# Patient Record
Sex: Female | Born: 1960 | Race: White | Hispanic: No | Smoking: Never smoker
Health system: Southern US, Community
[De-identification: ages and names within clinical notes are randomized; demographics above are authoritative.]

## PROBLEM LIST (undated history)

## (undated) DIAGNOSIS — R112 Nausea with vomiting, unspecified: Secondary | ICD-10-CM

## (undated) DIAGNOSIS — E559 Vitamin D deficiency, unspecified: Secondary | ICD-10-CM

## (undated) DIAGNOSIS — L8990 Pressure ulcer of unspecified site, unspecified stage: Secondary | ICD-10-CM

## (undated) DIAGNOSIS — J42 Unspecified chronic bronchitis: Secondary | ICD-10-CM

## (undated) DIAGNOSIS — I10 Essential (primary) hypertension: Secondary | ICD-10-CM

## (undated) DIAGNOSIS — J690 Pneumonitis due to inhalation of food and vomit: Secondary | ICD-10-CM

## (undated) DIAGNOSIS — F329 Major depressive disorder, single episode, unspecified: Secondary | ICD-10-CM

## (undated) DIAGNOSIS — F4310 Post-traumatic stress disorder, unspecified: Secondary | ICD-10-CM

## (undated) DIAGNOSIS — E7800 Pure hypercholesterolemia, unspecified: Secondary | ICD-10-CM

## (undated) DIAGNOSIS — Z8719 Personal history of other diseases of the digestive system: Secondary | ICD-10-CM

## (undated) DIAGNOSIS — K760 Fatty (change of) liver, not elsewhere classified: Secondary | ICD-10-CM

## (undated) DIAGNOSIS — D649 Anemia, unspecified: Secondary | ICD-10-CM

## (undated) DIAGNOSIS — M545 Low back pain, unspecified: Secondary | ICD-10-CM

## (undated) DIAGNOSIS — Z87442 Personal history of urinary calculi: Secondary | ICD-10-CM

## (undated) DIAGNOSIS — E872 Acidosis: Secondary | ICD-10-CM

## (undated) DIAGNOSIS — G8929 Other chronic pain: Secondary | ICD-10-CM

## (undated) DIAGNOSIS — M1990 Unspecified osteoarthritis, unspecified site: Secondary | ICD-10-CM

## (undated) DIAGNOSIS — K5790 Diverticulosis of intestine, part unspecified, without perforation or abscess without bleeding: Secondary | ICD-10-CM

## (undated) DIAGNOSIS — M810 Age-related osteoporosis without current pathological fracture: Secondary | ICD-10-CM

## (undated) DIAGNOSIS — K219 Gastro-esophageal reflux disease without esophagitis: Secondary | ICD-10-CM

## (undated) DIAGNOSIS — Q211 Atrial septal defect, unspecified: Secondary | ICD-10-CM

## (undated) DIAGNOSIS — T7840XA Allergy, unspecified, initial encounter: Secondary | ICD-10-CM

## (undated) DIAGNOSIS — F32A Depression, unspecified: Secondary | ICD-10-CM

## (undated) DIAGNOSIS — I2699 Other pulmonary embolism without acute cor pulmonale: Secondary | ICD-10-CM

## (undated) DIAGNOSIS — H9190 Unspecified hearing loss, unspecified ear: Secondary | ICD-10-CM

## (undated) DIAGNOSIS — J40 Bronchitis, not specified as acute or chronic: Secondary | ICD-10-CM

## (undated) DIAGNOSIS — T50901A Poisoning by unspecified drugs, medicaments and biological substances, accidental (unintentional), initial encounter: Secondary | ICD-10-CM

## (undated) DIAGNOSIS — N950 Postmenopausal bleeding: Secondary | ICD-10-CM

## (undated) DIAGNOSIS — M797 Fibromyalgia: Secondary | ICD-10-CM

## (undated) DIAGNOSIS — D61818 Other pancytopenia: Secondary | ICD-10-CM

## (undated) DIAGNOSIS — I700 Atherosclerosis of aorta: Secondary | ICD-10-CM

## (undated) DIAGNOSIS — I469 Cardiac arrest, cause unspecified: Secondary | ICD-10-CM

## (undated) DIAGNOSIS — J189 Pneumonia, unspecified organism: Secondary | ICD-10-CM

## (undated) DIAGNOSIS — F331 Major depressive disorder, recurrent, moderate: Secondary | ICD-10-CM

## (undated) DIAGNOSIS — K2970 Gastritis, unspecified, without bleeding: Secondary | ICD-10-CM

## (undated) DIAGNOSIS — D509 Iron deficiency anemia, unspecified: Secondary | ICD-10-CM

## (undated) DIAGNOSIS — G4730 Sleep apnea, unspecified: Secondary | ICD-10-CM

## (undated) DIAGNOSIS — G039 Meningitis, unspecified: Secondary | ICD-10-CM

## (undated) DIAGNOSIS — R579 Shock, unspecified: Secondary | ICD-10-CM

## (undated) DIAGNOSIS — R55 Syncope and collapse: Secondary | ICD-10-CM

## (undated) DIAGNOSIS — I2510 Atherosclerotic heart disease of native coronary artery without angina pectoris: Secondary | ICD-10-CM

## (undated) DIAGNOSIS — R1310 Dysphagia, unspecified: Secondary | ICD-10-CM

## (undated) DIAGNOSIS — J9601 Acute respiratory failure with hypoxia: Secondary | ICD-10-CM

## (undated) DIAGNOSIS — IMO0001 Reserved for inherently not codable concepts without codable children: Secondary | ICD-10-CM

## (undated) HISTORY — DX: Dysphagia, unspecified: R13.10

## (undated) HISTORY — DX: Allergy, unspecified, initial encounter: T78.40XA

## (undated) HISTORY — DX: Age-related osteoporosis without current pathological fracture: M81.0

## (undated) HISTORY — DX: Iron deficiency anemia, unspecified: D50.9

## (undated) HISTORY — DX: Nausea with vomiting, unspecified: R11.2

## (undated) HISTORY — DX: Major depressive disorder, single episode, unspecified: F32.9

## (undated) HISTORY — DX: Unspecified hearing loss, unspecified ear: H91.90

## (undated) HISTORY — DX: Atherosclerosis of aorta: I70.0

## (undated) HISTORY — PX: TYMPANOSTOMY TUBE PLACEMENT: SHX32

## (undated) HISTORY — DX: Reserved for inherently not codable concepts without codable children: IMO0001

## (undated) HISTORY — DX: Shock, unspecified: R57.9

## (undated) HISTORY — PX: APPENDECTOMY: SHX54

## (undated) HISTORY — DX: Pressure ulcer of unspecified site, unspecified stage: L89.90

## (undated) HISTORY — DX: Other pulmonary embolism without acute cor pulmonale: I26.99

## (undated) HISTORY — DX: Depression, unspecified: F32.A

## (undated) HISTORY — DX: Vitamin D deficiency, unspecified: E55.9

## (undated) HISTORY — DX: Other pancytopenia: D61.818

## (undated) HISTORY — DX: Postmenopausal bleeding: N95.0

## (undated) HISTORY — DX: Unspecified osteoarthritis, unspecified site: M19.90

## (undated) HISTORY — DX: Fibromyalgia: M79.7

## (undated) HISTORY — PX: TONSILLECTOMY AND ADENOIDECTOMY: SUR1326

## (undated) HISTORY — PX: EYE MUSCLE SURGERY: SHX370

## (undated) HISTORY — DX: Bronchitis, not specified as acute or chronic: J40

## (undated) HISTORY — DX: Acidosis: E87.2

## (undated) HISTORY — DX: Fatty (change of) liver, not elsewhere classified: K76.0

## (undated) HISTORY — DX: Diverticulosis of intestine, part unspecified, without perforation or abscess without bleeding: K57.90

## (undated) HISTORY — DX: Poisoning by unspecified drugs, medicaments and biological substances, accidental (unintentional), initial encounter: T50.901A

## (undated) HISTORY — DX: Syncope and collapse: R55

## (undated) HISTORY — DX: Major depressive disorder, recurrent, moderate: F33.1

## (undated) HISTORY — DX: Pneumonitis due to inhalation of food and vomit: J69.0

## (undated) HISTORY — DX: Sleep apnea, unspecified: G47.30

## (undated) HISTORY — PX: COCHLEAR IMPLANT: SUR684

## (undated) HISTORY — DX: Cardiac arrest, cause unspecified: I46.9

## (undated) HISTORY — DX: Acute respiratory failure with hypoxia: J96.01

## (undated) HISTORY — DX: Gastro-esophageal reflux disease without esophagitis: K21.9

## (undated) HISTORY — DX: Anemia, unspecified: D64.9

---
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
## 1991-05-11 HISTORY — PX: TUBAL LIGATION: SHX77

## 1991-05-11 HISTORY — PX: INCISION AND DRAINAGE OF WOUND: SHX1803

## 1998-10-26 ENCOUNTER — Emergency Department (HOSPITAL_COMMUNITY): Admission: EM | Admit: 1998-10-26 | Discharge: 1998-10-26 | Payer: Self-pay | Admitting: *Deleted

## 1999-02-28 ENCOUNTER — Inpatient Hospital Stay (HOSPITAL_COMMUNITY): Admission: AD | Admit: 1999-02-28 | Discharge: 1999-02-28 | Payer: Self-pay | Admitting: *Deleted

## 1999-03-08 ENCOUNTER — Inpatient Hospital Stay (HOSPITAL_COMMUNITY): Admission: EM | Admit: 1999-03-08 | Discharge: 1999-03-11 | Payer: Self-pay | Admitting: Emergency Medicine

## 1999-03-26 ENCOUNTER — Encounter: Admission: RE | Admit: 1999-03-26 | Discharge: 1999-03-26 | Payer: Self-pay | Admitting: Family Medicine

## 1999-04-13 ENCOUNTER — Ambulatory Visit (HOSPITAL_COMMUNITY): Admission: RE | Admit: 1999-04-13 | Discharge: 1999-04-13 | Payer: Self-pay | Admitting: Cardiology

## 2001-03-08 ENCOUNTER — Other Ambulatory Visit: Admission: RE | Admit: 2001-03-08 | Discharge: 2001-03-08 | Payer: Self-pay | Admitting: Gynecology

## 2001-05-10 DIAGNOSIS — I2699 Other pulmonary embolism without acute cor pulmonale: Secondary | ICD-10-CM

## 2001-05-10 HISTORY — DX: Other pulmonary embolism without acute cor pulmonale: I26.99

## 2001-05-30 ENCOUNTER — Ambulatory Visit (HOSPITAL_COMMUNITY): Admission: RE | Admit: 2001-05-30 | Discharge: 2001-05-30 | Payer: Self-pay | Admitting: Gynecology

## 2001-05-30 ENCOUNTER — Encounter: Payer: Self-pay | Admitting: Gynecology

## 2001-06-21 ENCOUNTER — Encounter: Admission: RE | Admit: 2001-06-21 | Discharge: 2001-09-19 | Payer: Self-pay | Admitting: Gynecology

## 2001-09-29 ENCOUNTER — Ambulatory Visit (HOSPITAL_COMMUNITY): Admission: EM | Admit: 2001-09-29 | Discharge: 2001-09-29 | Payer: Self-pay | Admitting: *Deleted

## 2002-06-08 ENCOUNTER — Other Ambulatory Visit: Admission: RE | Admit: 2002-06-08 | Discharge: 2002-06-08 | Payer: Self-pay | Admitting: Gynecology

## 2005-05-10 HISTORY — PX: MITRAL VALVE REPAIR: SHX2039

## 2006-09-16 ENCOUNTER — Emergency Department (HOSPITAL_COMMUNITY): Admission: EM | Admit: 2006-09-16 | Discharge: 2006-09-16 | Payer: Self-pay | Admitting: Emergency Medicine

## 2007-02-13 ENCOUNTER — Other Ambulatory Visit: Payer: Self-pay

## 2007-02-13 ENCOUNTER — Other Ambulatory Visit: Payer: Self-pay | Admitting: Emergency Medicine

## 2007-02-13 ENCOUNTER — Ambulatory Visit: Payer: Self-pay | Admitting: *Deleted

## 2007-02-13 ENCOUNTER — Inpatient Hospital Stay (HOSPITAL_COMMUNITY): Admission: RE | Admit: 2007-02-13 | Discharge: 2007-02-17 | Payer: Self-pay | Admitting: *Deleted

## 2007-08-24 ENCOUNTER — Emergency Department (HOSPITAL_COMMUNITY): Admission: EM | Admit: 2007-08-24 | Discharge: 2007-08-25 | Payer: Self-pay | Admitting: Emergency Medicine

## 2007-10-10 ENCOUNTER — Encounter: Payer: Self-pay | Admitting: Gastroenterology

## 2007-10-10 ENCOUNTER — Other Ambulatory Visit: Admission: RE | Admit: 2007-10-10 | Discharge: 2007-10-10 | Payer: Self-pay | Admitting: Obstetrics and Gynecology

## 2007-10-24 ENCOUNTER — Ambulatory Visit: Payer: Self-pay | Admitting: Gastroenterology

## 2007-11-01 ENCOUNTER — Telehealth (INDEPENDENT_AMBULATORY_CARE_PROVIDER_SITE_OTHER): Payer: Self-pay | Admitting: *Deleted

## 2007-11-01 ENCOUNTER — Telehealth: Payer: Self-pay | Admitting: Gastroenterology

## 2007-11-03 ENCOUNTER — Ambulatory Visit: Payer: Self-pay | Admitting: Gastroenterology

## 2007-12-24 ENCOUNTER — Emergency Department (HOSPITAL_COMMUNITY): Admission: EM | Admit: 2007-12-24 | Discharge: 2007-12-24 | Payer: Self-pay | Admitting: Emergency Medicine

## 2008-01-15 ENCOUNTER — Emergency Department (HOSPITAL_COMMUNITY): Admission: EM | Admit: 2008-01-15 | Discharge: 2008-01-15 | Payer: Self-pay | Admitting: Emergency Medicine

## 2008-01-24 ENCOUNTER — Emergency Department (HOSPITAL_COMMUNITY): Admission: EM | Admit: 2008-01-24 | Discharge: 2008-01-24 | Payer: Self-pay | Admitting: Emergency Medicine

## 2008-01-25 ENCOUNTER — Ambulatory Visit: Payer: Self-pay | Admitting: Psychiatry

## 2008-01-25 ENCOUNTER — Other Ambulatory Visit (HOSPITAL_COMMUNITY): Admission: RE | Admit: 2008-01-25 | Discharge: 2008-04-24 | Payer: Self-pay | Admitting: Psychiatry

## 2008-02-20 ENCOUNTER — Ambulatory Visit: Payer: Self-pay | Admitting: Obstetrics and Gynecology

## 2008-02-20 ENCOUNTER — Encounter: Payer: Self-pay | Admitting: Obstetrics and Gynecology

## 2008-04-23 ENCOUNTER — Emergency Department (HOSPITAL_COMMUNITY): Admission: EM | Admit: 2008-04-23 | Discharge: 2008-04-23 | Payer: Self-pay | Admitting: Emergency Medicine

## 2008-05-29 ENCOUNTER — Ambulatory Visit (HOSPITAL_COMMUNITY): Payer: Self-pay | Admitting: Psychiatry

## 2008-06-26 ENCOUNTER — Ambulatory Visit (HOSPITAL_COMMUNITY): Payer: Self-pay | Admitting: Psychiatry

## 2008-07-24 ENCOUNTER — Emergency Department (HOSPITAL_COMMUNITY): Admission: EM | Admit: 2008-07-24 | Discharge: 2008-07-24 | Payer: Self-pay | Admitting: Emergency Medicine

## 2008-08-21 ENCOUNTER — Ambulatory Visit (HOSPITAL_COMMUNITY): Payer: Self-pay | Admitting: Psychiatry

## 2008-09-18 ENCOUNTER — Ambulatory Visit (HOSPITAL_COMMUNITY): Payer: Self-pay | Admitting: Psychiatry

## 2008-10-15 ENCOUNTER — Ambulatory Visit (HOSPITAL_COMMUNITY): Payer: Self-pay | Admitting: Psychiatry

## 2008-11-15 ENCOUNTER — Ambulatory Visit: Payer: Self-pay | Admitting: Obstetrics and Gynecology

## 2008-11-15 ENCOUNTER — Other Ambulatory Visit: Admission: RE | Admit: 2008-11-15 | Discharge: 2008-11-15 | Payer: Self-pay | Admitting: Obstetrics and Gynecology

## 2008-12-04 ENCOUNTER — Ambulatory Visit (HOSPITAL_COMMUNITY): Payer: Self-pay | Admitting: Psychiatry

## 2009-02-28 ENCOUNTER — Ambulatory Visit (HOSPITAL_COMMUNITY): Payer: Self-pay | Admitting: Psychiatry

## 2009-03-10 ENCOUNTER — Encounter: Admission: RE | Admit: 2009-03-10 | Discharge: 2009-03-26 | Payer: Self-pay | Admitting: Family Medicine

## 2009-04-02 ENCOUNTER — Ambulatory Visit (HOSPITAL_COMMUNITY): Payer: Self-pay | Admitting: Psychiatry

## 2009-05-28 ENCOUNTER — Ambulatory Visit (HOSPITAL_COMMUNITY): Payer: Self-pay | Admitting: Psychiatry

## 2009-07-22 ENCOUNTER — Ambulatory Visit (HOSPITAL_COMMUNITY): Payer: Self-pay | Admitting: Psychiatry

## 2009-09-16 ENCOUNTER — Ambulatory Visit (HOSPITAL_COMMUNITY): Payer: Self-pay | Admitting: Psychiatry

## 2009-12-02 ENCOUNTER — Ambulatory Visit: Payer: Self-pay | Admitting: Obstetrics and Gynecology

## 2009-12-31 ENCOUNTER — Ambulatory Visit (HOSPITAL_COMMUNITY): Payer: Self-pay | Admitting: Psychiatry

## 2010-01-23 ENCOUNTER — Ambulatory Visit (HOSPITAL_COMMUNITY): Payer: Self-pay | Admitting: Psychiatry

## 2010-05-10 HISTORY — PX: LAPAROSCOPIC CHOLECYSTECTOMY: SUR755

## 2010-05-20 ENCOUNTER — Ambulatory Visit (HOSPITAL_COMMUNITY)
Admission: RE | Admit: 2010-05-20 | Discharge: 2010-05-20 | Payer: Self-pay | Source: Home / Self Care | Attending: Psychiatry | Admitting: Psychiatry

## 2010-07-26 ENCOUNTER — Emergency Department (HOSPITAL_COMMUNITY): Payer: Medicare Other

## 2010-07-26 ENCOUNTER — Inpatient Hospital Stay (HOSPITAL_COMMUNITY)
Admission: EM | Admit: 2010-07-26 | Discharge: 2010-07-29 | DRG: 419 | Disposition: A | Discharge status: Home / Self Care | Payer: Medicare Other | Attending: Internal Medicine | Admitting: Internal Medicine

## 2010-07-26 DIAGNOSIS — H9190 Unspecified hearing loss, unspecified ear: Secondary | ICD-10-CM | POA: Diagnosis present

## 2010-07-26 DIAGNOSIS — F411 Generalized anxiety disorder: Secondary | ICD-10-CM | POA: Diagnosis present

## 2010-07-26 DIAGNOSIS — F3289 Other specified depressive episodes: Secondary | ICD-10-CM | POA: Diagnosis present

## 2010-07-26 DIAGNOSIS — IMO0001 Reserved for inherently not codable concepts without codable children: Secondary | ICD-10-CM | POA: Diagnosis present

## 2010-07-26 DIAGNOSIS — Z7982 Long term (current) use of aspirin: Secondary | ICD-10-CM

## 2010-07-26 DIAGNOSIS — Z86711 Personal history of pulmonary embolism: Secondary | ICD-10-CM

## 2010-07-26 DIAGNOSIS — E860 Dehydration: Secondary | ICD-10-CM | POA: Diagnosis present

## 2010-07-26 DIAGNOSIS — F329 Major depressive disorder, single episode, unspecified: Secondary | ICD-10-CM | POA: Diagnosis present

## 2010-07-26 DIAGNOSIS — K8010 Calculus of gallbladder with chronic cholecystitis without obstruction: Principal | ICD-10-CM | POA: Diagnosis present

## 2010-07-26 DIAGNOSIS — Z79899 Other long term (current) drug therapy: Secondary | ICD-10-CM

## 2010-07-26 LAB — COMPREHENSIVE METABOLIC PANEL
ALT: 18 U/L (ref 0–35)
AST: 38 U/L — ABNORMAL HIGH (ref 0–37)
Albumin: 4 g/dL (ref 3.5–5.2)
Alkaline Phosphatase: 134 U/L — ABNORMAL HIGH (ref 39–117)
BUN: 16 mg/dL (ref 6–23)
CO2: 25 mEq/L (ref 19–32)
Calcium: 9 mg/dL (ref 8.4–10.5)
Chloride: 103 mEq/L (ref 96–112)
Creatinine, Ser: 0.93 mg/dL (ref 0.4–1.2)
GFR calc Af Amer: >60 mL/min (ref >60)
GFR calc non Af Amer: >60 mL/min (ref >60)
Glucose, Bld: 100 mg/dL — ABNORMAL HIGH (ref 70–99)
Potassium: 3.8 mEq/L (ref 3.5–5.1)
Sodium: 140 mEq/L (ref 135–145)
Total Bilirubin: 1.4 mg/dL — ABNORMAL HIGH (ref 0.3–1.2)
Total Protein: 7.1 g/dL (ref 6.0–8.3)

## 2010-07-26 LAB — URINE MICROSCOPIC-ADD ON

## 2010-07-26 LAB — LIPASE, BLOOD: Lipase: 43 U/L (ref 11–59)

## 2010-07-26 LAB — URINALYSIS, ROUTINE W REFLEX MICROSCOPIC
Bilirubin Urine: NEGATIVE
Ketones, ur: NEGATIVE mg/dL
Nitrite: NEGATIVE
pH: 6.5 (ref 5.0–8.0)

## 2010-07-27 LAB — DIFFERENTIAL
Basophils Absolute: 0 10*3/uL (ref 0.0–0.1)
Basophils Relative: 1 % (ref 0–1)
Neutro Abs: 1.7 10*3/uL (ref 1.7–7.7)
Neutrophils Relative %: 61 % (ref 43–77)

## 2010-07-27 LAB — COMPREHENSIVE METABOLIC PANEL
ALT: 16 U/L (ref 0–35)
AST: 30 U/L (ref 0–37)
CO2: 23 mEq/L (ref 19–32)
Chloride: 108 mEq/L (ref 96–112)
GFR calc Af Amer: >60 mL/min (ref >60)
GFR calc non Af Amer: >60 mL/min (ref >60)
Sodium: 137 mEq/L (ref 135–145)
Total Bilirubin: 0.6 mg/dL (ref 0.3–1.2)

## 2010-07-27 LAB — CBC
Hemoglobin: 11.9 g/dL — ABNORMAL LOW (ref 12.0–15.0)
RBC: 3.7 MIL/uL — ABNORMAL LOW (ref 3.87–5.11)
WBC: 2.8 10*3/uL — ABNORMAL LOW (ref 4.0–10.5)

## 2010-07-28 ENCOUNTER — Other Ambulatory Visit: Payer: Self-pay | Admitting: General Surgery

## 2010-07-28 ENCOUNTER — Inpatient Hospital Stay (HOSPITAL_COMMUNITY): Payer: Medicare Other

## 2010-07-29 NOTE — Op Note (Signed)
NAMECHONG, JANUARY                 ACCOUNT NO.:  [id number]  MEDICAL RECORD NO.:  [id number]           PATIENT TYPE:  I  LOCATION:  5120                         FACILITY:  [facility]  PHYSICIAN:  Gabrielle Dare. Janee Morn, M.D.DATE OF BIRTH:  Mar 17, 1961  DATE OF PROCEDURE:  07/28/2010 DATE OF DISCHARGE:                              OPERATIVE REPORT   PREOPERATIVE DIAGNOSIS:  Cholelithiasis and likely passed common bile duct stone.  POSTOPERATIVE DIAGNOSIS:  Cholelithiasis and likely passed common bile duct stone.  PROCEDURE:  Laparoscopic cholecystectomy with intraoperative cholangiogram.  SURGEON:  Gabrielle Dare. Janee Morn, MD  ASSISTANT:  Festus Barren, PA-C  ANESTHESIA:  General endotracheal.  HISTORY OF PRESENT ILLNESS:  Ms. Krahl is a 50 year old female who was admitted with gallbladder sludge and elevated liver function tests. Liver function tests normalized and she is brought to the operating room for cholecystectomy.  PROCEDURE IN DETAIL:  Informed consent was obtained from the patient. She was identified in the preop holding area.  She is on antibiotic regimen IV.  She was brought to the operating room.  General endotracheal anesthesia was administered by the anesthesia staff.  Her abdomen was prepped and draped in sterile fashion.  Time-out procedure was done.  Infraumbilical region was infiltrated with 0.25% Marcaine with epinephrine.  Infraumbilical incision was made.  Subcutaneous tissues were dissected down revealing the anterior fascia.  This was divided sharply along the midline and peritoneal cavity was entered under direct vision without difficulty.  A 3-0 Vicryl purse-string suture was placed on the fascial opening.  The Hasson trocar was inserted into the abdomen.  The abdomen was insufflated with carbon dioxide in a standard fashion.  Under direct vision, a 5-mm epigastric and two 5-mm lateral ports were placed.  A 0.25% Marcaine with epinephrine was used at all port  sites.  The abdomen was explored. There were some adhesions in the right midabdomen we avoided with port insertion.  The gallbladder was acutely inflamed with signs of chronic inflammation and omentum stuck on the gallbladder.  The dome of gallbladder was retracted superomedially, some filmy and more dense omental adhesions were gradually taken down revealing the body and gradually the infundibulum.  The infundibulum was retracted inferolaterally.  Some further omental adhesions were swept down and the dissection began laterally, progressed medially revealing a cystic duct and a cystic artery.  The dissection continued until critical view was obtained with good visualization between the cystic duct infundibulum of the gallbladder and liver.  Next, a clip was placed on the infundibulum cystic duct junction, small nick was in the cystic duct and cholangiogram catheter was inserted.  Intraoperative cholangiogram was obtained demonstrating no common bile duct filling defects and good flow of contrast into the duodenum.  Cholangiogram catheter was removed and three clips were placed proximally on the cystic duct, and it was divided.  Further dissection revealed the cystic artery was clipped twice proximally, once distally, and divided. The gallbladder was taken off the liver bed with Bovie cautery.  We did encounter posterior branch vessel.  This was placed proximally and divided distally with cautery and gallbladder was taken the  rest of the way from liver bed with Bovie cautery achieving excellent hemostasis.  The gallbladder was placed in EndoCatch bag and removed from the abdomen via the infraumbilical port site.  The abdomen was copiously irrigated with saline.  Cautery was used to get hemostasis on the liver bed.  The liver bed was then completely dry.  Remainder of the irrigation fluid was evacuated and it was clear.  After doing a four quadrant inspection and no other abnormalities is  being noted.  Ports were removed under direct vision. The pneumoperitoneum was released.  The infraumbilical fascia was closed by tying with 0 Vicryl purse-string suture with care not to trap any intra-abdominal contents.  All four wounds were copiously irrigated, and skin of each was closed with running 4-0 subcuticular Vicryl suture followed by Dermabond.  Sponge, needle, and instrument counts were correct.  There were no apparent complications.  The patient tolerated procedure well without apparent complication and was taken to recovery room in stable condition.     Gabrielle Dare Janee Morn, M.D.     BET/MEDQ  D:  07/28/2010  T:  07/29/2010  Job:  [id number]  cc:   Jake Bathe, MD  Electronically Signed by Violeta Gelinas M.D. on 07/29/2010 04:48:11 PM

## 2010-08-06 NOTE — H&P (Signed)
[name], [name]                 ACCOUNT NO.:  [id number]  MEDICAL RECORD NO.:  [id number]           PATIENT TYPE:  I  LOCATION:  5120                         FACILITY:  [facility]  PHYSICIAN:  Abigail Miyamoto, M.D. DATE OF BIRTH:  14-Feb-1961  DATE OF ADMISSION:  07/26/2010 DATE OF DISCHARGE:                             HISTORY & PHYSICAL   CHIEF COMPLAINT:  Epigastric abdominal pain, nausea, and vomiting.  HISTORY:  This is a 50 year old female who presents with a 3-week history of epigastric abdominal pain, nausea, and vomiting.  She reports this has been intermittent.  She has difficulty related to fatty foods. She reports she has been dizzy secondary to this.  She fell secondary to this couple of weeks ago and reports she broke several ribs on her left side.  She reports she has now had minimal discomfort from the ribs, again most of her pain is epigastrium into the right upper quadrant. She has been having diarrhea with this.  There has been no hematemesis. The pain is moderate in intensity.  She is more bothered by the nausea and vomiting.  PAST MEDICAL HISTORY:  She has extensive past medical history.  She has had open heart surgery as a child and as an adult.  For child, it was atrial septal defect and as an adult, apparently it was about repair, this was done in [place].  Her history also includes anxiety, fibromyalgia, depression, hearing loss, for which she has bilateral hearing aids.  She had a pulmonary embolism during childbirth.  She has a history of opiate abuse and alcohol abuse in the past.  She has had C- section and an appendectomy.  FAMILY HISTORY:  Significant for hypertension, diabetes.  SOCIAL HISTORY:  Socially, she does denies smoking or current alcohol use.  DRUG ALLERGIES:  VALIUM, which cause an anaphylactic reaction.  MEDICATIONS:  Abilify, Coreg, hydrochlorothiazide, Pristiq, and aspirin.  REVIEW OF SYSTEMS:  GENERAL:  Negative for fever or  chills.  PULMONARY: Negative for cough, shortness of breath or difficulty breathing. CARDIAC:  Negative for current chest pain or irregular heartbeat. ABDOMEN:  Listed as above.  Again, there was no hematemesis and there was no blood in her stool.  URINARY:  Negative for dysuria or hematuria. PSYCHIATRIC:  Positive for anxiety and depression and history of substance abuse in the past.  This history of substance abuse was obtained from the chart, not from the patient.  PHYSICAL EXAMINATION:  GENERAL:  This is a well-developed and well- nourished female who appears fairly comfortable. VITAL SIGNS:  Temperature 97.2, pulse 84, temperature 16, blood pressure is 130/85. EYES:  Anicteric.  Pupils are reactive bilaterally. ENT:  External ears and nose are normal.  Hearing, she has bilateral hearing aids and has diminished hearing.  Mucus on the lips.  Oropharynx is clear. NECK:  Supple.  Trachea is midline.  There is no thyromegaly. LUNGS:  Clear to auscultation bilaterally with normal respiratory effort.  She has a well-healed midline sternal incision. CARDIOVASCULAR:  Regular rate and rhythm.  There are no murmurs.  There is no peripheral edema. ABDOMEN:  Soft.  There  is very mild tenderness with guarding in the epigastrium and right upper quadrant.  There are no hernias.  There is no organomegaly.  There are no masses. SKIN:  Shows no rashes and no jaundice. EXTREMITIES:  Warm and well perfused.  No edema, clubbing, or cyanosis. MUSCULOSKELETAL:  Grossly intact to all four extremities. PSYCHIATRY:  From a psychiatric standpoint, she is awake, alert and oriented.  Judgment and affect currently appeared normal.  DATA REVIEWED:  The patient has a laboratory data showing to have BUN and creatinine of 16 and 0.93.  Liver function tests show an elevated bilirubin at 1.4, alkaline phosphatase is 134, AST and ALT are 38 and 18, lipase is 43.  There was on a CBC ordered.  The patient has  an ultrasound of the abdomen which shows minimal sludge in the gallbladder. There was no gallbladder wall thickening, pericholecystic fluid or sonographic Murphy sign.  Her bile duct is 4 mm.  IMPRESSION:  This is a patient with apparent symptomatic gallbladder sludge.  There is currently no evidence of cholecystitis based on the ultrasound.  Her liver function tests are, however, mildly elevated. This may be secondary to dehydration.  Again, it is difficult to say, being common; however, I do suspect this may be symptomatic gallbladder sludge.  At this point, she will be admitted to the hospital and undergo IV rehydration.  I will give her a dose of Invanz antibiotic wise and she may need a laparoscopic cholecystectomy this admission.  I will check a chest x-ray and an EKG and we may need to consult her cardiologist from [facility] preoperatively.     Abigail Miyamoto, M.D.     DB/MEDQ  D:  07/26/2010  T:  07/27/2010  Job:  [id number]  Electronically Signed by Abigail Miyamoto M.D. on 08/06/2010 09:34:14 AM

## 2010-08-06 NOTE — Discharge Summary (Signed)
  NAMEADEL, [name]                 ACCOUNT NO.:  [id number]  MEDICAL RECORD NO.:  [id number]           PATIENT TYPE:  LOCATION:                                 FACILITY:  PHYSICIAN:  Gabrielle Dare. Janee Morn, M.D.DATE OF BIRTH:  Feb 21, 1961  DATE OF ADMISSION:  07/26/2010 DATE OF DISCHARGE:                              DISCHARGE SUMMARY   DATE OF ADMISSION:  July 26, 2010.  DATE OF DISCHARGE:  July 29, 2010.  HISTORY OF PRESENT ILLNESS:  [name] is a pleasant 50 year old female who presented with complaint of abdominal pain.  She was worked up and found to have evidence of gallbladder sludge with associated symptoms. Decision was made that the patient would need inpatient management and operative management.  However, the patient did have a history of mitral valve disease with a mitral valve repair at [facility] in [place], approximately 5 or 6 years ago.  She previously had a negative stress test in 2010; however, we felt that Cardiology would be asked to evaluate the patient prior to surgical intervention.  SUMMARY OF HOSPITAL COURSE:  The patient was admitted on July 26, 2010, by Dr. Carman Ching for symptomatic cholelithiasis and probable chronic cholecystitis.  She was evaluated on March 19 by cardiology team for cardiac clearance and found to have no significant recent cardiac angina or signs of congestive heart failure.  It was felt that the patient would need no further workup prior to surgery.  Therefore, patient was taken to the operating room on March 20 and underwent laparoscopic cholecystectomy with a negative cholangiogram.  Postoperatively, she tolerated the procedure well without any significant pain, no nausea or vomiting.  She began tolerating a regular diet, and she is going to be stable for discharge home as of today, March 21.  DISCHARGE DIAGNOSES: 1. Symptomatic cholelithiasis and cholecystitis status post     laparoscopic cholecystectomy. 2. History of  mitral valve repair. 3. History of an atrial septal defect as a child.  DISCHARGE MEDICATIONS:  The patient will resume home medications including 1. Abilify 10 mg daily. 2. Aspirin 81 mg daily. 3. Coreg 25 mg twice daily. 4. Hydrochlorothiazide 12.5 mg daily. 5. Pristiq XR 100 mg daily. 6. Vitamin D 1 capsule daily. 7. She is given a prescription for Vicodin 1-2 tablets q.4-6 h. p.r.n.     pain.  She is given an appointment card to come back on April 4 at 2:45 p.m. She can certainly contact our office with questions or concerns prior to that visit.     Brayton El, PA-C   ______________________________ Gabrielle Dare. Janee Morn, M.D.    KB/MEDQ  D:  07/29/2010  T:  07/30/2010  Job:  [id number]  Electronically Signed by Brayton El  on 08/05/2010 01:51:57 PM Electronically Signed by Violeta Gelinas M.D. on 08/06/2010 01:41:13 PM

## 2010-08-18 ENCOUNTER — Encounter (HOSPITAL_COMMUNITY): Payer: Self-pay | Admitting: Physician Assistant

## 2010-08-20 ENCOUNTER — Emergency Department (HOSPITAL_COMMUNITY): Payer: Medicare Other

## 2010-08-20 ENCOUNTER — Emergency Department (HOSPITAL_COMMUNITY)
Admission: EM | Admit: 2010-08-20 | Discharge: 2010-08-21 | Disposition: A | Discharge status: Home / Self Care | Payer: Medicare Other | Attending: Emergency Medicine | Admitting: Emergency Medicine

## 2010-08-20 DIAGNOSIS — J189 Pneumonia, unspecified organism: Secondary | ICD-10-CM | POA: Insufficient documentation

## 2010-08-20 DIAGNOSIS — R112 Nausea with vomiting, unspecified: Secondary | ICD-10-CM | POA: Insufficient documentation

## 2010-08-20 DIAGNOSIS — R197 Diarrhea, unspecified: Secondary | ICD-10-CM | POA: Insufficient documentation

## 2010-08-20 DIAGNOSIS — IMO0001 Reserved for inherently not codable concepts without codable children: Secondary | ICD-10-CM | POA: Insufficient documentation

## 2010-08-20 DIAGNOSIS — K5289 Other specified noninfective gastroenteritis and colitis: Secondary | ICD-10-CM | POA: Insufficient documentation

## 2010-08-20 DIAGNOSIS — I517 Cardiomegaly: Secondary | ICD-10-CM | POA: Insufficient documentation

## 2010-08-20 DIAGNOSIS — E876 Hypokalemia: Secondary | ICD-10-CM | POA: Insufficient documentation

## 2010-08-20 DIAGNOSIS — R109 Unspecified abdominal pain: Secondary | ICD-10-CM | POA: Insufficient documentation

## 2010-08-21 LAB — DIFFERENTIAL
Basophils Absolute: 0 10*3/uL (ref 0.0–0.1)
Eosinophils Absolute: 0 10*3/uL (ref 0.0–0.7)
Eosinophils Relative: 0 % (ref 0–5)
Lymphs Abs: 0.5 10*3/uL — ABNORMAL LOW (ref 0.7–4.0)
Monocytes Absolute: 0.4 10*3/uL (ref 0.1–1.0)

## 2010-08-21 LAB — URINE MICROSCOPIC-ADD ON

## 2010-08-21 LAB — CBC
MCHC: 33.9 g/dL (ref 30.0–36.0)
MCV: 93.6 fL (ref 78.0–100.0)
Platelets: 184 10*3/uL (ref 150–400)
RDW: 15.5 % (ref 11.5–15.5)
WBC: 5.2 10*3/uL (ref 4.0–10.5)

## 2010-08-21 LAB — URINALYSIS, ROUTINE W REFLEX MICROSCOPIC
Nitrite: NEGATIVE
Protein, ur: 100 mg/dL — AB
Specific Gravity, Urine: 1.029 (ref 1.005–1.030)
Urobilinogen, UA: 1 mg/dL (ref 0.0–1.0)

## 2010-08-21 LAB — COMPREHENSIVE METABOLIC PANEL
ALT: 19 U/L (ref 0–35)
AST: 39 U/L — ABNORMAL HIGH (ref 0–37)
Albumin: 4 g/dL (ref 3.5–5.2)
Alkaline Phosphatase: 87 U/L (ref 39–117)
CO2: 21 mEq/L (ref 19–32)
Chloride: 99 mEq/L (ref 96–112)
Creatinine, Ser: 1.03 mg/dL (ref 0.4–1.2)
GFR calc Af Amer: >60 mL/min (ref >60)
GFR calc non Af Amer: 57 mL/min — ABNORMAL LOW (ref >60)
Potassium: 2.5 mEq/L — CL (ref 3.5–5.1)
Sodium: 133 mEq/L — ABNORMAL LOW (ref 135–145)
Total Bilirubin: 0.7 mg/dL (ref 0.3–1.2)

## 2010-08-23 LAB — URINE CULTURE

## 2010-08-25 ENCOUNTER — Encounter (HOSPITAL_COMMUNITY): Payer: Medicare Other | Admitting: Physician Assistant

## 2010-08-25 DIAGNOSIS — F329 Major depressive disorder, single episode, unspecified: Secondary | ICD-10-CM

## 2010-08-25 DIAGNOSIS — F411 Generalized anxiety disorder: Secondary | ICD-10-CM

## 2010-08-25 DIAGNOSIS — F3289 Other specified depressive episodes: Secondary | ICD-10-CM

## 2010-08-26 NOTE — Consult Note (Signed)
[name], [name]                 ACCOUNT NO.:  [id number]  MEDICAL RECORD NO.:  [id number]           PATIENT TYPE:  I  LOCATION:  5120                         FACILITY:  [facility]  PHYSICIAN:  Corky Crafts, MDDATE OF BIRTH:  08-Aug-1960  DATE OF CONSULTATION:  07/27/2010 DATE OF DISCHARGE:                                CONSULTATION   REFERRING PHYSICIAN:  Gabrielle Dare. Janee Morn, MD  PRIMARY CARDIOLOGIST:  Veverly Fells. Anne Fu, MD  PRIMARY CARE PHYSICIAN:  Sigmund Hazel, MD  REASON FOR CONSULTATION:  Preoperative evaluation before cholecystectomy.  HISTORY OF PRESENT ILLNESS:  The patient is a 50 year old woman who had a mitral valve repair [facility] about 5 or 6 years ago.  She has done well since that time.  In 2010, she had a negative stress test.  She does not do a lot of strenuous exercise.  She does have to walk up several flights of stairs.  She says after 1 flight of stairs she is not short of breath.  She does not describe any chest pain.  She also walks her dog and has no anginal symptoms.  Currently, she denies any chest pain or shortness of breath.  Her only complaint is that her arm is burning and she has had abdominal pain.  PAST MEDICAL HISTORY: 1. Mitral valve disease. 2. Atrial septal defect, repaired as a child. 3. Fibromyalgia. 4. Depression. 5. Pulmonary embolism during childhood. 6. Prior alcohol abuse.  PAST SURGICAL HISTORY:  Cardiac surgery as noted above with a mitral valve repair and atrial septal defect repair.  She has also had ear surgery.  FAMILY HISTORY:  Significant for hypertension and diabetes.  SOCIAL HISTORY:  She does not smoke.  She currently does not use any alcohol.  MEDICATIONS AT HOME:  Aspirin, Pristiq, hydrochlorothiazide, Abilify, Coreg.  ALLERGIES:  VALIUM, she states that she had some type of seizure and nearly died during prior surgery after receiving Valium.  REVIEW OF SYSTEMS:  Significant for the arm burning and abdominal  pain. No significant bleeding problems.  No focal weakness or rash.  All other systems negative.  PHYSICAL EXAMINATION:  VITAL SIGNS:  Blood pressure 148/89 and heart rate is 80. GENERAL:  She is awake, alert, and in no apparent distress. HEAD:  Normocephalic and atraumatic. EYES:  Extraocular movements are intact. NECK:  No JVD. CARDIOVASCULAR:  Regular rate and rhythm.  S1-S2.  No significant murmur. LUNGS:  Clear to auscultation bilaterally. ABDOMEN:  Nondistended. EXTREMITIES:  No edema. NEURO:  No focal motor or sensory deficits. SKIN:  No rash. BACK:  No kyphosis. PSYCH:  Normal mood and affect.  LABORATORY DATA:  Creatinine 0.74 and potassium 3.2.  White blood cell count 2.8 and hemoglobin 11.9.  Cardiolite stress test done in June 2010 at her cardiologist office showed no significant ischemia, breast attenuation noted, and LVEF of 72%.  Echocardiogram done in April 2011 showed normal ventricular function and only trace mitral regurgitation.  ASSESSMENT AND PLAN: 1. Preoperative evaluation.  No further cardiac evaluation is needed     before surgery.  She is not having any symptoms of angina or  congestive heart failure.  EKG today reveals normal sinus rhythm.     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  We will try to avoid     medications that might worsen the prolonged QT. 2. Mitral valve disease.  No signs of congestive heart failure.  Echo     in 2011 with normal ventricular function. 3. We will follow with you postop.     Corky Crafts, MD     JSV/MEDQ  D:  07/27/2010  T:  07/28/2010  Job:  [id number]  Electronically Signed by Lance Muss MD on 08/26/2010 02:26:29 PM

## 2010-09-22 NOTE — Discharge Summary (Signed)
[name], [name]                 ACCOUNT NO.:  [id number]   MEDICAL RECORD NO.:  [id number]          PATIENT TYPE:  IPS   LOCATION:  0300                          FACILITY:  [facility]   PHYSICIAN:  Jasmine Pang, M.D. DATE OF BIRTH:  January 24, 1961   DATE OF ADMISSION:  02/13/2007  DATE OF DISCHARGE:  02/17/2007                               DISCHARGE SUMMARY   IDENTIFICATION:  A 50 year old married white female who was admitted on  a voluntary basis on February 13, 2007.   HISTORY OF PRESENT ILLNESS:  The patient relapsed 1 week ago on alcohol  and was drinking a pint of vodka daily.  She has been missing some doses  of Effexor.  She has had dizziness with increase of Effexor dose from  225 to 300 mg.  Three weeks ago, she was overwhelmed with anxiety.  She  cannot care for herself and her 50-year-old.  Husband was arrested this  week and 16-year-old return to the patient's care.  She cannot cope, she  states.  She wants sobriety again.  She needs to stay abstinent.  She  denies rituals.  She has positive passive suicidal ideation.  She  currently sees Tami Ribas, RN, and Dr. Jules Schick at [facility].  She was in [facility] in approximately 2000.  This is the  first [facility] admission for her.  She has been diagnosed with depression  since the age of 13 with obsessive thinking.  She has a history of rehab  at [facility]  She has been married for 16 years.  One year ago,  her husband moved to [place].  She has a 48 year old son who lives  with her husband and 80-year-old daughter who had been living with her  until recently moving in with the husband.  She states her mother is  supportive.  She has a history of opiate abuse.  She has been using for  the past 30 days.  She has fibromyalgia, migraines, recent lice  infection, and she has been on Viratrol IM in the past, but she states  her insurance will not pay for it anymore.  She is on Effexor 300 mg  daily and  Excedrin for headache.  She is allergic to VALIUM (she stopped  breathing) and has some akathisia on RISPERDAL.   PHYSICAL EXAMINATION:  Physical exam was done in the ED prior to  admission.  There were no acute physical problems noted.   ADMISSION LABORATORIES:  Admission laboratories were done in the ED  prior to admission.  There were no laboratory abnormalities reported.   HOSPITAL COURSE:  Upon admission the patient was continued on aspirin 81  mg p.o. daily, Coreg 12.5 mg p.o. b.i.d., Effexor XR 150 mg p.o. daily,  Risperdal 0.5 mg p.o. t.i.d., trazodone 50 mg p.o. q.h.s., and she was  started on a Librium detox protocol.  Her Risperdal was discontinued.  Trazodone was increased to 50 mg 1-2 tablets q.h.s. p.r.n. insomnia.  She was also started on Vistaril 25 mg p.o. q. 6h. p.r.n. akathisia or  EPS.  On  February 15, 2007, Effexor XR was decreased to 75 mg p.o. daily  x2 days with the intent to decrease Effexor XR to 37.5 mg p.o. daily x2  days and then discontinue.  Celexa was started at 20 mg daily.  She was  started on Seroquel 25 mg p.o. q. 4h. p.r.n. anxiety.  On February 16, 2007, she was started on Claritin 10 mg q.a.m.  She was also ordered  some Imitrex for migraine headache.  She tolerated her medications well  with no significant side effects.  The patient was friendly and  cooperative upon admission.  She was able to talk with me on an  individual basis.  She had hearing impairment and had difficulty  understanding what was going on in groups.  She discussed her depression  and anxiety.  She states she began to drink, and her daughter had to go  live with her husband.  She states she has been depressed since the age  of 50 years old.  She has had obsessive thoughts.  No compulsive  behavior.  She had been in the rehab in the past for 28 days.  She had  been on numerous antidepressants.  As hospitalization progressed, mental  status improved.  She was able to participate  appropriately in unit  therapeutic groups and activities.  There was no suicidal ideation.  She  began to feel a little more hopeful.  She went to NA meeting and  stated she was able to participate and share in spite of her hearing  disability.  On February 17, 2007, mental status had improved markedly  from admission status.  The patient was friendly and cooperative with  good eye contact.  Speech was normal rate and flow.  Psychomotor  activity was within normal limits.  Mood euthymic.  Affect wide range.  There was no suicidal or homicidal ideation.  No thoughts of self-  injurious behavior.  No auditory or visual hallucinations.  No paranoia  or delusions.  Thoughts were logical and goal-directed.  Thought content  no predominant theme.  Cognitive was grossly back to baseline, and the  patient was felt safe to be discharged home today.   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  Also, alcohol dependence, opiate abuse, and features  of obsessive-compulsive disorder.  AXIS II:  None.  AXIS III:  Hearing loss, fibromyalgia, migraine headaches, and recent  lice infection.  AXIS IV:  Severe (issues with child care and marital support, burden of  psychiatric illness, and burden of medical problems).  AXIS V:  Global assessment of functioning upon discharge was 50.  Global  assessment of functioning upon admission was 30.  Global assessment of  functioning highest past year was 65.   DISCHARGE/PLAN:  There were no specific activity level or dietary  restrictions.   POST-HOSPITAL CARE PLANS:  The patient will see Dr. Jules Schick at [facility] on February 21, 2007, at 9:30 a.m.  She will see  Tami Ribas, her therapist, at [facility] on February 20, 2007, at 1:00 p.m.   DISCHARGE MEDICATIONS:  Coreg 12.5 mg p.o. b.i.d., aspirin 81 mg daily,  multivitamins daily as directed, thiamine 100 mg daily, Claritin 10  mg  daily, Seroquel 25 mg every 4 hours as needed for anxiety, trazodone 50  mg at bedtime may repeat dose, Effexor XR 37.5 mg x2 days then  discontinue, and Celexa 20 mg daily.  Jasmine Pang, M.D.  Electronically Signed     BHS/MEDQ  D:  02/17/2007  T:  02/18/2007  Job:  [id number]

## 2010-09-22 NOTE — H&P (Signed)
[name], [name]                 ACCOUNT NO.:  [id number]   MEDICAL RECORD NO.:  [id number]          PATIENT TYPE:  IPS   LOCATION:  0300                          FACILITY:  [facility]   PHYSICIAN:  [name], M.D. DATE OF BIRTH:  07/02/1960   DATE OF ADMISSION:  02/13/2007  DATE OF DISCHARGE:                       PSYCHIATRIC ADMISSION ASSESSMENT   IDENTIFICATION:  A 50 year old married and separated white female,  voluntary admission.   HISTORY OF PRESENT ILLNESS:  This patient presented in the emergency  room with suicidal thoughts, passive, feeling overwhelmed after her 30-  year-old daughter, [name],  was returned to her care this week.  The  patient reports that she feels overwhelmed wish all my problems would  just go away.  she reports she is not sure how to cope with all of her  anxiety.  Her husband was falsely arrested this past week, precipitating  the return of the 18-year-old daughter from the husband's custody to the  patient's custody for care.  The patient overwhelmed with the  responsibility had resumed drinking about 1 week ago and has been  drinking one pint of vodka daily after 30 days of sobriety.  She also  reports she missed several doses of her Effexor  225 mg daily and had  been unable to tolerate an increased dose to 300 mg which was increased  3 weeks ago.  She had had symptoms of dizzy spells attempting to  increase the medication.  Also had missed some doses of Risperdal which  was giving her restless leg symptoms.  She denies any active suicide  ideas and has no history of prior suicide attempts.  Says she is having  a lot of obsessive thinking, poor sleep obsessive worrying about her  responsibilities, denies any compulsive rituals, denies any  hallucinations.   PAST PSYCHIATRIC HISTORY:  The patient is currently followed by Grace Resides, RN her psychotherapist and Grace Schick, MD her psychiatrist as  an outpatient.  This is her [facility] admission to [facility].   She has a history  of one prior admission approximately 2000 to [facility] also for  detox from opiates.  She has a history  of opiate abuse and dependence  after becoming involved with pain medication for migraine headaches and  fibromyalgia several years ago and attended rehab at [facility]  and has been abstinent since that time.  She has used alcohol since age  18 with fairly steady use on and off for the past 4 years with brief  periods of abstinence including recently a 30-day period.  She had  previously received Vivitrol IM, one injection,  which she says helped  but had not been able to obtain a second dose.  She reports initial  onset of depression at age 51 when she developed obsessive thinking  about a boyfriend.  She has taken the following medications in the past:  Zoloft results unclear, Prozac good response, Effexor has caused dizzy  spells.  She has taken Lexapro and Paxil in the past and does not  remember results because she had comorbid substance abuse at  the time,  has never taken Luvox, has taken Seroquel with good results, Risperdal  with restless leg symptoms.   SOCIAL HISTORY:  The patient has been married 16 years, moved to  [place] to be near her mother and to get assistance with child care  about 1 year ago.  Most recently 25 year old son and 102-year-old daughter  have been with the husband who has been having job problems and was  arrested this past week.  Now 3-year-old daughter [name] is in the care of  the patient.  The patient's mother is supportive and is currently caring  for [name] while the patient is hospitalized.  No current legal problems.  She is unemployed, on disability for hearing loss.   FAMILY HISTORY:  Noncontributory.   ALCOHOL AND DRUG HISTORY:  History of opiate abuse and alcohol abuse as  noted above.   MEDICAL HISTORY:  The patient is followed by Grace Duffel, MD, [facility] in  [facility]. Medical problems are  fibromyalgia, migraine headache,  recent lice infestation in her scalp.   PAST MEDICAL HISTORY:  The patient is deaf and wears bilateral hearing  aids, reads lips well.  Past medical history significant for migraine  headaches, cardiomyopathy, congenital heart disease with surgical repair  and pulmonary embolism following the birth of her 42-year-old daughter.   CURRENT MEDICATIONS:  Vivitrol IM injection once monthly, last given  approximately 6 weeks ago, Effexor XR had been increased to 300 mg  daily.  The patient is actually currently taking 225 mg XR daily,  Excedrin headache for migraines, an additional migraine medication  unclear, Pepcid 20 mg daily, Risperdal 0.5 mg up to t.i.d. p.r.n. for  agitation, aspirin 81 mg daily and Coreg 12.5 mg twice a day, trazodone  50-100 mg h.s. p.r.n.   DRUG ALLERGIES:  VALIUM which has caused an anaphylactic reaction in the  past.   CURRENT PHYSICAL FINDINGS:  Well-nourished, well-developed female with  an anxious affect, is somewhat disheveled, having some nausea and loose  stools today.  No complaints of myalgias, fever or chills.  Full  physical exam done in the emergency room and is noted in the record.  On  presentation her vital signs:  Afebrile, temperature 97.1, pulse 91,  respirations 16 and blood pressure 108/84.  Mildly overweight in no  acute distress.   DIAGNOSTIC STUDIES:  Alcohol level was 133, her urine drug screen was  negative for all substances.  Chemistry sodium 143, potassium 3.9,  chloride 104, carbon dioxide 27, BUN 12, creatinine 0.93 and random  glucose 102.  Current TSH 2.569.  Her liver enzymes are within normal  limits.  SGOT 22, SGPT 19, alkaline phosphatase 70 and total bilirubin  0.7.  CBC is also unremarkable, hemoglobin 13.3, hematocrit 37.7,  platelets 214,000 and MCV is 95.5.   MENTAL STATUS EXAM:  Fully alert female, anxious affect.  Rather  disheveled, a little bit tearful, complaining of the anxiety  and now  having a little bit of epigastric pressure, nausea, some loose stools.  Has had about three loose stools this morning and denying any abdominal  pain, no shortness of breath.  Is primarily concerned about her anxiety,  feeling overwhelmed, unable to cope with the care of her daughter.  Her  mother is supportive.  She feels that the Effexor is not doing its job  and has caused her possibly to have dizzy spells and she was unable to  increase the dose to 300 mg as directed by her  psychiatrist.  She and  her husband are up permanently separated.  Affect is constricted and  tearful.  Speech normal in pace, tone and production, relevant and  articulate.  She does read lips well and wears her hearing aids  bilaterally.  Mood is anxious.  Thought process logical, coherent.  No  evidence of psychosis.  No flight of ideas, guarding or delusional  construct, positive passive suicidal ideation, wishing she could just go  to sleep or die and have everything go away, that she would not have to  deal with her issues and her family problems.  No homicidal thought.  Cognition is well-preserved.  Insight is adequate.  No acute agitation.  Remote, recent and distant memory are intact, impulse control and  judgment are a little bit guarded.   AXIS I:  Major depression recurrent, severe, EtOH abuse rule out  dependence, history of opiate abuse, currently in remission.  Rule out  OCD.  AXIS II:  Deferred  AXIS III:  Hearing loss, fibromyalgia, rule out nausea and vomiting  secondary to alcohol withdrawal, history of pulmonary embolism and  migraine headaches.  AXIS IV:  Severe issues with parenting and child care and marital  separation.  AXIS V:  Current 30 past year 76.   PLAN:  Is to voluntarily admit the patient. We started her on a Librium  protocol for the alcohol withdrawal and will continue her Effexor at 150  mg XR daily at this point.  She will receive thiamine 100 mg daily and a   CIWA score q.6 h. We are going to discontinue her Risperdal at this  point and will consider Seroquel 25 mg q.6 h p.r.n. for agitation,  trazodone 50-100 mg at bedtime and we will contact her primary care  physician for information about her  headache medication although she has had no complaints of migraine yet.  She is contracting for safety on the unit and has been interacting  appropriately with peers and staff.   Estimated length of stay is 5-7 days      Margaret A. Lorin Picket, N.P.      [name], M.D.  Electronically Signed    MAS/MEDQ  D:  02/14/2007  T:  02/14/2007  Job:  [id number]

## 2010-09-23 ENCOUNTER — Encounter (INDEPENDENT_AMBULATORY_CARE_PROVIDER_SITE_OTHER): Payer: Self-pay | Admitting: Surgery

## 2010-09-23 ENCOUNTER — Encounter (HOSPITAL_COMMUNITY): Payer: Medicare Other | Admitting: Physician Assistant

## 2010-09-23 DIAGNOSIS — F329 Major depressive disorder, single episode, unspecified: Secondary | ICD-10-CM

## 2010-09-23 DIAGNOSIS — F411 Generalized anxiety disorder: Secondary | ICD-10-CM

## 2010-09-23 DIAGNOSIS — F4320 Adjustment disorder, unspecified: Secondary | ICD-10-CM

## 2010-09-25 NOTE — Procedures (Signed)
[facility]  Patient:    [name], [name] Visit Number: [id number] MRN: [id number]          Service Type: EMS Location: ED Attending Physician:  Corlis Leak. Dictated by:   Llana Aliment. Randa Evens, M.D. Admit Date:  09/29/2001 Discharge Date: 09/29/2001                             Procedure Report  DATE OF BIRTH:  December 10, 1960.  PROCEDURE:  Esophagogastroduodenoscopy with removal of food impaction.  MEDICATIONS:  Cetacaine spray, fentanyl 12.5 mcg, Versed was not used due to reaction in pregnancy, Phenergan 12.5 mg IV.  INDICATION:  A young woman who is 50 years old and [redacted] weeks pregnant, in high risk clinic at [facility] due to cardiomyopathy, who presented with a food impaction.  She apparently had some thrush in her mouth and was given some oral tablets, which she has not as yet filled.  She was eating a [place] sandwich of beef baklava for lunch and has been able to swallow since.  The Glucagon did not help.  She spit up food, liquids, water, saliva.  In view of this, endoscopy and removal of the impaction was felt to be appropriate.  We did discuss with pharmacy, and fentanyl and Phenergan were okay to take during pregnancy.  DESCRIPTION OF PROCEDURE:  The procedure, including the potential risks and benefits and alternatives, were explained to the patient.  The patient was in the left lateral decubitus position.  We really did sedate her throat quite well.  We passed the scope, and in her esophagus was a large amount of liquid saliva.  This was suctioned out.  There was some food impaction.  It appeared to be partially in the middle of the esophagus where there might have been some signs of a yeast infection, which was what I thought it was initially; however, this washed off.  We withdrew several pieces of this and actually pulled the scope out and reinserted it a total of three times, pulling pieces of this out.  Other pieces were pulled off and  broken up and then with gentle pressure, it popped on down in the distal esophagus but appeared to hang up again briefly and then easily with the tripod retriever was pushed into the stomach.  There was a stricture at the GE junction, and we were able to enter the stomach easily.  The gastric outlet was identified and passed.  There were no ulcerations or any other abnormalities in the duodenal bulb and pyloric channel.  The antrum and body were seen well and were normal.  The fundus and cardia were seen well on retroflex view and were normal.  There was a hiatal hernia.  The diaphragm was at 40 cm, and the Z-line was seen at approximately 34 cm; thus, a 6 mm hiatal hernia with a strictured area there had the appearance of a benign stricture, with another more proximal stricture above that with the inflammation of what apparently was felt to be initially a yeast infection washed off and irrigated, and I think this was probably the Cetacaine that had stopped here because this did wash on through and it was only isolated to this area.  This appeared to be more of an extrinsic compression, may have been spasm here, and was not particularly ulcerated. The scope was withdrawn.  The patient tolerated the procedure well, was awake and alert at the  termination of the procedure.  There were no immediate complications.  ASSESSMENT: 1. Food impaction of the esophagus, removed. 2. 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  PLAN:  Will keep her on clear liquids tonight, soft foods tomorrow.  Will follow up with her OB doctor over at [place].  I will have her see me after delivery.  She will take antacids p.r.n.  Call for problems. Dictated by:   Llana Aliment. Randa Evens, M.D. Attending Physician:  Corlis Leak DD:  09/30/01 TD:  10/03/01 Job: [id number]

## 2010-10-30 ENCOUNTER — Emergency Department (HOSPITAL_COMMUNITY)
Admission: EM | Admit: 2010-10-30 | Discharge: 2010-10-30 | Disposition: A | Discharge status: Home / Self Care | Payer: Medicare Other | Attending: Emergency Medicine | Admitting: Emergency Medicine

## 2010-10-30 ENCOUNTER — Emergency Department (HOSPITAL_COMMUNITY): Payer: Medicare Other

## 2010-10-30 DIAGNOSIS — Z86711 Personal history of pulmonary embolism: Secondary | ICD-10-CM | POA: Insufficient documentation

## 2010-10-30 DIAGNOSIS — R42 Dizziness and giddiness: Secondary | ICD-10-CM | POA: Insufficient documentation

## 2010-10-30 DIAGNOSIS — Z7982 Long term (current) use of aspirin: Secondary | ICD-10-CM | POA: Insufficient documentation

## 2010-10-30 DIAGNOSIS — R110 Nausea: Secondary | ICD-10-CM | POA: Insufficient documentation

## 2010-10-30 DIAGNOSIS — Z79899 Other long term (current) drug therapy: Secondary | ICD-10-CM | POA: Insufficient documentation

## 2010-10-30 DIAGNOSIS — R5381 Other malaise: Secondary | ICD-10-CM | POA: Insufficient documentation

## 2010-10-30 DIAGNOSIS — R55 Syncope and collapse: Secondary | ICD-10-CM | POA: Insufficient documentation

## 2010-10-30 DIAGNOSIS — F341 Dysthymic disorder: Secondary | ICD-10-CM | POA: Insufficient documentation

## 2010-10-30 DIAGNOSIS — R5383 Other fatigue: Secondary | ICD-10-CM | POA: Insufficient documentation

## 2010-10-30 DIAGNOSIS — IMO0001 Reserved for inherently not codable concepts without codable children: Secondary | ICD-10-CM | POA: Insufficient documentation

## 2010-10-30 LAB — CBC
HCT: 38.7 % (ref 36.0–46.0)
MCV: 93.9 fL (ref 78.0–100.0)
Platelets: 204 10*3/uL (ref 150–400)
RBC: 4.12 MIL/uL (ref 3.87–5.11)
RDW: 13.7 % (ref 11.5–15.5)
WBC: 4.1 10*3/uL (ref 4.0–10.5)

## 2010-10-30 LAB — POCT I-STAT, CHEM 8
Chloride: 101 mEq/L (ref 96–112)
Glucose, Bld: 115 mg/dL — ABNORMAL HIGH (ref 70–99)
HCT: 41 % (ref 36.0–46.0)
Hemoglobin: 13.9 g/dL (ref 12.0–15.0)
Potassium: 4.1 mEq/L (ref 3.5–5.1)

## 2010-10-30 LAB — URINALYSIS, ROUTINE W REFLEX MICROSCOPIC
Glucose, UA: NEGATIVE mg/dL
Hgb urine dipstick: NEGATIVE
Ketones, ur: NEGATIVE mg/dL
Leukocytes, UA: NEGATIVE
pH: 6.5 (ref 5.0–8.0)

## 2010-10-30 LAB — DIFFERENTIAL
Basophils Absolute: 0.1 10*3/uL (ref 0.0–0.1)
Eosinophils Absolute: 0 10*3/uL (ref 0.0–0.7)
Eosinophils Relative: 1 % (ref 0–5)
Lymphocytes Relative: 21 % (ref 12–46)
Lymphs Abs: 0.9 10*3/uL (ref 0.7–4.0)
Neutrophils Relative %: 69 % (ref 43–77)

## 2010-10-30 LAB — CK TOTAL AND CKMB (NOT AT ARMC): Total CK: 43 U/L (ref 7–177)

## 2010-11-11 ENCOUNTER — Emergency Department (HOSPITAL_COMMUNITY)
Admission: EM | Admit: 2010-11-11 | Discharge: 2010-11-11 | Disposition: A | Discharge status: Nursing Home (Includes ICF) | Payer: Medicare Other | Attending: Emergency Medicine | Admitting: Emergency Medicine

## 2010-11-11 ENCOUNTER — Emergency Department (HOSPITAL_COMMUNITY): Payer: Medicare Other

## 2010-11-11 DIAGNOSIS — Z86718 Personal history of other venous thrombosis and embolism: Secondary | ICD-10-CM | POA: Insufficient documentation

## 2010-11-11 DIAGNOSIS — R059 Cough, unspecified: Secondary | ICD-10-CM | POA: Insufficient documentation

## 2010-11-11 DIAGNOSIS — J069 Acute upper respiratory infection, unspecified: Secondary | ICD-10-CM | POA: Insufficient documentation

## 2010-11-11 DIAGNOSIS — R509 Fever, unspecified: Secondary | ICD-10-CM | POA: Insufficient documentation

## 2010-11-11 DIAGNOSIS — R109 Unspecified abdominal pain: Secondary | ICD-10-CM | POA: Insufficient documentation

## 2010-11-11 DIAGNOSIS — R1110 Vomiting, unspecified: Secondary | ICD-10-CM | POA: Insufficient documentation

## 2010-11-11 DIAGNOSIS — J3489 Other specified disorders of nose and nasal sinuses: Secondary | ICD-10-CM | POA: Insufficient documentation

## 2010-11-11 DIAGNOSIS — IMO0001 Reserved for inherently not codable concepts without codable children: Secondary | ICD-10-CM | POA: Insufficient documentation

## 2010-11-11 DIAGNOSIS — B9789 Other viral agents as the cause of diseases classified elsewhere: Secondary | ICD-10-CM | POA: Insufficient documentation

## 2010-11-11 DIAGNOSIS — F341 Dysthymic disorder: Secondary | ICD-10-CM | POA: Insufficient documentation

## 2010-11-11 DIAGNOSIS — R070 Pain in throat: Secondary | ICD-10-CM | POA: Insufficient documentation

## 2010-11-11 DIAGNOSIS — R05 Cough: Secondary | ICD-10-CM | POA: Insufficient documentation

## 2010-11-11 DIAGNOSIS — H9209 Otalgia, unspecified ear: Secondary | ICD-10-CM | POA: Insufficient documentation

## 2010-11-11 LAB — BASIC METABOLIC PANEL
BUN: 20 mg/dL (ref 6–23)
CO2: 28 mEq/L (ref 19–32)
Chloride: 98 mEq/L (ref 96–112)
Creatinine, Ser: 0.81 mg/dL (ref 0.50–1.10)
Glucose, Bld: 104 mg/dL — ABNORMAL HIGH (ref 70–99)

## 2010-11-11 LAB — CBC
HCT: 37.4 % (ref 36.0–46.0)
MCV: 95.2 fL (ref 78.0–100.0)
RBC: 3.93 MIL/uL (ref 3.87–5.11)
WBC: 9.5 10*3/uL (ref 4.0–10.5)

## 2010-11-11 LAB — DIFFERENTIAL
Eosinophils Relative: 1 % (ref 0–5)
Lymphocytes Relative: 9 % — ABNORMAL LOW (ref 12–46)
Lymphs Abs: 0.9 10*3/uL (ref 0.7–4.0)
Neutrophils Relative %: 82 % — ABNORMAL HIGH (ref 43–77)

## 2010-11-24 ENCOUNTER — Encounter (HOSPITAL_COMMUNITY): Payer: Medicare Other | Admitting: Physician Assistant

## 2010-11-24 DIAGNOSIS — F329 Major depressive disorder, single episode, unspecified: Secondary | ICD-10-CM

## 2010-11-24 DIAGNOSIS — F3289 Other specified depressive episodes: Secondary | ICD-10-CM

## 2010-11-24 DIAGNOSIS — F411 Generalized anxiety disorder: Secondary | ICD-10-CM

## 2010-11-29 ENCOUNTER — Emergency Department (HOSPITAL_COMMUNITY): Payer: Medicare Other

## 2010-11-29 ENCOUNTER — Emergency Department (HOSPITAL_COMMUNITY)
Admission: EM | Admit: 2010-11-29 | Discharge: 2010-11-29 | Disposition: A | Discharge status: Home / Self Care | Payer: Medicare Other | Attending: Emergency Medicine | Admitting: Emergency Medicine

## 2010-11-29 DIAGNOSIS — Z79899 Other long term (current) drug therapy: Secondary | ICD-10-CM | POA: Insufficient documentation

## 2010-11-29 DIAGNOSIS — R112 Nausea with vomiting, unspecified: Secondary | ICD-10-CM | POA: Insufficient documentation

## 2010-11-29 DIAGNOSIS — Z86718 Personal history of other venous thrombosis and embolism: Secondary | ICD-10-CM | POA: Insufficient documentation

## 2010-11-29 DIAGNOSIS — G43909 Migraine, unspecified, not intractable, without status migrainosus: Secondary | ICD-10-CM | POA: Insufficient documentation

## 2010-11-29 DIAGNOSIS — F341 Dysthymic disorder: Secondary | ICD-10-CM | POA: Insufficient documentation

## 2010-11-29 DIAGNOSIS — R109 Unspecified abdominal pain: Secondary | ICD-10-CM | POA: Insufficient documentation

## 2010-11-29 DIAGNOSIS — Z7982 Long term (current) use of aspirin: Secondary | ICD-10-CM | POA: Insufficient documentation

## 2010-11-29 LAB — DIFFERENTIAL
Eosinophils Absolute: 0 10*3/uL (ref 0.0–0.7)
Eosinophils Relative: 1 % (ref 0–5)
Lymphs Abs: 1.1 10*3/uL (ref 0.7–4.0)
Monocytes Relative: 8 % (ref 3–12)

## 2010-11-29 LAB — COMPREHENSIVE METABOLIC PANEL
Albumin: 3.9 g/dL (ref 3.5–5.2)
BUN: 22 mg/dL (ref 6–23)
Chloride: 100 mEq/L (ref 96–112)
Creatinine, Ser: 0.79 mg/dL (ref 0.50–1.10)
GFR calc Af Amer: >60 mL/min (ref >60)
Total Bilirubin: 0.5 mg/dL (ref 0.3–1.2)

## 2010-11-29 LAB — URINALYSIS, ROUTINE W REFLEX MICROSCOPIC
Ketones, ur: NEGATIVE mg/dL
Leukocytes, UA: NEGATIVE
Nitrite: NEGATIVE
Protein, ur: NEGATIVE mg/dL
Urobilinogen, UA: 0.2 mg/dL (ref 0.0–1.0)
pH: 5.5 (ref 5.0–8.0)

## 2010-11-29 LAB — LIPASE, BLOOD: Lipase: 40 U/L (ref 11–59)

## 2010-11-29 LAB — CBC
MCH: 32.3 pg (ref 26.0–34.0)
MCV: 93.5 fL (ref 78.0–100.0)
Platelets: 224 10*3/uL (ref 150–400)
RBC: 4.61 MIL/uL (ref 3.87–5.11)
RDW: 14 % (ref 11.5–15.5)

## 2010-11-29 LAB — URINE MICROSCOPIC-ADD ON

## 2010-12-22 ENCOUNTER — Encounter (HOSPITAL_COMMUNITY): Payer: Medicare Other | Admitting: Physician Assistant

## 2010-12-22 DIAGNOSIS — F411 Generalized anxiety disorder: Secondary | ICD-10-CM

## 2010-12-22 DIAGNOSIS — F4320 Adjustment disorder, unspecified: Secondary | ICD-10-CM

## 2010-12-22 DIAGNOSIS — F329 Major depressive disorder, single episode, unspecified: Secondary | ICD-10-CM

## 2011-02-02 LAB — BASIC METABOLIC PANEL
BUN: 13
CO2: 27
Calcium: 9
GFR calc non Af Amer: >60
Glucose, Bld: 94

## 2011-02-02 LAB — RAPID URINE DRUG SCREEN, HOSP PERFORMED
Amphetamines: NOT DETECTED
Benzodiazepines: NOT DETECTED
Cocaine: NOT DETECTED
Opiates: NOT DETECTED
Tetrahydrocannabinol: NOT DETECTED

## 2011-02-02 LAB — DIFFERENTIAL
Basophils Absolute: 0
Basophils Relative: 1
Eosinophils Relative: 3
Lymphocytes Relative: 32
Neutro Abs: 3

## 2011-02-02 LAB — CBC
MCHC: 34.9
Platelets: 230
RDW: 13.3

## 2011-02-08 LAB — DIFFERENTIAL
Basophils Relative: 0
Eosinophils Absolute: 0.3
Monocytes Absolute: 0.5
Monocytes Relative: 8
Neutro Abs: 4.8

## 2011-02-08 LAB — URINE DRUGS OF ABUSE SCREEN W ALC, ROUTINE (REF LAB)
Barbiturate Quant, Ur: NEGATIVE
Benzodiazepines.: NEGATIVE
Creatinine,U: 150.6 mg/dL
Ethyl Alcohol: <10 mg/dL (ref <10)
Marijuana Metabolite: NEGATIVE
Opiate Screen, Urine: NEGATIVE
Phencyclidine (PCP): NEGATIVE

## 2011-02-08 LAB — URINALYSIS, ROUTINE W REFLEX MICROSCOPIC
Bilirubin Urine: NEGATIVE
Glucose, UA: NEGATIVE
Ketones, ur: NEGATIVE
pH: 8

## 2011-02-08 LAB — URINE MICROSCOPIC-ADD ON

## 2011-02-08 LAB — CBC
Hemoglobin: 13.9
MCHC: 34.5
MCV: 95
RBC: 4.24

## 2011-02-08 LAB — RAPID URINE DRUG SCREEN, HOSP PERFORMED
Barbiturates: NOT DETECTED
Opiates: NOT DETECTED

## 2011-02-08 LAB — POCT I-STAT, CHEM 8
Calcium, Ion: 1.14
Chloride: 108
Creatinine, Ser: 1.2
Glucose, Bld: 123 — ABNORMAL HIGH
HCT: 41

## 2011-02-09 LAB — URINE DRUGS OF ABUSE SCREEN W ALC, ROUTINE (REF LAB)
Barbiturate Quant, Ur: NEGATIVE
Barbiturate Quant, Ur: NEGATIVE
Barbiturate Quant, Ur: NEGATIVE
Barbiturate Quant, Ur: NEGATIVE
Barbiturate Quant, Ur: NEGATIVE
Barbiturate Quant, Ur: NEGATIVE
Benzodiazepines.: NEGATIVE
Benzodiazepines.: NEGATIVE
Benzodiazepines.: NEGATIVE
Benzodiazepines.: NEGATIVE
Benzodiazepines.: NEGATIVE
Benzodiazepines.: NEGATIVE
Ethyl Alcohol: <10 mg/dL (ref <10)
Ethyl Alcohol: <10 mg/dL (ref <10)
Ethyl Alcohol: <10 mg/dL (ref <10)
Marijuana Metabolite: NEGATIVE
Marijuana Metabolite: NEGATIVE
Marijuana Metabolite: NEGATIVE
Methadone: NEGATIVE
Methadone: NEGATIVE
Methadone: NEGATIVE
Methadone: NEGATIVE
Methadone: NEGATIVE
Phencyclidine (PCP): NEGATIVE
Phencyclidine (PCP): NEGATIVE
Phencyclidine (PCP): NEGATIVE
Phencyclidine (PCP): NEGATIVE
Phencyclidine (PCP): NEGATIVE
Phencyclidine (PCP): NEGATIVE
Propoxyphene: NEGATIVE
Propoxyphene: NEGATIVE
Propoxyphene: NEGATIVE
Propoxyphene: NEGATIVE

## 2011-02-10 LAB — URINALYSIS, ROUTINE W REFLEX MICROSCOPIC
Glucose, UA: NEGATIVE
Ketones, ur: 40 — AB
Nitrite: NEGATIVE
Specific Gravity, Urine: 1.018
pH: 7.5

## 2011-02-10 LAB — URINE MICROSCOPIC-ADD ON

## 2011-02-12 LAB — URINALYSIS, ROUTINE W REFLEX MICROSCOPIC
Bilirubin Urine: NEGATIVE
Nitrite: NEGATIVE
Specific Gravity, Urine: 1.034 — ABNORMAL HIGH (ref 1.005–1.030)
Urobilinogen, UA: 0.2 mg/dL (ref 0.0–1.0)
pH: 5.5 (ref 5.0–8.0)

## 2011-02-12 LAB — URINE DRUGS OF ABUSE SCREEN W ALC, ROUTINE (REF LAB)
Amphetamine Screen, Ur: NEGATIVE
Amphetamine Screen, Ur: NEGATIVE
Barbiturate Quant, Ur: NEGATIVE
Barbiturate Quant, Ur: NEGATIVE
Barbiturate Quant, Ur: NEGATIVE
Creatinine,U: 137.9 mg/dL
Creatinine,U: 155.5 mg/dL
Creatinine,U: 53.7 mg/dL
Methadone: NEGATIVE
Methadone: NEGATIVE
Methadone: NEGATIVE
Phencyclidine (PCP): NEGATIVE
Phencyclidine (PCP): NEGATIVE
Propoxyphene: NEGATIVE
Propoxyphene: NEGATIVE

## 2011-02-12 LAB — CBC
MCHC: 35 g/dL (ref 30.0–36.0)
MCV: 95.5 fL (ref 78.0–100.0)
Platelets: 278 10*3/uL (ref 150–400)
RBC: 4.44 MIL/uL (ref 3.87–5.11)
RDW: 15.1 % (ref 11.5–15.5)

## 2011-02-12 LAB — DIFFERENTIAL
Basophils Absolute: 0 10*3/uL (ref 0.0–0.1)
Basophils Relative: 0 % (ref 0–1)
Eosinophils Absolute: 0.1 10*3/uL (ref 0.0–0.7)
Neutro Abs: 6.1 10*3/uL (ref 1.7–7.7)
Neutrophils Relative %: 82 % — ABNORMAL HIGH (ref 43–77)

## 2011-02-12 LAB — POCT I-STAT, CHEM 8
Chloride: 107 mEq/L (ref 96–112)
Glucose, Bld: 98 mg/dL (ref 70–99)
HCT: 45 % (ref 36.0–46.0)
Hemoglobin: 15.3 g/dL — ABNORMAL HIGH (ref 12.0–15.0)
Potassium: 4.8 mEq/L (ref 3.5–5.1)
Sodium: 137 mEq/L (ref 135–145)

## 2011-02-12 LAB — URINE MICROSCOPIC-ADD ON

## 2011-02-18 LAB — DIFFERENTIAL
Basophils Absolute: 0
Lymphocytes Relative: 21
Neutro Abs: 3.4
Neutrophils Relative %: 72

## 2011-02-18 LAB — RAPID URINE DRUG SCREEN, HOSP PERFORMED
Amphetamines: NOT DETECTED
Barbiturates: NOT DETECTED
Benzodiazepines: NOT DETECTED
Cocaine: NOT DETECTED
Opiates: NOT DETECTED

## 2011-02-18 LAB — HEPATIC FUNCTION PANEL
ALT: 27
AST: 28
Albumin: 4.1
Total Protein: 6.8

## 2011-02-18 LAB — BASIC METABOLIC PANEL
BUN: 12
CO2: 27
Chloride: 104
Creatinine, Ser: 0.93
Glucose, Bld: 102 — ABNORMAL HIGH

## 2011-02-18 LAB — COMPREHENSIVE METABOLIC PANEL
Albumin: 3.5
Alkaline Phosphatase: 70
BUN: 7
Chloride: 101
Glucose, Bld: 107 — ABNORMAL HIGH
Potassium: 3.7
Total Bilirubin: 0.7

## 2011-02-18 LAB — CBC
Platelets: 214
RDW: 14.3 — ABNORMAL HIGH

## 2011-02-23 ENCOUNTER — Encounter (INDEPENDENT_AMBULATORY_CARE_PROVIDER_SITE_OTHER): Payer: Medicare Other | Admitting: Physician Assistant

## 2011-02-23 DIAGNOSIS — F411 Generalized anxiety disorder: Secondary | ICD-10-CM

## 2011-02-23 DIAGNOSIS — F329 Major depressive disorder, single episode, unspecified: Secondary | ICD-10-CM

## 2011-03-08 ENCOUNTER — Encounter: Payer: Self-pay | Admitting: Gynecology

## 2011-03-08 DIAGNOSIS — G43909 Migraine, unspecified, not intractable, without status migrainosus: Secondary | ICD-10-CM | POA: Insufficient documentation

## 2011-03-08 DIAGNOSIS — H9190 Unspecified hearing loss, unspecified ear: Secondary | ICD-10-CM | POA: Insufficient documentation

## 2011-03-08 DIAGNOSIS — M1990 Unspecified osteoarthritis, unspecified site: Secondary | ICD-10-CM | POA: Insufficient documentation

## 2011-03-08 DIAGNOSIS — IMO0001 Reserved for inherently not codable concepts without codable children: Secondary | ICD-10-CM | POA: Insufficient documentation

## 2011-03-16 ENCOUNTER — Ambulatory Visit (INDEPENDENT_AMBULATORY_CARE_PROVIDER_SITE_OTHER): Payer: Medicare Other | Admitting: Obstetrics and Gynecology

## 2011-03-16 ENCOUNTER — Other Ambulatory Visit (HOSPITAL_COMMUNITY)
Admission: RE | Admit: 2011-03-16 | Discharge: 2011-03-16 | Disposition: A | Discharge status: Home / Self Care | Payer: Medicare Other | Source: Ambulatory Visit | Attending: Obstetrics and Gynecology | Admitting: Obstetrics and Gynecology

## 2011-03-16 ENCOUNTER — Encounter: Payer: Self-pay | Admitting: Obstetrics and Gynecology

## 2011-03-16 ENCOUNTER — Encounter: Payer: Medicare Other | Admitting: Obstetrics and Gynecology

## 2011-03-16 VITALS — BP 120/76 | Ht 64.0 in | Wt 155.0 lb

## 2011-03-16 DIAGNOSIS — R3915 Urgency of urination: Secondary | ICD-10-CM

## 2011-03-16 DIAGNOSIS — Z124 Encounter for screening for malignant neoplasm of cervix: Secondary | ICD-10-CM | POA: Insufficient documentation

## 2011-03-16 DIAGNOSIS — R350 Frequency of micturition: Secondary | ICD-10-CM

## 2011-03-16 DIAGNOSIS — I428 Other cardiomyopathies: Secondary | ICD-10-CM

## 2011-03-16 DIAGNOSIS — N951 Menopausal and female climacteric states: Secondary | ICD-10-CM

## 2011-03-16 DIAGNOSIS — Z780 Asymptomatic menopausal state: Secondary | ICD-10-CM

## 2011-03-16 DIAGNOSIS — I429 Cardiomyopathy, unspecified: Secondary | ICD-10-CM | POA: Insufficient documentation

## 2011-03-16 DIAGNOSIS — N952 Postmenopausal atrophic vaginitis: Secondary | ICD-10-CM

## 2011-03-16 NOTE — Progress Notes (Signed)
Subjective:     Patient ID: [name], female   DOB: [birth year], 50 y.o.   MRN: [id number]  HPIthe patient came back to see me today for further followup. She still having some hot flashes. She thinks she'll be okay without medication. She also has some vaginal dryness as well. Once again she feels she can get by without medication. She is due for her mammogram this month. Her previous postmenopausal bleeding with endometrial polyp has resolved since she had a polyp removed. She's having no pelvic pain. We treated her last year for urinary urgency and frequency. She is no longer on her medication and feels she is a well without it. She is due for a bone density having never had one.   Review of Systems  Constitutional: Negative.   Respiratory: Positive for shortness of breath.   Gastrointestinal:       Difficulty swallowing, heartburn  Musculoskeletal: Positive for myalgias, joint swelling and arthralgias.  Skin: Negative.   Neurological: Positive for headaches.  Psychiatric/Behavioral:       Depression under treatment       Objective:   Physical ExamPhysical examination: Kennon Portela present HEENT within normal limits. Neck: Thyroid not large. No masses. Supraclavicular nodes: not enlarged. Breasts: Examined in both sitting midline position. No skin changes and no masses. Abdomen: Soft no guarding rebound or masses or hernia. Pelvic: External: Within normal limits. BUS: Within normal limits. Vaginal:within normal limits. poor estrogen effect. No evidence of cystocele rectocele or enterocele. Cervix: clean. Uterus: Normal size and shape. Adnexa: No masses. Rectovaginal exam: Confirmatory and negative. Extremities: Within normal limits.     Assessment:     #1. Menopausal symptoms #2. Atrophic vaginitis #3. Postmenopausal bleeding now resolved #4. 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    Plan:     Continued observation of the above problems. Mammogram. Bone density.

## 2011-03-29 ENCOUNTER — Other Ambulatory Visit: Payer: Self-pay | Admitting: Obstetrics and Gynecology

## 2011-03-29 DIAGNOSIS — Z13820 Encounter for screening for osteoporosis: Secondary | ICD-10-CM

## 2011-03-31 ENCOUNTER — Encounter: Payer: Self-pay | Admitting: Obstetrics and Gynecology

## 2011-05-18 ENCOUNTER — Telehealth: Payer: Self-pay | Admitting: *Deleted

## 2011-05-18 MED ORDER — FLUCONAZOLE 150 MG PO TABS: 150.0000 mg | ORAL_TABLET | Freq: Every day | ORAL | Status: AC

## 2011-05-18 NOTE — Telephone Encounter (Signed)
difluocan 150 mg  Daily for 3 days.

## 2011-05-18 NOTE — Telephone Encounter (Signed)
Pt said that she would like diflucan pill, vaginal cream doesn't help her okay to give diflucan?

## 2011-05-18 NOTE — Telephone Encounter (Signed)
Addended by: Aura Camps on: 05/18/2011 11:19 AM   Modules accepted: Orders

## 2011-05-18 NOTE — Telephone Encounter (Signed)
Pt called c/o yeast infection x couple of days now, she has lots of itching, very thick white discharge. Pt has tried OTC medication and show no relief. Pt would like rx. Last office visit 03/16/11 Please advise

## 2011-05-18 NOTE — Telephone Encounter (Signed)
Pt informed with the below note. rx sent to pharmacy 

## 2011-05-18 NOTE — Telephone Encounter (Signed)
Terconazole 3 cream. 

## 2011-05-23 DIAGNOSIS — A088 Other specified intestinal infections: Secondary | ICD-10-CM | POA: Diagnosis not present

## 2011-05-26 ENCOUNTER — Ambulatory Visit (INDEPENDENT_AMBULATORY_CARE_PROVIDER_SITE_OTHER): Payer: Medicare Other | Admitting: Physician Assistant

## 2011-05-26 DIAGNOSIS — F411 Generalized anxiety disorder: Secondary | ICD-10-CM | POA: Diagnosis not present

## 2011-05-26 DIAGNOSIS — F3289 Other specified depressive episodes: Secondary | ICD-10-CM | POA: Diagnosis not present

## 2011-05-26 DIAGNOSIS — F329 Major depressive disorder, single episode, unspecified: Secondary | ICD-10-CM | POA: Diagnosis not present

## 2011-05-26 MED ORDER — DESVENLAFAXINE SUCCINATE ER 100 MG PO TB24: 100.0000 mg | ORAL_TABLET | Freq: Every day | ORAL | Status: DC

## 2011-05-26 MED ORDER — ARIPIPRAZOLE 10 MG PO TABS: 10.0000 mg | ORAL_TABLET | Freq: Every day | ORAL | Status: DC

## 2011-05-26 NOTE — Progress Notes (Signed)
   [facility] Behavioral Health Follow-up Outpatient Visit  BEATA BEASON 22-Feb-1961  Date: 05/26/11   Subjective: [name] presents today to followup on her medication for depression and anxiety. She reports that she has been sick with bronchitis and a stomach flu recently. She reports that she has been well emotionally until about one week ago, when she stopped hearing from a man she was in an online relationship with. She met this man on face book and has been communicating by fax and telephone for a couple of months now. She reports that she has not heard from him, nor been able to reach him for over one week now. This has her very anxious and she fears the worst. She denies any suicidal or homicidal ideation. She denies any auditory or visual hallucinations. She reports that her relationship with her children and their father is good. She continues to remain abstinent from alcohol, and is working with her sponsor.  There were no vitals filed for this visit.  Mental Status Examination  Appearance: Well groomed and dressed Alert: Yes Attention: good  Cooperative: Yes Eye Contact: Good Speech: Clear and even Psychomotor Activity: Normal Memory/Concentration: Intact Oriented: person, place, time/date and situation Mood: Anxious and Depressed Affect: Constricted Thought Processes and Associations: Linear Fund of Knowledge: Fair Thought Content:  Insight: Fair Judgement: Fair  Diagnosis: Depressive disorder, NOS, and generalized anxiety disorder  Treatment Plan: We will continue her medications as prescribed. She will continue to meet with her therapist, Evelena Peat, on a regular basis, and she is encouraged to continue to work with her sponsor. I will have her return for followup in one month.  Devante Capano, PA

## 2011-06-01 DIAGNOSIS — F319 Bipolar disorder, unspecified: Secondary | ICD-10-CM | POA: Diagnosis not present

## 2011-06-24 ENCOUNTER — Ambulatory Visit (HOSPITAL_COMMUNITY): Payer: Medicare Other | Admitting: Physician Assistant

## 2011-07-05 ENCOUNTER — Ambulatory Visit (INDEPENDENT_AMBULATORY_CARE_PROVIDER_SITE_OTHER): Payer: Medicare Other | Admitting: Physician Assistant

## 2011-07-05 DIAGNOSIS — F3289 Other specified depressive episodes: Secondary | ICD-10-CM | POA: Diagnosis not present

## 2011-07-05 DIAGNOSIS — F411 Generalized anxiety disorder: Secondary | ICD-10-CM | POA: Diagnosis not present

## 2011-07-05 DIAGNOSIS — F329 Major depressive disorder, single episode, unspecified: Secondary | ICD-10-CM | POA: Diagnosis not present

## 2011-07-05 MED ORDER — DESVENLAFAXINE SUCCINATE ER 100 MG PO TB24: 100.0000 mg | ORAL_TABLET | Freq: Every day | ORAL | Status: DC

## 2011-07-05 NOTE — Progress Notes (Signed)
   [facility] Behavioral Health Follow-up Outpatient Visit  TASHINA CREDIT 04/22/1961  Date: 07/05/11   Subjective: Avila reports that she is doing "all right." She continues to struggle with the loss of the relationship with the man in [place] on [employer]. She was excited to report she picked up her 1 year sobriety chip this past weekend. She has also gotten a new kitten for her 51-year-old daughter. She reports that she has been sleeping well except this past weekend when she tried doing some research online about her lost relationship. She plans to begin a new diet that promises she will lose 2 dress sizes and 6 weeks. She denies any suicidal or homicidal ideation she denies any auditory or visual hallucinations.  There were no vitals filed for this visit. 158 pounds  Mental Status Examination  Appearance: Well groomed and casually dressed Alert: Yes Attention: good  Cooperative: Yes Eye Contact: Good Speech: Clear and even Psychomotor Activity: Normal Memory/Concentration: Intact Oriented: person, place, time/date and situation Mood: Depressed Affect: Constricted Thought Processes and Associations: Circumstantial and Logical Fund of Knowledge: Good Thought Content:  Insight: Fair Judgement: Good  Diagnosis: Depressive disorder recurrent moderate, generalized anxiety disorder.  Treatment Plan: We will continue her medications as prescribed and have her followup in 2 months.  Joanathan Affeldt, PA

## 2011-07-13 DIAGNOSIS — F319 Bipolar disorder, unspecified: Secondary | ICD-10-CM | POA: Diagnosis not present

## 2011-07-20 DIAGNOSIS — M545 Low back pain, unspecified: Secondary | ICD-10-CM | POA: Diagnosis not present

## 2011-07-20 DIAGNOSIS — M79609 Pain in unspecified limb: Secondary | ICD-10-CM | POA: Diagnosis not present

## 2011-07-22 DIAGNOSIS — F319 Bipolar disorder, unspecified: Secondary | ICD-10-CM | POA: Diagnosis not present

## 2011-07-31 ENCOUNTER — Other Ambulatory Visit (HOSPITAL_COMMUNITY): Payer: Self-pay | Admitting: Physician Assistant

## 2011-08-10 DIAGNOSIS — F319 Bipolar disorder, unspecified: Secondary | ICD-10-CM | POA: Diagnosis not present

## 2011-08-11 ENCOUNTER — Ambulatory Visit: Payer: Medicare Other | Attending: Family Medicine | Admitting: Physical Therapy

## 2011-08-11 DIAGNOSIS — M2569 Stiffness of other specified joint, not elsewhere classified: Secondary | ICD-10-CM | POA: Diagnosis not present

## 2011-08-11 DIAGNOSIS — IMO0001 Reserved for inherently not codable concepts without codable children: Secondary | ICD-10-CM | POA: Insufficient documentation

## 2011-08-11 DIAGNOSIS — M545 Low back pain, unspecified: Secondary | ICD-10-CM | POA: Diagnosis not present

## 2011-08-18 ENCOUNTER — Ambulatory Visit: Payer: Medicare Other | Admitting: Physical Therapy

## 2011-08-18 DIAGNOSIS — M545 Low back pain, unspecified: Secondary | ICD-10-CM | POA: Diagnosis not present

## 2011-08-18 DIAGNOSIS — M25579 Pain in unspecified ankle and joints of unspecified foot: Secondary | ICD-10-CM | POA: Diagnosis not present

## 2011-08-18 DIAGNOSIS — IMO0001 Reserved for inherently not codable concepts without codable children: Secondary | ICD-10-CM | POA: Diagnosis not present

## 2011-08-18 DIAGNOSIS — M2010 Hallux valgus (acquired), unspecified foot: Secondary | ICD-10-CM | POA: Diagnosis not present

## 2011-08-18 DIAGNOSIS — M2569 Stiffness of other specified joint, not elsewhere classified: Secondary | ICD-10-CM | POA: Diagnosis not present

## 2011-08-18 DIAGNOSIS — L280 Lichen simplex chronicus: Secondary | ICD-10-CM | POA: Diagnosis not present

## 2011-08-22 DIAGNOSIS — G43909 Migraine, unspecified, not intractable, without status migrainosus: Secondary | ICD-10-CM | POA: Diagnosis not present

## 2011-08-22 DIAGNOSIS — J020 Streptococcal pharyngitis: Secondary | ICD-10-CM | POA: Diagnosis not present

## 2011-08-22 DIAGNOSIS — R030 Elevated blood-pressure reading, without diagnosis of hypertension: Secondary | ICD-10-CM | POA: Diagnosis not present

## 2011-08-23 ENCOUNTER — Ambulatory Visit (INDEPENDENT_AMBULATORY_CARE_PROVIDER_SITE_OTHER): Payer: Medicare Other | Admitting: Physician Assistant

## 2011-08-23 VITALS — Wt 154.0 lb

## 2011-08-23 DIAGNOSIS — F332 Major depressive disorder, recurrent severe without psychotic features: Secondary | ICD-10-CM | POA: Diagnosis not present

## 2011-08-23 DIAGNOSIS — F411 Generalized anxiety disorder: Secondary | ICD-10-CM

## 2011-08-23 NOTE — Progress Notes (Signed)
   [facility] Behavioral Health Follow-up Outpatient Visit  [name] 1960/08/30  Date: 08/23/2011   Subjective: [name] presents today to followup on her medications prescribed for her depression and anxiety. She complains that her life has been rather hectic recently. She explains that she currently has strep throat, her daughter has been ill recently, she is worried about financial problems, she injured her back and is having to have physical therapy, she has been diagnosed with a bunion on her foot and she is unable to walk. She also is worried about her son who is $8000 short in being able to pay for his education at [employer], and her ex-husband is in a manic phase. She started her diet and initially lost 10 pounds, but then when she hurt herself and was unable to exercise she gained 5 pounds back. She reports that she has been depressed, she is sleeping more than usual, she is not caring for her home as she would like to, she is eating more and eating unhealthy meals. She discovered that the boyfriend that she had met on line turned out to be a fraud, and she feels she is very close to losing some money to him. She continues to see her therapist every 2 weeks. She denies any suicidal or homicidal ideation. She denies any auditory or visual hallucinations.  There were no vitals filed for this visit.  Mental Status Examination  Appearance: Well groomed and dressed Alert: Yes Attention: good  Cooperative: Yes Eye Contact: Good Speech: Clear and even Psychomotor Activity: Normal Memory/Concentration: Intact Oriented: person, place, time/date and situation Mood: Anxious and Depressed Affect: Congruent Thought Processes and Associations: Circumstantial, Coherent and Logical Fund of Knowledge: Good Thought Content: Normal Insight: Good Judgement: Good  Diagnosis: Maj. depressive disorder, recurrent, moderate; generalized anxiety disorder  Treatment Plan: We will continue her  Abilify 10 mg daily, and Pristiq 100 mg daily.  Christyna Letendre, PA-C

## 2011-08-25 ENCOUNTER — Ambulatory Visit: Payer: Medicare Other | Admitting: Physical Therapy

## 2011-08-25 DIAGNOSIS — M2569 Stiffness of other specified joint, not elsewhere classified: Secondary | ICD-10-CM | POA: Diagnosis not present

## 2011-08-25 DIAGNOSIS — IMO0001 Reserved for inherently not codable concepts without codable children: Secondary | ICD-10-CM | POA: Diagnosis not present

## 2011-08-25 DIAGNOSIS — Q211 Atrial septal defect: Secondary | ICD-10-CM | POA: Diagnosis not present

## 2011-08-25 DIAGNOSIS — I059 Rheumatic mitral valve disease, unspecified: Secondary | ICD-10-CM | POA: Diagnosis not present

## 2011-08-25 DIAGNOSIS — M25539 Pain in unspecified wrist: Secondary | ICD-10-CM | POA: Diagnosis not present

## 2011-08-25 DIAGNOSIS — Q2111 Secundum atrial septal defect: Secondary | ICD-10-CM | POA: Diagnosis not present

## 2011-08-25 DIAGNOSIS — M545 Low back pain, unspecified: Secondary | ICD-10-CM | POA: Diagnosis not present

## 2011-08-25 DIAGNOSIS — Z954 Presence of other heart-valve replacement: Secondary | ICD-10-CM | POA: Diagnosis not present

## 2011-09-01 ENCOUNTER — Encounter: Payer: Medicare Other | Admitting: Physical Therapy

## 2011-09-01 DIAGNOSIS — Q2111 Secundum atrial septal defect: Secondary | ICD-10-CM | POA: Diagnosis not present

## 2011-09-01 DIAGNOSIS — I059 Rheumatic mitral valve disease, unspecified: Secondary | ICD-10-CM | POA: Diagnosis not present

## 2011-09-01 DIAGNOSIS — Q211 Atrial septal defect: Secondary | ICD-10-CM | POA: Diagnosis not present

## 2011-09-01 DIAGNOSIS — Z954 Presence of other heart-valve replacement: Secondary | ICD-10-CM | POA: Diagnosis not present

## 2011-09-01 DIAGNOSIS — E7800 Pure hypercholesterolemia, unspecified: Secondary | ICD-10-CM | POA: Diagnosis not present

## 2011-09-02 DIAGNOSIS — I059 Rheumatic mitral valve disease, unspecified: Secondary | ICD-10-CM | POA: Diagnosis not present

## 2011-09-02 DIAGNOSIS — Q211 Atrial septal defect: Secondary | ICD-10-CM | POA: Diagnosis not present

## 2011-09-02 DIAGNOSIS — Q2111 Secundum atrial septal defect: Secondary | ICD-10-CM | POA: Diagnosis not present

## 2011-09-02 DIAGNOSIS — E7800 Pure hypercholesterolemia, unspecified: Secondary | ICD-10-CM | POA: Diagnosis not present

## 2011-09-02 DIAGNOSIS — Z954 Presence of other heart-valve replacement: Secondary | ICD-10-CM | POA: Diagnosis not present

## 2011-09-03 ENCOUNTER — Ambulatory Visit: Payer: Medicare Other | Admitting: Physical Therapy

## 2011-09-03 DIAGNOSIS — M545 Low back pain, unspecified: Secondary | ICD-10-CM | POA: Diagnosis not present

## 2011-09-03 DIAGNOSIS — IMO0001 Reserved for inherently not codable concepts without codable children: Secondary | ICD-10-CM | POA: Diagnosis not present

## 2011-09-03 DIAGNOSIS — M2569 Stiffness of other specified joint, not elsewhere classified: Secondary | ICD-10-CM | POA: Diagnosis not present

## 2011-09-14 DIAGNOSIS — F319 Bipolar disorder, unspecified: Secondary | ICD-10-CM | POA: Diagnosis not present

## 2011-09-14 DIAGNOSIS — F339 Major depressive disorder, recurrent, unspecified: Secondary | ICD-10-CM | POA: Diagnosis not present

## 2011-09-15 DIAGNOSIS — J029 Acute pharyngitis, unspecified: Secondary | ICD-10-CM | POA: Diagnosis not present

## 2011-09-15 DIAGNOSIS — J329 Chronic sinusitis, unspecified: Secondary | ICD-10-CM | POA: Diagnosis not present

## 2011-09-20 DIAGNOSIS — J209 Acute bronchitis, unspecified: Secondary | ICD-10-CM | POA: Diagnosis not present

## 2011-09-21 DIAGNOSIS — F319 Bipolar disorder, unspecified: Secondary | ICD-10-CM | POA: Diagnosis not present

## 2011-09-21 DIAGNOSIS — F339 Major depressive disorder, recurrent, unspecified: Secondary | ICD-10-CM | POA: Diagnosis not present

## 2011-10-13 ENCOUNTER — Ambulatory Visit (INDEPENDENT_AMBULATORY_CARE_PROVIDER_SITE_OTHER): Payer: Medicare Other | Admitting: Obstetrics and Gynecology

## 2011-10-13 ENCOUNTER — Other Ambulatory Visit: Payer: Self-pay | Admitting: *Deleted

## 2011-10-13 ENCOUNTER — Telehealth: Payer: Self-pay | Admitting: *Deleted

## 2011-10-13 DIAGNOSIS — N63 Unspecified lump in unspecified breast: Secondary | ICD-10-CM

## 2011-10-13 NOTE — Telephone Encounter (Signed)
Message copied by Libby Maw on Wed Oct 13, 2011  3:55 PM ------      Message from: Trellis Paganini      Created: Wed Oct 13, 2011 11:24 AM       Patient has 3 very small firm areas in her left breast at 12:00. Please schedule diagnostic mammogram and ultrasound of left breast at [facility]. Patient's last mammogram there was November, 2012.

## 2011-10-13 NOTE — Progress Notes (Signed)
The patient came in today because she found 3 small firm masses in her left breast. She had a normal mammogram in November. Her mother had breast cancer on 3 occasions.  Exam: Kennon Portela present. Both breasts were carefully examined. No masses were felt in the right breast. In her left breast above the nipple approximately 2 cm from the areola there are 3 distinct nodules each of which is 1-2 mm.  Assessment: New breast masses probable fibroadenoma or fibrocystic disease  Plan: Diagnostic mammogram and ultrasound of left breast

## 2011-10-13 NOTE — Telephone Encounter (Signed)
Patient informed appt set up for 10/18/11 @ 3pm at [facility].

## 2011-10-18 DIAGNOSIS — N63 Unspecified lump in unspecified breast: Secondary | ICD-10-CM | POA: Diagnosis not present

## 2011-10-18 DIAGNOSIS — N6459 Other signs and symptoms in breast: Secondary | ICD-10-CM | POA: Diagnosis not present

## 2011-10-19 ENCOUNTER — Other Ambulatory Visit: Payer: Self-pay | Admitting: Obstetrics and Gynecology

## 2011-10-19 DIAGNOSIS — N63 Unspecified lump in unspecified breast: Secondary | ICD-10-CM

## 2011-10-25 ENCOUNTER — Ambulatory Visit (INDEPENDENT_AMBULATORY_CARE_PROVIDER_SITE_OTHER): Payer: Medicare Other | Admitting: Physician Assistant

## 2011-10-25 DIAGNOSIS — F411 Generalized anxiety disorder: Secondary | ICD-10-CM

## 2011-10-25 DIAGNOSIS — F331 Major depressive disorder, recurrent, moderate: Secondary | ICD-10-CM | POA: Diagnosis not present

## 2011-10-25 MED ORDER — BUPROPION HCL ER (SR) 150 MG PO TB12: 150.0000 mg | ORAL_TABLET | Freq: Every day | ORAL | Status: DC

## 2011-10-25 NOTE — Progress Notes (Signed)
   [facility] Behavioral Health Follow-up Outpatient Visit  DEMIRA GWYNNE Mar 09, 1961  Date: 10/25/2011   Subjective: Morgann presents today to followup on her medications prescribed for anxiety and depression. When asked how she was doing she stated "I'm trying to survive." She reports that she is "in our right." She states that she does not want to clean her home, or exercise, and that she is not eating properly. She states that she wants to watch TV all the time, and that her sleeping has increased and that she is napping during the day. She endorses feelings of sadness. She has not seen her counselor in 3 weeks, and is only attending 1 AA meeting per week. She reports that her energy is low and she has decreased interest.  This Clinical research associate screen her for ADHD and she reports that she is intact forgetful, misplaces items frequently, is extremely disorganized, has poor focus and concentration, and is easily distracted.  [name] denies any suicidal or homicidal ideation. She denies any auditory or visual hallucinations.  There were no vitals filed for this visit.  Mental Status Examination  Appearance: Well groomed and casually dressed Alert: Yes Attention: good  Cooperative: Yes Eye Contact: Good Speech: Clear and coherent Psychomotor Activity: Normal Memory/Concentration: Intact Oriented: person, place, time/date and situation Mood: Dysphoric Affect: Congruent Thought Processes and Associations: Logical Fund of Knowledge: Good Thought Content: Normal Insight: Good Judgement: Good  Diagnosis: Generalized anxiety disorder; major depressive disorder, recurrent, moderate; rule out ADHD inattentive type.  Treatment Plan: We will add Wellbutrin SR 150 mg to be taken in the morning to target depression and inattentiveness. We will continue her Abilify 10 mg daily, and Pristiq 100 mg daily. She will return for followup in 6 weeks.  Jami Bogdanski, PA-C

## 2011-10-27 DIAGNOSIS — F3131 Bipolar disorder, current episode depressed, mild: Secondary | ICD-10-CM | POA: Diagnosis not present

## 2011-11-03 ENCOUNTER — Encounter (HOSPITAL_COMMUNITY): Payer: Self-pay | Admitting: *Deleted

## 2011-11-03 ENCOUNTER — Emergency Department (HOSPITAL_COMMUNITY)
Admission: EM | Admit: 2011-11-03 | Discharge: 2011-11-03 | Disposition: A | Discharge status: Home / Self Care | Payer: Medicare Other | Attending: Emergency Medicine | Admitting: Emergency Medicine

## 2011-11-03 DIAGNOSIS — M129 Arthropathy, unspecified: Secondary | ICD-10-CM | POA: Diagnosis not present

## 2011-11-03 DIAGNOSIS — K2900 Acute gastritis without bleeding: Secondary | ICD-10-CM | POA: Diagnosis not present

## 2011-11-03 DIAGNOSIS — IMO0001 Reserved for inherently not codable concepts without codable children: Secondary | ICD-10-CM | POA: Diagnosis not present

## 2011-11-03 DIAGNOSIS — K2970 Gastritis, unspecified, without bleeding: Secondary | ICD-10-CM

## 2011-11-03 DIAGNOSIS — R1013 Epigastric pain: Secondary | ICD-10-CM | POA: Diagnosis not present

## 2011-11-03 DIAGNOSIS — Z86711 Personal history of pulmonary embolism: Secondary | ICD-10-CM | POA: Insufficient documentation

## 2011-11-03 LAB — COMPREHENSIVE METABOLIC PANEL
ALT: 27 U/L (ref 0–35)
AST: 33 U/L (ref 0–37)
Albumin: 4.6 g/dL (ref 3.5–5.2)
Alkaline Phosphatase: 105 U/L (ref 39–117)
Chloride: 97 mEq/L (ref 96–112)
Potassium: 4.5 mEq/L (ref 3.5–5.1)
Sodium: 139 mEq/L (ref 135–145)
Total Bilirubin: 0.4 mg/dL (ref 0.3–1.2)

## 2011-11-03 LAB — URINE MICROSCOPIC-ADD ON

## 2011-11-03 LAB — URINALYSIS, ROUTINE W REFLEX MICROSCOPIC
Glucose, UA: NEGATIVE mg/dL
Leukocytes, UA: NEGATIVE
Nitrite: NEGATIVE
Protein, ur: 30 mg/dL — AB

## 2011-11-03 LAB — CBC
HCT: 45.6 % (ref 36.0–46.0)
Platelets: 280 10*3/uL (ref 150–400)
RDW: 13.5 % (ref 11.5–15.5)
WBC: 3.7 10*3/uL — ABNORMAL LOW (ref 4.0–10.5)

## 2011-11-03 MED ORDER — HYDROCODONE-ACETAMINOPHEN 5-325 MG PO TABS: 1.0000 | ORAL_TABLET | ORAL | Status: AC | PRN

## 2011-11-03 MED ORDER — PANTOPRAZOLE SODIUM 20 MG PO TBEC: 20.0000 mg | DELAYED_RELEASE_TABLET | Freq: Every day | ORAL | Status: DC

## 2011-11-03 MED ORDER — SODIUM CHLORIDE 0.9 % IV BOLUS (SEPSIS)
1000.0000 mL | Freq: Once | INTRAVENOUS | Status: AC
  Administered 2011-11-03: 1000 mL via INTRAVENOUS

## 2011-11-03 MED ORDER — ONDANSETRON 4 MG PO TBDP: 4.0000 mg | ORAL_TABLET | Freq: Three times a day (TID) | ORAL | Status: AC | PRN

## 2011-11-03 MED ORDER — MORPHINE SULFATE 4 MG/ML IJ SOLN
4.0000 mg | Freq: Once | INTRAMUSCULAR | Status: AC
  Administered 2011-11-03: 4 mg via INTRAVENOUS
  Filled 2011-11-03: qty 1

## 2011-11-03 MED ORDER — ONDANSETRON HCL 4 MG/2ML IJ SOLN
4.0000 mg | Freq: Once | INTRAMUSCULAR | Status: AC
  Administered 2011-11-03: 4 mg via INTRAVENOUS
  Filled 2011-11-03 (×2): qty 2

## 2011-11-03 MED ORDER — PANTOPRAZOLE SODIUM 40 MG IV SOLR
40.0000 mg | Freq: Once | INTRAVENOUS | Status: AC
  Administered 2011-11-03: 40 mg via INTRAVENOUS
  Filled 2011-11-03: qty 40

## 2011-11-03 NOTE — ED Provider Notes (Signed)
Medical screening examination/treatment/procedure(s) were performed by non-physician practitioner and as supervising physician I was immediately available for consultation/collaboration.   Rakan Soffer, MD 11/03/11 2312 

## 2011-11-03 NOTE — Discharge Instructions (Signed)
Your labs are normal today, which is reassuring. Please take the Protonix daily as prescribed for at least the next week to 10 days. Use the pain and nausea medications as needed. If her pain worsens or changes locations in your abdomen, you run a high fever not controlled by medication, or if you have any other worrisome symptoms, please return to the emergency department immediately.  Gastritis Gastritis is an inflammation (the body's way of reacting to injury and/or infection) of the stomach. It is often caused by viral or bacterial (germ) infections. It can also be caused by chemicals (including alcohol) and medications. This illness may be associated with generalized malaise (feeling tired, not well), cramps, and fever. The illness may last 2 to 7 days. If symptoms of gastritis continue, gastroscopy (looking into the stomach with a telescope-like instrument), biopsy (taking tissue samples), and/or blood tests may be necessary to determine the cause. Antibiotics will not affect the illness unless there is a bacterial infection present. One common bacterial cause of gastritis is an organism known as H. Pylori. This can be treated with antibiotics. Other forms of gastritis are caused by too much acid in the stomach. They can be treated with medications such as H2 blockers and antacids. Home treatment is usually all that is needed. Young children will quickly become dehydrated (loss of body fluids) if vomiting and diarrhea are both present. Medications may be given to control nausea. Medications are usually not given for diarrhea unless especially bothersome. Some medications slow the removal of the virus from the gastrointestinal tract. This slows down the healing process. HOME CARE INSTRUCTIONS Home care instructions for nausea and vomiting:  For adults: drink small amounts of fluids often. Drink at least 2 quarts a day. Take sips frequently. Do not drink large amounts of fluid at one time. This may worsen  the nausea.   Only take over-the-counter or prescription medicines for pain, discomfort, or fever as directed by your caregiver.   Drink clear liquids only. Those are anything you can see through such as water, broth, or soft drinks.   Once you are keeping clear liquids down, you may start full liquids, soups, juices, and ice cream or sherbet. Slowly add bland (plain, not spicy) foods to your diet.  Home care instructions for diarrhea:  Diarrhea can be caused by bacterial infections or a virus. Your condition should improve with time, rest, fluids, and/or anti-diarrheal medication.   Until your diarrhea is under control, you should drink clear liquids often in small amounts. Clear liquids include: water, broth, jell-o water and weak tea.  Avoid:  Milk.   Fruits.   Tobacco.   Alcohol.   Extremely hot or cold fluids.   Too much intake of anything at one time.  When your diarrhea stops you may add the following foods, which help the stool to become more formed:  Rice.   Bananas.   Apples without skin.   Dry toast.  Once these foods are tolerated you may add low-fat yogurt and low-fat cottage cheese. They will help to restore the normal bacterial balance in your bowel. Wash your hands well to avoid spreading bacteria (germ) or virus. SEEK IMMEDIATE MEDICAL CARE IF:   You are unable to keep fluids down.   Vomiting or diarrhea become persistent (constant).   Abdominal pain develops, increases, or localizes. (Right sided pain can be appendicitis. Left sided pain in adults can be diverticulitis.)   You develop a fever (an oral temperature above 102 F (38.9  C)).   Diarrhea becomes excessive or contains blood or mucus.   You have excessive weakness, dizziness, fainting or extreme thirst.   You are not improving or you are getting worse.   You have any other questions or concerns.  Document Released: 04/20/2001 Document Revised: 04/15/2011 Document Reviewed:  04/26/2005 [facility] Patient Information 2012 [place], [place].

## 2011-11-03 NOTE — ED Provider Notes (Signed)
History     CSN: [id number]  Arrival date & time 11/03/11  1208   First MD Initiated Contact with Patient 11/03/11 1554      Chief Complaint  Patient presents with  . Abdominal Pain    (Consider location/radiation/quality/duration/timing/severity/associated sxs/prior treatment) HPI History from patient. 51 year old female who presents with abdominal pain. She states this started yesterday afternoon while she was eating some pizza. She was unable to finish her meal. Pain is described as sharp and stabbing and located to the epigastrium. It does not radiate. It has not changed in location since onset. It waxes and wanes in intensity. The symptoms are described as moderate. She has had associated vomiting this morning with nausea. She had one loose bowel movement today. She denies urinary symptoms, vaginal bleeding or discharge. She has not had fever or chills. She took Phenergan at home with mild relief of her symptoms. Surgical history is pertinent for appendectomy, cholecystectomy, tubal ligation, and cesarean section.  Past Medical History  Diagnosis Date  . Depression   . Migraine   . Fibromyalgia   . Hearing impairment   . Arthritis   . Cardiomyopathy   . Pulmonary embolism     Past Surgical History  Procedure Date  . Appendectomy   . Inner ear surgery     TUBES  . Open heart surgery     2  . Tonsillectomy and adenoidectomy   . Tubal ligation     Family History  Problem Relation Age of Onset  . Cancer Mother   . Breast cancer Mother   . Depression Sister   . Heart disease Father   . Hypertension Father     History  Substance Use Topics  . Smoking status: Never Smoker   . Smokeless tobacco: Not on file  . Alcohol Use: No    OB History    Grav Para Term Preterm Abortions TAB SAB Ect Mult Living   2 2 2       2       Review of Systems  Constitutional: Negative for fever and chills.  Gastrointestinal: Positive for nausea, vomiting and abdominal pain.  Negative for diarrhea, constipation, blood in stool, abdominal distention, anal bleeding and rectal pain.  Genitourinary: Negative for dysuria, flank pain, vaginal bleeding and vaginal discharge.  All other systems reviewed and are negative.    Allergies  Valium and Diazepam  Home Medications   Current Outpatient Rx  Name Route Sig Dispense Refill  . ARIPIPRAZOLE 10 MG PO TABS Oral Take 10 mg by mouth daily.    . ASPIRIN 81 MG PO TABS Oral Take 81 mg by mouth daily.      [name] CALCIUM CARBONATE-VITAMIN D 500-200 MG-UNIT PO TABS Oral Take 1 tablet by mouth 2 (two) times daily.    [name] CARVEDILOL 25 MG PO TABS Oral Take 25 mg by mouth 2 (two) times daily with a meal.    . DESVENLAFAXINE SUCCINATE ER 100 MG PO TB24 Oral Take 1 tablet (100 mg total) by mouth daily. 30 tablet 1  . OMEGA-3 FATTY ACIDS 1000 MG PO CAPS Oral Take 1 g by mouth daily.      [name] HYDROCHLOROTHIAZIDE 12.5 MG PO CAPS Oral Take 12.5 mg by mouth daily.    [name] ONE-DAILY MULTI VITAMINS PO TABS Oral Take 1 tablet by mouth daily.      [name] PROMETHAZINE HCL 25 MG PO TABS Oral Take 25 mg by mouth every 8 (eight) hours as needed. For nausea  BP 125/66  Pulse 88  Temp 98.7 F (37.1 C) (Oral)  Resp 18  SpO2 98%  Physical Exam  Nursing note and vitals reviewed. Constitutional: She appears well-developed and well-nourished. No distress.  HENT:  Head: Normocephalic and atraumatic.  Mouth/Throat: Oropharynx is clear and moist. No oropharyngeal exudate.  Eyes:       Normal appearance  Neck: Normal range of motion.  Cardiovascular: Normal rate, regular rhythm and normal heart sounds.   Pulmonary/Chest: Effort normal and breath sounds normal. She exhibits no tenderness.  Abdominal: Soft. Bowel sounds are normal.       Tender to palpation to the epigastrium without rebound or guarding. Normal bowel sounds.  Musculoskeletal: Normal range of motion.  Neurological: She is alert.  Skin: Skin is warm and dry. She is not diaphoretic.    Psychiatric: She has a normal mood and affect.    ED Course  Procedures (including critical care time)  Labs Reviewed  URINALYSIS, ROUTINE W REFLEX MICROSCOPIC - Abnormal; Notable for the following:    Hgb urine dipstick MODERATE (*)     Protein, ur 30 (*)     All other components within normal limits  CBC - Abnormal; Notable for the following:    WBC 3.7 (*)     Hemoglobin 15.8 (*)     All other components within normal limits  COMPREHENSIVE METABOLIC PANEL - Abnormal; Notable for the following:    Total Protein 8.4 (*)     GFR calc non Af Amer 80 (*)     All other components within normal limits  LIPASE, BLOOD  URINE MICROSCOPIC-ADD ON   No results found.   1. Gastritis       MDM  Patient presents with pain to the epigastrium accompanied by nausea and vomiting since yesterday afternoon. Pain has not changed location since onset. On exam, she is tender to the epigastrium. She was treated with morphine, Zofran, and proton ex and had relief of her pain with this. On repeat exam, her tenderness is significantly reduced. Patient's labs are generally unremarkable which is reassuring. She will be discharged home with symptomatic treatment. She was instructed to return immediately should her pain worsen, change location, or should she have new symptoms. She verbalized understanding and agreed to this plan.        Grant Fontana, PA-C 11/03/11 1941

## 2011-11-03 NOTE — ED Notes (Signed)
Pt reports upper gi abd pain that started yesterday, having vomiting this am and still nauseated, denies diarrhea.

## 2011-11-04 DIAGNOSIS — F319 Bipolar disorder, unspecified: Secondary | ICD-10-CM | POA: Diagnosis not present

## 2011-11-14 DIAGNOSIS — R112 Nausea with vomiting, unspecified: Secondary | ICD-10-CM | POA: Diagnosis not present

## 2011-11-14 DIAGNOSIS — R1013 Epigastric pain: Secondary | ICD-10-CM | POA: Diagnosis not present

## 2011-11-17 DIAGNOSIS — F410 Panic disorder [episodic paroxysmal anxiety] without agoraphobia: Secondary | ICD-10-CM | POA: Diagnosis not present

## 2011-11-17 DIAGNOSIS — F319 Bipolar disorder, unspecified: Secondary | ICD-10-CM | POA: Diagnosis not present

## 2011-11-21 ENCOUNTER — Emergency Department (HOSPITAL_COMMUNITY)
Admission: EM | Admit: 2011-11-21 | Discharge: 2011-11-21 | Disposition: A | Discharge status: Home / Self Care | Payer: Medicare Other | Attending: Emergency Medicine | Admitting: Emergency Medicine

## 2011-11-21 ENCOUNTER — Encounter (HOSPITAL_COMMUNITY): Payer: Self-pay | Admitting: *Deleted

## 2011-11-21 DIAGNOSIS — R002 Palpitations: Secondary | ICD-10-CM | POA: Insufficient documentation

## 2011-11-21 DIAGNOSIS — Z86718 Personal history of other venous thrombosis and embolism: Secondary | ICD-10-CM | POA: Diagnosis not present

## 2011-11-21 DIAGNOSIS — Z79899 Other long term (current) drug therapy: Secondary | ICD-10-CM | POA: Diagnosis not present

## 2011-11-21 DIAGNOSIS — F3289 Other specified depressive episodes: Secondary | ICD-10-CM | POA: Diagnosis not present

## 2011-11-21 DIAGNOSIS — R1013 Epigastric pain: Secondary | ICD-10-CM

## 2011-11-21 DIAGNOSIS — Z7982 Long term (current) use of aspirin: Secondary | ICD-10-CM | POA: Insufficient documentation

## 2011-11-21 DIAGNOSIS — R1110 Vomiting, unspecified: Secondary | ICD-10-CM

## 2011-11-21 DIAGNOSIS — R112 Nausea with vomiting, unspecified: Secondary | ICD-10-CM | POA: Diagnosis not present

## 2011-11-21 DIAGNOSIS — F329 Major depressive disorder, single episode, unspecified: Secondary | ICD-10-CM | POA: Diagnosis not present

## 2011-11-21 DIAGNOSIS — R197 Diarrhea, unspecified: Secondary | ICD-10-CM | POA: Diagnosis not present

## 2011-11-21 DIAGNOSIS — R110 Nausea: Secondary | ICD-10-CM | POA: Diagnosis not present

## 2011-11-21 DIAGNOSIS — M129 Arthropathy, unspecified: Secondary | ICD-10-CM | POA: Diagnosis not present

## 2011-11-21 HISTORY — DX: Gastritis, unspecified, without bleeding: K29.70

## 2011-11-21 LAB — CBC WITH DIFFERENTIAL/PLATELET
Basophils Absolute: 0.1 10*3/uL (ref 0.0–0.1)
HCT: 39.2 % (ref 36.0–46.0)
Hemoglobin: 13.7 g/dL (ref 12.0–15.0)
Lymphocytes Relative: 19 % (ref 12–46)
Monocytes Absolute: 0.4 10*3/uL (ref 0.1–1.0)
Monocytes Relative: 8 % (ref 3–12)
Neutro Abs: 3.8 10*3/uL (ref 1.7–7.7)
Neutrophils Relative %: 72 % (ref 43–77)
WBC: 5.4 10*3/uL (ref 4.0–10.5)

## 2011-11-21 LAB — COMPREHENSIVE METABOLIC PANEL
AST: 34 U/L (ref 0–37)
Albumin: 3.9 g/dL (ref 3.5–5.2)
Alkaline Phosphatase: 76 U/L (ref 39–117)
BUN: 17 mg/dL (ref 6–23)
CO2: 22 mEq/L (ref 19–32)
Chloride: 96 mEq/L (ref 96–112)
Creatinine, Ser: 0.84 mg/dL (ref 0.50–1.10)
GFR calc non Af Amer: 79 mL/min — ABNORMAL LOW (ref >90)
Potassium: 3.3 mEq/L — ABNORMAL LOW (ref 3.5–5.1)
Total Bilirubin: 0.7 mg/dL (ref 0.3–1.2)

## 2011-11-21 LAB — POCT I-STAT TROPONIN I: Troponin i, poc: 0 ng/mL (ref 0.00–0.08)

## 2011-11-21 MED ORDER — RANITIDINE HCL 150 MG PO TABS: 150.0000 mg | ORAL_TABLET | Freq: Two times a day (BID) | ORAL | Status: DC

## 2011-11-21 MED ORDER — PROMETHAZINE HCL 25 MG/ML IJ SOLN
25.0000 mg | Freq: Once | INTRAMUSCULAR | Status: AC
  Administered 2011-11-21: 25 mg via INTRAVENOUS
  Filled 2011-11-21: qty 1

## 2011-11-21 MED ORDER — ONDANSETRON HCL 4 MG/2ML IJ SOLN
4.0000 mg | Freq: Once | INTRAMUSCULAR | Status: AC
  Administered 2011-11-21: 4 mg via INTRAVENOUS

## 2011-11-21 MED ORDER — FENTANYL CITRATE 0.05 MG/ML IJ SOLN
50.0000 ug | Freq: Once | INTRAMUSCULAR | Status: AC
  Administered 2011-11-21: 50 ug via INTRAVENOUS
  Filled 2011-11-21: qty 2

## 2011-11-21 MED ORDER — LORAZEPAM 2 MG/ML IJ SOLN
INTRAMUSCULAR | Status: AC
  Filled 2011-11-21: qty 1

## 2011-11-21 MED ORDER — HYDROMORPHONE HCL PF 1 MG/ML IJ SOLN
0.5000 mg | Freq: Once | INTRAMUSCULAR | Status: AC
  Administered 2011-11-21: 0.5 mg via INTRAVENOUS
  Filled 2011-11-21: qty 1

## 2011-11-21 MED ORDER — ONDANSETRON HCL 4 MG/2ML IJ SOLN
4.0000 mg | Freq: Once | INTRAMUSCULAR | Status: AC
  Administered 2011-11-21: 4 mg via INTRAVENOUS
  Filled 2011-11-21: qty 2

## 2011-11-21 MED ORDER — FAMOTIDINE IN NACL 20-0.9 MG/50ML-% IV SOLN
20.0000 mg | Freq: Once | INTRAVENOUS | Status: AC
  Administered 2011-11-21: 20 mg via INTRAVENOUS
  Filled 2011-11-21: qty 50

## 2011-11-21 MED ORDER — LORAZEPAM 2 MG/ML IJ SOLN
1.0000 mg | Freq: Once | INTRAMUSCULAR | Status: AC
  Administered 2011-11-21: 1 mg via INTRAVENOUS

## 2011-11-21 MED ORDER — OMEPRAZOLE 20 MG PO CPDR: 20.0000 mg | DELAYED_RELEASE_CAPSULE | Freq: Two times a day (BID) | ORAL | Status: DC

## 2011-11-21 MED ORDER — ONDANSETRON HCL 4 MG/2ML IJ SOLN
INTRAMUSCULAR | Status: AC
  Filled 2011-11-21: qty 2

## 2011-11-21 MED ORDER — GI COCKTAIL ~~LOC~~
30.0000 mL | Freq: Once | ORAL | Status: AC
  Administered 2011-11-21: 30 mL via ORAL
  Filled 2011-11-21: qty 30

## 2011-11-21 MED ORDER — MORPHINE SULFATE 4 MG/ML IJ SOLN
4.0000 mg | Freq: Once | INTRAMUSCULAR | Status: AC
  Administered 2011-11-21: 4 mg via INTRAVENOUS
  Filled 2011-11-21: qty 1

## 2011-11-21 NOTE — ED Notes (Signed)
Patient is drowsy although she arouses easily. She is oriented to self and place only her mother is at the bedside. The mother states that patient continues to "talk out of her head." Patient is very calm at this time vital signs stable, NAD at present.

## 2011-11-21 NOTE — ED Notes (Signed)
Dr. Dierdre Highman MD at bedside.

## 2011-11-21 NOTE — ED Provider Notes (Signed)
12 noon patient alert Glasgow Coma Score 15 asymptomatic. Feels ready for discharge  Doug Sou, MD 11/21/11 1205

## 2011-11-21 NOTE — ED Notes (Signed)
Patient awake alert and oriented times 4 follows commands, she is feeling much better.

## 2011-11-21 NOTE — ED Notes (Signed)
Patient advises that she is starting to feel better.

## 2011-11-21 NOTE — ED Provider Notes (Signed)
History     CSN: [id number]  Arrival date & time 11/21/11  0208   First MD Initiated Contact with Patient 11/21/11 0224      Chief Complaint  Patient presents with  . Palpitations  . Emesis    (Consider location/radiation/quality/duration/timing/severity/associated sxs/prior treatment) HPI Comments: Patient presents with epigastric pain and nausea for approximately 12 hours. Patient started having several episodes of vomiting approximately 3 hours ago. Vomiting is nonbloody, nonbilious. Patient had similar symptoms to this and was seen in emergency department last month, improved with PPI and pain medicine. Patient saw her doctor which prescribed her medicine for nausea. Her symptoms spontaneously improved. She had no further evaluation. She returns with similar symptoms. In addition the patient states that her heart felt was racing. She took 2 Coreg for this because she hadn't taken her daily medications earlier in the day due to nausea. Currently her heart racing is resolved. Patient has a history of valve repair surgery as well as atrial septal defect repair. Nothing makes symptoms better or worse. Onset was acute. Course is constant.  Patient is a 51 y.o. female presenting with vomiting. The history is provided by the patient.  Emesis  This is a new problem. The current episode started 6 to 12 hours ago. The problem has not changed since onset.The emesis has an appearance of stomach contents. There has been no fever. Associated symptoms include abdominal pain and diarrhea. Pertinent negatives include no chills, no cough, no fever, no headaches, no myalgias and no URI.    Past Medical History  Diagnosis Date  . Depression   . Migraine   . Fibromyalgia   . Hearing impairment   . Arthritis   . Cardiomyopathy   . Pulmonary embolism   . Gastritis     Past Surgical History  Procedure Date  . Appendectomy   . Inner ear surgery     TUBES  . Open heart surgery     2  .  Tonsillectomy and adenoidectomy   . Tubal ligation     Family History  Problem Relation Age of Onset  . Cancer Mother   . Breast cancer Mother   . Depression Sister   . Heart disease Father   . Hypertension Father     History  Substance Use Topics  . Smoking status: Never Smoker   . Smokeless tobacco: Not on file  . Alcohol Use: No    OB History    Grav Para Term Preterm Abortions TAB SAB Ect Mult Living   2 2 2       2       Review of Systems  Constitutional: Negative for fever and chills.  HENT: Negative for sore throat and rhinorrhea.   Eyes: Negative for redness.  Respiratory: Negative for cough.   Cardiovascular: Positive for palpitations. Negative for chest pain.  Gastrointestinal: Positive for nausea, vomiting, abdominal pain and diarrhea. Negative for blood in stool.  Genitourinary: Negative for dysuria.  Musculoskeletal: Negative for myalgias.  Skin: Negative for rash.  Neurological: Negative for headaches.    Allergies  Valium and Diazepam  Home Medications   Current Outpatient Rx  Name Route Sig Dispense Refill  . ARIPIPRAZOLE 10 MG PO TABS Oral Take 10 mg by mouth daily.    . ASPIRIN 81 MG PO TABS Oral Take 81 mg by mouth daily.      [name] CALCIUM CARBONATE-VITAMIN D 500-200 MG-UNIT PO TABS Oral Take 1 tablet by mouth 2 (two) times daily.    [name]  CARVEDILOL 25 MG PO TABS Oral Take 25 mg by mouth 2 (two) times daily with a meal.    . DESVENLAFAXINE SUCCINATE ER 100 MG PO TB24 Oral Take 1 tablet (100 mg total) by mouth daily. 30 tablet 1  . OMEGA-3 FATTY ACIDS 1000 MG PO CAPS Oral Take 1 g by mouth daily.      [name] HYDROCHLOROTHIAZIDE 12.5 MG PO CAPS Oral Take 12.5 mg by mouth daily.    [name] ONE-DAILY MULTI VITAMINS PO TABS Oral Take 1 tablet by mouth daily.      [name] PANTOPRAZOLE SODIUM 20 MG PO TBEC Oral Take 1 tablet (20 mg total) by mouth daily. 30 tablet 0  . PROMETHAZINE HCL 25 MG PO TABS Oral Take 25 mg by mouth every 8 (eight) hours as needed. For nausea       BP 128/93  Temp 98.3 F (36.8 C) (Oral)  Resp 24  SpO2 97%  Physical Exam  Nursing note and vitals reviewed. Constitutional: She appears well-developed and well-nourished.  HENT:  Head: Normocephalic and atraumatic.  Eyes: Conjunctivae are normal. Right eye exhibits no discharge. Left eye exhibits no discharge.  Neck: Normal range of motion. Neck supple.  Cardiovascular: Normal rate, regular rhythm and normal heart sounds.   No murmur heard.      Normal rate and rhythm  Pulmonary/Chest: Effort normal and breath sounds normal.  Abdominal: Soft. Bowel sounds are normal. There is tenderness in the epigastric area. There is no rigidity, no rebound, no guarding and negative Murphy's sign.    Neurological: She is alert.  Skin: Skin is warm and dry.  Psychiatric: She has a normal mood and affect.    ED Course  Procedures (including critical care time)  Labs Reviewed  COMPREHENSIVE METABOLIC PANEL - Abnormal; Notable for the following:    Potassium 3.3 (*)     Glucose, Bld 103 (*)     GFR calc non Af Amer 79 (*)     All other components within normal limits  CBC WITH DIFFERENTIAL  LIPASE, BLOOD  POCT I-STAT TROPONIN I  LAB REPORT - SCANNED   No results found.   1. Epigastric pain   2. Vomiting     2:42 AM Patient seen and examined. Work-up initiated. Medications ordered.   Vital signs reviewed and are as follows: Filed Vitals:   11/21/11 0210  BP: 128/93  Temp: 98.3 F (36.8 C)  Resp: 24    Date: 11/21/2011  Rate: 89  Rhythm: normal sinus rhythm  QRS Axis: normal  Intervals: normal  ST/T Wave abnormalities: normal  Conduction Disutrbances:none  Narrative Interpretation: QTc 484  Old EKG Reviewed: unchanged from 10/30/10   Report from nurse that patient 'passed out' after drinking GI cocktail. Nurse was not present in room, however, nurse reported immediately and patient appeared normal with normal vitals, no tachy/brady, no diaphoresis or pallor, no  CP.     Patient improved in ED, nausea resolved, pain is minimal.   Patient discussed with and seen by Dr. Dierdre Highman.  Patient placed on H2 blocker and PPI. Urged PCP follow-up. GI follow-up referral given.   The patient was urged to return to the Emergency Department immediately with worsening of current symptoms, worsening abdominal pain, persistent vomiting, blood noted in stools, fever, or any other concerns. The patient verbalized understanding.     MDM  Epigastric pain: improved with treatment, labs reassuring. Suspicion of cholelithiasis low, PUD is greater. GI referral/follow-up indicated. Symptomatic management indicated for now return if  change or worsening.   Palpitations: EKG normal, no symptoms of syncope/near-syncope. No CP, SOB. Do not suspect that this represents any serious cardiac etiology. Trop neg.         Renne Crigler, [place] 11/22/11 1733

## 2011-11-21 NOTE — ED Notes (Signed)
Pt with vomiting all day, states it was yellow liquid.  Tonight, felt her "heart pounding and going fast" around 11 pm.  Took 2 coreg one hour ago.    C/o abdominal pain, n/v.

## 2011-11-21 NOTE — ED Notes (Signed)
Pt mother called out saying the patient "had stiffened up and her eyes rolled back in her head", pt reports she think she passed out. The pt is appropriate on assessment, vitals stable. PA made aware of the same

## 2011-11-21 NOTE — ED Notes (Signed)
Pt continues to be restless and having some confusion. Spoke with Dr. Dierdre Highman, okay to hold off on d/c at this time as we continue to reasss pt. Report given to oncoming nurse

## 2011-11-21 NOTE — ED Notes (Signed)
Patient discharged with instructions with her mother. Discussed instructions and prescriptions, and follow up.

## 2011-11-21 NOTE — ED Notes (Signed)
In room to reassess pt post medication administration, pt is confused, restless. Pt mother states that since med admin the patient has become increasingly confused. Patient able to state her name and place, unable to discuss circumstances or have appropriate conversation.  Spoke with Dr. Dierdre Highman and obtained order for Ativan. Pt remains monitored at this time

## 2011-11-21 NOTE — ED Notes (Signed)
PA at bedside.

## 2011-11-21 NOTE — ED Notes (Signed)
Patient now eating applesauce and crackers. Seems to be tolerating well.

## 2011-11-24 NOTE — ED Provider Notes (Signed)
Medical screening examination/treatment/procedure(s) were performed by non-physician practitioner and as supervising physician I was immediately available for consultation/collaboration.  Brailey Buescher, MD 11/24/11 2250 

## 2011-11-25 DIAGNOSIS — F331 Major depressive disorder, recurrent, moderate: Secondary | ICD-10-CM | POA: Diagnosis not present

## 2011-11-25 DIAGNOSIS — F319 Bipolar disorder, unspecified: Secondary | ICD-10-CM | POA: Diagnosis not present

## 2011-12-01 DIAGNOSIS — R112 Nausea with vomiting, unspecified: Secondary | ICD-10-CM | POA: Diagnosis not present

## 2011-12-01 DIAGNOSIS — R000 Tachycardia, unspecified: Secondary | ICD-10-CM | POA: Diagnosis not present

## 2011-12-01 DIAGNOSIS — R82998 Other abnormal findings in urine: Secondary | ICD-10-CM | POA: Diagnosis not present

## 2011-12-01 DIAGNOSIS — E860 Dehydration: Secondary | ICD-10-CM | POA: Diagnosis not present

## 2011-12-02 DIAGNOSIS — F3131 Bipolar disorder, current episode depressed, mild: Secondary | ICD-10-CM | POA: Diagnosis not present

## 2011-12-02 DIAGNOSIS — F411 Generalized anxiety disorder: Secondary | ICD-10-CM | POA: Diagnosis not present

## 2011-12-07 ENCOUNTER — Ambulatory Visit (INDEPENDENT_AMBULATORY_CARE_PROVIDER_SITE_OTHER): Payer: Medicare Other | Admitting: Physician Assistant

## 2011-12-07 DIAGNOSIS — F411 Generalized anxiety disorder: Secondary | ICD-10-CM | POA: Diagnosis not present

## 2011-12-07 DIAGNOSIS — F332 Major depressive disorder, recurrent severe without psychotic features: Secondary | ICD-10-CM

## 2011-12-07 MED ORDER — BUPROPION HCL ER (XL) 300 MG PO TB24: 300.0000 mg | ORAL_TABLET | Freq: Every day | ORAL | Status: DC

## 2011-12-07 NOTE — Progress Notes (Signed)
   [facility] Behavioral Health Follow-up Outpatient Visit  AME HEAGLE November 13, 1960  Date: 12/07/11   Subjective: [name] presents today to followup on her treatment for depression and anxiety. She reports that she is still very depressed, and has seen no change in her mood since starting the Wellbutrin. She endorses staying in bed and watching TV most of the day. She is not caring for herself properly, and she is neglecting her children somewhat. She is isolating, and is not eating well. She wants to sleep all the time. She feels overwhelmed with being a mother 24 hours a day while her daughter is out of school. She has not been attending regular AA meetings or meeting with her sponsor. When reviewing her medications, it was not clear whether she was being compliant with her Pristiq and or Abilify. he denies any suicidal or homicidal ideation she denies any auditory or visual hallucinations.  There were no vitals filed for this visit.  Mental Status Examination  Appearance: Casual Alert: Yes Attention: good  Cooperative: Yes Eye Contact: Good Speech: Clear and coherent Psychomotor Activity: Normal Memory/Concentration: Memory impaired/concentration intact Oriented: person, place, time/date and situation Mood: Depressed, Hopeless and Worthless Affect: Constricted Thought Processes and Associations: Linear and Logical Fund of Knowledge: Good Thought Content: Normal Insight: Fair Judgement: Fair  Diagnosis: Maj. depressive disorder, recurrent, severe; generalized anxiety disorder, rule out ADHD inattentive type  Treatment Plan: We will increase her Wellbutrin XL to 300 mg daily, and she will return in 6-8 weeks as my schedule will allow. We will continue her persist take at 100 mg daily, and Abilify 10 mg daily. She has been given some information on the wellness Academy as well as some support groups to consider attending when her daughter returns to school.  Grace Guthridge, PA-C

## 2011-12-09 DIAGNOSIS — F319 Bipolar disorder, unspecified: Secondary | ICD-10-CM | POA: Diagnosis not present

## 2011-12-09 DIAGNOSIS — F411 Generalized anxiety disorder: Secondary | ICD-10-CM | POA: Diagnosis not present

## 2011-12-24 DIAGNOSIS — M546 Pain in thoracic spine: Secondary | ICD-10-CM | POA: Diagnosis not present

## 2012-01-18 DIAGNOSIS — F319 Bipolar disorder, unspecified: Secondary | ICD-10-CM | POA: Diagnosis not present

## 2012-01-18 DIAGNOSIS — F411 Generalized anxiety disorder: Secondary | ICD-10-CM | POA: Diagnosis not present

## 2012-02-01 ENCOUNTER — Ambulatory Visit (HOSPITAL_COMMUNITY): Payer: Self-pay | Admitting: Physician Assistant

## 2012-02-03 ENCOUNTER — Ambulatory Visit (INDEPENDENT_AMBULATORY_CARE_PROVIDER_SITE_OTHER): Payer: Medicare Other | Admitting: Physician Assistant

## 2012-02-03 DIAGNOSIS — F411 Generalized anxiety disorder: Secondary | ICD-10-CM | POA: Diagnosis not present

## 2012-02-03 DIAGNOSIS — F3289 Other specified depressive episodes: Secondary | ICD-10-CM

## 2012-02-03 DIAGNOSIS — F332 Major depressive disorder, recurrent severe without psychotic features: Secondary | ICD-10-CM

## 2012-02-03 DIAGNOSIS — F331 Major depressive disorder, recurrent, moderate: Secondary | ICD-10-CM | POA: Diagnosis not present

## 2012-02-03 DIAGNOSIS — F329 Major depressive disorder, single episode, unspecified: Secondary | ICD-10-CM

## 2012-02-03 MED ORDER — ARIPIPRAZOLE 10 MG PO TABS: 10.0000 mg | ORAL_TABLET | Freq: Every day | ORAL | Status: DC

## 2012-02-03 MED ORDER — BUPROPION HCL ER (XL) 450 MG PO TB24: 450.0000 mg | ORAL_TABLET | Freq: Every day | ORAL | Status: DC

## 2012-02-03 MED ORDER — DESVENLAFAXINE SUCCINATE ER 100 MG PO TB24: 100.0000 mg | ORAL_TABLET | Freq: Every day | ORAL | Status: DC

## 2012-02-03 NOTE — Progress Notes (Signed)
   [facility] Behavioral Health Follow-up Outpatient Visit  CHALSEY LEETH 04-07-1961  Date: 02/03/2012   Subjective: Myriam Jacobson presents today to followup on her treatment for her depression. She feels like she is better stabilized. She has been experiencing multiple physical illnesses recently. 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 Her son is going to [employer] and staying on campus, and her daughter is back in school. She feels very unmotivated, and endorses isolation. She reports that she has not been attending AA meetings do to her illnesses. She also reports that her sleep pattern is off, and she finds herself going back to bed after getting her daughter off to school in the morning. She denies any suicidal or homicidal ideation. She denies any auditory or visual hallucinations.  There were no vitals filed for this visit.  Mental Status Examination  Appearance: Well groomed and casually dressed Alert: Yes Attention: good  Cooperative: Yes Eye Contact: Good Speech: Clear and coherent Psychomotor Activity: Normal Memory/Concentration: Intact Oriented: person, place, time/date and situation Mood: Dysphoric Affect: Appropriate Thought Processes and Associations: Linear Fund of Knowledge: Good Thought Content: Normal Insight: Fair Judgement: Good  Diagnosis: Maj. depressive disorder, recurrent, moderate; generalized anxiety disorder.  Treatment Plan: We will increase her Wellbutrin XL to 450 mg, and continue her Abilify and prostatic. She will return for followup in 2 months.  Genesys Coggeshall, PA-C

## 2012-02-10 DIAGNOSIS — F319 Bipolar disorder, unspecified: Secondary | ICD-10-CM | POA: Diagnosis not present

## 2012-02-10 DIAGNOSIS — F810 Specific reading disorder: Secondary | ICD-10-CM | POA: Diagnosis not present

## 2012-02-20 DIAGNOSIS — M545 Low back pain, unspecified: Secondary | ICD-10-CM | POA: Diagnosis not present

## 2012-02-20 DIAGNOSIS — M797 Fibromyalgia: Secondary | ICD-10-CM | POA: Diagnosis not present

## 2012-02-20 DIAGNOSIS — M790 Rheumatism, unspecified: Secondary | ICD-10-CM | POA: Diagnosis not present

## 2012-03-03 DIAGNOSIS — Z23 Encounter for immunization: Secondary | ICD-10-CM | POA: Diagnosis not present

## 2012-03-03 DIAGNOSIS — M549 Dorsalgia, unspecified: Secondary | ICD-10-CM | POA: Diagnosis not present

## 2012-03-20 ENCOUNTER — Ambulatory Visit (INDEPENDENT_AMBULATORY_CARE_PROVIDER_SITE_OTHER): Payer: Medicare Other | Admitting: Physician Assistant

## 2012-03-20 DIAGNOSIS — F411 Generalized anxiety disorder: Secondary | ICD-10-CM

## 2012-03-20 DIAGNOSIS — F331 Major depressive disorder, recurrent, moderate: Secondary | ICD-10-CM

## 2012-03-20 DIAGNOSIS — F332 Major depressive disorder, recurrent severe without psychotic features: Secondary | ICD-10-CM

## 2012-03-20 NOTE — Progress Notes (Signed)
   [facility] Behavioral Health Follow-up Outpatient Visit  AMMA CREAR 1960/09/05  Date: 03/20/2012   Subjective: [name] presents today to followup on her treatment for depression and anxiety. She feels that her depression is much better, and that her mood is stable. She endorses an improvement in her motivation, but she continues to isolate. She's not going back to bed every day after her getting her daughter off to school. She stays up some days, and Sunday she goes back to bed. She has been having to deal with her daughter getting strep throat, then she got sick. Now she has been bugs in her apartment. She will begin attending the [facility] at [facility] tomorrow. She denies any suicidal or homicidal ideation. She denies any auditory or visual hallucinations.  There were no vitals filed for this visit.  Mental Status Examination  Appearance: Casual Alert: Yes Attention: good  Cooperative: Yes Eye Contact: Good Speech: Clear and coherent Psychomotor Activity: Normal Memory/Concentration: Intact Oriented: person, place, time/date and situation Mood: Dysphoric Affect: Congruent Thought Processes and Associations: Linear Fund of Knowledge: Good Thought Content: Normal Insight: Good Judgement: Good  Diagnosis: Generalized anxiety disorder;  major depressive disorder, recurrent, moderate  Treatment Plan: We will continue her Wellbutrin XL 450 mg daily, Abilify 10 mg daily, and prostatic 100 mg daily. She will return for followup in 2 months.  Tywon Niday, PA-C

## 2012-03-28 DIAGNOSIS — F810 Specific reading disorder: Secondary | ICD-10-CM | POA: Diagnosis not present

## 2012-03-28 DIAGNOSIS — F319 Bipolar disorder, unspecified: Secondary | ICD-10-CM | POA: Diagnosis not present

## 2012-04-13 DIAGNOSIS — F810 Specific reading disorder: Secondary | ICD-10-CM | POA: Diagnosis not present

## 2012-04-13 DIAGNOSIS — F319 Bipolar disorder, unspecified: Secondary | ICD-10-CM | POA: Diagnosis not present

## 2012-04-13 DIAGNOSIS — F411 Generalized anxiety disorder: Secondary | ICD-10-CM | POA: Diagnosis not present

## 2012-05-15 ENCOUNTER — Emergency Department (HOSPITAL_BASED_OUTPATIENT_CLINIC_OR_DEPARTMENT_OTHER)
Admission: EM | Admit: 2012-05-15 | Discharge: 2012-05-15 | Disposition: A | Discharge status: Home / Self Care | Payer: Medicare Other | Attending: Emergency Medicine | Admitting: Emergency Medicine

## 2012-05-15 ENCOUNTER — Encounter (HOSPITAL_BASED_OUTPATIENT_CLINIC_OR_DEPARTMENT_OTHER): Payer: Self-pay | Admitting: *Deleted

## 2012-05-15 DIAGNOSIS — F329 Major depressive disorder, single episode, unspecified: Secondary | ICD-10-CM | POA: Diagnosis not present

## 2012-05-15 DIAGNOSIS — F3289 Other specified depressive episodes: Secondary | ICD-10-CM | POA: Insufficient documentation

## 2012-05-15 DIAGNOSIS — R1013 Epigastric pain: Secondary | ICD-10-CM | POA: Diagnosis not present

## 2012-05-15 DIAGNOSIS — Z86711 Personal history of pulmonary embolism: Secondary | ICD-10-CM | POA: Diagnosis not present

## 2012-05-15 DIAGNOSIS — R197 Diarrhea, unspecified: Secondary | ICD-10-CM | POA: Diagnosis not present

## 2012-05-15 DIAGNOSIS — R6883 Chills (without fever): Secondary | ICD-10-CM | POA: Insufficient documentation

## 2012-05-15 DIAGNOSIS — R1110 Vomiting, unspecified: Secondary | ICD-10-CM | POA: Insufficient documentation

## 2012-05-15 DIAGNOSIS — Z7982 Long term (current) use of aspirin: Secondary | ICD-10-CM | POA: Insufficient documentation

## 2012-05-15 DIAGNOSIS — Z9851 Tubal ligation status: Secondary | ICD-10-CM | POA: Insufficient documentation

## 2012-05-15 DIAGNOSIS — Z8719 Personal history of other diseases of the digestive system: Secondary | ICD-10-CM | POA: Insufficient documentation

## 2012-05-15 DIAGNOSIS — IMO0001 Reserved for inherently not codable concepts without codable children: Secondary | ICD-10-CM | POA: Diagnosis not present

## 2012-05-15 DIAGNOSIS — Z8739 Personal history of other diseases of the musculoskeletal system and connective tissue: Secondary | ICD-10-CM | POA: Diagnosis not present

## 2012-05-15 DIAGNOSIS — H9190 Unspecified hearing loss, unspecified ear: Secondary | ICD-10-CM | POA: Insufficient documentation

## 2012-05-15 DIAGNOSIS — Z8679 Personal history of other diseases of the circulatory system: Secondary | ICD-10-CM | POA: Diagnosis not present

## 2012-05-15 LAB — CBC WITH DIFFERENTIAL/PLATELET
Basophils Absolute: 0 10*3/uL (ref 0.0–0.1)
Eosinophils Absolute: 0 10*3/uL (ref 0.0–0.7)
Eosinophils Relative: 0 % (ref 0–5)
Lymphocytes Relative: 17 % (ref 12–46)
MCV: 94.7 fL (ref 78.0–100.0)
Platelets: 286 10*3/uL (ref 150–400)
RDW: 13.9 % (ref 11.5–15.5)
WBC: 5.1 10*3/uL (ref 4.0–10.5)

## 2012-05-15 LAB — COMPREHENSIVE METABOLIC PANEL
ALT: 27 U/L (ref 0–35)
AST: 30 U/L (ref 0–37)
Albumin: 4.2 g/dL (ref 3.5–5.2)
CO2: 25 mEq/L (ref 19–32)
Calcium: 9.6 mg/dL (ref 8.4–10.5)
Sodium: 144 mEq/L (ref 135–145)
Total Protein: 8 g/dL (ref 6.0–8.3)

## 2012-05-15 MED ORDER — PROMETHAZINE HCL 25 MG/ML IJ SOLN
12.5000 mg | Freq: Once | INTRAMUSCULAR | Status: AC
  Administered 2012-05-15: 12.5 mg via INTRAVENOUS
  Filled 2012-05-15: qty 1

## 2012-05-15 MED ORDER — PROMETHAZINE HCL 25 MG PO TABS: 25.0000 mg | ORAL_TABLET | Freq: Four times a day (QID) | ORAL | Status: DC | PRN

## 2012-05-15 MED ORDER — ONDANSETRON HCL 4 MG/2ML IJ SOLN
4.0000 mg | Freq: Once | INTRAMUSCULAR | Status: AC
Start: 2012-05-15 — End: 2012-05-15
  Administered 2012-05-15: 4 mg via INTRAVENOUS
  Filled 2012-05-15: qty 2

## 2012-05-15 MED ORDER — SODIUM CHLORIDE 0.9 % IV BOLUS (SEPSIS)
1000.0000 mL | Freq: Once | INTRAVENOUS | Status: AC
  Administered 2012-05-15: 1000 mL via INTRAVENOUS

## 2012-05-15 MED ORDER — MORPHINE SULFATE 4 MG/ML IJ SOLN
4.0000 mg | Freq: Once | INTRAMUSCULAR | Status: AC
  Administered 2012-05-15: 4 mg via INTRAVENOUS
  Filled 2012-05-15: qty 1

## 2012-05-15 MED ORDER — FAMOTIDINE IN NACL 20-0.9 MG/50ML-% IV SOLN
20.0000 mg | Freq: Once | INTRAVENOUS | Status: AC
  Administered 2012-05-15: 20 mg via INTRAVENOUS
  Filled 2012-05-15: qty 50

## 2012-05-15 NOTE — ED Provider Notes (Addendum)
History     CSN: [id number]  Arrival date & time 05/15/12  1045   First MD Initiated Contact with Patient 05/15/12 1104      Chief Complaint  Patient presents with  . Emesis    (Consider location/radiation/quality/duration/timing/severity/associated sxs/prior treatment) Patient is a 52 y.o. female presenting with vomiting. The history is provided by the patient.  Emesis  This is a new problem. The current episode started yesterday. The problem occurs more than 10 times per day. The problem has not changed since onset.The emesis has an appearance of stomach contents. There has been no fever. Associated symptoms include abdominal pain, chills, diarrhea and myalgias. Pertinent negatives include no cough and no URI. Risk factors: none.    Past Medical History  Diagnosis Date  . Depression   . Migraine   . Fibromyalgia   . Hearing impairment   . Arthritis   . Cardiomyopathy   . Pulmonary embolism   . Gastritis     Past Surgical History  Procedure Date  . Appendectomy   . Inner ear surgery     TUBES  . Open heart surgery     2  . Tonsillectomy and adenoidectomy   . Tubal ligation     Family History  Problem Relation Age of Onset  . Cancer Mother   . Breast cancer Mother   . Depression Sister   . Heart disease Father   . Hypertension Father     History  Substance Use Topics  . Smoking status: Never Smoker   . Smokeless tobacco: Not on file  . Alcohol Use: No    OB History    Grav Para Term Preterm Abortions TAB SAB Ect Mult Living   2 2 2       2       Review of Systems  Constitutional: Positive for chills.  Respiratory: Negative for cough and shortness of breath.   Cardiovascular: Negative for chest pain.  Gastrointestinal: Positive for vomiting, abdominal pain and diarrhea.  Musculoskeletal: Positive for myalgias.  All other systems reviewed and are negative.    Allergies  Valium and Diazepam  Home Medications   Current Outpatient Rx  Name   Route  Sig  Dispense  Refill  . ARIPIPRAZOLE 10 MG PO TABS   Oral   Take 1 tablet (10 mg total) by mouth daily.   30 tablet   1   . ASPIRIN 81 MG PO TABS   Oral   Take 81 mg by mouth daily.           . BUPROPION HCL ER (XL) 450 MG PO TB24   Oral   Take 450 mg by mouth daily.   30 tablet   1   . CALCIUM CARBONATE-VITAMIN D 500-200 MG-UNIT PO TABS   Oral   Take 1 tablet by mouth 2 (two) times daily.         [name] CARVEDILOL 25 MG PO TABS   Oral   Take 25 mg by mouth 2 (two) times daily with a meal.         . DESVENLAFAXINE SUCCINATE ER 100 MG PO TB24   Oral   Take 1 tablet (100 mg total) by mouth daily.   30 tablet   1   . OMEGA-3 FATTY ACIDS 1000 MG PO CAPS   Oral   Take 1 g by mouth daily.           [name] HYDROCHLOROTHIAZIDE 12.5 MG PO CAPS   Oral  Take 12.5 mg by mouth daily.         [name] ONE-DAILY MULTI VITAMINS PO TABS   Oral   Take 1 tablet by mouth daily.           [name] OMEPRAZOLE 20 MG PO CPDR   Oral   Take 1 capsule (20 mg total) by mouth 2 (two) times daily.   30 capsule   0   . PANTOPRAZOLE SODIUM 20 MG PO TBEC   Oral   Take 1 tablet (20 mg total) by mouth daily.   30 tablet   0   . PROMETHAZINE HCL 25 MG PO TABS   Oral   Take 25 mg by mouth every 8 (eight) hours as needed. For nausea         . RANITIDINE HCL 150 MG PO TABS   Oral   Take 1 tablet (150 mg total) by mouth 2 (two) times daily.   30 tablet   0     BP 129/88  Pulse 97  Temp 99.1 F (37.3 C) (Oral)  Resp 18  Ht 5' 5.5" (1.664 m)  Wt 154 lb (69.854 kg)  BMI 25.24 kg/m2  SpO2 98%  Physical Exam  Nursing note and vitals reviewed. Constitutional: She is oriented to person, place, and time. She appears well-developed and well-nourished. No distress.  HENT:  Head: Normocephalic and atraumatic.  Mouth/Throat: Oropharynx is clear and moist.  Eyes: Conjunctivae normal and EOM are normal. Pupils are equal, round, and reactive to light.  Neck: Normal range of motion. Neck  supple.  Cardiovascular: Normal rate, regular rhythm and intact distal pulses.   No murmur heard. Pulmonary/Chest: Effort normal and breath sounds normal. No respiratory distress. She has no wheezes. She has no rales.  Abdominal: Soft. Normal appearance. She exhibits no distension. There is tenderness in the epigastric area. There is guarding. There is no rebound.  Musculoskeletal: Normal range of motion. She exhibits no edema and no tenderness.  Neurological: She is alert and oriented to person, place, and time.  Skin: Skin is warm and dry. No rash noted. No erythema.  Psychiatric: She has a normal mood and affect. Her behavior is normal.    ED Course  Procedures (including critical care time)  Labs Reviewed  COMPREHENSIVE METABOLIC PANEL - Abnormal; Notable for the following:    Total Bilirubin 0.2 (*)     GFR calc non Af Amer 64 (*)     GFR calc Af Amer 74 (*)     All other components within normal limits  CBC WITH DIFFERENTIAL  LIPASE, BLOOD   No results found.   Date: 05/15/2012  Rate: 92  Rhythm: normal sinus rhythm  QRS Axis: normal  Intervals: Prolonged QT  ST/T Wave abnormalities: nonspecific ST changes  Conduction Disutrbances:none  Narrative Interpretation:   Old EKG Reviewed: unchanged    1. Vomiting and diarrhea   2. Epigastric pain       MDM   Pt with symptoms most consistent with a viral process with fever/vomitting/diarrhea.  Denies bad food exposure and recent travel out of the country.  No recent abx.  No hx concerning for GU pathology or kidney stones.  Pt is awake and alert on exam without peritoneal signs.  Labs all wnl.  12:07 PM On recheck patient still feeling nauseated and complaining of epigastric pain. She had a second episode of vomiting. She was given morphine and Phenergan and will recheck. No symptoms suggestive of appendicitis, diverticulitis or pancreatitis. Patient  is status post cholecystectomy.  1:54 PM Urine out tolerating  fluids. She complains of minimal abdominal pain at this time. We'll discharge home with anti-medics and return if symptoms worsen.      Gwyneth Sprout, MD 05/15/12 1354  Gwyneth Sprout, MD 05/15/12 1357

## 2012-05-15 NOTE — ED Notes (Signed)
Vomiting since 3am. Was feeling bad yesterday and did not take her Coreg. Missed the dose today as well. States her chest feels shaky.

## 2012-05-16 DIAGNOSIS — F411 Generalized anxiety disorder: Secondary | ICD-10-CM | POA: Diagnosis not present

## 2012-05-16 DIAGNOSIS — F339 Major depressive disorder, recurrent, unspecified: Secondary | ICD-10-CM | POA: Diagnosis not present

## 2012-05-19 ENCOUNTER — Encounter (HOSPITAL_BASED_OUTPATIENT_CLINIC_OR_DEPARTMENT_OTHER): Payer: Self-pay

## 2012-05-19 ENCOUNTER — Emergency Department (HOSPITAL_BASED_OUTPATIENT_CLINIC_OR_DEPARTMENT_OTHER): Payer: Medicare Other

## 2012-05-19 ENCOUNTER — Emergency Department (HOSPITAL_BASED_OUTPATIENT_CLINIC_OR_DEPARTMENT_OTHER)
Admission: EM | Admit: 2012-05-19 | Discharge: 2012-05-19 | Disposition: A | Discharge status: Home / Self Care | Payer: Medicare Other | Attending: Emergency Medicine | Admitting: Emergency Medicine

## 2012-05-19 DIAGNOSIS — Z86711 Personal history of pulmonary embolism: Secondary | ICD-10-CM | POA: Insufficient documentation

## 2012-05-19 DIAGNOSIS — R197 Diarrhea, unspecified: Secondary | ICD-10-CM | POA: Insufficient documentation

## 2012-05-19 DIAGNOSIS — F329 Major depressive disorder, single episode, unspecified: Secondary | ICD-10-CM | POA: Insufficient documentation

## 2012-05-19 DIAGNOSIS — Z8719 Personal history of other diseases of the digestive system: Secondary | ICD-10-CM | POA: Insufficient documentation

## 2012-05-19 DIAGNOSIS — R1110 Vomiting, unspecified: Secondary | ICD-10-CM | POA: Diagnosis not present

## 2012-05-19 DIAGNOSIS — F3289 Other specified depressive episodes: Secondary | ICD-10-CM | POA: Diagnosis not present

## 2012-05-19 DIAGNOSIS — Z8679 Personal history of other diseases of the circulatory system: Secondary | ICD-10-CM | POA: Diagnosis not present

## 2012-05-19 DIAGNOSIS — Z79899 Other long term (current) drug therapy: Secondary | ICD-10-CM | POA: Insufficient documentation

## 2012-05-19 DIAGNOSIS — K449 Diaphragmatic hernia without obstruction or gangrene: Secondary | ICD-10-CM | POA: Diagnosis not present

## 2012-05-19 DIAGNOSIS — R109 Unspecified abdominal pain: Secondary | ICD-10-CM | POA: Diagnosis not present

## 2012-05-19 DIAGNOSIS — R112 Nausea with vomiting, unspecified: Secondary | ICD-10-CM | POA: Diagnosis not present

## 2012-05-19 DIAGNOSIS — Z7982 Long term (current) use of aspirin: Secondary | ICD-10-CM | POA: Insufficient documentation

## 2012-05-19 DIAGNOSIS — Z8739 Personal history of other diseases of the musculoskeletal system and connective tissue: Secondary | ICD-10-CM | POA: Diagnosis not present

## 2012-05-19 LAB — URINALYSIS, ROUTINE W REFLEX MICROSCOPIC
Bilirubin Urine: NEGATIVE
Nitrite: NEGATIVE
Protein, ur: NEGATIVE mg/dL
Specific Gravity, Urine: 1.022 (ref 1.005–1.030)
Urobilinogen, UA: 0.2 mg/dL (ref 0.0–1.0)

## 2012-05-19 LAB — COMPREHENSIVE METABOLIC PANEL
Alkaline Phosphatase: 77 U/L (ref 39–117)
BUN: 22 mg/dL (ref 6–23)
CO2: 26 mEq/L (ref 19–32)
Calcium: 9.4 mg/dL (ref 8.4–10.5)
GFR calc Af Amer: 74 mL/min — ABNORMAL LOW (ref >90)
GFR calc non Af Amer: 64 mL/min — ABNORMAL LOW (ref >90)
Glucose, Bld: 106 mg/dL — ABNORMAL HIGH (ref 70–99)
Potassium: 4.3 mEq/L (ref 3.5–5.1)
Total Protein: 7.2 g/dL (ref 6.0–8.3)

## 2012-05-19 LAB — CBC WITH DIFFERENTIAL/PLATELET
Eosinophils Absolute: 0 10*3/uL (ref 0.0–0.7)
Eosinophils Relative: 1 % (ref 0–5)
Hemoglobin: 14.2 g/dL (ref 12.0–15.0)
Lymphs Abs: 0.8 10*3/uL (ref 0.7–4.0)
MCH: 32.1 pg (ref 26.0–34.0)
MCV: 93 fL (ref 78.0–100.0)
Monocytes Relative: 10 % (ref 3–12)
RBC: 4.42 MIL/uL (ref 3.87–5.11)

## 2012-05-19 LAB — LIPASE, BLOOD: Lipase: 39 U/L (ref 11–59)

## 2012-05-19 LAB — URINE MICROSCOPIC-ADD ON

## 2012-05-19 MED ORDER — ONDANSETRON HCL 4 MG/2ML IJ SOLN
4.0000 mg | Freq: Once | INTRAMUSCULAR | Status: AC
  Administered 2012-05-19: 4 mg via INTRAVENOUS
  Filled 2012-05-19: qty 2

## 2012-05-19 MED ORDER — HYDROMORPHONE HCL PF 1 MG/ML IJ SOLN
0.5000 mg | Freq: Once | INTRAMUSCULAR | Status: AC
  Administered 2012-05-19: 0.5 mg via INTRAVENOUS
  Filled 2012-05-19: qty 1

## 2012-05-19 MED ORDER — IOHEXOL 300 MG/ML  SOLN
100.0000 mL | Freq: Once | INTRAMUSCULAR | Status: AC | PRN
  Administered 2012-05-19: 100 mL via INTRAVENOUS

## 2012-05-19 MED ORDER — ONDANSETRON 4 MG PO TBDP: 4.0000 mg | ORAL_TABLET | Freq: Three times a day (TID) | ORAL | Status: DC | PRN

## 2012-05-19 MED ORDER — IOHEXOL 300 MG/ML  SOLN: 50.0000 mL | Freq: Once | INTRAMUSCULAR | Status: DC | PRN

## 2012-05-19 NOTE — ED Provider Notes (Signed)
History     CSN: [id number]  Arrival date & time 05/19/12  1157   First MD Initiated Contact with Patient 05/19/12 1224      Chief Complaint  Patient presents with  . Emesis  . Diarrhea    (Consider location/radiation/quality/duration/timing/severity/associated sxs/prior treatment) Patient is a 52 y.o. female presenting with abdominal pain. The history is provided by the patient. No language interpreter was used.  Abdominal Pain The primary symptoms of the illness include abdominal pain, nausea and vomiting. The current episode started 6 to 12 hours ago. The onset of the illness was gradual. The problem has been gradually worsening.  The patient states that she believes she is currently not pregnant. The patient has not had a change in bowel habit. Significant associated medical issues do not include PUD or inflammatory bowel disease.  Pt complains of upper abdominal pain/   Pt reports she had the same on Monday and was seen here.  Pt reports she was feeling better but symptoms returned today.  Past Medical History  Diagnosis Date  . Depression   . Migraine   . Fibromyalgia   . Hearing impairment   . Arthritis   . Cardiomyopathy   . Pulmonary embolism   . Gastritis     Past Surgical History  Procedure Date  . Appendectomy   . Inner ear surgery     TUBES  . Open heart surgery     2  . Tonsillectomy and adenoidectomy   . Tubal ligation     Family History  Problem Relation Age of Onset  . Cancer Mother   . Breast cancer Mother   . Depression Sister   . Heart disease Father   . Hypertension Father     History  Substance Use Topics  . Smoking status: Never Smoker   . Smokeless tobacco: Not on file  . Alcohol Use: No    OB History    Grav Para Term Preterm Abortions TAB SAB Ect Mult Living   2 2 2       2       Review of Systems  Gastrointestinal: Positive for nausea, vomiting and abdominal pain.  All other systems reviewed and are  negative.    Allergies  Valium and Diazepam  Home Medications   Current Outpatient Rx  Name  Route  Sig  Dispense  Refill  . ARIPIPRAZOLE 10 MG PO TABS   Oral   Take 1 tablet (10 mg total) by mouth daily.   30 tablet   1   . ASPIRIN 81 MG PO TABS   Oral   Take 81 mg by mouth daily.           . BUPROPION HCL ER (XL) 450 MG PO TB24   Oral   Take 450 mg by mouth daily.   30 tablet   1   . CALCIUM CARBONATE-VITAMIN D 500-200 MG-UNIT PO TABS   Oral   Take 1 tablet by mouth 2 (two) times daily.         [name] CARVEDILOL 25 MG PO TABS   Oral   Take 25 mg by mouth 2 (two) times daily with a meal.         . DESVENLAFAXINE SUCCINATE ER 100 MG PO TB24   Oral   Take 1 tablet (100 mg total) by mouth daily.   30 tablet   1   . OMEGA-3 FATTY ACIDS 1000 MG PO CAPS   Oral   Take 1 g  by mouth daily.           [name] HYDROCHLOROTHIAZIDE 12.5 MG PO CAPS   Oral   Take 12.5 mg by mouth daily.         [name] ONE-DAILY MULTI VITAMINS PO TABS   Oral   Take 1 tablet by mouth daily.           [name] PROMETHAZINE HCL 25 MG PO TABS   Oral   Take 1 tablet (25 mg total) by mouth every 6 (six) hours as needed for nausea.   30 tablet   0   . OMEPRAZOLE 20 MG PO CPDR   Oral   Take 1 capsule (20 mg total) by mouth 2 (two) times daily.   30 capsule   0   . PANTOPRAZOLE SODIUM 20 MG PO TBEC   Oral   Take 1 tablet (20 mg total) by mouth daily.   30 tablet   0   . PROMETHAZINE HCL 25 MG PO TABS   Oral   Take 25 mg by mouth every 8 (eight) hours as needed. For nausea         . RANITIDINE HCL 150 MG PO TABS   Oral   Take 1 tablet (150 mg total) by mouth 2 (two) times daily.   30 tablet   0     BP 147/100  Pulse 106  Temp 99.3 F (37.4 C) (Oral)  Resp 24  Ht 5\' 5"  (1.651 m)  Wt 155 lb (70.308 kg)  BMI 25.79 kg/m2  SpO2 99%  Physical Exam  Nursing note and vitals reviewed. Constitutional: She is oriented to person, place, and time. She appears well-developed and  well-nourished.  HENT:  Head: Normocephalic and atraumatic.  Right Ear: External ear normal.  Left Ear: External ear normal.  Nose: Nose normal.  Mouth/Throat: Oropharynx is clear and moist.  Eyes: Conjunctivae normal and EOM are normal. Pupils are equal, round, and reactive to light.  Neck: Normal range of motion.  Cardiovascular: Normal rate and normal heart sounds.   Pulmonary/Chest: Effort normal and breath sounds normal.  Abdominal: Soft. There is tenderness. There is guarding.  Musculoskeletal: Normal range of motion.  Neurological: She is alert and oriented to person, place, and time. She has normal reflexes.  Psychiatric: She has a normal mood and affect.    ED Course  Procedures (including critical care time)   Labs Reviewed  CBC WITH DIFFERENTIAL  COMPREHENSIVE METABOLIC PANEL  LIPASE, BLOOD  URINALYSIS, ROUTINE W REFLEX MICROSCOPIC   No results found.   No diagnosis found.    MDM   Results for orders placed during the hospital encounter of 05/19/12  CBC WITH DIFFERENTIAL      Component Value Range   WBC 4.6  4.0 - 10.5 K/uL   RBC 4.42  3.87 - 5.11 MIL/uL   Hemoglobin 14.2  12.0 - 15.0 g/dL   HCT 16.1  09.6 - 04.5 %   MCV 93.0  78.0 - 100.0 fL   MCH 32.1  26.0 - 34.0 pg   MCHC 34.5  30.0 - 36.0 g/dL   RDW 40.9  81.1 - 91.4 %   Platelets 208  150 - 400 K/uL   Neutrophils Relative 72  43 - 77 %   Neutro Abs 3.3  1.7 - 7.7 K/uL   Lymphocytes Relative 17  12 - 46 %   Lymphs Abs 0.8  0.7 - 4.0 K/uL   Monocytes Relative 10  3 - 12 %  Monocytes Absolute 0.5  0.1 - 1.0 K/uL   Eosinophils Relative 1  0 - 5 %   Eosinophils Absolute 0.0  0.0 - 0.7 K/uL   Basophils Relative 0  0 - 1 %   Basophils Absolute 0.0  0.0 - 0.1 K/uL  COMPREHENSIVE METABOLIC PANEL      Component Value Range   Sodium 144  135 - 145 mEq/L   Potassium 4.3  3.5 - 5.1 mEq/L   Chloride 105  96 - 112 mEq/L   CO2 26  19 - 32 mEq/L   Glucose, Bld 106 (*) 70 - 99 mg/dL   BUN 22  6 - 23  mg/dL   Creatinine, Ser 2.13  0.50 - 1.10 mg/dL   Calcium 9.4  8.4 - 08.6 mg/dL   Total Protein 7.2  6.0 - 8.3 g/dL   Albumin 4.0  3.5 - 5.2 g/dL   AST 20  0 - 37 U/L   ALT 19  0 - 35 U/L   Alkaline Phosphatase 77  39 - 117 U/L   Total Bilirubin 0.3  0.3 - 1.2 mg/dL   GFR calc non Af Amer 64 (*) >90 mL/min   GFR calc Af Amer 74 (*) >90 mL/min  LIPASE, BLOOD      Component Value Range   Lipase 39  11 - 59 U/L  URINALYSIS, ROUTINE W REFLEX MICROSCOPIC      Component Value Range   Color, Urine YELLOW  YELLOW   APPearance CLEAR  CLEAR   Specific Gravity, Urine 1.022  1.005 - 1.030   pH 5.5  5.0 - 8.0   Glucose, UA NEGATIVE  NEGATIVE mg/dL   Hgb urine dipstick SMALL (*) NEGATIVE   Bilirubin Urine NEGATIVE  NEGATIVE   Ketones, ur NEGATIVE  NEGATIVE mg/dL   Protein, ur NEGATIVE  NEGATIVE mg/dL   Urobilinogen, UA 0.2  0.0 - 1.0 mg/dL   Nitrite NEGATIVE  NEGATIVE   Leukocytes, UA NEGATIVE  NEGATIVE  URINE MICROSCOPIC-ADD ON      Component Value Range   Squamous Epithelial / LPF RARE  RARE   WBC, UA 0-2  <3 WBC/hpf   RBC / HPF 0-2  <3 RBC/hpf   Bacteria, UA RARE  RARE   Ct Abdomen Pelvis W Contrast  05/19/2012  *RADIOLOGY REPORT*  Clinical Data: Pain  CT ABDOMEN AND PELVIS WITH CONTRAST  Technique:  Multidetector CT imaging of the abdomen and pelvis was performed following the standard protocol during bolus administration of intravenous contrast.  Contrast:  100 ml Omnipaque-300  Comparison: None.  Findings: A small Bochdalek hernia containing adipose in the left posterior hemithorax.  Diffuse hepatic steatosis.  Post cholecystectomy.  Spleen, pancreas, adrenal glands are within normal limits. Scarring in the lower pole of the right kidney.  Moderate sized hiatal hernia is present.  Uterus, adnexa, and bladder are within normal limits.  No free fluid.  No abnormal adenopathy.  No focal bowel wall thickening to suggest an acute inflammatory process.  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  No obvious spinal stenosis.  IMPRESSION: Moderate hiatal hernia.  No acute intra-abdominal pathology.   Original Report Authenticated By: Jolaine Click, M.D.       Pt given pain medication and nausea medications,   Labs and ct normal except hiatal hernia.  Pt advised she needs to follow up with her MD.   Tylenol for pain.   Pt given rx for zofran  Lonia Skinner [place], [place] 05/19/12 1526

## 2012-05-19 NOTE — ED Notes (Signed)
Pt states that she had n/v/d and c/o palpitations, pt states that she had same s/sx on Monday that resolved but restarted at 0600 this morning.  Pt states that she is more severely nauseated today.

## 2012-05-19 NOTE — ED Provider Notes (Signed)
Medical screening examination/treatment/procedure(s) were performed by non-physician practitioner and as supervising physician I was immediately available for consultation/collaboration.   Cyntha Brickman, MD 05/19/12 1536 

## 2012-05-24 ENCOUNTER — Ambulatory Visit (INDEPENDENT_AMBULATORY_CARE_PROVIDER_SITE_OTHER): Payer: Medicare Other | Admitting: Physician Assistant

## 2012-05-24 DIAGNOSIS — F411 Generalized anxiety disorder: Secondary | ICD-10-CM | POA: Diagnosis not present

## 2012-05-24 DIAGNOSIS — F332 Major depressive disorder, recurrent severe without psychotic features: Secondary | ICD-10-CM

## 2012-05-24 NOTE — Progress Notes (Signed)
   [facility] Follow-up Outpatient Visit  [name] 11-10-1960  Date: 05/24/2012   Subjective: [name] presents today to followup on her treatment for depression and anxiety. She states that she had a right Christmas, as there was a lot of family drama. Her sister and niece came to visit, and her niece had recently been discharged from a [facility] can't. She also had to travel to [place], and back and forth from her mothers home. She reports that the stress caused increased anxiety, and she began to notice she was using a lot of negative self talk. She reports that this past weekend she turned a corner and is now improving. She [facility] the [facility] [employer] over [place], and looks forward to returning as she enjoyed the classes. She reports that she is sleeping well, but sometimes she wants to sleep too much do to boredom. She denies any suicidal or homicidal ideation. She denies any auditory or visual hallucinations.  There were no vitals filed for this visit.  Mental Status Examination  Appearance: Well groomed and casually dressed Alert: Yes Attention: good  Cooperative: Yes Eye Contact: Good Speech: Clear and coherent Psychomotor Activity: Normal Memory/Concentration: Normal Oriented: person, place, time/date and situation Mood: Dysphoric Affect: Congruent Thought Processes and Associations: Linear Fund of Knowledge: Good Thought Content: Normal Insight: Good Judgement: Good  Diagnosis: Generalized anxiety disorder; major depressive disorder, recurrent, moderate  Treatment Plan: We will continue her Abilify 10 mg daily, Wellbutrin XL 450 mg daily, Pristiq 100 mg daily. She will return for followup in 2 months.  Alaina Donati, PA-C

## 2012-06-16 DIAGNOSIS — L259 Unspecified contact dermatitis, unspecified cause: Secondary | ICD-10-CM | POA: Diagnosis not present

## 2012-06-16 DIAGNOSIS — G43909 Migraine, unspecified, not intractable, without status migrainosus: Secondary | ICD-10-CM | POA: Diagnosis not present

## 2012-06-16 DIAGNOSIS — S139XXA Sprain of joints and ligaments of unspecified parts of neck, initial encounter: Secondary | ICD-10-CM | POA: Diagnosis not present

## 2012-06-19 ENCOUNTER — Encounter: Payer: Self-pay | Admitting: Gynecology

## 2012-06-19 DIAGNOSIS — Z1231 Encounter for screening mammogram for malignant neoplasm of breast: Secondary | ICD-10-CM | POA: Diagnosis not present

## 2012-06-22 ENCOUNTER — Encounter: Payer: Self-pay | Admitting: Gynecology

## 2012-06-27 DIAGNOSIS — S335XXA Sprain of ligaments of lumbar spine, initial encounter: Secondary | ICD-10-CM | POA: Diagnosis not present

## 2012-06-28 DIAGNOSIS — F411 Generalized anxiety disorder: Secondary | ICD-10-CM | POA: Diagnosis not present

## 2012-06-28 DIAGNOSIS — F319 Bipolar disorder, unspecified: Secondary | ICD-10-CM | POA: Diagnosis not present

## 2012-06-29 ENCOUNTER — Emergency Department (HOSPITAL_BASED_OUTPATIENT_CLINIC_OR_DEPARTMENT_OTHER)
Admission: EM | Admit: 2012-06-29 | Discharge: 2012-06-29 | Disposition: A | Discharge status: Home / Self Care | Payer: Medicare Other | Attending: Emergency Medicine | Admitting: Emergency Medicine

## 2012-06-29 ENCOUNTER — Encounter (HOSPITAL_BASED_OUTPATIENT_CLINIC_OR_DEPARTMENT_OTHER): Payer: Self-pay

## 2012-06-29 ENCOUNTER — Emergency Department (HOSPITAL_BASED_OUTPATIENT_CLINIC_OR_DEPARTMENT_OTHER): Payer: Medicare Other

## 2012-06-29 DIAGNOSIS — M5137 Other intervertebral disc degeneration, lumbosacral region: Secondary | ICD-10-CM | POA: Diagnosis not present

## 2012-06-29 DIAGNOSIS — F329 Major depressive disorder, single episode, unspecified: Secondary | ICD-10-CM | POA: Diagnosis not present

## 2012-06-29 DIAGNOSIS — Z8719 Personal history of other diseases of the digestive system: Secondary | ICD-10-CM | POA: Insufficient documentation

## 2012-06-29 DIAGNOSIS — Z7982 Long term (current) use of aspirin: Secondary | ICD-10-CM | POA: Insufficient documentation

## 2012-06-29 DIAGNOSIS — R319 Hematuria, unspecified: Secondary | ICD-10-CM | POA: Diagnosis not present

## 2012-06-29 DIAGNOSIS — Z8669 Personal history of other diseases of the nervous system and sense organs: Secondary | ICD-10-CM | POA: Diagnosis not present

## 2012-06-29 DIAGNOSIS — Z79899 Other long term (current) drug therapy: Secondary | ICD-10-CM | POA: Insufficient documentation

## 2012-06-29 DIAGNOSIS — Z86711 Personal history of pulmonary embolism: Secondary | ICD-10-CM | POA: Diagnosis not present

## 2012-06-29 DIAGNOSIS — Z8679 Personal history of other diseases of the circulatory system: Secondary | ICD-10-CM | POA: Diagnosis not present

## 2012-06-29 DIAGNOSIS — M545 Low back pain, unspecified: Secondary | ICD-10-CM | POA: Insufficient documentation

## 2012-06-29 DIAGNOSIS — M47817 Spondylosis without myelopathy or radiculopathy, lumbosacral region: Secondary | ICD-10-CM | POA: Diagnosis not present

## 2012-06-29 DIAGNOSIS — M549 Dorsalgia, unspecified: Secondary | ICD-10-CM

## 2012-06-29 DIAGNOSIS — F3289 Other specified depressive episodes: Secondary | ICD-10-CM | POA: Insufficient documentation

## 2012-06-29 DIAGNOSIS — Z8739 Personal history of other diseases of the musculoskeletal system and connective tissue: Secondary | ICD-10-CM | POA: Insufficient documentation

## 2012-06-29 LAB — URINALYSIS, ROUTINE W REFLEX MICROSCOPIC
Bilirubin Urine: NEGATIVE
Glucose, UA: NEGATIVE mg/dL
Ketones, ur: NEGATIVE mg/dL
pH: 6 (ref 5.0–8.0)

## 2012-06-29 LAB — URINE MICROSCOPIC-ADD ON

## 2012-06-29 MED ORDER — KETOROLAC TROMETHAMINE 60 MG/2ML IM SOLN
60.0000 mg | Freq: Once | INTRAMUSCULAR | Status: DC
  Filled 2012-06-29: qty 2

## 2012-06-29 MED ORDER — METHOCARBAMOL 500 MG PO TABS
500.0000 mg | ORAL_TABLET | Freq: Once | ORAL | Status: AC
  Administered 2012-06-29: 500 mg via ORAL
  Filled 2012-06-29: qty 1

## 2012-06-29 MED ORDER — MAGNESIUM CITRATE PO SOLN: 296.0000 mL | Freq: Once | ORAL | Status: DC

## 2012-06-29 MED ORDER — OXYCODONE-ACETAMINOPHEN 5-325 MG PO TABS: 1.0000 | ORAL_TABLET | ORAL | Status: DC | PRN

## 2012-06-29 MED ORDER — KETOROLAC TROMETHAMINE 30 MG/ML IJ SOLN
60.0000 mg | Freq: Once | INTRAMUSCULAR | Status: AC
  Administered 2012-06-29: 60 mg via INTRAVENOUS

## 2012-06-29 MED ORDER — SODIUM CHLORIDE 0.9 % IV BOLUS (SEPSIS)
1000.0000 mL | Freq: Once | INTRAVENOUS | Status: AC
  Administered 2012-06-29: 1000 mL via INTRAVENOUS

## 2012-06-29 MED ORDER — MORPHINE SULFATE 4 MG/ML IJ SOLN
4.0000 mg | Freq: Once | INTRAMUSCULAR | Status: AC
  Administered 2012-06-29: 4 mg via INTRAVENOUS
  Filled 2012-06-29: qty 1

## 2012-06-29 MED ORDER — DOCUSATE SODIUM 100 MG PO CAPS: 100.0000 mg | ORAL_CAPSULE | Freq: Two times a day (BID) | ORAL | Status: DC

## 2012-06-29 MED ORDER — NAPROXEN 500 MG PO TABS: 500.0000 mg | ORAL_TABLET | Freq: Two times a day (BID) | ORAL | Status: DC

## 2012-06-29 MED ORDER — SENNA 8.6 MG PO TABS: 1.0000 | ORAL_TABLET | Freq: Every day | ORAL | Status: DC

## 2012-06-29 MED ORDER — HYDROMORPHONE HCL PF 2 MG/ML IJ SOLN: 2.0000 mg | Freq: Once | INTRAMUSCULAR | Status: DC

## 2012-06-29 MED ORDER — HYDROMORPHONE HCL PF 1 MG/ML IJ SOLN
1.0000 mg | Freq: Once | INTRAMUSCULAR | Status: AC
  Administered 2012-06-29: 1 mg via INTRAVENOUS
  Filled 2012-06-29: qty 1

## 2012-06-29 NOTE — ED Provider Notes (Signed)
History     CSN: [id number]  Arrival date & time 06/29/12  0740   First MD Initiated Contact with Patient 06/29/12 [phone]      Chief Complaint  Patient presents with  . Back Pain    (Consider location/radiation/quality/duration/timing/severity/associated sxs/prior treatment) HPI Comments: 52 yo female chief complaint back pain. Onset 2 days ago. Woke up with pain. No recent trauma, MVC, heavy lifting etc. Pain is located in right lateral lumbar region and does not radiate. Moderate pain worsened by bending and twisting of the lumbar spine. She denies any motor weakness or numbness in lower extremities. She has had no bladder or bowel incontinence. She has had no burning or itching with urination. She has a history of a bad back and intermittent back pain similar to her current episode but not quite this bad in past. She does not use injectable drugs, she has had no fever or chills. She has no personal history of cancer and she has not had recent weight loss etc. She was seen at night clinic and given injection, possibly Toradol, d/c with Fioricet and Flexeril which have not helped with her pain.  Patient is a 52 y.o. female presenting with back pain. The history is provided by the patient. No language interpreter was used.  Back Pain Location:  Lumbar spine Quality:  Stabbing Radiates to:  Does not radiate Timing:  Constant Context: not physical stress   Relieved by:  Nothing Worsened by:  Bending and deep breathing Ineffective treatments:  Muscle relaxants Associated symptoms: no abdominal pain, no bladder incontinence, no bowel incontinence, no chest pain, no dysuria, no fever, no headaches, no numbness and no weakness   Risk factors: no hx of cancer     Past Medical History  Diagnosis Date  . Depression   . Migraine   . Fibromyalgia   . Hearing impairment   . Arthritis   . Cardiomyopathy   . Pulmonary embolism   . Gastritis     Past Surgical History  Procedure Laterality  Date  . Appendectomy    . Inner ear surgery      TUBES  . Open heart surgery      2  . Tonsillectomy and adenoidectomy    . Tubal ligation      Family History  Problem Relation Age of Onset  . Cancer Mother   . Breast cancer Mother   . Depression Sister   . Heart disease Father   . Hypertension Father     History  Substance Use Topics  . Smoking status: Never Smoker   . Smokeless tobacco: Not on file  . Alcohol Use: No    OB History   Grav Para Term Preterm Abortions TAB SAB Ect Mult Living   2 2 2       2       Review of Systems  Constitutional: Negative for fever and chills.  HENT: Negative for sore throat and neck pain.   Eyes: Negative for visual disturbance.  Respiratory: Negative for cough and shortness of breath.   Cardiovascular: Negative for chest pain.  Gastrointestinal: Negative for nausea, vomiting, abdominal pain, diarrhea and bowel incontinence.  Genitourinary: Negative for bladder incontinence, dysuria and frequency.  Musculoskeletal: Positive for back pain.  Skin: Negative for rash.  Neurological: Negative for weakness, numbness and headaches.  Hematological: Negative for adenopathy.  Psychiatric/Behavioral: Negative for behavioral problems.    Allergies  Valium and Diazepam  Home Medications   Current Outpatient Rx  Name  Route  Sig  Dispense  Refill  . butalbital-acetaminophen-caffeine (FIORICET WITH CODEINE) 50-325-40-30 MG per capsule   Oral   Take 1 capsule by mouth every 4 (four) hours as needed for headache.         . cyclobenzaprine (FLEXERIL) 10 MG tablet   Oral   Take 10 mg by mouth 3 (three) times daily as needed for muscle spasms.         . ARIPiprazole (ABILIFY) 10 MG tablet   Oral   Take 1 tablet (10 mg total) by mouth daily.   30 tablet   1   . aspirin 81 MG tablet   Oral   Take 81 mg by mouth daily.           . calcium-vitamin D (OSCAL WITH D) 500-200 MG-UNIT per tablet   Oral   Take 1 tablet by mouth 2  (two) times daily.         . carvedilol (COREG) 25 MG tablet   Oral   Take 25 mg by mouth 2 (two) times daily with a meal.         . desvenlafaxine (PRISTIQ) 100 MG 24 hr tablet   Oral   Take 1 tablet (100 mg total) by mouth daily.   30 tablet   1   . fish oil-omega-3 fatty acids 1000 MG capsule   Oral   Take 1 g by mouth daily.           . hydrochlorothiazide (MICROZIDE) 12.5 MG capsule   Oral   Take 12.5 mg by mouth daily.         . Multiple Vitamin (MULTIVITAMIN) tablet   Oral   Take 1 tablet by mouth daily.           . naproxen (NAPROSYN) 500 MG tablet   Oral   Take 1 tablet (500 mg total) by mouth 2 (two) times daily with a meal.   30 tablet   0   . oxyCODONE-acetaminophen (PERCOCET) 5-325 MG per tablet   Oral   Take 1 tablet by mouth every 4 (four) hours as needed for pain.   20 tablet   0   . pantoprazole (PROTONIX) 20 MG tablet   Oral   Take 1 tablet (20 mg total) by mouth daily.   30 tablet   0   . promethazine (PHENERGAN) 25 MG tablet   Oral   Take 25 mg by mouth every 8 (eight) hours as needed. For nausea         . promethazine (PHENERGAN) 25 MG tablet   Oral   Take 1 tablet (25 mg total) by mouth every 6 (six) hours as needed for nausea.   30 tablet   0   . ranitidine (ZANTAC) 150 MG tablet   Oral   Take 1 tablet (150 mg total) by mouth 2 (two) times daily.   30 tablet   0     BP 128/96  Pulse 108  Temp(Src) 98.5 F (36.9 C) (Oral)  Resp 18  SpO2 100%  Physical Exam  Nursing note and vitals reviewed. Constitutional: She appears well-developed and well-nourished. No distress.  HENT:  Head: Normocephalic and atraumatic.  Mouth/Throat: Oropharynx is clear and moist. No oropharyngeal exudate.  Eyes: Conjunctivae and EOM are normal. Pupils are equal, round, and reactive to light. Right eye exhibits no discharge. Left eye exhibits no discharge. No scleral icterus.  Neck: Normal range of motion. Neck supple. No JVD present. No  thyromegaly  present.  Cardiovascular: Normal rate, regular rhythm, normal heart sounds and intact distal pulses.  Exam reveals no gallop and no friction rub.   No murmur heard. Pulmonary/Chest: Effort normal and breath sounds normal. No respiratory distress. She has no wheezes. She has no rales.  Abdominal: Soft. Bowel sounds are normal. She exhibits no distension and no mass. There is no tenderness.  Musculoskeletal: Normal range of motion. She exhibits no edema and no tenderness.       Cervical back: She exhibits no bony tenderness.       Thoracic back: She exhibits no bony tenderness.       Lumbar back: She exhibits no bony tenderness.       Back:  TTP here. There is no TTP along c, t, or l spine. ROM at l spine limited by pain.  Lymphadenopathy:    She has no cervical adenopathy.  Neurological: She is alert. Coordination normal.  Motor 5/5 in lower extremities. Sensation to light touch intact in lower extremities. DTR 2+ at patella and achilles bilaterally.  Skin: Skin is warm and dry. No rash noted. No erythema.  Psychiatric: She has a normal mood and affect. Her behavior is normal.    ED Course  Procedures (including critical care time)  Labs Reviewed  URINALYSIS, ROUTINE W REFLEX MICROSCOPIC - Abnormal; Notable for the following:    Hgb urine dipstick MODERATE (*)    All other components within normal limits  URINE MICROSCOPIC-ADD ON - Abnormal; Notable for the following:    Bacteria, UA FEW (*)    All other components within normal limits   Dg Lumbar Spine Complete  06/29/2012  *RADIOLOGY REPORT*  Clinical Data: Persistent back pain for several days.  LUMBAR SPINE - COMPLETE 4+ VIEW  Comparison: Abdominal pelvic CT 05/19/2012.  Acute abdominal series 11/29/2010.  Findings: There are five lumbar type vertebral bodies.  The alignment is stable with a convex left scoliosis.  There is a new superior endplate compression fracture at L1 with less than 20% loss of vertebral body  height.  No osseous retropulsion or widening of the interpedicular distance is identified.  No other acute osseous findings are identified.  The advanced degenerative disc disease and endplate sclerosis at L2-L3 are stable.  Lesser endplate degenerative changes at L4-L5 and fragmentation of the anterior-superior corner of the L4 vertebral body are unchanged.  IMPRESSION:  1.  Interval mild superior endplate compression deformity at L1. 2.  No other acute osseous findings.  Scoliosis and endplate degenerative changes are stable.   Original Report Authenticated By: Carey Bullocks, M.D.      1. Back pain   2. Hematuria       MDM  52 yo female with atraumatic lower back pain. No red flag signs or symptoms, no history of cancer, IV drug use, and a benign physical exam. DDx lumbar strain. Patient states she is unsure what will help with her pain. Will attempt IV analgesics and reassess for resolution of pain. Needs different outpatient regimen which includes NSAID and opioid. Needs 2-3 day follow up with primary care physician to resolution of her symptoms.  Patient has been reevaluated several times, she has had some improvement with IV opiate medications as well as intervenous Toradol. IV fluids have been given, urinalysis reviewed and shows [place] very small amount of hematuria. Without any urinary symptoms there is no indication to culture this or to do a CT scan to rule out a kidney stone. Again the patient's pain is  reproducible on exam and with range of motion and does not appear to be colicky pain.  I personally interpreted the x-rays done of the lumbar spine complete series showing that there is a superior and plate fracture of L1, no other acute findings, I discussed these findings with the patient as well, she was unaware that she had a rupture at this area however this does not correlate with the patient's location of her pain. Again the patient has no neurologic findings and appears stable for  discharge, she is expressed her understanding for indications for return.  Naprosyn, Percocet      Vida Roller, MD 06/29/12 1034

## 2012-06-29 NOTE — ED Notes (Signed)
Pt reports a 3 day history of back pain, seen by Urgent Care Tuesday and received pain medications but not effective.

## 2012-06-30 ENCOUNTER — Other Ambulatory Visit: Payer: Self-pay | Admitting: Family Medicine

## 2012-06-30 ENCOUNTER — Ambulatory Visit
Admission: RE | Admit: 2012-06-30 | Discharge: 2012-06-30 | Disposition: A | Discharge status: Home / Self Care | Payer: Medicare Other | Source: Ambulatory Visit | Attending: Family Medicine | Admitting: Family Medicine

## 2012-06-30 DIAGNOSIS — K5730 Diverticulosis of large intestine without perforation or abscess without bleeding: Secondary | ICD-10-CM | POA: Diagnosis not present

## 2012-06-30 DIAGNOSIS — M549 Dorsalgia, unspecified: Secondary | ICD-10-CM

## 2012-06-30 DIAGNOSIS — K449 Diaphragmatic hernia without obstruction or gangrene: Secondary | ICD-10-CM | POA: Diagnosis not present

## 2012-07-01 ENCOUNTER — Encounter (HOSPITAL_COMMUNITY): Payer: Self-pay | Admitting: Emergency Medicine

## 2012-07-01 ENCOUNTER — Emergency Department (HOSPITAL_COMMUNITY)
Admission: EM | Admit: 2012-07-01 | Discharge: 2012-07-01 | Disposition: A | Discharge status: Home / Self Care | Payer: Medicare Other | Attending: Emergency Medicine | Admitting: Emergency Medicine

## 2012-07-01 DIAGNOSIS — F329 Major depressive disorder, single episode, unspecified: Secondary | ICD-10-CM | POA: Diagnosis not present

## 2012-07-01 DIAGNOSIS — K2970 Gastritis, unspecified, without bleeding: Secondary | ICD-10-CM | POA: Diagnosis not present

## 2012-07-01 DIAGNOSIS — Z9089 Acquired absence of other organs: Secondary | ICD-10-CM | POA: Diagnosis not present

## 2012-07-01 DIAGNOSIS — IMO0002 Reserved for concepts with insufficient information to code with codable children: Secondary | ICD-10-CM | POA: Diagnosis not present

## 2012-07-01 DIAGNOSIS — F3289 Other specified depressive episodes: Secondary | ICD-10-CM | POA: Diagnosis not present

## 2012-07-01 DIAGNOSIS — Z79899 Other long term (current) drug therapy: Secondary | ICD-10-CM | POA: Diagnosis not present

## 2012-07-01 DIAGNOSIS — Z8679 Personal history of other diseases of the circulatory system: Secondary | ICD-10-CM | POA: Diagnosis not present

## 2012-07-01 DIAGNOSIS — G43909 Migraine, unspecified, not intractable, without status migrainosus: Secondary | ICD-10-CM | POA: Diagnosis not present

## 2012-07-01 DIAGNOSIS — Z9851 Tubal ligation status: Secondary | ICD-10-CM | POA: Diagnosis not present

## 2012-07-01 DIAGNOSIS — H9190 Unspecified hearing loss, unspecified ear: Secondary | ICD-10-CM | POA: Diagnosis not present

## 2012-07-01 DIAGNOSIS — Z7982 Long term (current) use of aspirin: Secondary | ICD-10-CM | POA: Insufficient documentation

## 2012-07-01 DIAGNOSIS — M129 Arthropathy, unspecified: Secondary | ICD-10-CM | POA: Diagnosis not present

## 2012-07-01 DIAGNOSIS — IMO0001 Reserved for inherently not codable concepts without codable children: Secondary | ICD-10-CM | POA: Diagnosis not present

## 2012-07-01 DIAGNOSIS — R109 Unspecified abdominal pain: Secondary | ICD-10-CM | POA: Insufficient documentation

## 2012-07-01 DIAGNOSIS — Z8719 Personal history of other diseases of the digestive system: Secondary | ICD-10-CM | POA: Diagnosis not present

## 2012-07-01 DIAGNOSIS — R10819 Abdominal tenderness, unspecified site: Secondary | ICD-10-CM | POA: Diagnosis not present

## 2012-07-01 DIAGNOSIS — Z86711 Personal history of pulmonary embolism: Secondary | ICD-10-CM | POA: Insufficient documentation

## 2012-07-01 DIAGNOSIS — K2990 Gastroduodenitis, unspecified, without bleeding: Secondary | ICD-10-CM | POA: Diagnosis not present

## 2012-07-01 DIAGNOSIS — R319 Hematuria, unspecified: Secondary | ICD-10-CM | POA: Diagnosis not present

## 2012-07-01 LAB — URINALYSIS, ROUTINE W REFLEX MICROSCOPIC
Bilirubin Urine: NEGATIVE
Glucose, UA: NEGATIVE mg/dL
Ketones, ur: NEGATIVE mg/dL
Nitrite: NEGATIVE
pH: 7.5 (ref 5.0–8.0)

## 2012-07-01 LAB — COMPREHENSIVE METABOLIC PANEL
AST: 22 U/L (ref 0–37)
Albumin: 3.4 g/dL — ABNORMAL LOW (ref 3.5–5.2)
Alkaline Phosphatase: 100 U/L (ref 39–117)
Chloride: 103 mEq/L (ref 96–112)
Creatinine, Ser: 0.7 mg/dL (ref 0.50–1.10)
Potassium: 4.3 mEq/L (ref 3.5–5.1)
Total Bilirubin: 0.5 mg/dL (ref 0.3–1.2)

## 2012-07-01 LAB — CBC WITH DIFFERENTIAL/PLATELET
Eosinophils Absolute: 0 10*3/uL (ref 0.0–0.7)
Eosinophils Relative: 0 % (ref 0–5)
Lymphs Abs: 0.4 10*3/uL — ABNORMAL LOW (ref 0.7–4.0)
MCH: 32.1 pg (ref 26.0–34.0)
MCV: 96.3 fL (ref 78.0–100.0)
Monocytes Absolute: 0.4 10*3/uL (ref 0.1–1.0)
Platelets: 181 10*3/uL (ref 150–400)
RBC: 3.77 MIL/uL — ABNORMAL LOW (ref 3.87–5.11)

## 2012-07-01 LAB — LIPASE, BLOOD: Lipase: 26 U/L (ref 11–59)

## 2012-07-01 MED ORDER — HYDROMORPHONE HCL PF 1 MG/ML IJ SOLN
1.0000 mg | Freq: Once | INTRAMUSCULAR | Status: AC
  Administered 2012-07-01: 1 mg via INTRAVENOUS
  Filled 2012-07-01: qty 1

## 2012-07-01 MED ORDER — OXYCODONE-ACETAMINOPHEN 5-325 MG PO TABS: 2.0000 | ORAL_TABLET | ORAL | Status: DC | PRN

## 2012-07-01 MED ORDER — PROMETHAZINE HCL 25 MG PO TABS: 25.0000 mg | ORAL_TABLET | Freq: Four times a day (QID) | ORAL | Status: DC | PRN

## 2012-07-01 MED ORDER — ONDANSETRON HCL 4 MG/2ML IJ SOLN
4.0000 mg | Freq: Once | INTRAMUSCULAR | Status: AC
  Administered 2012-07-01: 4 mg via INTRAVENOUS
  Filled 2012-07-01: qty 2

## 2012-07-01 MED ORDER — SODIUM CHLORIDE 0.9 % IV BOLUS (SEPSIS)
1000.0000 mL | Freq: Once | INTRAVENOUS | Status: AC
  Administered 2012-07-01: 1000 mL via INTRAVENOUS

## 2012-07-01 NOTE — ED Notes (Signed)
Per EMS - Lower back pain on Monday, saw PCP x 2 this wk, w/o resolution. Also T&R at [facility], sent home w/ pain meds. Pain continues. R/O'd kidney stones, no BM x 3 days. Pain right sided lower back pain w/o radiation, denies N/V/D, urine + for blood. Denies other Sx. New pain which started w/o an injury.

## 2012-07-01 NOTE — ED Notes (Signed)
Pt states she was seen at [facility] Wednesday, was xrayed and given laxatives to enhance BMs. Pt did have several bowel movements but not is having lower abdominal pain and bloating in her abdomen. Continues to take pain meds and has not had BM since Wednesday.

## 2012-07-01 NOTE — ED Notes (Signed)
RUE:[id number]<JW> Expected date:07/01/12<BR> Expected time: 8:45 AM<BR> Means of arrival:Ambulance<BR> Comments:<BR> abd pain

## 2012-07-01 NOTE — ED Provider Notes (Signed)
History     CSN: [id number]  Arrival date & time 07/01/12  0845   First MD Initiated Contact with Patient 07/01/12 [phone]      Chief Complaint  Patient presents with  . Back Pain    (Consider location/radiation/quality/duration/timing/severity/associated sxs/prior treatment) HPI.... right flank pain since Tuesday.  CT scan yesterday showed no acute abnormalities. Small amount of hemoglobin noted in urinalysis. No fever, chills, nausea, vomiting, diarrhea, weight loss, sweats.  Nothing makes symptoms better or worse. She is able to walk. Severity is mild to moderate. No radiation  Past Medical History  Diagnosis Date  . Depression   . Migraine   . Fibromyalgia   . Hearing impairment   . Arthritis   . Cardiomyopathy   . Pulmonary embolism   . Gastritis     Past Surgical History  Procedure Laterality Date  . Appendectomy    . Inner ear surgery      TUBES  . Tonsillectomy and adenoidectomy    . Tubal ligation    . Cardiac surgery  January 2007    mitral valve repair  . Cardiac surgery  1973    atrial septal defect    Family History  Problem Relation Age of Onset  . Cancer Mother   . Breast cancer Mother   . Depression Sister   . Heart disease Father   . Hypertension Father     History  Substance Use Topics  . Smoking status: Never Smoker   . Smokeless tobacco: Not on file  . Alcohol Use: No    OB History   Grav Para Term Preterm Abortions TAB SAB Ect Mult Living   2 2 2       2       Review of Systems  All other systems reviewed and are negative.    Allergies  Depakote and Valium  Home Medications   Current Outpatient Rx  Name  Route  Sig  Dispense  Refill  . ARIPiprazole (ABILIFY) 10 MG tablet   Oral   Take 10 mg by mouth every morning.         [name] aspirin EC 81 MG tablet   Oral   Take 81 mg by mouth every morning.         . BuPROPion HCl ER, XL, (FORFIVO XL) 450 MG TB24   Oral   Take 450 mg by mouth every morning.         .  calcium-vitamin D (OSCAL WITH D) 500-200 MG-UNIT per tablet   Oral   Take 1 tablet by mouth 2 (two) times daily.         . carvedilol (COREG) 25 MG tablet   Oral   Take 25 mg by mouth 2 (two) times daily with a meal.         . desvenlafaxine (PRISTIQ) 100 MG 24 hr tablet   Oral   Take 100 mg by mouth every morning.         . fish oil-omega-3 fatty acids 1000 MG capsule   Oral   Take 1 g by mouth 2 (two) times daily.          . hydrochlorothiazide (MICROZIDE) 12.5 MG capsule   Oral   Take 12.5 mg by mouth every morning.          . loratadine (CLARITIN) 10 MG tablet   Oral   Take 10 mg by mouth every morning.         . Multiple Vitamin (MULTIVITAMIN)  tablet   Oral   Take 1 tablet by mouth every morning.          [name] oxyCODONE-acetaminophen (PERCOCET) 5-325 MG per tablet   Oral   Take 1 tablet by mouth every 4 (four) hours as needed for pain.   20 tablet   0   . predniSONE (DELTASONE) 10 MG tablet   Oral   Take 10-60 mg by mouth daily. Taper down 6 tablets once daily then 5 ,4,3,2 ,1 tablets until finished         . promethazine (PHENERGAN) 25 MG tablet   Oral   Take 25 mg by mouth every 8 (eight) hours as needed. For nausea         . oxyCODONE-acetaminophen (PERCOCET) 5-325 MG per tablet   Oral   Take 2 tablets by mouth every 4 (four) hours as needed for pain.   20 tablet   0   . promethazine (PHENERGAN) 25 MG tablet   Oral   Take 1 tablet (25 mg total) by mouth every 6 (six) hours as needed for nausea.   15 tablet   0     BP 113/63  Pulse 89  Temp(Src) 98.5 F (36.9 C) (Oral)  Resp 20  SpO2 98%  Physical Exam  Nursing note and vitals reviewed. Constitutional: She is oriented to person, place, and time. She appears well-developed and well-nourished.  HENT:  Head: Normocephalic and atraumatic.  Eyes: Conjunctivae and EOM are normal. Pupils are equal, round, and reactive to light.  Neck: Normal range of motion. Neck supple.   Cardiovascular: Normal rate, regular rhythm and normal heart sounds.   Pulmonary/Chest: Effort normal and breath sounds normal.  Abdominal: Soft. Bowel sounds are normal.  Genitourinary:  Minimal right flank tenderness  Musculoskeletal: Normal range of motion.  Neurological: She is alert and oriented to person, place, and time.  Skin: Skin is warm and dry.  Psychiatric: She has a normal mood and affect.    ED Course  Procedures (including critical care time)  Labs Reviewed  COMPREHENSIVE METABOLIC PANEL - Abnormal; Notable for the following:    Glucose, Bld 117 (*)    Albumin 3.4 (*)    All other components within normal limits  CBC WITH DIFFERENTIAL - Abnormal; Notable for the following:    RBC 3.77 (*)    Neutrophils Relative 92 (*)    Neutro Abs 9.0 (*)    Lymphocytes Relative 4 (*)    Lymphs Abs 0.4 (*)    All other components within normal limits  URINALYSIS, ROUTINE W REFLEX MICROSCOPIC - Abnormal; Notable for the following:    Hgb urine dipstick TRACE (*)    All other components within normal limits  LIPASE, BLOOD  URINE MICROSCOPIC-ADD ON   Ct Abdomen Pelvis Wo Contrast  06/30/2012  *RADIOLOGY REPORT*  Clinical Data: Right low back pain for several days, hematuria  CT ABDOMEN AND PELVIS WITHOUT CONTRAST  Technique:  Multidetector CT imaging of the abdomen and pelvis was performed following the standard protocol without intravenous contrast.  Comparison: CT abdomen pelvis of 05/19/2012  Findings: The lung bases are clear.  Median sternotomy sutures are noted.  There is a small hiatal hernia present.  The liver is unremarkable in the unenhanced state.  Surgical clips are present from prior cholecystectomy.  The pancreas is normal in size and the pancreatic duct is not dilated.  The adrenal glands and spleen are unremarkable.  The stomach is not well distended.  No renal calculi  are seen and there is no evidence of hydronephrosis.  The abdominal aorta is normal in caliber.   The ureters are normal in caliber. No distal ureteral calculi are seen.   The urinary bladder is unremarkable although not optimally distended. There is a slight lumbar scoliosis convex to the left with degenerative disc disease at L2-3, L4-5, and L5 S1 levels. The uterus is normal in size.  No adnexal lesion is seen.  No fluid is noted within the pelvis.  There are a few scattered rectosigmoid colonic diverticula present.  The terminal ileum is unremarkable. The appendix has previously been resected.  No groin adenopathy is seen.  IMPRESSION:  1.  No explanation for the patient's right-sided pain or hematuria is seen.  No renal or ureteral calculi are noted. 2.  The urinary bladder is not optimally distended but no abnormality is evident. 3.  Small hiatal hernia. 4.  Scattered rectosigmoid colonic diverticula.   Original Report Authenticated By: Dwyane Dee, M.D.      1. Right flank pain       MDM  Patient is in minimal distress. No acute abdomen. Color is good. She normally is hypotensive.  Does not appear to be in extreme pain whatsoever.  Discharge meds include Percocet #20 and Phenergan 25 mg #15        Donnetta Hutching, MD 07/01/12 1237

## 2012-07-06 DIAGNOSIS — R3129 Other microscopic hematuria: Secondary | ICD-10-CM | POA: Diagnosis not present

## 2012-07-06 DIAGNOSIS — M545 Low back pain, unspecified: Secondary | ICD-10-CM | POA: Diagnosis not present

## 2012-07-06 DIAGNOSIS — M546 Pain in thoracic spine: Secondary | ICD-10-CM | POA: Diagnosis not present

## 2012-07-09 DIAGNOSIS — M545 Low back pain, unspecified: Secondary | ICD-10-CM | POA: Diagnosis not present

## 2012-07-10 DIAGNOSIS — M8448XA Pathological fracture, other site, initial encounter for fracture: Secondary | ICD-10-CM | POA: Diagnosis not present

## 2012-07-11 ENCOUNTER — Other Ambulatory Visit: Payer: Self-pay | Admitting: Neurosurgery

## 2012-07-11 DIAGNOSIS — M8448XA Pathological fracture, other site, initial encounter for fracture: Secondary | ICD-10-CM

## 2012-07-12 ENCOUNTER — Encounter: Payer: Self-pay | Admitting: Gynecology

## 2012-07-12 ENCOUNTER — Ambulatory Visit (INDEPENDENT_AMBULATORY_CARE_PROVIDER_SITE_OTHER): Payer: Medicare Other | Admitting: Gynecology

## 2012-07-12 VITALS — BP 128/84 | Ht 63.5 in | Wt 154.0 lb

## 2012-07-12 DIAGNOSIS — N951 Menopausal and female climacteric states: Secondary | ICD-10-CM | POA: Insufficient documentation

## 2012-07-12 DIAGNOSIS — Z01419 Encounter for gynecological examination (general) (routine) without abnormal findings: Secondary | ICD-10-CM

## 2012-07-12 DIAGNOSIS — IMO0001 Reserved for inherently not codable concepts without codable children: Secondary | ICD-10-CM

## 2012-07-12 DIAGNOSIS — M8448XD Pathological fracture, other site, subsequent encounter for fracture with routine healing: Secondary | ICD-10-CM

## 2012-07-12 DIAGNOSIS — L409 Psoriasis, unspecified: Secondary | ICD-10-CM | POA: Insufficient documentation

## 2012-07-12 DIAGNOSIS — L408 Other psoriasis: Secondary | ICD-10-CM

## 2012-07-12 DIAGNOSIS — M4850XA Collapsed vertebra, not elsewhere classified, site unspecified, initial encounter for fracture: Secondary | ICD-10-CM | POA: Insufficient documentation

## 2012-07-12 MED ORDER — BETAMETHASONE DIPROPIONATE 0.05 % EX CREA: TOPICAL_CREAM | Freq: Two times a day (BID) | CUTANEOUS | Status: DC

## 2012-07-12 NOTE — Patient Instructions (Addendum)
Osteoporosis Throughout your life, your body breaks down old bone and replaces it with new bone. As you get older, your body does not replace bone as quickly as it breaks it down. By the age of 30 years, most people begin to gradually lose bone because of the imbalance between bone loss and replacement. Some people lose more bone than others. Bone loss beyond a specified normal degree is considered osteoporosis.  Osteoporosis affects the strength and durability of your bones. The inside of the ends of your bones and your flat bones, like the bones of your pelvis, look like honeycomb, filled with tiny open spaces. As bone loss occurs, your bones become less dense. This means that the open spaces inside your bones become bigger and the walls between these spaces become thinner. This makes your bones weaker. Bones of a person with osteoporosis can become so weak that they can break (fracture) during minor accidents, such as a simple fall. CAUSES  The following factors have been associated with the development of osteoporosis:  Smoking.  Drinking more than 2 alcoholic drinks several days per week.  Long-term use of certain medicines:  Corticosteroids.  Chemotherapy medicines.  Thyroid medicines.  Antiepileptic medicines.  Gonadal hormone suppression medicine.  Immunosuppression medicine.  Being underweight.  Lack of physical activity.  Lack of exposure to the sun. This can lead to vitamin D deficiency.  Certain medical conditions:  Certain inflammatory bowel diseases, such as Crohn's disease and ulcerative colitis.  Diabetes.  Hyperthyroidism.  Hyperparathyroidism. RISK FACTORS Anyone can develop osteoporosis. However, the following factors can increase your risk of developing osteoporosis:  Gender Women are at higher risk than men.  Age Being older than 50 years increases your risk.  Ethnicity White and Asian people have an increased risk.  Weight Being extremely  underweight can increase your risk of osteoporosis.  Family history of osteoporosis Having a family member who has developed osteoporosis can increase your risk. SYMPTOMS  Usually, people with osteoporosis have no symptoms.  DIAGNOSIS  Signs during a physical exam that may prompt your caregiver to suspect osteoporosis include:  Decreased height. This is usually caused by the compression of the bones that form your spine (vertebrae) because they have weakened and become fractured.  A curving or rounding of the upper back (kyphosis). To confirm signs of osteoporosis, your caregiver may request a procedure that uses 2 low-dose X-ray beams with different levels of energy to measure your bone mineral density (dual-energy X-ray absorptiometry [DXA]). Also, your caregiver may check your level of vitamin D. TREATMENT  The goal of osteoporosis treatment is to strengthen bones in order to decrease the risk of bone fractures. There are different types of medicines available to help achieve this goal. Some of these medicines work by slowing the processes of bone loss. Some medicines work by increasing bone density. Treatment also involves making sure that your levels of calcium and vitamin D are adequate. PREVENTION  There are things you can do to help prevent osteoporosis. Adequate intake of calcium and vitamin D can help you achieve optimal bone mineral density. Regular exercise can also help, especially resistance and high-impact activities. If you smoke, quitting smoking is an important part of osteoporosis prevention. MAKE SURE YOU:  Understand these instructions.  Will watch your condition.  Will get help right away if you are not doing well or get worse. Document Released: 02/03/2005 Document Revised: 07/19/2011 Document Reviewed: 04/10/2011 [facility] Patient Information 2013 [place], [place].  Teriparatide injection What is this  medicine? TERIPARATIDE (terr ih PAR a tyd) increases bone mass and  strength. It helps make healthy bone and to slow bone loss. This medicine is used to prevent bone fractures. This medicine may be used for other purposes; ask your health care Chantella Creech or pharmacist if you have questions. What should I tell my health care Daveon Arpino before I take this medicine? They need to know if you have any of these conditions: -bone disease other than osteoporosis -heart, kidney or liver disease -history of cancer in the bone -kidney stone -Paget's disease -parathyroid disease -receiving radiation therapy -an unusual or allergic reaction to teriparatide, other medicines, foods, dyes, or preservatives -pregnant or trying to get pregnant -breast-feeding How should I use this medicine? This medicine comes in an injection pen device. This pen injects the medicine under your skin. Follow the directions on the prescription label. You will be taught how to use this medicine. Take your medicine at regular intervals. Do not take your medicine more often than directed. Do not use this medicine if it has solid particles in it, or if it is cloudy or colored. It should be clear and colorless. Be sure that you are using your pen device correctly.  A special MedGuide will be given to you by the pharmacist with each prescription and refill. Be sure to read this information carefully each time. Talk to your pediatrician regarding the use of this medicine in children. Special care may be needed. Overdosage: If you think you have taken too much of this medicine contact a poison control center or emergency room at once. NOTE: This medicine is only for you. Do not share this medicine with others. What if I miss a dose? If you miss a dose, take it as soon as you can. If it is almost time for your next dose, take only that dose. Do not take double or extra doses. What may interact with this medicine? -digoxin -other medicines to strengthen bone This list may not describe all possible  interactions. Give your health care Oddie Bottger a list of all the medicines, herbs, non-prescription drugs, or dietary supplements you use. Also tell them if you smoke, drink alcohol, or use illegal drugs. Some items may interact with your medicine. What should I watch for while using this medicine? Visit your doctor or health care professional for regular checks on your progress. Your doctor may order blood tests and other tests to see how you are doing. You should make sure you get enough calcium and vitamin D while you are taking this medicine, unless your doctor tells you not to. Discuss the foods you eat and the vitamins you take with your health care professional. Bonita Quin may get drowsy or dizzy. Do not drive, use machinery, or do anything that needs mental alertness until you know how this medicine affects you. Do not stand or sit up quickly, especially if you are an older patient. This reduces the risk of dizzy or fainting spells. What side effects may I notice from receiving this medicine? Side effects that you should report to your doctor or health care professional as soon as possible: -allergic reactions like skin rash, itching or hives, swelling of the face, lips, or tongue -chronic constipation -high blood pressure -irregular heartbeat, chest pain -nausea, vomiting -unusually weak or tired Side effects that usually do not require medical attention (report to your doctor or health care professional if they continue or are bothersome): -bone pain -cough, runny nose -headache -leg cramps -muscle spasms in the  back or legs -pain, redness, irritation or swelling at the injection site -stomach upset -trouble sleeping This list may not describe all possible side effects. Call your doctor for medical advice about side effects. You may report side effects to FDA at 1-800-FDA-1088. Where should I keep my medicine? Keep out of the reach of children. Store in a refrigerator between 2 and 8  degrees C (36 and 46 degrees F). Do not freeze. Recap the pen injector when not in use to protect it from light and damage. Use quickly after taking out of the refrigerator and return to refrigerator right after using. Throw away any unused medicine 28 days after the first injection from the device. Do not use after the expiration date printed on the pen and pen packaging. NOTE: This sheet is a summary. It may not cover all possible information. If you have questions about this medicine, talk to your doctor, pharmacist, or health care Veleda Mun.  2013, [employer]. (04/23/2008 5:45:37 PM)  Psoriasis Psoriasis is a common, long-lasting (chronic) inflammation of the skin. It affects both men and women equally, of all ages and all races. Psoriasis cannot be passed from person to person (not contagious). Psoriasis varies from mild to very severe. When severe, it can greatly affect your quality of life. Psoriasis is an inflammatory disorder affecting the skin as well as other organs including the joints (causing an arthritis). With psoriasis, the skin sheds its top layer of cells more rapidly than it does in someone without psoriasis. CAUSES  The cause of psoriasis is largely unknown. Genetics, your immune system, and the environment seem to play a role in causing psoriasis. Factors that can make psoriasis worse include:  Damage or trauma to the skin, such as cuts, scrapes, and sunburn. This damage often causes new areas of psoriasis (lesions).  Winter dryness and lack of sunlight.  Medicines such as lithium, beta-blockers, antimalarial drugs, ACE inhibitors, nonsteroidal anti-inflammatory drugs (ibuprofen, aspirin), and terbinafine. Let your caregiver know if you are taking any of these drugs.  Alcohol. Excessive alcohol use should be avoided if you have psoriasis. Drinking large amounts of alcohol can affect:  How well your psoriasis treatment works.  How safe your psoriasis treatment  is.  Smoking. If you smoke, ask your caregiver for help to quit.  Stress.  Bacterial or viral infections.  Arthritis. Arthritis associated with psoriasis (psoriatic arthritis) affects less than 10% of patients with psoriasis. The arthritic intensity does not always match the skin psoriasis intensity. It is important to let your caregiver know if your joints hurt or if they are stiff. SYMPTOMS  The most common form of psoriasis begins with little red bumps that gradually become larger. The bumps begin to form scales that flake off easily. The lower layers of scales stick together. When these scales are scratched or removed, the underlying skin is tender and bleeds easily. These areas then grow in size and may become large. Psoriasis often creates a rash that looks the same on both sides of the body (symmetrical). It often affects the elbows, knees, groin, genitals, arms, legs, scalp, and nails. Affected nails often have pitting, loosen, thicken, crumble, and are difficult to treat.  "Inverse psoriasis"occurs in the armpits, under breasts, in skin folds, and around the groin, buttocks, and genitals.  "Guttate psoriasis" generally occurs in children and young adults following a recent sore throat (strep throat). It begins with many small, red, scaly spots on the skin. It clears spontaneously in weeks or a few  months without treatment. DIAGNOSIS  Psoriasis is diagnosed by physical exam. A tissue sample (biopsy) may also be taken. TREATMENT The treatment of psoriasis depends on your age, health, and living conditions.  Steroid (cortisone) creams, lotions, and ointments may be used. These treatments are associated with thinning of the skin, blood vessels that get larger (dilated), loss of skin pigmentation, and easy bruising. It is important to use these steroids as directed by your caregiver. Only treat the affected areas and not the normal, unaffected skin. People on long-term steroid treatment  should wear a medical alert bracelet. Injections may be used in areas that are difficult to treat.  Scalp treatments are available as shampoos, solutions, sprays, foams, and oils. Avoid scratching the scalp and picking at the scales.  Anthralin medicine works well on areas that are difficult to treat. However, it stains clothes and skin and may cause temporary irritation.  Synthetic vitamin D (calcipotriene)can be used on small areas. It is available by prescription. The forms of synthetic vitamin D available in health food stores do not help with psoriasis.  Coal tarsare available in various strengths for psoriasis that is difficult to treat. They are one of the longest used treatments for difficult to treat psoriasis. However, they are messy to use.  Light therapy (UV therapy) can be carefully and professionally monitored in a dermatologist's office. Careful sunbathing is helpful for many people as directed by your caregiver. The exposure should be just long enough to cause a mild redness (erythema) of your skin. Avoid sunburn as this may make the condition worse. Sunscreen (SPF of 30 or higher) should be used to protect against sunburn. Cataracts, wrinkles, and skin aging are some of the harmful side effects of light therapy.  If creams (topical medicines) fail, there are several other options for systemic or oral medicines your caregiver can suggest. Psoriasis can sometimes be very difficult to treat. It can come and go. It is necessary to follow up with your caregiver regularly if your psoriasis is difficult to treat. Usually, with persistence you can get a good amount of relief. Maintaining consistent care is important. Do not change caregivers just because you do not see immediate results. It may take several trials to find the right combination of treatment for you. PREVENTING FLARE-UPS  Wear gloves while you wash dishes, while cleaning, and when you are outside in the cold.  If you have  radiators, place a bowl of water or damp towel on the radiator. This will help put water back in the air. You can also use a humidifier to keep the air moist. Try to keep the humidity at about 60% in your home.  Apply moisturizer while your skin is still damp from bathing or showering. This traps water in the skin.  Avoid long, hot baths or showers. Keep soap use to a minimum. Soaps dry out the skin and wash away the protective oils. Use a fragrance free, dye free soap.  Drink enough water and fluids to keep your urine clear or pale yellow. Not drinking enough water depletes your skin's water supply.  Turn off the heat at night and keep it low during the day. Cool air is less drying. SEEK MEDICAL CARE IF:  You have increasing pain in the affected areas.  You have uncontrolled bleeding in the affected areas.  You have increasing redness or warmth in the affected areas.  You start to have pain or stiffness in your joints.  You start feeling depressed about your  condition.  You have a fever. Document Released: 04/23/2000 Document Revised: 07/19/2011 Document Reviewed: 10/19/2010 [facility] Patient Information 2013 [place], [place].

## 2012-07-12 NOTE — Progress Notes (Addendum)
[name] 1961/04/03 [id number]   History:    52 y.o.  for annual gyn exam with a complaint of skin rash. She stated that she's waiting for an appointment to see her dermatologist. She also has informed a that's she has a compression fracture of L1 of less than 20% diagnosed recently. She's currently using a back brace and is scheduled for bone density study by the neurosurgeon who has been seeing her. Patient's mother had history of breast cancer in the past. Patient does her own monthly self breast examination and has had a previous tubal sterilization procedure. She had a normal colonoscopy in 2009. Patient with no prior history of abnormal Pap smears. Patient with history of cardiomyopathy (ASD repair as a child) is being followed by her cardiologist a regular basis. Patient is hearing impaired.  Past medical history,surgical history, family history and social history were all reviewed and documented in the EPIC chart.  Gynecologic History No LMP recorded. Patient is postmenopausal. Contraception: tubal ligation Last Pap: 2012. Results were: normal Last mammogram: 2014. Results were: normal  Obstetric History OB History   Grav Para Term Preterm Abortions TAB SAB Ect Mult Living   2 2 2       2      # Outc Date GA Lbr Len/2nd Wgt Sex Del Anes PTL Lv   1 TRM            2 TRM                ROS: A ROS was performed and pertinent positives and negatives are included in the history.  GENERAL: No fevers or chills. HEENT: No change in vision, no earache, sore throat or sinus congestion. NECK: No pain or stiffness. CARDIOVASCULAR: No chest pain or pressure. No palpitations. PULMONARY: No shortness of breath, cough or wheeze. GASTROINTESTINAL: No abdominal pain, nausea, vomiting or diarrhea, melena or bright red blood per rectum. GENITOURINARY: No urinary frequency, urgency, hesitancy or dysuria. MUSCULOSKELETAL: No joint or muscle pain, no back pain, no recent trauma. DERMATOLOGIC: rash upper  and lower extremities. ENDOCRINE: No polyuria, polydipsia, no heat or cold intolerance. No recent change in weight. HEMATOLOGICAL: No anemia or easy bruising or bleeding. NEUROLOGIC: No headache, seizures, numbness, tingling or weakness. PSYCHIATRIC: No depression, no loss of interest in normal activity or change in sleep pattern.     Exam: chaperone present  BP 128/84  Ht 5' 3.5" (1.613 m)  Wt 154 lb (69.854 kg)  BMI 26.85 kg/m2  Body mass index is 26.85 kg/(m^2).  General appearance : Well developed well nourished female. No acute distress HEENT: Neck supple, trachea midline, no carotid bruits, no thyroidmegaly Lungs: Clear to auscultation, no rhonchi or wheezes, or rib retractions  Heart: Regular rate and rhythm, no murmurs or gallops Breast:Examined in sitting and supine position were symmetrical in appearance, no palpable masses or tenderness,  no skin retraction, no nipple inversion, no nipple discharge, no skin discoloration, no axillary or supraclavicular lymphadenopathy Abdomen: no palpable masses or tenderness, no rebound or guarding Extremities:rash upper and lower extremities suspicious for psoriasis  Pelvic:  Bartholin, Urethra, Skene Glands: Within normal limits             Vagina: No gross lesions or discharge  Cervix: No gross lesions or discharge  Uterus  anteverted, normal size, shape and consistency, non-tender and mobile  Adnexa  Without masses or tenderness  Anus and perineum  normal   Rectovaginal  normal sphincter tone without palpated masses or  tenderness             Hemoccult cards provided     Assessment/Plan:  52 y.o. female for annual exam who appears to have psoriasis. She will be prescribed  Diprolene 0.05% to apply 2 times daily for the next 7-10 days. She was reminded to followup with her dermatologist.no Pap smear done today the new guidelines discussed. Her primary physician has been doing her lab work. I've asked her to obtain a copy of the report  from her bone density study. I've given her literature information on Forteo as well as of osteoporosis. By history alone and findings of compression vertebral fracture she is osteoporotic. We discussed importance of calcium and vitamin D for osteoporosis prevention. We'll wait to review the report of her bone density study after it is done since her neurosurgeon hasn't started her on treatment. She was reminded to do her monthly self breast examination. She was also reminded to submit to the office Hemoccult cards for testing.   Ok Edwards MD, 5:05 PM 07/12/2012

## 2012-07-13 DIAGNOSIS — F319 Bipolar disorder, unspecified: Secondary | ICD-10-CM | POA: Diagnosis not present

## 2012-07-13 DIAGNOSIS — F411 Generalized anxiety disorder: Secondary | ICD-10-CM | POA: Diagnosis not present

## 2012-07-17 DIAGNOSIS — F411 Generalized anxiety disorder: Secondary | ICD-10-CM | POA: Diagnosis not present

## 2012-07-17 DIAGNOSIS — F319 Bipolar disorder, unspecified: Secondary | ICD-10-CM | POA: Diagnosis not present

## 2012-07-20 DIAGNOSIS — L259 Unspecified contact dermatitis, unspecified cause: Secondary | ICD-10-CM | POA: Diagnosis not present

## 2012-07-21 ENCOUNTER — Encounter: Payer: Self-pay | Admitting: Obstetrics and Gynecology

## 2012-07-22 ENCOUNTER — Encounter (HOSPITAL_COMMUNITY): Payer: Self-pay

## 2012-07-22 ENCOUNTER — Emergency Department (HOSPITAL_COMMUNITY)
Admission: EM | Admit: 2012-07-22 | Discharge: 2012-07-22 | Disposition: A | Discharge status: Home / Self Care | Payer: Medicare Other | Attending: Emergency Medicine | Admitting: Emergency Medicine

## 2012-07-22 DIAGNOSIS — K449 Diaphragmatic hernia without obstruction or gangrene: Secondary | ICD-10-CM | POA: Insufficient documentation

## 2012-07-22 DIAGNOSIS — M129 Arthropathy, unspecified: Secondary | ICD-10-CM | POA: Diagnosis not present

## 2012-07-22 DIAGNOSIS — Z8719 Personal history of other diseases of the digestive system: Secondary | ICD-10-CM | POA: Insufficient documentation

## 2012-07-22 DIAGNOSIS — I10 Essential (primary) hypertension: Secondary | ICD-10-CM | POA: Diagnosis not present

## 2012-07-22 DIAGNOSIS — F329 Major depressive disorder, single episode, unspecified: Secondary | ICD-10-CM | POA: Insufficient documentation

## 2012-07-22 DIAGNOSIS — Z8739 Personal history of other diseases of the musculoskeletal system and connective tissue: Secondary | ICD-10-CM | POA: Insufficient documentation

## 2012-07-22 DIAGNOSIS — R110 Nausea: Secondary | ICD-10-CM | POA: Insufficient documentation

## 2012-07-22 DIAGNOSIS — F3289 Other specified depressive episodes: Secondary | ICD-10-CM | POA: Insufficient documentation

## 2012-07-22 DIAGNOSIS — Z79899 Other long term (current) drug therapy: Secondary | ICD-10-CM | POA: Insufficient documentation

## 2012-07-22 DIAGNOSIS — Z8679 Personal history of other diseases of the circulatory system: Secondary | ICD-10-CM | POA: Diagnosis not present

## 2012-07-22 DIAGNOSIS — Z7982 Long term (current) use of aspirin: Secondary | ICD-10-CM | POA: Insufficient documentation

## 2012-07-22 DIAGNOSIS — H9190 Unspecified hearing loss, unspecified ear: Secondary | ICD-10-CM | POA: Diagnosis not present

## 2012-07-22 DIAGNOSIS — E860 Dehydration: Secondary | ICD-10-CM

## 2012-07-22 DIAGNOSIS — R112 Nausea with vomiting, unspecified: Secondary | ICD-10-CM | POA: Diagnosis not present

## 2012-07-22 DIAGNOSIS — G43909 Migraine, unspecified, not intractable, without status migrainosus: Secondary | ICD-10-CM | POA: Insufficient documentation

## 2012-07-22 DIAGNOSIS — R079 Chest pain, unspecified: Secondary | ICD-10-CM | POA: Insufficient documentation

## 2012-07-22 HISTORY — DX: Essential (primary) hypertension: I10

## 2012-07-22 LAB — CBC WITH DIFFERENTIAL/PLATELET
Basophils Relative: 1 % (ref 0–1)
Eosinophils Absolute: 0.1 10*3/uL (ref 0.0–0.7)
Eosinophils Relative: 2 % (ref 0–5)
HCT: 33.7 % — ABNORMAL LOW (ref 36.0–46.0)
Hemoglobin: 12.2 g/dL (ref 12.0–15.0)
MCH: 32.9 pg (ref 26.0–34.0)
MCHC: 36.2 g/dL — ABNORMAL HIGH (ref 30.0–36.0)
MCV: 90.8 fL (ref 78.0–100.0)
Monocytes Absolute: 0.5 10*3/uL (ref 0.1–1.0)
Monocytes Relative: 10 % (ref 3–12)
RDW: 14.2 % (ref 11.5–15.5)

## 2012-07-22 LAB — TROPONIN I: Troponin I: <0.3 ng/mL (ref <0.30)

## 2012-07-22 LAB — COMPREHENSIVE METABOLIC PANEL
Albumin: 3.8 g/dL (ref 3.5–5.2)
BUN: 23 mg/dL (ref 6–23)
Calcium: 9.9 mg/dL (ref 8.4–10.5)
Chloride: 95 mEq/L — ABNORMAL LOW (ref 96–112)
Creatinine, Ser: 0.67 mg/dL (ref 0.50–1.10)
Total Bilirubin: 0.3 mg/dL (ref 0.3–1.2)
Total Protein: 7.5 g/dL (ref 6.0–8.3)

## 2012-07-22 LAB — LIPASE, BLOOD: Lipase: 32 U/L (ref 11–59)

## 2012-07-22 MED ORDER — OMEPRAZOLE 20 MG PO CPDR: DELAYED_RELEASE_CAPSULE | ORAL | Status: DC

## 2012-07-22 MED ORDER — DIPHENHYDRAMINE HCL 50 MG/ML IJ SOLN
25.0000 mg | Freq: Once | INTRAMUSCULAR | Status: AC
  Administered 2012-07-22: 25 mg via INTRAVENOUS
  Filled 2012-07-22: qty 1

## 2012-07-22 MED ORDER — ACETAMINOPHEN 325 MG PO TABS
650.0000 mg | ORAL_TABLET | Freq: Once | ORAL | Status: AC
  Administered 2012-07-22: 650 mg via ORAL
  Filled 2012-07-22: qty 2

## 2012-07-22 MED ORDER — SODIUM CHLORIDE 0.9 % IV SOLN
1000.0000 mL | INTRAVENOUS | Status: DC
Start: 2012-07-22 — End: 2012-07-22
  Administered 2012-07-22: 1000 mL via INTRAVENOUS

## 2012-07-22 MED ORDER — METOCLOPRAMIDE HCL 5 MG/ML IJ SOLN
10.0000 mg | Freq: Once | INTRAMUSCULAR | Status: AC
  Administered 2012-07-22: 10 mg via INTRAVENOUS
  Filled 2012-07-22: qty 2

## 2012-07-22 MED ORDER — ONDANSETRON 8 MG PO TBDP: 8.0000 mg | ORAL_TABLET | Freq: Three times a day (TID) | ORAL | Status: DC | PRN

## 2012-07-22 MED ORDER — SODIUM CHLORIDE 0.9 % IV SOLN
1000.0000 mL | Freq: Once | INTRAVENOUS | Status: AC
  Administered 2012-07-22: 1000 mL via INTRAVENOUS

## 2012-07-22 NOTE — ED Notes (Signed)
Ginger ale given

## 2012-07-22 NOTE — ED Notes (Signed)
Pt. Ate 100% of dinner. 

## 2012-07-22 NOTE — ED Provider Notes (Signed)
History     CSN: [id number]  Arrival date & time 07/22/12  1158   First MD Initiated Contact with Patient 07/22/12 1218      Chief Complaint  Patient presents with  . Chest Pain    (Consider location/radiation/quality/duration/timing/severity/associated sxs/prior treatment) HPI  Patient is hearing impaired. She reports yesterday evening she had nausea all night long. At 3 AM she had vomiting. She states she continued to have nausea this morning. She ate a hash brown from [employer] and drink some Gatorade and about 40 minutes later which was about 10:30 in the morning she started getting some central chest pain that she described as burning and like an elephant sitting on her chest that radiated into her back (different from her recent fracture in her back). She states it came and went 3 times and lasted about 10 minutes each time. She states she took sometimes. She reports she called EMS and her pain was improving when they got there. They gave her nitroglycerin which made her become hypotensive and gave her 324 mg of aspirin. She relates her pain is gone at this time. She denies any diarrhea, shortness of breath, numbness or tingling in her arms or legs. She states she did have some mild sweatiness with the pain.  Patient reports she has been having back pain for approximately a month and was recently diagnosed with a "compound fracture" in her spine. She denies any injury and states she was told she may have osteoporosis. She was seen by Dr. Phoebe Perch, neurosurgeon. She is scheduled to have a bone density test next week. She states at this point they are not discussing surgery. She states she did take hydrocodone last night which she's not sure if maybe that made her have the upset stomach.  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 Sigmund [facility]  Past Medical History   Diagnosis Date  . Depression   . Migraine   . Fibromyalgia   . Hearing impairment   . Arthritis   . Cardiomyopathy   . Pulmonary embolism   . Gastritis   . Hypertension     Past Surgical History  Procedure Laterality Date  . Appendectomy    . Inner ear surgery      TUBES  . Tonsillectomy and adenoidectomy    . Tubal ligation    . Cardiac surgery  January 2007    mitral valve repair  . Cardiac surgery  1973    atrial septal defect  . Laparoscopic cholecystectomy  2012    Family History  Problem Relation Age of Onset  . Cancer Mother   . Breast cancer Mother   . Depression Sister   . Heart disease Father   . Hypertension Father     History  Substance Use Topics  . Smoking status: Never Smoker   . Smokeless tobacco: Not on file  . Alcohol Use: No  Lives at home Lives with children age 42 and 91 On disability    OB History   Grav Para Term Preterm Abortions TAB SAB Ect Mult Living   2 2 2       2       Review of Systems  All other systems reviewed and are negative.    Allergies  Depakote and Valium  Home Medications   Current Outpatient Rx  Name  Route  Sig  Dispense  Refill  . ARIPiprazole (ABILIFY) 10 MG tablet   Oral   Take 10 mg by mouth every morning.         [name] aspirin EC 81 MG tablet   Oral   Take 81 mg by mouth every morning.         . betamethasone dipropionate (DIPROLENE) 0.05 % cream   Topical   Apply topically 2 (two) times daily.   30 g   2   . BuPROPion HCl ER, XL, (FORFIVO XL) 450 MG TB24   Oral   Take 450 mg by mouth every morning.         . Calcium Carbonate-Vitamin D (CALCIUM 600 + D PO)   Oral   Take 1 tablet by mouth 2 (two) times daily.         . carvedilol (COREG) 25 MG tablet   Oral   Take 25 mg by mouth 2 (two) times daily with a meal.         . desvenlafaxine (PRISTIQ) 100 MG 24 hr tablet   Oral   Take 100 mg by mouth every morning.         . fish oil-omega-3 fatty acids 1000 MG capsule    Oral   Take 1 g by mouth 2 (two) times daily.          . Flaxseed, Linseed, (FLAXSEED OIL) 1000 MG CAPS   Oral   Take 1,000 mg by mouth 2 (two) times daily.         . hydrochlorothiazide (MICROZIDE) 12.5 MG capsule   Oral   Take 12.5 mg by mouth every morning.          [name] HYDROcodone-acetaminophen (NORCO/VICODIN) 5-325 MG per tablet   Oral   Take 1 tablet by mouth every 6 (six) hours as needed for pain.         [name] loratadine (CLARITIN) 10 MG tablet   Oral   Take 10 mg by mouth daily as needed for allergies.          . Multiple Vitamin (MULTIVITAMIN) tablet   Oral   Take 1 tablet by mouth every morning.          . ranitidine (ZANTAC) 75 MG tablet   Oral   Take 75 mg by mouth daily as needed for heartburn.           BP 131/84  Pulse 109  Temp(Src) 98.6 F (37 C) (Oral)  Resp 22  SpO2 98%  Vital signs normal    Physical Exam  Nursing note and vitals reviewed. Constitutional: She is oriented to person, place, and time. She appears well-developed and well-nourished.  Non-toxic appearance. She does not appear ill. No distress.  HENT:  Head: Normocephalic and atraumatic.  Right Ear: External ear normal.  Left Ear: External ear normal.  Nose: Nose normal. No mucosal edema or rhinorrhea.  Mouth/Throat: Oropharynx is clear and moist and mucous membranes are normal. No dental abscesses or edematous.  Wearing hearing aides  Eyes: Conjunctivae and EOM are normal. Pupils are equal, round, and reactive to light.  Neck: Normal range of motion and full passive range of motion without pain. Neck supple.  Cardiovascular: Normal rate, regular rhythm and normal heart sounds.  Exam reveals no gallop and no friction rub.   No murmur heard. Pulmonary/Chest: Effort normal and breath sounds normal. No respiratory distress. She has no wheezes. She has no rhonchi. She has no rales. She exhibits no  tenderness and no crepitus.  Abdominal: Soft. Normal appearance and bowel sounds  are normal. She exhibits no distension. There is no tenderness. There is no rebound and no guarding.  Musculoskeletal: Normal range of motion. She exhibits no edema and no tenderness.  Moves all extremities well.   Neurological: She is alert and oriented to person, place, and time. She has normal strength. No cranial nerve deficit.  Skin: Skin is warm, dry and intact. No rash noted. No erythema. No pallor.  Psychiatric: She has a normal mood and affect. Her speech is normal and behavior is normal. Her mood appears not anxious.    ED Course  Procedures (including critical care time)  Medications  0.9 %  sodium chloride infusion (1,000 mLs Intravenous New Bag/Given 07/22/12 1328)    Followed by  0.9 %  sodium chloride infusion (0 mLs Intravenous Stopped 07/22/12 1500)  metoCLOPramide (REGLAN) injection 10 mg (10 mg Intravenous Given 07/22/12 1330)  diphenhydrAMINE (BENADRYL) injection 25 mg (25 mg Intravenous Given 07/22/12 1332)  acetaminophen (TYLENOL) tablet 650 mg (650 mg Oral Given 07/22/12 1458)   Patient complained of headache. She was given IV Reglan and Benadryl which should also treat with her nausea. Her headache improved but she still had some headache. She was given Tylenol in case this was a nitroglycerin headache.  Patient was able to drink fluids and at the end of her ED visit she was eating without nausea or vomiting.  Patient states she knows she has a hiatal hernia, I feel her pain today is most likely from her hiatal hernia. We discussed treatment of it.  Results for orders placed during the hospital encounter of 07/22/12  CBC WITH DIFFERENTIAL      Result Value Range   WBC 4.8  4.0 - 10.5 K/uL   RBC 3.71 (*) 3.87 - 5.11 MIL/uL   Hemoglobin 12.2  12.0 - 15.0 g/dL   HCT 16.1 (*) 09.6 - 04.5 %   MCV 90.8  78.0 - 100.0 fL   MCH 32.9  26.0 - 34.0 pg   MCHC 36.2 (*) 30.0 - 36.0 g/dL   RDW 40.9  81.1 - 91.4 %   Platelets 212  150 - 400 K/uL   Neutrophils Relative 74  43 -  77 %   Neutro Abs 3.5  1.7 - 7.7 K/uL   Lymphocytes Relative 14  12 - 46 %   Lymphs Abs 0.7  0.7 - 4.0 K/uL   Monocytes Relative 10  3 - 12 %   Monocytes Absolute 0.5  0.1 - 1.0 K/uL   Eosinophils Relative 2  0 - 5 %   Eosinophils Absolute 0.1  0.0 - 0.7 K/uL   Basophils Relative 1  0 - 1 %   Basophils Absolute 0.0  0.0 - 0.1 K/uL  COMPREHENSIVE METABOLIC PANEL      Result Value Range   Sodium 132 (*) 135 - 145 mEq/L   Potassium 4.6  3.5 - 5.1 mEq/L   Chloride 95 (*) 96 - 112 mEq/L   CO2 24  19 - 32 mEq/L   Glucose, Bld 108 (*) 70 - 99 mg/dL   BUN 23  6 - 23 mg/dL   Creatinine, Ser 7.82  0.50 - 1.10 mg/dL   Calcium 9.9  8.4 - 95.6 mg/dL   Total Protein 7.5  6.0 - 8.3 g/dL   Albumin 3.8  3.5 - 5.2 g/dL   AST 35  0 - 37 U/L   ALT 15  0 - 35 U/L   Alkaline Phosphatase 127 (*) 39 - 117 U/L   Total Bilirubin 0.3  0.3 - 1.2 mg/dL   GFR calc non Af Amer >90  >90 mL/min   GFR calc Af Amer >90  >90 mL/min  LIPASE, BLOOD      Result Value Range   Lipase 32  11 - 59 U/L  TROPONIN I      Result Value Range   Troponin I <0.30  <0.30 ng/mL   Laboratory interpretation all normal except mild hyponatremia      Ct Abdomen Pelvis Wo Contrast  06/30/2012  IMPRESSION:  1.  No explanation for the patient's right-sided pain or hematuria is seen.  No renal or ureteral calculi are noted. 2.  The urinary bladder is not optimally distended but no abnormality is evident. 3.  Small hiatal hernia. 4.  Scattered rectosigmoid colonic diverticula.   Original Report Authenticated By: Dwyane Dee, M.D.    Dg Lumbar Spine Complete  06/29/2012  .  IMPRESSION:  1.  Interval mild superior endplate compression deformity at L1. 2.  No other acute osseous findings.  Scoliosis and endplate degenerative changes are stable.   Original Report Authenticated By: Carey Bullocks, M.D.          Date: 07/22/2012  Rate: 107  Rhythm: sinus tachycardia  QRS Axis: normal  Intervals: QT prolonged  ST/T Wave  abnormalities: normal  Conduction Disutrbances:none  Narrative Interpretation:   Old EKG Reviewed: unchanged from 05/19/2012 HR was 98    1. Chest pain   2. Hiatal hernia   3. Nausea   4. Dehydration    New Prescriptions   OMEPRAZOLE (PRILOSEC) 20 MG CAPSULE    Take 1 po BID x 2 weeks then once a day   ONDANSETRON (ZOFRAN ODT) 8 MG DISINTEGRATING TABLET    Take 1 tablet (8 mg total) by mouth every 8 (eight) hours as needed for nausea.    Plan discharge  Devoria Albe, MD, FACEP    MDM          Ward Givens, MD 07/22/12 [phone]

## 2012-07-22 NOTE — ED Notes (Signed)
Pt.  Developed chest pain 1 hour ago.  Center of chest radiated into her back.  Described as elephant on her chest.    Chest pain is a 0 out of 10 present Nitro given en route.  Pt. Became hypotensive.  Fluids started  Received 324mg  of Aspirin. She is stable , pt. Has headache after receiving Nitro  ,  She is burping and having gas and heartburn,.

## 2012-07-22 NOTE — ED Notes (Signed)
Pt. oob to the bathroom gait steady denies any chest pain or sob.

## 2012-07-25 LAB — POCT I-STAT TROPONIN I: Troponin i, poc: 0.01 ng/mL (ref 0.00–0.08)

## 2012-07-26 ENCOUNTER — Ambulatory Visit
Admission: RE | Admit: 2012-07-26 | Discharge: 2012-07-26 | Disposition: A | Discharge status: Home / Self Care | Payer: Medicare Other | Source: Ambulatory Visit | Attending: Neurosurgery | Admitting: Neurosurgery

## 2012-07-26 DIAGNOSIS — M899 Disorder of bone, unspecified: Secondary | ICD-10-CM | POA: Diagnosis not present

## 2012-07-26 DIAGNOSIS — M8448XA Pathological fracture, other site, initial encounter for fracture: Secondary | ICD-10-CM

## 2012-07-26 DIAGNOSIS — M949 Disorder of cartilage, unspecified: Secondary | ICD-10-CM | POA: Diagnosis not present

## 2012-07-28 ENCOUNTER — Encounter: Payer: Self-pay | Admitting: Gynecology

## 2012-08-01 ENCOUNTER — Ambulatory Visit (INDEPENDENT_AMBULATORY_CARE_PROVIDER_SITE_OTHER): Payer: Medicare Other | Admitting: Gynecology

## 2012-08-01 ENCOUNTER — Encounter: Payer: Self-pay | Admitting: Gynecology

## 2012-08-01 ENCOUNTER — Other Ambulatory Visit: Payer: Self-pay | Admitting: Anesthesiology

## 2012-08-01 ENCOUNTER — Ambulatory Visit (INDEPENDENT_AMBULATORY_CARE_PROVIDER_SITE_OTHER): Payer: Medicare Other | Admitting: Physician Assistant

## 2012-08-01 VITALS — BP 120/78

## 2012-08-01 DIAGNOSIS — M949 Disorder of cartilage, unspecified: Secondary | ICD-10-CM

## 2012-08-01 DIAGNOSIS — F331 Major depressive disorder, recurrent, moderate: Secondary | ICD-10-CM

## 2012-08-01 DIAGNOSIS — M899 Disorder of bone, unspecified: Secondary | ICD-10-CM | POA: Diagnosis not present

## 2012-08-01 DIAGNOSIS — Z1211 Encounter for screening for malignant neoplasm of colon: Secondary | ICD-10-CM

## 2012-08-01 DIAGNOSIS — F411 Generalized anxiety disorder: Secondary | ICD-10-CM | POA: Diagnosis not present

## 2012-08-01 DIAGNOSIS — F332 Major depressive disorder, recurrent severe without psychotic features: Secondary | ICD-10-CM

## 2012-08-01 DIAGNOSIS — S32009A Unspecified fracture of unspecified lumbar vertebra, initial encounter for closed fracture: Secondary | ICD-10-CM | POA: Diagnosis not present

## 2012-08-01 DIAGNOSIS — S32019A Unspecified fracture of first lumbar vertebra, initial encounter for closed fracture: Secondary | ICD-10-CM | POA: Insufficient documentation

## 2012-08-01 DIAGNOSIS — E559 Vitamin D deficiency, unspecified: Secondary | ICD-10-CM | POA: Diagnosis not present

## 2012-08-01 MED ORDER — DESVENLAFAXINE SUCCINATE ER 100 MG PO TB24: 100.0000 mg | ORAL_TABLET | Freq: Every morning | ORAL | Status: DC

## 2012-08-01 MED ORDER — ARIPIPRAZOLE 10 MG PO TABS: 10.0000 mg | ORAL_TABLET | Freq: Every morning | ORAL | Status: DC

## 2012-08-01 MED ORDER — BUPROPION HCL ER (XL) 450 MG PO TB24: 450.0000 mg | ORAL_TABLET | Freq: Every morning | ORAL | Status: DC

## 2012-08-01 NOTE — Progress Notes (Addendum)
Patient is a 52 year old who was seen for her annual gynecological examination on 08/01/2012. She had brought to my attention that one day she woke up with severe low back pain without any recent injury or trauma. She had gone to an urgent care and eventually to a urologist because they found some blood in her urine and they did a CT scan which no abnormalities were noted with the exception of a suspected lumbar vertebral fracture. She returned to her primary physician who did an x-ray of her back and subsequently referred  her to the spine specialist Dr. Colon Branch who placed her on a back brace and gave her pain  Medication and ordered her bone density study along with a followup x-ray of her back in the next several weeks before he sees her again.  I reviewed the recent lumbar spine report and the following was documented by the radiologist:  There are five lumbar type vertebral bodies. The  alignment is stable with a convex left scoliosis. There is a new  superior endplate compression fracture at L1 with less than 20%  loss of vertebral body height. No osseous retropulsion or widening  of the interpedicular distance is identified. No other acute  osseous findings are identified. The advanced degenerative disc  disease and endplate sclerosis at L2-L3 are stable. Lesser  endplate degenerative changes at L4-L5 and fragmentation of the  anterior-superior corner of the L4 vertebral body are unchanged.  Patient subsequently had a bone density study on 07/26/2012 with the following result:  AP spine T score of -1.3 Right femoral neck T score of -2.1 A Frax analysis was not done because of her recently found vertebral fracture  Calcium level March 15 9.9 normal range  I discussed the above findings with [name] and I would recommend that we check her vitamin D level and PTH level today. I will send a copy of this note to Dr. Colon Branch. I had discussed with the patient and gave her literature  information on Forteo (Teripatatide) which would be a good bone building anabolic hormone that she could administer herself daily for 2 years because by definition she is osteoporotic (world health organization). The risks benefits and pros and cons of this medication were discussed. We'll wait to hear from her neurosurgeon as to his recommendation or the possibility of vertebroplasty.

## 2012-08-01 NOTE — Patient Instructions (Addendum)
Osteoporosis Throughout your life, your body breaks down old bone and replaces it with new bone. As you get older, your body does not replace bone as quickly as it breaks it down. By the age of 30 years, most people begin to gradually lose bone because of the imbalance between bone loss and replacement. Some people lose more bone than others. Bone loss beyond a specified normal degree is considered osteoporosis.  Osteoporosis affects the strength and durability of your bones. The inside of the ends of your bones and your flat bones, like the bones of your pelvis, look like honeycomb, filled with tiny open spaces. As bone loss occurs, your bones become less dense. This means that the open spaces inside your bones become bigger and the walls between these spaces become thinner. This makes your bones weaker. Bones of a person with osteoporosis can become so weak that they can break (fracture) during minor accidents, such as a simple fall. CAUSES  The following factors have been associated with the development of osteoporosis:  Smoking.  Drinking more than 2 alcoholic drinks several days per week.  Long-term use of certain medicines:  Corticosteroids.  Chemotherapy medicines.  Thyroid medicines.  Antiepileptic medicines.  Gonadal hormone suppression medicine.  Immunosuppression medicine.  Being underweight.  Lack of physical activity.  Lack of exposure to the sun. This can lead to vitamin D deficiency.  Certain medical conditions:  Certain inflammatory bowel diseases, such as Crohn's disease and ulcerative colitis.  Diabetes.  Hyperthyroidism.  Hyperparathyroidism. RISK FACTORS Anyone can develop osteoporosis. However, the following factors can increase your risk of developing osteoporosis:  Gender Women are at higher risk than men.  Age Being older than 50 years increases your risk.  Ethnicity White and Asian people have an increased risk.  Weight Being extremely  underweight can increase your risk of osteoporosis.  Family history of osteoporosis Having a family member who has developed osteoporosis can increase your risk. SYMPTOMS  Usually, people with osteoporosis have no symptoms.  DIAGNOSIS  Signs during a physical exam that may prompt your caregiver to suspect osteoporosis include:  Decreased height. This is usually caused by the compression of the bones that form your spine (vertebrae) because they have weakened and become fractured.  A curving or rounding of the upper back (kyphosis). To confirm signs of osteoporosis, your caregiver may request a procedure that uses 2 low-dose X-ray beams with different levels of energy to measure your bone mineral density (dual-energy X-ray absorptiometry [DXA]). Also, your caregiver may check your level of vitamin D. TREATMENT  The goal of osteoporosis treatment is to strengthen bones in order to decrease the risk of bone fractures. There are different types of medicines available to help achieve this goal. Some of these medicines work by slowing the processes of bone loss. Some medicines work by increasing bone density. Treatment also involves making sure that your levels of calcium and vitamin D are adequate. PREVENTION  There are things you can do to help prevent osteoporosis. Adequate intake of calcium and vitamin D can help you achieve optimal bone mineral density. Regular exercise can also help, especially resistance and high-impact activities. If you smoke, quitting smoking is an important part of osteoporosis prevention. MAKE SURE YOU:  Understand these instructions.  Will watch your condition.  Will get help right away if you are not doing well or get worse. Document Released: 02/03/2005 Document Revised: 07/19/2011 Document Reviewed: 04/10/2011 [facility] Patient Information 2013 [place], [place].  Teriparatide injection What is this  medicine? TERIPARATIDE (terr ih PAR a tyd) increases bone mass and  strength. It helps make healthy bone and to slow bone loss. This medicine is used to prevent bone fractures. This medicine may be used for other purposes; ask your health care provider or pharmacist if you have questions. What should I tell my health care provider before I take this medicine? They need to know if you have any of these conditions: -bone disease other than osteoporosis -heart, kidney or liver disease -history of cancer in the bone -kidney stone -Paget's disease -parathyroid disease -receiving radiation therapy -an unusual or allergic reaction to teriparatide, other medicines, foods, dyes, or preservatives -pregnant or trying to get pregnant -breast-feeding How should I use this medicine? This medicine comes in an injection pen device. This pen injects the medicine under your skin. Follow the directions on the prescription label. You will be taught how to use this medicine. Take your medicine at regular intervals. Do not take your medicine more often than directed. Do not use this medicine if it has solid particles in it, or if it is cloudy or colored. It should be clear and colorless. Be sure that you are using your pen device correctly.  A special MedGuide will be given to you by the pharmacist with each prescription and refill. Be sure to read this information carefully each time. Talk to your pediatrician regarding the use of this medicine in children. Special care may be needed. Overdosage: If you think you have taken too much of this medicine contact a poison control center or emergency room at once. NOTE: This medicine is only for you. Do not share this medicine with others. What if I miss a dose? If you miss a dose, take it as soon as you can. If it is almost time for your next dose, take only that dose. Do not take double or extra doses. What may interact with this medicine? -digoxin -other medicines to strengthen bone This list may not describe all possible  interactions. Give your health care provider a list of all the medicines, herbs, non-prescription drugs, or dietary supplements you use. Also tell them if you smoke, drink alcohol, or use illegal drugs. Some items may interact with your medicine. What should I watch for while using this medicine? Visit your doctor or health care professional for regular checks on your progress. Your doctor may order blood tests and other tests to see how you are doing. You should make sure you get enough calcium and vitamin D while you are taking this medicine, unless your doctor tells you not to. Discuss the foods you eat and the vitamins you take with your health care professional. Bonita Quin may get drowsy or dizzy. Do not drive, use machinery, or do anything that needs mental alertness until you know how this medicine affects you. Do not stand or sit up quickly, especially if you are an older patient. This reduces the risk of dizzy or fainting spells. What side effects may I notice from receiving this medicine? Side effects that you should report to your doctor or health care professional as soon as possible: -allergic reactions like skin rash, itching or hives, swelling of the face, lips, or tongue -chronic constipation -high blood pressure -irregular heartbeat, chest pain -nausea, vomiting -unusually weak or tired Side effects that usually do not require medical attention (report to your doctor or health care professional if they continue or are bothersome): -bone pain -cough, runny nose -headache -leg cramps -muscle spasms in the  back or legs -pain, redness, irritation or swelling at the injection site -stomach upset -trouble sleeping This list may not describe all possible side effects. Call your doctor for medical advice about side effects. You may report side effects to FDA at 1-800-FDA-1088. Where should I keep my medicine? Keep out of the reach of children. Store in a refrigerator between 2 and 8  degrees C (36 and 46 degrees F). Do not freeze. Recap the pen injector when not in use to protect it from light and damage. Use quickly after taking out of the refrigerator and return to refrigerator right after using. Throw away any unused medicine 28 days after the first injection from the device. Do not use after the expiration date printed on the pen and pen packaging. NOTE: This sheet is a summary. It may not cover all possible information. If you have questions about this medicine, talk to your doctor, pharmacist, or health care provider.  2013, [employer]vier/[employer]. (04/23/2008 5:45:37 PM)

## 2012-08-02 ENCOUNTER — Other Ambulatory Visit: Payer: Self-pay | Admitting: Gynecology

## 2012-08-02 DIAGNOSIS — F319 Bipolar disorder, unspecified: Secondary | ICD-10-CM | POA: Diagnosis not present

## 2012-08-02 DIAGNOSIS — F411 Generalized anxiety disorder: Secondary | ICD-10-CM | POA: Diagnosis not present

## 2012-08-02 DIAGNOSIS — E559 Vitamin D deficiency, unspecified: Secondary | ICD-10-CM

## 2012-08-02 LAB — VITAMIN D 25 HYDROXY (VIT D DEFICIENCY, FRACTURES): Vit D, 25-Hydroxy: 27 ng/mL — ABNORMAL LOW (ref 30–89)

## 2012-08-02 MED ORDER — ERGOCALCIFEROL 1.25 MG (50000 UT) PO CAPS: 50000.0000 [IU] | ORAL_CAPSULE | ORAL | Status: DC

## 2012-08-03 ENCOUNTER — Encounter (HOSPITAL_COMMUNITY): Payer: Self-pay | Admitting: *Deleted

## 2012-08-03 NOTE — Progress Notes (Signed)
   [facility] Follow-up Outpatient Visit  [name] [birth year]  Date: 08/01/2012   Subjective: [name] presents today to followup on her treatment for depression. She has been recently subjected to a significant amount of stress secondary to back pain. She has been diagnosed with a pathologic oral fracture of her first lumbar vertebrae. She is also been subsequently diagnosed with osteopenia. These diagnoses took several visits to the emergency department as well as other specialists. She was treated with opiate pain relievers for a while, but now is only taking naproxen. she is also been given a support brace to wear. Because of her pain she has not been able to return to the [facility]. She brought paperwork from her insurance provider which states they will not cover her Wellbutrin XL 450 no grams daily. She denies any suicidal or homicidal ideation. She denies any auditory or visual hallucinations.  There were no vitals filed for this visit.  Mental Status Examination  Appearance: casual Alert: Yes Attention: good  Cooperative: Yes Eye Contact: Good Speech: clear and coherent Psychomotor Activity: Normal Memory/Concentration: intact Oriented: person, place, time/date and situation Mood: Dysphoric Affect: Constricted Thought Processes and Associations: Linear Fund of Knowledge: Good Thought Content: normal Insight: Good Judgement: Good  Diagnosis: major depressive disorder, recurrent, moderate; generalized anxiety disorder  Treatment Plan: We will continue her Abilify 10 mg daily, Wellbutrin XL 450 mg daily, Pristiq 100 mg daily. She will return for followup in 2 months. If we are unable to get authorization to continue her Wellbutrin at 450 mg daily we may need to see how she does on 300 mg.   Cheyanna Strick, PA-C

## 2012-08-03 NOTE — Progress Notes (Signed)
Wellbutrin XL 450 mg (Forfivo XL 450 mg) authorized by Monia Pouch from 08/01/12 thru 05/09/13. Patient and pharmacy notified

## 2012-08-04 ENCOUNTER — Emergency Department (HOSPITAL_BASED_OUTPATIENT_CLINIC_OR_DEPARTMENT_OTHER)
Admission: EM | Admit: 2012-08-04 | Discharge: 2012-08-04 | Disposition: A | Discharge status: Home / Self Care | Payer: Medicare Other | Attending: Emergency Medicine | Admitting: Emergency Medicine

## 2012-08-04 ENCOUNTER — Emergency Department (HOSPITAL_BASED_OUTPATIENT_CLINIC_OR_DEPARTMENT_OTHER): Payer: Medicare Other

## 2012-08-04 ENCOUNTER — Encounter (HOSPITAL_BASED_OUTPATIENT_CLINIC_OR_DEPARTMENT_OTHER): Payer: Self-pay | Admitting: Emergency Medicine

## 2012-08-04 DIAGNOSIS — F3289 Other specified depressive episodes: Secondary | ICD-10-CM | POA: Insufficient documentation

## 2012-08-04 DIAGNOSIS — Z8669 Personal history of other diseases of the nervous system and sense organs: Secondary | ICD-10-CM | POA: Insufficient documentation

## 2012-08-04 DIAGNOSIS — Z9089 Acquired absence of other organs: Secondary | ICD-10-CM | POA: Insufficient documentation

## 2012-08-04 DIAGNOSIS — Z7982 Long term (current) use of aspirin: Secondary | ICD-10-CM | POA: Diagnosis not present

## 2012-08-04 DIAGNOSIS — R1013 Epigastric pain: Secondary | ICD-10-CM | POA: Diagnosis not present

## 2012-08-04 DIAGNOSIS — Z3202 Encounter for pregnancy test, result negative: Secondary | ICD-10-CM | POA: Insufficient documentation

## 2012-08-04 DIAGNOSIS — G43909 Migraine, unspecified, not intractable, without status migrainosus: Secondary | ICD-10-CM | POA: Insufficient documentation

## 2012-08-04 DIAGNOSIS — R1084 Generalized abdominal pain: Secondary | ICD-10-CM | POA: Insufficient documentation

## 2012-08-04 DIAGNOSIS — Z8739 Personal history of other diseases of the musculoskeletal system and connective tissue: Secondary | ICD-10-CM | POA: Diagnosis not present

## 2012-08-04 DIAGNOSIS — R109 Unspecified abdominal pain: Secondary | ICD-10-CM

## 2012-08-04 DIAGNOSIS — Z8679 Personal history of other diseases of the circulatory system: Secondary | ICD-10-CM | POA: Diagnosis not present

## 2012-08-04 DIAGNOSIS — Z9851 Tubal ligation status: Secondary | ICD-10-CM | POA: Insufficient documentation

## 2012-08-04 DIAGNOSIS — F329 Major depressive disorder, single episode, unspecified: Secondary | ICD-10-CM | POA: Diagnosis not present

## 2012-08-04 DIAGNOSIS — R197 Diarrhea, unspecified: Secondary | ICD-10-CM | POA: Diagnosis not present

## 2012-08-04 DIAGNOSIS — Z79899 Other long term (current) drug therapy: Secondary | ICD-10-CM | POA: Insufficient documentation

## 2012-08-04 DIAGNOSIS — I10 Essential (primary) hypertension: Secondary | ICD-10-CM | POA: Diagnosis not present

## 2012-08-04 DIAGNOSIS — R1110 Vomiting, unspecified: Secondary | ICD-10-CM | POA: Diagnosis not present

## 2012-08-04 DIAGNOSIS — Z86711 Personal history of pulmonary embolism: Secondary | ICD-10-CM | POA: Insufficient documentation

## 2012-08-04 DIAGNOSIS — Z8719 Personal history of other diseases of the digestive system: Secondary | ICD-10-CM | POA: Diagnosis not present

## 2012-08-04 LAB — URINE MICROSCOPIC-ADD ON

## 2012-08-04 LAB — URINALYSIS, ROUTINE W REFLEX MICROSCOPIC
Leukocytes, UA: NEGATIVE
Nitrite: NEGATIVE
Specific Gravity, Urine: 1.027 (ref 1.005–1.030)
pH: 6 (ref 5.0–8.0)

## 2012-08-04 LAB — COMPREHENSIVE METABOLIC PANEL
Albumin: 4.1 g/dL (ref 3.5–5.2)
BUN: 21 mg/dL (ref 6–23)
Creatinine, Ser: 0.9 mg/dL (ref 0.50–1.10)
Total Protein: 7.6 g/dL (ref 6.0–8.3)

## 2012-08-04 LAB — LIPASE, BLOOD: Lipase: 55 U/L (ref 11–59)

## 2012-08-04 LAB — CBC WITH DIFFERENTIAL/PLATELET
Basophils Relative: 1 % (ref 0–1)
Eosinophils Absolute: 0.1 10*3/uL (ref 0.0–0.7)
HCT: 39.6 % (ref 36.0–46.0)
Hemoglobin: 13.8 g/dL (ref 12.0–15.0)
MCH: 33.4 pg (ref 26.0–34.0)
MCHC: 34.8 g/dL (ref 30.0–36.0)
Monocytes Absolute: 0.5 10*3/uL (ref 0.1–1.0)
Monocytes Relative: 10 % (ref 3–12)
Neutrophils Relative %: 69 % (ref 43–77)
RDW: 15 % (ref 11.5–15.5)

## 2012-08-04 MED ORDER — IOHEXOL 300 MG/ML  SOLN
100.0000 mL | Freq: Once | INTRAMUSCULAR | Status: AC | PRN
  Administered 2012-08-04: 100 mL via INTRAVENOUS

## 2012-08-04 MED ORDER — OXYCODONE-ACETAMINOPHEN 5-325 MG PO TABS: 24.0000 | ORAL_TABLET | ORAL | Status: DC | PRN

## 2012-08-04 MED ORDER — ONDANSETRON 8 MG PO TBDP: 8.0000 mg | ORAL_TABLET | Freq: Three times a day (TID) | ORAL | Status: DC | PRN

## 2012-08-04 MED ORDER — ONDANSETRON HCL 4 MG/2ML IJ SOLN: 4.0000 mg | Freq: Once | INTRAMUSCULAR | Status: AC

## 2012-08-04 MED ORDER — HYDROMORPHONE HCL PF 1 MG/ML IJ SOLN
1.0000 mg | Freq: Once | INTRAMUSCULAR | Status: AC
  Administered 2012-08-04: 1 mg via INTRAVENOUS
  Filled 2012-08-04: qty 1

## 2012-08-04 MED ORDER — ONDANSETRON HCL 4 MG/2ML IJ SOLN
INTRAMUSCULAR | Status: AC
  Administered 2012-08-04: 4 mg via INTRAVENOUS
  Filled 2012-08-04: qty 2

## 2012-08-04 MED ORDER — SODIUM CHLORIDE 0.9 % IV BOLUS (SEPSIS)
1000.0000 mL | Freq: Once | INTRAVENOUS | Status: AC
  Administered 2012-08-04: 1000 mL via INTRAVENOUS

## 2012-08-04 MED ORDER — ONDANSETRON HCL 4 MG/2ML IJ SOLN
4.0000 mg | Freq: Once | INTRAMUSCULAR | Status: AC
  Administered 2012-08-04: 4 mg via INTRAVENOUS
  Filled 2012-08-04: qty 2

## 2012-08-04 MED ORDER — IOHEXOL 300 MG/ML  SOLN
50.0000 mL | Freq: Once | INTRAMUSCULAR | Status: AC | PRN
  Administered 2012-08-04: 50 mL via ORAL

## 2012-08-04 NOTE — ED Notes (Signed)
Patient transported to CT 

## 2012-08-04 NOTE — ED Notes (Signed)
Pt waiting until 1300 to be discharged due to meds given earlier.  Pt walking with steady gait at this time.  A&O x3.  No evidence of altered mentation.

## 2012-08-04 NOTE — ED Provider Notes (Addendum)
History     CSN: [id number]  Arrival date & time 08/04/12  0819   First MD Initiated Contact with Patient 08/04/12 [phone]    Patient is hearing impaired  Chief Complaint  Patient presents with  . Emesis  . Diarrhea  . Abdominal Pain    (Consider location/radiation/quality/duration/timing/severity/associated sxs/prior treatment) HPI This is a 52 year old female who comes in to complaining of some mild diffuse abdominal pain. She states she has been seen for this before. She has had multiple episodes of vomiting over the past 24 hours. She describes this as clear fluid and no blood. She had been taking by mouth but has been unable to take any today. She has had multiple loose bowel movements. She denies any fever or chills. Past Medical History  Diagnosis Date  . Depression   . Migraine   . Fibromyalgia   . Hearing impairment   . Arthritis   . Cardiomyopathy   . Pulmonary embolism   . Gastritis   . Hypertension     Past Surgical History  Procedure Laterality Date  . Appendectomy    . Inner ear surgery      TUBES  . Tonsillectomy and adenoidectomy    . Tubal ligation    . Cardiac surgery  January 2007    mitral valve repair  . Cardiac surgery  1973    atrial septal defect  . Laparoscopic cholecystectomy  2012    Family History  Problem Relation Age of Onset  . Cancer Mother   . Breast cancer Mother   . Depression Sister   . Heart disease Father   . Hypertension Father     History  Substance Use Topics  . Smoking status: Never Smoker   . Smokeless tobacco: Not on file  . Alcohol Use: No    OB History   Grav Para Term Preterm Abortions TAB SAB Ect Mult Living   2 2 2       2       Review of Systems  Allergies  Depakote and Valium  Home Medications   Current Outpatient Rx  Name  Route  Sig  Dispense  Refill  . ARIPiprazole (ABILIFY) 10 MG tablet   Oral   Take 1 tablet (10 mg total) by mouth every morning.   30 tablet   1   . aspirin EC 81 MG  tablet   Oral   Take 81 mg by mouth every morning.         . betamethasone dipropionate (DIPROLENE) 0.05 % cream   Topical   Apply topically 2 (two) times daily.   30 g   2   . BuPROPion HCl ER, XL, (FORFIVO XL) 450 MG TB24   Oral   Take 450 mg by mouth every morning.   30 tablet   1   . Calcium Carbonate-Vitamin D (CALCIUM 600 + D PO)   Oral   Take 1 tablet by mouth 2 (two) times daily.         . carvedilol (COREG) 25 MG tablet   Oral   Take 25 mg by mouth 2 (two) times daily with a meal.         . desvenlafaxine (PRISTIQ) 100 MG 24 hr tablet   Oral   Take 1 tablet (100 mg total) by mouth every morning.   30 tablet   1   . ergocalciferol (VITAMIN D2) 50000 UNITS capsule   Oral   Take 1 capsule (50,000 Units total) by mouth  once a week.   4 capsule   2     Take one weekly. Patient will need to re-check TSH ...   . fish oil-omega-3 fatty acids 1000 MG capsule   Oral   Take 1 g by mouth 2 (two) times daily.          . Flaxseed, Linseed, (FLAXSEED OIL) 1000 MG CAPS   Oral   Take 1,000 mg by mouth 2 (two) times daily.         . hydrochlorothiazide (MICROZIDE) 12.5 MG capsule   Oral   Take 12.5 mg by mouth every morning.          [name] HYDROcodone-acetaminophen (NORCO/VICODIN) 5-325 MG per tablet   Oral   Take 1 tablet by mouth every 6 (six) hours as needed for pain.         [name] loratadine (CLARITIN) 10 MG tablet   Oral   Take 10 mg by mouth daily as needed for allergies.          . Multiple Vitamin (MULTIVITAMIN) tablet   Oral   Take 1 tablet by mouth every morning.          [name] omeprazole (PRILOSEC) 20 MG capsule      Take 1 po BID x 2 weeks then once a day   60 capsule   0   . ondansetron (ZOFRAN ODT) 8 MG disintegrating tablet   Oral   Take 1 tablet (8 mg total) by mouth every 8 (eight) hours as needed for nausea.   20 tablet   0   . ranitidine (ZANTAC) 75 MG tablet   Oral   Take 75 mg by mouth daily as needed for heartburn.            BP 144/96  Pulse 113  Temp(Src) 98.8 F (37.1 C) (Oral)  Resp 20  Ht 5' 5.5" (1.664 m)  Wt 155 lb (70.308 kg)  BMI 25.39 kg/m2  SpO2 100%  Physical Exam  Nursing note and vitals reviewed. Constitutional: She is oriented to person, place, and time. She appears well-developed and well-nourished.  HENT:  Head: Normocephalic and atraumatic.  Right Ear: External ear normal.  Left Ear: External ear normal.  Mouth/Throat: Oropharynx is clear and moist.  Eyes: Conjunctivae are normal. Pupils are equal, round, and reactive to light.  Neck: Normal range of motion.  Cardiovascular: Normal rate, regular rhythm and normal heart sounds.   Pulmonary/Chest: Effort normal and breath sounds normal.  Abdominal: Soft. Bowel sounds are normal. She exhibits no distension.  Mild diffuse ttp  Musculoskeletal: Normal range of motion.  Neurological: She is alert and oriented to person, place, and time.  Skin: Skin is warm and dry.  Psychiatric: She has a normal mood and affect. Her behavior is normal. Judgment and thought content normal.    ED Course  Procedures (including critical care time)  Labs Reviewed  COMPREHENSIVE METABOLIC PANEL - Abnormal; Notable for the following:    Glucose, Bld 112 (*)    Alkaline Phosphatase 144 (*)    GFR calc non Af Amer 73 (*)    GFR calc Af Amer 84 (*)    All other components within normal limits  URINALYSIS, ROUTINE W REFLEX MICROSCOPIC - Abnormal; Notable for the following:    Color, Urine AMBER (*)    Hgb urine dipstick MODERATE (*)    All other components within normal limits  URINE MICROSCOPIC-ADD ON - Abnormal; Notable for the following:    Bacteria, UA  FEW (*)    All other components within normal limits  CBC WITH DIFFERENTIAL  LIPASE, BLOOD  PREGNANCY, URINE   Ct Abdomen Pelvis W Contrast  08/04/2012  *RADIOLOGY REPORT*  Clinical Data: Vomiting.  Epigastric pain.  CT ABDOMEN AND PELVIS WITH CONTRAST  Technique:  Multidetector CT imaging  of the abdomen and pelvis was performed following the standard protocol during bolus administration of intravenous contrast.  Contrast: 50mL OMNIPAQUE IOHEXOL 300 MG/ML  SOLN, OMNIPAQUE IOHEXOL 300 MG/ML  SOLN  Comparison: 06/30/2012  Findings: Lung bases:  The lung bases are clear.  Small hiatal hernia is identified.  No pleural or pericardial effusion noted.  Abdomen/pelvis:  There is no suspicious liver abnormality. Previous cholecystectomy.  No biliary dilatation.  The pancreas is unremarkable.  Normal appearance of the spleen.  Both of the adrenal glands are both normal.  The right kidney is normal.  The left kidney is also normal.  Urinary bladder is within normal limits.  The uterus and the adnexal structures appear normal for patient's age.  No free fluid or fluid collections within the upper abdomen or pelvis.  The small bowel loops have a normal course and caliber. The appendix is within normal limits.  Normal appearance of the colon.  Bones/Musculoskeletal:  Review of the visualized bony structures is significant for degenerative disc disease within the lumbar spine. Deformity involving the superior endplate of the L1 vertebra is slightly progressive from 06/30/2012 and t is new from 05/19/2012. There is loss of approximately 15-20% of the vertebral body height. Mild retropulsion of the bony fragment is noted posteriorly.  IMPRESSION:  1.  Compression deformity involves the L1 vertebra is new from 05/19/2012 and is slightly progressive from CT dated 06/30/2012. There is mild retropulsion of fracture fragments at this level. Loss of approximately 15% of these superior endplate noted.  2.  No acute findings within the abdomen or pelvis.  These results will be called to the ordering clinician or representative by the Radiologist Assistant, and communication documented in the PACS Dashboard.   Original Report Authenticated By: Signa Kell, M.D.      No diagnosis found.   Results for orders  placed during the hospital encounter of 08/04/12  CBC WITH DIFFERENTIAL      Result Value Range   WBC 4.7  4.0 - 10.5 K/uL   RBC 4.13  3.87 - 5.11 MIL/uL   Hemoglobin 13.8  12.0 - 15.0 g/dL   HCT 40.9  81.1 - 91.4 %   MCV 95.9  78.0 - 100.0 fL   MCH 33.4  26.0 - 34.0 pg   MCHC 34.8  30.0 - 36.0 g/dL   RDW 78.2  95.6 - 21.3 %   Platelets 314  150 - 400 K/uL   Neutrophils Relative 69  43 - 77 %   Neutro Abs 3.2  1.7 - 7.7 K/uL   Lymphocytes Relative 19  12 - 46 %   Lymphs Abs 0.9  0.7 - 4.0 K/uL   Monocytes Relative 10  3 - 12 %   Monocytes Absolute 0.5  0.1 - 1.0 K/uL   Eosinophils Relative 1  0 - 5 %   Eosinophils Absolute 0.1  0.0 - 0.7 K/uL   Basophils Relative 1  0 - 1 %   Basophils Absolute 0.1  0.0 - 0.1 K/uL  COMPREHENSIVE METABOLIC PANEL      Result Value Range   Sodium 139  135 - 145 mEq/L   Potassium 3.9  3.5 - 5.1 mEq/L   Chloride 99  96 - 112 mEq/L   CO2 23  19 - 32 mEq/L   Glucose, Bld 112 (*) 70 - 99 mg/dL   BUN 21  6 - 23 mg/dL   Creatinine, Ser 1.61  0.50 - 1.10 mg/dL   Calcium 9.7  8.4 - 09.6 mg/dL   Total Protein 7.6  6.0 - 8.3 g/dL   Albumin 4.1  3.5 - 5.2 g/dL   AST 23  0 - 37 U/L   ALT 18  0 - 35 U/L   Alkaline Phosphatase 144 (*) 39 - 117 U/L   Total Bilirubin 0.4  0.3 - 1.2 mg/dL   GFR calc non Af Amer 73 (*) >90 mL/min   GFR calc Af Amer 84 (*) >90 mL/min  LIPASE, BLOOD      Result Value Range   Lipase 55  11 - 59 U/L  URINALYSIS, ROUTINE W REFLEX MICROSCOPIC      Result Value Range   Color, Urine AMBER (*) YELLOW   APPearance CLEAR  CLEAR   Specific Gravity, Urine 1.027  1.005 - 1.030   pH 6.0  5.0 - 8.0   Glucose, UA NEGATIVE  NEGATIVE mg/dL   Hgb urine dipstick MODERATE (*) NEGATIVE   Bilirubin Urine NEGATIVE  NEGATIVE   Ketones, ur NEGATIVE  NEGATIVE mg/dL   Protein, ur NEGATIVE  NEGATIVE mg/dL   Urobilinogen, UA 0.2  0.0 - 1.0 mg/dL   Nitrite NEGATIVE  NEGATIVE   Leukocytes, UA NEGATIVE  NEGATIVE  PREGNANCY, URINE      Result Value  Range   Preg Test, Ur NEGATIVE  NEGATIVE  URINE MICROSCOPIC-ADD ON      Result Value Range   Squamous Epithelial / LPF RARE  RARE   WBC, UA 0-2  <3 WBC/hpf   RBC / HPF 7-10  <3 RBC/hpf   Bacteria, UA FEW (*) RARE   Urine-Other MUCOUS PRESENT     Ct Abdomen Pelvis W Contrast  08/04/2012  *RADIOLOGY REPORT*  Clinical Data: Vomiting.  Epigastric pain.  CT ABDOMEN AND PELVIS WITH CONTRAST  Technique:  Multidetector CT imaging of the abdomen and pelvis was performed following the standard protocol during bolus administration of intravenous contrast.  Contrast: 50mL OMNIPAQUE IOHEXOL 300 MG/ML  SOLN, OMNIPAQUE IOHEXOL 300 MG/ML  SOLN  Comparison: 06/30/2012  Findings: Lung bases:  The lung bases are clear.  Small hiatal hernia is identified.  No pleural or pericardial effusion noted.  Abdomen/pelvis:  There is no suspicious liver abnormality. Previous cholecystectomy.  No biliary dilatation.  The pancreas is unremarkable.  Normal appearance of the spleen.  Both of the adrenal glands are both normal.  The right kidney is normal.  The left kidney is also normal.  Urinary bladder is within normal limits.  The uterus and the adnexal structures appear normal for patient's age.  No free fluid or fluid collections within the upper abdomen or pelvis.  The small bowel loops have a normal course and caliber. The appendix is within normal limits.  Normal appearance of the colon.  Bones/Musculoskeletal:  Review of the visualized bony structures is significant for degenerative disc disease within the lumbar spine. Deformity involving the superior endplate of the L1 vertebra is slightly progressive from 06/30/2012 and t is new from 05/19/2012. There is loss of approximately 15-20% of the vertebral body height. Mild retropulsion of the bony fragment is noted posteriorly.  IMPRESSION:  1.  Compression deformity involves the L1  vertebra is new from 05/19/2012 and is slightly progressive from CT dated 06/30/2012. There is  mild retropulsion of fracture fragments at this level. Loss of approximately 15% of these superior endplate noted.  2.  No acute findings within the abdomen or pelvis.  These results will be called to the ordering clinician or representative by the Radiologist Assistant, and communication documented in the PACS Dashboard.   Original Report Authenticated By: Signa Kell, M.D.    Dg Bone Density I discussed ct findings with radiologist and with patient.  She has nto had any neurologic symtpoms-denies loss of bowel or bladder control, numbness,t ingling or weakness.  She has seen Dr. Phoebe Perch and is scheduled to see him next Friday.  I suspect that the abdominal pain secondary to the worsening l1 fx.  She has not been wearing her brace.  She is advised to wear it at all times.  She does not appear to have any intrabdominal abnormalities.  She is to receive hydrocodone to take four times daily.    Hilario Quarry, MD 08/04/12 1216  Hilario Quarry, MD 08/31/12 [phone]

## 2012-08-04 NOTE — ED Notes (Signed)
Vomiting, Severe epigastric pain and diarrhea since 0800 yesterday morning.  Has not been able to keep anything down at all.

## 2012-08-07 DIAGNOSIS — M8448XA Pathological fracture, other site, initial encounter for fracture: Secondary | ICD-10-CM | POA: Diagnosis not present

## 2012-08-10 DIAGNOSIS — L259 Unspecified contact dermatitis, unspecified cause: Secondary | ICD-10-CM | POA: Diagnosis not present

## 2012-08-15 DIAGNOSIS — K219 Gastro-esophageal reflux disease without esophagitis: Secondary | ICD-10-CM | POA: Diagnosis not present

## 2012-08-15 DIAGNOSIS — K449 Diaphragmatic hernia without obstruction or gangrene: Secondary | ICD-10-CM | POA: Diagnosis not present

## 2012-08-16 ENCOUNTER — Telehealth: Payer: Self-pay | Admitting: *Deleted

## 2012-08-16 NOTE — Telephone Encounter (Signed)
I spoke with Patient and her mom about her insurance not wanting to cover Forteo bc hasnt tried another medication first. Therefore Dr Glenetta Hew advised Reclast. I went over the Reclast and side effects and pt requested a consult to go over it with Dr Glenetta Hew. Apt made. KW

## 2012-08-17 ENCOUNTER — Telehealth: Payer: Self-pay | Admitting: *Deleted

## 2012-08-17 ENCOUNTER — Ambulatory Visit (INDEPENDENT_AMBULATORY_CARE_PROVIDER_SITE_OTHER): Payer: Medicare Other | Admitting: Gynecology

## 2012-08-17 ENCOUNTER — Encounter: Payer: Self-pay | Admitting: Gynecology

## 2012-08-17 DIAGNOSIS — M810 Age-related osteoporosis without current pathological fracture: Secondary | ICD-10-CM | POA: Diagnosis not present

## 2012-08-17 DIAGNOSIS — E559 Vitamin D deficiency, unspecified: Secondary | ICD-10-CM | POA: Diagnosis not present

## 2012-08-17 DIAGNOSIS — S32019D Unspecified fracture of first lumbar vertebra, subsequent encounter for fracture with routine healing: Secondary | ICD-10-CM

## 2012-08-17 NOTE — Progress Notes (Signed)
Patient is a 52 year old who was recently diagnosed with a spontaneous L1 vertebral fracture and has been followed by the spine specialist Dr. Colon Branch who placed her on a back brace and gave her pain. See previous note for details from office visit 08/17/2012.  Patient's recent lumbar spine report by the radiologist was follows:  There are five lumbar type vertebral bodies. The  alignment is stable with a convex left scoliosis. There is a new  superior endplate compression fracture at L1 with less than 20%  loss of vertebral body height. No osseous retropulsion or widening  of the interpedicular distance is identified. No other acute  osseous findings are identified. The advanced degenerative disc  disease and endplate sclerosis at L2-L3 are stable. Lesser  endplate degenerative changes at L4-L5 and fragmentation of the  anterior-superior corner of the L4 vertebral body are unchanged.  Patient subsequently had a bone density study on 07/26/2012 with the following result:  AP spine T score of -1.3  Right femoral neck T score of -2.1  A Frax analysis was not done because of her recently found vertebral fracture   Calcium level March 15 9.9 normal range   Patient's recent PTH level was normal but her vitamin D level was low at 27. She was prescribe vitamin D 50,000 units q. Weekly for 12 weeks. She is to return to the office after the 12 weeks her vitamin D level. After the 12 weeks she will go on vitamin D 3 2000 units daily. Patient's recent BUN and creatinine were normal. Her insurance company would not cover for her to have the Forteo which was my initial recommendation to build up her bones. She will be placed on Reclast IV Q. Yearly. We discussed the long-term risk of antiresorptive agents to include osteonecrosis of the jaw and spontaneous fracture of the head. We will do a bone density study along with a vertebral fracture assessment one year after initiation of Reclast. Patient by  definition from the [employer] is osteoporotic based on the findings of a fractured L1 vertebra. Literature formation was provided on medication. We'll send a copy of this office note to Dr. Colon Branch who is also entertained the idea of vertebroplasty and she has followup with him.

## 2012-08-17 NOTE — Telephone Encounter (Signed)
Reclast apt 4/22 at 10am. Pt informed and instruction sheet mailed to pt. Order faxed [name]

## 2012-08-17 NOTE — Patient Instructions (Signed)
Zoledronic acid: Patient drug information   Access [employer] Online here.  Copyright [phone] [employer] All rights reserved.  (For additional information see "Zoledronic acid: Drug information") Brand Names: [place]  Reclast;  Zometa Brand Names: [place]  Aclasta;  Zometa What is this drug used for?  . ZHY:QMVH:Q469:G2952841 It is used to treat high calcium levels.  . LKG:MWNU:U725:D6644034 It is used when treating some cancers.  . VQQ:VZDG:L875:I4332951 It is used to treat Paget's disease.  . OAC:ZYSA:Y301:S0109323 It is used to put off or treat soft, brittle bones (osteoporosis).  . FTD:DUKG:U542:H0623762 It may be given to you for other reasons. Talk with the doctor. What do I need to tell my doctor BEFORE I take this drug?  GBT:DVVO:H607:P7106269 All products:  . SWN:IOEV:O350:K9381829 If you have an allergy to zoledronic acid or any other part of this drug.  . HBZ:JIRC:V893:Y1017510 If you are allergic to any drugs like this one, any other drugs, foods, or other substances. Tell your doctor about the allergy and what signs you had, like rash; hives; itching; shortness of breath; wheezing; cough; swelling of face, lips, tongue, or throat; or any other signs.  CHE:NIDP:O242:P5361443 Reclast:  . XVQ:MGQQ:P619:J0932671 If you have low calcium levels.  . IWP:YKDX:I338:S5053976 If you have very bad kidney disease.  BHA:LPFX:T024:O9735329 This is not a list of all drugs or health problems that interact with this drug.  JME:QAST:M196:Q2297989 Tell your doctor and pharmacist about all of your drugs (prescription or OTC, natural products, vitamins) and health problems. You must check to make sure that it is safe for you to take this drug with all of your drugs and health problems. Do not start, stop, or change the dose of any drug without checking with your doctor. What are some things I need to know or do while I take this drug?  QJJ:HERD:E081:K4818563 All products:  .  JSH:FWYO:V785:Y8502774 Tell dentists, surgeons, and other doctors that you use this drug.  . JOI:NOMV:E720:N4709628 Worsening of asthma has happened in people taking drugs like this one. Talk with your doctor.  . ZMO:QHUT:M546:T0354656 This drug may raise the chance of a broken leg. Talk with your doctor.  . CLE:XNTZ:G017:C9449675 Have your blood work checked often. Talk with your doctor.  . FFM:BWGY:K599:J5701779 Have a bone density test. Talk with your doctor.  . TJQ:ZESP:Q330:Q7622633 Have a dental exam before starting this drug.  . HLK:TGYB:W389:H7342876 Take good care of your teeth. See a dentist often.  . OTL:XBWI:O035:D9741638 Do not give to a child. Talk with your doctor.  . GTX:MIWO:E321:Y2482500 If you are 67 or older, use this drug with care. You could have more side effects.  . BBC:WUGQ:B169:I5038882 This drug may cause harm to the unborn baby if you take it while you are pregnant.  . CMK:LKJZ:P915:A5697948 Tell your doctor if you are pregnant or plan on getting pregnant. You will need to talk about the benefits and risks of using this drug while you are pregnant.  . AXK:PVVZ:S827:M7867544 Tell your doctor if you are breast-feeding. You will need to talk about any risks to your baby.  . BEE:FEOF:H219:X5883254 If you think there has been an overdose, call your poison control center or get medical care right away. Be ready to tell or show what was taken, how much, and when it happened.  DIY:MEBR:A309:M0768088 Zometa:  . PJS:RPRX:Y585:F2924462 Take calcium and vitamin D as you were told by your doctor.  MMN:OTRR:N165:B9038333 Reclast:  . OVA:NVBT:Y606:Y0459977 This drug works best when used with calcium/vitamin D and weight-bearing workouts like walking or PT (  physical therapy).  . JXB:JYNW:G956:O1308657 Follow the diet and workout plan that your doctor told you about.  . QIO:NGEX:B284:X3244010 Use birth control that you can trust to prevent pregnancy while taking this drug. What are  some side effects that I need to call my doctor about right away?  UVO:ZDGU:Y403:K7425956 WARNING/CAUTION: Even though it may be rare, some people may have very bad and sometimes deadly side effects when taking a drug. Tell your doctor or get medical help right away if you have any of the following signs or symptoms that may be related to a very bad side effect:  . LOV:FIEP:P295:J8841660 Signs of an allergic reaction, like rash; hives; itching; red, swollen, blistered, or peeling skin with or without fever; wheezing; tightness in the chest or throat; trouble breathing or talking; unusual hoarseness; or swelling of the mouth, face, lips, tongue, or throat.  . YTK:ZSWF:U932:T5573220 Signs of low calcium levels like muscle cramps or spasms, numbness and tingling, or seizures.  . URK:YHCW:C376:E8315176 Signs of kidney problems like unable to pass urine, change in the amount of urine passed, blood in the urine, or a big weight gain.  . HYW:VPXT:G626:R4854627 Very bad bone, joint, or muscle pain.  . OJJ:KKXF:G182:X9371696 Any new or strange groin, hip, or thigh pain.  . VEL:FYBO:F751:W2585277 Chest pain.  . OEU:MPNT:I144:R1540086 A heartbeat that does not feel normal.  . PYP:PJKD:T267:T2458099 Slow heartbeat.  . IPJ:ASNK:N397:Q7341937 Change in eyesight.  . TKW:IOXB:D532:D9242683 Eye pain.  . MHD:QQIW:L798:X2119417 Mouth sores.  . EYC:XKGY:J856:D1497026 Trouble swallowing.  . VZC:HYIF:O277:A1287867 Very bad pain when swallowing.  . EHM:CNOB:S962:E3662947 Any bruising or bleeding.  . MLY:YTKP:T465:K8127517 Pain where the shot was given.  . GYF:VCBS:W967:R9163846 Redness or swelling where the shot is given.  . KZL:DJTT:S177:L3903009 This drug may cause jawbone problems. The chance may be higher the longer you take this drug. The chance may be higher if you have cancer, dental problems, dentures that do not fit well, anemia, blood clotting problems, or an infection. The chance may also be higher if you are  having dental work or if you are getting chemo, some steroid drugs, or radiation. Call your doctor right away if you have jaw swelling or pain. What are some other side effects of this drug?  QZR:AQTM:A263:F3545625 All drugs may cause side effects. However, many people have no side effects or only have minor side effects. Call your doctor or get medical help if any of these side effects or any other side effects bother you or do not go away:  WLS:LHTD:S287:G8115726 All products:  . OMB:TDHR:C163:A4536468 Dizziness.  . EHO:ZYYQ:M250:I3704888 Upset stomach or throwing up.  . BVQ:XIHW:T888:K8003491 Irritation where the shot is given.  . PHX:TAVW:P794:I0165537 Feeling tired or weak.  . SMO:LMBE:M754:G9201007 Belly pain.  . HQR:FXJO:I325:Q9826415 Headache.  . AXE:NMMH:W808:U1103159 Flu-like signs.  . YVO:PFYT:W446:K8638177 Loose stools (diarrhea).  . NHA:FBXU:X833:X8329191 Muscle or joint pain.  . YOM:AYOK:H997:F4142395 Back pain.  VUY:EBXI:D568:S1683729 Zometa:  . MSX:JDBZ:M080:E2336122 Not able to sleep.  . ESL:PNPY:Y511:M2111735 Not hungry.  . APO:LIDC:V013:H4388875 Hard stools (constipation).  . ZVJ:KQAS:U015:I1537943 Weight loss.  . EXM:DYJW:L295:F4734037 Cough.  QDU:KRCV:K184:C3754360 These are not all of the side effects that may occur. If you have questions about side effects, call your doctor. Call your doctor for medical advice about side effects.  OVP:CHEK:B524:E1859093 You may report side effects to your national health agency. How is this drug best taken?  JPE:TKKO:E695:Q7225750 Use this drug as ordered by your doctor. Read and follow the dosing on the label closely.  NXG:ZFPO:I518:F8421031 All products:  . YOF:VWAQ:L737:V6681594 It is given  as a shot into a vein over a period of time.  . ZOX:WRUE:A540:J8119147 Drink lots of noncaffeine liquids unless told to drink less liquid by your doctor.  WGN:FAOZ:H086:V7846962 Reclast:  . XBM:WUXL:K440:N0272536 Acetaminophen may be given  to lower fever and chills.  . UYQ:IHKV:Q259:D6387564 Drink at least 2 glasses of liquids a few hours before you get this drug. What do I do if I miss a dose?  . PPI:RJJO:A416:S0630160 Call your doctor to find out what to do. How do I store and/or throw out this drug?  . FUX:NATF:T732:K0254270 This drug will be given to you in a hospital or doctor's office. You will not store it at home.  . WCB:JSEG:B151:V6160737 Keep all drugs out of the reach of children and pets.  . TGG:YIRS:W546:E7035009 Check with your pharmacist about how to throw out unused drugs.  General drug facts  . FGH:WEXH:B716:R6789381 If your symptoms or health problems do not get better or if they become worse, call your doctor.  . OFB:PZWC:H852:D7824235 Do not share your drugs with others and do not take anyone else's drugs.  . TIR:WERX:V400:Q6761950 Keep a list of all your drugs (prescription, natural products, vitamins, OTC) with you. Give this list to your doctor.  . DTO:IZTI:W580:D9833825 Talk with the doctor before starting any new drug, including prescription or OTC, natural products, or vitamins.  . KNL:ZJQB:H419:F7902409 Some drugs may have another patient information leaflet. If you have any questions about this drug, please talk with your doctor, pharmacist, or other health care provider.

## 2012-08-28 ENCOUNTER — Other Ambulatory Visit (HOSPITAL_COMMUNITY): Payer: Self-pay | Admitting: *Deleted

## 2012-08-29 ENCOUNTER — Ambulatory Visit (HOSPITAL_COMMUNITY)
Admission: RE | Admit: 2012-08-29 | Discharge: 2012-08-29 | Disposition: A | Discharge status: Home / Self Care | Payer: Medicare Other | Source: Ambulatory Visit | Attending: Gynecology | Admitting: Gynecology

## 2012-08-29 DIAGNOSIS — M810 Age-related osteoporosis without current pathological fracture: Secondary | ICD-10-CM | POA: Diagnosis not present

## 2012-08-29 MED ORDER — ZOLEDRONIC ACID 5 MG/100ML IV SOLN
INTRAVENOUS | Status: AC
  Administered 2012-08-29: 5 mg via INTRAVENOUS
  Filled 2012-08-29: qty 100

## 2012-08-29 MED ORDER — ZOLEDRONIC ACID 5 MG/100ML IV SOLN: 5.0000 mg | Freq: Once | INTRAVENOUS | Status: AC

## 2012-08-31 ENCOUNTER — Encounter (HOSPITAL_COMMUNITY): Payer: Self-pay | Admitting: Emergency Medicine

## 2012-08-31 ENCOUNTER — Emergency Department (HOSPITAL_COMMUNITY)
Admission: EM | Admit: 2012-08-31 | Discharge: 2012-08-31 | Disposition: A | Discharge status: Home Health | Payer: Medicare Other | Attending: Emergency Medicine | Admitting: Emergency Medicine

## 2012-08-31 DIAGNOSIS — R300 Dysuria: Secondary | ICD-10-CM | POA: Insufficient documentation

## 2012-08-31 DIAGNOSIS — Z8739 Personal history of other diseases of the musculoskeletal system and connective tissue: Secondary | ICD-10-CM | POA: Insufficient documentation

## 2012-08-31 DIAGNOSIS — Z8719 Personal history of other diseases of the digestive system: Secondary | ICD-10-CM | POA: Insufficient documentation

## 2012-08-31 DIAGNOSIS — F1010 Alcohol abuse, uncomplicated: Secondary | ICD-10-CM

## 2012-08-31 DIAGNOSIS — Z79899 Other long term (current) drug therapy: Secondary | ICD-10-CM | POA: Insufficient documentation

## 2012-08-31 DIAGNOSIS — F329 Major depressive disorder, single episode, unspecified: Secondary | ICD-10-CM | POA: Insufficient documentation

## 2012-08-31 DIAGNOSIS — I10 Essential (primary) hypertension: Secondary | ICD-10-CM | POA: Insufficient documentation

## 2012-08-31 DIAGNOSIS — F10229 Alcohol dependence with intoxication, unspecified: Secondary | ICD-10-CM | POA: Insufficient documentation

## 2012-08-31 DIAGNOSIS — K859 Acute pancreatitis without necrosis or infection, unspecified: Secondary | ICD-10-CM

## 2012-08-31 DIAGNOSIS — Z8679 Personal history of other diseases of the circulatory system: Secondary | ICD-10-CM | POA: Insufficient documentation

## 2012-08-31 DIAGNOSIS — Z86711 Personal history of pulmonary embolism: Secondary | ICD-10-CM | POA: Insufficient documentation

## 2012-08-31 DIAGNOSIS — F3289 Other specified depressive episodes: Secondary | ICD-10-CM | POA: Insufficient documentation

## 2012-08-31 DIAGNOSIS — Z7982 Long term (current) use of aspirin: Secondary | ICD-10-CM | POA: Insufficient documentation

## 2012-08-31 LAB — CBC WITH DIFFERENTIAL/PLATELET
Basophils Absolute: 0 10*3/uL (ref 0.0–0.1)
Basophils Relative: 0 % (ref 0–1)
Eosinophils Absolute: 0 10*3/uL (ref 0.0–0.7)
Hemoglobin: 14.9 g/dL (ref 12.0–15.0)
MCH: 32.9 pg (ref 26.0–34.0)
MCHC: 35.7 g/dL (ref 30.0–36.0)
Neutro Abs: 4.6 10*3/uL (ref 1.7–7.7)
Neutrophils Relative %: 79 % — ABNORMAL HIGH (ref 43–77)
Platelets: 168 10*3/uL (ref 150–400)

## 2012-08-31 LAB — URINALYSIS, ROUTINE W REFLEX MICROSCOPIC
Glucose, UA: NEGATIVE mg/dL
Ketones, ur: 15 mg/dL — AB
Nitrite: NEGATIVE
Specific Gravity, Urine: 1.017 (ref 1.005–1.030)
pH: 6 (ref 5.0–8.0)

## 2012-08-31 LAB — URINE MICROSCOPIC-ADD ON

## 2012-08-31 LAB — COMPREHENSIVE METABOLIC PANEL
ALT: 14 U/L (ref 0–35)
AST: 26 U/L (ref 0–37)
Albumin: 4.3 g/dL (ref 3.5–5.2)
Alkaline Phosphatase: 109 U/L (ref 39–117)
Calcium: 9.7 mg/dL (ref 8.4–10.5)
GFR calc Af Amer: >90 mL/min (ref >90)
Potassium: 3.4 mEq/L — ABNORMAL LOW (ref 3.5–5.1)
Sodium: 140 mEq/L (ref 135–145)
Total Protein: 7.8 g/dL (ref 6.0–8.3)

## 2012-08-31 LAB — LIPASE, BLOOD: Lipase: 89 U/L — ABNORMAL HIGH (ref 11–59)

## 2012-08-31 MED ORDER — MORPHINE SULFATE 4 MG/ML IJ SOLN
4.0000 mg | Freq: Once | INTRAMUSCULAR | Status: AC
  Administered 2012-08-31: 4 mg via INTRAVENOUS
  Filled 2012-08-31: qty 1

## 2012-08-31 MED ORDER — ONDANSETRON HCL 4 MG/2ML IJ SOLN
4.0000 mg | Freq: Once | INTRAMUSCULAR | Status: AC
  Administered 2012-08-31: 4 mg via INTRAVENOUS
  Filled 2012-08-31: qty 2

## 2012-08-31 MED ORDER — ONDANSETRON HCL 4 MG PO TABS: 4.0000 mg | ORAL_TABLET | Freq: Three times a day (TID) | ORAL | Status: DC | PRN

## 2012-08-31 MED ORDER — SODIUM CHLORIDE 0.9 % IV BOLUS (SEPSIS)
1000.0000 mL | Freq: Once | INTRAVENOUS | Status: AC
  Administered 2012-08-31: 1000 mL via INTRAVENOUS

## 2012-08-31 MED ORDER — HYDROCODONE-ACETAMINOPHEN 5-325 MG PO TABS: 1.0000 | ORAL_TABLET | ORAL | Status: DC | PRN

## 2012-08-31 NOTE — ED Provider Notes (Signed)
History     CSN: [id number]  Arrival date & time 08/31/12  1033   First MD Initiated Contact with Patient 08/31/12 1108      Chief Complaint  Patient presents with  . Abdominal Pain  . Dysuria    (Consider location/radiation/quality/duration/timing/severity/associated sxs/prior treatment) HPI Comments: 67 y F with PMH of depression, migraine, FM, CM, prior alcoholism and HTN here after referral from PCP for elevated lipase found on laboratory testing for epigastric abdominal pain.  Pain has been present for several days and is worsening.  She has a hx of alcoholism and recently relapsed, drinking a bottle of Vodka a few days ago.  +loose stools, nausea and mild dysuria on ROS.  No vomiting. She also received a Reclast infusion recently, as well.  Patient is a 52 y.o. female presenting with abdominal pain. The history is provided by the patient.  Abdominal Pain Pain location:  Epigastric Pain quality: aching   Pain radiates to:  Does not radiate Pain severity:  Moderate Onset quality:  Gradual Duration:  3 days Timing:  Constant Progression:  Worsening Chronicity:  New Context: alcohol use (hx of alcoholism, relapsed several days ago with Vodka)   Ineffective treatments:  None tried Associated symptoms: no chest pain, no chills, no fever, no shortness of breath, no vaginal bleeding, no vaginal discharge and no vomiting     Past Medical History  Diagnosis Date  . Depression   . Migraine   . Fibromyalgia   . Hearing impairment   . Arthritis   . Cardiomyopathy   . Pulmonary embolism   . Gastritis   . Hypertension     Past Surgical History  Procedure Laterality Date  . Appendectomy    . Inner ear surgery      TUBES  . Tonsillectomy and adenoidectomy    . Tubal ligation    . Cardiac surgery  January 2007    mitral valve repair  . Cardiac surgery  1973    atrial septal defect  . Laparoscopic cholecystectomy  2012    Family History  Problem Relation Age of Onset   . Cancer Mother   . Breast cancer Mother   . Depression Sister   . Heart disease Father   . Hypertension Father     History  Substance Use Topics  . Smoking status: Never Smoker   . Smokeless tobacco: Not on file  . Alcohol Use: No    OB History   Grav Para Term Preterm Abortions TAB SAB Ect Mult Living   2 2 2       2       Review of Systems  Constitutional: Negative for fever and chills.  Respiratory: Negative for chest tightness and shortness of breath.   Cardiovascular: Negative for chest pain.  Gastrointestinal: Positive for abdominal pain. Negative for vomiting.  Genitourinary: Negative for vaginal bleeding, vaginal discharge, difficulty urinating and vaginal pain.  All other systems reviewed and are negative.    Allergies  Depakote and Valium  Home Medications   Current Outpatient Rx  Name  Route  Sig  Dispense  Refill  . ARIPiprazole (ABILIFY) 10 MG tablet   Oral   Take 1 tablet (10 mg total) by mouth every morning.   30 tablet   1   . aspirin EC 81 MG tablet   Oral   Take 81 mg by mouth every morning.         . BuPROPion HCl ER, XL, (FORFIVO XL) 450 MG TB24  Oral   Take 450 mg by mouth every morning.   30 tablet   1   . Calcium Carbonate-Vitamin D (CALCIUM 600 + D PO)   Oral   Take 1 tablet by mouth 2 (two) times daily.         . carvedilol (COREG) 25 MG tablet   Oral   Take 25 mg by mouth 2 (two) times daily with a meal.         . desvenlafaxine (PRISTIQ) 100 MG 24 hr tablet   Oral   Take 1 tablet (100 mg total) by mouth every morning.   30 tablet   1   . fish oil-omega-3 fatty acids 1000 MG capsule   Oral   Take 1 g by mouth 2 (two) times daily.          . Flaxseed, Linseed, (FLAXSEED OIL) 1000 MG CAPS   Oral   Take 1,000 mg by mouth 2 (two) times daily.         . hydrochlorothiazide (MICROZIDE) 12.5 MG capsule   Oral   Take 12.5 mg by mouth every morning.          . Multiple Vitamin (MULTIVITAMIN) tablet    Oral   Take 1 tablet by mouth every morning.          [name] omeprazole (PRILOSEC) 20 MG capsule   Oral   Take 20 mg by mouth daily.         . zoledronic acid (RECLAST) 5 MG/100ML SOLN   Intravenous   Inject 5 mg into the vein once.         [name] HYDROcodone-acetaminophen (NORCO/VICODIN) 5-325 MG per tablet   Oral   Take 1 tablet by mouth every 4 (four) hours as needed for pain.   20 tablet   0   . ondansetron (ZOFRAN) 4 MG tablet   Oral   Take 1 tablet (4 mg total) by mouth every 8 (eight) hours as needed for nausea.   15 tablet   0     BP 156/105  Pulse 120  Temp(Src) 98.3 F (36.8 C) (Oral)  Resp 20  Wt 148 lb (67.132 kg)  BMI 24.25 kg/m2  SpO2 97%  Physical Exam  Vitals reviewed. Constitutional: She is oriented to person, place, and time. She appears well-developed and well-nourished.  HENT:  Right Ear: External ear normal.  Left Ear: External ear normal.  Mouth/Throat: No oropharyngeal exudate.  Eyes: Conjunctivae and EOM are normal. Pupils are equal, round, and reactive to light.  Neck: Normal range of motion. Neck supple.  Cardiovascular: Normal rate, regular rhythm, normal heart sounds and intact distal pulses.  Exam reveals no gallop and no friction rub.   No murmur heard. Pulmonary/Chest: Effort normal and breath sounds normal.  Abdominal: Soft. Normal appearance and bowel sounds are normal. She exhibits no distension. There is no hepatosplenomegaly. There is tenderness in the epigastric area. There is no rigidity, no rebound, no guarding and no CVA tenderness.  Musculoskeletal: Normal range of motion. She exhibits no edema.  Neurological: She is alert and oriented to person, place, and time. No cranial nerve deficit.  Skin: Skin is warm and dry. No rash noted.  Psychiatric: She has a normal mood and affect.    ED Course  Procedures (including critical care time)  Labs Reviewed  CBC WITH DIFFERENTIAL - Abnormal; Notable for the following:     Neutrophils Relative 79 (*)    All other components within normal limits  LIPASE, BLOOD - Abnormal; Notable for the following:    Lipase 89 (*)    All other components within normal limits  COMPREHENSIVE METABOLIC PANEL - Abnormal; Notable for the following:    Potassium 3.4 (*)    Glucose, Bld 109 (*)    GFR calc non Af Amer 79 (*)    All other components within normal limits  URINALYSIS, ROUTINE W REFLEX MICROSCOPIC - Abnormal; Notable for the following:    Color, Urine AMBER (*)    Hgb urine dipstick MODERATE (*)    Bilirubin Urine SMALL (*)    Ketones, ur 15 (*)    Leukocytes, UA TRACE (*)    All other components within normal limits  URINE MICROSCOPIC-ADD ON - Abnormal; Notable for the following:    Casts HYALINE CASTS (*)    All other components within normal limits   No results found.  Angiocath insertion Performed by: Oleh Genin Consent: Verbal consent obtained. Risks and benefits: risks, benefits and alternatives were discussed Time out: Immediately prior to procedure a "time out" was called to verify the correct patient, procedure, equipment, support staff and site/side marked as required. Preparation: Patient was prepped and draped in the usual sterile fashion. Vein Location: left forearm Ultrasound Guided Gauge: 18 Normal blood return and flush without difficulty Patient tolerance: Patient tolerated the procedure well with no immediate complications.    1. Pancreatitis, acute   2. Alcohol abuse       MDM   77 y F with PMH of depression, migraine, FM, CM, prior alcoholism and HTN here after referral from PCP for elevated lipase found on laboratory testing for epigastric abdominal pain.  Pain has been present for several days and is worsening.  She has a hx of alcoholism and recently relapsed, drinking a bottle of Vodka a few days ago.  +loose stools, nausea and mild dysuria on ROS.  No vomiting. She also received a Reclast infusion recently, as well.  AFVSS.   Mild epigastric discomfort with palpation.  No peritoneal signs.  No CVAT.  CBC, CMP, lipase, UA, morphine, zofran, NS bolus.  U/S IV placed by myself due to inability of nursing to obtain IV access.  1:07 PM Lipase only minimally elevated.  UA not c/w UTI - discomfort likely due to urine concentration.  Remainder of labs WNLs.  Abd re-examined and with no peritoneal signs, only minimal TTP in epigastric area.  Pt feels better.  Will give additional 4mg  Morphine prior to discharge (pt not driving). EtOH cessation counseling given.   Return precautions reviewed.  It is felt the pt is stable for d/c with close PCP f/u.  All questions answered and patient expressed understanding.  Disposition: Discharge  Condition: Good  Follow-up Information   Schedule an appointment as soon as possible for a visit with Neldon Labella, MD.   Contact information:   [address] [place] [address] [phone]         Medication List    TAKE these medications       HYDROcodone-acetaminophen 5-325 MG per tablet  Commonly known as:  NORCO/VICODIN  Take 1 tablet by mouth every 4 (four) hours as needed for pain.     ondansetron 4 MG tablet  Commonly known as:  ZOFRAN  Take 1 tablet (4 mg total) by mouth every 8 (eight) hours as needed for nausea.        Pt seen in conjunction with my attending, Dr. Silverio Lay.   Oleh Genin, MD PGY-II  [facility] Resident      Oleh Genin, MD 08/31/12 1311

## 2012-08-31 NOTE — ED Notes (Signed)
Pt reports sudden onset of epigastric pain beginning last night. Seen by PMD this AM and sent over due to elevated pancreatic enzymes. Denies nausea, vomiting, fever. C/o bodyaches beginning this AM. Pt reports having gotten a reclast shot two days ago as well. Pt HOH. Will page interpreter.

## 2012-08-31 NOTE — ED Notes (Signed)
Pt d/c'd from IV, continuous pulse oximetry and blood pressure cuff; pt getting dressed to be discharged home; family at bedside 

## 2012-08-31 NOTE — ED Provider Notes (Addendum)
I have supervised the resident on the management of this patient and agree with the note above. I personally interviewed and examined the patient and my addendum is below.   [name] is a 52 y.o. female hx of HTN, occasional alcohol use here with epigastric pain. Pain since yesterday, sent in by PMD because of slightly elevated lipase. Tolerating PO at home and recently had a binge of alcohol several days ago. Lipase mildly elevated, less than double of the upper limit of normal. Patient felt better after fluids and zofran and morphine. Tolerated PO. I think she may have elevated lipase from alcohol use. I recommend advance diet as tolerated at home. She should not drink alcohol. Return precautions given.    Angiocath placed on by resident.   Richardean Canal, MD 08/31/12 1324  Richardean Canal, MD 09/19/12 1055  Richardean Canal, MD 09/19/12 1056

## 2012-09-04 DIAGNOSIS — E785 Hyperlipidemia, unspecified: Secondary | ICD-10-CM | POA: Diagnosis not present

## 2012-09-04 DIAGNOSIS — Z79899 Other long term (current) drug therapy: Secondary | ICD-10-CM | POA: Diagnosis not present

## 2012-09-05 ENCOUNTER — Encounter: Payer: Self-pay | Admitting: *Deleted

## 2012-09-05 NOTE — Progress Notes (Signed)
Patient ID: [name], female   DOB: [birth year], 52 y.o.   MRN: [id number] There must have been a lag in communication from the third party company that takes care of Forteo. We had orginally received a denial on Forteo. I sent it back due to GERD and unable to take Biphophonates. I received a second denial thinking it was from appeal. I set pt up for Reclast per further instructions from Dr Lily Peer and she received it on 08/29/12. I receieved a fax from [employer] yesterday stating they were still needing contact with the patient and that her copay would only be $3.60 a month for Forteo. I called [employer] today and discontinued this since the patient received Reclast. KW

## 2012-09-06 DIAGNOSIS — Z954 Presence of other heart-valve replacement: Secondary | ICD-10-CM | POA: Diagnosis not present

## 2012-09-06 DIAGNOSIS — Q2111 Secundum atrial septal defect: Secondary | ICD-10-CM | POA: Diagnosis not present

## 2012-09-06 DIAGNOSIS — Q211 Atrial septal defect: Secondary | ICD-10-CM | POA: Diagnosis not present

## 2012-09-06 DIAGNOSIS — E7800 Pure hypercholesterolemia, unspecified: Secondary | ICD-10-CM | POA: Diagnosis not present

## 2012-09-13 DIAGNOSIS — M545 Low back pain, unspecified: Secondary | ICD-10-CM | POA: Diagnosis not present

## 2012-09-13 DIAGNOSIS — IMO0002 Reserved for concepts with insufficient information to code with codable children: Secondary | ICD-10-CM | POA: Diagnosis not present

## 2012-10-04 ENCOUNTER — Ambulatory Visit (HOSPITAL_COMMUNITY): Payer: Medicare Other | Admitting: Physician Assistant

## 2012-10-04 DIAGNOSIS — M545 Low back pain, unspecified: Secondary | ICD-10-CM | POA: Diagnosis not present

## 2012-10-04 DIAGNOSIS — D179 Benign lipomatous neoplasm, unspecified: Secondary | ICD-10-CM | POA: Diagnosis not present

## 2012-10-04 DIAGNOSIS — M542 Cervicalgia: Secondary | ICD-10-CM | POA: Diagnosis not present

## 2012-10-11 DIAGNOSIS — M545 Low back pain, unspecified: Secondary | ICD-10-CM | POA: Diagnosis not present

## 2012-10-11 DIAGNOSIS — IMO0002 Reserved for concepts with insufficient information to code with codable children: Secondary | ICD-10-CM | POA: Diagnosis not present

## 2012-10-22 DIAGNOSIS — F319 Bipolar disorder, unspecified: Secondary | ICD-10-CM | POA: Diagnosis not present

## 2012-10-22 DIAGNOSIS — F411 Generalized anxiety disorder: Secondary | ICD-10-CM | POA: Diagnosis not present

## 2012-10-26 DIAGNOSIS — H53039 Strabismic amblyopia, unspecified eye: Secondary | ICD-10-CM | POA: Diagnosis not present

## 2012-10-31 DIAGNOSIS — F319 Bipolar disorder, unspecified: Secondary | ICD-10-CM | POA: Diagnosis not present

## 2012-10-31 DIAGNOSIS — F411 Generalized anxiety disorder: Secondary | ICD-10-CM | POA: Diagnosis not present

## 2012-11-06 ENCOUNTER — Ambulatory Visit (INDEPENDENT_AMBULATORY_CARE_PROVIDER_SITE_OTHER): Payer: Medicare Other | Admitting: Physician Assistant

## 2012-11-06 VITALS — BP 116/86 | HR 90 | Ht 65.5 in | Wt 143.0 lb

## 2012-11-06 DIAGNOSIS — F1020 Alcohol dependence, uncomplicated: Secondary | ICD-10-CM

## 2012-11-06 DIAGNOSIS — F1021 Alcohol dependence, in remission: Secondary | ICD-10-CM

## 2012-11-06 DIAGNOSIS — F331 Major depressive disorder, recurrent, moderate: Secondary | ICD-10-CM

## 2012-11-06 DIAGNOSIS — F332 Major depressive disorder, recurrent severe without psychotic features: Secondary | ICD-10-CM

## 2012-11-06 DIAGNOSIS — F411 Generalized anxiety disorder: Secondary | ICD-10-CM

## 2012-11-06 MED ORDER — BUPROPION HCL ER (XL) 450 MG PO TB24: 450.0000 mg | ORAL_TABLET | Freq: Every morning | ORAL | Status: DC

## 2012-11-06 MED ORDER — DESVENLAFAXINE SUCCINATE ER 100 MG PO TB24: 100.0000 mg | ORAL_TABLET | Freq: Every morning | ORAL | Status: DC

## 2012-11-06 MED ORDER — ARIPIPRAZOLE 10 MG PO TABS: 10.0000 mg | ORAL_TABLET | Freq: Every morning | ORAL | Status: DC

## 2012-11-07 ENCOUNTER — Encounter (HOSPITAL_COMMUNITY): Payer: Self-pay | Admitting: Physician Assistant

## 2012-11-07 DIAGNOSIS — F10230 Alcohol dependence with withdrawal, uncomplicated: Secondary | ICD-10-CM | POA: Insufficient documentation

## 2012-11-07 NOTE — Progress Notes (Signed)
[facility] [address] Progress Note  [name] [id number] 52 y.o.  6/302014 4:27 PM  Chief Complaint: Alcohol dependence, anxiety, depression  History of Present Illness: [name] presents today to followup on her treatment for anxiety and depression. When asked how she's doing, she states "I'm doing well now." She admits that over the past 4 years she has never had any period of sobriety, and that she had been drinking alcohol regularly and lying about her behavior to everyone. She reports that she was confronted by her ex-husband who saw her purchasing alcohol, and to told her daughter that she was drinking. This was upsetting enough to [name] that she has recommitted herself to recovery. She reports that she has been sober for 2 months now. She has gotten a new sponsor, and she is attending AA meetings daily. She also reports that she is now taking her medications as prescribed, and admits that she had not been taking them regularly in the past. She feels that now she is taking her medications appropriately, she feels much better emotionally.  Suicidal Ideation: No Plan Formed: No Patient has means to carry out plan: No  Homicidal Ideation: No Plan Formed: No Patient has means to carry out plan: No  Review of Systems: Psychiatric: Agitation: No Hallucination: No Depressed Mood: Yes Insomnia: No Hypersomnia: No Altered Concentration: No Feels Worthless: Yes Grandiose Ideas: No Belief In Special Powers: No New/Increased Substance Abuse: Yes Compulsions: No  Neurologic: Headache: No Seizure: No Paresthesias: No  Past Medical History: Hearing impairment, migraine headache, arthritis, cardiomyopathy, history of spinal compression fracture, menopause, psoriasis, vitamin D deficiency  Outpatient Encounter Prescriptions as of 11/06/2012  Medication Sig Dispense Refill  . ARIPiprazole (ABILIFY) 10 MG tablet Take 1 tablet (10 mg total) by mouth every morning.  30 tablet  2   . aspirin EC 81 MG tablet Take 81 mg by mouth every morning.      . BuPROPion HCl ER, XL, (FORFIVO XL) 450 MG TB24 Take 450 mg by mouth every morning.  30 tablet  2  . Calcium Carbonate-Vitamin D (CALCIUM 600 + D PO) Take 1 tablet by mouth 2 (two) times daily.      . carvedilol (COREG) 25 MG tablet Take 25 mg by mouth 2 (two) times daily with a meal.      . desvenlafaxine (PRISTIQ) 100 MG 24 hr tablet Take 1 tablet (100 mg total) by mouth every morning.  30 tablet  2  . fish oil-omega-3 fatty acids 1000 MG capsule Take 1 g by mouth 2 (two) times daily.       . Flaxseed, Linseed, (FLAXSEED OIL) 1000 MG CAPS Take 1,000 mg by mouth 2 (two) times daily.      . hydrochlorothiazide (MICROZIDE) 12.5 MG capsule Take 12.5 mg by mouth every morning.       [name] HYDROcodone-acetaminophen (NORCO/VICODIN) 5-325 MG per tablet Take 1 tablet by mouth every 4 (four) hours as needed for pain.  20 tablet  0  . Multiple Vitamin (MULTIVITAMIN) tablet Take 1 tablet by mouth every morning.       [name] omeprazole (PRILOSEC) 20 MG capsule Take 20 mg by mouth daily.      . ondansetron (ZOFRAN) 4 MG tablet Take 1 tablet (4 mg total) by mouth every 8 (eight) hours as needed for nausea.  15 tablet  0  . zoledronic acid (RECLAST) 5 MG/100ML SOLN Inject 5 mg into the vein once.      . [DISCONTINUED] ARIPiprazole (ABILIFY)  10 MG tablet Take 1 tablet (10 mg total) by mouth every morning.  30 tablet  1  . [DISCONTINUED] BuPROPion HCl ER, XL, (FORFIVO XL) 450 MG TB24 Take 450 mg by mouth every morning.  30 tablet  1  . [DISCONTINUED] desvenlafaxine (PRISTIQ) 100 MG 24 hr tablet Take 1 tablet (100 mg total) by mouth every morning.  30 tablet  1   No facility-administered encounter medications on file as of 11/06/2012.    Past Psychiatric History/Hospitalization(s): Anxiety: Yes Bipolar Disorder: No Depression: Yes Mania: No Psychosis: No Schizophrenia: No Personality Disorder: No Hospitalization for psychiatric illness:  Yes History of Electroconvulsive Shock Therapy: No Prior Suicide Attempts: No  Physical Exam: Constitutional:  BP 116/86  Pulse 90  Ht 5' 5.5" (1.664 m)  Wt 143 lb (64.864 kg)  BMI 23.43 kg/m2  General Appearance: alert, oriented, no acute distress, well nourished and casual  Musculoskeletal: Strength & Muscle Tone: within normal limits Gait & Station: normal Patient leans: N/A  Psychiatric: Speech (describe rate, volume, coherence, spontaneity, and abnormalities if any): Clear and coherent with a mild speech impediment at a regular rate and rhythm and normal volume  Thought Process (describe rate, content, abstract reasoning, and computation): Within normal limits  Associations: Intact  Thoughts: normal  Mental Status: Orientation: oriented to person, place, time/date and situation Mood & Affect: Anxious and dysphoric with a constricted affect Attention Span & Concentration: Intact  Medical Decision Making (Choose Three): Review of Psycho-Social Stressors (1), Established Problem, Worsening (2) and Review of Medication Regimen & Side Effects (2)  Assessment: Axis I: Alcohol dependence in recent remission; major depressive disorder, recurrent, moderate; generalized anxiety disorder  Axis II: Deferred  Axis III: Hearing impairment, migraine headache, arthritis, cardiomyopathy, history of spinal compression fracture, menopause, psoriasis, vitamin D deficiency  Axis IV: Moderate  Axis V: 60   Plan: We will continue her Abilify 10 mg daily, Wellbutrin XL 450 mg daily, and pristine 100 mg daily. She is expected to attend daily AA meetings , and work with her sponsor. She also will continue seeing her outpatient therapist, [name]. She will return for followup at my next available appointment in approximately 2 months. She is encouraged to call between appointments if necessary  Grace Landstrom, PA-C 11/07/2012

## 2012-11-08 DIAGNOSIS — IMO0002 Reserved for concepts with insufficient information to code with codable children: Secondary | ICD-10-CM | POA: Diagnosis not present

## 2012-12-04 DIAGNOSIS — F411 Generalized anxiety disorder: Secondary | ICD-10-CM | POA: Diagnosis not present

## 2012-12-04 DIAGNOSIS — F319 Bipolar disorder, unspecified: Secondary | ICD-10-CM | POA: Diagnosis not present

## 2012-12-18 DIAGNOSIS — F411 Generalized anxiety disorder: Secondary | ICD-10-CM | POA: Diagnosis not present

## 2012-12-18 DIAGNOSIS — F319 Bipolar disorder, unspecified: Secondary | ICD-10-CM | POA: Diagnosis not present

## 2013-01-01 DIAGNOSIS — F411 Generalized anxiety disorder: Secondary | ICD-10-CM | POA: Diagnosis not present

## 2013-01-01 DIAGNOSIS — F319 Bipolar disorder, unspecified: Secondary | ICD-10-CM | POA: Diagnosis not present

## 2013-01-02 ENCOUNTER — Encounter (HOSPITAL_COMMUNITY): Payer: Self-pay | Admitting: *Deleted

## 2013-01-02 ENCOUNTER — Emergency Department (HOSPITAL_COMMUNITY)
Admission: EM | Admit: 2013-01-02 | Discharge: 2013-01-02 | Disposition: A | Discharge status: Home / Self Care | Payer: Medicare Other | Attending: Emergency Medicine | Admitting: Emergency Medicine

## 2013-01-02 DIAGNOSIS — G43909 Migraine, unspecified, not intractable, without status migrainosus: Secondary | ICD-10-CM | POA: Diagnosis not present

## 2013-01-02 DIAGNOSIS — IMO0001 Reserved for inherently not codable concepts without codable children: Secondary | ICD-10-CM | POA: Diagnosis not present

## 2013-01-02 DIAGNOSIS — Z86711 Personal history of pulmonary embolism: Secondary | ICD-10-CM | POA: Diagnosis not present

## 2013-01-02 DIAGNOSIS — I10 Essential (primary) hypertension: Secondary | ICD-10-CM | POA: Diagnosis not present

## 2013-01-02 DIAGNOSIS — Z8719 Personal history of other diseases of the digestive system: Secondary | ICD-10-CM | POA: Insufficient documentation

## 2013-01-02 DIAGNOSIS — Z8669 Personal history of other diseases of the nervous system and sense organs: Secondary | ICD-10-CM | POA: Insufficient documentation

## 2013-01-02 DIAGNOSIS — Z79899 Other long term (current) drug therapy: Secondary | ICD-10-CM | POA: Diagnosis not present

## 2013-01-02 DIAGNOSIS — R112 Nausea with vomiting, unspecified: Secondary | ICD-10-CM | POA: Insufficient documentation

## 2013-01-02 DIAGNOSIS — H53149 Visual discomfort, unspecified: Secondary | ICD-10-CM | POA: Insufficient documentation

## 2013-01-02 DIAGNOSIS — F3289 Other specified depressive episodes: Secondary | ICD-10-CM | POA: Insufficient documentation

## 2013-01-02 DIAGNOSIS — F329 Major depressive disorder, single episode, unspecified: Secondary | ICD-10-CM | POA: Insufficient documentation

## 2013-01-02 DIAGNOSIS — Z8679 Personal history of other diseases of the circulatory system: Secondary | ICD-10-CM | POA: Diagnosis not present

## 2013-01-02 DIAGNOSIS — M129 Arthropathy, unspecified: Secondary | ICD-10-CM | POA: Insufficient documentation

## 2013-01-02 MED ORDER — SODIUM CHLORIDE 0.9 % IV BOLUS (SEPSIS): 500.0000 mL | Freq: Once | INTRAVENOUS | Status: DC

## 2013-01-02 MED ORDER — SODIUM CHLORIDE 0.9 % IV BOLUS (SEPSIS)
500.0000 mL | Freq: Once | INTRAVENOUS | Status: AC
  Administered 2013-01-02: 500 mL via INTRAVENOUS

## 2013-01-02 MED ORDER — METOCLOPRAMIDE HCL 5 MG/ML IJ SOLN
10.0000 mg | Freq: Once | INTRAMUSCULAR | Status: AC
  Administered 2013-01-02: 10 mg via INTRAVENOUS
  Filled 2013-01-02: qty 2

## 2013-01-02 MED ORDER — KETOROLAC TROMETHAMINE 30 MG/ML IJ SOLN
30.0000 mg | Freq: Once | INTRAMUSCULAR | Status: AC
  Administered 2013-01-02: 30 mg via INTRAVENOUS
  Filled 2013-01-02: qty 1

## 2013-01-02 MED ORDER — DIPHENHYDRAMINE HCL 50 MG/ML IJ SOLN
25.0000 mg | Freq: Once | INTRAMUSCULAR | Status: AC
  Administered 2013-01-02: 25 mg via INTRAVENOUS
  Filled 2013-01-02: qty 1

## 2013-01-02 MED ORDER — BUTALBITAL-APAP-CAFFEINE 50-325-40 MG PO TABS
1.0000 | ORAL_TABLET | Freq: Once | ORAL | Status: AC
  Administered 2013-01-02: 1 via ORAL
  Filled 2013-01-02: qty 1

## 2013-01-02 MED ORDER — DEXAMETHASONE SODIUM PHOSPHATE 10 MG/ML IJ SOLN
10.0000 mg | Freq: Once | INTRAMUSCULAR | Status: AC
  Administered 2013-01-02: 10 mg via INTRAVENOUS
  Filled 2013-01-02: qty 1

## 2013-01-02 NOTE — ED Provider Notes (Signed)
CSN: [id number]     Arrival date & time 01/02/13  1103 History   First MD Initiated Contact with Patient 01/02/13 1202     Chief Complaint  Patient presents with  . Migraine   (Consider location/radiation/quality/duration/timing/severity/associated sxs/prior Treatment) HPI Comments: 52 year old female with a past medical history of migraines, fibromyalgia, depression, cardiomyopathy, hearing impairment and hypertension presents to the emergency department complaining of a headache since she woke up at 6:30 this morning. States the headache is the same as her "typical migraines". Tried taking Fioricet without relief. She was previously on prophylactic medication, however has not been on this for a little while as she did not like the way it made her feel. Admits to associated nausea with one episode of vomiting along with photophobia which are "normal" for her migraines. Denies visual disturbance. Denies fever or chills. No neck pain.  Patient is a 52 y.o. female presenting with migraines. The history is provided by the patient.  Migraine Associated symptoms include headaches, nausea and vomiting. Pertinent negatives include no chills, fever or neck pain.    Past Medical History  Diagnosis Date  . Depression   . Migraine   . Fibromyalgia   . Hearing impairment   . Arthritis   . Cardiomyopathy   . Pulmonary embolism   . Gastritis   . Hypertension    Past Surgical History  Procedure Laterality Date  . Appendectomy    . Inner ear surgery      TUBES  . Tonsillectomy and adenoidectomy    . Tubal ligation    . Laparoscopic cholecystectomy  2012  . Cardiac surgery  January 2007    mitral valve repair  . Cardiac surgery  1973    atrial septal defect   Family History  Problem Relation Age of Onset  . Cancer Mother   . Breast cancer Mother   . Depression Sister   . Heart disease Father   . Hypertension Father    History  Substance Use Topics  . Smoking status: Never Smoker   .  Smokeless tobacco: Not on file  . Alcohol Use: No   OB History   Grav Para Term Preterm Abortions TAB SAB Ect Mult Living   2 2 2       2      Review of Systems  Constitutional: Negative for fever and chills.  HENT: Negative for neck pain.   Eyes: Positive for photophobia. Negative for visual disturbance.  Gastrointestinal: Positive for nausea and vomiting.  Neurological: Positive for headaches. Negative for dizziness, syncope and light-headedness.  Psychiatric/Behavioral: Negative for confusion.  All other systems reviewed and are negative.    Allergies  Depakote and Valium  Home Medications   Current Outpatient Rx  Name  Route  Sig  Dispense  Refill  . ARIPiprazole (ABILIFY) 10 MG tablet   Oral   Take 1 tablet (10 mg total) by mouth every morning.   30 tablet   2   . aspirin EC 81 MG tablet   Oral   Take 81 mg by mouth every morning.         . BuPROPion HCl ER, XL, (FORFIVO XL) 450 MG TB24   Oral   Take 450 mg by mouth every morning.   30 tablet   2   . Calcium Carbonate-Vitamin D (CALCIUM 600 + D PO)   Oral   Take 1 tablet by mouth 2 (two) times daily.         . carvedilol (COREG)  25 MG tablet   Oral   Take 25 mg by mouth 2 (two) times daily with a meal.         . desvenlafaxine (PRISTIQ) 100 MG 24 hr tablet   Oral   Take 1 tablet (100 mg total) by mouth every morning.   30 tablet   2   . fish oil-omega-3 fatty acids 1000 MG capsule   Oral   Take 1 g by mouth 2 (two) times daily.          . Flaxseed, Linseed, (FLAXSEED OIL) 1000 MG CAPS   Oral   Take 1,000 mg by mouth 2 (two) times daily.         . hydrochlorothiazide (MICROZIDE) 12.5 MG capsule   Oral   Take 12.5 mg by mouth every morning.          [name] HYDROcodone-acetaminophen (NORCO/VICODIN) 5-325 MG per tablet   Oral   Take 1 tablet by mouth every 4 (four) hours as needed for pain.   20 tablet   0   . Multiple Vitamin (MULTIVITAMIN) tablet   Oral   Take 1 tablet by mouth  every morning.          . ondansetron (ZOFRAN) 4 MG tablet   Oral   Take 1 tablet (4 mg total) by mouth every 8 (eight) hours as needed for nausea.   15 tablet   0   . zoledronic acid (RECLAST) 5 MG/100ML SOLN   Intravenous   Inject 5 mg into the vein once.          BP 131/87  Pulse 91  Temp(Src) 98.1 F (36.7 C) (Oral)  Resp 18  SpO2 99% Physical Exam  Nursing note and vitals reviewed. Constitutional: She is oriented to person, place, and time. She appears well-developed and well-nourished. No distress.  HENT:  Head: Normocephalic and atraumatic.  Mouth/Throat: Oropharynx is clear and moist.  Eyes: Conjunctivae are normal.  Neck: Normal range of motion. Neck supple.  Cardiovascular: Normal rate, regular rhythm and normal heart sounds.   Pulmonary/Chest: Effort normal and breath sounds normal.  Abdominal: Soft. Bowel sounds are normal. There is no tenderness.  Musculoskeletal: Normal range of motion. She exhibits no edema.  Neurological: She is alert and oriented to person, place, and time. She has normal strength. No cranial nerve deficit or sensory deficit. She displays a negative Romberg sign. Coordination normal.  Skin: Skin is warm and dry. She is not diaphoretic.  Psychiatric: She has a normal mood and affect. Her behavior is normal.    ED Course  Procedures (including critical care time) Labs Review Labs Reviewed - No data to display Imaging Review No results found.  MDM   1. Migraine headache     Patient with migraine headache, "typical" migraine for her unrelieved by Fioricet at home. No red flags concerning patient's headache. Afebrile, NAD, normal vial signs, unremarkable neurologic exam. Will give fluids, toradol, reglan, benadryl, decadron and re-assess. 1:05 PM Headache 8/10 from 10/10. Will continue fluids, give fioricet and re-assess. 1:53 PM Headache 5/10 after fioricet. She is resting comfortably on exam bed. Stable for discharge home. She  will f/u with PCP. Return precautions discussed. Patient states understanding of treatment care plan and is agreeable.   Trevor Mace, PA-C 01/02/13 1353

## 2013-01-02 NOTE — ED Notes (Signed)
Pt is here with migraine that she woke up with at 0630.  Pt is treated for migraines but the medication is not working..  Nauseated, photosensitive.

## 2013-01-04 NOTE — ED Provider Notes (Signed)
Medical screening examination/treatment/procedure(s) were performed by non-physician practitioner and as supervising physician I was immediately available for consultation/collaboration.  Lugene Hitt, MD 01/04/13 0848 

## 2013-01-13 DIAGNOSIS — G43909 Migraine, unspecified, not intractable, without status migrainosus: Secondary | ICD-10-CM | POA: Diagnosis not present

## 2013-01-13 DIAGNOSIS — J209 Acute bronchitis, unspecified: Secondary | ICD-10-CM | POA: Diagnosis not present

## 2013-02-06 ENCOUNTER — Encounter (HOSPITAL_COMMUNITY): Payer: Self-pay | Admitting: Physician Assistant

## 2013-02-06 ENCOUNTER — Ambulatory Visit (INDEPENDENT_AMBULATORY_CARE_PROVIDER_SITE_OTHER): Payer: Medicare Other | Admitting: Physician Assistant

## 2013-02-06 VITALS — BP 108/78 | HR 90 | Ht 65.5 in | Wt 143.2 lb

## 2013-02-06 DIAGNOSIS — F1021 Alcohol dependence, in remission: Secondary | ICD-10-CM

## 2013-02-06 DIAGNOSIS — F411 Generalized anxiety disorder: Secondary | ICD-10-CM

## 2013-02-06 DIAGNOSIS — F332 Major depressive disorder, recurrent severe without psychotic features: Secondary | ICD-10-CM

## 2013-02-06 DIAGNOSIS — F331 Major depressive disorder, recurrent, moderate: Secondary | ICD-10-CM

## 2013-02-06 DIAGNOSIS — F1020 Alcohol dependence, uncomplicated: Secondary | ICD-10-CM

## 2013-02-06 NOTE — Progress Notes (Signed)
[facility] [address] Progress Note  [name] [id number] 51 y.o.  02/06/2013 1:48 PM  Chief Complaint: Alcohol and depression  History of Present Illness: [name] presents today to followup on her treatment for depression, anxiety, and alcohol dependence. She admits that she relapsed once again one month ago. She plans to pick up a 30 day chip in an AA meeting tomorrow. She described a pattern of being able to pass her urine drug screens while in IOP a few years ago. She reports that her son, who is away at college, admitted to her that he had been self-mutilation by burning, and blames her. He threatened to leave college and [employer], home and take care of his younger sister if [name] was unable to stay sober. She does endorse that she is having some depression recently, but that seems to be related to relationship problems. She had gotten into her relationship on the Internet with a man who was married, and his wife is discovering the relationship, and he has not been able to get in touch with her. Her girlfriend from high school set her up with a guy and they went on a double date. She has entered into a sexual, but nonemotional relationship with that gentleman. She continues to see her therapist Evelena Peat every 2 weeks.  Suicidal Ideation: No Plan Formed: No Patient has means to carry out plan: No  Homicidal Ideation: No Plan Formed: No Patient has means to carry out plan: No  Review of Systems: Psychiatric: Agitation: No Hallucination: No Depressed Mood: Yes Insomnia: No Hypersomnia: No Altered Concentration: No Feels Worthless: Yes Grandiose Ideas: No Belief In Special Powers: No New/Increased Substance Abuse: No Compulsions: No  Neurologic: Headache: No Seizure: No Paresthesias: No  Past Medical History: Hearing impairment, migraine headache, arthritis, cardiomyopathy, history of spinal compression fracture, menopause, psoriasis, vitamin D  deficiency   Outpatient Encounter Prescriptions as of 02/06/2013  Medication Sig Dispense Refill  . ARIPiprazole (ABILIFY) 10 MG tablet Take 1 tablet (10 mg total) by mouth every morning.  30 tablet  2  . aspirin EC 81 MG tablet Take 81 mg by mouth every morning.      . BuPROPion HCl ER, XL, (FORFIVO XL) 450 MG TB24 Take 450 mg by mouth every morning.  30 tablet  2  . Butalbital-APAP-Caffeine (FIORICET PO) Take 1 tablet by mouth daily as needed (MIGRAINE).      . Calcium Carbonate-Vitamin D (CALCIUM 600 + D PO) Take 1 tablet by mouth 2 (two) times daily.      . carvedilol (COREG) 25 MG tablet Take 25 mg by mouth 2 (two) times daily with a meal.      . desvenlafaxine (PRISTIQ) 100 MG 24 hr tablet Take 1 tablet (100 mg total) by mouth every morning.  30 tablet  2  . fish oil-omega-3 fatty acids 1000 MG capsule Take 1 g by mouth 2 (two) times daily.       . Flaxseed, Linseed, (FLAXSEED OIL) 1000 MG CAPS Take 1,000 mg by mouth 2 (two) times daily.      . hydrochlorothiazide (MICROZIDE) 12.5 MG capsule Take 12.5 mg by mouth every morning.       [name] HYDROcodone-acetaminophen (NORCO/VICODIN) 5-325 MG per tablet Take 1 tablet by mouth every 4 (four) hours as needed for pain.  20 tablet  0  . Multiple Vitamin (MULTIVITAMIN) tablet Take 1 tablet by mouth every morning.        No facility-administered encounter medications on file  as of 02/06/2013.    Past Psychiatric History/Hospitalization(s): Anxiety: Yes Bipolar Disorder: Yes Depression: Yes Mania: Yes Psychosis: No Schizophrenia: No Personality Disorder: No Hospitalization for psychiatric illness: Yes History of Electroconvulsive Shock Therapy: No Prior Suicide Attempts: No  Physical Exam: Constitutional:  BP 108/78  Pulse 90  Ht 5' 5.5" (1.664 m)  Wt 143 lb 3.2 oz (64.955 kg)  BMI 23.46 kg/m2  General Appearance: alert, oriented, no acute distress, well nourished and casually dressed  Musculoskeletal: Strength & Muscle Tone: within  normal limits Gait & Station: normal Patient leans: N/A  Psychiatric: Speech (describe rate, volume, coherence, spontaneity, and abnormalities if any): Clear and coherent at a regular rate and rhythm and normal volume  Thought Process (describe rate, content, abstract reasoning, and computation): Within normal limits  Associations: Intact  Thoughts: normal  Mental Status: Orientation: oriented to person, place, time/date and situation Mood & Affect: anxiety Attention Span & Concentration: Intact  Medical Decision Making (Choose Three): Established Problem, Stable/Improving (1), Review of Psycho-Social Stressors (1) and Review of Medication Regimen & Side Effects (2)  Assessment:  Axis I: Alcohol dependence in recent remission; major depressive disorder, recurrent, moderate; generalized anxiety disorder  Axis II: Deferred  Axis III: Hearing impairment, migraine headache, arthritis, cardiomyopathy, history of spinal compression fracture, menopause, psoriasis, vitamin D deficiency  Axis IV: Moderate  Axis V: 60   Plan: We will continue her Abilify 10 mg daily, Wellbutrin XL 450 mg daily, and pristine 100 mg daily. She will continue seeing her outpatient therapist, Evelena Peat. She will return for followup at my next available appointment in approximately 2 months. She is encouraged to call between appointments if necessary   Ami Thornsberry, PA-C 02/06/2013

## 2013-02-26 ENCOUNTER — Other Ambulatory Visit (HOSPITAL_COMMUNITY)
Admission: RE | Admit: 2013-02-26 | Discharge: 2013-02-26 | Disposition: A | Discharge status: Home / Self Care | Payer: Medicare Other | Source: Ambulatory Visit | Attending: Gynecology | Admitting: Gynecology

## 2013-02-26 ENCOUNTER — Encounter: Payer: Self-pay | Admitting: Gynecology

## 2013-02-26 ENCOUNTER — Ambulatory Visit (INDEPENDENT_AMBULATORY_CARE_PROVIDER_SITE_OTHER): Payer: Medicare Other | Admitting: Gynecology

## 2013-02-26 VITALS — BP 132/88

## 2013-02-26 DIAGNOSIS — Z01419 Encounter for gynecological examination (general) (routine) without abnormal findings: Secondary | ICD-10-CM | POA: Insufficient documentation

## 2013-02-26 DIAGNOSIS — Z23 Encounter for immunization: Secondary | ICD-10-CM

## 2013-02-26 DIAGNOSIS — N950 Postmenopausal bleeding: Secondary | ICD-10-CM

## 2013-02-26 DIAGNOSIS — Z1151 Encounter for screening for human papillomavirus (HPV): Secondary | ICD-10-CM | POA: Insufficient documentation

## 2013-02-26 DIAGNOSIS — Z124 Encounter for screening for malignant neoplasm of cervix: Secondary | ICD-10-CM | POA: Diagnosis not present

## 2013-02-26 DIAGNOSIS — N926 Irregular menstruation, unspecified: Secondary | ICD-10-CM | POA: Diagnosis not present

## 2013-02-26 DIAGNOSIS — Z86718 Personal history of other venous thrombosis and embolism: Secondary | ICD-10-CM | POA: Insufficient documentation

## 2013-02-26 HISTORY — DX: Postmenopausal bleeding: N95.0

## 2013-02-26 LAB — CBC WITH DIFFERENTIAL/PLATELET
Basophils Relative: 1 % (ref 0–1)
Eosinophils Absolute: 0.1 10*3/uL (ref 0.0–0.7)
Lymphs Abs: 0.7 10*3/uL (ref 0.7–4.0)
MCH: 32.1 pg (ref 26.0–34.0)
Neutro Abs: 2.8 10*3/uL (ref 1.7–7.7)
Neutrophils Relative %: 69 % (ref 43–77)
Platelets: 267 10*3/uL (ref 150–400)
RBC: 4.36 MIL/uL (ref 3.87–5.11)

## 2013-02-26 MED ORDER — MEGESTROL ACETATE 40 MG PO TABS: 40.0000 mg | ORAL_TABLET | Freq: Two times a day (BID) | ORAL | Status: DC

## 2013-02-26 NOTE — Progress Notes (Signed)
52 year old patient who presented to the office stating that she has not had a menstrual cycle in 5 years when she entered menopause. She is on no hormone replacement therapy. She stated that this past Friday when she wiped she noted blood on tissue paper. She had had intercourse the week prior. She stated that on Saturday she bled and did have intercourse and then Sunday bled in today with much less. Patient's last Pap smear was in 2012. Patient did have history of DVT at time of her pregnancy in 2003 and was anticoagulated for several months thereafter.  Exam: Bartholin urethra Skene was within normal limits Vagina: Some blood was noted in the vaginal vault Cervix: No lesions or discharge Bimanual exam uterus anteverted normal size shape and consistency adnexa with no masses or tenderness Rectal exam deferred  Pap smear was done before the bimanual exam. Patient was counseled for endometrial biopsy. The cervix was cleansed with Betadine solution. A single-tooth tenaculum was placed on the anterior cervical lip. Due to stenotic cervical os cervical dilatation with wire dilators were required in an effort to introduce the Pipelle into the intrauterine cavity. Tissue was submitted for histological evaluation.  Assessment/plan: Postmenopausal bleeding on no hormone replacement therapy. We'll check patient's CBC today. Endometrial biopsy results pending at time of this dictation. Patient returned to the office in 7-10 days for a sonohysterogram to complete the evaluation to rule out any intracavitary polyp or submucous myoma contributing to irregular bleeding. Pap smear result pending at time of this dictation. Patient will be placed on Megace 40 mg twice a day for 10 days. The risks benefits and pros and cons of this medication were discussed with the patient. All questions were answered we'll follow accordingly.

## 2013-02-26 NOTE — Patient Instructions (Signed)
Transvaginal Ultrasound Transvaginal ultrasound is a pelvic ultrasound, using a metal probe that is placed in the vagina, to look at a women's female organs. Transvaginal ultrasound is a method of seeing inside the pelvis of a woman. The ultrasound machine sends out sound waves from the transducer (probe). These sound waves bounce off body structures (like an echo) to create a picture. The picture shows up on a monitor. It is called transvaginal because the probe is inserted into the vagina. There should be very little discomfort from the vaginal probe. This test can also be used during pregnancy. Endovaginal ultrasound is another name for a transvaginal ultrasound. In a transabdominal ultrasound, the probe is placed on the outside of the belly. This method gives pictures that are lower quality than pictures from the transvaginal technique. Transvaginal ultrasound is used to look for problems of the female genital tract. Some such problems include:  Infertility problems.  Congenital (birth defect) malformations of the uterus and ovaries.  Tumors in the uterus.  Abnormal bleeding.  Ovarian tumors and cysts.  Abscess (inflamed tissue around pus) in the pelvis.  Unexplained abdominal or pelvic pain.  Pelvic infection. DURING PREGNANCY, TRANSVAGINAL ULTRASOUND MAY BE USED TO LOOK AT:  Normal pregnancy.  Ectopic pregnancy (pregnancy outside the uterus).  Fetal heartbeat.  Abnormalities in the pelvis, that are not seen well with transabdominal ultrasound.  Suspected twins or multiples.  Impending miscarriage.  Problems with the cervix (incompetent cervix, not able to stay closed and hold the baby).  When doing an amniocentesis (removing fluid from the pregnancy sac, for testing).  Looking for abnormalities of the baby.  Checking the growth, development, and age of the fetus.  Measuring the amount of fluid in the amniotic sac.  When doing an external version of the baby (moving  baby into correct position).  Evaluating the baby for problems in high risk pregnancies (biophysical profile).  Suspected fetal demise (death). Sometimes a special ultrasound method called Saline Infusion Sonography (SIS) is used for a more accurate look at the uterus. Sterile saline (salt water) is injected into the uterus of non-pregnant patients to see the inside of the uterus better. SIS is not used on pregnant women. The vaginal probe can also assist in obtaining biopsies of abnormal areas, in draining fluid from cysts on the ovary, and in finding IUDs (intrauterine device, birth control) that cannot be located. PREPARATION FOR TEST A transvaginal ultrasound is done with the bladder empty. The transabdominal ultrasound is done with your bladder full. You may be asked to drink several glasses of water before that exam. Sometimes, a transabdominal ultrasound is done just after a transvaginal ultrasound, to look at organs in your abdomen. PROCEDURE  You will lie down on a table, with your knees bent and your feet in foot holders. The probe is covered with a condom. A sterile lubricant is put into the vagina and on the probe. The lubricant helps transmit the sound waves and avoid irritating the vagina. Your caregiver will move the probe inside the vaginal cavity to scan the pelvic structures. A normal test will show a normal pelvis and normal contents. An abnormal test will show abnormalities of the pelvis, placenta, or baby. ABNORMAL RESULTS MAY BE DUE TO:  Growths or tumors in the:  Uterus.  Ovaries.  Vagina.  Other pelvic structures.  Non-cancerous growths of the uterus and ovaries.  Twisting of the ovary, cutting off blood supply to the ovary (ovarian torsion).  Areas of infection, including:  Pelvic  inflammatory disease.  Abscess in the pelvis.  Locating an IUD. PROBLEMS FOUND IN PREGNANT WOMEN MAY INCLUDE:  Ectopic pregnancy (pregnancy outside the uterus).  Multiple  pregnancies.  Early dilation (opening) of the cervix. This may indicate an incompetent cervix and early delivery.  Impending miscarriage.  Fetal death.  Problems with the placenta, including:  Placenta has grown over the opening of the womb (placenta previa).  Placenta has separated early in the womb (placental abruption).  Placenta grows into the muscle of the uterus (placenta accreta).  Tumors of pregnancy, including gestational trophoblastic disease. This is an abnormal pregnancy, with no fetus. The uterus is filled with many grape-like cysts that could sometimes be cancerous.  Incorrect position of the fetus (breech, vertex).  Intrauterine fetal growth retardation (IUGR) (poor growth in the womb).  Fetal abnormalities or infection. RISKS AND COMPLICATIONS There are no known risks to the ultrasound procedure. There is no X-ray used when doing an ultrasound. Document Released: 04/07/2004 Document Revised: 07/19/2011 Document Reviewed: 03/26/2009 [facility] Patient Information 2014 [place], [place]. Influenza Vaccine (Flu Vaccine, Inactivated) 2013 2014 What You Need to Know WHY GET VACCINATED?  Influenza ("flu") is a contagious disease that spreads around the [place] every winter, usually between October and May.  Flu is caused by the influenza virus, and can be spread by coughing, sneezing, and close contact.  Anyone can get flu, but the risk of getting flu is highest among children. Symptoms come on suddenly and may last several days. They can include:  Fever or chills.  Sore throat.  Muscle aches.  Fatigue.  Cough.  Headache.  Runny or stuffy nose. Flu can make some people much sicker than others. These people include young children, people 19 and older, pregnant women, and people with certain health conditions such as heart, lung or kidney disease, or a weakened immune system. Flu vaccine is especially important for these people, and anyone in close contact  with them. Flu can also lead to pneumonia, and make existing medical conditions worse. It can cause diarrhea and seizures in children. Each year thousands of people in the [place] die from flu, and many more are hospitalized. Flu vaccine is the best protection we have from flu and its complications. Flu vaccine also helps prevent spreading flu from person to person. INACTIVATED FLU VACCINE There are 2 types of influenza vaccine:  You are getting an inactivated flu vaccine, which does not contain any live influenza virus. It is given by injection with a needle, and often called the "flu shot."  A different live, attenuated (weakened) influenza vaccine is sprayed into the nostrils. This vaccine is described in a separate Vaccine Information Statement. Flu vaccine is recommended every year. Children 6 months through 57 years of age should get 2 doses the first year they get vaccinated. Flu viruses are always changing. Each year's flu vaccine is made to protect from viruses that are most likely to cause disease that year. While flu vaccine cannot prevent all cases of flu, it is our best defense against the disease. Inactivated flu vaccine protects against 3 or 4 different influenza viruses. It takes about 2 weeks for protection to develop after the vaccination, and protection lasts several months to a year. Some illnesses that are not caused by influenza virus are often mistaken for flu. Flu vaccine will not prevent these illnesses. It can only prevent influenza. A "high-dose" flu vaccine is available for people 8 years of age and older. The person giving you the vaccine  can tell you more about it. Some inactivated flu vaccine contains a very small amount of a mercury-based preservative called thimerosal. Studies have shown that thimerosal in vaccines is not harmful, but flu vaccines that do not contain a preservative are available. SOME PEOPLE SHOULD NOT GET THIS VACCINE Tell the person who gives  you the vaccine:  If you have any severe (life-threatening) allergies. If you ever had a life-threatening allergic reaction after a dose of flu vaccine, or have a severe allergy to any part of this vaccine, you may be advised not to get a dose. Most, but not all, types of flu vaccine contain a small amount of egg.  If you ever had Guillain Barr Syndrome (a severe paralyzing illness, also called GBS). Some people with a history of GBS should not get this vaccine. This should be discussed with your doctor.  If you are not feeling well. They might suggest waiting until you feel better. But you should come back. RISKS OF A VACCINE REACTION With a vaccine, like any medicine, there is a chance of side effects. These are usually mild and go away on their own. Serious side effects are also possible, but are very rare. Inactivated flu vaccine does not contain live flu virus, sogetting flu from this vaccine is not possible. Brief fainting spells and related symptoms (such as jerking movements) can happen after any medical procedure, including vaccination. Sitting or lying down for about 15 minutes after a vaccination can help prevent fainting and injuries caused by falls. Tell your doctor if you feel dizzy or lightheaded, or have vision changes or ringing in the ears. Mild problems following inactivated flu vaccine:  Soreness, redness, or swelling where the shot was given.  Hoarseness; sore, red or itchy eyes; or cough.  Fever.  Aches.  Headache.  Itching.  Fatigue. If these problems occur, they usually begin soon after the shot and last 1 or 2 days. Moderate problems following inactivated flu vaccine:  Young children who get inactivated flu vaccine and pneumococcal vaccine (PCV13) at the same time may be at increased risk for seizures caused by fever. Ask your doctor for more information. Tell your doctor if a child who is getting flu vaccine has ever had a seizure. Severe problems following  inactivated flu vaccine:  A severe allergic reaction could occur after any vaccine (estimated less than 1 in a million doses).  There is a small possibility that inactivated flu vaccine could be associated with Guillan Barr Syndrome (GBS), no more than 1 or 2 cases per million people vaccinated. This is much lower than the risk of severe complications from flu, which can be prevented by flu vaccine. The safety of vaccines is always being monitored. For more information, visit: http://floyd.org/ WHAT IF THERE IS A SERIOUS REACTION? What should I look for?  Look for anything that concerns you, such as signs of a severe allergic reaction, very high fever, or behavior changes. Signs of a severe allergic reaction can include hives, swelling of the face and throat, difficulty breathing, a fast heartbeat, dizziness, and weakness. These would start a few minutes to a few hours after the vaccination. What should I do?  If you think it is a severe allergic reaction or other emergency that cannot wait, call 9 1 1  or get the person to the nearest hospital. Otherwise, call your doctor.  Afterward, the reaction should be reported to the Vaccine Adverse Event Reporting System (VAERS). Your doctor might file this report, or you can  do it yourself through the VAERS website at www.vaers.LAgents.no, or by calling 1-7181530206. VAERS is only for reporting reactions. They do not give medical advice. THE NATIONAL VACCINE INJURY COMPENSATION PROGRAM The National Vaccine Injury Compensation Program (VICP) is a federal program that was created to compensate people who may have been injured by certain vaccines. Persons who believe they may have been injured by a vaccine can learn about the program and about filing a claim by calling 1-707-035-7708 or visiting the VICP website at SpiritualWord.at HOW CAN I LEARN MORE?  Ask your doctor.  Call your local or state health department.  Contact the  Centers for Disease Control and Prevention (CDC):  Call (563) 039-4665 (1-800-CDC-INFO) or  Visit CDC's website at BiotechRoom.com.cy CDC Inactivated Influenza Vaccine Interim VIS (12/03/11) Document Released: 02/18/2006 Document Revised: 01/19/2012 Document Reviewed: 12/03/2011 [facility] Patient Information 2014 [place], [place].

## 2013-03-07 ENCOUNTER — Other Ambulatory Visit: Payer: Self-pay | Admitting: Gynecology

## 2013-03-07 DIAGNOSIS — N950 Postmenopausal bleeding: Secondary | ICD-10-CM

## 2013-03-07 DIAGNOSIS — N83339 Acquired atrophy of ovary and fallopian tube, unspecified side: Secondary | ICD-10-CM

## 2013-03-08 ENCOUNTER — Telehealth (HOSPITAL_COMMUNITY): Payer: Self-pay

## 2013-03-15 ENCOUNTER — Other Ambulatory Visit: Payer: Self-pay | Admitting: Gynecology

## 2013-03-15 ENCOUNTER — Ambulatory Visit (INDEPENDENT_AMBULATORY_CARE_PROVIDER_SITE_OTHER): Payer: Medicare Other | Admitting: Gynecology

## 2013-03-15 ENCOUNTER — Ambulatory Visit (INDEPENDENT_AMBULATORY_CARE_PROVIDER_SITE_OTHER): Payer: Medicare Other

## 2013-03-15 DIAGNOSIS — N950 Postmenopausal bleeding: Secondary | ICD-10-CM

## 2013-03-15 DIAGNOSIS — R9389 Abnormal findings on diagnostic imaging of other specified body structures: Secondary | ICD-10-CM

## 2013-03-15 DIAGNOSIS — N838 Other noninflammatory disorders of ovary, fallopian tube and broad ligament: Secondary | ICD-10-CM

## 2013-03-15 DIAGNOSIS — N898 Other specified noninflammatory disorders of vagina: Secondary | ICD-10-CM | POA: Insufficient documentation

## 2013-03-15 DIAGNOSIS — N83339 Acquired atrophy of ovary and fallopian tube, unspecified side: Secondary | ICD-10-CM

## 2013-03-15 DIAGNOSIS — N839 Noninflammatory disorder of ovary, fallopian tube and broad ligament, unspecified: Secondary | ICD-10-CM

## 2013-03-15 DIAGNOSIS — N83202 Unspecified ovarian cyst, left side: Secondary | ICD-10-CM | POA: Insufficient documentation

## 2013-03-15 DIAGNOSIS — N760 Acute vaginitis: Secondary | ICD-10-CM | POA: Diagnosis not present

## 2013-03-15 DIAGNOSIS — R1909 Other intra-abdominal and pelvic swelling, mass and lump: Secondary | ICD-10-CM | POA: Diagnosis not present

## 2013-03-15 MED ORDER — CLINDAMYCIN PHOSPHATE 2 % VA CREA: 1.0000 | TOPICAL_CREAM | Freq: Every day | VAGINAL | Status: DC

## 2013-03-15 NOTE — Progress Notes (Signed)
Patient presented to the office today for followup sonohysterogram as a result of her postmenopausal bleeding that she described what she was in the office on October 20. Patient had not had a menstrual cycle in over 5 years. Patient has not been on any hormonal replacement therapy. Patient's recent Pap smear and endometrial biopsy were benign. Patient did have a history of DVT at time of pregnancy in 2003 and was anticoagulated for several months thereafter.  At time of placement of the speculum to proceed with a sonohysterogram she was noted to have a fleshy-looking lesion at the right vaginal fornix at the 10:00 position. The cervix was cleansed with Betadine solution and a small intrauterine catheter was introduced into the uterus and normal saline was instilled. There was no intracavitary defects noted. Her endometrial stripe was 4.6 mm and her uterus measures 6.7 x 4.3 x 3.4 cm. Incidental finding of a left ovarian solid focal mass with tiny cystic areas avascular measuring 14 x 10 x 15 mm. Right ovary was otherwise normal and atrophic.  This exophytic area on the right vaginal fornix was biopsied and submitted for histological evaluation. Patient will return back to the office in 2 weeks and depending on the results of the biopsy we will possibly treat with electrocautery and the office or cryo-. She was given prescription of Cleocin vaginal cream to apply each bedtime for one week. A CA 125 was ordered today its limitations were discussed with the patient. Will then repeat the ultrasound in 3 months to see if the cyst has resolved.

## 2013-03-15 NOTE — Addendum Note (Signed)
Addended by: Bertram Savin A on: 03/15/2013 04:07 PM   Modules accepted: Orders

## 2013-03-16 LAB — CA 125: CA 125: 12.7 U/mL (ref 0.0–30.2)

## 2013-03-19 ENCOUNTER — Telehealth: Payer: Self-pay | Admitting: *Deleted

## 2013-03-19 NOTE — Telephone Encounter (Signed)
Pt mother ann called stating pt is having some bleeding from [facility] on 03/15/13. Not heavy bleeding wearing tampon, not pain, I explained to mother that bleeding after [facility] is normal. Pt mother will call back if bleeding should continue or become heavier.

## 2013-03-21 ENCOUNTER — Telehealth: Payer: Self-pay | Admitting: *Deleted

## 2013-03-21 NOTE — Telephone Encounter (Signed)
Have her come to the office tomorrow for followup we'll take a look at it

## 2013-03-21 NOTE — Telephone Encounter (Signed)
Pt has BX done on 03/15/13 had some spotting after but on saturday a full blown cycle has started. Not bleeding heavy wearing tampon now was wearing liner at first with spotting. Please advise

## 2013-03-21 NOTE — Telephone Encounter (Signed)
Left the below on pt voicemail. 

## 2013-03-22 ENCOUNTER — Encounter: Payer: Self-pay | Admitting: Gynecology

## 2013-03-22 ENCOUNTER — Ambulatory Visit (INDEPENDENT_AMBULATORY_CARE_PROVIDER_SITE_OTHER): Payer: Medicare Other | Admitting: Gynecology

## 2013-03-22 VITALS — BP 122/78

## 2013-03-22 DIAGNOSIS — N950 Postmenopausal bleeding: Secondary | ICD-10-CM

## 2013-03-22 DIAGNOSIS — N83209 Unspecified ovarian cyst, unspecified side: Secondary | ICD-10-CM

## 2013-03-22 DIAGNOSIS — N83202 Unspecified ovarian cyst, left side: Secondary | ICD-10-CM

## 2013-03-22 MED ORDER — MEGESTROL ACETATE 40 MG PO TABS: ORAL_TABLET | ORAL | Status: DC

## 2013-03-22 NOTE — Progress Notes (Signed)
The patient presented to the office today as a result of her postmenopausal bleeding.patient was seen in the office on November 6 of this year.Patient has not been on any hormonal replacement therapy. Patient's recent Pap smear and endometrial biopsy were benign. Patient did have a history of DVT at time of pregnancy in 2003 and was anticoagulated for several months thereafter.  At time of placement of the speculum to proceed with a sonohysterogram she was noted to have a fleshy-looking lesion at the right vaginal fornix at the 10:00 position. This area was biopsied and pathology report was as follows:  Vagina, biopsy, right fornix, 10 o'clock - INFLAMED AND FOCALLY ULCERATED SQUAMOUS LINED MUCOSA. - THERE IS NO EVIDENCE OF MALIGNANCY. - SEE COMMENT. Microscopic Comment The histologic findings are non specific, but suggest an acute/chronic irritative process  On that day she also underwent a sonohysterogram and endometrial biopsy.There was no intracavitary defects noted. Her endometrial stripe was 4.6 mm and her uterus measures 6.7 x 4.3 x 3.4 cm. Incidental finding of a left ovarian solid focal mass with tiny cystic areas avascular measuring 14 x 10 x 15 mm. Right ovary was otherwise normal and atrophic.  Endometrial biopsy report:Diagnosis Endometrium, biopsy - DEGENERATING SECRETORY-TYPE ENDOMETRIUM WITH STROMAL AND GLANDULAR BREAKDOWN, SEE COMMENT - NO EVIDENCE OF ATYPIA, HYPERPLASIA OR MALIGNANCY. Microscopic Comment This pattern can be associated with menses, irregular shedding or breakthrough bleeding associated with hormonal therapy   She had a normal CA 125.  She returned today because she was bleeding after her biopsy.  Exam: Bartholin urethra Skene was within normal limits Vagina: Some blood was noted in the vaginal vault. Right vaginal fornix site of previous biopsy slightly oozing was contained with Monsel solution. Although there appeared to be some blood coming through the  cervical os.   Assessment/plan: Patient with postmenopausal bleeding recent normal endometrial biopsy And sonohysterogram. Patient will be placed on Megace 40 mg twice a day for 10 days of the month for the next 3 months. She will then return to the office in February for an ultrasound to followup with a small left ovarian cyst and check on her endometrial stripe. She will refrain from intercourse for the next 2 weeks. She will return back in January so that we can apply cryo-treatment to the right vaginal fornix irritated area. She did use Cleocin vaginal cream recently as well.

## 2013-03-29 ENCOUNTER — Ambulatory Visit: Payer: Medicare Other | Admitting: Gynecology

## 2013-03-31 DIAGNOSIS — J069 Acute upper respiratory infection, unspecified: Secondary | ICD-10-CM | POA: Diagnosis not present

## 2013-04-02 ENCOUNTER — Ambulatory Visit (HOSPITAL_COMMUNITY): Payer: Self-pay | Admitting: Physician Assistant

## 2013-04-20 ENCOUNTER — Ambulatory Visit (INDEPENDENT_AMBULATORY_CARE_PROVIDER_SITE_OTHER): Payer: Medicare Other | Admitting: Psychiatry

## 2013-04-20 DIAGNOSIS — F321 Major depressive disorder, single episode, moderate: Secondary | ICD-10-CM

## 2013-04-20 DIAGNOSIS — F332 Major depressive disorder, recurrent severe without psychotic features: Secondary | ICD-10-CM

## 2013-04-20 MED ORDER — BUPROPION HCL ER (XL) 450 MG PO TB24: 450.0000 mg | ORAL_TABLET | Freq: Every morning | ORAL | Status: DC

## 2013-04-20 MED ORDER — ARIPIPRAZOLE 10 MG PO TABS: 10.0000 mg | ORAL_TABLET | Freq: Every morning | ORAL | Status: DC

## 2013-04-20 MED ORDER — DESVENLAFAXINE SUCCINATE ER 100 MG PO TB24: 100.0000 mg | ORAL_TABLET | Freq: Every morning | ORAL | Status: DC

## 2013-04-20 NOTE — Progress Notes (Signed)
[facility] MD Progress Note  04/20/2013 11:41 AM [name]  MRN:  [id number] Subjective: Feels great This patient is a middle-aged white female who is actually doing very well. She relapsed with her alcohol 3 months ago but has been clean since. She goes to a regular rate. 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 She also has significant depression and is successfully treated with Abilify 10 mg Wellbutrin and Prestiqe. The patient is particularly happy this time as she has a boyfriend. She feels good and up. She sleeps and eats well. Her energy level is good. She denies worthlessness. The patient denies anhedonia. The patient denies being suicidal. At this time she is alcohol free and does not use drugs. She denies ever experiencing psychosis. Presently the patient is actively involved in one-to-one talking therapy with Evelena Peat. The patient feels positive about life and looks forward to Christmas. The patient is stable on these medications. At Diagnosis:   DSM5: Schizophrenia Disorders:   Obsessive-Compulsive Disorders:   Trauma-Stressor Disorders:   Substance/Addictive Disorders:   Depressive Disorders:  Major Depressive Disorder - Moderate (296.22)  Axis I: Major Depression, Recurrent severe  ADL's:  Intact  Sleep: Good  Appetite:  Good  Suicidal Ideation:  no Homicidal Ideation:  no AEB (as evidenced by):  Psychiatric Specialty Exam: ROS  There were no vitals taken for this visit.There is no weight on file to calculate BMI.  General Appearance: Casual  Eye Contact::  Good  Speech:  Clear and Coherent  Volume:  Normal  Mood:  Euthymic  Affect:  Appropriate  Thought Process:  Coherent  Orientation:  Full (Time, Place, and Person)  Thought Content:  WDL  Suicidal Thoughts:  No  Homicidal Thoughts:  No  Memory:  nl  Judgement:  Good  Insight:  Good  Psychomotor Activity:  Normal  Concentration:  Good  Recall:  Good  Akathisia:  No  Handed:  Right  AIMS (if indicated):      Assets:  Communication Skills  Sleep:      Current Medications: Current Outpatient Prescriptions  Medication Sig Dispense Refill  . ARIPiprazole (ABILIFY) 10 MG tablet Take 1 tablet (10 mg total) by mouth every morning.  30 tablet  6  . aspirin EC 81 MG tablet Take 81 mg by mouth every morning.      . BuPROPion HCl ER, XL, (FORFIVO XL) 450 MG TB24 Take 450 mg by mouth every morning.  30 tablet  6  . Butalbital-APAP-Caffeine (FIORICET PO) Take 1 tablet by mouth daily as needed (MIGRAINE).      . Calcium Carbonate-Vitamin D (CALCIUM 600 + D PO) Take 1 tablet by mouth 2 (two) times daily.      . carvedilol (COREG) 25 MG tablet Take 25 mg by mouth 2 (two) times daily with a meal.      . clindamycin (CLEOCIN) 2 % vaginal cream Place 1 Applicatorful vaginally at bedtime.  40 g  0  . desvenlafaxine (PRISTIQ) 100 MG 24 hr tablet Take 1 tablet (100 mg total) by mouth every morning.  30 tablet  6  . fish oil-omega-3 fatty acids 1000 MG capsule Take 1 g by mouth 2 (two) times daily.       . Flaxseed, Linseed, (FLAXSEED OIL) 1000 MG CAPS Take 1,000 mg by mouth 2 (two) times daily.      . hydrochlorothiazide (MICROZIDE) 12.5 MG capsule Take 12.5 mg by mouth every morning.       [name] HYDROcodone-acetaminophen (NORCO/VICODIN)  5-325 MG per tablet Take 1 tablet by mouth every 4 (four) hours as needed for pain.  20 tablet  0  . megestrol (MEGACE) 40 MG tablet Take 1 tablet by mouth twice a day for 10 days a month for the month of November, December and January  20 tablet  2  . Multiple Vitamin (MULTIVITAMIN) tablet Take 1 tablet by mouth every morning.        No current facility-administered medications for this visit.    Lab Results: No results found for this or any previous visit (from the past 48 hour(s)).  Physical Findings: AIMS:  , ,  ,  ,    CIWA:    COWS:     Treatment Plan Summary: At this time the patient is doing very well. She shall continue taking Abilify 10 mg, Wellbutrin 450 and Prestique  100 mg. The patient continue in one-to-one talking treatment with Evelena Peat. The patient to return to see me in 3 months where we will reevaluate her state. The patient will continue going to AA regularly we'll do everything she can to abstain from drinking any alcohol.  Plan:  Medical Decision Making Problem Points:  Established problem, stable/improving (1) Data Points:  Review of medication regiment & side effects (2)  I certify that inpatient services furnished can reasonably be expected to improve the patient's condition.   [name] 04/20/2013, 11:41 AM

## 2013-04-23 ENCOUNTER — Other Ambulatory Visit: Payer: Self-pay | Admitting: Physician Assistant

## 2013-04-23 ENCOUNTER — Ambulatory Visit
Admission: RE | Admit: 2013-04-23 | Discharge: 2013-04-23 | Disposition: A | Discharge status: Home / Self Care | Payer: Medicare Other | Source: Ambulatory Visit | Attending: Physician Assistant | Admitting: Physician Assistant

## 2013-04-23 DIAGNOSIS — R0781 Pleurodynia: Secondary | ICD-10-CM

## 2013-04-23 DIAGNOSIS — S2341XA Sprain of ribs, initial encounter: Secondary | ICD-10-CM | POA: Diagnosis not present

## 2013-04-23 DIAGNOSIS — R079 Chest pain, unspecified: Secondary | ICD-10-CM | POA: Diagnosis not present

## 2013-05-16 ENCOUNTER — Ambulatory Visit (INDEPENDENT_AMBULATORY_CARE_PROVIDER_SITE_OTHER): Payer: Medicare Other | Admitting: Gynecology

## 2013-05-16 ENCOUNTER — Encounter: Payer: Self-pay | Admitting: Gynecology

## 2013-05-16 VITALS — BP 130/86

## 2013-05-16 DIAGNOSIS — N898 Other specified noninflammatory disorders of vagina: Secondary | ICD-10-CM | POA: Diagnosis not present

## 2013-05-16 DIAGNOSIS — R351 Nocturia: Secondary | ICD-10-CM

## 2013-05-16 NOTE — Progress Notes (Signed)
   Patient presented to the office today for followup of previously seen right vaginal fornix irritation. Patient had been evaluated for postmenopausal bleeding. She was last seen in the office 03/22/2013 with the following summary:  At time of placement of the speculum to proceed with a sonohysterogram she was noted to have a fleshy-looking lesion at the right vaginal fornix at the 10:00 position. This area was biopsied and pathology report was as follows:  Vagina, biopsy, right fornix, 10 o'clock  - INFLAMED AND FOCALLY ULCERATED SQUAMOUS LINED MUCOSA.  - THERE IS NO EVIDENCE OF MALIGNANCY.  - SEE COMMENT.  Microscopic Comment  The histologic findings are non specific, but suggest an acute/chronic irritative process  On that day she also underwent a sonohysterogram and endometrial biopsy.There was no intracavitary defects noted. Her endometrial stripe was 4.6 mm and her uterus measures 6.7 x 4.3 x 3.4 cm. Incidental finding of a left ovarian solid focal mass with tiny cystic areas avascular measuring 14 x 10 x 15 mm. Right ovary was otherwise normal and atrophic.  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  Microscopic Comment  This pattern can be associated with menses, irregular shedding or breakthrough bleeding associated with  hormonal therapy   She had a normal CA 125.   The patient had been started on Megace 40 mg twice a day for 10 days for 3 months. She has had no further vaginal bleeding. Has had intercourse with no problems. With patient's past history of DVT during her pregnancy and she is not a candidate for HRT although her vasomotor symptoms or report to be minimal.  She did apply the Cleocin vaginal cream. She has had normal Pap smear.  Exam: Bartholin urethra Skene was within normal limits Vagina: No lesions or  discharge Vaginal fornix no irritation or ulcerated areas noted today. Bimanual exam uterus anteverted normal size shape and consistency adnexa no palpable mass or tenderness rectal exam: Not done  Assessment/plan: Patient doing well will return back to the office next month for followup ultrasound on the left ovarian cyst. Patient has informed me that she gets up to urinate 3 or 4 times at night and is not sure she has urgency during the day. She denies any urinary incontinence. She does recall that she drinks tea at night before going to bed. I have asked her to maintain a urine log for 2 weeks before she comes in for her ultrasound for me to review and meanwhile for her to discontinue any caffeine-containing products before going to bed. If she continues to have nocturia we discussed about possibly starting her on anticholinergic agent.

## 2013-06-02 ENCOUNTER — Emergency Department (HOSPITAL_COMMUNITY)
Admission: EM | Admit: 2013-06-02 | Discharge: 2013-06-02 | Disposition: A | Discharge status: Home / Self Care | Payer: Medicare Other | Attending: Emergency Medicine | Admitting: Emergency Medicine

## 2013-06-02 ENCOUNTER — Encounter (HOSPITAL_COMMUNITY): Payer: Self-pay | Admitting: Emergency Medicine

## 2013-06-02 DIAGNOSIS — I10 Essential (primary) hypertension: Secondary | ICD-10-CM | POA: Diagnosis not present

## 2013-06-02 DIAGNOSIS — F3289 Other specified depressive episodes: Secondary | ICD-10-CM | POA: Diagnosis not present

## 2013-06-02 DIAGNOSIS — Z79899 Other long term (current) drug therapy: Secondary | ICD-10-CM | POA: Insufficient documentation

## 2013-06-02 DIAGNOSIS — Z86711 Personal history of pulmonary embolism: Secondary | ICD-10-CM | POA: Insufficient documentation

## 2013-06-02 DIAGNOSIS — F329 Major depressive disorder, single episode, unspecified: Secondary | ICD-10-CM | POA: Diagnosis not present

## 2013-06-02 DIAGNOSIS — Z7982 Long term (current) use of aspirin: Secondary | ICD-10-CM | POA: Insufficient documentation

## 2013-06-02 DIAGNOSIS — R51 Headache: Secondary | ICD-10-CM | POA: Diagnosis not present

## 2013-06-02 DIAGNOSIS — Z9089 Acquired absence of other organs: Secondary | ICD-10-CM | POA: Insufficient documentation

## 2013-06-02 DIAGNOSIS — M129 Arthropathy, unspecified: Secondary | ICD-10-CM | POA: Diagnosis not present

## 2013-06-02 DIAGNOSIS — Z8719 Personal history of other diseases of the digestive system: Secondary | ICD-10-CM | POA: Diagnosis not present

## 2013-06-02 DIAGNOSIS — G43909 Migraine, unspecified, not intractable, without status migrainosus: Secondary | ICD-10-CM | POA: Insufficient documentation

## 2013-06-02 DIAGNOSIS — R519 Headache, unspecified: Secondary | ICD-10-CM

## 2013-06-02 MED ORDER — HALOPERIDOL LACTATE 5 MG/ML IJ SOLN
2.5000 mg | Freq: Once | INTRAMUSCULAR | Status: AC
  Administered 2013-06-02: 2.5 mg via INTRAVENOUS
  Filled 2013-06-02: qty 1

## 2013-06-02 MED ORDER — BUTALBITAL-APAP-CAFFEINE 50-325-40 MG PO TABS: 1.0000 | ORAL_TABLET | Freq: Four times a day (QID) | ORAL | Status: DC | PRN

## 2013-06-02 MED ORDER — DEXAMETHASONE SODIUM PHOSPHATE 10 MG/ML IJ SOLN
10.0000 mg | Freq: Once | INTRAMUSCULAR | Status: AC
  Administered 2013-06-02: 10 mg via INTRAVENOUS
  Filled 2013-06-02: qty 1

## 2013-06-02 MED ORDER — METOCLOPRAMIDE HCL 5 MG/ML IJ SOLN
10.0000 mg | Freq: Once | INTRAMUSCULAR | Status: AC
  Administered 2013-06-02: 10 mg via INTRAVENOUS
  Filled 2013-06-02: qty 2

## 2013-06-02 MED ORDER — DIPHENHYDRAMINE HCL 50 MG/ML IJ SOLN
12.5000 mg | Freq: Once | INTRAMUSCULAR | Status: AC
  Administered 2013-06-02: 12.5 mg via INTRAVENOUS
  Filled 2013-06-02: qty 1

## 2013-06-02 MED ORDER — SODIUM CHLORIDE 0.9 % IV BOLUS (SEPSIS)
1000.0000 mL | Freq: Once | INTRAVENOUS | Status: AC
  Administered 2013-06-02: 1000 mL via INTRAVENOUS

## 2013-06-02 MED ORDER — KETOROLAC TROMETHAMINE 30 MG/ML IJ SOLN
30.0000 mg | Freq: Once | INTRAMUSCULAR | Status: AC
  Administered 2013-06-02: 30 mg via INTRAVENOUS
  Filled 2013-06-02: qty 1

## 2013-06-02 NOTE — ED Notes (Signed)
Presents with migraine headache, typical migraine for her, pain in back of neck and behinde eyes assocaited with nausea and vomiting. Alert, oreinted, MAEx4, no droop or drift. Headache began this morning. Pt is hearing impaired.  Light makes pain worse.

## 2013-06-02 NOTE — Discharge Instructions (Signed)
Follow up with your doctor on Monday. Take medications for migraine as directed. If Headache should return or worsen or new symptoms occur please return to ED.

## 2013-06-02 NOTE — ED Provider Notes (Signed)
CSN: [id number]     Arrival date & time 06/02/13  1814 History   First MD Initiated Contact with Patient 06/02/13 1822     Chief Complaint  Patient presents with  . Migraine   (Consider location/radiation/quality/duration/timing/severity/associated sxs/prior Treatment) Patient is a 53 y.o. female presenting with migraines.  Migraine   53 yo female with hx of mirgraines presents with HA that started this morning at 10 am and gradually worsened throughout the day. HA is localized to occipital region and described as throbbing in nature with associated N/V x 4 episodes, photosensitivity. Patient states these symptoms are typical of her hx of migraines except that she states the pain is usually constant instead of throbbing. Pain currently rated 10/10. Patient states she usually takes fioricet for migraines which usually help but she has ran out so she took some excedrin today without relief. Patient Denies any fever/chills, dizziness, or visual changes. Patient does admits to some neck stiffness. Denies hx of trauma. PMH significant for Hearing impairment, Firbomyalgia, PE, HTN, and Cardiomyopathy.  Past Medical History  Diagnosis Date  . Depression   . Migraine   . Fibromyalgia   . Hearing impairment   . Arthritis   . Cardiomyopathy   . Pulmonary embolism   . Gastritis   . Hypertension    Past Surgical History  Procedure Laterality Date  . Appendectomy    . Inner ear surgery      TUBES  . Tonsillectomy and adenoidectomy    . Tubal ligation    . Laparoscopic cholecystectomy  2012  . Cardiac surgery  January 2007    mitral valve repair  . Cardiac surgery  1973    atrial septal defect   Family History  Problem Relation Age of Onset  . Cancer Mother   . Breast cancer Mother   . Depression Sister   . Heart disease Father   . Hypertension Father    History  Substance Use Topics  . Smoking status: Never Smoker   . Smokeless tobacco: Not on file  . Alcohol Use: No   OB  History   Grav Para Term Preterm Abortions TAB SAB Ect Mult Living   2 2 2       2      Review of Systems  All other systems reviewed and are negative.    Allergies  Depakote and Valium  Home Medications   Current Outpatient Rx  Name  Route  Sig  Dispense  Refill  . ARIPiprazole (ABILIFY) 10 MG tablet   Oral   Take 10 mg by mouth daily.         [name] aspirin EC 81 MG tablet   Oral   Take 81 mg by mouth every morning.         [name] aspirin-acetaminophen-caffeine (EXCEDRIN MIGRAINE) 250-250-65 MG per tablet   Oral   Take 2 tablets by mouth daily as needed for headache.         . BuPROPion HCl ER, XL, (FORFIVO XL) 450 MG TB24   Oral   Take 450 mg by mouth every morning.         . butalbital-acetaminophen-caffeine (FIORICET, ESGIC) 50-325-40 MG per tablet   Oral   Take 1 tablet by mouth 2 (two) times daily as needed for headache.         . Calcium Carbonate-Vitamin D (CALCIUM 600 + D PO)   Oral   Take 1 tablet by mouth 2 (two) times daily.         [name]  carvedilol (COREG) 25 MG tablet   Oral   Take 25 mg by mouth 2 (two) times daily with a meal.         . desvenlafaxine (PRISTIQ) 100 MG 24 hr tablet   Oral   Take 100 mg by mouth daily.         . fish oil-omega-3 fatty acids 1000 MG capsule   Oral   Take 1 g by mouth 2 (two) times daily.          . Flaxseed, Linseed, (FLAXSEED OIL) 1000 MG CAPS   Oral   Take 1,000 mg by mouth 2 (two) times daily.         . hydrochlorothiazide (MICROZIDE) 12.5 MG capsule   Oral   Take 12.5 mg by mouth every morning.          . Multiple Vitamin (MULTIVITAMIN) tablet   Oral   Take 1 tablet by mouth daily.          . butalbital-acetaminophen-caffeine (FIORICET) 50-325-40 MG per tablet   Oral   Take 1-2 tablets by mouth every 6 (six) hours as needed for headache.   20 tablet   0    BP 126/70  Pulse 68  Temp(Src) 98.6 F (37 C) (Oral)  Resp 16  SpO2 98% Physical Exam  Nursing note and vitals  reviewed. Constitutional: She is oriented to person, place, and time. She appears well-developed and well-nourished. No distress.  HENT:  Head: Normocephalic and atraumatic.  Right Ear: Ear canal normal. Tympanic membrane is scarred.  Left Ear: Ear canal normal. Tympanic membrane is scarred.  Nose: Mucosal edema present.  Mouth/Throat: Uvula is midline, oropharynx is clear and moist and mucous membranes are normal. No oropharyngeal exudate.  Eyes: Conjunctivae, EOM and lids are normal. Pupils are equal, round, and reactive to light. Right eye exhibits no discharge. Left eye exhibits no discharge. Right conjunctiva is not injected. Left conjunctiva is not injected. No scleral icterus. Right eye exhibits normal extraocular motion and no nystagmus. Left eye exhibits normal extraocular motion and no nystagmus.  Neck: Trachea normal, normal range of motion and phonation normal. Neck supple. No JVD present. Muscular tenderness present. No spinous process tenderness present. No rigidity. No edema and normal range of motion present.  Cardiovascular: Normal rate, regular rhythm and normal heart sounds.  Exam reveals no gallop and no friction rub.   No murmur heard. Pulmonary/Chest: Effort normal and breath sounds normal. No respiratory distress. She has no wheezes. She has no rales.  Abdominal: Soft. Bowel sounds are normal. She exhibits no distension. There is no tenderness. There is no guarding.  Musculoskeletal: Normal range of motion. She exhibits no edema.  Lymphadenopathy:    She has no cervical adenopathy.  Neurological: She is alert and oriented to person, place, and time. She has normal strength. She is not disoriented. No cranial nerve deficit or sensory deficit. She displays a negative Romberg sign. Coordination and gait normal.  CN II -XII grossly intact. Patient ambulates without assistance in ED.  Cerebellar function appears intact and normal with finger to nose test.   Skin: Skin is warm  and dry. She is not diaphoretic.  Psychiatric: She has a normal mood and affect. Her speech is normal and behavior is normal.    ED Course  Procedures (including critical care time) Labs Review Labs Reviewed - No data to display Imaging Review No results found.  EKG Interpretation   None       MDM  1. Headache    Patient mildly tachycardic on admission, resolved with IV fluids.  Patient afebrile with normal VS.  Neuro exam normal without any focal deficits.  HA markedly improved with tx in ED. Patient states she is currently ready to be discharged.   Patient advised to follow up with PCP on Monday. Fioricet refilled for patient, take as directed. Gave patient return precautions should her symptoms worsen. Patient agrees with plan. Discharged in good condition.  Meds given in ED:  Medications  metoCLOPramide (REGLAN) injection 10 mg (10 mg Intravenous Given 06/02/13 1910)  sodium chloride 0.9 % bolus 1,000 mL (0 mLs Intravenous Stopped 06/02/13 2031)  diphenhydrAMINE (BENADRYL) injection 12.5 mg (12.5 mg Intravenous Given 06/02/13 1906)  dexamethasone (DECADRON) injection 10 mg (10 mg Intravenous Given 06/02/13 1907)  ketorolac (TORADOL) 30 MG/ML injection 30 mg (30 mg Intravenous Given 06/02/13 1952)  haloperidol lactate (HALDOL) injection 2.5 mg (2.5 mg Intravenous Given 06/02/13 2039)    Discharge Medication List as of 06/02/2013  9:04 PM    START taking these medications   Details  !! butalbital-acetaminophen-caffeine (FIORICET) 50-325-40 MG per tablet Take 1-2 tablets by mouth every 6 (six) hours as needed for headache., Starting 06/02/2013, Until Sun 06/02/14, Print     !! - Potential duplicate medications found. Please discuss with provider.         Sherrie George, PA-C 06/04/13 [id number]

## 2013-06-02 NOTE — ED Notes (Signed)
Pt ambulated to restroom without assistance. Steady gait.  Pt drinking gingerale. Denies nausea at present time.

## 2013-06-05 NOTE — ED Provider Notes (Signed)
Medical screening examination/treatment/procedure(s) were performed by non-physician practitioner and as supervising physician I was immediately available for consultation/collaboration.  EKG Interpretation   None         Blanchard Kelch, MD 06/05/13 1126

## 2013-06-13 ENCOUNTER — Ambulatory Visit (INDEPENDENT_AMBULATORY_CARE_PROVIDER_SITE_OTHER): Payer: Medicare Other | Admitting: Gynecology

## 2013-06-13 ENCOUNTER — Encounter: Payer: Self-pay | Admitting: Gynecology

## 2013-06-13 ENCOUNTER — Ambulatory Visit (INDEPENDENT_AMBULATORY_CARE_PROVIDER_SITE_OTHER): Payer: Medicare Other

## 2013-06-13 DIAGNOSIS — N83209 Unspecified ovarian cyst, unspecified side: Secondary | ICD-10-CM

## 2013-06-13 DIAGNOSIS — N839 Noninflammatory disorder of ovary, fallopian tube and broad ligament, unspecified: Secondary | ICD-10-CM

## 2013-06-13 DIAGNOSIS — N3281 Overactive bladder: Secondary | ICD-10-CM

## 2013-06-13 DIAGNOSIS — N318 Other neuromuscular dysfunction of bladder: Secondary | ICD-10-CM | POA: Diagnosis not present

## 2013-06-13 DIAGNOSIS — N838 Other noninflammatory disorders of ovary, fallopian tube and broad ligament: Secondary | ICD-10-CM

## 2013-06-13 MED ORDER — MIRABEGRON ER 25 MG PO TB24: 25.0000 mg | ORAL_TABLET | Freq: Every day | ORAL | Status: DC

## 2013-06-13 NOTE — Progress Notes (Signed)
   Patient presented to the office today to discuss her ultrasound  to followup on the previously seen left ovarian cyst. See office note dated January 7 for full details. Patient has been complaining of urinary frequency and nocturia and occasional urge incontinence if her bladder is full. She states it regardless of her fluid intake she has the symptoms.  Ultrasound today: Uterus measures 5.5 x 4.1 x 3.4 cm with endometrial stripe of 2.5 mm. No evidence of fibroid, history of C-section in the past. Right ovary atrophic normal. Left ovary atrophic normal the previously seen cystic solid area not seen on ultrasound today. There was no fluid in the cul-de-sac. Of note patient had had a previous normal CA 125.  Assessment/plan: Complete resolution of previously seen left ovarian cyst. Patient with signs and symptoms of detrusor dyssynergia will be started on low-dose anti-cholinergic agents such as Myrbtriq 25 mg daily. We discussed the risks benefits and pros and cons of potential side effects such as with anticholinergic agents. Based on her medication list there is no contraindication. Patient denies any history of glaucoma. Literature and information on the medication as well as on overactive bladder was provided. Patient's otherwise get her to return back to the office in 1-2 months for her annual gynecological exam.

## 2013-06-13 NOTE — Patient Instructions (Signed)
Overactive Bladder, Adult The bladder has two functions that are totally opposite of the other. One is to relax and stretch out so it can store urine (fills like a balloon), and the other is to contract and squeeze down so that it can empty the urine that it has stored. Proper functioning of the bladder is a complex mixing of these two functions. The filling and emptying of the bladder can be influenced by:  The bladder.  The spinal cord.  The brain.  The nerves going to the bladder.  Other organs that are closely related to the bladder such as prostate in males and the vagina in females. As your bladder fills with urine, nerve signals are sent from the bladder to the brain to tell you that you may need to urinate. Normal urination requires that the bladder squeeze down with sufficient strength to empty the bladder, but this also requires that the bladder squeeze down sufficiently long to finish the job. In addition the sphincter muscles, which normally keep you from leaking urine, must also relax so that the urine can pass. Coordination between the bladder muscle squeezing down and the sphincter muscles relaxing is required to make everything happen normally. With an overactive bladder sometimes the muscles of the bladder contract unexpectedly and involuntarily and this causes an urgent need to urinate. The normal response is to try to hold urine in by contracting the sphincter muscles. Sometimes the bladder contracts so strongly that the sphincter muscles cannot stop the urine from passing out and incontinence occurs. This kind of incontinence is called urge incontinence. Having an overactive bladder can be embarrassing and awkward. It can keep you from living life the way you want to. Many people think it is just something you have to put up with as you grow older or have certain health conditions. In fact, there are treatments that can help make your life easier and more pleasant. CAUSES  Many  things can cause an overactive bladder. Possibilities include:  Urinary tract infection or infection of nearby tissues such as the prostate.  Prostate enlargement.  In women, multiple pregnancies or surgery on the uterus or urethra.  Bladder stones, inflammation or tumors.  Caffeine.  Alcohol.  Medications. For example, diuretics (drugs that help the body get rid of extra fluid) increase urine production. Some other medicines must be taken with lots of fluids.  Muscle or nerve weakness. This might be the result of a spinal cord injury, a stroke, multiple sclerosis or Parkinson's disease.  Diabetes can cause a high urine volume which fills the bladder so quickly that the normal urge to urinate is triggered very strongly. SYMPTOMS   Loss of bladder control. You feel the need to urinate and cannot make your body wait.  Sudden, strong urges to urinate.  Urinating 8 or more times a day.  Waking up to urinate two or more times a night. DIAGNOSIS  To decide if you have overactive bladder, your healthcare provider will probably:  Ask about symptoms you have noticed.  Ask about your overall health. This will include questions about any medications you are taking.  Do a physical examination. This will help determine if there are obvious blockages or other problems.  Order some tests. These might include:  A blood test to check for diabetes or other health issues that could be contributing to the problem.  Urine testing. This could measure the flow of urine and the pressure on the bladder.  A test of your neurological   system (the brain, spinal cord and nerves). This is the system that senses the need to urinate. Some of these tests are called flow tests, bladder pressure tests and electrical measurements of the sphincter muscle.  A bladder test to check whether it is emptying completely when you urinate.  Cytoscopy. This test uses a thin tube with a tiny camera on it. It offers a  look inside your urethra and bladder to see if there are problems.  Imaging tests. You might be given a contrast dye and then asked to urinate. X-rays are taken to see how your bladder is working. TREATMENT  An overactive bladder can be treated in many ways. The treatment will depend on the cause. Whether you have a mild or severe case also makes a difference. Often, treatment can be given in your healthcare provider's office or clinic. Be sure to discuss the different options with your caregiver. They include:  Behavioral treatments. These do not involve medication or surgery:  Bladder training. For this, you would follow a schedule to urinate at regular intervals. This helps you learn to control the urge to urinate. At first, you might be asked to wait a few minutes after feeling the urge. In time, you should be able to schedule bathroom visits an hour or more apart.  Kegel exercises. These exercises strengthen the pelvic floor muscles, which support the bladder. By toning these muscles, they can help control urination, even if the bladder muscles are overactive. A specialist will teach you how to do these exercises correctly. They will require daily practice.  Weight loss. If you are obese or overweight, losing weight might stop your bladder from being overactive. Talk to your healthcare provider about how many pounds you should lose. Also ask if there is a specific program or method that would work best for you.  Diet change. This might be suggested if constipation is making your overactive bladder worse. Your healthcare provider or a nutritionist can explain ways to change what you eat to ease constipation. Other people might need to take in less caffeine or alcohol. Sometimes drinking fewer fluids is needed, too.  Protection. This is not an actual treatment. But, you could wear special pads to take care of any leakage while you wait for other treatments to take effect. This will help you avoid  embarrassment.  Physical treatments.  Electrical stimulation. Electrodes will send gentle pulses to the nerves or muscles that help control the bladder. The goal is to strengthen them. Sometimes this is done with the electrodes outside of the body. Or, they might be placed inside the body (implanted). This treatment can take several months to have an effect.  Medications. These are usually used along with other treatments. Several medicines are available. Some are injected into the muscles involved in urination. Others come in pill form. Medications sometimes prescribed include:  Anticholinergics. These drugs block the signals that the nerves deliver to the bladder. This keeps it from releasing urine at the wrong time. Researchers think the drugs might help in other ways, too.  Imipramine. This is an antidepressant. But, it relaxes bladder muscles.  Botox. This is still experimental. Some people believe that injecting it into the bladder muscles will relax them so they work more normally. It has also been injected into the sphincter muscle when the sphincter muscle does not open properly. This is a temporary fix, however. Also, it might make matters worse, especially in older people.  Surgery.  A device might be implanted  to help manage your nerves. It works on the nerves that signal when you need to urinate.  Surgery is sometimes needed with electrical stimulation. If the electrodes are implanted, this is done through surgery.  Sometimes repairs need to be made through surgery. For example, the size of the bladder can be changed. This is usually done in severe cases only. HOME CARE INSTRUCTIONS   Take any medications your healthcare provider prescribed or suggested. Follow the directions carefully.  Practice any lifestyle changes that are recommended. These might include:  Drinking less fluid or drinking at different times of the day. If you need to urinate often during the night, for  example, you may need to stop drinking fluids early in the evening.  Cutting down on caffeine or alcohol. They can both make an overactive bladder worse. Caffeine is found in coffee, tea and sodas.  Doing Kegel exercises to strengthen muscles.  Losing weight, if that is recommended.  Eating a healthy and balanced diet. This will help you avoid constipation.  Keep a journal or a log. You might be asked to record how much you drink and when, and also when you feel the need to urinate.  Learn how to care for implants or other devices, such as pessaries. SEEK MEDICAL CARE IF:   Your overactive bladder gets worse.  You feel increased pain or irritation when you urinate.  You notice blood in your urine.  You have questions about any medications or devices that your healthcare provider recommended.  You notice blood, pus or swelling at the site of any test or treatment procedure.  You have an oral temperature above 102 F (38.9 C). SEEK IMMEDIATE MEDICAL CARE IF:  You have an oral temperature above 102 F (38.9 C), not controlled by medicine. Document Released: 02/20/2009 Document Revised: 07/19/2011 Document Reviewed: 02/20/2009 [employer] [facility] [employer] [facility] Patient Information 2014 [employer]. Mirabegron extended-release tablets What is this medicine? MIRABEGRON (MIR a BEG ron) is used to treat overactive bladder. This medicine reduces the amount of bathroom visits. It may also help to control wetting accidents. This medicine may be used for other purposes; ask your health care provider or pharmacist if you have questions. COMMON BRAND NAME(S): Myrbetriq What should I tell my health care provider before I take this medicine? They need to know if you have any of these conditions: -difficulty passing urine -high blood pressure -kidney disease -liver disease -an unusual or allergic reaction to mirabegron, other medicines, foods, dyes, or preservatives -pregnant or trying to get  pregnant -breast-feeding How should I use this medicine? Take this medicine by mouth with a glass of water. Follow the directions on the prescription label. Do not cut, crush or chew this medicine. You can take it with or without food. If it upsets your stomach, take it with food. Take your medicine at regular intervals. Do not take it more often than directed. Do not stop taking except on your doctor's advice. Talk to your pediatrician regarding the use of this medicine in children. Special care may be needed. Overdosage: If you think you've taken too much of this medicine contact a poison control center or emergency room at once. Overdosage: If you think you have taken too much of this medicine contact a poison control center or emergency room at once. NOTE: This medicine is only for you. Do not share this medicine with others. What if I miss a dose? If you miss a dose, take it as soon as you can. If it is almost  time for your next dose, take only that dose. Do not take double or extra doses. What may interact with this medicine? -certain medicines for bladder problems like fesoterodine, oxybutynin, solifenacin, tolterodine -desipramine -digoxin -flecainide -ketoconazole -MAOIs like Carbex, Eldepryl, Marplan, Nardil, and Parnate -metoprolol -propafenone -thioridazine -warfarin This list may not describe all possible interactions. Give your health care provider a list of all the medicines, herbs, non-prescription drugs, or dietary supplements you use. Also tell them if you smoke, drink alcohol, or use illegal drugs. Some items may interact with your medicine. What should I watch for while using this medicine? It may take 8 weeks to notice the full benefit from this medicine. You may need to limit your intake tea, coffee, caffeinated sodas, and alcohol. These drinks may make your symptoms worse. Visit your doctor or health care professional for regular checks on your progress. Check your  blood pressure as directed. Ask your doctor or health care professional what your blood pressure should be and when you should contact him or her. What side effects may I notice from receiving this medicine? Side effects that you should report to your doctor or health care professional as soon as possible: -allergic reactions like skin rash, itching or hives, swelling of the face, lips, or tongue -chest pain or palpitations -severe or sudden headache -high blood pressure -fast, irregular heartbeat -redness, blistering, peeling or loosening of the skin, including inside the mouth -signs of infection - fever or chills, pain or difficulty passing urine -trouble passing urine or change in the amount of urine Side effects that usually do not require medical attention (Report these to your doctor or health care professional if they continue or are bothersome.): -constipation -dry eyes -joint pain -mild headache -nausea -runny nose This list may not describe all possible side effects. Call your doctor for medical advice about side effects. You may report side effects to FDA at 1-800-FDA-1088. Where should I keep my medicine? Keep out of the reach of children. Store at room temperature between 15 and 30 degrees C (59 and 86 degrees F). Throw away any unused medicine after the expiration date. NOTE: This sheet is a summary. It may not cover all possible information. If you have questions about this medicine, talk to your doctor, pharmacist, or health care provider.  2014, [employer]. (2012-01-07 15:59:47)

## 2013-06-22 DIAGNOSIS — Z1231 Encounter for screening mammogram for malignant neoplasm of breast: Secondary | ICD-10-CM | POA: Diagnosis not present

## 2013-06-22 DIAGNOSIS — Z803 Family history of malignant neoplasm of breast: Secondary | ICD-10-CM | POA: Diagnosis not present

## 2013-06-24 ENCOUNTER — Encounter (HOSPITAL_COMMUNITY): Payer: Self-pay | Admitting: Emergency Medicine

## 2013-06-24 ENCOUNTER — Emergency Department (HOSPITAL_COMMUNITY)
Admission: EM | Admit: 2013-06-24 | Discharge: 2013-06-24 | Disposition: A | Discharge status: Home / Self Care | Payer: Medicare Other | Attending: Emergency Medicine | Admitting: Emergency Medicine

## 2013-06-24 ENCOUNTER — Emergency Department (HOSPITAL_COMMUNITY): Payer: Medicare Other

## 2013-06-24 DIAGNOSIS — IMO0001 Reserved for inherently not codable concepts without codable children: Secondary | ICD-10-CM | POA: Insufficient documentation

## 2013-06-24 DIAGNOSIS — Z8719 Personal history of other diseases of the digestive system: Secondary | ICD-10-CM | POA: Diagnosis not present

## 2013-06-24 DIAGNOSIS — I10 Essential (primary) hypertension: Secondary | ICD-10-CM | POA: Insufficient documentation

## 2013-06-24 DIAGNOSIS — Z7982 Long term (current) use of aspirin: Secondary | ICD-10-CM | POA: Diagnosis not present

## 2013-06-24 DIAGNOSIS — F329 Major depressive disorder, single episode, unspecified: Secondary | ICD-10-CM | POA: Diagnosis not present

## 2013-06-24 DIAGNOSIS — R61 Generalized hyperhidrosis: Secondary | ICD-10-CM | POA: Diagnosis not present

## 2013-06-24 DIAGNOSIS — R109 Unspecified abdominal pain: Secondary | ICD-10-CM

## 2013-06-24 DIAGNOSIS — R42 Dizziness and giddiness: Secondary | ICD-10-CM | POA: Diagnosis not present

## 2013-06-24 DIAGNOSIS — Z79899 Other long term (current) drug therapy: Secondary | ICD-10-CM | POA: Diagnosis not present

## 2013-06-24 DIAGNOSIS — M129 Arthropathy, unspecified: Secondary | ICD-10-CM | POA: Diagnosis not present

## 2013-06-24 DIAGNOSIS — R110 Nausea: Secondary | ICD-10-CM | POA: Insufficient documentation

## 2013-06-24 DIAGNOSIS — R011 Cardiac murmur, unspecified: Secondary | ICD-10-CM | POA: Diagnosis not present

## 2013-06-24 DIAGNOSIS — G43909 Migraine, unspecified, not intractable, without status migrainosus: Secondary | ICD-10-CM | POA: Insufficient documentation

## 2013-06-24 DIAGNOSIS — Z86711 Personal history of pulmonary embolism: Secondary | ICD-10-CM | POA: Diagnosis not present

## 2013-06-24 DIAGNOSIS — R000 Tachycardia, unspecified: Secondary | ICD-10-CM | POA: Diagnosis not present

## 2013-06-24 DIAGNOSIS — R002 Palpitations: Secondary | ICD-10-CM | POA: Insufficient documentation

## 2013-06-24 DIAGNOSIS — F3289 Other specified depressive episodes: Secondary | ICD-10-CM | POA: Insufficient documentation

## 2013-06-24 DIAGNOSIS — R1013 Epigastric pain: Secondary | ICD-10-CM | POA: Insufficient documentation

## 2013-06-24 LAB — CBC
HCT: 37.4 % (ref 36.0–46.0)
Hemoglobin: 13 g/dL (ref 12.0–15.0)
MCH: 33.2 pg (ref 26.0–34.0)
MCHC: 34.8 g/dL (ref 30.0–36.0)
MCV: 95.7 fL (ref 78.0–100.0)
PLATELETS: 254 10*3/uL (ref 150–400)
RBC: 3.91 MIL/uL (ref 3.87–5.11)
RDW: 14.5 % (ref 11.5–15.5)
WBC: 8.6 10*3/uL (ref 4.0–10.5)

## 2013-06-24 LAB — COMPREHENSIVE METABOLIC PANEL
ALBUMIN: 3.7 g/dL (ref 3.5–5.2)
ALK PHOS: 101 U/L (ref 39–117)
ALT: 15 U/L (ref 0–35)
AST: 22 U/L (ref 0–37)
BUN: 15 mg/dL (ref 6–23)
CHLORIDE: 98 meq/L (ref 96–112)
CO2: 26 mEq/L (ref 19–32)
Calcium: 8.8 mg/dL (ref 8.4–10.5)
Creatinine, Ser: 0.8 mg/dL (ref 0.50–1.10)
GFR calc Af Amer: >90 mL/min (ref >90)
GFR, EST NON AFRICAN AMERICAN: 83 mL/min — AB (ref >90)
Glucose, Bld: 105 mg/dL — ABNORMAL HIGH (ref 70–99)
POTASSIUM: 3.7 meq/L (ref 3.7–5.3)
Sodium: 140 mEq/L (ref 137–147)
Total Protein: 7.4 g/dL (ref 6.0–8.3)

## 2013-06-24 LAB — POCT I-STAT TROPONIN I
TROPONIN I, POC: 0.01 ng/mL (ref 0.00–0.08)
Troponin i, poc: 0 ng/mL (ref 0.00–0.08)

## 2013-06-24 LAB — LIPASE, BLOOD: Lipase: 39 U/L (ref 11–59)

## 2013-06-24 MED ORDER — ACETAMINOPHEN 325 MG PO TABS
650.0000 mg | ORAL_TABLET | Freq: Once | ORAL | Status: AC
Start: 2013-06-24 — End: 2013-06-24
  Administered 2013-06-24: 650 mg via ORAL
  Filled 2013-06-24: qty 2

## 2013-06-24 MED ORDER — PANTOPRAZOLE SODIUM 20 MG PO TBEC: 20.0000 mg | DELAYED_RELEASE_TABLET | Freq: Every day | ORAL | Status: DC

## 2013-06-24 MED ORDER — SODIUM CHLORIDE 0.9 % IV BOLUS (SEPSIS)
1000.0000 mL | Freq: Once | INTRAVENOUS | Status: AC
  Administered 2013-06-24: 1000 mL via INTRAVENOUS

## 2013-06-24 MED ORDER — KETOROLAC TROMETHAMINE 30 MG/ML IJ SOLN: 30.0000 mg | Freq: Once | INTRAMUSCULAR | Status: DC

## 2013-06-24 MED ORDER — SALINE SPRAY 0.65 % NA SOLN: 1.0000 | Freq: Once | NASAL | Status: DC

## 2013-06-24 MED ORDER — MORPHINE SULFATE 4 MG/ML IJ SOLN
4.0000 mg | Freq: Once | INTRAMUSCULAR | Status: AC
  Administered 2013-06-24: 4 mg via INTRAVENOUS
  Filled 2013-06-24: qty 1

## 2013-06-24 MED ORDER — GI COCKTAIL ~~LOC~~
30.0000 mL | Freq: Once | ORAL | Status: AC
  Administered 2013-06-24: 30 mL via ORAL
  Filled 2013-06-24: qty 30

## 2013-06-24 MED ORDER — ONDANSETRON HCL 4 MG/2ML IJ SOLN
4.0000 mg | Freq: Once | INTRAMUSCULAR | Status: AC
  Administered 2013-06-24: 4 mg via INTRAVENOUS
  Filled 2013-06-24: qty 2

## 2013-06-24 NOTE — ED Provider Notes (Signed)
CSN: [id number]     Arrival date & time 06/24/13  1139 History   First MD Initiated Contact with Patient 06/24/13 1148     Chief Complaint  Patient presents with  . Tachycardia     (Consider location/radiation/quality/duration/timing/severity/associated sxs/prior Treatment) HPI  This a 53 year old female with history of PE, cardiomyopathy, mitral valve and ASD repair who presents with palpitations, nausea, and dizziness.  Patient states that approximately 45 minutes prior to arrival she got hot and nauseated. She noted that her bilateral hands became tingly and she was having "palpitations." She reports pounding in her chest but no chest pain. She states that she subsequently got dizzy and felt like she was going pass out. She did not actually pass out. She now feels nauseous and has epigastric abdominal pain that is nonradiating. She describes it as crampy. It is currently 7/10. She's had no diarrhea. No known sick contacts. Patient did take an extra dose of 25 mg of her Coreg this morning following the episode of palpitations. She takes 25 mg twice a day normally.   Past Medical History  Diagnosis Date  . Depression   . Migraine   . Fibromyalgia   . Hearing impairment   . Arthritis   . Cardiomyopathy   . Pulmonary embolism   . Gastritis   . Hypertension    Past Surgical History  Procedure Laterality Date  . Appendectomy    . Inner ear surgery      TUBES  . Tonsillectomy and adenoidectomy    . Tubal ligation    . Laparoscopic cholecystectomy  2012  . Cardiac surgery  January 2007    mitral valve repair  . Cardiac surgery  1973    atrial septal defect   Family History  Problem Relation Age of Onset  . Cancer Mother   . Breast cancer Mother   . Depression Sister   . Heart disease Father   . Hypertension Father    History  Substance Use Topics  . Smoking status: Never Smoker   . Smokeless tobacco: Not on file  . Alcohol Use: No   OB History   Grav Para Term  Preterm Abortions TAB SAB Ect Mult Living   2 2 2       2      Review of Systems  Constitutional: Positive for diaphoresis. Negative for fever.  Respiratory: Negative for cough, chest tightness and shortness of breath.   Cardiovascular: Positive for palpitations. Negative for chest pain.  Gastrointestinal: Positive for nausea and abdominal pain. Negative for vomiting and diarrhea.  Genitourinary: Negative for dysuria.  Musculoskeletal: Negative for back pain.  Skin: Negative for wound.  Neurological: Positive for dizziness and light-headedness. Negative for syncope, weakness and headaches.  Psychiatric/Behavioral: Negative for confusion.  All other systems reviewed and are negative.      Allergies  Depakote and Valium  Home Medications   Current Outpatient Rx  Name  Route  Sig  Dispense  Refill  . ARIPiprazole (ABILIFY) 10 MG tablet   Oral   Take 10 mg by mouth daily.         [name] aspirin EC 81 MG tablet   Oral   Take 81 mg by mouth every morning.         [name] aspirin-acetaminophen-caffeine (EXCEDRIN MIGRAINE) 250-250-65 MG per tablet   Oral   Take 2 tablets by mouth daily as needed for headache.         . BuPROPion HCl ER, XL, (FORFIVO XL) 450  MG TB24   Oral   Take 450 mg by mouth every morning.         . butalbital-acetaminophen-caffeine (FIORICET, ESGIC) 50-325-40 MG per tablet   Oral   Take 1 tablet by mouth 2 (two) times daily as needed for headache.         . Calcium Carbonate-Vitamin D (CALCIUM 600 + D PO)   Oral   Take 1 tablet by mouth 2 (two) times daily.         . carvedilol (COREG) 25 MG tablet   Oral   Take 25 mg by mouth 2 (two) times daily with a meal.         . desvenlafaxine (PRISTIQ) 100 MG 24 hr tablet   Oral   Take 100 mg by mouth daily.         . Flaxseed, Linseed, (FLAXSEED OIL) 1000 MG CAPS   Oral   Take 1,000 mg by mouth 2 (two) times daily.         . hydrochlorothiazide (MICROZIDE) 12.5 MG capsule   Oral   Take 12.5  mg by mouth every morning.          . mirabegron ER (MYRBETRIQ) 25 MG TB24 tablet   Oral   Take 1 tablet (25 mg total) by mouth daily.   30 tablet   11   . Multiple Vitamin (MULTIVITAMIN) tablet   Oral   Take 1 tablet by mouth daily.          . pantoprazole (PROTONIX) 20 MG tablet   Oral   Take 1 tablet (20 mg total) by mouth daily.   30 tablet   0    BP 116/67  Pulse 74  Temp(Src) 98.4 F (36.9 C) (Oral)  Resp 16  Ht 5\' 5"  (1.651 m)  Wt 150 lb (68.04 kg)  BMI 24.96 kg/m2  SpO2 95% Physical Exam  Nursing note and vitals reviewed. Constitutional: She is oriented to person, place, and time. She appears well-developed and well-nourished. No distress.  HENT:  Head: Normocephalic and atraumatic.  Mouth/Throat: Oropharynx is clear and moist.  Eyes: Pupils are equal, round, and reactive to light.  Neck: Neck supple. No JVD present.  Cardiovascular: Normal rate and regular rhythm.   Murmur heard. Pulmonary/Chest: Effort normal and breath sounds normal. No respiratory distress. She has no wheezes.  Well healed sternotomy scar  Abdominal: Soft. Bowel sounds are normal. There is tenderness. There is no rebound and no guarding.  Epigastric tenderness to palpation without rebound or guarding  Musculoskeletal: She exhibits no edema.  Neurological: She is alert and oriented to person, place, and time.  Skin: Skin is warm and dry.  Psychiatric: She has a normal mood and affect.    ED Course  Procedures (including critical care time) Labs Review Labs Reviewed  COMPREHENSIVE METABOLIC PANEL - Abnormal; Notable for the following:    Glucose, Bld 105 (*)    Total Bilirubin <0.2 (*)    GFR calc non Af Amer 83 (*)    All other components within normal limits  CBC  LIPASE, BLOOD  POCT I-STAT TROPONIN I  POCT I-STAT TROPONIN I   Imaging Review Dg Chest 2 View  06/24/2013   CLINICAL DATA:  TACHYCARDIA  EXAM: CHEST  2 VIEW  COMPARISON:  DG RIBS UNILATERAL W/CHEST*R* dated  04/23/2013; DG CHEST 2 VIEW dated 11/11/2010  FINDINGS: Patient is status post median sternotomy and valve repair. Cardiac silhouette is otherwise unremarkable. New  IMPRESSION: No active  cardiopulmonary disease.   Electronically Signed   By: Margaree Mackintosh M.D.   On: 06/24/2013 12:54    EKG Interpretation   None      EKG independently reviewed by myself: Normal sinus rhythm with rate of 80, prolonged QT, no significant change from prior MDM   Final diagnoses:  Palpitations  Abdominal pain    Patient presents with palpitations, dizziness, nausea, and bilateral hand paresthesias. She now is reporting nausea and abdominal pain. She's not had any chest pain. She is nontoxic-appearing. EKG shows normal sinus rhythm with a rate of 80. Physical exam is largely benign.  Lab work initiated.  2:20 PM Patient reports improvement of abdominal pain with morphine. EKG is nonischemic.  Basic lab work including CMP and lipase are within normal limits. Chest x-ray is unchanged. We'll hold patient for a delta troponin. At this time have low suspicion for ACS given that her primary complaint upon arrival was abdominal pain. Her heart rate has been in the 80s. Blood pressures have been soft but she did take an extra dose of Coreg this morning. She is getting fluid resuscitation. We'll continue to monitor.  Patient symptoms improved with GI cocktail.  Delta trop is neg.  No evidence of tachycardia on the monitor.  Low suspicion ACS. Patient to call cardiologist tomorrow for further evaluation.  After history, exam, and medical workup I feel the patient has been appropriately medically screened and is safe for discharge home. Pertinent diagnoses were discussed with the patient. Patient was given return precautions.   Merryl Hacker, MD 06/24/13 2231

## 2013-06-24 NOTE — Discharge Instructions (Signed)
You were seen today for palpitations and abdominal pain. Her workup is reassuring. He had improvement of her pain with acid reducing medication. Your cardiac evaluation is reassuring as well. However, I cannot fully rule out cardiac etiology of her symptoms. He need to follow up with your cardiologist for further evaluation and possible stress testing. He should return if he has any new or worsening symptoms.  Abdominal Pain, Adult Many things can cause abdominal pain. Usually, abdominal pain is not caused by a disease and will improve without treatment. It can often be observed and treated at home. Your health care provider will do a physical exam and possibly order blood tests and X-rays to help determine the seriousness of your pain. However, in many cases, more time must pass before a clear cause of the pain can be found. Before that point, your health care provider may not know if you need more testing or further treatment. HOME CARE INSTRUCTIONS  Monitor your abdominal pain for any changes. The following actions may help to alleviate any discomfort you are experiencing:  Only take over-the-counter or prescription medicines as directed by your health care provider.  Do not take laxatives unless directed to do so by your health care provider.  Try a clear liquid diet (broth, tea, or water) as directed by your health care provider. Slowly move to a bland diet as tolerated. SEEK MEDICAL CARE IF:  You have unexplained abdominal pain.  You have abdominal pain associated with nausea or diarrhea.  You have pain when you urinate or have a bowel movement.  You experience abdominal pain that wakes you in the night.  You have abdominal pain that is worsened or improved by eating food.  You have abdominal pain that is worsened with eating fatty foods. SEEK IMMEDIATE MEDICAL CARE IF:   Your pain does not go away within 2 hours.  You have a fever.  You keep throwing up (vomiting).  Your pain  is felt only in portions of the abdomen, such as the right side or the left lower portion of the abdomen.  You pass bloody or black tarry stools. MAKE SURE YOU:  Understand these instructions.   Will watch your condition.   Will get help right away if you are not doing well or get worse.  Document Released: 02/03/2005 Document Revised: 02/14/2013 Document Reviewed: 01/03/2013 [facility] Patient Information 2014 [place]. Palpitations  A palpitation is the feeling that your heartbeat is irregular or is faster than normal. It may feel like your heart is fluttering or skipping a beat. Palpitations are usually not a serious problem. However, in some cases, you may need further medical evaluation. CAUSES  Palpitations can be caused by:  Smoking.  Caffeine or other stimulants, such as diet pills or energy drinks.  Alcohol.  Stress and anxiety.  Strenuous physical activity.  Fatigue.  Certain medicines.  Heart disease, especially if you have a history of arrhythmias. This includes atrial fibrillation, atrial flutter, or supraventricular tachycardia.  An improperly working pacemaker or defibrillator. DIAGNOSIS  To find the cause of your palpitations, your caregiver will take your history and perform a physical exam. Tests may also be done, including:  Electrocardiography (ECG). This test records the heart's electrical activity.  Cardiac monitoring. This allows your caregiver to monitor your heart rate and rhythm in real time.  Holter monitor. This is a portable device that records your heartbeat and can help diagnose heart arrhythmias. It allows your caregiver to track your heart activity for  several days, if needed.  Stress tests by exercise or by giving medicine that makes the heart beat faster. TREATMENT  Treatment of palpitations depends on the cause of your symptoms and can vary greatly. Most cases of palpitations do not require any treatment other than time,  relaxation, and monitoring your symptoms. Other causes, such as atrial fibrillation, atrial flutter, or supraventricular tachycardia, usually require further treatment. HOME CARE INSTRUCTIONS   Avoid:  Caffeinated coffee, tea, soft drinks, diet pills, and energy drinks.  Chocolate.  Alcohol.  Stop smoking if you smoke.  Reduce your stress and anxiety. Things that can help you relax include:  A method that measures bodily functions so you can learn to control them (biofeedback).  Yoga.  Meditation.  Physical activity such as swimming, jogging, or walking.  Get plenty of rest and sleep. SEEK MEDICAL CARE IF:   You continue to have a fast or irregular heartbeat beyond 24 hours.  Your palpitations occur more often. SEEK IMMEDIATE MEDICAL CARE IF:  You develop chest pain or shortness of breath.  You have a severe headache.  You feel dizzy, or you faint. MAKE SURE YOU:  Understand these instructions.  Will watch your condition.  Will get help right away if you are not doing well or get worse. Document Released: 04/23/2000 Document Revised: 08/21/2012 Document Reviewed: 06/25/2011 [facility] Patient Information 2014 [place].

## 2013-06-24 NOTE — ED Notes (Addendum)
Today she was resting and she suddenly felt like her heart was racing, and then she felt dizzy like she might pass out and her hands felt numb. She took an extra dose of her coreg and her heart "seemed to slow down after that."A&Ox4, resp e/u. C/o abd pain now

## 2013-06-24 NOTE — ED Notes (Addendum)
Patient C/O having palpitations and nausea this AM.  States that she also got dizzy and her fingers began to tingle. States that she almost fainted but did not.  Patient now complains of abdominal pain at present.  Monitor shows NSR at 81.

## 2013-06-27 ENCOUNTER — Other Ambulatory Visit (HOSPITAL_COMMUNITY): Payer: Self-pay | Admitting: *Deleted

## 2013-06-27 DIAGNOSIS — F321 Major depressive disorder, single episode, moderate: Secondary | ICD-10-CM

## 2013-06-27 MED ORDER — DESVENLAFAXINE SUCCINATE ER 100 MG PO TB24: 100.0000 mg | ORAL_TABLET | Freq: Every day | ORAL | Status: DC

## 2013-06-27 MED ORDER — ARIPIPRAZOLE 10 MG PO TABS: 10.0000 mg | ORAL_TABLET | Freq: Every day | ORAL | Status: DC

## 2013-06-27 NOTE — Telephone Encounter (Signed)
Refill request for 90 day RX for Pristiq and Abilify. 90 day authorized by Dr. Martina Sinner

## 2013-06-28 ENCOUNTER — Encounter: Payer: Self-pay | Admitting: Gynecology

## 2013-07-02 ENCOUNTER — Encounter: Payer: Self-pay | Admitting: Cardiology

## 2013-07-02 ENCOUNTER — Ambulatory Visit (INDEPENDENT_AMBULATORY_CARE_PROVIDER_SITE_OTHER): Payer: Medicare Other | Admitting: Cardiology

## 2013-07-02 VITALS — BP 110/80 | HR 85 | Ht 65.0 in | Wt 146.0 lb

## 2013-07-02 DIAGNOSIS — Z9889 Other specified postprocedural states: Secondary | ICD-10-CM

## 2013-07-02 DIAGNOSIS — E785 Hyperlipidemia, unspecified: Secondary | ICD-10-CM | POA: Insufficient documentation

## 2013-07-02 DIAGNOSIS — R002 Palpitations: Secondary | ICD-10-CM

## 2013-07-02 DIAGNOSIS — Z8774 Personal history of (corrected) congenital malformations of heart and circulatory system: Secondary | ICD-10-CM

## 2013-07-02 LAB — BASIC METABOLIC PANEL
BUN: 23 mg/dL (ref 6–23)
CALCIUM: 10.6 mg/dL — AB (ref 8.4–10.5)
CO2: 29 mEq/L (ref 19–32)
CREATININE: 1 mg/dL (ref 0.4–1.2)
Chloride: 100 mEq/L (ref 96–112)
GFR: 63.16 mL/min (ref >60.00)
Glucose, Bld: 101 mg/dL — ABNORMAL HIGH (ref 70–99)
Potassium: 4.1 mEq/L (ref 3.5–5.1)
Sodium: 136 mEq/L (ref 135–145)

## 2013-07-02 NOTE — Progress Notes (Signed)
[place], [address] [place], [place]  [address] Phone: [phone] Fax:  [phone]  Date:  07/02/2013   ID:  [name], DOB 02-Apr-1961, MRN [id number]  PCP:  Tawanna Solo, MD   History of Present Illness: [name] is a 53 y.o. 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 She felt dizzy and felt as though she was going to pass out, hands were numb. She had nausea as well as epigastric abdominal discomfort, nonradiating, crampy. 7/10. She took an extra dose of her carvedilol 25 mg during episode of palpitations.chest x-ray unremarkable. EKG on 06/24/13 showed nonspecific ST-T wave changes, sinus rhythm, borderline QT prolongation. Once she ate she felt better.    Wt Readings from Last 3 Encounters:  07/02/13 146 lb (66.225 kg)  06/24/13 150 lb (68.04 kg)  02/06/13 143 lb 3.2 oz (64.955 kg)     Past Medical History  Diagnosis Date  . Depression   . Migraine   . Fibromyalgia   . Hearing impairment   . Arthritis   . Cardiomyopathy   . Pulmonary embolism   . Gastritis   . Hypertension     Past Surgical History  Procedure Laterality Date  . Appendectomy    . Inner ear surgery      TUBES  . Tonsillectomy and adenoidectomy    . Tubal ligation    . Laparoscopic cholecystectomy  2012  . Cardiac surgery  January 2007    mitral valve repair  . Cardiac surgery  1973    atrial septal defect    Current Outpatient Prescriptions  Medication Sig Dispense Refill  . ARIPiprazole (ABILIFY) 10 MG tablet Take 1 tablet (10 mg total) by mouth daily.  90 tablet  0  . aspirin EC 81 MG tablet Take 81 mg by mouth every morning.      [name] aspirin-acetaminophen-caffeine (EXCEDRIN MIGRAINE) 250-250-65 MG per tablet Take 2 tablets by mouth daily as needed for headache.      . BuPROPion HCl  ER, XL, (FORFIVO XL) 450 MG TB24 Take 450 mg by mouth every morning.      . butalbital-acetaminophen-caffeine (FIORICET, ESGIC) 50-325-40 MG per tablet Take 1 tablet by mouth 2 (two) times daily as needed for headache.      . Calcium Carbonate-Vitamin D (CALCIUM 600 + D PO) Take 1 tablet by mouth 2 (two) times daily.      . carvedilol (COREG) 25 MG tablet Take 25 mg by mouth 2 (two) times daily with a meal.      . desvenlafaxine (PRISTIQ) 100 MG 24 hr tablet Take 1 tablet (100 mg total) by mouth daily.  90 tablet  0  . Flaxseed, Linseed, (FLAXSEED OIL) 1000 MG CAPS Take 1,000 mg by mouth 2 (two) times daily.      . hydrochlorothiazide (MICROZIDE) 12.5 MG capsule Take 12.5 mg by mouth every morning.       . mirabegron ER (MYRBETRIQ) 25 MG TB24 tablet Take 1 tablet (25 mg total) by mouth daily.  30 tablet  11  . Multiple Vitamin (MULTIVITAMIN) tablet Take 1 tablet by mouth daily.       . pantoprazole (PROTONIX) 20 MG tablet Take 1 tablet (20 mg total) by mouth daily.  30 tablet  0   No current facility-administered medications  for this visit.    Allergies:    Allergies  Allergen Reactions  . Depakote [Divalproex Sodium] Anaphylaxis  . Valium Anaphylaxis    Social History:  The patient  reports that she has never smoked. She does not have any smokeless tobacco history on file. She reports that she does not drink alcohol or use illicit drugs.   ROS:  Please see the history of present illness.   Denies any fevers, chills, orthopnea, PND, no vomiting    PHYSICAL EXAM: VS:  BP 110/80  Pulse 85  Ht 5\' 5"  (1.651 m)  Wt 146 lb (66.225 kg)  BMI 24.30 kg/m2  SpO2 99% Well nourished, well developed, in no acute distress HEENT: normal Neck: no JVD Cardiac:  normal S1, S2; RRR; soft systolic murmur apex. Lungs:  clear to auscultation bilaterally, no wheezing, rhonchi or rales Abd: soft, nontender, no hepatomegaly Ext: no edema Skin: warm and dry Neuro: no focal abnormalities noted  EKG:   As above   Echocardiogram: 08/2011 -Normal ejection fraction -Mild mitral regurgitation, prior mitral valve repair -Intact ASD repair  ASSESSMENT AND PLAN:  1. Palpitations-no adverse arrhythmias detected in the emergency department. EKG unremarkable. Continuing with beta blocker. May have been anxiety, hunger provoking discomfort. After she ate, her symptoms resolved. Her tingling in her hands bilaterally he may have been hyperventilation. 2. Mitral valve repair, ASD repair-stable.echocardiogram 2013 reviewed 3. Hyperlipidemia-we will check lipid panel today. 4. I will see her back in 6 months  Signed, Candee Furbish, MD [facility]  07/02/2013 1:22 PM

## 2013-07-02 NOTE — Patient Instructions (Signed)
Your physician recommends that you continue on your current medications as directed. Please refer to the Current Medication list given to you today.  Your physician recommends that you have labs today: Lipid  Your physician wants you to follow-up in: 6 months with Dr. Dawna Part will receive a reminder letter in the mail two months in advance. If you don't receive a letter, please call our office to schedule the follow-up appointment.

## 2013-07-13 ENCOUNTER — Telehealth: Payer: Self-pay | Admitting: Genetic Counselor

## 2013-07-13 ENCOUNTER — Ambulatory Visit (INDEPENDENT_AMBULATORY_CARE_PROVIDER_SITE_OTHER): Payer: Medicare Other | Admitting: Gynecology

## 2013-07-13 ENCOUNTER — Telehealth: Payer: Self-pay | Admitting: *Deleted

## 2013-07-13 ENCOUNTER — Encounter: Payer: Self-pay | Admitting: Gynecology

## 2013-07-13 VITALS — BP 120/86 | Ht 61.25 in | Wt 151.0 lb

## 2013-07-13 DIAGNOSIS — Z01419 Encounter for gynecological examination (general) (routine) without abnormal findings: Secondary | ICD-10-CM | POA: Diagnosis not present

## 2013-07-13 DIAGNOSIS — Z8781 Personal history of (healed) traumatic fracture: Secondary | ICD-10-CM | POA: Diagnosis not present

## 2013-07-13 DIAGNOSIS — M810 Age-related osteoporosis without current pathological fracture: Secondary | ICD-10-CM | POA: Diagnosis not present

## 2013-07-13 DIAGNOSIS — Z8639 Personal history of other endocrine, nutritional and metabolic disease: Secondary | ICD-10-CM

## 2013-07-13 DIAGNOSIS — N318 Other neuromuscular dysfunction of bladder: Secondary | ICD-10-CM | POA: Diagnosis not present

## 2013-07-13 DIAGNOSIS — Z780 Asymptomatic menopausal state: Secondary | ICD-10-CM

## 2013-07-13 DIAGNOSIS — Z1159 Encounter for screening for other viral diseases: Secondary | ICD-10-CM

## 2013-07-13 DIAGNOSIS — Z803 Family history of malignant neoplasm of breast: Secondary | ICD-10-CM | POA: Insufficient documentation

## 2013-07-13 DIAGNOSIS — N951 Menopausal and female climacteric states: Secondary | ICD-10-CM

## 2013-07-13 DIAGNOSIS — Z86718 Personal history of other venous thrombosis and embolism: Secondary | ICD-10-CM

## 2013-07-13 DIAGNOSIS — E782 Mixed hyperlipidemia: Secondary | ICD-10-CM | POA: Diagnosis not present

## 2013-07-13 DIAGNOSIS — N3281 Overactive bladder: Secondary | ICD-10-CM

## 2013-07-13 LAB — COMPREHENSIVE METABOLIC PANEL
ALT: 15 U/L (ref 0–35)
AST: 18 U/L (ref 0–37)
Albumin: 4.3 g/dL (ref 3.5–5.2)
Alkaline Phosphatase: 78 U/L (ref 39–117)
BUN: 19 mg/dL (ref 6–23)
CHLORIDE: 105 meq/L (ref 96–112)
CO2: 26 meq/L (ref 19–32)
Calcium: 9.4 mg/dL (ref 8.4–10.5)
Creat: 0.82 mg/dL (ref 0.50–1.10)
Glucose, Bld: 95 mg/dL (ref 70–99)
Potassium: 3.9 mEq/L (ref 3.5–5.3)
Sodium: 141 mEq/L (ref 135–145)
TOTAL PROTEIN: 6.8 g/dL (ref 6.0–8.3)
Total Bilirubin: 0.2 mg/dL (ref 0.2–1.2)

## 2013-07-13 LAB — BUN: BUN: 19 mg/dL (ref 6–23)

## 2013-07-13 LAB — CBC WITH DIFFERENTIAL/PLATELET
Basophils Absolute: 0.1 10*3/uL (ref 0.0–0.1)
Basophils Relative: 2 % — ABNORMAL HIGH (ref 0–1)
Eosinophils Absolute: 0.1 10*3/uL (ref 0.0–0.7)
Eosinophils Relative: 4 % (ref 0–5)
HCT: 38.1 % (ref 36.0–46.0)
Hemoglobin: 12.8 g/dL (ref 12.0–15.0)
LYMPHS ABS: 0.7 10*3/uL (ref 0.7–4.0)
LYMPHS PCT: 20 % (ref 12–46)
MCH: 32.4 pg (ref 26.0–34.0)
MCHC: 33.6 g/dL (ref 30.0–36.0)
MCV: 96.5 fL (ref 78.0–100.0)
Monocytes Absolute: 0.2 10*3/uL (ref 0.1–1.0)
Monocytes Relative: 6 % (ref 3–12)
NEUTROS ABS: 2.5 10*3/uL (ref 1.7–7.7)
Neutrophils Relative %: 68 % (ref 43–77)
PLATELETS: 218 10*3/uL (ref 150–400)
RBC: 3.95 MIL/uL (ref 3.87–5.11)
RDW: 14.5 % (ref 11.5–15.5)
WBC: 3.7 10*3/uL — AB (ref 4.0–10.5)

## 2013-07-13 LAB — CALCIUM: Calcium: 9.4 mg/dL (ref 8.4–10.5)

## 2013-07-13 LAB — CREATININE, SERUM: Creat: 0.82 mg/dL (ref 0.50–1.10)

## 2013-07-13 LAB — CHOLESTEROL, TOTAL: CHOLESTEROL: 261 mg/dL — AB (ref 0–200)

## 2013-07-13 MED ORDER — FLUCONAZOLE 150 MG PO TABS: 150.0000 mg | ORAL_TABLET | Freq: Once | ORAL | Status: DC

## 2013-07-13 NOTE — Telephone Encounter (Signed)
Referral faxed to [facility], they will contact patient to schedule. 

## 2013-07-13 NOTE — Progress Notes (Signed)
[name] Nov 05, 1960 [id number]   History:    53 y.o. who presented to the office today for her annual gynecological exam. Patient is asymptomatic today. She is menopausal. Patient has never been on hormone replacement therapy. Patient with history of DVT at time of her pregnancy back in 2003 requiring anti-coagulation.  Patient in 2014 had been diagnosed with spontaneous L1 vertebral fracture and had been followed by the spine specialists Dr. Hazle Coca who placed her on a back brace and gave her pain. Patient's lumbar spine report by the radiologist at that time that demonstrated the following:  There are five lumbar type vertebral bodies. The  alignment is stable with a convex left scoliosis. There is a new  superior endplate compression fracture at L1 with less than 20%  loss of vertebral body height. No osseous retropulsion or widening  of the interpedicular distance is identified. No other acute  osseous findings are identified. The advanced degenerative disc  disease and endplate sclerosis at Y8-A1 are stable. Lesser  endplate degenerative changes at L4-L5 and fragmentation of the  anterior-superior corner of the L4 vertebral body are unchanged.  Patient subsequently had a bone density study on 07/26/2012 with the following result:  AP spine T score of -1.3  Right femoral neck T score of -2.1  A Frax analysis was not done because of her recently found vertebral fracture   I then started her on Reclast in April 2014 and has done well with the medication with no side effects. Patient last year also had a low vitamin D level with a value of 27 and had been prescribed vitamin D 50,000 units weekly.  Patient with history of cardiomyopathy (ASD repair as a child) is being followed by her cardiologist a regular basis. Patient is hearing impaired. Patient with prior tubal sterilization procedure.  The patient has informed me that she has several family members with history of  breast cancer. Her mother had breast cancer at the age of 37 and 2 of her aunts. Her great-grandmother had ovarian and breast cancer as well.  Patient last year was diagnosed with overactive bladder and was started on Myrbetriq which has helped her tremendously.  Patient with past history of ovarian cysts had a followup ultrasound earlier this year an ovarian cyst that result.  Past medical history,surgical history, family history and social history were all reviewed and documented in the EPIC chart.  Gynecologic History No LMP recorded. Patient is postmenopausal. Contraception: post menopausal status Last Pap: 2014. Results were: normal Last mammogram: 2015. Results were: normal  Obstetric History OB History  Gravida Para Term Preterm AB SAB TAB Ectopic Multiple Living  _0 # Outcome Date GA Lbr Len/2nd Weight Sex Delivery Anes PTL Lv  2 TRM           1 TRM                ROS: A ROS was performed and pertinent positives and negatives are included in the history.  GENERAL: No fevers or chills. HEENT: No change in vision, no earache, sore throat or sinus congestion. NECK: No pain or stiffness. CARDIOVASCULAR: No chest pain or pressure. No palpitations. PULMONARY: No shortness of breath, cough or wheeze. GASTROINTESTINAL: No abdominal pain, nausea, vomiting or diarrhea, melena or bright red blood per rectum. GENITOURINARY: No urinary frequency, urgency, hesitancy or dysuria. MUSCULOSKELETAL: No joint or muscle pain, no back pain, no  recent trauma. DERMATOLOGIC: No rash, no itching, no lesions. ENDOCRINE: No polyuria, polydipsia, no heat or cold intolerance. No recent change in weight. HEMATOLOGICAL: No anemia or easy bruising or bleeding. NEUROLOGIC: No headache, seizures, numbness, tingling or weakness. PSYCHIATRIC: No depression, no loss of interest in normal activity or change in sleep pattern.     Exam: chaperone present  BP 120/86  Ht 5' 1.25" (1.556 m)  Wt 151 lb  (68.493 kg)  BMI 28.29 kg/m2  Body mass index is 28.29 kg/(m^2).  General appearance : Well developed well nourished female. No acute distress HEENT: Neck supple, trachea midline, no carotid bruits, no thyroidmegaly Lungs: Clear to auscultation, no rhonchi or wheezes, or rib retractions  Heart: Regular rate and rhythm, no murmurs or gallops Breast:Examined in sitting and supine position were symmetrical in appearance, no palpable masses or tenderness,  no skin retraction, no nipple inversion, no nipple discharge, no skin discoloration, no axillary or supraclavicular lymphadenopathy Abdomen: no palpable masses or tenderness, no rebound or guarding Extremities: no edema or skin discoloration or tenderness  Pelvic:  Bartholin, Urethra, Skene Glands: Within normal limits             Vagina: No gross lesions or discharge  Cervix: No gross lesions or discharge  Uterus  anteverted, normal size, shape and consistency, non-tender and mobile  Adnexa  Without masses or tenderness  Anus and perineum  normal   Rectovaginal  normal sphincter tone without palpated masses or tenderness             Hemoccult wil provide     Assessment/Plan:  53 y.o. female for annual exam who was started on Reclast IV once a year in April 2014 as a result of vertebral fracture nontraumatic. Patient with past history vitamin D deficiency. Patient taking calcium and vitamin D on a regular basis. Patient will need a followup bone density study next year. Patient will be referred to the geneticist for counseling and testing for BRCA1 and BRCA2 gene mutation as a result of strong family history of breast cancer. She was reminded to do her monthly breast exam. Patient also will be referred to gastroenterologist for her colonoscopy. The following labs were ordered today: BUN, creatinine, comprehensive metabolic panel, vitamin D, screening cholesterol, CBC, TSH, and urinalysis. Pap smear was not done today.  New CDC guidelines is  recommending patients be tested once in her lifetime for hepatitis C antibody who were born between [birth year] through 1965. This was discussed with the patient today and has agreed to be tested today.  Note: This dictation was prepared with  Dragon/digital dictation along withSmart phrase technology. Any transcriptional errors that result from this process are unintentional.   Terrance Mass MD, 9:42 AM 07/13/2013

## 2013-07-13 NOTE — Patient Instructions (Signed)
BRCA-1 and BRCA-2 BRCA-1 and BRCA-2 are 2 genes that are linked with hereditary breast and ovarian cancers. About 200,000 women are diagnosed with invasive breast cancer each year and about 23,000 with ovarian cancer (according to the American Cancer Society). Of these cancers, about 5% to 10% will be due to a mutation in one of the BRCA genes. Men can also inherit an increased risk of developing breast cancer, primarily from an alteration in the BRCA-2 gene.  Individuals with mutations in BRCA1 or BRCA2 have significantly elevated risks for breast cancer (up to 80% lifetime risk), ovarian cancer (up to 40% lifetime risk), bilateral breast cancer and other types of cancers. BRCA mutations are inherited and passed from generation to generation. One half of the time, they are passed from the father's side of the family.  The DNA in white blood cells is used to detect mutations in the BRCA genes. While the gene products (proteins) of the BRCA genes act only in breast and ovarian tissue, the genes are present in every cell of the body and blood is the most easily accessible source of that DNA. PREPARATION FOR TEST The test for BRCA mutations is done on a blood sample collected by needle from a vein in the arm. The test does not require surgical biopsy of breast or ovarian tissue.  NORMAL FINDINGS No genetic mutations. Ranges for normal findings may vary among different laboratories and hospitals. You should always check with your doctor after having lab work or other tests done to discuss the meaning of your test results and whether your values are considered within normal limits. MEANING OF TEST  Your caregiver will go over the test results with you and discuss the importance and meaning of your results, as well as treatment options and the need for additional tests if necessary. OBTAINING THE TEST RESULTS It is your responsibility to obtain your test results. Ask the lab or department performing the test  when and how you will get your results. OTHER THINGS TO KNOW Your test results may have implications for other family members. When one member of a family is tested for BRCA mutations, issues often arise about how or whether to share this information with other family members. Seek advice from a genetic counselor about communication of result with your family members.  Pre and post test consultation with a health care provider knowledgeable about genetic testing cannot be overemphasized.  There are many issues to be considered when preparing for a genetic test and upon learning the results, and a genetic counselor has the knowledge and experience to help you sort through them.  If the BRCA test is positive, the options include increased frequency of check-ups (e.g., mammography, blood tests for CA-125, or transvaginal ultrasonography); medications that could reduce risk (e.g., oral contraceptives or tamoxifen); or surgical removal of the ovaries or breasts. There are a number of variables involved and it is important to discuss your options with your doctor and genetic counselor. Research studies have reported that for every 1000 women negative for BRCA mutations, between 12 and 45 of them will develop breast cancer by age 50 and between 3 and 4 will develop ovarian cancer by age 50. The risk increases with age. The test can be ordered by a doctor, preferably by one who can also offer genetic counseling. The blood sample will be sent to a laboratory that specializes in BRCA testing. The American Society of Clinical Oncology and the National Breast Cancer Coalition encourage women seeking the   test to participate in long-term outcome studies to help gather information on the effectiveness of different check-up and treatment options. Document Released: 05/20/2004 Document Revised: 07/19/2011 Document Reviewed: 04/01/2008 [facility] Patient Information 2014 [place], [place]. Colonoscopy A colonoscopy is an exam  to look at the entire large intestine (colon). This exam can help find problems such as tumors, polyps, inflammation, and areas of bleeding. The exam takes about 1 hour.  LET [facility] CARE PROVIDER KNOW ABOUT:   Any allergies you have.  All medicines you are taking, including vitamins, herbs, eye drops, creams, and over-the-counter medicines.  Previous problems you or members of your family have had with the use of anesthetics.  Any blood disorders you have.  Previous surgeries you have had.  Medical conditions you have. RISKS AND COMPLICATIONS  Generally, this is a safe procedure. However, as with any procedure, complications can occur. Possible complications include:  Bleeding.  Tearing or rupture of the colon wall.  Reaction to medicines given during the exam.  Infection (rare). BEFORE THE PROCEDURE   Ask your health care provider about changing or stopping your regular medicines.  You may be prescribed an oral bowel prep. This involves drinking a large amount of medicated liquid, starting the day before your procedure. The liquid will cause you to have multiple loose stools until your stool is almost clear or light green. This cleans out your colon in preparation for the procedure.  Do not eat or drink anything else once you have started the bowel prep, unless your health care provider tells you it is safe to do so.  Arrange for someone to drive you home after the procedure. PROCEDURE   You will be given medicine to help you relax (sedative).  You will lie on your side with your knees bent.  A long, flexible tube with a light and camera on the end (colonoscope) will be inserted through the rectum and into the colon. The camera sends video back to a computer screen as it moves through the colon. The colonoscope also releases carbon dioxide gas to inflate the colon. This helps your health care provider see the area better.  During the exam, your health care provider may  take a small tissue sample (biopsy) to be examined under a microscope if any abnormalities are found.  The exam is finished when the entire colon has been viewed. AFTER THE PROCEDURE   Do not drive for 24 hours after the exam.  You may have a small amount of blood in your stool.  You may pass moderate amounts of gas and have mild abdominal cramping or bloating. This is caused by the gas used to inflate your colon during the exam.  Ask when your test results will be ready and how you will get your results. Make sure you get your test results. Document Released: 04/23/2000 Document Revised: 02/14/2013 Document Reviewed: 01/01/2013 [facility] Patient Information 2014 [place].

## 2013-07-13 NOTE — Telephone Encounter (Signed)
Message copied by Thamas Jaegers on Fri Jul 13, 2013 10:02 AM ------      Message from: Terrance Mass      Created: Fri Jul 13, 2013  9:31 AM       Please schedule appointment at [facility] for [facility]ing for Brca testing in this patient with strong family history of breast cancer  ------

## 2013-07-13 NOTE — Telephone Encounter (Signed)
LEFT MESSAGE FOR PATIENT TO RETURN CALL TO SCHEDULE GENETIC APPT.  °

## 2013-07-14 LAB — URINALYSIS W MICROSCOPIC + REFLEX CULTURE
Bacteria, UA: NONE SEEN
Bilirubin Urine: NEGATIVE
CASTS: NONE SEEN
Crystals: NONE SEEN
GLUCOSE, UA: NEGATIVE mg/dL
Ketones, ur: NEGATIVE mg/dL
Leukocytes, UA: NEGATIVE
Nitrite: NEGATIVE
PH: 6 (ref 5.0–8.0)
PROTEIN: NEGATIVE mg/dL
SQUAMOUS EPITHELIAL / LPF: NONE SEEN
Specific Gravity, Urine: 1.021 (ref 1.005–1.030)
Urobilinogen, UA: 0.2 mg/dL (ref 0.0–1.0)

## 2013-07-14 LAB — HEPATITIS C ANTIBODY: HCV Ab: NEGATIVE

## 2013-07-14 LAB — TSH: TSH: 6.14 u[IU]/mL — ABNORMAL HIGH (ref 0.350–4.500)

## 2013-07-14 LAB — VITAMIN D 25 HYDROXY (VIT D DEFICIENCY, FRACTURES): Vit D, 25-Hydroxy: 52 ng/mL (ref 30–89)

## 2013-07-16 ENCOUNTER — Emergency Department (HOSPITAL_COMMUNITY)
Admission: EM | Admit: 2013-07-16 | Discharge: 2013-07-16 | Disposition: A | Discharge status: Home / Self Care | Payer: Medicare Other | Attending: Emergency Medicine | Admitting: Emergency Medicine

## 2013-07-16 ENCOUNTER — Emergency Department (HOSPITAL_COMMUNITY): Payer: Medicare Other

## 2013-07-16 ENCOUNTER — Encounter (HOSPITAL_COMMUNITY): Payer: Self-pay | Admitting: Emergency Medicine

## 2013-07-16 DIAGNOSIS — Z9089 Acquired absence of other organs: Secondary | ICD-10-CM | POA: Diagnosis not present

## 2013-07-16 DIAGNOSIS — M810 Age-related osteoporosis without current pathological fracture: Secondary | ICD-10-CM | POA: Diagnosis not present

## 2013-07-16 DIAGNOSIS — Z7982 Long term (current) use of aspirin: Secondary | ICD-10-CM | POA: Insufficient documentation

## 2013-07-16 DIAGNOSIS — M129 Arthropathy, unspecified: Secondary | ICD-10-CM | POA: Diagnosis not present

## 2013-07-16 DIAGNOSIS — Z9851 Tubal ligation status: Secondary | ICD-10-CM | POA: Diagnosis not present

## 2013-07-16 DIAGNOSIS — R197 Diarrhea, unspecified: Secondary | ICD-10-CM | POA: Diagnosis not present

## 2013-07-16 DIAGNOSIS — IMO0001 Reserved for inherently not codable concepts without codable children: Secondary | ICD-10-CM | POA: Diagnosis not present

## 2013-07-16 DIAGNOSIS — Z9889 Other specified postprocedural states: Secondary | ICD-10-CM | POA: Insufficient documentation

## 2013-07-16 DIAGNOSIS — I10 Essential (primary) hypertension: Secondary | ICD-10-CM | POA: Diagnosis not present

## 2013-07-16 DIAGNOSIS — G43909 Migraine, unspecified, not intractable, without status migrainosus: Secondary | ICD-10-CM | POA: Insufficient documentation

## 2013-07-16 DIAGNOSIS — R42 Dizziness and giddiness: Secondary | ICD-10-CM | POA: Insufficient documentation

## 2013-07-16 DIAGNOSIS — K529 Noninfective gastroenteritis and colitis, unspecified: Secondary | ICD-10-CM

## 2013-07-16 DIAGNOSIS — K5289 Other specified noninfective gastroenteritis and colitis: Secondary | ICD-10-CM | POA: Diagnosis not present

## 2013-07-16 DIAGNOSIS — Z86711 Personal history of pulmonary embolism: Secondary | ICD-10-CM | POA: Diagnosis not present

## 2013-07-16 DIAGNOSIS — F3289 Other specified depressive episodes: Secondary | ICD-10-CM | POA: Insufficient documentation

## 2013-07-16 DIAGNOSIS — H9190 Unspecified hearing loss, unspecified ear: Secondary | ICD-10-CM | POA: Insufficient documentation

## 2013-07-16 DIAGNOSIS — Z79899 Other long term (current) drug therapy: Secondary | ICD-10-CM | POA: Insufficient documentation

## 2013-07-16 DIAGNOSIS — F329 Major depressive disorder, single episode, unspecified: Secondary | ICD-10-CM | POA: Insufficient documentation

## 2013-07-16 LAB — URINALYSIS, ROUTINE W REFLEX MICROSCOPIC
BILIRUBIN URINE: NEGATIVE
Glucose, UA: NEGATIVE mg/dL
Ketones, ur: NEGATIVE mg/dL
LEUKOCYTES UA: NEGATIVE
Nitrite: NEGATIVE
PH: 5 (ref 5.0–8.0)
Protein, ur: NEGATIVE mg/dL
SPECIFIC GRAVITY, URINE: 1.023 (ref 1.005–1.030)
UROBILINOGEN UA: 0.2 mg/dL (ref 0.0–1.0)

## 2013-07-16 LAB — COMPREHENSIVE METABOLIC PANEL
ALT: 21 U/L (ref 0–35)
AST: 25 U/L (ref 0–37)
Albumin: 4.1 g/dL (ref 3.5–5.2)
Alkaline Phosphatase: 81 U/L (ref 39–117)
BUN: 22 mg/dL (ref 6–23)
CALCIUM: 9.2 mg/dL (ref 8.4–10.5)
CO2: 22 meq/L (ref 19–32)
CREATININE: 0.9 mg/dL (ref 0.50–1.10)
Chloride: 104 mEq/L (ref 96–112)
GFR, EST AFRICAN AMERICAN: 84 mL/min — AB (ref >90)
GFR, EST NON AFRICAN AMERICAN: 72 mL/min — AB (ref >90)
GLUCOSE: 91 mg/dL (ref 70–99)
Potassium: 4 mEq/L (ref 3.7–5.3)
Sodium: 142 mEq/L (ref 137–147)
TOTAL PROTEIN: 7.4 g/dL (ref 6.0–8.3)
Total Bilirubin: <0.2 mg/dL — ABNORMAL LOW (ref 0.3–1.2)

## 2013-07-16 LAB — CBC WITH DIFFERENTIAL/PLATELET
Basophils Absolute: 0 10*3/uL (ref 0.0–0.1)
Basophils Relative: 1 % (ref 0–1)
EOS ABS: 0.1 10*3/uL (ref 0.0–0.7)
EOS PCT: 2 % (ref 0–5)
HEMATOCRIT: 40.3 % (ref 36.0–46.0)
Hemoglobin: 14.1 g/dL (ref 12.0–15.0)
LYMPHS ABS: 1.4 10*3/uL (ref 0.7–4.0)
LYMPHS PCT: 29 % (ref 12–46)
MCH: 33.8 pg (ref 26.0–34.0)
MCHC: 35 g/dL (ref 30.0–36.0)
MCV: 96.6 fL (ref 78.0–100.0)
MONO ABS: 0.3 10*3/uL (ref 0.1–1.0)
MONOS PCT: 6 % (ref 3–12)
Neutro Abs: 3 10*3/uL (ref 1.7–7.7)
Neutrophils Relative %: 63 % (ref 43–77)
Platelets: 259 10*3/uL (ref 150–400)
RBC: 4.17 MIL/uL (ref 3.87–5.11)
RDW: 14 % (ref 11.5–15.5)
WBC: 4.8 10*3/uL (ref 4.0–10.5)

## 2013-07-16 LAB — URINE MICROSCOPIC-ADD ON

## 2013-07-16 LAB — LIPASE, BLOOD: Lipase: 31 U/L (ref 11–59)

## 2013-07-16 MED ORDER — MORPHINE SULFATE 4 MG/ML IJ SOLN
4.0000 mg | Freq: Once | INTRAMUSCULAR | Status: AC
  Administered 2013-07-16: 4 mg via INTRAVENOUS
  Filled 2013-07-16: qty 1

## 2013-07-16 MED ORDER — SODIUM CHLORIDE 0.9 % IV BOLUS (SEPSIS)
1000.0000 mL | Freq: Once | INTRAVENOUS | Status: AC
  Administered 2013-07-16: 1000 mL via INTRAVENOUS

## 2013-07-16 MED ORDER — ONDANSETRON 8 MG PO TBDP: 8.0000 mg | ORAL_TABLET | Freq: Three times a day (TID) | ORAL | Status: DC | PRN

## 2013-07-16 MED ORDER — ONDANSETRON HCL 4 MG/2ML IJ SOLN
4.0000 mg | Freq: Once | INTRAMUSCULAR | Status: AC
  Administered 2013-07-16: 4 mg via INTRAVENOUS
  Filled 2013-07-16: qty 2

## 2013-07-16 MED ORDER — HYDROCODONE-ACETAMINOPHEN 5-325 MG PO TABS: 1.0000 | ORAL_TABLET | Freq: Four times a day (QID) | ORAL | Status: DC | PRN

## 2013-07-16 MED ORDER — IBUPROFEN 400 MG PO TABS: 400.0000 mg | ORAL_TABLET | Freq: Four times a day (QID) | ORAL | Status: DC | PRN

## 2013-07-16 MED ORDER — KETOROLAC TROMETHAMINE 30 MG/ML IJ SOLN
30.0000 mg | Freq: Once | INTRAMUSCULAR | Status: AC
  Administered 2013-07-16: 30 mg via INTRAVENOUS
  Filled 2013-07-16: qty 1

## 2013-07-16 MED ORDER — LOPERAMIDE HCL 2 MG PO CAPS: 2.0000 mg | ORAL_CAPSULE | Freq: Four times a day (QID) | ORAL | Status: DC | PRN

## 2013-07-16 NOTE — ED Notes (Signed)
Pt requesting food. Given crackers.

## 2013-07-16 NOTE — ED Notes (Signed)
Per pt sts diarrhea since yesterday and now N,V. sts that she feels dehydrated and has a HA.

## 2013-07-16 NOTE — Discharge Instructions (Signed)
We saw you in the ER for the vomiting, diarrhea, and pain. All the results in the ER are normal, labs and imaging. We think your symptoms are from a mild stomach flu, and fibromyalgia. The workup in the ER is not complete, and is limited to screening for life threatening and emergent conditions only, so please see a primary care doctor for further evaluation. Take the medication prescribed.   Viral Gastroenteritis Viral gastroenteritis is also known as stomach flu. This condition affects the stomach and intestinal tract. It can cause sudden diarrhea and vomiting. The illness typically lasts 3 to 8 days. Most people develop an immune response that eventually gets rid of the virus. While this natural response develops, the virus can make you quite ill. CAUSES  Many different viruses can cause gastroenteritis, such as rotavirus or noroviruses. You can catch one of these viruses by consuming contaminated food or water. You may also catch a virus by sharing utensils or other personal items with an infected person or by touching a contaminated surface. SYMPTOMS  The most common symptoms are diarrhea and vomiting. These problems can cause a severe loss of body fluids (dehydration) and a body salt (electrolyte) imbalance. Other symptoms may include:  Fever.  Headache.  Fatigue.  Abdominal pain. DIAGNOSIS  Your caregiver can usually diagnose viral gastroenteritis based on your symptoms and a physical exam. A stool sample may also be taken to test for the presence of viruses or other infections. TREATMENT  This illness typically goes away on its own. Treatments are aimed at rehydration. The most serious cases of viral gastroenteritis involve vomiting so severely that you are not able to keep fluids down. In these cases, fluids must be given through an intravenous line (IV). HOME CARE INSTRUCTIONS   Drink enough fluids to keep your urine clear or pale yellow. Drink small amounts of fluids frequently  and increase the amounts as tolerated.  Ask your caregiver for specific rehydration instructions.  Avoid:  Foods high in sugar.  Alcohol.  Carbonated drinks.  Tobacco.  Juice.  Caffeine drinks.  Extremely hot or cold fluids.  Fatty, greasy foods.  Too much intake of anything at one time.  Dairy products until 24 to 48 hours after diarrhea stops.  You may consume probiotics. Probiotics are active cultures of beneficial bacteria. They may lessen the amount and number of diarrheal stools in adults. Probiotics can be found in yogurt with active cultures and in supplements.  Wash your hands well to avoid spreading the virus.  Only take over-the-counter or prescription medicines for pain, discomfort, or fever as directed by your caregiver. Do not give aspirin to children. Antidiarrheal medicines are not recommended.  Ask your caregiver if you should continue to take your regular prescribed and over-the-counter medicines.  Keep all follow-up appointments as directed by your caregiver. SEEK IMMEDIATE MEDICAL CARE IF:   You are unable to keep fluids down.  You do not urinate at least once every 6 to 8 hours.  You develop shortness of breath.  You notice blood in your stool or vomit. This may look like coffee grounds.  You have abdominal pain that increases or is concentrated in one small area (localized).  You have persistent vomiting or diarrhea.  You have a fever.  The patient is a child younger than 3 months, and he or she has a fever.  The patient is a child older than 3 months, and he or she has a fever and persistent symptoms.  The patient  is a child older than 3 months, and he or she has a fever and symptoms suddenly get worse.  The patient is a baby, and he or she has no tears when crying. MAKE SURE YOU:   Understand these instructions.  Will watch your condition.  Will get help right away if you are not doing well or get worse. Document Released:  04/26/2005 Document Revised: 07/19/2011 Document Reviewed: 02/10/2011 [facility] Patient Information 2014 [place]. Fibromyalgia Fibromyalgia is a disorder that is often misunderstood. It is associated with muscular pains and tenderness that comes and goes. It is often associated with fatigue and sleep disturbances. Though it tends to be long-lasting, fibromyalgia is not life-threatening. CAUSES  The exact cause of fibromyalgia is unknown. People with certain gene types are predisposed to developing fibromyalgia and other conditions. Certain factors can play a role as triggers, such as:  Spine disorders.  Arthritis.  Severe injury (trauma) and other physical stressors.  Emotional stressors. SYMPTOMS   The main symptom is pain and stiffness in the muscles and joints, which can vary over time.  Sleep and fatigue problems. Other related symptoms may include:  Bowel and bladder problems.  Headaches.  Visual problems.  Problems with odors and noises.  Depression or mood changes.  Painful periods (dysmenorrhea).  Dryness of the skin or eyes. DIAGNOSIS  There are no specific tests for diagnosing fibromyalgia. Patients can be diagnosed accurately from the specific symptoms they have. The diagnosis is made by determining that nothing else is causing the problems. TREATMENT  There is no cure. Management includes medicines and an active, healthy lifestyle. The goal is to enhance physical fitness, decrease pain, and improve sleep. HOME CARE INSTRUCTIONS   Only take over-the-counter or prescription medicines as directed by your caregiver. Sleeping pills, tranquilizers, and pain medicines may make your problems worse.  Low-impact aerobic exercise is very important and advised for treatment. At first, it may seem to make pain worse. Gradually increasing your tolerance will overcome this feeling.  Learning relaxation techniques and how to control stress will help you. Biofeedback,  visual imagery, hypnosis, muscle relaxation, yoga, and meditation are all options.  Anti-inflammatory medicines and physical therapy may provide short-term help.  Acupuncture or massage treatments may help.  Take muscle relaxant medicines as suggested by your caregiver.  Avoid stressful situations.  Plan a healthy lifestyle. This includes your diet, sleep, rest, exercise, and friends.  Find and practice a hobby you enjoy.  Join a fibromyalgia support group for interaction, ideas, and sharing advice. This may be helpful. SEEK MEDICAL CARE IF:  You are not having good results or improvement from your treatment. FOR MORE INFORMATION  National Fibromyalgia Association: www.fmaware.[place]: www.arthritis.org Document Released: 04/26/2005 Document Revised: 07/19/2011 Document Reviewed: 08/06/2009 [facility] Patient Information 2014 [place], [place].

## 2013-07-16 NOTE — ED Provider Notes (Addendum)
CSN: [id number]     Arrival date & time 07/16/13  1228 History   First MD Initiated Contact with Patient 07/16/13 1502     Chief Complaint  Patient presents with  . Diarrhea     (Consider location/radiation/quality/duration/timing/severity/associated sxs/prior Treatment) HPI Comments: 53 y F with PMH of depression, migraine, FM, prior alcoholism with pancreatitis and HTN who comes in to the ED with cc of diarrhea and diffuse pain. Pt states that her sx started last night with diarrhea. She has had about 3 BM today, loose, non bloody. Pt also has nausea and emesis. Abd pain is in the epigastrium, non radiating. Pt has diffuse body aches and headaches and was unable to keep any meds down. No chest pains. Pt has no travel hx, no recent admission or antibiotics use.   Patient is a 53 y.o. female presenting with diarrhea. The history is provided by the patient and medical records.  Diarrhea Associated symptoms: abdominal pain, headaches and vomiting   Associated symptoms: no fever     Past Medical History  Diagnosis Date  . Depression   . Migraine   . Fibromyalgia   . Hearing impairment   . Arthritis   . Cardiomyopathy   . Pulmonary embolism   . Gastritis   . Hypertension   . Osteoporosis    Past Surgical History  Procedure Laterality Date  . Appendectomy    . Inner ear surgery      TUBES  . Tonsillectomy and adenoidectomy    . Tubal ligation    . Laparoscopic cholecystectomy  2012  . Cardiac surgery  January 2007    mitral valve repair  . Cardiac surgery  1973    atrial septal defect   Family History  Problem Relation Age of Onset  . Cancer Mother   . Breast cancer Mother   . Depression Sister   . Heart disease Father   . Hypertension Father    History  Substance Use Topics  . Smoking status: Never Smoker   . Smokeless tobacco: Not on file  . Alcohol Use: No   OB History   Grav Para Term Preterm Abortions TAB SAB Ect Mult Living   2 2 2       2      Review of  Systems  Constitutional: Negative for fever and activity change.  Respiratory: Negative for shortness of breath.   Cardiovascular: Negative for chest pain and palpitations.  Gastrointestinal: Positive for nausea, vomiting, abdominal pain and diarrhea.  Genitourinary: Negative for dysuria.  Musculoskeletal: Negative for neck pain.  Neurological: Positive for light-headedness and headaches.  All other systems reviewed and are negative.      Allergies  Depakote and Valium  Home Medications   Current Outpatient Rx  Name  Route  Sig  Dispense  Refill  . ARIPiprazole (ABILIFY) 10 MG tablet   Oral   Take 1 tablet (10 mg total) by mouth daily.   90 tablet   0     Replaces previous RX   . aspirin EC 81 MG tablet   Oral   Take 81 mg by mouth every morning.         [name] aspirin-acetaminophen-caffeine (EXCEDRIN MIGRAINE) 250-250-65 MG per tablet   Oral   Take 2 tablets by mouth daily as needed for headache.         . BuPROPion HCl ER, XL, (FORFIVO XL) 450 MG TB24   Oral   Take 450 mg by mouth every morning.         [name]  butalbital-acetaminophen-caffeine (FIORICET, ESGIC) 50-325-40 MG per tablet   Oral   Take 1 tablet by mouth 2 (two) times daily as needed for headache.         . Calcium Carbonate-Vitamin D (CALCIUM 600 + D PO)   Oral   Take 1 tablet by mouth 2 (two) times daily.         . carvedilol (COREG) 25 MG tablet   Oral   Take 25 mg by mouth 2 (two) times daily with a meal.         . desvenlafaxine (PRISTIQ) 100 MG 24 hr tablet   Oral   Take 1 tablet (100 mg total) by mouth daily.   90 tablet   0     Replaces previous RX   . Flaxseed, Linseed, (FLAXSEED OIL) 1000 MG CAPS   Oral   Take 1,000 mg by mouth 2 (two) times daily.         . hydrochlorothiazide (MICROZIDE) 12.5 MG capsule   Oral   Take 12.5 mg by mouth every morning.          . mirabegron ER (MYRBETRIQ) 25 MG TB24 tablet   Oral   Take 1 tablet (25 mg total) by mouth daily.   30  tablet   11   . Multiple Vitamin (MULTIVITAMIN) tablet   Oral   Take 1 tablet by mouth daily.           BP 139/85  Pulse 91  Temp(Src) 98.3 F (36.8 C)  Resp 18  SpO2 98% Physical Exam  Nursing note and vitals reviewed. Constitutional: She is oriented to person, place, and time. She appears well-developed and well-nourished.  HENT:  Head: Normocephalic and atraumatic.  Eyes: EOM are normal. Pupils are equal, round, and reactive to light.  Neck: Neck supple.  Cardiovascular: Normal rate, regular rhythm and normal heart sounds.   No murmur heard. Pulmonary/Chest: Effort normal. No respiratory distress.  Abdominal: Soft. She exhibits no distension. There is tenderness. There is no rebound and no guarding.  Mild epigastric tenderness  Neurological: She is alert and oriented to person, place, and time.  Skin: Skin is warm and dry.    ED Course  Procedures (including critical care time) Labs Review Labs Reviewed  COMPREHENSIVE METABOLIC PANEL - Abnormal; Notable for the following:    Total Bilirubin <0.2 (*)    GFR calc non Af Amer 72 (*)    GFR calc Af Amer 84 (*)    All other components within normal limits  CBC WITH DIFFERENTIAL  URINALYSIS, ROUTINE W REFLEX MICROSCOPIC   Imaging Review No results found.   EKG Interpretation None      MDM   Final diagnoses:  None    Pt comes in with cc of epigastric abd pain, with nausea, emesis and diarrhea. Pt has hx of Cholecystostomy and appendectomy.  DDx includes: Pancreatitis Hepatobiliary pathology Gastritis/PUD SBO ACS syndrome Aortic Dissection Gastroenteritis  Pt's vitals are reassuring - orthostatics added. Pt's abd exam is non peritoneal. She has hx of alcohol abuse, currently no alcohol use, no substance abuse - but we will get the lipase level. CNC and CMP have already returned - and are are normal. AAS ordered to r/o obstruction - once that is accomplished, will start po challenge.   Varney Biles, MD 07/16/13 1533  6:09 PM PO challenge passed. No emesis, no diarrhea in the ED. Pt's AAS and labs are reassuring, Stable for discharge.  Varney Biles, MD 07/16/13 1810

## 2013-07-16 NOTE — ED Notes (Addendum)
Per Dr. Kathrynn Humble - pt can have PO fluids. Pt given ginger ale.

## 2013-07-16 NOTE — ED Notes (Signed)
Pt c/o diarrhea (loose, watery, denies blood), starting 8am yesterday continuing through today.  She arrived at the ED 12:30pm and has not had an episode of diarrhea but vomited while in waiting room.  Pt states nausea/vomiting started this morning (3x today).  Pt states feeling dry, attempted to drink water but immediately vomited.  Pt c/o head, eye and neck pain, and abdominal pain (8/10).  Pt denies travel, new foods or meds.  Pt states she did not take her morning medications because of n/v.  Hx fibromyalgia, migraines, osteoarthritis, paralyzed esophagus (congenital).  Pt states she does not feel dizzy or light headed.  No acute distress noted, skin warm and dry, respirations equal and unlabored.

## 2013-07-16 NOTE — ED Notes (Signed)
Called main lab, they will add on lipase.

## 2013-07-20 ENCOUNTER — Other Ambulatory Visit: Payer: Self-pay | Admitting: *Deleted

## 2013-07-20 DIAGNOSIS — R7989 Other specified abnormal findings of blood chemistry: Secondary | ICD-10-CM

## 2013-07-20 DIAGNOSIS — E7800 Pure hypercholesterolemia, unspecified: Secondary | ICD-10-CM

## 2013-07-23 ENCOUNTER — Other Ambulatory Visit: Payer: Self-pay | Admitting: Cardiology

## 2013-07-23 ENCOUNTER — Other Ambulatory Visit: Payer: Medicare Other

## 2013-07-23 DIAGNOSIS — R946 Abnormal results of thyroid function studies: Secondary | ICD-10-CM | POA: Diagnosis not present

## 2013-07-23 DIAGNOSIS — R7989 Other specified abnormal findings of blood chemistry: Secondary | ICD-10-CM

## 2013-07-23 DIAGNOSIS — E7800 Pure hypercholesterolemia, unspecified: Secondary | ICD-10-CM

## 2013-07-23 MED ORDER — CARVEDILOL 25 MG PO TABS: 25.0000 mg | ORAL_TABLET | Freq: Two times a day (BID) | ORAL | Status: DC

## 2013-07-24 DIAGNOSIS — R946 Abnormal results of thyroid function studies: Secondary | ICD-10-CM | POA: Diagnosis not present

## 2013-07-24 DIAGNOSIS — E7800 Pure hypercholesterolemia, unspecified: Secondary | ICD-10-CM | POA: Diagnosis not present

## 2013-07-24 LAB — LIPID PANEL
CHOL/HDL RATIO: 2.8 ratio
CHOLESTEROL: 213 mg/dL — AB (ref 0–200)
HDL: 76 mg/dL (ref >39)
LDL Cholesterol: 112 mg/dL — ABNORMAL HIGH (ref 0–99)
TRIGLYCERIDES: 125 mg/dL (ref <150)
VLDL: 25 mg/dL (ref 0–40)

## 2013-07-24 NOTE — Telephone Encounter (Signed)
Spoke with patient appointment scheduled on 09/27/13 @ 9:00 per patient.

## 2013-07-25 LAB — THYROID PANEL WITH TSH
FREE THYROXINE INDEX: 2.7 (ref 1.0–3.9)
T3 UPTAKE: 33.9 % (ref 22.5–37.0)
T4, Total: 8 ug/dL (ref 5.0–12.5)
TSH: 2.658 u[IU]/mL (ref 0.350–4.500)

## 2013-07-30 ENCOUNTER — Telehealth: Payer: Self-pay | Admitting: *Deleted

## 2013-07-30 NOTE — Telephone Encounter (Signed)
Pt called to have recheck of Cholesterol and thyroid panel results from 07/23/13. Pt informed.

## 2013-08-22 ENCOUNTER — Ambulatory Visit (INDEPENDENT_AMBULATORY_CARE_PROVIDER_SITE_OTHER): Payer: Medicare Other | Admitting: Psychiatry

## 2013-08-22 VITALS — BP 123/88 | HR 78 | Ht 65.5 in | Wt 148.2 lb

## 2013-08-22 DIAGNOSIS — F332 Major depressive disorder, recurrent severe without psychotic features: Secondary | ICD-10-CM | POA: Diagnosis not present

## 2013-08-22 DIAGNOSIS — F321 Major depressive disorder, single episode, moderate: Secondary | ICD-10-CM

## 2013-08-22 DIAGNOSIS — F339 Major depressive disorder, recurrent, unspecified: Secondary | ICD-10-CM

## 2013-08-22 MED ORDER — DESVENLAFAXINE SUCCINATE ER 100 MG PO TB24: 100.0000 mg | ORAL_TABLET | Freq: Every day | ORAL | Status: DC

## 2013-08-22 MED ORDER — BUPROPION HCL ER (XL) 450 MG PO TB24: 450.0000 mg | ORAL_TABLET | Freq: Every morning | ORAL | Status: DC

## 2013-08-22 MED ORDER — ARIPIPRAZOLE 10 MG PO TABS: 5.0000 mg | ORAL_TABLET | Freq: Every day | ORAL | Status: DC
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
## 2013-08-23 DIAGNOSIS — L02619 Cutaneous abscess of unspecified foot: Secondary | ICD-10-CM | POA: Diagnosis not present

## 2013-08-23 DIAGNOSIS — L03119 Cellulitis of unspecified part of limb: Secondary | ICD-10-CM | POA: Diagnosis not present

## 2013-09-04 ENCOUNTER — Ambulatory Visit: Payer: Self-pay | Admitting: Cardiology

## 2013-09-04 ENCOUNTER — Other Ambulatory Visit: Payer: Self-pay

## 2013-09-04 ENCOUNTER — Other Ambulatory Visit: Payer: Self-pay | Admitting: Gynecology

## 2013-09-10 DIAGNOSIS — J329 Chronic sinusitis, unspecified: Secondary | ICD-10-CM | POA: Diagnosis not present

## 2013-09-10 DIAGNOSIS — J40 Bronchitis, not specified as acute or chronic: Secondary | ICD-10-CM | POA: Diagnosis not present

## 2013-09-14 DIAGNOSIS — J40 Bronchitis, not specified as acute or chronic: Secondary | ICD-10-CM | POA: Diagnosis not present

## 2013-09-14 DIAGNOSIS — R51 Headache: Secondary | ICD-10-CM | POA: Diagnosis not present

## 2013-09-15 ENCOUNTER — Encounter (HOSPITAL_BASED_OUTPATIENT_CLINIC_OR_DEPARTMENT_OTHER): Payer: Self-pay | Admitting: Emergency Medicine

## 2013-09-15 ENCOUNTER — Emergency Department (HOSPITAL_BASED_OUTPATIENT_CLINIC_OR_DEPARTMENT_OTHER)
Admission: EM | Admit: 2013-09-15 | Discharge: 2013-09-15 | Disposition: A | Discharge status: Home / Self Care | Payer: Medicare Other | Attending: Emergency Medicine | Admitting: Emergency Medicine

## 2013-09-15 DIAGNOSIS — M129 Arthropathy, unspecified: Secondary | ICD-10-CM | POA: Diagnosis not present

## 2013-09-15 DIAGNOSIS — Z79899 Other long term (current) drug therapy: Secondary | ICD-10-CM | POA: Insufficient documentation

## 2013-09-15 DIAGNOSIS — Z86711 Personal history of pulmonary embolism: Secondary | ICD-10-CM | POA: Insufficient documentation

## 2013-09-15 DIAGNOSIS — I10 Essential (primary) hypertension: Secondary | ICD-10-CM | POA: Insufficient documentation

## 2013-09-15 DIAGNOSIS — F329 Major depressive disorder, single episode, unspecified: Secondary | ICD-10-CM | POA: Insufficient documentation

## 2013-09-15 DIAGNOSIS — K2970 Gastritis, unspecified, without bleeding: Secondary | ICD-10-CM | POA: Insufficient documentation

## 2013-09-15 DIAGNOSIS — IMO0001 Reserved for inherently not codable concepts without codable children: Secondary | ICD-10-CM | POA: Diagnosis not present

## 2013-09-15 DIAGNOSIS — Z8679 Personal history of other diseases of the circulatory system: Secondary | ICD-10-CM | POA: Insufficient documentation

## 2013-09-15 DIAGNOSIS — M810 Age-related osteoporosis without current pathological fracture: Secondary | ICD-10-CM | POA: Insufficient documentation

## 2013-09-15 DIAGNOSIS — Z8669 Personal history of other diseases of the nervous system and sense organs: Secondary | ICD-10-CM | POA: Diagnosis not present

## 2013-09-15 DIAGNOSIS — K2990 Gastroduodenitis, unspecified, without bleeding: Secondary | ICD-10-CM | POA: Diagnosis not present

## 2013-09-15 DIAGNOSIS — Z9889 Other specified postprocedural states: Secondary | ICD-10-CM | POA: Insufficient documentation

## 2013-09-15 DIAGNOSIS — G43909 Migraine, unspecified, not intractable, without status migrainosus: Secondary | ICD-10-CM | POA: Insufficient documentation

## 2013-09-15 DIAGNOSIS — F3289 Other specified depressive episodes: Secondary | ICD-10-CM | POA: Insufficient documentation

## 2013-09-15 DIAGNOSIS — Z7982 Long term (current) use of aspirin: Secondary | ICD-10-CM | POA: Insufficient documentation

## 2013-09-15 MED ORDER — HYDROMORPHONE HCL PF 1 MG/ML IJ SOLN
0.5000 mg | Freq: Once | INTRAMUSCULAR | Status: AC
  Administered 2013-09-15: 0.5 mg via INTRAVENOUS
  Filled 2013-09-15: qty 1

## 2013-09-15 MED ORDER — DIPHENHYDRAMINE HCL 50 MG/ML IJ SOLN
12.5000 mg | Freq: Once | INTRAMUSCULAR | Status: AC
  Administered 2013-09-15: 12.5 mg via INTRAVENOUS
  Filled 2013-09-15: qty 1

## 2013-09-15 MED ORDER — METOCLOPRAMIDE HCL 5 MG/ML IJ SOLN
10.0000 mg | Freq: Once | INTRAMUSCULAR | Status: AC
  Administered 2013-09-15: 10 mg via INTRAVENOUS
  Filled 2013-09-15: qty 2

## 2013-09-15 MED ORDER — HYDROMORPHONE HCL PF 1 MG/ML IJ SOLN
1.0000 mg | Freq: Once | INTRAMUSCULAR | Status: AC
  Administered 2013-09-15: 1 mg via INTRAVENOUS
  Filled 2013-09-15: qty 1

## 2013-09-15 MED ORDER — DEXAMETHASONE SODIUM PHOSPHATE 10 MG/ML IJ SOLN
10.0000 mg | Freq: Once | INTRAMUSCULAR | Status: AC
  Administered 2013-09-15: 10 mg via INTRAVENOUS
  Filled 2013-09-15: qty 1

## 2013-09-15 NOTE — Discharge Instructions (Signed)
Migraine Headache A migraine headache is an intense, throbbing pain on one or both sides of your head. A migraine can last for 30 minutes to several hours. CAUSES  The exact cause of a migraine headache is not always known. However, a migraine may be caused when nerves in the brain become irritated and release chemicals that cause inflammation. This causes pain. Certain things may also trigger migraines, such as:  Alcohol.  Smoking.  Stress.  Menstruation.  Aged cheeses.  Foods or drinks that contain nitrates, glutamate, aspartame, or tyramine.  Lack of sleep.  Chocolate.  Caffeine.  Hunger.  Physical exertion.  Fatigue.  Medicines used to treat chest pain (nitroglycerine), birth control pills, estrogen, and some blood pressure medicines. SIGNS AND SYMPTOMS  Pain on one or both sides of your head.  Pulsating or throbbing pain.  Severe pain that prevents daily activities.  Pain that is aggravated by any physical activity.  Nausea, vomiting, or both.  Dizziness.  Pain with exposure to bright lights, loud noises, or activity.  General sensitivity to bright lights, loud noises, or smells. Before you get a migraine, you may get warning signs that a migraine is coming (aura). An aura may include:  Seeing flashing lights.  Seeing bright spots, halos, or zig-zag lines.  Having tunnel vision or blurred vision.  Having feelings of numbness or tingling.  Having trouble talking.  Having muscle weakness. DIAGNOSIS  A migraine headache is often diagnosed based on:  Symptoms.  Physical exam.  A CT scan or MRI of your head. These imaging tests cannot diagnose migraines, but they can help rule out other causes of headaches. TREATMENT Medicines may be given for pain and nausea. Medicines can also be given to help prevent recurrent migraines.  HOME CARE INSTRUCTIONS  Only take over-the-counter or prescription medicines for pain or discomfort as directed by your  health care provider. The use of long-term narcotics is not recommended.  Lie down in a dark, quiet room when you have a migraine.  Keep a journal to find out what may trigger your migraine headaches. For example, write down:  What you eat and drink.  How much sleep you get.  Any change to your diet or medicines.  Limit alcohol consumption.  Quit smoking if you smoke.  Get 7 9 hours of sleep, or as recommended by your health care provider.  Limit stress.  Keep lights dim if bright lights bother you and make your migraines worse. SEEK IMMEDIATE MEDICAL CARE IF:   Your migraine becomes severe.  You have a fever.  You have a stiff neck.  You have vision loss.  You have muscular weakness or loss of muscle control.  You start losing your balance or have trouble walking.  You feel faint or pass out.  You have severe symptoms that are different from your first symptoms. MAKE SURE YOU:   Understand these instructions.  Will watch your condition.  Will get help right away if you are not doing well or get worse. Document Released: 04/26/2005 Document Revised: 02/14/2013 Document Reviewed: 01/01/2013 [employer] Patient Information 2014 [employer].  

## 2013-09-15 NOTE — ED Notes (Signed)
Patient here with migraine that she reports started yesterday and saw her doctor yesterday and received toradol shot with minimal relief. Reports hx of same, nausea without vomiting. Also currently taking amoxicillin for bronchitis

## 2013-09-15 NOTE — ED Provider Notes (Signed)
CSN: [id number]     Arrival date & time 09/15/13  1201 History   First MD Initiated Contact with Patient 09/15/13 1213     Chief Complaint  Patient presents with  . Migraine     (Consider location/radiation/quality/duration/timing/severity/associated sxs/prior Treatment) Patient is a 53 y.o. female presenting with migraines. The history is provided by the patient and a significant other. No language interpreter was used.  Migraine This is a new problem. The current episode started yesterday. Associated symptoms include headaches and nausea. Pertinent negatives include no chills, congestion, coughing, fever, myalgias or vomiting. Associated symptoms comments: Headache the patient reports is typical of her migraine pattern that started yesterday. Symptoms not improved with regular medication taken yesterday. She reports having been seen by her doctor yesterday and received Toradol IM without improvement. Headache is located bilateral frontal area. No congestion, fever, weakness. She has had nausea without vomiting. .    Past Medical History  Diagnosis Date  . Depression   . Migraine   . Fibromyalgia   . Hearing impairment   . Arthritis   . Cardiomyopathy   . Pulmonary embolism   . Gastritis   . Hypertension   . Osteoporosis    Past Surgical History  Procedure Laterality Date  . Appendectomy    . Inner ear surgery      TUBES  . Tonsillectomy and adenoidectomy    . Tubal ligation    . Laparoscopic cholecystectomy  2012  . Cardiac surgery  January 2007    mitral valve repair  . Cardiac surgery  1973    atrial septal defect   Family History  Problem Relation Age of Onset  . Cancer Mother   . Breast cancer Mother   . Depression Sister   . Heart disease Father   . Hypertension Father    History  Substance Use Topics  . Smoking status: Never Smoker   . Smokeless tobacco: Not on file  . Alcohol Use: No   OB History   Grav Para Term Preterm Abortions TAB SAB Ect Mult  Living   2 2 2       2      Review of Systems  Constitutional: Negative for fever and chills.  HENT: Negative for congestion and sinus pressure.   Respiratory: Negative.  Negative for cough.   Cardiovascular: Negative.   Gastrointestinal: Positive for nausea. Negative for vomiting.  Musculoskeletal: Negative.  Negative for myalgias.  Skin: Negative.   Neurological: Positive for headaches.  Psychiatric/Behavioral: Negative for confusion.      Allergies  Depakote and Valium  Home Medications   Prior to Admission medications   Medication Sig Start Date End Date Taking? Authorizing Provider  ARIPiprazole (ABILIFY) 10 MG tablet Take 0.5 tablets (5 mg total) by mouth daily. 08/22/13   Norma Fredrickson, MD  aspirin EC 81 MG tablet Take 81 mg by mouth every morning.    Historical Provider, MD  aspirin-acetaminophen-caffeine (EXCEDRIN MIGRAINE) [phone] MG per tablet Take 2 tablets by mouth daily as needed for headache.    Historical Provider, MD  BuPROPion HCl ER, XL, (FORFIVO XL) 450 MG TB24 Take 450 mg by mouth every morning. 08/22/13   Norma Fredrickson, MD  butalbital-acetaminophen-caffeine (FIORICET, ESGIC) [phone] MG per tablet Take 1 tablet by mouth 2 (two) times daily as needed for headache.    Historical Provider, MD  Calcium Carbonate-Vitamin D (CALCIUM 600 + D PO) Take 1 tablet by mouth 2 (two) times daily.    Historical Provider, MD  carvedilol (COREG) 25 MG tablet Take 1 tablet (25 mg total) by mouth 2 (two) times daily with a meal. 07/23/13   Candee Furbish, MD  desvenlafaxine (PRISTIQ) 100 MG 24 hr tablet Take 1 tablet (100 mg total) by mouth daily. 08/22/13   Norma Fredrickson, MD  Flaxseed, Linseed, (FLAXSEED OIL) 1000 MG CAPS Take 1,000 mg by mouth 2 (two) times daily.    Historical Provider, MD  hydrochlorothiazide (MICROZIDE) 12.5 MG capsule Take 12.5 mg by mouth every morning.     Historical Provider, MD  ibuprofen (ADVIL,MOTRIN) 400 MG tablet Take 1 tablet (400 mg total) by mouth  every 6 (six) hours as needed. 07/16/13   Varney Biles, MD  loperamide (IMODIUM) 2 MG capsule Take 1 capsule (2 mg total) by mouth 4 (four) times daily as needed for diarrhea or loose stools. 07/16/13   Varney Biles, MD  mirabegron ER (MYRBETRIQ) 25 MG TB24 tablet Take 1 tablet (25 mg total) by mouth daily. 06/13/13   Terrance Mass, MD  Multiple Vitamin (MULTIVITAMIN) tablet Take 1 tablet by mouth daily.     Historical Provider, MD  ondansetron (ZOFRAN ODT) 8 MG disintegrating tablet Take 1 tablet (8 mg total) by mouth every 8 (eight) hours as needed for nausea. 07/16/13   Ankit Nanavati, MD   BP 137/97  Pulse 86  Temp(Src) 99 F (37.2 C)  Resp 18  SpO2 100% Physical Exam  Constitutional: She is oriented to person, place, and time. She appears well-developed and well-nourished. No distress.  HENT:  Head: Normocephalic.  Eyes: Pupils are equal, round, and reactive to light.  Neck: Normal range of motion. Neck supple.  Cardiovascular: Normal rate and regular rhythm.   Pulmonary/Chest: Effort normal and breath sounds normal.  Abdominal: Soft. Bowel sounds are normal. There is no tenderness. There is no rebound and no guarding.  Musculoskeletal: Normal range of motion.  Neurological: She is alert and oriented to person, place, and time. She has normal strength and normal reflexes. No sensory deficit. She displays a negative Romberg sign. Coordination normal.  Skin: Skin is warm and dry. No rash noted.  Psychiatric: She has a normal mood and affect.    ED Course  Procedures (including critical care time) Labs Review Labs Reviewed - No data to display  Imaging Review No results found.   EKG Interpretation None      MDM   Final diagnoses:  None    1. Migraine  Headache is responsive to medications here. She feels stable for discharge home.     Dewaine Oats, PA-C 09/15/13 1525

## 2013-09-16 NOTE — ED Provider Notes (Signed)
Medical screening examination/treatment/procedure(s) were performed by non-physician practitioner and as supervising physician I was immediately available for consultation/collaboration.   EKG Interpretation None        Malvin Johns, MD 09/16/13 [phone]

## 2013-09-17 ENCOUNTER — Emergency Department (HOSPITAL_COMMUNITY): Payer: Medicare Other

## 2013-09-17 ENCOUNTER — Encounter (HOSPITAL_COMMUNITY): Payer: Self-pay | Admitting: Emergency Medicine

## 2013-09-17 ENCOUNTER — Emergency Department (HOSPITAL_COMMUNITY)
Admission: EM | Admit: 2013-09-17 | Discharge: 2013-09-17 | Disposition: A | Discharge status: Home / Self Care | Payer: Medicare Other | Attending: Emergency Medicine | Admitting: Emergency Medicine

## 2013-09-17 DIAGNOSIS — Z8719 Personal history of other diseases of the digestive system: Secondary | ICD-10-CM | POA: Diagnosis not present

## 2013-09-17 DIAGNOSIS — Z8669 Personal history of other diseases of the nervous system and sense organs: Secondary | ICD-10-CM | POA: Insufficient documentation

## 2013-09-17 DIAGNOSIS — Z792 Long term (current) use of antibiotics: Secondary | ICD-10-CM | POA: Diagnosis not present

## 2013-09-17 DIAGNOSIS — F3289 Other specified depressive episodes: Secondary | ICD-10-CM | POA: Diagnosis not present

## 2013-09-17 DIAGNOSIS — R195 Other fecal abnormalities: Secondary | ICD-10-CM

## 2013-09-17 DIAGNOSIS — M810 Age-related osteoporosis without current pathological fracture: Secondary | ICD-10-CM | POA: Insufficient documentation

## 2013-09-17 DIAGNOSIS — Z7982 Long term (current) use of aspirin: Secondary | ICD-10-CM | POA: Insufficient documentation

## 2013-09-17 DIAGNOSIS — R05 Cough: Secondary | ICD-10-CM | POA: Diagnosis not present

## 2013-09-17 DIAGNOSIS — I10 Essential (primary) hypertension: Secondary | ICD-10-CM | POA: Diagnosis not present

## 2013-09-17 DIAGNOSIS — G43709 Chronic migraine without aura, not intractable, without status migrainosus: Secondary | ICD-10-CM | POA: Diagnosis not present

## 2013-09-17 DIAGNOSIS — M129 Arthropathy, unspecified: Secondary | ICD-10-CM | POA: Diagnosis not present

## 2013-09-17 DIAGNOSIS — Z86711 Personal history of pulmonary embolism: Secondary | ICD-10-CM | POA: Insufficient documentation

## 2013-09-17 DIAGNOSIS — D649 Anemia, unspecified: Secondary | ICD-10-CM | POA: Diagnosis not present

## 2013-09-17 DIAGNOSIS — F329 Major depressive disorder, single episode, unspecified: Secondary | ICD-10-CM | POA: Diagnosis not present

## 2013-09-17 DIAGNOSIS — K921 Melena: Secondary | ICD-10-CM | POA: Diagnosis not present

## 2013-09-17 DIAGNOSIS — IMO0001 Reserved for inherently not codable concepts without codable children: Secondary | ICD-10-CM | POA: Insufficient documentation

## 2013-09-17 DIAGNOSIS — J209 Acute bronchitis, unspecified: Secondary | ICD-10-CM | POA: Diagnosis not present

## 2013-09-17 DIAGNOSIS — R509 Fever, unspecified: Secondary | ICD-10-CM | POA: Diagnosis not present

## 2013-09-17 DIAGNOSIS — R059 Cough, unspecified: Secondary | ICD-10-CM | POA: Diagnosis not present

## 2013-09-17 LAB — BASIC METABOLIC PANEL
BUN: 22 mg/dL (ref 6–23)
CALCIUM: 8.5 mg/dL (ref 8.4–10.5)
CHLORIDE: 104 meq/L (ref 96–112)
CO2: 21 meq/L (ref 19–32)
Creatinine, Ser: 0.79 mg/dL (ref 0.50–1.10)
GFR calc Af Amer: >90 mL/min (ref >90)
GFR calc non Af Amer: >90 mL/min (ref >90)
Glucose, Bld: 99 mg/dL (ref 70–99)
Potassium: 3.9 mEq/L (ref 3.7–5.3)
SODIUM: 143 meq/L (ref 137–147)

## 2013-09-17 LAB — CBC WITH DIFFERENTIAL/PLATELET
Basophils Absolute: 0 10*3/uL (ref 0.0–0.1)
Basophils Relative: 0 % (ref 0–1)
Eosinophils Absolute: 0 10*3/uL (ref 0.0–0.7)
Eosinophils Relative: 0 % (ref 0–5)
HCT: 31.6 % — ABNORMAL LOW (ref 36.0–46.0)
Hemoglobin: 10.5 g/dL — ABNORMAL LOW (ref 12.0–15.0)
LYMPHS PCT: 9 % — AB (ref 12–46)
Lymphs Abs: 0.6 10*3/uL — ABNORMAL LOW (ref 0.7–4.0)
MCH: 32 pg (ref 26.0–34.0)
MCHC: 33.2 g/dL (ref 30.0–36.0)
MCV: 96.3 fL (ref 78.0–100.0)
Monocytes Absolute: 0.3 10*3/uL (ref 0.1–1.0)
Monocytes Relative: 5 % (ref 3–12)
NEUTROS ABS: 6.2 10*3/uL (ref 1.7–7.7)
Neutrophils Relative %: 86 % — ABNORMAL HIGH (ref 43–77)
PLATELETS: 148 10*3/uL — AB (ref 150–400)
RBC: 3.28 MIL/uL — ABNORMAL LOW (ref 3.87–5.11)
RDW: 13.7 % (ref 11.5–15.5)
WBC: 7.2 10*3/uL (ref 4.0–10.5)

## 2013-09-17 LAB — URINALYSIS, ROUTINE W REFLEX MICROSCOPIC
Bilirubin Urine: NEGATIVE
GLUCOSE, UA: NEGATIVE mg/dL
Hgb urine dipstick: NEGATIVE
Ketones, ur: NEGATIVE mg/dL
LEUKOCYTES UA: NEGATIVE
Nitrite: NEGATIVE
PH: 6.5 (ref 5.0–8.0)
PROTEIN: NEGATIVE mg/dL
Specific Gravity, Urine: 1.023 (ref 1.005–1.030)
Urobilinogen, UA: 1 mg/dL (ref 0.0–1.0)

## 2013-09-17 LAB — POC OCCULT BLOOD, ED: FECAL OCCULT BLD: POSITIVE — AB

## 2013-09-17 MED ORDER — HYDROCODONE-ACETAMINOPHEN 5-325 MG PO TABS: 2.0000 | ORAL_TABLET | ORAL | Status: DC | PRN

## 2013-09-17 MED ORDER — HYDROCODONE-ACETAMINOPHEN 5-325 MG PO TABS
2.0000 | ORAL_TABLET | Freq: Once | ORAL | Status: AC
  Administered 2013-09-17: 2 via ORAL
  Filled 2013-09-17: qty 2

## 2013-09-17 MED ORDER — AZITHROMYCIN 250 MG PO TABS: ORAL_TABLET | ORAL | Status: DC

## 2013-09-17 NOTE — Discharge Instructions (Signed)
Zithromax as prescribed.  Hydrocodone as needed for pain or cough.  Followup with your primary Dr. towards the end of the week for repeat blood work and reevaluation.  Return to the emergency department if you develop bloody stools, severe pain, or other new and concerning symptoms.  Fecal Occult Blood Test This is a test done on a stool specimen to screen for gastrointestinal bleeding, which may be an indicator of colon cancer Is is usually done as part of a routine examination, annually, after age 53 or as directed by your caregiver. The fecal occult blood test (FOBT) checks for blood in your stool. Normally, there will not be enough blood lost through the gastrointestinal tract to turn an FOBT positive or for you to notice it visually in the form of bloody or dark, tarry stools. Any significant amount of blood being passed should be investigated.  A positive FOBT will tell your caregiver that you have bleeding occurring somewhere in your gastrointestinal tract. This blood loss could be due to ulcers, diverticulosis, bleeding polyps, inflammatory bowel disease, hemorrhoids, from swallowed blood due to bleeding gums or nosebleeds, or it could be due to benign or cancerous tumors. Anything that protrudes into the lumen (the empty space in the intestine), like a polyp or tumor, and is rubbed against by the fecal waste as it passes through has the potential to eventually bleed intermittently. Often this small amount of blood is the first, and sometimes the only, symptom of early colon cancer, making the FOBT a valuable screening tool. PREPARATION FOR TEST  You should not eat red meat within three days before testing. Other substances that could cause a false positive test result include fish, turnips, horseradish, and drugs such as colchicines and oxidizing drugs (for example, iodine and boric acid). Be sure to carefully follow your caregiver's instructions. With FOBT, your caregiver or laboratory  will give you one or more test "cards." You collect a separate sample from three different stools, usually on consecutive days. Each stool sample should be collected into a clean container and should not be contaminated with urine or water. The slide is labeled with your name and the date; then, with an applicator stick, you apply a thin smear of stool onto each filter paper square/window contained on the card. Allow the filter paper to dry. Once it is dry, it is stable. Usually you will collect all of the consecutive samples, and then return all of them to your caregiver or laboratory at the same time, sometimes by mailing them. There are also over the counter tests which are dropped in your toilet. NORMAL FINDINGS   No occult blood within the stool.  The FOBT test is normally negative. A positive indicates either blood in the stool or an interfering substance. Multiple samples are done to: 1) catch intermittent bleeding; and 2) help rule out false positives. Ranges for normal findings may vary among different laboratories and hospitals. You should always check with your doctor after having lab work or other tests done to discuss the meaning of your test results and whether your values are considered within normal limits. MEANING OF TEST  Your caregiver will go over the test results with you and discuss the importance and meaning of your results, as well as treatment options and the need for additional tests if necessary. OBTAINING THE TEST RESULTS  It is your responsibility to obtain your test results. Ask the lab or department performing the test when and how you will get your  results. Document Released: 05/21/2004 Document Revised: 07/19/2011 Document Reviewed: 04/05/2008 [facility] Patient Information 2014 [place], [place].  Acute Bronchitis Bronchitis is inflammation of the airways that extend from the windpipe into the lungs (bronchi). The inflammation often causes mucus to develop. This leads to  a cough, which is the most common symptom of bronchitis.  In acute bronchitis, the condition usually develops suddenly and goes away over time, usually in a couple weeks. Smoking, allergies, and asthma can make bronchitis worse. Repeated episodes of bronchitis may cause further lung problems.  CAUSES Acute bronchitis is most often caused by the same virus that causes a cold. The virus can spread from person to person (contagious).  SIGNS AND SYMPTOMS   Cough.   Fever.   Coughing up mucus.   Body aches.   Chest congestion.   Chills.   Shortness of breath.   Sore throat.  DIAGNOSIS  Acute bronchitis is usually diagnosed through a physical exam. Tests, such as chest X-rays, are sometimes done to rule out other conditions.  TREATMENT  Acute bronchitis usually goes away in a couple weeks. Often times, no medical treatment is necessary. Medicines are sometimes given for relief of fever or cough. Antibiotics are usually not needed but may be prescribed in certain situations. In some cases, an inhaler may be recommended to help reduce shortness of breath and control the cough. A cool mist vaporizer may also be used to help thin bronchial secretions and make it easier to clear the chest.  HOME CARE INSTRUCTIONS  Get plenty of rest.   Drink enough fluids to keep your urine clear or pale yellow (unless you have a medical condition that requires fluid restriction). Increasing fluids may help thin your secretions and will prevent dehydration.   Only take over-the-counter or prescription medicines as directed by your health care provider.   Avoid smoking and secondhand smoke. Exposure to cigarette smoke or irritating chemicals will make bronchitis worse. If you are a smoker, consider using nicotine gum or skin patches to help control withdrawal symptoms. Quitting smoking will help your lungs heal faster.   Reduce the chances of another bout of acute bronchitis by washing your hands  frequently, avoiding people with cold symptoms, and trying not to touch your hands to your mouth, nose, or eyes.   Follow up with your health care provider as directed.  SEEK MEDICAL CARE IF: Your symptoms do not improve after 1 week of treatment.  SEEK IMMEDIATE MEDICAL CARE IF:  You develop an increased fever or chills.   You have chest pain.   You have severe shortness of breath.  You have bloody sputum.   You develop dehydration.  You develop fainting.  You develop repeated vomiting.  You develop a severe headache. MAKE SURE YOU:   Understand these instructions.  Will watch your condition.  Will get help right away if you are not doing well or get worse. Document Released: 06/03/2004 Document Revised: 12/27/2012 Document Reviewed: 10/17/2012 [facility] Patient Information 2014 [place].

## 2013-09-17 NOTE — ED Notes (Signed)
Patient transported to X-ray 

## 2013-09-17 NOTE — ED Notes (Signed)
Patient presents stating she has had a cough for about 3 weeks on Amoxicillin since last Monday.  Has generalized body aches with fever.

## 2013-09-17 NOTE — ED Notes (Signed)
Discharge inst reviewed, prescriptions given.

## 2013-09-17 NOTE — ED Notes (Signed)
Dr. Stark Jock in to see patient

## 2013-09-17 NOTE — ED Provider Notes (Signed)
CSN: [id number]     Arrival date & time 09/17/13  0424 History   First MD Initiated Contact with Patient 09/17/13 0545     Chief Complaint  Patient presents with  . Cough  . Generalized Body Aches     (Consider location/radiation/quality/duration/timing/severity/associated sxs/prior Treatment) HPI Comments: Patient is a 53 year old female with history of fibromyalgia, chronic migraines, depression. She presents today with complaints of fever and bodyaches which woke her from sleep. She states she's been sick for the past 3 weeks with a cough. She had been given amoxicillin however this did not seem to help. She denies any urinary complaints, nausea, vomiting, or diarrhea. She denies any stiff neck but does report intermittent headaches. She was seen last week at [facility] for a headache which required dilaudid.  Patient is a 53 y.o. female presenting with cough. The history is provided by the patient.  Cough Cough characteristics:  Productive Severity:  Moderate Onset quality:  Gradual Duration:  3 weeks Timing:  Constant Progression:  Worsening Relieved by:  Nothing Worsened by:  Nothing tried Ineffective treatments:  None tried Associated symptoms: fever and sinus congestion   Associated symptoms: no chest pain, no chills and no sore throat     Past Medical History  Diagnosis Date  . Depression   . Migraine   . Fibromyalgia   . Hearing impairment   . Arthritis   . Cardiomyopathy   . Pulmonary embolism   . Gastritis   . Hypertension   . Osteoporosis    Past Surgical History  Procedure Laterality Date  . Appendectomy    . Inner ear surgery      TUBES  . Tonsillectomy and adenoidectomy    . Tubal ligation    . Laparoscopic cholecystectomy  2012  . Cardiac surgery  January 2007    mitral valve repair  . Cardiac surgery  1973    atrial septal defect   Family History  Problem Relation Age of Onset  . Cancer Mother   . Breast cancer Mother   .  Depression Sister   . Heart disease Father   . Hypertension Father    History  Substance Use Topics  . Smoking status: Never Smoker   . Smokeless tobacco: Not on file  . Alcohol Use: No   OB History   Grav Para Term Preterm Abortions TAB SAB Ect Mult Living   2 2 2       2      Review of Systems  Constitutional: Positive for fever. Negative for chills.  HENT: Negative for sore throat.   Respiratory: Positive for cough.   Cardiovascular: Negative for chest pain.  All other systems reviewed and are negative.     Allergies  Depakote and Valium  Home Medications   Prior to Admission medications   Medication Sig Start Date End Date Taking? Authorizing Provider  amoxicillin (AMOXIL) 500 MG capsule Take 1,000 mg by mouth 2 (two) times daily. Take for 10 days starting 09/10/13   Yes Historical Provider, MD  ARIPiprazole (ABILIFY) 10 MG tablet Take 10 mg by mouth daily.   Yes Historical Provider, MD  aspirin EC 81 MG tablet Take 81 mg by mouth every morning.   Yes Historical Provider, MD  BuPROPion HCl ER, XL, (FORFIVO XL) 450 MG TB24 Take 450 mg by mouth every morning. 08/22/13  Yes Norma Fredrickson, MD  butalbital-acetaminophen-caffeine (FIORICET, ESGIC) [phone] MG per tablet Take 1 tablet by mouth 2 (two) times daily as  needed for headache.   Yes Historical Provider, MD  Calcium Carbonate-Vitamin D (CALCIUM 600 + D PO) Take 1 tablet by mouth 2 (two) times daily.   Yes Historical Provider, MD  carvedilol (COREG) 25 MG tablet Take 1 tablet (25 mg total) by mouth 2 (two) times daily with a meal. 07/23/13  Yes Candee Furbish, MD  desvenlafaxine (PRISTIQ) 100 MG 24 hr tablet Take 1 tablet (100 mg total) by mouth daily. 08/22/13  Yes Norma Fredrickson, MD  Flaxseed, Linseed, (FLAXSEED OIL) 1000 MG CAPS Take 1,000 mg by mouth 2 (two) times daily.   Yes Historical Provider, MD  hydrochlorothiazide (MICROZIDE) 12.5 MG capsule Take 12.5 mg by mouth every morning.    Yes Historical Provider, MD   loratadine (CLARITIN) 10 MG tablet Take 10 mg by mouth daily.   Yes Historical Provider, MD  mirabegron ER (MYRBETRIQ) 25 MG TB24 tablet Take 1 tablet (25 mg total) by mouth daily. 06/13/13  Yes Terrance Mass, MD  Multiple Vitamin (MULTIVITAMIN) tablet Take 1 tablet by mouth daily.    Yes Historical Provider, MD   BP 131/90  Pulse 109  Temp(Src) 99.7 F (37.6 C) (Oral)  Resp 16  Ht 5\' 5"  (1.651 m)  Wt 150 lb (68.04 kg)  BMI 24.96 kg/m2  SpO2 98% Physical Exam  Nursing note and vitals reviewed. Constitutional: She is oriented to person, place, and time. She appears well-developed and well-nourished. No distress.  HENT:  Head: Normocephalic and atraumatic.  Mouth/Throat: Oropharynx is clear and moist. No oropharyngeal exudate.  TMs are clear bilaterally.  Neck: Normal range of motion. Neck supple.  Cardiovascular: Normal rate, regular rhythm and normal heart sounds.   No murmur heard. Pulmonary/Chest: Effort normal and breath sounds normal. No respiratory distress. She has no wheezes.  Abdominal: Soft. Bowel sounds are normal. She exhibits no distension. There is no tenderness.  Musculoskeletal: Normal range of motion. She exhibits no edema.  Lymphadenopathy:    She has no cervical adenopathy.  Neurological: She is alert and oriented to person, place, and time.  Skin: Skin is warm and dry. She is not diaphoretic.    ED Course  Procedures (including critical care time) Labs Review Labs Reviewed  CBC WITH DIFFERENTIAL  BASIC METABOLIC PANEL  URINALYSIS, ROUTINE W REFLEX MICROSCOPIC    Imaging Review No results found.   EKG Interpretation None      MDM   Final diagnoses:  None    53 year old female with fibromyalgia and chronic migraines. She presents with severe myalgias and high fever that woke her from sleep. She is afebrile here and her physical examination is unremarkable. She reports cough for the past 2 weeks, however chest x-ray does not reveal a  pneumonia. As the symptoms have persisted for this length of time, I will prescribe Zithromax and treat with hydrocodone both for her pain and cough.  Her workup also reveals a hemoglobin of 10.5 which represents a several gram drop from several months ago. Rectal examination reveals very slightly heme positive stool. She is hemodynamically stable and is not symptomatic with ambulating. I am uncertain as to the significance of this, however this does not appear to be in acute drop. I do not feel that she requires admission for this, but rather outpatient followup. She will be treated with the above medications and advised to followup with her primary Dr. towards the end of the week to discuss repeat laboratory studies.    Veryl Speak, MD 09/17/13 [phone]

## 2013-09-18 ENCOUNTER — Telehealth: Payer: Self-pay | Admitting: *Deleted

## 2013-09-18 NOTE — Telephone Encounter (Signed)
Pt mother called regarding genetic counseling appointment. Pt has new appointment on 10/15/13 @ 12:30 pm at [facility] for counseling. I left message for pt mother to to call to inform her of this.

## 2013-09-18 NOTE — Telephone Encounter (Signed)
Pt mother informed with time and date to relay to patient.

## 2013-09-18 NOTE — Telephone Encounter (Signed)
Pt was called since never came for bloodwork for Reclast. She recently had the bloodwork drawn in ED. Pt ready to proceeed with infusion. Insurance Medicare/Medicaid therefore it is covered. Apt [place] [facility] floor, 5/19 at 9am. Pt informed and order to be sent [facility] CMA

## 2013-09-20 DIAGNOSIS — D649 Anemia, unspecified: Secondary | ICD-10-CM | POA: Diagnosis not present

## 2013-09-20 DIAGNOSIS — N760 Acute vaginitis: Secondary | ICD-10-CM | POA: Diagnosis not present

## 2013-09-20 DIAGNOSIS — J40 Bronchitis, not specified as acute or chronic: Secondary | ICD-10-CM | POA: Insufficient documentation

## 2013-09-20 HISTORY — DX: Bronchitis, not specified as acute or chronic: J40

## 2013-09-21 ENCOUNTER — Encounter: Payer: Self-pay | Admitting: Gastroenterology

## 2013-09-24 ENCOUNTER — Other Ambulatory Visit (HOSPITAL_COMMUNITY): Payer: Self-pay | Admitting: *Deleted

## 2013-09-25 ENCOUNTER — Ambulatory Visit (HOSPITAL_COMMUNITY)
Admission: RE | Admit: 2013-09-25 | Discharge: 2013-09-25 | Disposition: A | Discharge status: Home / Self Care | Payer: Medicare Other | Source: Ambulatory Visit | Attending: Gynecology | Admitting: Gynecology

## 2013-09-25 DIAGNOSIS — N959 Unspecified menopausal and perimenopausal disorder: Secondary | ICD-10-CM | POA: Diagnosis not present

## 2013-09-25 MED ORDER — ZOLEDRONIC ACID 5 MG/100ML IV SOLN
INTRAVENOUS | Status: AC
  Filled 2013-09-25: qty 100

## 2013-09-25 MED ORDER — ZOLEDRONIC ACID 5 MG/100ML IV SOLN
5.0000 mg | Freq: Once | INTRAVENOUS | Status: AC
  Administered 2013-09-25: 5 mg via INTRAVENOUS

## 2013-10-02 ENCOUNTER — Encounter: Payer: Self-pay | Admitting: Physician Assistant

## 2013-10-02 ENCOUNTER — Other Ambulatory Visit (INDEPENDENT_AMBULATORY_CARE_PROVIDER_SITE_OTHER): Payer: Medicare Other

## 2013-10-02 ENCOUNTER — Ambulatory Visit (INDEPENDENT_AMBULATORY_CARE_PROVIDER_SITE_OTHER): Payer: Medicare Other | Admitting: Physician Assistant

## 2013-10-02 VITALS — BP 118/72 | HR 76 | Ht 65.0 in | Wt 149.0 lb

## 2013-10-02 DIAGNOSIS — D649 Anemia, unspecified: Secondary | ICD-10-CM

## 2013-10-02 DIAGNOSIS — R195 Other fecal abnormalities: Secondary | ICD-10-CM

## 2013-10-02 LAB — VITAMIN B12: Vitamin B-12: 494 pg/mL (ref 211–911)

## 2013-10-02 LAB — FERRITIN: FERRITIN: 82.4 ng/mL (ref 10.0–291.0)

## 2013-10-02 LAB — FOLATE: Folate: >24.8 ng/mL (ref >5.9)

## 2013-10-02 MED ORDER — MOVIPREP 100 G PO SOLR: 1.0000 | ORAL | Status: DC

## 2013-10-02 NOTE — Progress Notes (Signed)
i agree with the plan above 

## 2013-10-02 NOTE — Progress Notes (Signed)
Subjective:    Patient ID: [name], female    DOB: Sep 09, 1960, 53 y.o.   MRN: [id number]  HPI   [name]  is a pleasant 53 year old white female referred today by her PCP at [facility] for further evaluation of a new normocytic anemia and Hemoccult-positive stool. She is known to Dr. Ardis Hughs from screening colonoscopy done in 2009 she also had Hemoccult-positive stool at that time and this was a normal exam Patient has history of fibromyalgia of frequent migraine headaches is status post repair of an atrial septal defect and a mitral valve prolapse and is status post appendectomy and cholecystectomy. She has history of anxiety, depression ,and hearing impairment. She does take a baby aspirin on a regular basis because of her coronary issues and admits to taking fairly regular ibuprofen for headaches and fibromyalgia. She currently has no specific GI symptoms, denies any problems with heartburn indigestion dysphagia or done aphasia abdominal pain nausea, vomiting or changes in her bowel habits. She does says she has  occasional loose stools, but has not noted any melena or hematochezia. Her mother mentions that she seems to have frequent "viral" episodes with nausea vomiting and diarrhea. She has not had a menstrual period over the past 5 years. Labs are reviewed and when she had an ER visit 09/17/2013 she was noted to have a hemoglobin of 10.5 hematocrit 31.6 MCV of 96 platelets 148 labs were repeated on 09/20/2013 as an outpatient hemoglobin of 12.2 hematocrit of 36.3 MCV of 95 and platelets 256.    Review of Systems  Constitutional: Positive for fatigue.  HENT: Negative.   Eyes: Negative.   Respiratory: Negative.   Cardiovascular: Negative.   Gastrointestinal: Negative.   Endocrine: Negative.   Genitourinary: Negative.   Musculoskeletal: Positive for myalgias.  Allergic/Immunologic: Negative.   Neurological: Negative.   Hematological: Negative.   Psychiatric/Behavioral:  Negative.    Outpatient Prescriptions Prior to Visit  Medication Sig Dispense Refill  . ARIPiprazole (ABILIFY) 10 MG tablet Take 10 mg by mouth daily.      [name] aspirin EC 81 MG tablet Take 81 mg by mouth every morning.      . BuPROPion HCl ER, XL, (FORFIVO XL) 450 MG TB24 Take 450 mg by mouth every morning.  90 tablet  1  . butalbital-acetaminophen-caffeine (FIORICET, ESGIC) 50-325-40 MG per tablet Take 1 tablet by mouth 2 (two) times daily as needed for headache.      . Calcium Carbonate-Vitamin D (CALCIUM 600 + D PO) Take 1 tablet by mouth 2 (two) times daily.      . carvedilol (COREG) 25 MG tablet Take 1 tablet (25 mg total) by mouth 2 (two) times daily with a meal.  60 tablet  3  . desvenlafaxine (PRISTIQ) 100 MG 24 hr tablet Take 1 tablet (100 mg total) by mouth daily.  90 tablet  1  . Flaxseed, Linseed, (FLAXSEED OIL) 1000 MG CAPS Take 1,000 mg by mouth 2 (two) times daily.      . hydrochlorothiazide (MICROZIDE) 12.5 MG capsule Take 12.5 mg by mouth every morning.       [name] HYDROcodone-acetaminophen (NORCO) 5-325 MG per tablet Take 2 tablets by mouth every 4 (four) hours as needed.  15 tablet  0  . loratadine (CLARITIN) 10 MG tablet Take 10 mg by mouth daily.      . mirabegron ER (MYRBETRIQ) 25 MG TB24 tablet Take 1 tablet (25 mg total) by mouth daily.  30 tablet  11  .  Multiple Vitamin (MULTIVITAMIN) tablet Take 1 tablet by mouth daily.       [name] amoxicillin (AMOXIL) 500 MG capsule Take 1,000 mg by mouth 2 (two) times daily. Take for 10 days starting 09/10/13      . azithromycin (ZITHROMAX Z-PAK) 250 MG tablet 2 po day one, then 1 daily x 4 days  6 tablet  0   No facility-administered medications prior to visit.   Allergies  Allergen Reactions  . Depakote [Divalproex Sodium] Anaphylaxis  . Valium Anaphylaxis   Patient Active Problem List   Diagnosis Date Noted  . Family history of breast cancer 07/13/2013  . Palpitation 07/02/2013  . History of mitral valve repair 07/02/2013  . Status  post patch closure of ASD 07/02/2013  . Hyperlipidemia 07/02/2013  . OAB (overactive bladder) 06/13/2013  . Ovarian cyst, left 03/15/2013  . Vaginal lesion 03/15/2013  . Ovarian mass 03/15/2013  . Maternal DVT (deep vein thrombosis), history of 02/26/2013  . PMB (postmenopausal bleeding) 02/26/2013  . Alcohol dependence 11/07/2012  . Unspecified vitamin D deficiency 08/17/2012  . L1 vertebral fracture 08/01/2012  . Spinal compression fracture 07/12/2012  . Menopausal state 07/12/2012  . Psoriasis 07/12/2012  . Major depressive disorder, recurrent episode, severe, without mention of psychotic behavior 12/07/2011  . Generalized anxiety disorder 12/07/2011  . Cardiomyopathy 03/16/2011  . Hearing impairment   . Migraine   . Arthritis    History  Substance Use Topics  . Smoking status: Never Smoker   . Smokeless tobacco: Not on file  . Alcohol Use: No   family history includes Breast cancer in her mother; Cancer in her mother; Depression in her sister; Heart disease in her father; Hypertension in her father.     Objective:   Physical Exam well-developed white female in no acute distress she is hearing impaired accompanied by her mother. Patient does well with lip reading. Blood pressure 118/72 pulse 76 height 5 foot 5 weight 149. HEENT; nontraumatic normocephalic EOMI PERRLA sclera anicteric, Supple; no JVD, Cardiovascular; regular rate and rhythm with S1-S2 no murmur or gallop, Pulmonary; clear bilaterally, Abdomen ;soft bowel sounds are present she is nontender there is no palpable mass or hepatosplenomegaly, Rectal; exam not done recently documented Hemoccult positive, Extremities; no clubbing ,cyanosis or edema skin warm and dry, Psych ;mood and affect appropriate        Assessment & Plan:  #29  53 year old white female with mild normocytic anemia and Hemoccult-positive stool currently asymptomatic from a GI standpoint. Patient does take a baby aspirin daily and fairly frequent  NSAIDs. Rule out aspirin/NSAID-induced gastropathy peptic ulcer disease or occult lesion #2 status post repair of ASD and mitral valve prolapse #3 hearing impairment #4 fibromyalgia #5 depression and anxiety  Plan; check anemia panel to rule out iron deficiency Schedule for colonoscopy and possible EGD with Dr. Ardis Hughs. Procedures were discussed in detail with the patient and she is agreeable to proceed. Further plans pending findings of about

## 2013-10-02 NOTE — Patient Instructions (Addendum)
Please go to the basement level to have your labs drawn.  You have been scheduled for an endoscopy and colonoscopy with propofol. Please follow the written instructions given to you at your visit today. Please pick up your prep at the pharmacy within the next 1-3 days. [facility] [address] If you use inhalers (even only as needed), please bring them with you on the day of your procedure. Your physician has requested that you go to www.startemmi.com and enter the access code given to you at your visit today. This web site gives a general overview about your procedure. However, you should still follow specific instructions given to you by our office regarding your preparation for the procedure.

## 2013-10-03 LAB — IRON AND TIBC
%SAT: 19 % — ABNORMAL LOW (ref 20–55)
IRON: 93 ug/dL (ref 42–145)
TIBC: 493 ug/dL — ABNORMAL HIGH (ref 250–470)
UIBC: 400 ug/dL (ref 125–400)

## 2013-10-09 ENCOUNTER — Telehealth: Payer: Self-pay | Admitting: Gastroenterology

## 2013-10-10 NOTE — Telephone Encounter (Signed)
[facility] calling to advice [place] that patient can not afford Movi Prep and requests an alternative. Scheduled for ECL 10-29-13

## 2013-10-10 NOTE — Telephone Encounter (Signed)
I left a message for this patient on her mother's answering maching.  I have put a sample of a Moviprep at our front desk for her to pick up. I left the date of the message, today 10-10-2013. I advised her mother to let [name] the patient know.

## 2013-10-15 ENCOUNTER — Encounter: Payer: Self-pay | Admitting: Genetic Counselor

## 2013-10-15 ENCOUNTER — Other Ambulatory Visit: Payer: Self-pay

## 2013-10-15 ENCOUNTER — Ambulatory Visit (HOSPITAL_BASED_OUTPATIENT_CLINIC_OR_DEPARTMENT_OTHER): Payer: Medicare Other | Admitting: Genetic Counselor

## 2013-10-15 DIAGNOSIS — Z803 Family history of malignant neoplasm of breast: Secondary | ICD-10-CM | POA: Diagnosis not present

## 2013-10-15 DIAGNOSIS — IMO0002 Reserved for concepts with insufficient information to code with codable children: Secondary | ICD-10-CM

## 2013-10-15 NOTE — Progress Notes (Signed)
Patient Name: [name] Patient Age: 53 y.o. Encounter Date: 10/15/2013  Referring Physician: Uvaldo Rising, MD  Primary Care Provider: Gavin Pound, MD   Ms. NELA BASCOM, a 53 y.o. female, is being seen at the [facility] due to a family history of breast cancer.  She presents to clinic today with her mother to discuss the possibility of a hereditary predisposition to cancer and discuss whether genetic testing is warranted.  HISTORY OF PRESENT ILLNESS: Ms. Avis has no personal history of cancer. She reports having a yearly gynecologic exam, clinical breast exam and mammogram. A colonoscopy in 2009 was negative for polyps.  Past Medical History  Diagnosis Date  . Depression   . Migraine   . Fibromyalgia   . Hearing impairment   . Arthritis   . Cardiomyopathy   . Pulmonary embolism   . Gastritis   . Hypertension   . Osteoporosis     Past Surgical History  Procedure Laterality Date  . Appendectomy    . Inner ear surgery      TUBES  . Tonsillectomy and adenoidectomy    . Tubal ligation    . Laparoscopic cholecystectomy  2012  . Cardiac surgery  January 2007    mitral valve repair  . Cardiac surgery  1973    atrial septal defect    History   Social History  . Marital Status: Divorced    Spouse Name: N/A    Number of Children: N/A  . Years of Education: N/A   Social History Main Topics  . Smoking status: Never Smoker   . Smokeless tobacco: Not on file  . Alcohol Use: No  . Drug Use: No  . Sexual Activity: No   Other Topics Concern  . Not on file   Social History Narrative  . No narrative on file     FAMILY HISTORY:   During the visit, a 4-generation pedigree was obtained. Significant diagnoses include the following:  Family History  Problem Relation Age of Onset  . Breast cancer Mother     bilateral; ages 75 and 54; TAH/BSO ~50  . Depression Sister   . Heart disease Father   . Hypertension Father     Additionally, Ms. Kobel  has a son (age 19) and a daughter (age 3). Her only sibling is a sister (age 42) who is cancer-free. Her father died at 19 due to an MI. He has a brother who is 32 and cancer-free.  Ms. Tetro ancestry is Caucasian. There is no known Jewish ancestry and no consanguinity.  ASSESSMENT AND PLAN: Ms. Bowermaster is a 53 y.o. female with a family history of bilateral breast cancer in her mother who was diagnosed at ages 26 and 56. This history is not highly suggestive of a hereditary predisposition to cancer, but genetic testing in her family is warranted. Specifically, we discussed her mother pursuing testing. We reviewed the characteristics, features and inheritance patterns of hereditary cancer syndromes. We also discussed genetic testing, including the appropriate family members to test, the process of testing, insurance coverage and implications of results. If Ms. Willden's mother's test is negative, there is no reason to test Ms. Henrene Pastor, but she understood that her risk of breast cancer is slightly increased over general population risks due to the family history.  Based on Ms. Yordy's family history, we recommended her mother, who was diagnosed with bilateral breast cancer, have genetic testing. We discussed that it is always most informative to initiate genetic testing  in a family member diagnosed with cancer. Ms. Authier mother was agreeable to proceed with testing today and had her blood drawn. Once we obtain her results, we will call her and address whether genetic testing for Ms. Kildow is indicated.    We encouraged Ms. Brecheen to remain in contact with [facility] annually so that we can update the family history and inform her of any changes in cancer genetics and testing that may be of benefit for this family. Ms.  Navedo questions were answered to her satisfaction today.   Thank you for the referral and allowing [place] to share in the care of your patient.   The patient was seen for a total of 30  minutes, greater than 50% of which was spent face-to-face counseling. This patient was discussed with the overseeing provider who agrees with the above.

## 2013-10-17 DIAGNOSIS — M542 Cervicalgia: Secondary | ICD-10-CM | POA: Diagnosis not present

## 2013-10-17 DIAGNOSIS — IMO0001 Reserved for inherently not codable concepts without codable children: Secondary | ICD-10-CM | POA: Diagnosis not present

## 2013-10-17 DIAGNOSIS — F3289 Other specified depressive episodes: Secondary | ICD-10-CM | POA: Diagnosis not present

## 2013-10-17 DIAGNOSIS — F329 Major depressive disorder, single episode, unspecified: Secondary | ICD-10-CM | POA: Diagnosis not present

## 2013-10-17 DIAGNOSIS — M949 Disorder of cartilage, unspecified: Secondary | ICD-10-CM | POA: Diagnosis not present

## 2013-10-17 DIAGNOSIS — G4733 Obstructive sleep apnea (adult) (pediatric): Secondary | ICD-10-CM | POA: Diagnosis not present

## 2013-10-17 DIAGNOSIS — G43909 Migraine, unspecified, not intractable, without status migrainosus: Secondary | ICD-10-CM | POA: Diagnosis not present

## 2013-10-17 DIAGNOSIS — N3941 Urge incontinence: Secondary | ICD-10-CM | POA: Diagnosis not present

## 2013-10-17 DIAGNOSIS — H9190 Unspecified hearing loss, unspecified ear: Secondary | ICD-10-CM | POA: Diagnosis not present

## 2013-10-17 DIAGNOSIS — M899 Disorder of bone, unspecified: Secondary | ICD-10-CM | POA: Diagnosis not present

## 2013-10-24 ENCOUNTER — Ambulatory Visit (HOSPITAL_COMMUNITY): Payer: Self-pay | Admitting: Psychiatry

## 2013-10-26 ENCOUNTER — Encounter (HOSPITAL_BASED_OUTPATIENT_CLINIC_OR_DEPARTMENT_OTHER): Payer: Self-pay | Admitting: Emergency Medicine

## 2013-10-26 ENCOUNTER — Emergency Department (HOSPITAL_BASED_OUTPATIENT_CLINIC_OR_DEPARTMENT_OTHER)
Admission: EM | Admit: 2013-10-26 | Discharge: 2013-10-26 | Disposition: A | Discharge status: Home / Self Care | Payer: Medicare Other | Attending: Emergency Medicine | Admitting: Emergency Medicine

## 2013-10-26 DIAGNOSIS — I10 Essential (primary) hypertension: Secondary | ICD-10-CM | POA: Diagnosis not present

## 2013-10-26 DIAGNOSIS — M129 Arthropathy, unspecified: Secondary | ICD-10-CM | POA: Insufficient documentation

## 2013-10-26 DIAGNOSIS — G43909 Migraine, unspecified, not intractable, without status migrainosus: Secondary | ICD-10-CM | POA: Diagnosis not present

## 2013-10-26 DIAGNOSIS — Z862 Personal history of diseases of the blood and blood-forming organs and certain disorders involving the immune mechanism: Secondary | ICD-10-CM | POA: Insufficient documentation

## 2013-10-26 DIAGNOSIS — Z79899 Other long term (current) drug therapy: Secondary | ICD-10-CM | POA: Diagnosis not present

## 2013-10-26 DIAGNOSIS — F411 Generalized anxiety disorder: Secondary | ICD-10-CM | POA: Diagnosis not present

## 2013-10-26 DIAGNOSIS — IMO0001 Reserved for inherently not codable concepts without codable children: Secondary | ICD-10-CM | POA: Insufficient documentation

## 2013-10-26 DIAGNOSIS — K219 Gastro-esophageal reflux disease without esophagitis: Secondary | ICD-10-CM | POA: Insufficient documentation

## 2013-10-26 DIAGNOSIS — R1110 Vomiting, unspecified: Secondary | ICD-10-CM | POA: Diagnosis not present

## 2013-10-26 DIAGNOSIS — R109 Unspecified abdominal pain: Secondary | ICD-10-CM | POA: Insufficient documentation

## 2013-10-26 DIAGNOSIS — F329 Major depressive disorder, single episode, unspecified: Secondary | ICD-10-CM | POA: Diagnosis not present

## 2013-10-26 DIAGNOSIS — M810 Age-related osteoporosis without current pathological fracture: Secondary | ICD-10-CM | POA: Insufficient documentation

## 2013-10-26 DIAGNOSIS — Z86711 Personal history of pulmonary embolism: Secondary | ICD-10-CM | POA: Diagnosis not present

## 2013-10-26 DIAGNOSIS — Z9889 Other specified postprocedural states: Secondary | ICD-10-CM | POA: Insufficient documentation

## 2013-10-26 DIAGNOSIS — Z7982 Long term (current) use of aspirin: Secondary | ICD-10-CM | POA: Insufficient documentation

## 2013-10-26 DIAGNOSIS — H9190 Unspecified hearing loss, unspecified ear: Secondary | ICD-10-CM | POA: Diagnosis not present

## 2013-10-26 DIAGNOSIS — F3289 Other specified depressive episodes: Secondary | ICD-10-CM | POA: Diagnosis not present

## 2013-10-26 DIAGNOSIS — R1111 Vomiting without nausea: Secondary | ICD-10-CM

## 2013-10-26 LAB — URINALYSIS, ROUTINE W REFLEX MICROSCOPIC
BILIRUBIN URINE: NEGATIVE
Glucose, UA: NEGATIVE mg/dL
KETONES UR: NEGATIVE mg/dL
Leukocytes, UA: NEGATIVE
NITRITE: NEGATIVE
PROTEIN: NEGATIVE mg/dL
Specific Gravity, Urine: 1.022 (ref 1.005–1.030)
Urobilinogen, UA: 0.2 mg/dL (ref 0.0–1.0)
pH: 6 (ref 5.0–8.0)

## 2013-10-26 LAB — CBC WITH DIFFERENTIAL/PLATELET
Basophils Absolute: 0 10*3/uL (ref 0.0–0.1)
Basophils Relative: 1 % (ref 0–1)
Eosinophils Absolute: 0.1 10*3/uL (ref 0.0–0.7)
Eosinophils Relative: 2 % (ref 0–5)
HEMATOCRIT: 38.3 % (ref 36.0–46.0)
Hemoglobin: 13 g/dL (ref 12.0–15.0)
LYMPHS PCT: 21 % (ref 12–46)
Lymphs Abs: 0.9 10*3/uL (ref 0.7–4.0)
MCH: 32.1 pg (ref 26.0–34.0)
MCHC: 33.9 g/dL (ref 30.0–36.0)
MCV: 94.6 fL (ref 78.0–100.0)
MONO ABS: 0.4 10*3/uL (ref 0.1–1.0)
Monocytes Relative: 9 % (ref 3–12)
NEUTROS ABS: 2.9 10*3/uL (ref 1.7–7.7)
Neutrophils Relative %: 67 % (ref 43–77)
Platelets: 192 10*3/uL (ref 150–400)
RBC: 4.05 MIL/uL (ref 3.87–5.11)
RDW: 13.1 % (ref 11.5–15.5)
WBC: 4.2 10*3/uL (ref 4.0–10.5)

## 2013-10-26 LAB — COMPREHENSIVE METABOLIC PANEL
ALT: 20 U/L (ref 0–35)
AST: 26 U/L (ref 0–37)
Albumin: 3.7 g/dL (ref 3.5–5.2)
Alkaline Phosphatase: 66 U/L (ref 39–117)
BILIRUBIN TOTAL: 0.3 mg/dL (ref 0.3–1.2)
BUN: 23 mg/dL (ref 6–23)
CHLORIDE: 106 meq/L (ref 96–112)
CO2: 23 meq/L (ref 19–32)
CREATININE: 0.8 mg/dL (ref 0.50–1.10)
Calcium: 8.9 mg/dL (ref 8.4–10.5)
GFR calc Af Amer: >90 mL/min (ref >90)
GFR, EST NON AFRICAN AMERICAN: 83 mL/min — AB (ref >90)
GLUCOSE: 104 mg/dL — AB (ref 70–99)
Potassium: 4.2 mEq/L (ref 3.7–5.3)
Sodium: 142 mEq/L (ref 137–147)
Total Protein: 6.6 g/dL (ref 6.0–8.3)

## 2013-10-26 LAB — URINE MICROSCOPIC-ADD ON

## 2013-10-26 LAB — LIPASE, BLOOD: LIPASE: 47 U/L (ref 11–59)

## 2013-10-26 MED ORDER — PANTOPRAZOLE SODIUM 40 MG IV SOLR
40.0000 mg | Freq: Once | INTRAVENOUS | Status: AC
  Administered 2013-10-26: 40 mg via INTRAVENOUS
  Filled 2013-10-26: qty 40

## 2013-10-26 MED ORDER — PROMETHAZINE HCL 25 MG PO TABS: 25.0000 mg | ORAL_TABLET | Freq: Four times a day (QID) | ORAL | Status: DC | PRN

## 2013-10-26 MED ORDER — PANTOPRAZOLE SODIUM 20 MG PO TBEC: 20.0000 mg | DELAYED_RELEASE_TABLET | Freq: Every day | ORAL | Status: DC

## 2013-10-26 MED ORDER — HYDROMORPHONE HCL PF 1 MG/ML IJ SOLN
1.0000 mg | Freq: Once | INTRAMUSCULAR | Status: AC
  Administered 2013-10-26: 1 mg via INTRAVENOUS
  Filled 2013-10-26: qty 1

## 2013-10-26 MED ORDER — SODIUM CHLORIDE 0.9 % IV BOLUS (SEPSIS)
1000.0000 mL | Freq: Once | INTRAVENOUS | Status: AC
  Administered 2013-10-26: 1000 mL via INTRAVENOUS

## 2013-10-26 MED ORDER — ONDANSETRON HCL 4 MG/2ML IJ SOLN
4.0000 mg | Freq: Once | INTRAMUSCULAR | Status: AC
  Administered 2013-10-26: 4 mg via INTRAVENOUS
  Filled 2013-10-26: qty 2

## 2013-10-26 NOTE — ED Provider Notes (Signed)
  Medical screening examination/treatment/procedure(s) were performed by non-physician practitioner and as supervising physician I was immediately available for consultation/collaboration.   EKG Interpretation None         Carmin Muskrat, MD 10/26/13 1526

## 2013-10-26 NOTE — Discharge Instructions (Signed)

## 2013-10-26 NOTE — ED Provider Notes (Signed)
CSN: [id number]     Arrival date & time 10/26/13  1112 History   First MD Initiated Contact with Patient 10/26/13 1119     Chief Complaint  Patient presents with  . Emesis     (Consider location/radiation/quality/duration/timing/severity/associated sxs/prior Treatment) Patient is a 53 y.o. female presenting with vomiting. The history is provided by the patient. No language interpreter was used.  Emesis Severity:  Moderate Duration:  5 days Timing:  Constant Number of daily episodes:  Multiple Quality:  Unable to specify Able to tolerate:  Liquids Progression:  Unchanged Recent urination:  Normal Relieved by:  Nothing Worsened by:  Nothing tried Ineffective treatments:  None tried Associated symptoms: abdominal pain   Risk factors: no diabetes   Pt is scheduled for upper and lower gi studies next week.Pt has a history of pncreatitis  Past Medical History  Diagnosis Date  . Depression   . Migraine   . Fibromyalgia   . Hearing impairment   . Arthritis   . Cardiomyopathy   . Pulmonary embolism   . Gastritis   . Hypertension   . Osteoporosis    Past Surgical History  Procedure Laterality Date  . Appendectomy    . Inner ear surgery      TUBES  . Tonsillectomy and adenoidectomy    . Tubal ligation    . Laparoscopic cholecystectomy  2012  . Cardiac surgery  January 2007    mitral valve repair  . Cardiac surgery  1973    atrial septal defect   Family History  Problem Relation Age of Onset  . Breast cancer Mother     bilateral; ages 9 and 16; TAH/BSO ~50  . Depression Sister   . Heart disease Father   . Hypertension Father    History  Substance Use Topics  . Smoking status: Never Smoker   . Smokeless tobacco: Not on file  . Alcohol Use: No   OB History   Grav Para Term Preterm Abortions TAB SAB Ect Mult Living   '2 2 2       2     ' Review of Systems  Gastrointestinal: Positive for vomiting and abdominal pain.  All other systems reviewed and are  negative.     Allergies  Depakote and Valium  Home Medications   Prior to Admission medications   Medication Sig Start Date End Date Taking? Authorizing Provider  ARIPiprazole (ABILIFY) 10 MG tablet Take 10 mg by mouth daily.    Historical Provider, MD  aspirin EC 81 MG tablet Take 81 mg by mouth every morning.    Historical Provider, MD  BuPROPion HCl ER, XL, (FORFIVO XL) 450 MG TB24 Take 450 mg by mouth every morning. 08/22/13   Norma Fredrickson, MD  butalbital-acetaminophen-caffeine (FIORICET, ESGIC) [phone] MG per tablet Take 1 tablet by mouth 2 (two) times daily as needed for headache.    Historical Provider, MD  Calcium Carbonate-Vitamin D (CALCIUM 600 + D PO) Take 1 tablet by mouth 2 (two) times daily.    Historical Provider, MD  carvedilol (COREG) 25 MG tablet Take 1 tablet (25 mg total) by mouth 2 (two) times daily with a meal. 07/23/13   Candee Furbish, MD  desvenlafaxine (PRISTIQ) 100 MG 24 hr tablet Take 1 tablet (100 mg total) by mouth daily. 08/22/13   Norma Fredrickson, MD  Flaxseed, Linseed, (FLAXSEED OIL) 1000 MG CAPS Take 1,000 mg by mouth 2 (two) times daily.    Historical Provider, MD  hydrochlorothiazide (MICROZIDE) 12.5 MG capsule  Take 12.5 mg by mouth every morning.     Historical Provider, MD  HYDROcodone-acetaminophen (NORCO) 5-325 MG per tablet Take 2 tablets by mouth every 4 (four) hours as needed. 09/17/13   Veryl Speak, MD  loratadine (CLARITIN) 10 MG tablet Take 10 mg by mouth daily.    Historical Provider, MD  mirabegron ER (MYRBETRIQ) 25 MG TB24 tablet Take 1 tablet (25 mg total) by mouth daily. 06/13/13   Terrance Mass, MD  MOVIPREP 100 G SOLR Take 1 kit (200 g total) by mouth as directed. 10/02/13   Amy S Esterwood, PA-C  Multiple Vitamin (MULTIVITAMIN) tablet Take 1 tablet by mouth daily.     Historical Provider, MD   BP 143/95  Pulse 104  Temp(Src) 98.1 F (36.7 C) (Oral)  Resp 16  Ht '5\' 5"'  (1.651 m)  Wt 152 lb (68.947 kg)  BMI 25.29 kg/m2  SpO2  99% Physical Exam  Nursing note and vitals reviewed. Constitutional: She is oriented to person, place, and time. She appears well-developed and well-nourished.  HENT:  Head: Normocephalic and atraumatic.  Right Ear: External ear normal.  Left Ear: External ear normal.  Mouth/Throat: Oropharynx is clear and moist.  Eyes: Conjunctivae and EOM are normal. Pupils are equal, round, and reactive to light.  Neck: Normal range of motion.  Cardiovascular: Normal rate and normal heart sounds.   Pulmonary/Chest: Effort normal.  Abdominal: She exhibits no distension.  Musculoskeletal: Normal range of motion.  Neurological: She is alert and oriented to person, place, and time.  Skin: Skin is warm.  Psychiatric: She has a normal mood and affect.    ED Course  Procedures (including critical care time) Labs Review Labs Reviewed  COMPREHENSIVE METABOLIC PANEL - Abnormal; Notable for the following:    Glucose, Bld 104 (*)    GFR calc non Af Amer 83 (*)    All other components within normal limits  CBC WITH DIFFERENTIAL  LIPASE, BLOOD  URINALYSIS, ROUTINE W REFLEX MICROSCOPIC    Imaging Review No results found.   EKG Interpretation None     Results for orders placed during the hospital encounter of 10/26/13  CBC WITH DIFFERENTIAL      Result Value Ref Range   WBC 4.2  4.0 - 10.5 K/uL   RBC 4.05  3.87 - 5.11 MIL/uL   Hemoglobin 13.0  12.0 - 15.0 g/dL   HCT 38.3  36.0 - 46.0 %   MCV 94.6  78.0 - 100.0 fL   MCH 32.1  26.0 - 34.0 pg   MCHC 33.9  30.0 - 36.0 g/dL   RDW 13.1  11.5 - 15.5 %   Platelets 192  150 - 400 K/uL   Neutrophils Relative % 67  43 - 77 %   Neutro Abs 2.9  1.7 - 7.7 K/uL   Lymphocytes Relative 21  12 - 46 %   Lymphs Abs 0.9  0.7 - 4.0 K/uL   Monocytes Relative 9  3 - 12 %   Monocytes Absolute 0.4  0.1 - 1.0 K/uL   Eosinophils Relative 2  0 - 5 %   Eosinophils Absolute 0.1  0.0 - 0.7 K/uL   Basophils Relative 1  0 - 1 %   Basophils Absolute 0.0  0.0 - 0.1 K/uL   COMPREHENSIVE METABOLIC PANEL      Result Value Ref Range   Sodium 142  137 - 147 mEq/L   Potassium 4.2  3.7 - 5.3 mEq/L   Chloride 106  96 - 112 mEq/L   CO2 23  19 - 32 mEq/L   Glucose, Bld 104 (*) 70 - 99 mg/dL   BUN 23  6 - 23 mg/dL   Creatinine, Ser 0.80  0.50 - 1.10 mg/dL   Calcium 8.9  8.4 - 10.5 mg/dL   Total Protein 6.6  6.0 - 8.3 g/dL   Albumin 3.7  3.5 - 5.2 g/dL   AST 26  0 - 37 U/L   ALT 20  0 - 35 U/L   Alkaline Phosphatase 66  39 - 117 U/L   Total Bilirubin 0.3  0.3 - 1.2 mg/dL   GFR calc non Af Amer 83 (*) >90 mL/min   GFR calc Af Amer >90  >90 mL/min  LIPASE, BLOOD      Result Value Ref Range   Lipase 47  11 - 59 U/L  URINALYSIS, ROUTINE W REFLEX MICROSCOPIC      Result Value Ref Range   Color, Urine YELLOW  YELLOW   APPearance CLEAR  CLEAR   Specific Gravity, Urine 1.022  1.005 - 1.030   pH 6.0  5.0 - 8.0   Glucose, UA NEGATIVE  NEGATIVE mg/dL   Hgb urine dipstick SMALL (*) NEGATIVE   Bilirubin Urine NEGATIVE  NEGATIVE   Ketones, ur NEGATIVE  NEGATIVE mg/dL   Protein, ur NEGATIVE  NEGATIVE mg/dL   Urobilinogen, UA 0.2  0.0 - 1.0 mg/dL   Nitrite NEGATIVE  NEGATIVE   Leukocytes, UA NEGATIVE  NEGATIVE  URINE MICROSCOPIC-ADD ON      Result Value Ref Range   Squamous Epithelial / LPF RARE  RARE   WBC, UA 0-2  <3 WBC/hpf   RBC / HPF 0-2  <3 RBC/hpf   Bacteria, UA RARE  RARE   No results found.  MDM   Final diagnoses:  Vomiting without nausea, vomiting of unspecified type    Pt given IV fluids and protonix.   Labs are normal.    I advised I will start rx for protonix.   Pt advised to keep followup appointments for gi studies   Fransico Meadow, PA-C 10/26/13 1354

## 2013-10-26 NOTE — ED Notes (Signed)
Pt reports N/V x several days.  States hx of pancreatitis.  Reports that it feels the same.  Epigastric pain.

## 2013-10-29 ENCOUNTER — Encounter: Payer: Self-pay | Admitting: Gastroenterology

## 2013-10-29 ENCOUNTER — Ambulatory Visit (AMBULATORY_SURGERY_CENTER): Payer: Medicare Other | Admitting: Gastroenterology

## 2013-10-29 VITALS — BP 135/81 | HR 68 | Temp 97.5°F | Resp 13 | Ht 65.0 in | Wt 149.0 lb

## 2013-10-29 DIAGNOSIS — K319 Disease of stomach and duodenum, unspecified: Secondary | ICD-10-CM | POA: Diagnosis not present

## 2013-10-29 DIAGNOSIS — K2970 Gastritis, unspecified, without bleeding: Secondary | ICD-10-CM | POA: Diagnosis not present

## 2013-10-29 DIAGNOSIS — K2990 Gastroduodenitis, unspecified, without bleeding: Secondary | ICD-10-CM | POA: Diagnosis not present

## 2013-10-29 DIAGNOSIS — IMO0001 Reserved for inherently not codable concepts without codable children: Secondary | ICD-10-CM | POA: Diagnosis not present

## 2013-10-29 DIAGNOSIS — K449 Diaphragmatic hernia without obstruction or gangrene: Secondary | ICD-10-CM | POA: Diagnosis not present

## 2013-10-29 DIAGNOSIS — B3781 Candidal esophagitis: Secondary | ICD-10-CM | POA: Diagnosis not present

## 2013-10-29 DIAGNOSIS — I10 Essential (primary) hypertension: Secondary | ICD-10-CM | POA: Diagnosis not present

## 2013-10-29 DIAGNOSIS — F341 Dysthymic disorder: Secondary | ICD-10-CM | POA: Diagnosis not present

## 2013-10-29 DIAGNOSIS — D126 Benign neoplasm of colon, unspecified: Secondary | ICD-10-CM

## 2013-10-29 DIAGNOSIS — K5730 Diverticulosis of large intestine without perforation or abscess without bleeding: Secondary | ICD-10-CM | POA: Diagnosis not present

## 2013-10-29 DIAGNOSIS — I059 Rheumatic mitral valve disease, unspecified: Secondary | ICD-10-CM | POA: Diagnosis not present

## 2013-10-29 DIAGNOSIS — D649 Anemia, unspecified: Secondary | ICD-10-CM | POA: Diagnosis not present

## 2013-10-29 DIAGNOSIS — R195 Other fecal abnormalities: Secondary | ICD-10-CM | POA: Diagnosis not present

## 2013-10-29 MED ORDER — FLUCONAZOLE 100 MG PO TABS: 100.0000 mg | ORAL_TABLET | Freq: Every day | ORAL | Status: DC

## 2013-10-29 MED ORDER — PANTOPRAZOLE SODIUM 40 MG PO TBEC: 40.0000 mg | DELAYED_RELEASE_TABLET | Freq: Every day | ORAL | Status: DC

## 2013-10-29 MED ORDER — SODIUM CHLORIDE 0.9 % IV SOLN: 500.0000 mL | INTRAVENOUS | Status: DC

## 2013-10-29 NOTE — Op Note (Signed)
[facility]  [address]. [place], [id number]   ENDOSCOPY PROCEDURE REPORT  PATIENT: [name], [name]  MR#: [id number] BIRTHDATE: [birth year] , 9  yrs. old GENDER: Female ENDOSCOPIST: Milus Banister, MD PROCEDURE DATE:  10/29/2013 PROCEDURE:  EGD w/ biopsy ASA CLASS:     Class II INDICATIONS:  normocytic, mild Anemia, FOBT +; also recent vomiting and abd pains, daily ASA, frequent NSAIDs. MEDICATIONS: MAC sedation, administered by CRNA and Propofol (Diprivan) 120 mg IV TOPICAL ANESTHETIC: none  DESCRIPTION OF PROCEDURE: After the risks benefits and alternatives of the procedure were thoroughly explained, informed consent was obtained.  The LB [id number] endoscope was introduced through the mouth and advanced to the second portion of the duodenum. Without limitations.  The instrument was slowly withdrawn as the mucosa was fully examined.   There was moderate, non-specific distal gastritis.  This was biopsied and sent to pathology.  There was a 3cm hiatal hernia. There was obvious but mild manilia esophagitis.  The examination was otherwise normal.  Retroflexed views revealed no abnormalities. The scope was then withdrawn from the patient and the procedure completed.  COMPLICATIONS: There were no complications. ENDOSCOPIC IMPRESSION: There was moderate, non-specific distal gastritis.  This was biopsied and sent to pathology.  There was a 3cm hiatal hernia. There was obvious but mild manilia esophagitis.  The examination was otherwise normal.  RECOMMENDATIONS: Continue protonix once daily.  You should stay on this for as long as you continue to need daily ASA.  You should try to refrain from NSAID use as much as possible (including Alleve, motrin, ibuprofen, etc).  If the biopsies show H.  pylori, you will be started on appropriate antibiotics.  A new medicine (diflucan) was called in today to treat the yeast infection in your esophagus, take one  pill once daily for 10 days.   eSigned:  Milus Banister, MD 10/29/2013 1:51 PM

## 2013-10-29 NOTE — Progress Notes (Signed)
Report to PACU, RN, vss, BBS= Clear.  

## 2013-10-29 NOTE — Op Note (Signed)
[facility]  [address]. [place], [id number]   COLONOSCOPY PROCEDURE REPORT  PATIENT: [name], [name]  MR#: [id number] BIRTHDATE: [birth year] , 18  yrs. old GENDER: Female ENDOSCOPIST: Milus Banister, MD REFERRED BY: Darreld Mclean, MD PROCEDURE DATE:  10/29/2013 PROCEDURE:   Colonoscopy with snare polypectomy First Screening Colonoscopy - Avg.  risk and is 50 yrs.  old or older - No.  Prior Negative Screening - Now for repeat screening. Other: See Comments  History of Adenoma - Now for follow-up colonoscopy & has been > or = to 3 yrs.  N/A  Polyps Removed Today? Yes. ASA CLASS:   Class II INDICATIONS:FOBT positive, mild anemia (not iron def, not microcytic). MEDICATIONS: MAC sedation, administered by CRNA and Propofol (Diprivan) 210 mg IV  DESCRIPTION OF PROCEDURE:   After the risks benefits and alternatives of the procedure were thoroughly explained, informed consent was obtained.  A digital rectal exam revealed no abnormalities of the rectum.   The LB [id number]  endoscope was introduced through the anus and advanced to the cecum, which was identified by both the appendix and ileocecal valve. No adverse events experienced.   The quality of the prep was excellent.  The instrument was then slowly withdrawn as the colon was fully examined.  COLON FINDINGS: One polyp was found, removed but not retrieved. This was sessile, 53mm across, located in transverse segment, removed with cold snare.  There were a few small diverticulum in the left colon.  The examination was otherwise normal.  Retroflexed views revealed no abnormalities. The time to cecum=2 minutes 27 seconds.  Withdrawal time=6 minutes 14 seconds.  The scope was withdrawn and the procedure completed. COMPLICATIONS: There were no complications.  ENDOSCOPIC IMPRESSION: One polyp was found, removed but not retrieved. There were a few small diverticulum in the left colon.  The examination  was otherwise normal.  RECOMMENDATIONS: You should continue to follow colorectal cancer screening guidelines with a repeat colonoscopy in 10 years. Will proceed with EGD now.  eSigned:  Milus Banister, MD 10/29/2013 1:41 PM

## 2013-10-29 NOTE — Progress Notes (Signed)
Called to room to assist during endoscopic procedure.  Patient ID and intended procedure confirmed with present staff. Received instructions for my participation in the procedure from the performing physician.  

## 2013-10-29 NOTE — Patient Instructions (Signed)
YOU HAD AN ENDOSCOPIC PROCEDURE TODAY AT [facility]: Refer to the procedure report that was given to you for any specific questions about what was found during the examination.  If the procedure report does not answer your questions, please call your gastroenterologist to clarify.  If you requested that your care partner not be given the details of your procedure findings, then the procedure report has been included in a sealed envelope for you to review at your convenience later.  YOU SHOULD EXPECT: Some feelings of bloating in the abdomen. Passage of more gas than usual.  Walking can help get rid of the air that was put into your GI tract during the procedure and reduce the bloating. If you had a lower endoscopy (such as a colonoscopy or flexible sigmoidoscopy) you may notice spotting of blood in your stool or on the toilet paper. If you underwent a bowel prep for your procedure, then you may not have a normal bowel movement for a few days.  DIET: Your first meal following the procedure should be a light meal and then it is ok to progress to your normal diet.  A half-sandwich or bowl of soup is an example of a good first meal.  Heavy or fried foods are harder to digest and may make you feel nauseous or bloated.  Likewise meals heavy in dairy and vegetables can cause extra gas to form and this can also increase the bloating.  Drink plenty of fluids but you should avoid alcoholic beverages for 24 hours. You should probably try to increase the fiber in your diet when your stomach is better.  ACTIVITY: Your care partner should take you home directly after the procedure.  You should plan to take it easy, moving slowly for the rest of the day.  You can resume normal activity the day after the procedure however you should NOT DRIVE or use heavy machinery for 24 hours (because of the sedation medicines used during the test).    SYMPTOMS TO REPORT IMMEDIATELY: A gastroenterologist can be reached  at any hour.  During normal business hours, 8:30 AM to 5:00 PM Monday through Friday, call [phone].  After hours and on weekends, please call the GI answering service at [phone] who will take a message and have the physician on call contact you.   Following lower endoscopy (colonoscopy or flexible sigmoidoscopy):  Excessive amounts of blood in the stool  Significant tenderness or worsening of abdominal pains  Swelling of the abdomen that is new, acute  Fever of 100F or higher  Following upper endoscopy (EGD)  Vomiting of blood or coffee ground material  New chest pain or pain under the shoulder blades  Painful or persistently difficult swallowing  New shortness of breath  Fever of 100F or higher  Black, tarry-looking stools  FOLLOW UP: If any biopsies were taken you will be contacted by phone or by letter within the next 1-3 weeks.  Call your gastroenterologist if you have not heard about the biopsies in 3 weeks.  Our staff will call the home number listed on your records the next business day following your procedure to check on you and address any questions or concerns that you may have at that time regarding the information given to you following your procedure. This is a courtesy call and so if there is no answer at the home number and we have not heard from you through the emergency physician on call, we will assume that  you have returned to your regular daily activities without incident.  SIGNATURES/CONFIDENTIALITY: You and/or your care partner have signed paperwork which will be entered into your electronic medical record.  These signatures attest to the fact that that the information above on your After Visit Summary has been reviewed and is understood.  Full responsibility of the confidentiality of this discharge information lies with you and/or your care-partner.  Try to avoid advil and ibuprofen;  Dr. Ardis Hughs has ordered Diflucan and Protonix.  The protonix is to be  taken as long as you need to take aspirin.  The Diflucan will help to cure the yeast infection in your esophagus.   The stomach is very irritated, thus you need to take your protonix every day. Dr. Ardis Hughs also tested to see ifyou have H-pylori infection.   If you do, you will need more antibiotics.

## 2013-10-30 ENCOUNTER — Telehealth: Payer: Self-pay | Admitting: *Deleted

## 2013-10-30 NOTE — Telephone Encounter (Signed)
  Follow up Call-  Call back number 10/29/2013  Post procedure Call Back phone  # [phone]  Permission to leave phone message Yes     Patient questions:  Do you have a fever, pain , or abdominal swelling? no Pain Score  0 *  Have you tolerated food without any problems? yes  Have you been able to return to your normal activities? yes  Do you have any questions about your discharge instructions: Diet   no Medications  no Follow up visit  no  Do you have questions or concerns about your Care? no  Actions: * If pain score is 4 or above: No action needed, pain <4.

## 2013-11-03 ENCOUNTER — Emergency Department (HOSPITAL_COMMUNITY)
Admission: EM | Admit: 2013-11-03 | Discharge: 2013-11-03 | Disposition: A | Discharge status: Home / Self Care | Payer: Medicare Other | Attending: Emergency Medicine | Admitting: Emergency Medicine

## 2013-11-03 ENCOUNTER — Encounter (HOSPITAL_COMMUNITY): Payer: Self-pay | Admitting: Emergency Medicine

## 2013-11-03 DIAGNOSIS — Z862 Personal history of diseases of the blood and blood-forming organs and certain disorders involving the immune mechanism: Secondary | ICD-10-CM | POA: Diagnosis not present

## 2013-11-03 DIAGNOSIS — IMO0001 Reserved for inherently not codable concepts without codable children: Secondary | ICD-10-CM | POA: Insufficient documentation

## 2013-11-03 DIAGNOSIS — Z9889 Other specified postprocedural states: Secondary | ICD-10-CM | POA: Insufficient documentation

## 2013-11-03 DIAGNOSIS — Z7982 Long term (current) use of aspirin: Secondary | ICD-10-CM | POA: Insufficient documentation

## 2013-11-03 DIAGNOSIS — R1013 Epigastric pain: Secondary | ICD-10-CM | POA: Insufficient documentation

## 2013-11-03 DIAGNOSIS — F3289 Other specified depressive episodes: Secondary | ICD-10-CM | POA: Insufficient documentation

## 2013-11-03 DIAGNOSIS — I10 Essential (primary) hypertension: Secondary | ICD-10-CM | POA: Diagnosis not present

## 2013-11-03 DIAGNOSIS — F329 Major depressive disorder, single episode, unspecified: Secondary | ICD-10-CM | POA: Diagnosis not present

## 2013-11-03 DIAGNOSIS — R110 Nausea: Secondary | ICD-10-CM | POA: Diagnosis not present

## 2013-11-03 DIAGNOSIS — Z8669 Personal history of other diseases of the nervous system and sense organs: Secondary | ICD-10-CM | POA: Insufficient documentation

## 2013-11-03 DIAGNOSIS — M129 Arthropathy, unspecified: Secondary | ICD-10-CM | POA: Insufficient documentation

## 2013-11-03 DIAGNOSIS — M810 Age-related osteoporosis without current pathological fracture: Secondary | ICD-10-CM | POA: Diagnosis not present

## 2013-11-03 DIAGNOSIS — Z79899 Other long term (current) drug therapy: Secondary | ICD-10-CM | POA: Insufficient documentation

## 2013-11-03 DIAGNOSIS — K219 Gastro-esophageal reflux disease without esophagitis: Secondary | ICD-10-CM | POA: Insufficient documentation

## 2013-11-03 DIAGNOSIS — Z86711 Personal history of pulmonary embolism: Secondary | ICD-10-CM | POA: Insufficient documentation

## 2013-11-03 DIAGNOSIS — Z9861 Coronary angioplasty status: Secondary | ICD-10-CM | POA: Insufficient documentation

## 2013-11-03 DIAGNOSIS — F411 Generalized anxiety disorder: Secondary | ICD-10-CM | POA: Insufficient documentation

## 2013-11-03 LAB — URINALYSIS, ROUTINE W REFLEX MICROSCOPIC
Bilirubin Urine: NEGATIVE
Glucose, UA: NEGATIVE mg/dL
Ketones, ur: NEGATIVE mg/dL
Leukocytes, UA: NEGATIVE
Nitrite: NEGATIVE
Protein, ur: NEGATIVE mg/dL
Specific Gravity, Urine: 1.024 (ref 1.005–1.030)
Urobilinogen, UA: 0.2 mg/dL (ref 0.0–1.0)
pH: 6.5 (ref 5.0–8.0)

## 2013-11-03 LAB — CBC WITH DIFFERENTIAL/PLATELET
Basophils Absolute: 0.1 10*3/uL (ref 0.0–0.1)
Basophils Relative: 2 % — ABNORMAL HIGH (ref 0–1)
Eosinophils Absolute: 0 10*3/uL (ref 0.0–0.7)
Eosinophils Relative: 1 % (ref 0–5)
HCT: 43.9 % (ref 36.0–46.0)
Hemoglobin: 14.8 g/dL (ref 12.0–15.0)
Lymphocytes Relative: 22 % (ref 12–46)
Lymphs Abs: 0.8 10*3/uL (ref 0.7–4.0)
MCH: 31.8 pg (ref 26.0–34.0)
MCHC: 33.7 g/dL (ref 30.0–36.0)
MCV: 94.2 fL (ref 78.0–100.0)
Monocytes Absolute: 0.3 10*3/uL (ref 0.1–1.0)
Monocytes Relative: 9 % (ref 3–12)
Neutro Abs: 2.3 10*3/uL (ref 1.7–7.7)
Neutrophils Relative %: 66 % (ref 43–77)
Platelets: 250 10*3/uL (ref 150–400)
RBC: 4.66 MIL/uL (ref 3.87–5.11)
RDW: 13.8 % (ref 11.5–15.5)
WBC: 3.5 10*3/uL — ABNORMAL LOW (ref 4.0–10.5)

## 2013-11-03 LAB — COMPREHENSIVE METABOLIC PANEL
ALT: 32 U/L (ref 0–35)
AST: 46 U/L — ABNORMAL HIGH (ref 0–37)
Albumin: 4.3 g/dL (ref 3.5–5.2)
Alkaline Phosphatase: 63 U/L (ref 39–117)
BUN: 22 mg/dL (ref 6–23)
CO2: 25 mEq/L (ref 19–32)
Calcium: 8.9 mg/dL (ref 8.4–10.5)
Chloride: 97 mEq/L (ref 96–112)
Creatinine, Ser: 0.93 mg/dL (ref 0.50–1.10)
GFR calc Af Amer: 80 mL/min — ABNORMAL LOW (ref >90)
GFR calc non Af Amer: 69 mL/min — ABNORMAL LOW (ref >90)
Glucose, Bld: 118 mg/dL — ABNORMAL HIGH (ref 70–99)
Potassium: 4.4 mEq/L (ref 3.7–5.3)
Sodium: 140 mEq/L (ref 137–147)
Total Bilirubin: 0.5 mg/dL (ref 0.3–1.2)
Total Protein: 7.6 g/dL (ref 6.0–8.3)

## 2013-11-03 LAB — URINE MICROSCOPIC-ADD ON

## 2013-11-03 LAB — LIPASE, BLOOD: Lipase: 46 U/L (ref 11–59)

## 2013-11-03 MED ORDER — HYDROMORPHONE HCL PF 1 MG/ML IJ SOLN
1.0000 mg | Freq: Once | INTRAMUSCULAR | Status: AC
  Administered 2013-11-03: 1 mg via INTRAVENOUS
  Filled 2013-11-03: qty 1

## 2013-11-03 MED ORDER — ONDANSETRON HCL 4 MG/2ML IJ SOLN
4.0000 mg | Freq: Once | INTRAMUSCULAR | Status: AC
  Administered 2013-11-03: 4 mg via INTRAVENOUS
  Filled 2013-11-03: qty 2

## 2013-11-03 MED ORDER — OXYCODONE-ACETAMINOPHEN 5-325 MG PO TABS: 1.0000 | ORAL_TABLET | ORAL | Status: DC | PRN

## 2013-11-03 MED ORDER — SODIUM CHLORIDE 0.9 % IV BOLUS (SEPSIS)
1000.0000 mL | Freq: Once | INTRAVENOUS | Status: AC
  Administered 2013-11-03: 1000 mL via INTRAVENOUS

## 2013-11-03 MED ORDER — GI COCKTAIL ~~LOC~~
30.0000 mL | Freq: Once | ORAL | Status: AC
  Administered 2013-11-03: 30 mL via ORAL
  Filled 2013-11-03: qty 30

## 2013-11-03 MED ORDER — ONDANSETRON HCL 4 MG PO TABS: 4.0000 mg | ORAL_TABLET | Freq: Four times a day (QID) | ORAL | Status: DC

## 2013-11-03 NOTE — ED Notes (Signed)
Pt presents to department for evaluation of abdominal pain. Ongoing x1 month. Recently had colonoscopy and endoscopy, waiting on results. States she also has yeast infection in her throat, also states stomach is irritated. Also states nausea/vomiting. Pt is alert and oriented x4.

## 2013-11-03 NOTE — ED Provider Notes (Signed)
CSN: [id number]     Arrival date & time 11/03/13  1041 History   First MD Initiated Contact with Patient 11/03/13 1112     Chief Complaint  Patient presents with  . Abdominal Pain     (Consider location/radiation/quality/duration/timing/severity/associated sxs/prior Treatment) HPI  53yF with abdominal pain. Recent endoscopy in past week which showed candidal esophagitis which she has been on diflucan for and moderate, nonspecific gastritis. Reports has been on PPI since Monday or Tuesday. Colonoscopy was fairly unremarkable.  No change in symptoms. Pressure/bloating sensation in upper abdomen.   Past Medical History  Diagnosis Date  . Depression   . Migraine   . Fibromyalgia   . Hearing impairment   . Arthritis   . Cardiomyopathy   . Pulmonary embolism   . Gastritis   . Osteoporosis   . Anemia   . Allergy     SEASONAL  . Anxiety   . GERD (gastroesophageal reflux disease)   . Hypertension     [name], take htn medication to regulate heart beat.  . Sleep apnea    Past Surgical History  Procedure Laterality Date  . Appendectomy    . Inner ear surgery      TUBES  . Tonsillectomy and adenoidectomy    . Tubal ligation    . Laparoscopic cholecystectomy  2012  . Cardiac surgery  January 2007    mitral valve repair  . Cardiac surgery  1973    atrial septal defect   Family History  Problem Relation Age of Onset  . Breast cancer Mother     bilateral; ages 17 and 4; TAH/BSO ~50  . Depression Sister   . Heart disease Father   . Hypertension Father    History  Substance Use Topics  . Smoking status: Never Smoker   . Smokeless tobacco: Not on file  . Alcohol Use: No   OB History   Grav Para Term Preterm Abortions TAB SAB Ect Mult Living   2 2 2       2      Review of Systems  All systems reviewed and negative, other than as noted in HPI.   Allergies  Depakote and Valium  Home Medications   Prior to Admission medications   Medication Sig Start Date End Date  Taking? Authorizing Provider  aspirin EC 81 MG tablet Take 81 mg by mouth every morning.   Yes Historical Provider, MD  BuPROPion HCl ER, XL, (FORFIVO XL) 450 MG TB24 Take 450 mg by mouth every morning. 08/22/13  Yes Norma Fredrickson, MD  butalbital-acetaminophen-caffeine (FIORICET, ESGIC) [phone] MG per tablet Take 1 tablet by mouth 2 (two) times daily as needed for headache.   Yes Historical Provider, MD  Calcium Carbonate-Vitamin D (CALCIUM 600 + D PO) Take 1 tablet by mouth 2 (two) times daily.   Yes Historical Provider, MD  carvedilol (COREG) 25 MG tablet Take 1 tablet (25 mg total) by mouth 2 (two) times daily with a meal. 07/23/13  Yes Candee Furbish, MD  desvenlafaxine (PRISTIQ) 100 MG 24 hr tablet Take 1 tablet (100 mg total) by mouth daily. 08/22/13  Yes Norma Fredrickson, MD  Flaxseed, Linseed, (FLAXSEED OIL) 1000 MG CAPS Take 1,000 mg by mouth 2 (two) times daily.   Yes Historical Provider, MD  fluconazole (DIFLUCAN) 100 MG tablet Take 1 tablet (100 mg total) by mouth daily. 10/29/13  Yes Milus Banister, MD  hydrochlorothiazide (MICROZIDE) 12.5 MG capsule Take 12.5 mg by mouth every morning.  Yes Historical Provider, MD  HYDROcodone-acetaminophen (NORCO/VICODIN) 5-325 MG per tablet Take 1 tablet by mouth every 6 (six) hours as needed for moderate pain.   Yes Historical Provider, MD  loratadine (CLARITIN) 10 MG tablet Take 10 mg by mouth daily.   Yes Historical Provider, MD  mirabegron ER (MYRBETRIQ) 25 MG TB24 tablet Take 1 tablet (25 mg total) by mouth daily. 06/13/13  Yes Terrance Mass, MD  Multiple Vitamin (MULTIVITAMIN) tablet Take 1 tablet by mouth daily.    Yes Historical Provider, MD  pantoprazole (PROTONIX) 40 MG tablet Take 1 tablet (40 mg total) by mouth daily. 10/29/13  Yes Milus Banister, MD  promethazine (PHENERGAN) 25 MG tablet Take 1 tablet (25 mg total) by mouth every 6 (six) hours as needed for nausea or vomiting. 10/26/13  Yes Hollace Kinnier Sofia, PA-C   BP 138/84  Pulse 90   Temp(Src) 99 F (37.2 C) (Oral)  Resp 14  SpO2 100% Physical Exam  Nursing note and vitals reviewed. Constitutional: She appears well-developed and well-nourished. No distress.  HENT:  Head: Normocephalic and atraumatic.  Eyes: Conjunctivae are normal. Right eye exhibits no discharge. Left eye exhibits no discharge.  Neck: Neck supple.  Cardiovascular: Normal rate, regular rhythm and normal heart sounds.  Exam reveals no gallop and no friction rub.   No murmur heard. Pulmonary/Chest: Effort normal and breath sounds normal. No respiratory distress.  Abdominal: Soft. She exhibits no distension. There is no tenderness.  Genitourinary:  No cva tenderness  Musculoskeletal: She exhibits no edema and no tenderness.  Neurological: She is alert.  Skin: Skin is warm and dry.  Psychiatric: She has a normal mood and affect. Her behavior is normal. Thought content normal.    ED Course  Procedures (including critical care time) Labs Review Labs Reviewed  CBC WITH DIFFERENTIAL - Abnormal; Notable for the following:    WBC 3.5 (*)    Basophils Relative 2 (*)    All other components within normal limits  COMPREHENSIVE METABOLIC PANEL - Abnormal; Notable for the following:    Glucose, Bld 118 (*)    AST 46 (*)    GFR calc non Af Amer 69 (*)    GFR calc Af Amer 80 (*)    All other components within normal limits  URINALYSIS, ROUTINE W REFLEX MICROSCOPIC - Abnormal; Notable for the following:    Hgb urine dipstick SMALL (*)    All other components within normal limits  LIPASE, BLOOD  URINE MICROSCOPIC-ADD ON    Imaging Review No results found.   EKG Interpretation None      MDM   Final diagnoses:  Epigastric pain  Nausea    53yF with abdominal pain and nausea. Benign abdominal exam. W/u pretty unremarkable. Symptoms improved with meds. Low suspicion for emergent process.     Virgel Manifold, MD 11/07/13 [phone]

## 2013-11-03 NOTE — Discharge Instructions (Signed)

## 2013-11-03 NOTE — ED Notes (Signed)
Patient discharged to home with family. NAD.  

## 2013-11-06 ENCOUNTER — Telehealth: Payer: Self-pay | Admitting: Gastroenterology

## 2013-11-06 ENCOUNTER — Encounter: Payer: Self-pay | Admitting: Gastroenterology

## 2013-11-07 NOTE — Telephone Encounter (Signed)
Left message on machine to call back  

## 2013-11-07 NOTE — Telephone Encounter (Signed)
Pt has been given the results over the phone and aware that a letter has been mailed

## 2013-11-14 ENCOUNTER — Encounter (HOSPITAL_COMMUNITY): Payer: Self-pay | Admitting: Emergency Medicine

## 2013-11-14 ENCOUNTER — Emergency Department (HOSPITAL_COMMUNITY)
Admission: EM | Admit: 2013-11-14 | Discharge: 2013-11-14 | Disposition: A | Discharge status: Home / Self Care | Payer: Medicare Other | Attending: Emergency Medicine | Admitting: Emergency Medicine

## 2013-11-14 DIAGNOSIS — K219 Gastro-esophageal reflux disease without esophagitis: Secondary | ICD-10-CM | POA: Insufficient documentation

## 2013-11-14 DIAGNOSIS — F329 Major depressive disorder, single episode, unspecified: Secondary | ICD-10-CM | POA: Insufficient documentation

## 2013-11-14 DIAGNOSIS — R1013 Epigastric pain: Secondary | ICD-10-CM | POA: Diagnosis not present

## 2013-11-14 DIAGNOSIS — Z9851 Tubal ligation status: Secondary | ICD-10-CM | POA: Insufficient documentation

## 2013-11-14 DIAGNOSIS — Z9089 Acquired absence of other organs: Secondary | ICD-10-CM | POA: Diagnosis not present

## 2013-11-14 DIAGNOSIS — Z9889 Other specified postprocedural states: Secondary | ICD-10-CM | POA: Diagnosis not present

## 2013-11-14 DIAGNOSIS — F3289 Other specified depressive episodes: Secondary | ICD-10-CM | POA: Diagnosis not present

## 2013-11-14 DIAGNOSIS — Z862 Personal history of diseases of the blood and blood-forming organs and certain disorders involving the immune mechanism: Secondary | ICD-10-CM | POA: Diagnosis not present

## 2013-11-14 DIAGNOSIS — Z79899 Other long term (current) drug therapy: Secondary | ICD-10-CM | POA: Diagnosis not present

## 2013-11-14 DIAGNOSIS — I10 Essential (primary) hypertension: Secondary | ICD-10-CM | POA: Diagnosis not present

## 2013-11-14 DIAGNOSIS — Z7982 Long term (current) use of aspirin: Secondary | ICD-10-CM | POA: Diagnosis not present

## 2013-11-14 DIAGNOSIS — R197 Diarrhea, unspecified: Secondary | ICD-10-CM | POA: Diagnosis not present

## 2013-11-14 DIAGNOSIS — R112 Nausea with vomiting, unspecified: Secondary | ICD-10-CM | POA: Diagnosis not present

## 2013-11-14 DIAGNOSIS — M129 Arthropathy, unspecified: Secondary | ICD-10-CM | POA: Insufficient documentation

## 2013-11-14 DIAGNOSIS — Z86711 Personal history of pulmonary embolism: Secondary | ICD-10-CM | POA: Diagnosis not present

## 2013-11-14 DIAGNOSIS — F411 Generalized anxiety disorder: Secondary | ICD-10-CM | POA: Diagnosis not present

## 2013-11-14 DIAGNOSIS — Z8669 Personal history of other diseases of the nervous system and sense organs: Secondary | ICD-10-CM | POA: Diagnosis not present

## 2013-11-14 LAB — CBC WITH DIFFERENTIAL/PLATELET
Basophils Absolute: 0 10*3/uL (ref 0.0–0.1)
Basophils Relative: 1 % (ref 0–1)
Eosinophils Absolute: 0 10*3/uL (ref 0.0–0.7)
Eosinophils Relative: 1 % (ref 0–5)
HCT: 46.3 % — ABNORMAL HIGH (ref 36.0–46.0)
Hemoglobin: 16.1 g/dL — ABNORMAL HIGH (ref 12.0–15.0)
LYMPHS ABS: 1.2 10*3/uL (ref 0.7–4.0)
LYMPHS PCT: 25 % (ref 12–46)
MCH: 32 pg (ref 26.0–34.0)
MCHC: 34.8 g/dL (ref 30.0–36.0)
MCV: 92 fL (ref 78.0–100.0)
Monocytes Absolute: 0.5 10*3/uL (ref 0.1–1.0)
Monocytes Relative: 10 % (ref 3–12)
NEUTROS PCT: 63 % (ref 43–77)
Neutro Abs: 3.1 10*3/uL (ref 1.7–7.7)
PLATELETS: 331 10*3/uL (ref 150–400)
RBC: 5.03 MIL/uL (ref 3.87–5.11)
RDW: 13.8 % (ref 11.5–15.5)
WBC: 4.8 10*3/uL (ref 4.0–10.5)

## 2013-11-14 LAB — URINALYSIS, ROUTINE W REFLEX MICROSCOPIC
Bilirubin Urine: NEGATIVE
Glucose, UA: NEGATIVE mg/dL
Ketones, ur: NEGATIVE mg/dL
Nitrite: NEGATIVE
PH: 5.5 (ref 5.0–8.0)
PROTEIN: NEGATIVE mg/dL
Specific Gravity, Urine: 1.026 (ref 1.005–1.030)
Urobilinogen, UA: 0.2 mg/dL (ref 0.0–1.0)

## 2013-11-14 LAB — COMPREHENSIVE METABOLIC PANEL
ALK PHOS: 59 U/L (ref 39–117)
ALT: 25 U/L (ref 0–35)
AST: 43 U/L — AB (ref 0–37)
Albumin: 4.4 g/dL (ref 3.5–5.2)
Anion gap: 19 — ABNORMAL HIGH (ref 5–15)
BUN: 22 mg/dL (ref 6–23)
CO2: 21 meq/L (ref 19–32)
Calcium: 8.7 mg/dL (ref 8.4–10.5)
Chloride: 92 mEq/L — ABNORMAL LOW (ref 96–112)
Creatinine, Ser: 0.88 mg/dL (ref 0.50–1.10)
GFR, EST AFRICAN AMERICAN: 85 mL/min — AB (ref >90)
GFR, EST NON AFRICAN AMERICAN: 74 mL/min — AB (ref >90)
GLUCOSE: 105 mg/dL — AB (ref 70–99)
POTASSIUM: 4.2 meq/L (ref 3.7–5.3)
SODIUM: 132 meq/L — AB (ref 137–147)
Total Bilirubin: 0.5 mg/dL (ref 0.3–1.2)
Total Protein: 8.1 g/dL (ref 6.0–8.3)

## 2013-11-14 LAB — LIPASE, BLOOD: Lipase: 76 U/L — ABNORMAL HIGH (ref 11–59)

## 2013-11-14 LAB — URINE MICROSCOPIC-ADD ON

## 2013-11-14 MED ORDER — GI COCKTAIL ~~LOC~~
30.0000 mL | Freq: Once | ORAL | Status: AC
  Administered 2013-11-14: 30 mL via ORAL
  Filled 2013-11-14: qty 30

## 2013-11-14 MED ORDER — ONDANSETRON HCL 4 MG/2ML IJ SOLN
4.0000 mg | Freq: Once | INTRAMUSCULAR | Status: AC
  Administered 2013-11-14: 4 mg via INTRAVENOUS
  Filled 2013-11-14: qty 2

## 2013-11-14 MED ORDER — HYDROMORPHONE HCL PF 1 MG/ML IJ SOLN
0.5000 mg | Freq: Once | INTRAMUSCULAR | Status: AC
  Administered 2013-11-14: 0.5 mg via INTRAVENOUS
  Filled 2013-11-14: qty 1

## 2013-11-14 MED ORDER — PANTOPRAZOLE SODIUM 40 MG IV SOLR
40.0000 mg | Freq: Once | INTRAVENOUS | Status: AC
  Administered 2013-11-14: 40 mg via INTRAVENOUS
  Filled 2013-11-14: qty 40

## 2013-11-14 MED ORDER — THIAMINE HCL 100 MG/ML IJ SOLN
100.0000 mg | Freq: Once | INTRAMUSCULAR | Status: AC
  Administered 2013-11-14: 100 mg via INTRAVENOUS
  Filled 2013-11-14: qty 2

## 2013-11-14 MED ORDER — OXYCODONE-ACETAMINOPHEN 5-325 MG PO TABS: 1.0000 | ORAL_TABLET | Freq: Four times a day (QID) | ORAL | Status: DC | PRN

## 2013-11-14 MED ORDER — HYDROMORPHONE HCL PF 1 MG/ML IJ SOLN
1.0000 mg | Freq: Once | INTRAMUSCULAR | Status: AC
  Administered 2013-11-14: 1 mg via INTRAVENOUS
  Filled 2013-11-14: qty 1

## 2013-11-14 MED ORDER — SODIUM CHLORIDE 0.9 % IV BOLUS (SEPSIS)
1000.0000 mL | Freq: Once | INTRAVENOUS | Status: AC
  Administered 2013-11-14: 1000 mL via INTRAVENOUS

## 2013-11-14 MED ORDER — FAMOTIDINE 20 MG PO TABS
20.0000 mg | ORAL_TABLET | Freq: Once | ORAL | Status: AC
  Administered 2013-11-14: 20 mg via ORAL
  Filled 2013-11-14: qty 1

## 2013-11-14 NOTE — Discharge Instructions (Signed)
Abdominal Pain, Women °Abdominal (stomach, pelvic, or belly) pain can be caused by many things. It is important to tell your doctor: °· The location of the pain. °· Does it come and go or is it present all the time? °· Are there things that start the pain (eating certain foods, exercise)? °· Are there other symptoms associated with the pain (fever, nausea, vomiting, diarrhea)? °All of this is helpful to know when trying to find the cause of the pain. °CAUSES  °· Stomach: virus or bacteria infection, or ulcer. °· Intestine: appendicitis (inflamed appendix), regional ileitis (Crohn's disease), ulcerative colitis (inflamed colon), irritable bowel syndrome, diverticulitis (inflamed diverticulum of the colon), or cancer of the stomach or intestine. °· Gallbladder disease or stones in the gallbladder. °· Kidney disease, kidney stones, or infection. °· Pancreas infection or cancer. °· Fibromyalgia (pain disorder). °· Diseases of the female organs: °¨ Uterus: fibroid (non-cancerous) tumors or infection. °¨ Fallopian tubes: infection or tubal pregnancy. °¨ Ovary: cysts or tumors. °¨ Pelvic adhesions (scar tissue). °¨ Endometriosis (uterus lining tissue growing in the pelvis and on the pelvic organs). °¨ Pelvic congestion syndrome (female organs filling up with blood just before the menstrual period). °¨ Pain with the menstrual period. °¨ Pain with ovulation (producing an egg). °¨ Pain with an IUD (intrauterine device, birth control) in the uterus. °¨ Cancer of the female organs. °· Functional pain (pain not caused by a disease, may improve without treatment). °· Psychological pain. °· Depression. °DIAGNOSIS  °Your doctor will decide the seriousness of your pain by doing an examination. °· Blood tests. °· X-rays. °· Ultrasound. °· CT scan (computed tomography, special type of X-ray). °· MRI (magnetic resonance imaging). °· Cultures, for infection. °· Barium enema (dye inserted in the large intestine, to better view it with  X-rays). °· Colonoscopy (looking in intestine with a lighted tube). °· Laparoscopy (minor surgery, looking in abdomen with a lighted tube). °· Major abdominal exploratory surgery (looking in abdomen with a large incision). °TREATMENT  °The treatment will depend on the cause of the pain.  °· Many cases can be observed and treated at home. °· Over-the-counter medicines recommended by your caregiver. °· Prescription medicine. °· Antibiotics, for infection. °· Birth control pills, for painful periods or for ovulation pain. °· Hormone treatment, for endometriosis. °· Nerve blocking injections. °· Physical therapy. °· Antidepressants. °· Counseling with a psychologist or psychiatrist. °· Minor or major surgery. °HOME CARE INSTRUCTIONS  °· Do not take laxatives, unless directed by your caregiver. °· Take over-the-counter pain medicine only if ordered by your caregiver. Do not take aspirin because it can cause an upset stomach or bleeding. °· Try a clear liquid diet (broth or water) as ordered by your caregiver. Slowly move to a bland diet, as tolerated, if the pain is related to the stomach or intestine. °· Have a thermometer and take your temperature several times a day, and record it. °· Bed rest and sleep, if it helps the pain. °· Avoid sexual intercourse, if it causes pain. °· Avoid stressful situations. °· Keep your follow-up appointments and tests, as your caregiver orders. °· If the pain does not go away with medicine or surgery, you may try: °¨ Acupuncture. °¨ Relaxation exercises (yoga, meditation). °¨ Group therapy. °¨ Counseling. °SEEK MEDICAL CARE IF:  °· You notice certain foods cause stomach pain. °· Your home care treatment is not helping your pain. °· You need stronger pain medicine. °· You want your IUD removed. °· You feel faint or   lightheaded. °· You develop nausea and vomiting. °· You develop a rash. °· You are having side effects or an allergy to your medicine. °SEEK IMMEDIATE MEDICAL CARE IF:  °· Your  pain does not go away or gets worse. °· You have a fever. °· Your pain is felt only in portions of the abdomen. The right side could possibly be appendicitis. The left lower portion of the abdomen could be colitis or diverticulitis. °· You are passing blood in your stools (bright red or black tarry stools, with or without vomiting). °· You have blood in your urine. °· You develop chills, with or without a fever. °· You pass out. °MAKE SURE YOU:  °· Understand these instructions. °· Will watch your condition. °· Will get help right away if you are not doing well or get worse. °Document Released: 02/21/2007 Document Revised: 07/19/2011 Document Reviewed: 03/13/2009 °[employer]® Patient Information ©2015 [employer]. This information is not intended to replace advice given to you by your health care provider. Make sure you discuss any questions you have with your health care provider. ° °

## 2013-11-14 NOTE — ED Provider Notes (Signed)
Medical screening examination/treatment/procedure(s) were conducted as a shared visit with non-physician practitioner(s) and myself.  I personally evaluated the patient during the encounter.   Pt with hx recurrent abd pain, gastritis, c/o epigastric pain. Ivf, pain rx. Labs. On recheck pt comfortable. abd soft nt. Pt states has gi follow up.    Mirna Mires, MD 11/14/13 1452

## 2013-11-14 NOTE — ED Notes (Signed)
Pt given water for PO challenge 

## 2013-11-14 NOTE — ED Provider Notes (Signed)
CSN: [id number]     Arrival date & time 11/14/13  1035 History   First MD Initiated Contact with Patient 11/14/13 1157     Chief Complaint  Patient presents with  . Emesis  . Diarrhea     (Consider location/radiation/quality/duration/timing/severity/associated sxs/prior Treatment) HPI Pt is a 53yo female with hx of depression, fibromyalgia, hearing impairment, gastritis, anxiety, GERD, HTN, and pancreatitis c/o epigastric pain associated with n/v/d that started 2 days ago.  Pt states pain is sharp, "nawing" sensation, 8/10.  Pt states she had 1 episode of non-bloody, non-bilious emesis and 2 episodes of watery diarrhea this morning without blood or mucous.  Pt states she did have similar symptoms about 1 year ago and was diagnosed with pancreatitis at that time due to alcohol consumption, however, states she has not been drinking alcohol recently.  Pt states she is followed by [facility] and just received results from a stomach biopsy that stated there was no infection or cancer.  Pt was given phenergan and protonix.  Pt states nausea medication does help some. Denies fever, urinary or vaginal symptoms. Abdominal surgical hx significant for cholecystectomy and appendectomy.  No sick contacts or recent travel.  Pt has been seen in ED 5 other times for similar abdominal related complaints within last 6 months.    Past Medical History  Diagnosis Date  . Depression   . Migraine   . Fibromyalgia   . Hearing impairment   . Arthritis   . Cardiomyopathy   . Pulmonary embolism   . Gastritis   . Osteoporosis   . Anemia   . Allergy     SEASONAL  . Anxiety   . GERD (gastroesophageal reflux disease)   . Hypertension     [name], take htn medication to regulate heart beat.  . Sleep apnea    Past Surgical History  Procedure Laterality Date  . Appendectomy    . Inner ear surgery      TUBES  . Tonsillectomy and adenoidectomy    . Tubal ligation    . Laparoscopic cholecystectomy  2012  . Cardiac  surgery  January 2007    mitral valve repair  . Cardiac surgery  1973    atrial septal defect   Family History  Problem Relation Age of Onset  . Breast cancer Mother     bilateral; ages 62 and 75; TAH/BSO ~50  . Depression Sister   . Heart disease Father   . Hypertension Father    History  Substance Use Topics  . Smoking status: Never Smoker   . Smokeless tobacco: Not on file  . Alcohol Use: No   OB History   Grav Para Term Preterm Abortions TAB SAB Ect Mult Living   2 2 2       2      Review of Systems  Constitutional: Negative for fever and chills.  Respiratory: Negative for cough and shortness of breath.   Cardiovascular: Negative for chest pain and palpitations.  Gastrointestinal: Positive for nausea, vomiting, abdominal pain and diarrhea. Negative for constipation and blood in stool.  Genitourinary: Negative for urgency, flank pain, vaginal bleeding, vaginal discharge, vaginal pain and menstrual problem.  Musculoskeletal: Negative for back pain and myalgias.  All other systems reviewed and are negative.     Allergies  Depakote and Valium  Home Medications   Prior to Admission medications   Medication Sig Start Date End Date Taking? Authorizing Provider  aspirin EC 81 MG tablet Take 81 mg by mouth  every morning.    Historical Provider, MD  BuPROPion HCl ER, XL, (FORFIVO XL) 450 MG TB24 Take 450 mg by mouth every morning. 08/22/13   Norma Fredrickson, MD  butalbital-acetaminophen-caffeine (FIORICET, ESGIC) [phone] MG per tablet Take 1 tablet by mouth 2 (two) times daily as needed for headache.    Historical Provider, MD  Calcium Carbonate-Vitamin D (CALCIUM 600 + D PO) Take 1 tablet by mouth 2 (two) times daily.    Historical Provider, MD  carvedilol (COREG) 25 MG tablet Take 1 tablet (25 mg total) by mouth 2 (two) times daily with a meal. 07/23/13   Candee Furbish, MD  desvenlafaxine (PRISTIQ) 100 MG 24 hr tablet Take 1 tablet (100 mg total) by mouth daily. 08/22/13    Norma Fredrickson, MD  Flaxseed, Linseed, (FLAXSEED OIL) 1000 MG CAPS Take 1,000 mg by mouth 2 (two) times daily.    Historical Provider, MD  fluconazole (DIFLUCAN) 100 MG tablet Take 1 tablet (100 mg total) by mouth daily. 10/29/13   Milus Banister, MD  hydrochlorothiazide (MICROZIDE) 12.5 MG capsule Take 12.5 mg by mouth every morning.     Historical Provider, MD  HYDROcodone-acetaminophen (NORCO/VICODIN) 5-325 MG per tablet Take 1 tablet by mouth every 6 (six) hours as needed for moderate pain.    Historical Provider, MD  loratadine (CLARITIN) 10 MG tablet Take 10 mg by mouth daily.    Historical Provider, MD  meloxicam (MOBIC) 15 MG tablet  11/12/13   Historical Provider, MD  mirabegron ER (MYRBETRIQ) 25 MG TB24 tablet Take 1 tablet (25 mg total) by mouth daily. 06/13/13   Terrance Mass, MD  Multiple Vitamin (MULTIVITAMIN) tablet Take 1 tablet by mouth daily.     Historical Provider, MD  ondansetron (ZOFRAN) 4 MG tablet Take 1 tablet (4 mg total) by mouth every 6 (six) hours. 11/03/13   Virgel Manifold, MD  oxyCODONE-acetaminophen (PERCOCET/ROXICET) 5-325 MG per tablet Take 1-2 tablets by mouth every 4 (four) hours as needed for severe pain. 11/03/13   Virgel Manifold, MD  oxyCODONE-acetaminophen (PERCOCET/ROXICET) 5-325 MG per tablet Take 1-2 tablets by mouth every 6 (six) hours as needed for moderate pain or severe pain. 11/14/13   Noland Fordyce, PA-C  pantoprazole (PROTONIX) 40 MG tablet Take 1 tablet (40 mg total) by mouth daily. 10/29/13   Milus Banister, MD  prochlorperazine (COMPAZINE) 10 MG tablet  10/26/13   Historical Provider, MD  promethazine (PHENERGAN) 25 MG tablet Take 1 tablet (25 mg total) by mouth every 6 (six) hours as needed for nausea or vomiting. 10/26/13   Fransico Meadow, PA-C   BP 145/80  Pulse 89  Temp(Src) 98.1 F (36.7 C) (Oral)  Resp 18  Ht 5\' 5"  (1.651 m)  Wt 150 lb (68.04 kg)  BMI 24.96 kg/m2  SpO2 94% Physical Exam  Nursing note and vitals reviewed. Constitutional: She  appears well-developed and well-nourished. No distress.  Pt lying in exam bed, NAD  HENT:  Head: Normocephalic and atraumatic.  Eyes: Conjunctivae are normal. No scleral icterus.  Neck: Normal range of motion.  Cardiovascular: Normal rate, regular rhythm and normal heart sounds.   Pulmonary/Chest: Effort normal and breath sounds normal. No respiratory distress. She has no wheezes. She has no rales. She exhibits no tenderness.  Abdominal: Soft. Bowel sounds are normal. She exhibits no distension and no mass. There is tenderness. There is no rebound and no guarding.  Soft, non-distended. Tenderness in epigastrium w/o rebound or guarding. No CVAT  Musculoskeletal: Normal range  of motion.  Neurological: She is alert.  Skin: Skin is warm and dry. She is not diaphoretic.    ED Course  Procedures (including critical care time) Labs Review Labs Reviewed  CBC WITH DIFFERENTIAL - Abnormal; Notable for the following:    Hemoglobin 16.1 (*)    HCT 46.3 (*)    All other components within normal limits  COMPREHENSIVE METABOLIC PANEL - Abnormal; Notable for the following:    Sodium 132 (*)    Chloride 92 (*)    Glucose, Bld 105 (*)    AST 43 (*)    GFR calc non Af Amer 74 (*)    GFR calc Af Amer 85 (*)    Anion gap 19 (*)    All other components within normal limits  LIPASE, BLOOD - Abnormal; Notable for the following:    Lipase 76 (*)    All other components within normal limits  URINALYSIS, ROUTINE W REFLEX MICROSCOPIC - Abnormal; Notable for the following:    APPearance CLOUDY (*)    Hgb urine dipstick SMALL (*)    Leukocytes, UA SMALL (*)    All other components within normal limits  URINE MICROSCOPIC-ADD ON - Abnormal; Notable for the following:    Bacteria, UA FEW (*)    All other components within normal limits    Imaging Review No results found.   EKG Interpretation None      MDM   Final diagnoses:  Epigastric pain  Nausea vomiting and diarrhea    Pt is a 53yo  female with chronic abdominal pain presenting to ED c/o worsening epigastric pain associated with n/v/d x 2 days. Pt is followed by [facility] who performed a stomach biopsy which was unremarkable.  Pt does have hx of pancreatitis.  On exam, pt appears well, non-toxic. NAD. Abd-soft, non-distended. Tenderness in epigastrium.  No rebound or guarding, no CVAT. Not concerned for surgical abdomen.  Labs: significant for mild hyponatremia: 132, and mildly elevated Lipase: 76 but up from previous lipase of 46 one week ago.  UA: unremarkable.   Signs and symptoms consistent with pancreatitis. Will tx symptomatically. IV fluids, zofran, and dilaudid given in ED. Will tx fluid challenge. If symptoms manageable, will discharge pt home with pain medication and advised to f/u with her GI specialist.   Pain did improve some with IV dilaudid. Pt requested more dilaudid soon after given initial dose. Pt was given GI cocktail, pepcid, and protonix.  Pt able to keep down several sounds of water.  Will discharge pt home as not evidence of emergent process taking place.  Advised to f/u with GI specialist.  Return precautions provided. Pt verbalized understanding and agreement with tx plan.     Noland Fordyce, PA-C 11/14/13 1401

## 2013-11-14 NOTE — ED Notes (Signed)
Per pt sts here for chronic abdominal pain and N,V,D.

## 2013-11-21 ENCOUNTER — Encounter (HOSPITAL_COMMUNITY): Payer: Self-pay | Admitting: Emergency Medicine

## 2013-11-21 ENCOUNTER — Emergency Department (HOSPITAL_COMMUNITY)
Admission: EM | Admit: 2013-11-21 | Discharge: 2013-11-21 | Disposition: A | Discharge status: Home / Self Care | Payer: Medicare Other | Attending: Emergency Medicine | Admitting: Emergency Medicine

## 2013-11-21 DIAGNOSIS — F411 Generalized anxiety disorder: Secondary | ICD-10-CM | POA: Insufficient documentation

## 2013-11-21 DIAGNOSIS — Z791 Long term (current) use of non-steroidal anti-inflammatories (NSAID): Secondary | ICD-10-CM | POA: Insufficient documentation

## 2013-11-21 DIAGNOSIS — K219 Gastro-esophageal reflux disease without esophagitis: Secondary | ICD-10-CM | POA: Insufficient documentation

## 2013-11-21 DIAGNOSIS — F329 Major depressive disorder, single episode, unspecified: Secondary | ICD-10-CM | POA: Diagnosis not present

## 2013-11-21 DIAGNOSIS — Z7982 Long term (current) use of aspirin: Secondary | ICD-10-CM | POA: Insufficient documentation

## 2013-11-21 DIAGNOSIS — Z86711 Personal history of pulmonary embolism: Secondary | ICD-10-CM | POA: Diagnosis not present

## 2013-11-21 DIAGNOSIS — F3289 Other specified depressive episodes: Secondary | ICD-10-CM | POA: Insufficient documentation

## 2013-11-21 DIAGNOSIS — K2970 Gastritis, unspecified, without bleeding: Secondary | ICD-10-CM | POA: Diagnosis not present

## 2013-11-21 DIAGNOSIS — I10 Essential (primary) hypertension: Secondary | ICD-10-CM | POA: Diagnosis not present

## 2013-11-21 DIAGNOSIS — R1084 Generalized abdominal pain: Secondary | ICD-10-CM | POA: Insufficient documentation

## 2013-11-21 DIAGNOSIS — R197 Diarrhea, unspecified: Secondary | ICD-10-CM | POA: Insufficient documentation

## 2013-11-21 DIAGNOSIS — Z862 Personal history of diseases of the blood and blood-forming organs and certain disorders involving the immune mechanism: Secondary | ICD-10-CM | POA: Insufficient documentation

## 2013-11-21 DIAGNOSIS — Z9889 Other specified postprocedural states: Secondary | ICD-10-CM | POA: Diagnosis not present

## 2013-11-21 DIAGNOSIS — K2990 Gastroduodenitis, unspecified, without bleeding: Secondary | ICD-10-CM

## 2013-11-21 DIAGNOSIS — Z8709 Personal history of other diseases of the respiratory system: Secondary | ICD-10-CM | POA: Diagnosis not present

## 2013-11-21 DIAGNOSIS — Z9089 Acquired absence of other organs: Secondary | ICD-10-CM | POA: Insufficient documentation

## 2013-11-21 DIAGNOSIS — Z9851 Tubal ligation status: Secondary | ICD-10-CM | POA: Insufficient documentation

## 2013-11-21 DIAGNOSIS — M810 Age-related osteoporosis without current pathological fracture: Secondary | ICD-10-CM | POA: Insufficient documentation

## 2013-11-21 DIAGNOSIS — R112 Nausea with vomiting, unspecified: Secondary | ICD-10-CM

## 2013-11-21 DIAGNOSIS — G43909 Migraine, unspecified, not intractable, without status migrainosus: Secondary | ICD-10-CM | POA: Insufficient documentation

## 2013-11-21 DIAGNOSIS — Z79899 Other long term (current) drug therapy: Secondary | ICD-10-CM | POA: Insufficient documentation

## 2013-11-21 DIAGNOSIS — M129 Arthropathy, unspecified: Secondary | ICD-10-CM | POA: Insufficient documentation

## 2013-11-21 LAB — COMPREHENSIVE METABOLIC PANEL
ALT: 17 U/L (ref 0–35)
AST: 32 U/L (ref 0–37)
Albumin: 3.7 g/dL (ref 3.5–5.2)
Alkaline Phosphatase: 51 U/L (ref 39–117)
Anion gap: 19 — ABNORMAL HIGH (ref 5–15)
BUN: 30 mg/dL — ABNORMAL HIGH (ref 6–23)
CALCIUM: 8.9 mg/dL (ref 8.4–10.5)
CO2: 20 mEq/L (ref 19–32)
Chloride: 97 mEq/L (ref 96–112)
Creatinine, Ser: 0.93 mg/dL (ref 0.50–1.10)
GFR calc Af Amer: 80 mL/min — ABNORMAL LOW (ref >90)
GFR calc non Af Amer: 69 mL/min — ABNORMAL LOW (ref >90)
Glucose, Bld: 105 mg/dL — ABNORMAL HIGH (ref 70–99)
Potassium: 4.4 mEq/L (ref 3.7–5.3)
SODIUM: 136 meq/L — AB (ref 137–147)
TOTAL PROTEIN: 6.8 g/dL (ref 6.0–8.3)
Total Bilirubin: 0.6 mg/dL (ref 0.3–1.2)

## 2013-11-21 LAB — CBC WITH DIFFERENTIAL/PLATELET
BASOS ABS: 0 10*3/uL (ref 0.0–0.1)
BASOS PCT: 1 % (ref 0–1)
EOS ABS: 0.1 10*3/uL (ref 0.0–0.7)
Eosinophils Relative: 2 % (ref 0–5)
HCT: 43 % (ref 36.0–46.0)
HEMOGLOBIN: 14.8 g/dL (ref 12.0–15.0)
Lymphocytes Relative: 16 % (ref 12–46)
Lymphs Abs: 0.7 10*3/uL (ref 0.7–4.0)
MCH: 32.3 pg (ref 26.0–34.0)
MCHC: 34.4 g/dL (ref 30.0–36.0)
MCV: 93.9 fL (ref 78.0–100.0)
MONOS PCT: 10 % (ref 3–12)
Monocytes Absolute: 0.4 10*3/uL (ref 0.1–1.0)
NEUTROS ABS: 3.1 10*3/uL (ref 1.7–7.7)
NEUTROS PCT: 71 % (ref 43–77)
PLATELETS: 223 10*3/uL (ref 150–400)
RBC: 4.58 MIL/uL (ref 3.87–5.11)
RDW: 14.1 % (ref 11.5–15.5)
WBC: 4.4 10*3/uL (ref 4.0–10.5)

## 2013-11-21 LAB — I-STAT CG4 LACTIC ACID, ED: Lactic Acid, Venous: 1.43 mmol/L (ref 0.5–2.2)

## 2013-11-21 LAB — URINE MICROSCOPIC-ADD ON

## 2013-11-21 LAB — URINALYSIS, ROUTINE W REFLEX MICROSCOPIC
GLUCOSE, UA: 100 mg/dL — AB
Ketones, ur: 15 mg/dL — AB
LEUKOCYTES UA: NEGATIVE
Nitrite: NEGATIVE
PH: 5.5 (ref 5.0–8.0)
Protein, ur: 100 mg/dL — AB
Specific Gravity, Urine: >1.03 — ABNORMAL HIGH (ref 1.005–1.030)
Urobilinogen, UA: 0.2 mg/dL (ref 0.0–1.0)

## 2013-11-21 LAB — LIPASE, BLOOD: Lipase: 85 U/L — ABNORMAL HIGH (ref 11–59)

## 2013-11-21 MED ORDER — FAMOTIDINE IN NACL 20-0.9 MG/50ML-% IV SOLN
20.0000 mg | Freq: Once | INTRAVENOUS | Status: AC
  Administered 2013-11-21: 20 mg via INTRAVENOUS
  Filled 2013-11-21: qty 50

## 2013-11-21 MED ORDER — MORPHINE SULFATE 4 MG/ML IJ SOLN
4.0000 mg | Freq: Once | INTRAMUSCULAR | Status: AC
  Administered 2013-11-21: 4 mg via INTRAVENOUS
  Filled 2013-11-21: qty 1

## 2013-11-21 MED ORDER — PROMETHAZINE HCL 25 MG PO TABS
25.0000 mg | ORAL_TABLET | ORAL | Status: AC
  Administered 2013-11-21: 25 mg via ORAL
  Filled 2013-11-21: qty 1

## 2013-11-21 MED ORDER — HYDROCODONE-ACETAMINOPHEN 5-325 MG PO TABS: 1.0000 | ORAL_TABLET | Freq: Four times a day (QID) | ORAL | Status: DC | PRN

## 2013-11-21 MED ORDER — PROMETHAZINE HCL 25 MG PO TABS: 25.0000 mg | ORAL_TABLET | Freq: Four times a day (QID) | ORAL | Status: DC | PRN

## 2013-11-21 MED ORDER — ONDANSETRON HCL 4 MG/2ML IJ SOLN
4.0000 mg | Freq: Once | INTRAMUSCULAR | Status: AC
  Administered 2013-11-21: 4 mg via INTRAVENOUS
  Filled 2013-11-21: qty 2

## 2013-11-21 MED ORDER — OXYCODONE-ACETAMINOPHEN 5-325 MG PO TABS
2.0000 | ORAL_TABLET | Freq: Once | ORAL | Status: AC
Start: 2013-11-21 — End: 2013-11-21
  Administered 2013-11-21: 2 via ORAL
  Filled 2013-11-21: qty 2

## 2013-11-21 MED ORDER — SODIUM CHLORIDE 0.9 % IV BOLUS (SEPSIS)
1000.0000 mL | Freq: Once | INTRAVENOUS | Status: AC
  Administered 2013-11-21: 1000 mL via INTRAVENOUS

## 2013-11-21 MED ORDER — GI COCKTAIL ~~LOC~~
30.0000 mL | Freq: Once | ORAL | Status: AC
  Administered 2013-11-21: 30 mL via ORAL
  Filled 2013-11-21: qty 30

## 2013-11-21 NOTE — ED Notes (Signed)
Ginger Ale given per [employer]

## 2013-11-21 NOTE — ED Provider Notes (Signed)
Medical screening examination/treatment/procedure(s) were performed by non-physician practitioner and as supervising physician I was immediately available for consultation/collaboration.   EKG Interpretation None        Fredia Sorrow, MD 11/21/13 [phone]

## 2013-11-21 NOTE — ED Notes (Addendum)
Pt reports nausea at this time; informed pt of need of urine sample. Pt reports she will try in about 10 mins

## 2013-11-21 NOTE — ED Notes (Signed)
Pt ambulated to bathroom with steady gait; unable to obtain urine sample

## 2013-11-21 NOTE — ED Provider Notes (Signed)
CSN: [id number]     Arrival date & time 11/21/13  6644 History   First MD Initiated Contact with Patient 11/21/13 [id number]     Chief Complaint  Patient presents with  . Abdominal Pain    (Consider location/radiation/quality/duration/timing/severity/associated sxs/prior Treatment) HPI Comments: Patient is a 53 year old female with a history of pulmonary embolism, gastritis with esophageal reflux, and anemia who presents to the emergency department for abdominal pain. Patient states that she awoke at 3 AM today with vomiting and diarrhea. Patient has had approximately 2 episode of vomiting since symptom onset as well as 3 episodes of watery, nonbloody diarrhea. Patient states that she has an aching pain in her epigastric region as well as generalized abdominal cramping associated with her symptoms. She has not taken anything for symptoms prior to arrival and denies any modifying factors. Patient states that she experienced some lightheadedness at symptom onset as well as feeling as though her heart was "beating out of her chest". Patient denies associated fever, syncope, shortness of breath, hematemesis, melena, hematochezia, urinary symptoms, numbness/tingling, and extremity weakness.   Patient has an abdominal surgical history significant for appendectomy and cholecystectomy. Patient also had a colonoscopy and endoscopy performed on 10/29/2013 by [facility]. Findings consistent with mild esophagitis as well as distal, nonspecific gastritis. Patient was placed on Protonix which she states she has been taking daily. She also completed a course of Diflucan for suspected candidal esophagitis.  Patient is a 53 y.o. female presenting with abdominal pain. The history is provided by the patient. No language interpreter was used.  Abdominal Pain Associated symptoms: diarrhea and vomiting   Associated symptoms: no dysuria, no fever, no hematuria and no shortness of breath     Past Medical History  Diagnosis  Date  . Depression   . Migraine   . Fibromyalgia   . Hearing impairment   . Arthritis   . Cardiomyopathy   . Pulmonary embolism   . Gastritis   . Osteoporosis   . Anemia   . Allergy     SEASONAL  . Anxiety   . GERD (gastroesophageal reflux disease)   . Hypertension     [name], take htn medication to regulate heart beat.  . Sleep apnea    Past Surgical History  Procedure Laterality Date  . Appendectomy    . Inner ear surgery      TUBES  . Tonsillectomy and adenoidectomy    . Tubal ligation    . Laparoscopic cholecystectomy  2012  . Cardiac surgery  January 2007    mitral valve repair  . Cardiac surgery  1973    atrial septal defect   Family History  Problem Relation Age of Onset  . Breast cancer Mother     bilateral; ages 12 and 79; TAH/BSO ~50  . Depression Sister   . Heart disease Father   . Hypertension Father    History  Substance Use Topics  . Smoking status: Never Smoker   . Smokeless tobacco: Not on file  . Alcohol Use: No   OB History   Grav Para Term Preterm Abortions TAB SAB Ect Mult Living   2 2 2       2      Review of Systems  Constitutional: Negative for fever.  Respiratory: Negative for shortness of breath.   Gastrointestinal: Positive for vomiting, abdominal pain and diarrhea. Negative for blood in stool.  Genitourinary: Negative for dysuria and hematuria.  Neurological: Negative for weakness and numbness.  All other systems  reviewed and are negative.     Allergies  Depakote and Valium  Home Medications   Prior to Admission medications   Medication Sig Start Date End Date Taking? Authorizing Provider  ARIPiprazole (ABILIFY) 5 MG tablet Take 5 mg by mouth daily.   Yes Historical Provider, MD  aspirin EC 81 MG tablet Take 81 mg by mouth every morning.   Yes Historical Provider, MD  BuPROPion HCl ER, XL, (FORFIVO XL) 450 MG TB24 Take 450 mg by mouth every morning. 08/22/13  Yes Norma Fredrickson, MD  butalbital-acetaminophen-caffeine  (FIORICET, ESGIC) [phone] MG per tablet Take 1 tablet by mouth 2 (two) times daily as needed for headache.   Yes Historical Provider, MD  Calcium Carbonate-Vitamin D (CALCIUM 600 + D PO) Take 1 tablet by mouth 2 (two) times daily.   Yes Historical Provider, MD  carvedilol (COREG) 25 MG tablet Take 1 tablet (25 mg total) by mouth 2 (two) times daily with a meal. 07/23/13  Yes Candee Furbish, MD  desvenlafaxine (PRISTIQ) 100 MG 24 hr tablet Take 1 tablet (100 mg total) by mouth daily. 08/22/13  Yes Norma Fredrickson, MD  Flaxseed, Linseed, (FLAXSEED OIL) 1000 MG CAPS Take 1,000 mg by mouth 2 (two) times daily.   Yes Historical Provider, MD  hydrochlorothiazide (MICROZIDE) 12.5 MG capsule Take 12.5 mg by mouth every morning.    Yes Historical Provider, MD  HYDROcodone-acetaminophen (NORCO/VICODIN) 5-325 MG per tablet Take 1 tablet by mouth every 6 (six) hours as needed for moderate pain.   Yes Historical Provider, MD  loratadine (CLARITIN) 10 MG tablet Take 10 mg by mouth daily.   Yes Historical Provider, MD  meloxicam (MOBIC) 15 MG tablet Take 15 mg by mouth daily.  11/12/13  Yes Historical Provider, MD  mirabegron ER (MYRBETRIQ) 25 MG TB24 tablet Take 1 tablet (25 mg total) by mouth daily. 06/13/13  Yes Terrance Mass, MD  Multiple Vitamin (MULTIVITAMIN) tablet Take 1 tablet by mouth daily.    Yes Historical Provider, MD  ondansetron (ZOFRAN) 4 MG tablet Take 4 mg by mouth every 6 (six) hours as needed for nausea or vomiting.   Yes Historical Provider, MD  oxyCODONE-acetaminophen (PERCOCET/ROXICET) 5-325 MG per tablet Take 1-2 tablets by mouth every 6 (six) hours as needed for moderate pain or severe pain. 11/14/13  Yes Noland Fordyce, PA-C  pantoprazole (PROTONIX) 40 MG tablet Take 1 tablet (40 mg total) by mouth daily. 10/29/13  Yes Milus Banister, MD  prochlorperazine (COMPAZINE) 10 MG tablet Take 10 mg by mouth every 6 (six) hours as needed for nausea or vomiting.  10/26/13  Yes Historical Provider, MD   promethazine (PHENERGAN) 25 MG tablet Take 1 tablet (25 mg total) by mouth every 6 (six) hours as needed for nausea or vomiting. 10/26/13  Yes Hollace Kinnier Sofia, PA-C   BP 115/75  Pulse 77  Temp(Src) 97.6 F (36.4 C) (Oral)  Resp 21  Ht 5\' 5"  (1.651 m)  Wt 150 lb (68.04 kg)  BMI 24.96 kg/m2  SpO2 99%  Physical Exam  Nursing note and vitals reviewed. Constitutional: She is oriented to person, place, and time. She appears well-developed and well-nourished. No distress.  Nontoxic/nonseptic appearing  HENT:  Head: Normocephalic and atraumatic.  Mouth/Throat: Oropharynx is clear and moist. No oropharyngeal exudate.  Eyes: Conjunctivae and EOM are normal. Pupils are equal, round, and reactive to light. No scleral icterus.  Neck: Normal range of motion.  Cardiovascular: Normal rate, regular rhythm and intact distal pulses.  Pulmonary/Chest: Effort normal and breath sounds normal. No respiratory distress. She has no wheezes. She has no rales.  Chest expansion symmetric  Abdominal: Soft. She exhibits no distension. There is tenderness. There is no rebound and no guarding.  Soft abdomen with diffuse TTP, mildly more so in epigastric region. No peritoneal signs or guarding.  Musculoskeletal: Normal range of motion.  Neurological: She is alert and oriented to person, place, and time. She exhibits normal muscle tone. Coordination normal.  GCS 15. Patient moves extremities without ataxia.  Skin: Skin is warm and dry. No rash noted. She is not diaphoretic. No erythema. No pallor.  Psychiatric: She has a normal mood and affect. Her behavior is normal.    ED Course  Procedures (including critical care time) Labs Review Labs Reviewed  COMPREHENSIVE METABOLIC PANEL - Abnormal; Notable for the following:    Sodium 136 (*)    Glucose, Bld 105 (*)    BUN 30 (*)    GFR calc non Af Amer 69 (*)    GFR calc Af Amer 80 (*)    Anion gap 19 (*)    All other components within normal limits  LIPASE,  BLOOD - Abnormal; Notable for the following:    Lipase 85 (*)    All other components within normal limits  URINALYSIS, ROUTINE W REFLEX MICROSCOPIC - Abnormal; Notable for the following:    Specific Gravity, Urine >1.030 (*)    Glucose, UA 100 (*)    Hgb urine dipstick TRACE (*)    Bilirubin Urine MODERATE (*)    Ketones, ur 15 (*)    Protein, ur 100 (*)    All other components within normal limits  URINE MICROSCOPIC-ADD ON - Abnormal; Notable for the following:    Squamous Epithelial / LPF FEW (*)    Bacteria, UA FEW (*)    Casts HYALINE CASTS (*)    All other components within normal limits  CBC WITH DIFFERENTIAL  I-STAT CG4 LACTIC ACID, ED    Imaging Review No results found.   EKG Interpretation None      MDM   Final diagnoses:  Nausea, vomiting and diarrhea  Generalized abdominal pain    0700 - Patient presents for V/D and generalized abdominal pain. Hx of same for which she has been seen in the ED x 4. Also with hx of unremarkable endoscopy and colonoscopy on 10/29/13. Abdominal exam with generalized TTP. No peritoneal signs, masses, or guarding. Will evaluate with labs. IVF, morphine, and zofran ordered.  0900 - Patient states pain down to 5/10 with 4mg  morphine x 2. States, "My last dose of pain medicine was at 0830. Do you think I could get something else like Dilaudid?" Have discussed management with other medications and that I will not be redosing pain medication given how recently last dose was administered. UA pending. Abdominal reexamination still with mild diffuse TTP, but improved.  1130 - Labs reviewed. Lipase noted to be elevated, but fairly c/w prior 1 week ago and stable from 1 year ago. No leukocytosis, anemia, or electrolyte imbalance. Cr function at baseline. UA does not suggest infection. Abdominal reexaminations remain stable. Patient states pain is 4/10. Do not believe further work up is indicated at this time. Symptoms appropriate for outpatient  management with gastroenterology. Norco and phenergan given for symptom management. Return precautions provided and patient agreeable to plan with no unaddressed concerns.   Filed Vitals:   11/21/13 0838 11/21/13 1018 11/21/13 1103 11/21/13 1135  BP: 100/64 82/70  117/70 126/77  Pulse: 68 66 72 70  Temp:    97.7 F (36.5 C)  TempSrc:    Oral  Resp: 15 14 14 14   Height:      Weight:      SpO2: 95% 99% 99% 100%     Antonietta Breach, PA-C 11/21/13 1208

## 2013-11-21 NOTE — ED Notes (Signed)
On antibiotics recently for yeast infection

## 2013-11-21 NOTE — ED Notes (Signed)
Kelly, PA at bedside. 

## 2013-11-21 NOTE — ED Notes (Signed)
Pt states she awoke this AM with abd pain on her left lower side and vomiting

## 2013-11-21 NOTE — Discharge Instructions (Signed)
Recommend a clear liquid diet until symptoms resolve. Recommend you continue taking your protonix. Take Norco as needed for pain control. Take Phenergan as prescribed for nausea as needed. Recommend followup with a gastroenterologist for further evaluation of her symptoms as well as primary care followup to ensure symptoms resolve.  Abdominal Pain Many things can cause belly (abdominal) pain. Most times, the belly pain is not dangerous. Many cases of belly pain can be watched and treated at home. HOME CARE   Do not take medicines that help you go poop (laxatives) unless told to by your doctor.  Only take medicine as told by your doctor.  Eat or drink as told by your doctor. Your doctor will tell you if you should be on a special diet. GET HELP IF:  You do not know what is causing your belly pain.  You have belly pain while you are sick to your stomach (nauseous) or have runny poop (diarrhea).  You have pain while you pee or poop.  Your belly pain wakes you up at night.  You have belly pain that gets worse or better when you eat.  You have belly pain that gets worse when you eat fatty foods.  You have a fever. GET HELP RIGHT AWAY IF:   The pain does not go away within 2 hours.  You keep throwing up (vomiting).  The pain changes and is only in the right or left part of the belly.  You have bloody or tarry looking poop. MAKE SURE YOU:   Understand these instructions.  Will watch your condition.  Will get help right away if you are not doing well or get worse. Document Released: 10/13/2007 Document Revised: 05/01/2013 Document Reviewed: 01/03/2013 [facility] Patient Information 2015 [place], [place]. This information is not intended to replace advice given to you by your health care provider. Make sure you discuss any questions you have with your health care provider. Clear Liquid Diet A clear liquid diet is a short-term diet that is prescribed to provide the necessary fluid and  basic energy you need when you can have nothing else. The clear liquid diet consists of liquids or solids that will become liquid at room temperature. You should be able to see through the liquid. There are many reasons that you may be restricted to clear liquids, such as:  When you have a sudden-onset (acute) condition that occurs before or after surgery.  To help your body slowly get adjusted to food again after a long period when you were unable to have food.  Replacement of fluids when you have a diarrheal disease.  When you are going to have certain exams, such as a colonoscopy, in which instruments are inserted inside your body to look at parts of your digestive system. WHAT CAN I HAVE? A clear liquid diet does not provide all the nutrients you need. It is important to choose a variety of the following items to get as many nutrients as possible:  Vegetable juices that do not have pulp.  Fruit juices and fruit drinks that do not have pulp.  Coffee (regular or decaffeinated), tea, or soda at the discretion of your health care provider.  Clear bouillon, broth, or strained broth-based soups.  High-protein and flavored gelatins.  Sugar or honey.  Ices or frozen ice pops that do not contain milk. If you are not sure whether you can have certain items, you should ask your health care provider. You may also ask your health care provider if there  are any other clear liquid options. Document Released: 04/26/2005 Document Revised: 05/01/2013 Document Reviewed: 03/23/2013 [facility] Patient Information 2015 [place], [place]. This information is not intended to replace advice given to you by your health care provider. Make sure you discuss any questions you have with your health care provider.

## 2013-12-07 ENCOUNTER — Encounter (HOSPITAL_COMMUNITY): Payer: Self-pay | Admitting: Emergency Medicine

## 2013-12-07 ENCOUNTER — Emergency Department (HOSPITAL_COMMUNITY)
Admission: EM | Admit: 2013-12-07 | Discharge: 2013-12-07 | Disposition: A | Discharge status: Home / Self Care | Payer: Medicare Other | Attending: Emergency Medicine | Admitting: Emergency Medicine

## 2013-12-07 DIAGNOSIS — R109 Unspecified abdominal pain: Secondary | ICD-10-CM

## 2013-12-07 DIAGNOSIS — Z86711 Personal history of pulmonary embolism: Secondary | ICD-10-CM | POA: Diagnosis not present

## 2013-12-07 DIAGNOSIS — F329 Major depressive disorder, single episode, unspecified: Secondary | ICD-10-CM | POA: Diagnosis not present

## 2013-12-07 DIAGNOSIS — R000 Tachycardia, unspecified: Secondary | ICD-10-CM | POA: Insufficient documentation

## 2013-12-07 DIAGNOSIS — K2990 Gastroduodenitis, unspecified, without bleeding: Secondary | ICD-10-CM

## 2013-12-07 DIAGNOSIS — M129 Arthropathy, unspecified: Secondary | ICD-10-CM | POA: Diagnosis not present

## 2013-12-07 DIAGNOSIS — Z7982 Long term (current) use of aspirin: Secondary | ICD-10-CM | POA: Diagnosis not present

## 2013-12-07 DIAGNOSIS — IMO0001 Reserved for inherently not codable concepts without codable children: Secondary | ICD-10-CM | POA: Insufficient documentation

## 2013-12-07 DIAGNOSIS — Z79899 Other long term (current) drug therapy: Secondary | ICD-10-CM | POA: Diagnosis not present

## 2013-12-07 DIAGNOSIS — Z862 Personal history of diseases of the blood and blood-forming organs and certain disorders involving the immune mechanism: Secondary | ICD-10-CM | POA: Diagnosis not present

## 2013-12-07 DIAGNOSIS — M810 Age-related osteoporosis without current pathological fracture: Secondary | ICD-10-CM | POA: Insufficient documentation

## 2013-12-07 DIAGNOSIS — R112 Nausea with vomiting, unspecified: Secondary | ICD-10-CM | POA: Diagnosis not present

## 2013-12-07 DIAGNOSIS — Z8669 Personal history of other diseases of the nervous system and sense organs: Secondary | ICD-10-CM | POA: Insufficient documentation

## 2013-12-07 DIAGNOSIS — K2970 Gastritis, unspecified, without bleeding: Secondary | ICD-10-CM | POA: Diagnosis not present

## 2013-12-07 DIAGNOSIS — I10 Essential (primary) hypertension: Secondary | ICD-10-CM | POA: Insufficient documentation

## 2013-12-07 DIAGNOSIS — G8929 Other chronic pain: Secondary | ICD-10-CM

## 2013-12-07 DIAGNOSIS — K219 Gastro-esophageal reflux disease without esophagitis: Secondary | ICD-10-CM | POA: Diagnosis not present

## 2013-12-07 DIAGNOSIS — R1013 Epigastric pain: Secondary | ICD-10-CM | POA: Diagnosis not present

## 2013-12-07 DIAGNOSIS — F411 Generalized anxiety disorder: Secondary | ICD-10-CM | POA: Diagnosis not present

## 2013-12-07 DIAGNOSIS — R079 Chest pain, unspecified: Secondary | ICD-10-CM | POA: Insufficient documentation

## 2013-12-07 DIAGNOSIS — F3289 Other specified depressive episodes: Secondary | ICD-10-CM | POA: Insufficient documentation

## 2013-12-07 LAB — I-STAT TROPONIN, ED: Troponin i, poc: 0 ng/mL (ref 0.00–0.08)

## 2013-12-07 LAB — CBC
HCT: 41 % (ref 36.0–46.0)
HEMOGLOBIN: 14.3 g/dL (ref 12.0–15.0)
MCH: 32.2 pg (ref 26.0–34.0)
MCHC: 34.9 g/dL (ref 30.0–36.0)
MCV: 92.3 fL (ref 78.0–100.0)
Platelets: 261 10*3/uL (ref 150–400)
RBC: 4.44 MIL/uL (ref 3.87–5.11)
RDW: 14.4 % (ref 11.5–15.5)
WBC: 4.6 10*3/uL (ref 4.0–10.5)

## 2013-12-07 LAB — HEPATIC FUNCTION PANEL
ALBUMIN: 4.1 g/dL (ref 3.5–5.2)
ALT: 25 U/L (ref 0–35)
AST: 38 U/L — AB (ref 0–37)
Alkaline Phosphatase: 65 U/L (ref 39–117)
TOTAL PROTEIN: 7.3 g/dL (ref 6.0–8.3)
Total Bilirubin: 0.3 mg/dL (ref 0.3–1.2)

## 2013-12-07 LAB — BASIC METABOLIC PANEL
Anion gap: 20 — ABNORMAL HIGH (ref 5–15)
BUN: 18 mg/dL (ref 6–23)
CO2: 21 mEq/L (ref 19–32)
CREATININE: 0.86 mg/dL (ref 0.50–1.10)
Calcium: 9 mg/dL (ref 8.4–10.5)
Chloride: 100 mEq/L (ref 96–112)
GFR calc non Af Amer: 76 mL/min — ABNORMAL LOW (ref >90)
GFR, EST AFRICAN AMERICAN: 88 mL/min — AB (ref >90)
GLUCOSE: 80 mg/dL (ref 70–99)
Potassium: 3.7 mEq/L (ref 3.7–5.3)
Sodium: 141 mEq/L (ref 137–147)

## 2013-12-07 LAB — LIPASE, BLOOD: LIPASE: 32 U/L (ref 11–59)

## 2013-12-07 LAB — LACTIC ACID, PLASMA: LACTIC ACID, VENOUS: 2.2 mmol/L (ref 0.5–2.2)

## 2013-12-07 MED ORDER — ONDANSETRON 4 MG PO TBDP
4.0000 mg | ORAL_TABLET | Freq: Once | ORAL | Status: AC
  Administered 2013-12-07: 4 mg via ORAL
  Filled 2013-12-07: qty 1

## 2013-12-07 MED ORDER — HYDROMORPHONE HCL PF 1 MG/ML IJ SOLN
1.0000 mg | Freq: Once | INTRAMUSCULAR | Status: AC
  Administered 2013-12-07: 1 mg via INTRAVENOUS
  Filled 2013-12-07: qty 1

## 2013-12-07 MED ORDER — HYDROCODONE-ACETAMINOPHEN 5-325 MG PO TABS
2.0000 | ORAL_TABLET | Freq: Once | ORAL | Status: AC
  Administered 2013-12-07: 2 via ORAL
  Filled 2013-12-07: qty 2

## 2013-12-07 MED ORDER — SODIUM CHLORIDE 0.9 % IV BOLUS (SEPSIS)
1000.0000 mL | Freq: Once | INTRAVENOUS | Status: AC
  Administered 2013-12-07: 1000 mL via INTRAVENOUS

## 2013-12-07 MED ORDER — PROMETHAZINE HCL 25 MG RE SUPP: 25.0000 mg | Freq: Four times a day (QID) | RECTAL | Status: DC | PRN

## 2013-12-07 MED ORDER — PROMETHAZINE HCL 25 MG/ML IJ SOLN
25.0000 mg | Freq: Once | INTRAMUSCULAR | Status: AC
  Administered 2013-12-07: 25 mg via INTRAVENOUS
  Filled 2013-12-07: qty 1

## 2013-12-07 NOTE — ED Provider Notes (Signed)
CSN: [id number]     Arrival date & time 12/07/13  1459 History   First MD Initiated Contact with Patient 12/07/13 1519     Chief Complaint  Patient presents with  . Emesis  . Chest Pain     (Consider location/radiation/quality/duration/timing/severity/associated sxs/prior Treatment) Patient is a 53 y.o. female presenting with abdominal pain. The history is provided by the patient and medical records.  Abdominal Pain Pain location:  Epigastric Pain quality: aching and sharp   Pain radiates to:  Epigastric region and chest Pain severity:  Severe Onset quality:  Gradual Duration:  3 weeks Timing:  Intermittent Progression:  Waxing and waning Chronicity:  Chronic Context: medication withdrawal   Context: not alcohol use, not awakening from sleep, not diet changes, not eating, not laxative use, not previous surgeries, not recent illness, not recent sexual activity, not recent travel, not retching, not sick contacts, not suspicious food intake and not trauma   Relieved by: PO narcotics. Worsened by:  Nothing tried Ineffective treatments: PO antiemetics. Associated symptoms: chest pain, diarrhea and vomiting   Associated symptoms: no anorexia, no belching, no chills, no constipation, no cough, no dysuria, no fatigue, no fever, no flatus, no hematemesis, no hematochezia, no hematuria, no melena, no shortness of breath, no sore throat, no vaginal bleeding and no vaginal discharge   Risk factors: obesity   Risk factors: no alcohol abuse, no aspirin use, not elderly, has not had multiple surgeries, no NSAID use and no recent hospitalization     Patient has a history of esophageal dysmotility disorder and gastropathy. She has presented to the emergency department multiple times the past month for nausea vomiting and abdominal pain. On prior visits her symptoms resolved with IV antiemetics and IV analgesics in addition to IV fluids. She has been dehydrated in the past and has had borderline  elevation of her lipase after vomiting. Patient states that today's episode is similar to prior episodes also endorsing diarrhea which is typical denies any rectal bleeding denies any vaginal symptoms or complaints. Patient was previously advised to followup with GI after her last visit and did not have followup scheduled currently.  Past Medical History  Diagnosis Date  . Depression   . Migraine   . Fibromyalgia   . Hearing impairment   . Arthritis   . Cardiomyopathy   . Pulmonary embolism   . Gastritis   . Osteoporosis   . Anemia   . Allergy     SEASONAL  . Anxiety   . GERD (gastroesophageal reflux disease)   . Hypertension     [name], take htn medication to regulate heart beat.  . Sleep apnea    Past Surgical History  Procedure Laterality Date  . Appendectomy    . Inner ear surgery      TUBES  . Tonsillectomy and adenoidectomy    . Tubal ligation    . Laparoscopic cholecystectomy  2012  . Cardiac surgery  January 2007    mitral valve repair  . Cardiac surgery  1973    atrial septal defect   Family History  Problem Relation Age of Onset  . Breast cancer Mother     bilateral; ages 3 and 55; TAH/BSO ~50  . Depression Sister   . Heart disease Father   . Hypertension Father    History  Substance Use Topics  . Smoking status: Never Smoker   . Smokeless tobacco: Not on file  . Alcohol Use: No   OB History   Grav Para  Term Preterm Abortions TAB SAB Ect Mult Living   2 2 2       2      Review of Systems  Constitutional: Negative.  Negative for fever, chills and fatigue.  HENT: Negative.  Negative for sore throat.   Eyes: Negative.   Respiratory: Negative.  Negative for cough and shortness of breath.   Cardiovascular: Positive for chest pain.  Gastrointestinal: Positive for vomiting, abdominal pain and diarrhea. Negative for constipation, melena, hematochezia, anorexia, flatus and hematemesis.  Endocrine: Negative.   Genitourinary: Negative.  Negative for  dysuria, hematuria, vaginal bleeding and vaginal discharge.  Musculoskeletal: Negative.   Skin: Negative.   Allergic/Immunologic: Negative.   Neurological: Negative.   Hematological: Negative.   Psychiatric/Behavioral: Negative.       Allergies  Depakote and Valium  Home Medications   Prior to Admission medications   Medication Sig Start Date End Date Taking? Authorizing Provider  ARIPiprazole (ABILIFY) 5 MG tablet Take 5 mg by mouth daily.   Yes Historical Provider, MD  aspirin EC 81 MG tablet Take 81 mg by mouth every morning.   Yes Historical Provider, MD  BuPROPion HCl ER, XL, (FORFIVO XL) 450 MG TB24 Take 450 mg by mouth every morning. 08/22/13  Yes Norma Fredrickson, MD  Calcium Carbonate-Vitamin D (CALCIUM 600 + D PO) Take 1 tablet by mouth 2 (two) times daily.   Yes Historical Provider, MD  carvedilol (COREG) 25 MG tablet Take 1 tablet (25 mg total) by mouth 2 (two) times daily with a meal. 07/23/13  Yes Candee Furbish, MD  desvenlafaxine (PRISTIQ) 100 MG 24 hr tablet Take 1 tablet (100 mg total) by mouth daily. 08/22/13  Yes Norma Fredrickson, MD  Flaxseed, Linseed, (FLAXSEED OIL) 1000 MG CAPS Take 1,000 mg by mouth 2 (two) times daily.   Yes Historical Provider, MD  hydrochlorothiazide (MICROZIDE) 12.5 MG capsule Take 12.5 mg by mouth every morning.    Yes Historical Provider, MD  HYDROcodone-acetaminophen (NORCO/VICODIN) 5-325 MG per tablet Take 1-2 tablets by mouth every 6 (six) hours as needed for moderate pain. 11/21/13  Yes Antonietta Breach, PA-C  loratadine (CLARITIN) 10 MG tablet Take 10 mg by mouth daily.   Yes Historical Provider, MD  meloxicam (MOBIC) 15 MG tablet Take 15 mg by mouth daily.  11/12/13  Yes Historical Provider, MD  mirabegron ER (MYRBETRIQ) 25 MG TB24 tablet Take 1 tablet (25 mg total) by mouth daily. 06/13/13  Yes Terrance Mass, MD  Multiple Vitamin (MULTIVITAMIN) tablet Take 1 tablet by mouth daily.    Yes Historical Provider, MD  ondansetron (ZOFRAN) 4 MG tablet Take  4 mg by mouth every 6 (six) hours as needed for nausea or vomiting.   Yes Historical Provider, MD  pantoprazole (PROTONIX) 40 MG tablet Take 1 tablet (40 mg total) by mouth daily. 10/29/13  Yes Milus Banister, MD  prochlorperazine (COMPAZINE) 10 MG tablet Take 10 mg by mouth every 6 (six) hours as needed for nausea or vomiting.  10/26/13  Yes Historical Provider, MD  promethazine (PHENERGAN) 25 MG tablet Take 1 tablet (25 mg total) by mouth every 6 (six) hours as needed for nausea or vomiting. 11/21/13  Yes Antonietta Breach, PA-C  promethazine (PHENERGAN) 25 MG suppository Place 1 suppository (25 mg total) rectally every 6 (six) hours as needed for nausea or vomiting. 12/07/13   Hoyle Sauer, MD   BP 138/88  Pulse 98  Temp(Src) 98.8 F (37.1 C) (Oral)  Resp 22  Wt 150 lb (68.04  kg)  SpO2 98% Physical Exam  Constitutional: She is oriented to person, place, and time. She appears well-developed and well-nourished. No distress.  HENT:  Head: Normocephalic and atraumatic.  Right Ear: External ear normal.  Left Ear: External ear normal.  Nose: Nose normal.  Mouth/Throat: Oropharynx is clear and moist. No oropharyngeal exudate.  Eyes: Conjunctivae and EOM are normal. Pupils are equal, round, and reactive to light. Right eye exhibits no discharge. Left eye exhibits no discharge. No scleral icterus.  Neck: Normal range of motion. Neck supple. No JVD present. No tracheal deviation present. No thyromegaly present.  Cardiovascular: Regular rhythm, normal heart sounds and intact distal pulses.  Tachycardia present.  Exam reveals no gallop and no friction rub.   No murmur heard. Pulmonary/Chest: Effort normal and breath sounds normal. No stridor. No respiratory distress. She has no wheezes. She has no rales. She exhibits no tenderness.  Abdominal: Soft. Bowel sounds are normal. She exhibits no distension and no mass. There is no tenderness. There is no rebound and no guarding.  Musculoskeletal: Normal range  of motion. She exhibits no edema and no tenderness.  Lymphadenopathy:    She has no cervical adenopathy.  Neurological: She is alert and oriented to person, place, and time.  Skin: Skin is warm and dry. No rash noted. She is not diaphoretic. No erythema. No pallor.  Psychiatric: She has a normal mood and affect. Her behavior is normal. Judgment and thought content normal.    ED Course  Procedures (including critical care time) Labs Review Labs Reviewed  BASIC METABOLIC PANEL - Abnormal; Notable for the following:    GFR calc non Af Amer 76 (*)    GFR calc Af Amer 88 (*)    Anion gap 20 (*)    All other components within normal limits  HEPATIC FUNCTION PANEL - Abnormal; Notable for the following:    AST 38 (*)    All other components within normal limits  CBC  LIPASE, BLOOD  LACTIC ACID, PLASMA  I-STAT TROPOININ, ED    Imaging Review No results found.   EKG Interpretation None      Date: 12/07/2013  Rate: 116  Rhythm: sinus tachycardia  QRS Axis: normal  Intervals: normal  ST/T Wave abnormalities: nonspecific ST/T changes  Conduction Disutrbances: possibleleft atrial enlargement  Narrative Interpretation:   Old EKG Reviewed: no significant changes noted  From prior    MDM   Final diagnoses:  Chronic abdominal pain  Non-intractable vomiting with nausea, vomiting of unspecified type   Do not suspect serious underlying intra-abdominal pathology as patient has had similar episodes in the past. Patient has chest pain associated with recurrent episodes of vomiting and retching has not had any bloody emesis and has not had any shortness of breath. Low concern for esophageal tear or perforation.   She endorses feeling a shaky sensation in her chest and is tachycardic to the 1-teens likely secondary to dehydration.  Blood pressure blood pressures initially measured as hypertensive however on repeat what pressure testing and manual blood pressure testing patient has normal  blood pressures in the 749 systolic range. She is mildly tachycardic consistent with dehydration from vomiting. We'll give IV fluids IV antiemetics and IV pain control. We'll check basic labs and repeat lipase to evaluate for any alterations in her electrolytes or other metabolic markers. Patient's symptoms have frequently coincided with withdrawal from narcotic medications. She did receive some Norco at her last emergency department visit and is out of it.  Po antiemetics at home were ineffective.  Patient endorses improvement of symptoms following IV and antiemetics and pain control. Requesting additional IV medication following return of  labs at baseline. Advised that additional oral pain control will be provided and patient will be prescribed rectal Phenergan for her nausea. Patient has been able to tolerate liquids by mouth in emergency department. Patient requesting additional prescription for oral narcotics prior to discharge. Patient advised due to her chronic abdominal pain and chronic medical conditions her oral narcotics may be prescribed through her primary care physician or a pain specialist. Expressed that recurrent short courses of oral narcotics without ongoing plan would likely exacerbate her underlying GI pathology as withdrawal symptoms would mimic her GI illness and worsen her epigastric and esophageal pain.   Reiterated this recommendation following confirmation with attending physician.  Pt again requested po pain medication Rx upon receipt of d/c instructions.  Again reaffirmed prior recommendations.  Presentation c/w features of opioid dependence and withdrawal.  Patient and family given return precautions for abdominal pain.  Advised to return for worsening symptoms including chest pain, shortness of breath, severe headache, intractable nausea or vomiting, fever, or chills, inability to take medications, or other acute concerns.  Advised to follow up with PCP in 3 days.  Patient and  family expressed understanding of follow plan, plan of care, and return precautions.  All questions answered prior to discharge.  Patient was discharged in stable condition with family, ambulating without difficulty.  Patient care was discussed with my attending, Dr. Jeanell Sparrow.     Hoyle Sauer, MD 12/08/13 [phone]

## 2013-12-07 NOTE — Discharge Instructions (Signed)

## 2013-12-07 NOTE — ED Notes (Signed)
Spoke with MD about prescription for pain medicine he said that we would not be giving one and that he discussed this with the pt. The pt notified.

## 2013-12-07 NOTE — ED Notes (Signed)
NAD at this time.  

## 2013-12-07 NOTE — ED Notes (Signed)
PT REPORTS SHE BEGAN VOMITING LAST NIGHT. vOMITED SEVERAL TIMES THIS am. REPORTS PAIN IN SUBSTERNAL CHEST AND PALPITATIONS. PT AWAKE, ALERT, ORIENTED X4, SKIN PALE.

## 2013-12-10 NOTE — ED Provider Notes (Signed)
53 y.o. Female with chronic abdominal pain presents today with worsened epigastric pain. Patient with soft abdomen. Work up without acute abnormalities.  I performed a history and physical examination of [name] and discussed her management with Dr. Estanislado Spire.  I agree with the history, physical, assessment, and plan of care, with the following exceptions: None  I was present for the following procedures: None Time Spent in Critical Care of the patient: None Time spent in discussions with the patient and family: 10  Dawt Reeb Shelda Jakes, MD 12/10/13 [phone]

## 2013-12-19 ENCOUNTER — Encounter (HOSPITAL_BASED_OUTPATIENT_CLINIC_OR_DEPARTMENT_OTHER): Payer: Self-pay | Admitting: Emergency Medicine

## 2013-12-19 ENCOUNTER — Emergency Department (HOSPITAL_BASED_OUTPATIENT_CLINIC_OR_DEPARTMENT_OTHER)
Admission: EM | Admit: 2013-12-19 | Discharge: 2013-12-19 | Disposition: A | Discharge status: Home / Self Care | Payer: Medicare Other | Attending: Emergency Medicine | Admitting: Emergency Medicine

## 2013-12-19 DIAGNOSIS — Z7982 Long term (current) use of aspirin: Secondary | ICD-10-CM | POA: Insufficient documentation

## 2013-12-19 DIAGNOSIS — G8929 Other chronic pain: Secondary | ICD-10-CM | POA: Insufficient documentation

## 2013-12-19 DIAGNOSIS — F3289 Other specified depressive episodes: Secondary | ICD-10-CM | POA: Insufficient documentation

## 2013-12-19 DIAGNOSIS — M129 Arthropathy, unspecified: Secondary | ICD-10-CM | POA: Diagnosis not present

## 2013-12-19 DIAGNOSIS — R1013 Epigastric pain: Secondary | ICD-10-CM | POA: Insufficient documentation

## 2013-12-19 DIAGNOSIS — I10 Essential (primary) hypertension: Secondary | ICD-10-CM | POA: Insufficient documentation

## 2013-12-19 DIAGNOSIS — Z9889 Other specified postprocedural states: Secondary | ICD-10-CM | POA: Diagnosis not present

## 2013-12-19 DIAGNOSIS — Z3202 Encounter for pregnancy test, result negative: Secondary | ICD-10-CM | POA: Insufficient documentation

## 2013-12-19 DIAGNOSIS — Z9089 Acquired absence of other organs: Secondary | ICD-10-CM | POA: Insufficient documentation

## 2013-12-19 DIAGNOSIS — Z8709 Personal history of other diseases of the respiratory system: Secondary | ICD-10-CM | POA: Diagnosis not present

## 2013-12-19 DIAGNOSIS — H9190 Unspecified hearing loss, unspecified ear: Secondary | ICD-10-CM | POA: Insufficient documentation

## 2013-12-19 DIAGNOSIS — Z79899 Other long term (current) drug therapy: Secondary | ICD-10-CM | POA: Diagnosis not present

## 2013-12-19 DIAGNOSIS — K219 Gastro-esophageal reflux disease without esophagitis: Secondary | ICD-10-CM | POA: Insufficient documentation

## 2013-12-19 DIAGNOSIS — Z862 Personal history of diseases of the blood and blood-forming organs and certain disorders involving the immune mechanism: Secondary | ICD-10-CM | POA: Insufficient documentation

## 2013-12-19 DIAGNOSIS — F329 Major depressive disorder, single episode, unspecified: Secondary | ICD-10-CM | POA: Diagnosis not present

## 2013-12-19 DIAGNOSIS — Z8669 Personal history of other diseases of the nervous system and sense organs: Secondary | ICD-10-CM | POA: Diagnosis not present

## 2013-12-19 DIAGNOSIS — Z86711 Personal history of pulmonary embolism: Secondary | ICD-10-CM | POA: Insufficient documentation

## 2013-12-19 DIAGNOSIS — R109 Unspecified abdominal pain: Secondary | ICD-10-CM

## 2013-12-19 LAB — CBC WITH DIFFERENTIAL/PLATELET
BASOS ABS: 0 10*3/uL (ref 0.0–0.1)
Basophils Relative: 1 % (ref 0–1)
Eosinophils Absolute: 0 10*3/uL (ref 0.0–0.7)
Eosinophils Relative: 1 % (ref 0–5)
HCT: 41.8 % (ref 36.0–46.0)
Hemoglobin: 14.9 g/dL (ref 12.0–15.0)
LYMPHS ABS: 0.6 10*3/uL — AB (ref 0.7–4.0)
LYMPHS PCT: 19 % (ref 12–46)
MCH: 32.7 pg (ref 26.0–34.0)
MCHC: 35.6 g/dL (ref 30.0–36.0)
MCV: 91.7 fL (ref 78.0–100.0)
Monocytes Absolute: 0.3 10*3/uL (ref 0.1–1.0)
Monocytes Relative: 9 % (ref 3–12)
NEUTROS ABS: 2.3 10*3/uL (ref 1.7–7.7)
Neutrophils Relative %: 70 % (ref 43–77)
PLATELETS: 214 10*3/uL (ref 150–400)
RBC: 4.56 MIL/uL (ref 3.87–5.11)
RDW: 14.9 % (ref 11.5–15.5)
WBC: 3.3 10*3/uL — AB (ref 4.0–10.5)

## 2013-12-19 LAB — COMPREHENSIVE METABOLIC PANEL
ALT: 27 U/L (ref 0–35)
AST: 35 U/L (ref 0–37)
Albumin: 3.9 g/dL (ref 3.5–5.2)
Alkaline Phosphatase: 69 U/L (ref 39–117)
Anion gap: 20 — ABNORMAL HIGH (ref 5–15)
BUN: 16 mg/dL (ref 6–23)
CALCIUM: 9.8 mg/dL (ref 8.4–10.5)
CO2: 23 mEq/L (ref 19–32)
CREATININE: 0.9 mg/dL (ref 0.50–1.10)
Chloride: 99 mEq/L (ref 96–112)
GFR calc non Af Amer: 72 mL/min — ABNORMAL LOW (ref >90)
GFR, EST AFRICAN AMERICAN: 83 mL/min — AB (ref >90)
GLUCOSE: 106 mg/dL — AB (ref 70–99)
Potassium: 4.1 mEq/L (ref 3.7–5.3)
SODIUM: 142 meq/L (ref 137–147)
TOTAL PROTEIN: 7.2 g/dL (ref 6.0–8.3)
Total Bilirubin: 0.4 mg/dL (ref 0.3–1.2)

## 2013-12-19 LAB — URINALYSIS, ROUTINE W REFLEX MICROSCOPIC
Bilirubin Urine: NEGATIVE
GLUCOSE, UA: NEGATIVE mg/dL
Ketones, ur: NEGATIVE mg/dL
LEUKOCYTES UA: NEGATIVE
NITRITE: NEGATIVE
PH: 6 (ref 5.0–8.0)
Protein, ur: NEGATIVE mg/dL
SPECIFIC GRAVITY, URINE: 1.024 (ref 1.005–1.030)
Urobilinogen, UA: 0.2 mg/dL (ref 0.0–1.0)

## 2013-12-19 LAB — URINE MICROSCOPIC-ADD ON

## 2013-12-19 LAB — PREGNANCY, URINE: PREG TEST UR: NEGATIVE

## 2013-12-19 LAB — LIPASE, BLOOD: LIPASE: 37 U/L (ref 11–59)

## 2013-12-19 MED ORDER — ONDANSETRON 4 MG PO TBDP: 4.0000 mg | ORAL_TABLET | Freq: Three times a day (TID) | ORAL | Status: DC | PRN

## 2013-12-19 MED ORDER — HYDROCODONE-ACETAMINOPHEN 5-325 MG PO TABS: 1.0000 | ORAL_TABLET | Freq: Four times a day (QID) | ORAL | Status: DC | PRN

## 2013-12-19 MED ORDER — HYDROMORPHONE HCL PF 1 MG/ML IJ SOLN
1.0000 mg | Freq: Once | INTRAMUSCULAR | Status: AC
  Administered 2013-12-19: 1 mg via INTRAVENOUS
  Filled 2013-12-19: qty 1

## 2013-12-19 MED ORDER — ONDANSETRON HCL 4 MG/2ML IJ SOLN
4.0000 mg | Freq: Once | INTRAMUSCULAR | Status: AC
  Administered 2013-12-19: 4 mg via INTRAVENOUS
  Filled 2013-12-19: qty 2

## 2013-12-19 MED ORDER — HYDROMORPHONE HCL PF 1 MG/ML IJ SOLN
INTRAMUSCULAR | Status: AC
  Filled 2013-12-19: qty 1

## 2013-12-19 MED ORDER — HYDROMORPHONE HCL PF 1 MG/ML IJ SOLN
0.5000 mg | Freq: Once | INTRAMUSCULAR | Status: AC
  Administered 2013-12-19: 0.5 mg via INTRAVENOUS

## 2013-12-19 NOTE — ED Notes (Addendum)
Pt c/o abd pain with n/v  X 6 hrs pt reports out of pain meds.  Pt requesting "somethingstronger than vicodin"

## 2013-12-19 NOTE — Discharge Instructions (Signed)

## 2013-12-19 NOTE — ED Provider Notes (Signed)
CSN: [id number]     Arrival date & time 12/19/13  1355 History   First MD Initiated Contact with Patient 12/19/13 1406     Chief Complaint  Patient presents with  . Abdominal Pain     (Consider location/radiation/quality/duration/timing/severity/associated sxs/prior Treatment) HPI Comments: Pt comes in c/o of chronic abdominal pain that has been going on for the last couple of months. Pt has been seen by gi and pcp for the symptoms. She has had colonoscopy and endoscopy. Pt states that she is supposed to see her primary care for chronic pain referral. No fever. Is having epigastric pain with vomiting and diarrhea for the last 2 days.   The history is provided by the patient. No language interpreter was used.    Past Medical History  Diagnosis Date  . Depression   . Migraine   . Fibromyalgia   . Hearing impairment   . Arthritis   . Cardiomyopathy   . Pulmonary embolism   . Gastritis   . Osteoporosis   . Anemia   . Allergy     SEASONAL  . Anxiety   . GERD (gastroesophageal reflux disease)   . Hypertension     [name], take htn medication to regulate heart beat.  . Sleep apnea    Past Surgical History  Procedure Laterality Date  . Appendectomy    . Inner ear surgery      TUBES  . Tonsillectomy and adenoidectomy    . Tubal ligation    . Laparoscopic cholecystectomy  2012  . Cardiac surgery  January 2007    mitral valve repair  . Cardiac surgery  1973    atrial septal defect   Family History  Problem Relation Age of Onset  . Breast cancer Mother     bilateral; ages 74 and 42; TAH/BSO ~50  . Depression Sister   . Heart disease Father   . Hypertension Father    History  Substance Use Topics  . Smoking status: Never Smoker   . Smokeless tobacco: Not on file  . Alcohol Use: No   OB History   Grav Para Term Preterm Abortions TAB SAB Ect Mult Living   2 2 2       2      Review of Systems  Constitutional: Negative.   Respiratory: Negative.   Cardiovascular:  Negative.       Allergies  Depakote and Valium  Home Medications   Prior to Admission medications   Medication Sig Start Date End Date Taking? Authorizing Provider  promethazine (PHENERGAN) 25 MG tablet Take 1 tablet (25 mg total) by mouth every 6 (six) hours as needed for nausea or vomiting. 11/21/13  Yes Antonietta Breach, PA-C  ARIPiprazole (ABILIFY) 5 MG tablet Take 5 mg by mouth daily.    Historical Provider, MD  aspirin EC 81 MG tablet Take 81 mg by mouth every morning.    Historical Provider, MD  BuPROPion HCl ER, XL, (FORFIVO XL) 450 MG TB24 Take 450 mg by mouth every morning. 08/22/13   Norma Fredrickson, MD  Calcium Carbonate-Vitamin D (CALCIUM 600 + D PO) Take 1 tablet by mouth 2 (two) times daily.    Historical Provider, MD  carvedilol (COREG) 25 MG tablet Take 1 tablet (25 mg total) by mouth 2 (two) times daily with a meal. 07/23/13   Candee Furbish, MD  desvenlafaxine (PRISTIQ) 100 MG 24 hr tablet Take 1 tablet (100 mg total) by mouth daily. 08/22/13   Norma Fredrickson, MD  Flaxseed, Linseed, (  FLAXSEED OIL) 1000 MG CAPS Take 1,000 mg by mouth 2 (two) times daily.    Historical Provider, MD  hydrochlorothiazide (MICROZIDE) 12.5 MG capsule Take 12.5 mg by mouth every morning.     Historical Provider, MD  HYDROcodone-acetaminophen (NORCO/VICODIN) 5-325 MG per tablet Take 1-2 tablets by mouth every 6 (six) hours as needed for moderate pain. 11/21/13   Antonietta Breach, PA-C  loratadine (CLARITIN) 10 MG tablet Take 10 mg by mouth daily.    Historical Provider, MD  meloxicam (MOBIC) 15 MG tablet Take 15 mg by mouth daily.  11/12/13   Historical Provider, MD  mirabegron ER (MYRBETRIQ) 25 MG TB24 tablet Take 1 tablet (25 mg total) by mouth daily. 06/13/13   Terrance Mass, MD  Multiple Vitamin (MULTIVITAMIN) tablet Take 1 tablet by mouth daily.     Historical Provider, MD  ondansetron (ZOFRAN) 4 MG tablet Take 4 mg by mouth every 6 (six) hours as needed for nausea or vomiting.    Historical Provider, MD   pantoprazole (PROTONIX) 40 MG tablet Take 1 tablet (40 mg total) by mouth daily. 10/29/13   Milus Banister, MD  prochlorperazine (COMPAZINE) 10 MG tablet Take 10 mg by mouth every 6 (six) hours as needed for nausea or vomiting.  10/26/13   Historical Provider, MD  promethazine (PHENERGAN) 25 MG suppository Place 1 suppository (25 mg total) rectally every 6 (six) hours as needed for nausea or vomiting. 12/07/13   Hoyle Sauer, MD   BP 148/96  Pulse 88  Temp(Src) 98.2 F (36.8 C)  SpO2 100% Physical Exam  Nursing note and vitals reviewed. Constitutional: She is oriented to person, place, and time.  HENT:  Head: Normocephalic and atraumatic.  Cardiovascular: Normal rate and regular rhythm.   Pulmonary/Chest: Effort normal and breath sounds normal.  Abdominal: Soft. Bowel sounds are normal. There is tenderness in the epigastric area.  Musculoskeletal: Normal range of motion.  Neurological: She is alert and oriented to person, place, and time.  Skin: Skin is warm and dry.    ED Course  Procedures (including critical care time) Labs Review Labs Reviewed  COMPREHENSIVE METABOLIC PANEL  LIPASE, BLOOD  CBC WITH DIFFERENTIAL  URINALYSIS, ROUTINE W REFLEX MICROSCOPIC  PREGNANCY, URINE    Imaging Review No results found.   EKG Interpretation None      MDM   Final diagnoses:  Chronic abdominal pain    Pt is having chronic pain abdomen is not acute.pt is tolerating po here. Will send home with hydrocodone. Discussed with pt that she need to see gi her pcp and pain management    Glendell Docker, NP 12/19/13 1609

## 2013-12-20 DIAGNOSIS — R109 Unspecified abdominal pain: Secondary | ICD-10-CM | POA: Diagnosis not present

## 2013-12-20 NOTE — ED Provider Notes (Signed)
Medical screening examination/treatment/procedure(s) were performed by non-physician practitioner and as supervising physician I was immediately available for consultation/collaboration.   EKG Interpretation None        Hoy Morn, MD 12/20/13 [phone]

## 2014-01-26 ENCOUNTER — Encounter (HOSPITAL_BASED_OUTPATIENT_CLINIC_OR_DEPARTMENT_OTHER): Payer: Self-pay | Admitting: Emergency Medicine

## 2014-01-26 ENCOUNTER — Emergency Department (HOSPITAL_BASED_OUTPATIENT_CLINIC_OR_DEPARTMENT_OTHER)
Admission: EM | Admit: 2014-01-26 | Discharge: 2014-01-26 | Disposition: A | Discharge status: Home / Self Care | Payer: Medicare Other | Attending: Emergency Medicine | Admitting: Emergency Medicine

## 2014-01-26 DIAGNOSIS — Z862 Personal history of diseases of the blood and blood-forming organs and certain disorders involving the immune mechanism: Secondary | ICD-10-CM | POA: Diagnosis not present

## 2014-01-26 DIAGNOSIS — Z7982 Long term (current) use of aspirin: Secondary | ICD-10-CM | POA: Diagnosis not present

## 2014-01-26 DIAGNOSIS — Z86711 Personal history of pulmonary embolism: Secondary | ICD-10-CM | POA: Diagnosis not present

## 2014-01-26 DIAGNOSIS — Z791 Long term (current) use of non-steroidal anti-inflammatories (NSAID): Secondary | ICD-10-CM | POA: Insufficient documentation

## 2014-01-26 DIAGNOSIS — F3289 Other specified depressive episodes: Secondary | ICD-10-CM | POA: Diagnosis not present

## 2014-01-26 DIAGNOSIS — M810 Age-related osteoporosis without current pathological fracture: Secondary | ICD-10-CM | POA: Diagnosis not present

## 2014-01-26 DIAGNOSIS — G43809 Other migraine, not intractable, without status migrainosus: Secondary | ICD-10-CM

## 2014-01-26 DIAGNOSIS — M129 Arthropathy, unspecified: Secondary | ICD-10-CM | POA: Diagnosis not present

## 2014-01-26 DIAGNOSIS — F329 Major depressive disorder, single episode, unspecified: Secondary | ICD-10-CM | POA: Diagnosis not present

## 2014-01-26 DIAGNOSIS — G43909 Migraine, unspecified, not intractable, without status migrainosus: Secondary | ICD-10-CM | POA: Diagnosis not present

## 2014-01-26 DIAGNOSIS — I10 Essential (primary) hypertension: Secondary | ICD-10-CM | POA: Insufficient documentation

## 2014-01-26 DIAGNOSIS — Z79899 Other long term (current) drug therapy: Secondary | ICD-10-CM | POA: Insufficient documentation

## 2014-01-26 DIAGNOSIS — K219 Gastro-esophageal reflux disease without esophagitis: Secondary | ICD-10-CM | POA: Insufficient documentation

## 2014-01-26 DIAGNOSIS — Z8709 Personal history of other diseases of the respiratory system: Secondary | ICD-10-CM | POA: Insufficient documentation

## 2014-01-26 DIAGNOSIS — F411 Generalized anxiety disorder: Secondary | ICD-10-CM | POA: Insufficient documentation

## 2014-01-26 MED ORDER — KETOROLAC TROMETHAMINE 30 MG/ML IJ SOLN
30.0000 mg | Freq: Once | INTRAMUSCULAR | Status: AC
  Administered 2014-01-26: 30 mg via INTRAVENOUS
  Filled 2014-01-26: qty 1

## 2014-01-26 MED ORDER — SODIUM CHLORIDE 0.9 % IV BOLUS (SEPSIS)
1000.0000 mL | Freq: Once | INTRAVENOUS | Status: AC
  Administered 2014-01-26: 1000 mL via INTRAVENOUS

## 2014-01-26 MED ORDER — ONDANSETRON HCL 4 MG/2ML IJ SOLN
4.0000 mg | Freq: Once | INTRAMUSCULAR | Status: AC
  Administered 2014-01-26: 4 mg via INTRAVENOUS
  Filled 2014-01-26: qty 2

## 2014-01-26 MED ORDER — HYDROMORPHONE HCL 1 MG/ML IJ SOLN
1.0000 mg | Freq: Once | INTRAMUSCULAR | Status: AC
Start: 2014-01-26 — End: 2014-01-26
  Administered 2014-01-26: 1 mg via INTRAVENOUS
  Filled 2014-01-26: qty 1

## 2014-01-26 MED ORDER — HYDROMORPHONE HCL 1 MG/ML IJ SOLN
1.0000 mg | Freq: Once | INTRAMUSCULAR | Status: AC
  Administered 2014-01-26: 1 mg via INTRAVENOUS
  Filled 2014-01-26: qty 1

## 2014-01-26 NOTE — ED Notes (Signed)
Pt discharged to home with family. NAD.  

## 2014-01-26 NOTE — ED Provider Notes (Signed)
CSN: [id number]     Arrival date & time 01/26/14  1150 History   First MD Initiated Contact with Patient 01/26/14 1247     Chief Complaint  Patient presents with  . Migraine     (Consider location/radiation/quality/duration/timing/severity/associated sxs/prior Treatment) HPI 53 year old female presents with a recurrent migraine. She states started this morning at 8 AM. She states she gets these approximately 2-3 times per month. She typically takes Fioricet with relief but it did not work this time. She rates the pain as severe. She's had nausea and vomiting. No fevers, neck stiffness, or weakness or numbness. Says when she goes to the ER the headache cocktail usually does not work and it only Dilaudid relieves her symptoms. Besides more intense this headache is similar to the many migraine since she's had the past since she was age 1.  Past Medical History  Diagnosis Date  . Depression   . Migraine   . Fibromyalgia   . Hearing impairment   . Arthritis   . Cardiomyopathy   . Pulmonary embolism   . Gastritis   . Osteoporosis   . Anemia   . Allergy     SEASONAL  . Anxiety   . GERD (gastroesophageal reflux disease)   . Hypertension     [name], take htn medication to regulate heart beat.  . Sleep apnea    Past Surgical History  Procedure Laterality Date  . Appendectomy    . Inner ear surgery      TUBES  . Tonsillectomy and adenoidectomy    . Tubal ligation    . Laparoscopic cholecystectomy  2012  . Cardiac surgery  January 2007    mitral valve repair  . Cardiac surgery  1973    atrial septal defect   Family History  Problem Relation Age of Onset  . Breast cancer Mother     bilateral; ages 6 and 20; TAH/BSO ~50  . Depression Sister   . Heart disease Father   . Hypertension Father    History  Substance Use Topics  . Smoking status: Never Smoker   . Smokeless tobacco: Not on file  . Alcohol Use: No   OB History   Grav Para Term Preterm Abortions TAB SAB Ect Mult  Living   2 2 2       2      Review of Systems  Constitutional: Negative for fever.  Gastrointestinal: Positive for nausea and vomiting.  Neurological: Positive for headaches. Negative for weakness and numbness.  All other systems reviewed and are negative.     Allergies  Depakote and Valium  Home Medications   Prior to Admission medications   Medication Sig Start Date End Date Taking? Authorizing Provider  ARIPiprazole (ABILIFY) 5 MG tablet Take 5 mg by mouth daily.    Historical Provider, MD  aspirin EC 81 MG tablet Take 81 mg by mouth every morning.    Historical Provider, MD  BuPROPion HCl ER, XL, (FORFIVO XL) 450 MG TB24 Take 450 mg by mouth every morning. 08/22/13   Norma Fredrickson, MD  Calcium Carbonate-Vitamin D (CALCIUM 600 + D PO) Take 1 tablet by mouth 2 (two) times daily.    Historical Provider, MD  carvedilol (COREG) 25 MG tablet Take 1 tablet (25 mg total) by mouth 2 (two) times daily with a meal. 07/23/13   Candee Furbish, MD  desvenlafaxine (PRISTIQ) 100 MG 24 hr tablet Take 1 tablet (100 mg total) by mouth daily. 08/22/13   Norma Fredrickson, MD  Flaxseed, Linseed, (FLAXSEED OIL) 1000 MG CAPS Take 1,000 mg by mouth 2 (two) times daily.    Historical Provider, MD  hydrochlorothiazide (MICROZIDE) 12.5 MG capsule Take 12.5 mg by mouth every morning.     Historical Provider, MD  HYDROcodone-acetaminophen (NORCO/VICODIN) 5-325 MG per tablet Take 1-2 tablets by mouth every 6 (six) hours as needed for moderate pain. 11/21/13   Antonietta Breach, PA-C  HYDROcodone-acetaminophen (NORCO/VICODIN) 5-325 MG per tablet Take 1-2 tablets by mouth every 6 (six) hours as needed. 12/19/13   Glendell Docker, NP  loratadine (CLARITIN) 10 MG tablet Take 10 mg by mouth daily.    Historical Provider, MD  meloxicam (MOBIC) 15 MG tablet Take 15 mg by mouth daily.  11/12/13   Historical Provider, MD  mirabegron ER (MYRBETRIQ) 25 MG TB24 tablet Take 1 tablet (25 mg total) by mouth daily. 06/13/13   Terrance Mass,  MD  Multiple Vitamin (MULTIVITAMIN) tablet Take 1 tablet by mouth daily.     Historical Provider, MD  ondansetron (ZOFRAN ODT) 4 MG disintegrating tablet Take 1 tablet (4 mg total) by mouth every 8 (eight) hours as needed for nausea or vomiting. 12/19/13   Glendell Docker, NP  ondansetron (ZOFRAN) 4 MG tablet Take 4 mg by mouth every 6 (six) hours as needed for nausea or vomiting.    Historical Provider, MD  pantoprazole (PROTONIX) 40 MG tablet Take 1 tablet (40 mg total) by mouth daily. 10/29/13   Milus Banister, MD  prochlorperazine (COMPAZINE) 10 MG tablet Take 10 mg by mouth every 6 (six) hours as needed for nausea or vomiting.  10/26/13   Historical Provider, MD  promethazine (PHENERGAN) 25 MG suppository Place 1 suppository (25 mg total) rectally every 6 (six) hours as needed for nausea or vomiting. 12/07/13   Hoyle Sauer, MD  promethazine (PHENERGAN) 25 MG tablet Take 1 tablet (25 mg total) by mouth every 6 (six) hours as needed for nausea or vomiting. 11/21/13   Antonietta Breach, PA-C   BP 143/110  Pulse 102  Temp(Src) 98.4 F (36.9 C)  Resp 20  Wt 145 lb (65.772 kg)  SpO2 99% Physical Exam  Nursing note and vitals reviewed. Constitutional: She is oriented to person, place, and time. She appears well-developed and well-nourished.  HENT:  Head: Normocephalic and atraumatic.  Right Ear: External ear normal.  Left Ear: External ear normal.  Nose: Nose normal.  Eyes: EOM are normal. Pupils are equal, round, and reactive to light. Right eye exhibits no discharge. Left eye exhibits no discharge.  Neck:  Normal passive ROM of neck w/o pain or stiffness. No meningismus  Cardiovascular: Normal rate, regular rhythm and normal heart sounds.   Pulmonary/Chest: Effort normal and breath sounds normal.  Abdominal: Soft. She exhibits no distension. There is no tenderness.  Neurological: She is alert and oriented to person, place, and time. She has normal strength. No cranial nerve deficit or sensory  deficit. She exhibits normal muscle tone. GCS eye subscore is 4. GCS verbal subscore is 5. GCS motor subscore is 6.  Reflex Scores:      Bicep reflexes are 2+ on the right side and 2+ on the left side.      Patellar reflexes are 2+ on the right side and 2+ on the left side. CN 2-12 grossly intact. 5/5 strength in all 4 extremities. Normal finger to nose.  Skin: Skin is warm and dry.    ED Course  Procedures (including critical care time) Labs Review Labs Reviewed -  No data to display  Imaging Review No results found.   EKG Interpretation None      MDM   Final diagnoses:  Other migraine without status migrainosus, not intractable    Patient with recurrent migraine. No red flags such as fever, neck stiffness, weakness, blurry vision, or different type of headache. I have low suspicion of meningitis, subarachnoid hemorrhage, mass, or other injury cranial pathology. Pain improved with IV medicines here. Nausea resolved. She is stable for discharge.    Ephraim Hamburger, MD 01/26/14 1524

## 2014-01-26 NOTE — Discharge Instructions (Signed)

## 2014-01-26 NOTE — ED Notes (Signed)
Given ice water.

## 2014-01-26 NOTE — ED Notes (Signed)
Patient reports that she awoke with migraine this am. Nausea and vomiting with same, took 2 Fioricet without relief.

## 2014-01-28 DIAGNOSIS — J329 Chronic sinusitis, unspecified: Secondary | ICD-10-CM | POA: Diagnosis not present

## 2014-01-28 DIAGNOSIS — J069 Acute upper respiratory infection, unspecified: Secondary | ICD-10-CM | POA: Diagnosis not present

## 2014-02-04 ENCOUNTER — Encounter: Payer: Self-pay | Admitting: Gastroenterology

## 2014-02-15 ENCOUNTER — Encounter: Payer: Self-pay | Admitting: *Deleted

## 2014-02-21 DIAGNOSIS — Z23 Encounter for immunization: Secondary | ICD-10-CM | POA: Diagnosis not present

## 2014-02-21 DIAGNOSIS — H9190 Unspecified hearing loss, unspecified ear: Secondary | ICD-10-CM | POA: Diagnosis not present

## 2014-02-21 DIAGNOSIS — I059 Rheumatic mitral valve disease, unspecified: Secondary | ICD-10-CM | POA: Diagnosis not present

## 2014-02-21 DIAGNOSIS — M542 Cervicalgia: Secondary | ICD-10-CM | POA: Diagnosis not present

## 2014-02-23 ENCOUNTER — Other Ambulatory Visit (HOSPITAL_COMMUNITY): Payer: Self-pay | Admitting: Psychiatry

## 2014-02-25 ENCOUNTER — Ambulatory Visit
Admission: RE | Admit: 2014-02-25 | Discharge: 2014-02-25 | Disposition: A | Discharge status: Home / Self Care | Payer: Medicare Other | Source: Ambulatory Visit | Attending: Family Medicine | Admitting: Family Medicine

## 2014-02-25 ENCOUNTER — Other Ambulatory Visit: Payer: Self-pay | Admitting: Family Medicine

## 2014-02-25 DIAGNOSIS — M5032 Other cervical disc degeneration, mid-cervical region: Secondary | ICD-10-CM | POA: Diagnosis not present

## 2014-02-25 DIAGNOSIS — M542 Cervicalgia: Secondary | ICD-10-CM

## 2014-02-25 DIAGNOSIS — M5031 Other cervical disc degeneration,  high cervical region: Secondary | ICD-10-CM | POA: Diagnosis not present

## 2014-02-25 DIAGNOSIS — M4312 Spondylolisthesis, cervical region: Secondary | ICD-10-CM | POA: Diagnosis not present

## 2014-02-25 DIAGNOSIS — M792 Neuralgia and neuritis, unspecified: Secondary | ICD-10-CM

## 2014-03-01 ENCOUNTER — Emergency Department (HOSPITAL_COMMUNITY): Payer: Medicare Other

## 2014-03-01 ENCOUNTER — Encounter (HOSPITAL_COMMUNITY): Payer: Self-pay | Admitting: Emergency Medicine

## 2014-03-01 ENCOUNTER — Emergency Department (HOSPITAL_COMMUNITY)
Admission: EM | Admit: 2014-03-01 | Discharge: 2014-03-01 | Disposition: A | Discharge status: Home / Self Care | Payer: Medicare Other | Attending: Emergency Medicine | Admitting: Emergency Medicine

## 2014-03-01 DIAGNOSIS — Z9889 Other specified postprocedural states: Secondary | ICD-10-CM | POA: Insufficient documentation

## 2014-03-01 DIAGNOSIS — R112 Nausea with vomiting, unspecified: Secondary | ICD-10-CM | POA: Diagnosis not present

## 2014-03-01 DIAGNOSIS — F329 Major depressive disorder, single episode, unspecified: Secondary | ICD-10-CM | POA: Diagnosis not present

## 2014-03-01 DIAGNOSIS — R0789 Other chest pain: Secondary | ICD-10-CM | POA: Insufficient documentation

## 2014-03-01 DIAGNOSIS — G43909 Migraine, unspecified, not intractable, without status migrainosus: Secondary | ICD-10-CM | POA: Diagnosis not present

## 2014-03-01 DIAGNOSIS — R197 Diarrhea, unspecified: Secondary | ICD-10-CM | POA: Diagnosis not present

## 2014-03-01 DIAGNOSIS — Z9089 Acquired absence of other organs: Secondary | ICD-10-CM | POA: Insufficient documentation

## 2014-03-01 DIAGNOSIS — Z79899 Other long term (current) drug therapy: Secondary | ICD-10-CM | POA: Insufficient documentation

## 2014-03-01 DIAGNOSIS — K219 Gastro-esophageal reflux disease without esophagitis: Secondary | ICD-10-CM | POA: Diagnosis not present

## 2014-03-01 DIAGNOSIS — R1013 Epigastric pain: Secondary | ICD-10-CM | POA: Insufficient documentation

## 2014-03-01 DIAGNOSIS — R079 Chest pain, unspecified: Secondary | ICD-10-CM | POA: Diagnosis not present

## 2014-03-01 DIAGNOSIS — Z862 Personal history of diseases of the blood and blood-forming organs and certain disorders involving the immune mechanism: Secondary | ICD-10-CM | POA: Diagnosis not present

## 2014-03-01 DIAGNOSIS — R072 Precordial pain: Secondary | ICD-10-CM | POA: Diagnosis not present

## 2014-03-01 DIAGNOSIS — Z86711 Personal history of pulmonary embolism: Secondary | ICD-10-CM | POA: Insufficient documentation

## 2014-03-01 DIAGNOSIS — Z7982 Long term (current) use of aspirin: Secondary | ICD-10-CM | POA: Insufficient documentation

## 2014-03-01 DIAGNOSIS — Z791 Long term (current) use of non-steroidal anti-inflammatories (NSAID): Secondary | ICD-10-CM | POA: Diagnosis not present

## 2014-03-01 DIAGNOSIS — Z9851 Tubal ligation status: Secondary | ICD-10-CM | POA: Insufficient documentation

## 2014-03-01 DIAGNOSIS — F419 Anxiety disorder, unspecified: Secondary | ICD-10-CM | POA: Insufficient documentation

## 2014-03-01 DIAGNOSIS — M1990 Unspecified osteoarthritis, unspecified site: Secondary | ICD-10-CM | POA: Diagnosis not present

## 2014-03-01 DIAGNOSIS — I10 Essential (primary) hypertension: Secondary | ICD-10-CM | POA: Diagnosis not present

## 2014-03-01 LAB — CBC
HCT: 41.4 % (ref 36.0–46.0)
Hemoglobin: 14.9 g/dL (ref 12.0–15.0)
MCH: 34.7 pg — ABNORMAL HIGH (ref 26.0–34.0)
MCHC: 36 g/dL (ref 30.0–36.0)
MCV: 96.5 fL (ref 78.0–100.0)
PLATELETS: 243 10*3/uL (ref 150–400)
RBC: 4.29 MIL/uL (ref 3.87–5.11)
RDW: 13.6 % (ref 11.5–15.5)
WBC: 4 10*3/uL (ref 4.0–10.5)

## 2014-03-01 LAB — COMPREHENSIVE METABOLIC PANEL
ALBUMIN: 4.4 g/dL (ref 3.5–5.2)
ALK PHOS: 60 U/L (ref 39–117)
ALT: 21 U/L (ref 0–35)
AST: 35 U/L (ref 0–37)
Anion gap: 18 — ABNORMAL HIGH (ref 5–15)
BILIRUBIN TOTAL: 0.6 mg/dL (ref 0.3–1.2)
BUN: 29 mg/dL — ABNORMAL HIGH (ref 6–23)
CO2: 23 mEq/L (ref 19–32)
CREATININE: 0.9 mg/dL (ref 0.50–1.10)
Calcium: 9.5 mg/dL (ref 8.4–10.5)
Chloride: 99 mEq/L (ref 96–112)
GFR calc non Af Amer: 72 mL/min — ABNORMAL LOW (ref >90)
GFR, EST AFRICAN AMERICAN: 83 mL/min — AB (ref >90)
Glucose, Bld: 98 mg/dL (ref 70–99)
POTASSIUM: 4.1 meq/L (ref 3.7–5.3)
Sodium: 140 mEq/L (ref 137–147)
TOTAL PROTEIN: 7.8 g/dL (ref 6.0–8.3)

## 2014-03-01 LAB — I-STAT TROPONIN, ED: TROPONIN I, POC: 0.01 ng/mL (ref 0.00–0.08)

## 2014-03-01 LAB — LIPASE, BLOOD: Lipase: 29 U/L (ref 11–59)

## 2014-03-01 MED ORDER — PANTOPRAZOLE SODIUM 40 MG PO TBEC: 40.0000 mg | DELAYED_RELEASE_TABLET | Freq: Every day | ORAL | Status: DC

## 2014-03-01 MED ORDER — GI COCKTAIL ~~LOC~~
30.0000 mL | Freq: Once | ORAL | Status: AC
Start: 2014-03-01 — End: 2014-03-01
  Administered 2014-03-01: 30 mL via ORAL
  Filled 2014-03-01: qty 30

## 2014-03-01 MED ORDER — SUCRALFATE 1 G PO TABS: 1.0000 g | ORAL_TABLET | Freq: Four times a day (QID) | ORAL | Status: DC

## 2014-03-01 MED ORDER — MORPHINE SULFATE 4 MG/ML IJ SOLN
6.0000 mg | Freq: Once | INTRAMUSCULAR | Status: AC
  Administered 2014-03-01: 6 mg via INTRAVENOUS
  Filled 2014-03-01: qty 2

## 2014-03-01 MED ORDER — ONDANSETRON 4 MG PO TBDP
4.0000 mg | ORAL_TABLET | Freq: Once | ORAL | Status: AC
  Administered 2014-03-01: 4 mg via ORAL
  Filled 2014-03-01: qty 1

## 2014-03-01 MED ORDER — METOCLOPRAMIDE HCL 5 MG/ML IJ SOLN
10.0000 mg | Freq: Once | INTRAMUSCULAR | Status: AC
  Administered 2014-03-01: 10 mg via INTRAVENOUS
  Filled 2014-03-01: qty 2

## 2014-03-01 MED ORDER — PANTOPRAZOLE SODIUM 40 MG PO TBEC
40.0000 mg | DELAYED_RELEASE_TABLET | Freq: Once | ORAL | Status: AC
  Administered 2014-03-01: 40 mg via ORAL
  Filled 2014-03-01: qty 1

## 2014-03-01 MED ORDER — SUCRALFATE 1 G PO TABS
1.0000 g | ORAL_TABLET | Freq: Once | ORAL | Status: AC
  Administered 2014-03-01: 1 g via ORAL
  Filled 2014-03-01: qty 1

## 2014-03-01 MED ORDER — HYDROMORPHONE HCL 1 MG/ML IJ SOLN
1.0000 mg | Freq: Once | INTRAMUSCULAR | Status: AC
  Administered 2014-03-01: 1 mg via INTRAVENOUS
  Filled 2014-03-01: qty 1

## 2014-03-01 NOTE — Discharge Instructions (Signed)
Abdominal Pain, Women °Abdominal (stomach, pelvic, or belly) pain can be caused by many things. It is important to tell your doctor: °· The location of the pain. °· Does it come and go or is it present all the time? °· Are there things that start the pain (eating certain foods, exercise)? °· Are there other symptoms associated with the pain (fever, nausea, vomiting, diarrhea)? °All of this is helpful to know when trying to find the cause of the pain. °CAUSES  °· Stomach: virus or bacteria infection, or ulcer. °· Intestine: appendicitis (inflamed appendix), regional ileitis (Crohn's disease), ulcerative colitis (inflamed colon), irritable bowel syndrome, diverticulitis (inflamed diverticulum of the colon), or cancer of the stomach or intestine. °· Gallbladder disease or stones in the gallbladder. °· Kidney disease, kidney stones, or infection. °· Pancreas infection or cancer. °· Fibromyalgia (pain disorder). °· Diseases of the female organs: °¨ Uterus: fibroid (non-cancerous) tumors or infection. °¨ Fallopian tubes: infection or tubal pregnancy. °¨ Ovary: cysts or tumors. °¨ Pelvic adhesions (scar tissue). °¨ Endometriosis (uterus lining tissue growing in the pelvis and on the pelvic organs). °¨ Pelvic congestion syndrome (female organs filling up with blood just before the menstrual period). °¨ Pain with the menstrual period. °¨ Pain with ovulation (producing an egg). °¨ Pain with an IUD (intrauterine device, birth control) in the uterus. °¨ Cancer of the female organs. °· Functional pain (pain not caused by a disease, may improve without treatment). °· Psychological pain. °· Depression. °DIAGNOSIS  °Your doctor will decide the seriousness of your pain by doing an examination. °· Blood tests. °· X-rays. °· Ultrasound. °· CT scan (computed tomography, special type of X-ray). °· MRI (magnetic resonance imaging). °· Cultures, for infection. °· Barium enema (dye inserted in the large intestine, to better view it with  X-rays). °· Colonoscopy (looking in intestine with a lighted tube). °· Laparoscopy (minor surgery, looking in abdomen with a lighted tube). °· Major abdominal exploratory surgery (looking in abdomen with a large incision). °TREATMENT  °The treatment will depend on the cause of the pain.  °· Many cases can be observed and treated at home. °· Over-the-counter medicines recommended by your caregiver. °· Prescription medicine. °· Antibiotics, for infection. °· Birth control pills, for painful periods or for ovulation pain. °· Hormone treatment, for endometriosis. °· Nerve blocking injections. °· Physical therapy. °· Antidepressants. °· Counseling with a psychologist or psychiatrist. °· Minor or major surgery. °HOME CARE INSTRUCTIONS  °· Do not take laxatives, unless directed by your caregiver. °· Take over-the-counter pain medicine only if ordered by your caregiver. Do not take aspirin because it can cause an upset stomach or bleeding. °· Try a clear liquid diet (broth or water) as ordered by your caregiver. Slowly move to a bland diet, as tolerated, if the pain is related to the stomach or intestine. °· Have a thermometer and take your temperature several times a day, and record it. °· Bed rest and sleep, if it helps the pain. °· Avoid sexual intercourse, if it causes pain. °· Avoid stressful situations. °· Keep your follow-up appointments and tests, as your caregiver orders. °· If the pain does not go away with medicine or surgery, you may try: °¨ Acupuncture. °¨ Relaxation exercises (yoga, meditation). °¨ Group therapy. °¨ Counseling. °SEEK MEDICAL CARE IF:  °· You notice certain foods cause stomach pain. °· Your home care treatment is not helping your pain. °· You need stronger pain medicine. °· You want your IUD removed. °· You feel faint or   lightheaded. °· You develop nausea and vomiting. °· You develop a rash. °· You are having side effects or an allergy to your medicine. °SEEK IMMEDIATE MEDICAL CARE IF:  °· Your  pain does not go away or gets worse. °· You have a fever. °· Your pain is felt only in portions of the abdomen. The right side could possibly be appendicitis. The left lower portion of the abdomen could be colitis or diverticulitis. °· You are passing blood in your stools (bright red or black tarry stools, with or without vomiting). °· You have blood in your urine. °· You develop chills, with or without a fever. °· You pass out. °MAKE SURE YOU:  °· Understand these instructions. °· Will watch your condition. °· Will get help right away if you are not doing well or get worse. °Document Released: 02/21/2007 Document Revised: 09/10/2013 Document Reviewed: 03/13/2009 °[employer]® Patient Information ©2015 [employer]. This information is not intended to replace advice given to you by your health care provider. Make sure you discuss any questions you have with your health care provider. ° °

## 2014-03-01 NOTE — ED Notes (Signed)
Pt in c/o chest pain, abd pain and vomiting that started around 8am, states she has been seen many times for the abd pain and vomiting but the chest pain is different, pt dry heaving in triage, rates pain 10/10, alert and oriented, skin warm and dry

## 2014-03-01 NOTE — ED Provider Notes (Signed)
CSN: [id number]     Arrival date & time 03/01/14  1519 History   First MD Initiated Contact with Patient 03/01/14 1537     Chief Complaint  Patient presents with  . Chest Pain  . Abdominal Pain  . Emesis     (Consider location/radiation/quality/duration/timing/severity/associated sxs/prior Treatment) Patient is a 53 y.o. female presenting with chest pain, abdominal pain, and vomiting. The history is provided by the patient.  Chest Pain Pain location:  Substernal area and epigastric Pain quality: burning   Pain radiates to:  Neck Pain radiates to the back: no   Pain severity:  Moderate Onset quality:  Gradual Timing:  Constant Progression:  Unchanged Chronicity:  New Context: at rest   Context: not breathing, not eating and no intercourse   Relieved by:  Nothing Worsened by:  Nothing tried Associated symptoms: abdominal pain, nausea and vomiting   Associated symptoms: no cough, no fever and no shortness of breath   Abdominal Pain Associated symptoms: chest pain (described as throat burning, chest tightness), diarrhea, nausea and vomiting   Associated symptoms: no chills, no cough, no fever and no shortness of breath   Emesis Associated symptoms: abdominal pain and diarrhea   Associated symptoms: no chills     Past Medical History  Diagnosis Date  . Depression   . Migraine   . Fibromyalgia   . Hearing impairment   . Arthritis   . Cardiomyopathy   . Pulmonary embolism   . Gastritis   . Osteoporosis   . Anemia   . Allergy     SEASONAL  . Anxiety   . GERD (gastroesophageal reflux disease)   . Hypertension     [name], take htn medication to regulate heart beat.  . Sleep apnea    Past Surgical History  Procedure Laterality Date  . Appendectomy    . Inner ear surgery      TUBES  . Tonsillectomy and adenoidectomy    . Tubal ligation    . Laparoscopic cholecystectomy  2012  . Cardiac surgery  January 2007    mitral valve repair  . Cardiac surgery  1973   atrial septal defect   Family History  Problem Relation Age of Onset  . Breast cancer Mother     bilateral; ages 56 and 32; TAH/BSO ~50  . Depression Sister   . Heart disease Father   . Hypertension Father    History  Substance Use Topics  . Smoking status: Never Smoker   . Smokeless tobacco: Not on file  . Alcohol Use: No   OB History   Grav Para Term Preterm Abortions TAB SAB Ect Mult Living   2 2 2       2      Review of Systems  Constitutional: Negative for fever and chills.  Respiratory: Positive for chest tightness. Negative for cough and shortness of breath.   Cardiovascular: Positive for chest pain (described as throat burning, chest tightness). Negative for leg swelling.  Gastrointestinal: Positive for nausea, vomiting, abdominal pain and diarrhea.  All other systems reviewed and are negative.     Allergies  Depakote and Valium  Home Medications   Prior to Admission medications   Medication Sig Start Date End Date Taking? Authorizing Provider  aspirin EC 81 MG tablet Take 81 mg by mouth every morning.   Yes Historical Provider, MD  BuPROPion HCl ER, XL, (FORFIVO XL) 450 MG TB24 Take 450 mg by mouth every morning. 08/22/13  Yes Norma Fredrickson, MD  butalbital-acetaminophen-caffeine (FIORICET, ESGIC) 50-325-40 MG per tablet Take 1 tablet by mouth daily as needed. For migraines 01/16/14  Yes Historical Provider, MD  Calcium Carbonate-Vitamin D (CALCIUM 600 + D PO) Take 1 tablet by mouth 2 (two) times daily.   Yes Historical Provider, MD  carvedilol (COREG) 25 MG tablet Take 1 tablet (25 mg total) by mouth 2 (two) times daily with a meal. 07/23/13  Yes Candee Furbish, MD  desvenlafaxine (PRISTIQ) 100 MG 24 hr tablet Take 1 tablet (100 mg total) by mouth daily. 08/22/13  Yes Norma Fredrickson, MD  Flaxseed, Linseed, (FLAXSEED OIL) 1000 MG CAPS Take 1,000 mg by mouth 2 (two) times daily.   Yes Historical Provider, MD  hydrochlorothiazide (MICROZIDE) 12.5 MG capsule Take 12.5 mg by  mouth every morning.    Yes Historical Provider, MD  HYDROcodone-acetaminophen (NORCO/VICODIN) 5-325 MG per tablet Take 1-2 tablets by mouth every 6 (six) hours as needed for moderate pain. 11/21/13  Yes Antonietta Breach, PA-C  loratadine (CLARITIN) 10 MG tablet Take 10 mg by mouth daily.   Yes Historical Provider, MD  meloxicam (MOBIC) 15 MG tablet Take 15 mg by mouth daily.  11/12/13  Yes Historical Provider, MD  mirabegron ER (MYRBETRIQ) 25 MG TB24 tablet Take 1 tablet (25 mg total) by mouth daily. 06/13/13  Yes Terrance Mass, MD  Multiple Vitamin (MULTIVITAMIN) tablet Take 1 tablet by mouth daily.    Yes Historical Provider, MD  ondansetron (ZOFRAN) 4 MG tablet Take 4 mg by mouth every 6 (six) hours as needed for nausea or vomiting.   Yes Historical Provider, MD  pantoprazole (PROTONIX) 40 MG tablet Take 1 tablet (40 mg total) by mouth daily. 10/29/13  Yes Milus Banister, MD  prochlorperazine (COMPAZINE) 10 MG tablet Take 10 mg by mouth every 6 (six) hours as needed for nausea or vomiting.  10/26/13  Yes Historical Provider, MD  promethazine (PHENERGAN) 25 MG suppository Place 1 suppository (25 mg total) rectally every 6 (six) hours as needed for nausea or vomiting. 12/07/13  Yes Hoyle Sauer, MD  promethazine (PHENERGAN) 25 MG tablet Take 1 tablet (25 mg total) by mouth every 6 (six) hours as needed for nausea or vomiting. 11/21/13  Yes Antonietta Breach, PA-C   BP 149/95  Pulse 90  Temp(Src) 98.5 F (36.9 C) (Oral)  Resp 20  Wt 145 lb (65.772 kg)  SpO2 100% Physical Exam  Nursing note and vitals reviewed. Constitutional: She is oriented to person, place, and time. She appears well-developed and well-nourished. No distress.  HENT:  Head: Normocephalic and atraumatic.  Mouth/Throat: Oropharynx is clear and moist.  Eyes: EOM are normal. Pupils are equal, round, and reactive to light.  Neck: Normal range of motion. Neck supple.  Cardiovascular: Normal rate and regular rhythm.  Exam reveals no friction  rub.   No murmur heard. Pulmonary/Chest: Effort normal and breath sounds normal. No respiratory distress. She has no wheezes. She has no rales.  No subcutaneous emphysema  Abdominal: Soft. She exhibits no distension. There is tenderness (epigastric). There is no rebound.  Musculoskeletal: Normal range of motion. She exhibits no edema.  Neurological: She is alert and oriented to person, place, and time. No cranial nerve deficit. She exhibits normal muscle tone. Coordination normal.  Skin: No rash noted. She is not diaphoretic.    ED Course  Procedures (including critical care time) Labs Review Labs Reviewed  CBC - Abnormal; Notable for the following:    MCH 34.7 (*)    All other  components within normal limits  COMPREHENSIVE METABOLIC PANEL  LIPASE, BLOOD  I-STAT TROPOININ, ED    Imaging Review Dg Chest 2 View  03/01/2014   CLINICAL DATA:  Chest pain.  EXAM: CHEST  2 VIEW  COMPARISON:  Sep 17, 2013.  FINDINGS: The heart size and mediastinal contours are within normal limits. Status post cardiac valve repair. No pneumothorax or pleural effusion is noted. Both lungs are clear. Old compression fracture of with upper lumbar vertebral body is noted.  IMPRESSION: No acute cardiopulmonary abnormality seen.   Electronically Signed   By: Sabino Dick M.D.   On: 03/01/2014 16:45     EKG Interpretation   Date/Time:  Friday March 01 2014 15:22:10 EDT Ventricular Rate:  103 PR Interval:  172 QRS Duration: 78 QT Interval:  358 QTC Calculation: 468 R Axis:   71 Text Interpretation:  Sinus tachycardia Otherwise normal ECG Similar to  prior Confirmed by Mingo Amber  MD, [place] ([id number]) on 03/01/2014 3:40:39 PM      MDM   Final diagnoses:  Epigastric pain    1F presents with abdominal pain, vomiting that began this morning. No sick contacts. History of similar episodes that occur every 2 weeks or so for a long time. Has seen GI and had an EGD that showed some irritation and she was put on  Protonix. Episodes of vomiting, a few spots of the diarrhea. No fevers. She describes chest pain, but describes as a tightness in mostly a burning in her esophagus. No heavy-like chest pain or no story consistent with ACS. On exam his epigastric tenderness. She does have a history of a cholecystectomy. Likely has gastro-enteritis versus other GI illness. We'll give GI cocktail, Protonix, Carafate, morphine. We'll give fluids and check labs. Remainder of abdominal exam is normal, do not feel she needs a CT scan at this time. Stable for discharge. Feeling better after pain meds.   Evelina Bucy, MD 03/02/14 [phone]

## 2014-03-01 NOTE — ED Notes (Signed)
Pt has returned from being out of the department; placed pt back on monitor, continuous pulse oximetry and blood pressure cuff

## 2014-03-05 ENCOUNTER — Telehealth: Payer: Self-pay | Admitting: Cardiology

## 2014-03-05 NOTE — Telephone Encounter (Signed)
Pt has appt 11/30 with Dr Marlou Porch.  Recently in the hospital and had CMP and CBC.  Will ask him if she needs other lab and if so, what.

## 2014-03-05 NOTE — Telephone Encounter (Signed)
New message    Patient would like to be set up for lab work prior to appt in nov.

## 2014-03-05 NOTE — Telephone Encounter (Signed)
Pt awre no other lab work needed.

## 2014-03-05 NOTE — Telephone Encounter (Signed)
No other lab work needed.

## 2014-03-08 ENCOUNTER — Emergency Department (HOSPITAL_BASED_OUTPATIENT_CLINIC_OR_DEPARTMENT_OTHER)
Admission: EM | Admit: 2014-03-08 | Discharge: 2014-03-08 | Disposition: A | Discharge status: Home / Self Care | Payer: Medicare Other | Attending: Emergency Medicine | Admitting: Emergency Medicine

## 2014-03-08 ENCOUNTER — Encounter (HOSPITAL_BASED_OUTPATIENT_CLINIC_OR_DEPARTMENT_OTHER): Payer: Self-pay | Admitting: Emergency Medicine

## 2014-03-08 DIAGNOSIS — F419 Anxiety disorder, unspecified: Secondary | ICD-10-CM | POA: Insufficient documentation

## 2014-03-08 DIAGNOSIS — M1990 Unspecified osteoarthritis, unspecified site: Secondary | ICD-10-CM | POA: Insufficient documentation

## 2014-03-08 DIAGNOSIS — G43909 Migraine, unspecified, not intractable, without status migrainosus: Secondary | ICD-10-CM | POA: Insufficient documentation

## 2014-03-08 DIAGNOSIS — Z79899 Other long term (current) drug therapy: Secondary | ICD-10-CM | POA: Diagnosis not present

## 2014-03-08 DIAGNOSIS — R6883 Chills (without fever): Secondary | ICD-10-CM | POA: Diagnosis not present

## 2014-03-08 DIAGNOSIS — E860 Dehydration: Secondary | ICD-10-CM | POA: Diagnosis not present

## 2014-03-08 DIAGNOSIS — Z862 Personal history of diseases of the blood and blood-forming organs and certain disorders involving the immune mechanism: Secondary | ICD-10-CM | POA: Insufficient documentation

## 2014-03-08 DIAGNOSIS — Z8669 Personal history of other diseases of the nervous system and sense organs: Secondary | ICD-10-CM | POA: Diagnosis not present

## 2014-03-08 DIAGNOSIS — R079 Chest pain, unspecified: Secondary | ICD-10-CM | POA: Diagnosis not present

## 2014-03-08 DIAGNOSIS — Z9889 Other specified postprocedural states: Secondary | ICD-10-CM | POA: Insufficient documentation

## 2014-03-08 DIAGNOSIS — Z9049 Acquired absence of other specified parts of digestive tract: Secondary | ICD-10-CM | POA: Insufficient documentation

## 2014-03-08 DIAGNOSIS — Z791 Long term (current) use of non-steroidal anti-inflammatories (NSAID): Secondary | ICD-10-CM | POA: Diagnosis not present

## 2014-03-08 DIAGNOSIS — Z7982 Long term (current) use of aspirin: Secondary | ICD-10-CM | POA: Diagnosis not present

## 2014-03-08 DIAGNOSIS — I10 Essential (primary) hypertension: Secondary | ICD-10-CM | POA: Diagnosis not present

## 2014-03-08 DIAGNOSIS — K219 Gastro-esophageal reflux disease without esophagitis: Secondary | ICD-10-CM | POA: Insufficient documentation

## 2014-03-08 DIAGNOSIS — R112 Nausea with vomiting, unspecified: Secondary | ICD-10-CM | POA: Diagnosis not present

## 2014-03-08 DIAGNOSIS — R197 Diarrhea, unspecified: Secondary | ICD-10-CM | POA: Insufficient documentation

## 2014-03-08 DIAGNOSIS — F329 Major depressive disorder, single episode, unspecified: Secondary | ICD-10-CM | POA: Insufficient documentation

## 2014-03-08 DIAGNOSIS — M810 Age-related osteoporosis without current pathological fracture: Secondary | ICD-10-CM | POA: Insufficient documentation

## 2014-03-08 DIAGNOSIS — R1013 Epigastric pain: Secondary | ICD-10-CM | POA: Diagnosis not present

## 2014-03-08 DIAGNOSIS — M797 Fibromyalgia: Secondary | ICD-10-CM | POA: Insufficient documentation

## 2014-03-08 DIAGNOSIS — Z86711 Personal history of pulmonary embolism: Secondary | ICD-10-CM | POA: Insufficient documentation

## 2014-03-08 DIAGNOSIS — R002 Palpitations: Secondary | ICD-10-CM

## 2014-03-08 DIAGNOSIS — Z79891 Long term (current) use of opiate analgesic: Secondary | ICD-10-CM | POA: Diagnosis not present

## 2014-03-08 DIAGNOSIS — R1110 Vomiting, unspecified: Secondary | ICD-10-CM

## 2014-03-08 LAB — CBC WITH DIFFERENTIAL/PLATELET
BASOS ABS: 0 10*3/uL (ref 0.0–0.1)
BASOS PCT: 1 % (ref 0–1)
EOS PCT: 2 % (ref 0–5)
Eosinophils Absolute: 0.1 10*3/uL (ref 0.0–0.7)
HEMATOCRIT: 42.3 % (ref 36.0–46.0)
Hemoglobin: 14.8 g/dL (ref 12.0–15.0)
Lymphocytes Relative: 13 % (ref 12–46)
Lymphs Abs: 0.6 10*3/uL — ABNORMAL LOW (ref 0.7–4.0)
MCH: 34.1 pg — ABNORMAL HIGH (ref 26.0–34.0)
MCHC: 35 g/dL (ref 30.0–36.0)
MCV: 97.5 fL (ref 78.0–100.0)
MONO ABS: 0.4 10*3/uL (ref 0.1–1.0)
Monocytes Relative: 10 % (ref 3–12)
Neutro Abs: 3.2 10*3/uL (ref 1.7–7.7)
Neutrophils Relative %: 74 % (ref 43–77)
Platelets: 191 10*3/uL (ref 150–400)
RBC: 4.34 MIL/uL (ref 3.87–5.11)
RDW: 12.6 % (ref 11.5–15.5)
WBC: 4.4 10*3/uL (ref 4.0–10.5)

## 2014-03-08 LAB — URINALYSIS, ROUTINE W REFLEX MICROSCOPIC
BILIRUBIN URINE: NEGATIVE
GLUCOSE, UA: NEGATIVE mg/dL
KETONES UR: NEGATIVE mg/dL
Leukocytes, UA: NEGATIVE
Nitrite: NEGATIVE
Protein, ur: NEGATIVE mg/dL
Specific Gravity, Urine: 1.022 (ref 1.005–1.030)
UROBILINOGEN UA: 0.2 mg/dL (ref 0.0–1.0)
pH: 5.5 (ref 5.0–8.0)

## 2014-03-08 LAB — COMPREHENSIVE METABOLIC PANEL
ALBUMIN: 4.2 g/dL (ref 3.5–5.2)
ALK PHOS: 65 U/L (ref 39–117)
ALT: 61 U/L — ABNORMAL HIGH (ref 0–35)
ANION GAP: 18 — AB (ref 5–15)
AST: 144 U/L — AB (ref 0–37)
BUN: 23 mg/dL (ref 6–23)
CHLORIDE: 98 meq/L (ref 96–112)
CO2: 23 mEq/L (ref 19–32)
Calcium: 9.5 mg/dL (ref 8.4–10.5)
Creatinine, Ser: 0.8 mg/dL (ref 0.50–1.10)
GFR calc Af Amer: >90 mL/min (ref >90)
GFR, EST NON AFRICAN AMERICAN: 83 mL/min — AB (ref >90)
Glucose, Bld: 114 mg/dL — ABNORMAL HIGH (ref 70–99)
POTASSIUM: 3.9 meq/L (ref 3.7–5.3)
Sodium: 139 mEq/L (ref 137–147)
Total Bilirubin: 0.6 mg/dL (ref 0.3–1.2)
Total Protein: 7.3 g/dL (ref 6.0–8.3)

## 2014-03-08 LAB — URINE MICROSCOPIC-ADD ON

## 2014-03-08 LAB — TROPONIN I: Troponin I: <0.3 ng/mL (ref <0.30)

## 2014-03-08 LAB — LIPASE, BLOOD: Lipase: 42 U/L (ref 11–59)

## 2014-03-08 LAB — I-STAT CG4 LACTIC ACID, ED: Lactic Acid, Venous: 2.45 mmol/L — ABNORMAL HIGH (ref 0.5–2.2)

## 2014-03-08 MED ORDER — OXYCODONE-ACETAMINOPHEN 5-325 MG PO TABS
1.0000 | ORAL_TABLET | Freq: Once | ORAL | Status: AC
  Administered 2014-03-08: 1 via ORAL
  Filled 2014-03-08: qty 1

## 2014-03-08 MED ORDER — SODIUM CHLORIDE 0.9 % IV BOLUS (SEPSIS)
1000.0000 mL | Freq: Once | INTRAVENOUS | Status: AC
  Administered 2014-03-08: 1000 mL via INTRAVENOUS

## 2014-03-08 MED ORDER — ONDANSETRON HCL 4 MG/2ML IJ SOLN
4.0000 mg | Freq: Once | INTRAMUSCULAR | Status: AC
  Administered 2014-03-08: 4 mg via INTRAVENOUS
  Filled 2014-03-08: qty 2

## 2014-03-08 MED ORDER — ONDANSETRON HCL 4 MG/2ML IJ SOLN
4.0000 mg | Freq: Once | INTRAMUSCULAR | Status: AC
Start: 2014-03-08 — End: 2014-03-08
  Administered 2014-03-08: 4 mg via INTRAVENOUS
  Filled 2014-03-08: qty 2

## 2014-03-08 MED ORDER — HYDROMORPHONE HCL 1 MG/ML IJ SOLN
1.0000 mg | Freq: Once | INTRAMUSCULAR | Status: AC
  Administered 2014-03-08: 1 mg via INTRAVENOUS
  Filled 2014-03-08: qty 1

## 2014-03-08 NOTE — ED Notes (Signed)
Onset this morning with heart palpitation, abdominal pain, dizziness and vomiting. "feel like the palpitation is coming out of her throat". Took some Tums this morning and thought it did helped some. Similar episode 6 days ago. NAD noted. EKG Done. ABC intact.

## 2014-03-08 NOTE — ED Notes (Signed)
Critical Value Lactic Acid 2.45 reported to Dr. Christy Gentles.

## 2014-03-08 NOTE — ED Provider Notes (Signed)
CSN: [id number]     Arrival date & time 03/08/14  5885 History   First MD Initiated Contact with Patient 03/08/14 [phone]     Chief Complaint  Patient presents with  . Palpitations     Patient is a 53 y.o. female presenting with palpitations. The history is provided by the patient.  Palpitations Palpitations quality:  Regular Onset quality:  Sudden Timing:  Constant Progression:  Unchanged Chronicity:  New Relieved by:  None tried Worsened by:  Nothing tried Associated symptoms: chest pain, nausea and vomiting   Associated symptoms: no cough   Patient present for multiple complaints:  She reports waking up this morning with nausea/vomiting/epigastric pain, and after vomiting she had episode of CP/heartburn that responded to tums She also reports heart racing/palpitations.   No syncope She reports continued abdominal pain/vomiting/diarrhea - nonbloody stool/vomitus  Past Medical History  Diagnosis Date  . Depression   . Migraine   . Fibromyalgia   . Hearing impairment   . Arthritis   . Cardiomyopathy   . Pulmonary embolism   . Gastritis   . Osteoporosis   . Anemia   . Allergy     SEASONAL  . Anxiety   . GERD (gastroesophageal reflux disease)   . Hypertension     [name], take htn medication to regulate heart beat.  . Sleep apnea    Past Surgical History  Procedure Laterality Date  . Appendectomy    . Inner ear surgery      TUBES  . Tonsillectomy and adenoidectomy    . Tubal ligation    . Laparoscopic cholecystectomy  2012  . Cardiac surgery  January 2007    mitral valve repair  . Cardiac surgery  1973    atrial septal defect   Family History  Problem Relation Age of Onset  . Breast cancer Mother     bilateral; ages 26 and 65; TAH/BSO ~50  . Depression Sister   . Heart disease Father   . Hypertension Father    History  Substance Use Topics  . Smoking status: Never Smoker   . Smokeless tobacco: Not on file  . Alcohol Use: No   OB History   Grav Para  Term Preterm Abortions TAB SAB Ect Mult Living   2 2 2       2      Review of Systems  Constitutional: Positive for chills.  Respiratory: Negative for cough.   Cardiovascular: Positive for chest pain and palpitations.  Gastrointestinal: Positive for nausea and vomiting. Negative for blood in stool.  Neurological: Negative for syncope.  All other systems reviewed and are negative.     Allergies  Depakote and Valium  Home Medications   Prior to Admission medications   Medication Sig Start Date End Date Taking? Authorizing Provider  aspirin EC 81 MG tablet Take 81 mg by mouth every morning.    Historical Provider, MD  BuPROPion HCl ER, XL, (FORFIVO XL) 450 MG TB24 Take 450 mg by mouth every morning. 08/22/13   Norma Fredrickson, MD  butalbital-acetaminophen-caffeine (FIORICET, ESGIC) [phone] MG per tablet Take 1 tablet by mouth daily as needed. For migraines 01/16/14   Historical Provider, MD  Calcium Carbonate-Vitamin D (CALCIUM 600 + D PO) Take 1 tablet by mouth 2 (two) times daily.    Historical Provider, MD  carvedilol (COREG) 25 MG tablet Take 1 tablet (25 mg total) by mouth 2 (two) times daily with a meal. 07/23/13   Candee Furbish, MD  desvenlafaxine (PRISTIQ) 100  MG 24 hr tablet Take 1 tablet (100 mg total) by mouth daily. 08/22/13   Norma Fredrickson, MD  Flaxseed, Linseed, (FLAXSEED OIL) 1000 MG CAPS Take 1,000 mg by mouth 2 (two) times daily.    Historical Provider, MD  hydrochlorothiazide (MICROZIDE) 12.5 MG capsule Take 12.5 mg by mouth every morning.     Historical Provider, MD  HYDROcodone-acetaminophen (NORCO/VICODIN) 5-325 MG per tablet Take 1-2 tablets by mouth every 6 (six) hours as needed for moderate pain. 11/21/13   Antonietta Breach, PA-C  loratadine (CLARITIN) 10 MG tablet Take 10 mg by mouth daily.    Historical Provider, MD  meloxicam (MOBIC) 15 MG tablet Take 15 mg by mouth daily.  11/12/13   Historical Provider, MD  mirabegron ER (MYRBETRIQ) 25 MG TB24 tablet Take 1 tablet (25 mg  total) by mouth daily. 06/13/13   Terrance Mass, MD  Multiple Vitamin (MULTIVITAMIN) tablet Take 1 tablet by mouth daily.     Historical Provider, MD  ondansetron (ZOFRAN) 4 MG tablet Take 4 mg by mouth every 6 (six) hours as needed for nausea or vomiting.    Historical Provider, MD  pantoprazole (PROTONIX) 40 MG tablet Take 1 tablet (40 mg total) by mouth daily. 10/29/13   Milus Banister, MD  pantoprazole (PROTONIX) 40 MG tablet Take 1 tablet (40 mg total) by mouth daily. 03/01/14   Evelina Bucy, MD  prochlorperazine (COMPAZINE) 10 MG tablet Take 10 mg by mouth every 6 (six) hours as needed for nausea or vomiting.  10/26/13   Historical Provider, MD  promethazine (PHENERGAN) 25 MG suppository Place 1 suppository (25 mg total) rectally every 6 (six) hours as needed for nausea or vomiting. 12/07/13   Hoyle Sauer, MD  promethazine (PHENERGAN) 25 MG tablet Take 1 tablet (25 mg total) by mouth every 6 (six) hours as needed for nausea or vomiting. 11/21/13   Antonietta Breach, PA-C  sucralfate (CARAFATE) 1 G tablet Take 1 tablet (1 g total) by mouth 4 (four) times daily. 03/01/14   Evelina Bucy, MD   BP 142/97  Pulse 107  Temp(Src) 98.4 F (36.9 C) (Oral)  Resp 15  SpO2 100% Physical Exam CONSTITUTIONAL: Well developed/well nourished HEAD: Normocephalic/atraumatic EYES: EOMI/PERRL, no icterus ENMT: Mucous membranes moist NECK: supple no meningeal signs SPINE:entire spine nontender CV: S1/S2 noted, no murmurs/rubs/gallops noted LUNGS: Lungs are clear to auscultation bilaterally, no apparent distress ABDOMEN: soft, nontender, no rebound or guarding GU:no cva tenderness NEURO: Pt is awake/alert, moves all extremitiesx4 EXTREMITIES: pulses normal, full ROM SKIN: warm, color normal PSYCH: no abnormalities of mood noted  ED Course  Procedures  10:50 AM Pt admits abd pain/vomiting/diarrhea similar to prior episodes She reports palpitations are new - EKG shows apparent sinus tach (it is regular,  suspect p waves buried in t waves) Will rehydrate and reasses Her abdomen is nontender, doubt acute abd process.  I doubt ACS/PE at this time  Pt monitored for several hrs She appears improved She admits abd pain is chronic, reports having followup next week For her palpitations, she reports she is supposed to be taking coreg, advised to start this at home   [place] - Abnormal; Notable for the following:    Glucose, Bld 114 (*)    AST 144 (*)    ALT 61 (*)    GFR calc non Af Amer 83 (*)    Anion gap 18 (*)    All other components within normal limits  CBC  WITH DIFFERENTIAL - Abnormal; Notable for the following:    MCH 34.1 (*)    Lymphs Abs 0.6 (*)    All other components within normal limits  URINALYSIS, ROUTINE W REFLEX MICROSCOPIC - Abnormal; Notable for the following:    Hgb urine dipstick SMALL (*)    All other components within normal limits  URINE MICROSCOPIC-ADD ON - Abnormal; Notable for the following:    Squamous Epithelial / LPF FEW (*)    Bacteria, UA MANY (*)    Casts GRANULAR CAST (*)    All other components within normal limits  I-STAT CG4 LACTIC ACID, ED - Abnormal; Notable for the following:    Lactic Acid, Venous 2.45 (*)    All other components within normal limits  LIPASE, BLOOD  TROPONIN I  LACTIC ACID, PLASMA     EKG Interpretation   Date/Time:  Friday March 08 2014 10:19:04 EDT Ventricular Rate:  97 PR Interval:  402 QRS Duration: 74 QT Interval:  188 QTC Calculation: 238 R Axis:   84 Text Interpretation:  Sinus rhythm with 1st degree A-V block Nonspecific  ST and T wave abnormality Abnormal ECG Confirmed by Christy Gentles  MD, Elenore Rota  [phone]) on 03/08/2014 10:48:20 AM      MDM   Final diagnoses:  Palpitations  Epigastric abdominal pain  Dehydration  Vomiting and diarrhea    Nursing notes including past medical history and social history reviewed and considered in  documentation Labs/vital reviewed and considered     Sharyon Cable, MD 03/08/14 1452

## 2014-03-11 ENCOUNTER — Other Ambulatory Visit (INDEPENDENT_AMBULATORY_CARE_PROVIDER_SITE_OTHER): Payer: Medicare Other

## 2014-03-11 ENCOUNTER — Ambulatory Visit (INDEPENDENT_AMBULATORY_CARE_PROVIDER_SITE_OTHER): Payer: Medicare Other | Admitting: Gastroenterology

## 2014-03-11 ENCOUNTER — Encounter: Payer: Self-pay | Admitting: Gastroenterology

## 2014-03-11 VITALS — BP 130/70 | HR 74 | Ht 65.0 in | Wt 147.4 lb

## 2014-03-11 DIAGNOSIS — R1314 Dysphagia, pharyngoesophageal phase: Secondary | ICD-10-CM | POA: Diagnosis not present

## 2014-03-11 DIAGNOSIS — R109 Unspecified abdominal pain: Secondary | ICD-10-CM | POA: Diagnosis not present

## 2014-03-11 LAB — COMPREHENSIVE METABOLIC PANEL
ALK PHOS: 52 U/L (ref 39–117)
ALT: 38 U/L — AB (ref 0–35)
AST: 38 U/L — AB (ref 0–37)
Albumin: 3.5 g/dL (ref 3.5–5.2)
BILIRUBIN TOTAL: 1.1 mg/dL (ref 0.2–1.2)
BUN: 12 mg/dL (ref 6–23)
CO2: 27 meq/L (ref 19–32)
CREATININE: 0.9 mg/dL (ref 0.4–1.2)
Calcium: 9.4 mg/dL (ref 8.4–10.5)
Chloride: 101 mEq/L (ref 96–112)
GFR: 72.27 mL/min (ref >60.00)
Glucose, Bld: 103 mg/dL — ABNORMAL HIGH (ref 70–99)
Potassium: 3.9 mEq/L (ref 3.5–5.1)
Sodium: 136 mEq/L (ref 135–145)
Total Protein: 7.1 g/dL (ref 6.0–8.3)

## 2014-03-11 NOTE — Progress Notes (Signed)
Review of pertinent gastrointestinal problems: 1. Routine risk for colon cancer: colonsocopy Dr. Ardis Hughs 2009 was normal.  Repeat colonoscopy Dr. Ardis Hughs 10/2013 normal except single diminutive polyp that was removed, not retrieved, recommended recall in 10 years. 2. Mild gastritis, EGD Dr. Ardis Hughs 10/2013; biopsies show reactive gastropathy, no H pylori. Also manilia esophagititis, treated with diflucan empirically.    HPI: This is a   Very pleasant 53 year old woman whom I last saw over 6 months ago   Labs 02/2014 AST 144, ALT 60s, CBC normal, lipase normal, troponin normal.  Has been having intermittent abd pain, vomiting, diarrhea.  WAs just in ER last weekend with the same.  Epigastric pain.  Takes fiorcet, not daily.  Overall stable weight.  Does not drink Etoh.  Feels she is still having swallowing trouble (in her teens, 20s she had dysphagia, food impactions).  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   Still has sore throat.  Had lap chole 4 years ago.   Past Medical History  Diagnosis Date  . Depression   . Migraine   . Fibromyalgia   . Hearing impairment   . Arthritis   . Cardiomyopathy   . Pulmonary embolism   . Gastritis   . Osteoporosis   . Anemia   . Allergy     SEASONAL  . Anxiety   . GERD (gastroesophageal reflux disease)   . Hypertension     [name], take htn medication to regulate heart beat.  . Sleep apnea     Past Surgical History  Procedure Laterality Date  . Appendectomy    . Inner ear surgery      TUBES  . Tonsillectomy and adenoidectomy    . Tubal ligation    . Laparoscopic cholecystectomy  2012  . Cardiac surgery  January 2007    mitral valve repair  . Cardiac surgery  1973    atrial septal defect    Current Outpatient Prescriptions  Medication Sig Dispense Refill  . aspirin EC 81 MG tablet Take 81 mg by mouth every morning.    . BuPROPion HCl ER, XL, (FORFIVO XL) 450 MG TB24  Take 450 mg by mouth every morning. 90 tablet 1  . butalbital-acetaminophen-caffeine (FIORICET, ESGIC) 50-325-40 MG per tablet Take 1 tablet by mouth daily as needed. For migraines    . Calcium Carbonate-Vitamin D (CALCIUM 600 + D PO) Take 1 tablet by mouth 2 (two) times daily.    . carvedilol (COREG) 25 MG tablet Take 1 tablet (25 mg total) by mouth 2 (two) times daily with a meal. 60 tablet 3  . desvenlafaxine (PRISTIQ) 100 MG 24 hr tablet Take 1 tablet (100 mg total) by mouth daily. 90 tablet 1  . Flaxseed, Linseed, (FLAXSEED OIL) 1000 MG CAPS Take 1,000 mg by mouth 2 (two) times daily.    . hydrochlorothiazide (MICROZIDE) 12.5 MG capsule Take 12.5 mg by mouth every morning.     [name] HYDROcodone-acetaminophen (NORCO/VICODIN) 5-325 MG per tablet Take 1-2 tablets by mouth every 6 (six) hours as needed for moderate pain. 15 tablet 0  . loratadine (CLARITIN) 10 MG tablet Take 10 mg by mouth daily.    . meloxicam (MOBIC) 15 MG tablet Take 15 mg by mouth daily.     . mirabegron ER (MYRBETRIQ) 25 MG TB24 tablet Take 1 tablet (25 mg total) by mouth daily. 30 tablet 11  . Multiple Vitamin (MULTIVITAMIN) tablet Take 1 tablet by  mouth daily.     . ondansetron (ZOFRAN) 4 MG tablet Take 4 mg by mouth every 6 (six) hours as needed for nausea or vomiting.    . pantoprazole (PROTONIX) 40 MG tablet Take 1 tablet (40 mg total) by mouth daily. 90 tablet 3  . pantoprazole (PROTONIX) 40 MG tablet Take 1 tablet (40 mg total) by mouth daily. 21 tablet 0  . prochlorperazine (COMPAZINE) 10 MG tablet Take 10 mg by mouth every 6 (six) hours as needed for nausea or vomiting.     . promethazine (PHENERGAN) 25 MG tablet Take 1 tablet (25 mg total) by mouth every 6 (six) hours as needed for nausea or vomiting. 20 tablet 0   No current facility-administered medications for this visit.    Allergies as of 03/11/2014 - Review Complete 03/11/2014  Allergen Reaction Noted  . Depakote [divalproex sodium] Anaphylaxis 07/01/2012  .  Valium Anaphylaxis 09/23/2010    Family History  Problem Relation Age of Onset  . Breast cancer Mother     bilateral; ages 79 and 49; TAH/BSO ~50  . Depression Sister   . Heart disease Father   . Hypertension Father     History   Social History  . Marital Status: Divorced    Spouse Name: N/A    Number of Children: N/A  . Years of Education: N/A   Occupational History  . Not on file.   Social History Main Topics  . Smoking status: Never Smoker   . Smokeless tobacco: Not on file  . Alcohol Use: No  . Drug Use: No  . Sexual Activity: No   Other Topics Concern  . Not on file   Social History Narrative      Physical Exam: BP 130/70 mmHg  Pulse 74  Ht 5\' 5"  (1.651 m)  Wt 147 lb 6 oz (66.849 kg)  BMI 24.52 kg/m2 Constitutional: generally well-appearing Psychiatric: alert and oriented x3 Abdomen: soft, nontender, nondistended, no obvious ascites, no peritoneal signs, normal bowel sounds     Assessment and plan: 53 y.o. female with likely 2 distinct issues. 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 I would like to begin workup of this withi barium esophagram.  It seems unlikely that she would've presented with achalasia at such young age. Her intermittent abdominal pains vomiting episodes are of unclear etiology also. Most recently last week her liver tests were found to be elevated during one of these episodes. That may have been reactive, it may also be a sign that she has underlying residual stone disease. She will have an MRI with MRCP images and a repeat complete metabolic profile.

## 2014-03-11 NOTE — Patient Instructions (Addendum)
MRCP with MRI images for intermittent abd pains, h/o GB removal 4 years ago, check for retained stones.  You have been scheduled for an MRI at [facility] on 03/20/14 . Your appointment time is 9 am Please arrive 15 minutes prior to your appointment time for registration purposes. Please make certain not to have anything to eat or drink 6 hours prior to your test. In addition, if you have any metal in your body, have a pacemaker or defibrillator, please be sure to let your ordering physician know. This test typically takes 45 minutes to 1 hour to complete. Barium esophagram for h/o swallowing trouble. You have been scheduled for a Barium Esophogram at [address] [facility] (1st floor of the hospital) to follow your MRI.  A barium swallow is an examination that concentrates on views of the esophagus. This tends to be a double contrast exam (barium and two liquids which, when combined, create a gas to distend the wall of the oesophagus) or single contrast (non-ionic iodine based). The study is usually tailored to your symptoms so a good history is essential. Attention is paid during the study to the form, structure and configuration of the esophagus, looking for functional disorders (such as aspiration, dysphagia, achalasia, motility and reflux) EXAMINATION You may be asked to change into a gown, depending on the type of swallow being performed. A radiologist and radiographer will perform the procedure. The radiologist will advise you of the type of contrast selected for your procedure and direct you during the exam. You will be asked to stand, sit or lie in several different positions and to hold a small amount of fluid in your mouth before being asked to swallow while the imaging is performed .In some instances you may be asked to swallow barium coated marshmallows to assess the motility of a solid food bolus. The exam can be recorded as a digital or video fluoroscopy procedure. POST PROCEDURE It will take  1-2 days for the barium to pass through your system. To facilitate this, it is important, unless otherwise directed, to increase your fluids for the next 24-48hrs and to resume your normal diet.  This test typically takes about 30 minutes to perform. You will have labs checked today in the basement lab.  Please head down after you check out with the front desk  (cmet). __________________________________________________________________________________

## 2014-03-13 ENCOUNTER — Telehealth: Payer: Self-pay | Admitting: Gastroenterology

## 2014-03-14 NOTE — Telephone Encounter (Signed)
The patient has been notified of this information and all questions answered.

## 2014-03-16 ENCOUNTER — Emergency Department (HOSPITAL_COMMUNITY)
Admission: EM | Admit: 2014-03-16 | Discharge: 2014-03-16 | Disposition: A | Discharge status: Home / Self Care | Payer: Medicare Other | Attending: Emergency Medicine | Admitting: Emergency Medicine

## 2014-03-16 ENCOUNTER — Encounter (HOSPITAL_COMMUNITY): Payer: Self-pay | Admitting: *Deleted

## 2014-03-16 DIAGNOSIS — R55 Syncope and collapse: Secondary | ICD-10-CM

## 2014-03-16 DIAGNOSIS — M1990 Unspecified osteoarthritis, unspecified site: Secondary | ICD-10-CM | POA: Diagnosis not present

## 2014-03-16 DIAGNOSIS — M549 Dorsalgia, unspecified: Secondary | ICD-10-CM | POA: Diagnosis not present

## 2014-03-16 DIAGNOSIS — G43909 Migraine, unspecified, not intractable, without status migrainosus: Secondary | ICD-10-CM | POA: Diagnosis not present

## 2014-03-16 DIAGNOSIS — Z862 Personal history of diseases of the blood and blood-forming organs and certain disorders involving the immune mechanism: Secondary | ICD-10-CM | POA: Insufficient documentation

## 2014-03-16 DIAGNOSIS — K219 Gastro-esophageal reflux disease without esophagitis: Secondary | ICD-10-CM | POA: Diagnosis not present

## 2014-03-16 DIAGNOSIS — R1013 Epigastric pain: Secondary | ICD-10-CM | POA: Diagnosis not present

## 2014-03-16 DIAGNOSIS — Z79899 Other long term (current) drug therapy: Secondary | ICD-10-CM | POA: Insufficient documentation

## 2014-03-16 DIAGNOSIS — R079 Chest pain, unspecified: Secondary | ICD-10-CM | POA: Diagnosis not present

## 2014-03-16 DIAGNOSIS — Z7982 Long term (current) use of aspirin: Secondary | ICD-10-CM | POA: Diagnosis not present

## 2014-03-16 DIAGNOSIS — Z791 Long term (current) use of non-steroidal anti-inflammatories (NSAID): Secondary | ICD-10-CM | POA: Insufficient documentation

## 2014-03-16 DIAGNOSIS — F329 Major depressive disorder, single episode, unspecified: Secondary | ICD-10-CM | POA: Insufficient documentation

## 2014-03-16 DIAGNOSIS — F419 Anxiety disorder, unspecified: Secondary | ICD-10-CM | POA: Insufficient documentation

## 2014-03-16 DIAGNOSIS — R0789 Other chest pain: Secondary | ICD-10-CM | POA: Diagnosis not present

## 2014-03-16 DIAGNOSIS — H9190 Unspecified hearing loss, unspecified ear: Secondary | ICD-10-CM | POA: Diagnosis not present

## 2014-03-16 DIAGNOSIS — I10 Essential (primary) hypertension: Secondary | ICD-10-CM | POA: Diagnosis not present

## 2014-03-16 DIAGNOSIS — R002 Palpitations: Secondary | ICD-10-CM | POA: Insufficient documentation

## 2014-03-16 DIAGNOSIS — R112 Nausea with vomiting, unspecified: Secondary | ICD-10-CM | POA: Insufficient documentation

## 2014-03-16 DIAGNOSIS — R197 Diarrhea, unspecified: Secondary | ICD-10-CM | POA: Insufficient documentation

## 2014-03-16 DIAGNOSIS — Z9889 Other specified postprocedural states: Secondary | ICD-10-CM | POA: Insufficient documentation

## 2014-03-16 DIAGNOSIS — Z9089 Acquired absence of other organs: Secondary | ICD-10-CM | POA: Diagnosis not present

## 2014-03-16 DIAGNOSIS — I959 Hypotension, unspecified: Secondary | ICD-10-CM | POA: Diagnosis not present

## 2014-03-16 DIAGNOSIS — Z86711 Personal history of pulmonary embolism: Secondary | ICD-10-CM | POA: Insufficient documentation

## 2014-03-16 DIAGNOSIS — K2970 Gastritis, unspecified, without bleeding: Secondary | ICD-10-CM | POA: Diagnosis not present

## 2014-03-16 LAB — COMPREHENSIVE METABOLIC PANEL
ALT: 19 U/L (ref 0–35)
AST: 33 U/L (ref 0–37)
Albumin: 3.6 g/dL (ref 3.5–5.2)
Alkaline Phosphatase: 48 U/L (ref 39–117)
Anion gap: 18 — ABNORMAL HIGH (ref 5–15)
BUN: 29 mg/dL — ABNORMAL HIGH (ref 6–23)
CO2: 21 mEq/L (ref 19–32)
Calcium: 8.9 mg/dL (ref 8.4–10.5)
Chloride: 97 mEq/L (ref 96–112)
Creatinine, Ser: 0.85 mg/dL (ref 0.50–1.10)
GFR calc Af Amer: 89 mL/min — ABNORMAL LOW (ref >90)
GFR calc non Af Amer: 77 mL/min — ABNORMAL LOW (ref >90)
Glucose, Bld: 65 mg/dL — ABNORMAL LOW (ref 70–99)
Potassium: 4.3 mEq/L (ref 3.7–5.3)
Sodium: 136 mEq/L — ABNORMAL LOW (ref 137–147)
Total Bilirubin: 0.7 mg/dL (ref 0.3–1.2)
Total Protein: 6.8 g/dL (ref 6.0–8.3)

## 2014-03-16 LAB — CBC WITH DIFFERENTIAL/PLATELET
Basophils Absolute: 0 10*3/uL (ref 0.0–0.1)
Basophils Relative: 0 % (ref 0–1)
Eosinophils Absolute: 0 10*3/uL (ref 0.0–0.7)
Eosinophils Relative: 0 % (ref 0–5)
HCT: 40.3 % (ref 36.0–46.0)
Hemoglobin: 14 g/dL (ref 12.0–15.0)
Lymphocytes Relative: 9 % — ABNORMAL LOW (ref 12–46)
Lymphs Abs: 0.7 10*3/uL (ref 0.7–4.0)
MCH: 33.8 pg (ref 26.0–34.0)
MCHC: 34.7 g/dL (ref 30.0–36.0)
MCV: 97.3 fL (ref 78.0–100.0)
Monocytes Absolute: 0.2 10*3/uL (ref 0.1–1.0)
Monocytes Relative: 3 % (ref 3–12)
Neutro Abs: 6.9 10*3/uL (ref 1.7–7.7)
Neutrophils Relative %: 88 % — ABNORMAL HIGH (ref 43–77)
Platelets: 245 10*3/uL (ref 150–400)
RBC: 4.14 MIL/uL (ref 3.87–5.11)
RDW: 12.8 % (ref 11.5–15.5)
WBC: 7.8 10*3/uL (ref 4.0–10.5)

## 2014-03-16 LAB — LIPASE, BLOOD: Lipase: 30 U/L (ref 11–59)

## 2014-03-16 LAB — I-STAT TROPONIN, ED: Troponin i, poc: 0 ng/mL (ref 0.00–0.08)

## 2014-03-16 MED ORDER — GI COCKTAIL ~~LOC~~
30.0000 mL | Freq: Once | ORAL | Status: AC
  Administered 2014-03-16: 30 mL via ORAL
  Filled 2014-03-16: qty 30

## 2014-03-16 MED ORDER — HYDROMORPHONE HCL 1 MG/ML IJ SOLN
1.0000 mg | Freq: Once | INTRAMUSCULAR | Status: AC
  Administered 2014-03-16: 1 mg via INTRAVENOUS
  Filled 2014-03-16: qty 1

## 2014-03-16 MED ORDER — PROMETHAZINE HCL 25 MG/ML IJ SOLN
25.0000 mg | Freq: Once | INTRAMUSCULAR | Status: AC
  Administered 2014-03-16: 25 mg via INTRAVENOUS
  Filled 2014-03-16: qty 1

## 2014-03-16 MED ORDER — HYDROCODONE-ACETAMINOPHEN 5-325 MG PO TABS: 1.0000 | ORAL_TABLET | Freq: Four times a day (QID) | ORAL | Status: DC | PRN

## 2014-03-16 MED ORDER — SODIUM CHLORIDE 0.9 % IV BOLUS (SEPSIS)
1000.0000 mL | Freq: Once | INTRAVENOUS | Status: AC
  Administered 2014-03-16: 1000 mL via INTRAVENOUS

## 2014-03-16 NOTE — ED Notes (Signed)
Patient declined wheelchair upon d/c. Ambulatory without any assistance.

## 2014-03-16 NOTE — ED Provider Notes (Signed)
CSN: [id number]     Arrival date & time 03/16/14  1829 History   First MD Initiated Contact with Patient 03/16/14 1851     Chief Complaint  Patient presents with  . Nausea  . Emesis  . Hypotension  . Near Syncope     (Consider location/radiation/quality/duration/timing/severity/associated sxs/prior Treatment) HPI  Pt is a 53yo female with hx of fibromyalgia, cardiomyopathy, PE, gastritis, anemia, GERD, tachycardia for which she takes coreg for, presenting to ED with c/o waking up this morning with epigastric pain that is aching and tight, radiating into center of her chest and feels nauseated in her throat.  Reports vomiting almost once every 1 hour after waking this morning.  Reports feeling her heart racing this morning so she took 2 coreg as directed by her cardiologist previously.  Reports having had similar symptoms every other week for 4 weeks, is followed by Dr. Edison Nasuti with [facility], scheduled for an MRI on Wednesday, 11/11 to check for stones as pt has already had a lap cholecystectomy 4 years ago. Pt believes she has had pancreatitis in the past.  Denies fever, chills, urinary or vaginal symptoms.   Past Medical History  Diagnosis Date  . Depression   . Migraine   . Fibromyalgia   . Hearing impairment   . Arthritis   . Cardiomyopathy   . Pulmonary embolism   . Gastritis   . Osteoporosis   . Anemia   . Allergy     SEASONAL  . Anxiety   . GERD (gastroesophageal reflux disease)   . Hypertension     [name], take htn medication to regulate heart beat.  . Sleep apnea    Past Surgical History  Procedure Laterality Date  . Appendectomy    . Inner ear surgery      TUBES  . Tonsillectomy and adenoidectomy    . Tubal ligation    . Laparoscopic cholecystectomy  2012  . Cardiac surgery  January 2007    mitral valve repair  . Cardiac surgery  1973    atrial septal defect   Family History  Problem Relation Age of Onset  . Breast cancer Mother     bilateral; ages 26 and  29; TAH/BSO ~50  . Depression Sister   . Heart disease Father   . Hypertension Father    History  Substance Use Topics  . Smoking status: Never Smoker   . Smokeless tobacco: Not on file  . Alcohol Use: No   OB History    Gravida Para Term Preterm AB TAB SAB Ectopic Multiple Living   2 2 2       2      Review of Systems  Constitutional: Negative for chills and fatigue.  Respiratory: Negative for cough and shortness of breath.   Cardiovascular: Positive for chest pain ( tightness) and palpitations.  Gastrointestinal: Positive for nausea, vomiting, abdominal pain ( epigastric) and diarrhea. Negative for constipation, blood in stool and rectal pain.  Genitourinary: Negative for dysuria, frequency and flank pain.  Musculoskeletal: Positive for back pain ( mid-back "from hunching over to vomit"). Negative for myalgias and arthralgias.  All other systems reviewed and are negative.     Allergies  Depakote and Valium  Home Medications   Prior to Admission medications   Medication Sig Start Date End Date Taking? Authorizing Provider  aspirin EC 81 MG tablet Take 81 mg by mouth every morning.   Yes Historical Provider, MD  BuPROPion HCl ER, XL, (FORFIVO XL) 450 MG  TB24 Take 450 mg by mouth every morning. 08/22/13  Yes Norma Fredrickson, MD  butalbital-acetaminophen-caffeine (FIORICET, ESGIC) [phone] MG per tablet Take 1 tablet by mouth daily as needed for migraine (migraine). For migraines 01/16/14  Yes Historical Provider, MD  Calcium Carbonate-Vitamin D (CALCIUM 600 + D PO) Take 1 tablet by mouth 2 (two) times daily.   Yes Historical Provider, MD  carvedilol (COREG) 25 MG tablet Take 1 tablet (25 mg total) by mouth 2 (two) times daily with a meal. 07/23/13  Yes Candee Furbish, MD  desvenlafaxine (PRISTIQ) 100 MG 24 hr tablet Take 1 tablet (100 mg total) by mouth daily. 08/22/13  Yes Norma Fredrickson, MD  Flaxseed, Linseed, (FLAXSEED OIL) 1000 MG CAPS Take 1,000 mg by mouth 2 (two) times daily.    Yes Historical Provider, MD  hydrochlorothiazide (MICROZIDE) 12.5 MG capsule Take 12.5 mg by mouth every morning.    Yes Historical Provider, MD  loratadine (CLARITIN) 10 MG tablet Take 10 mg by mouth daily.   Yes Historical Provider, MD  meloxicam (MOBIC) 15 MG tablet Take 15 mg by mouth daily.  11/12/13  Yes Historical Provider, MD  mirabegron ER (MYRBETRIQ) 25 MG TB24 tablet Take 1 tablet (25 mg total) by mouth daily. 06/13/13  Yes Terrance Mass, MD  Multiple Vitamin (MULTIVITAMIN) tablet Take 1 tablet by mouth daily.    Yes Historical Provider, MD  ondansetron (ZOFRAN) 4 MG tablet Take 4 mg by mouth every 6 (six) hours as needed for nausea or vomiting (nausea).    Yes Historical Provider, MD  pantoprazole (PROTONIX) 40 MG tablet Take 1 tablet (40 mg total) by mouth daily. 03/01/14  Yes Evelina Bucy, MD  HYDROcodone-acetaminophen (NORCO/VICODIN) 5-325 MG per tablet Take 1-2 tablets by mouth every 6 (six) hours as needed for moderate pain. 11/21/13   Antonietta Breach, PA-C  HYDROcodone-acetaminophen (NORCO/VICODIN) 5-325 MG per tablet Take 1-2 tablets by mouth every 6 (six) hours as needed for moderate pain or severe pain. 03/16/14   Noland Fordyce, PA-C  pantoprazole (PROTONIX) 40 MG tablet Take 1 tablet (40 mg total) by mouth daily. 10/29/13   Milus Banister, MD  prochlorperazine (COMPAZINE) 10 MG tablet Take 10 mg by mouth every 6 (six) hours as needed for nausea or vomiting.  10/26/13   Historical Provider, MD  promethazine (PHENERGAN) 25 MG tablet Take 1 tablet (25 mg total) by mouth every 6 (six) hours as needed for nausea or vomiting. 11/21/13   Antonietta Breach, PA-C   BP 93/67 mmHg  Pulse 80  Temp(Src) 98 F (36.7 C) (Oral)  Resp 16  SpO2 92% Physical Exam  Constitutional: She appears well-developed and well-nourished.  Pt lying in exam bed, appears uncomfortable, rubbing her abdomen  HENT:  Head: Normocephalic and atraumatic.  Eyes: Conjunctivae are normal. No scleral icterus.  Neck: Normal  range of motion.  Cardiovascular: Normal rate, regular rhythm and normal heart sounds.   Regular rate and rhythm   Pulmonary/Chest: Effort normal and breath sounds normal. No respiratory distress. She has no wheezes. She has no rales. She exhibits no tenderness.  Lungs: CTAB  Abdominal: Soft. Bowel sounds are normal. She exhibits no distension and no mass. There is tenderness ( epigastric). There is no rebound and no guarding.  Musculoskeletal: Normal range of motion.  Neurological: She is alert.  Skin: Skin is warm and dry. She is not diaphoretic.  Nursing note and vitals reviewed.   ED Course  Procedures (including critical care time) Labs Review Labs Reviewed  CBC WITH DIFFERENTIAL - Abnormal; Notable for the following:    Neutrophils Relative % 88 (*)    Lymphocytes Relative 9 (*)    All other components within normal limits  COMPREHENSIVE METABOLIC PANEL - Abnormal; Notable for the following:    Sodium 136 (*)    Glucose, Bld 65 (*)    BUN 29 (*)    GFR calc non Af Amer 77 (*)    GFR calc Af Amer 89 (*)    Anion gap 18 (*)    All other components within normal limits  LIPASE, BLOOD  I-STAT TROPOININ, ED    Imaging Review No results found.   EKG Interpretation None      MDM   Final diagnoses:  Epigastric pain  Near syncope    Pt is a 53yo female presenting to ED with epigastric pain radiating into chest and throat. Pt has been evaluated in past for similar complaints. F/u with Dr. Ardis Hughs, GI, scheduled for an MRI on Wednesday, 11/11 for further evaluation for recurrent symptoms.  Doubt ACS, PE or pneumonia.  Symptoms c/w GERD vs gastritis vs pancreatitis. Pt had cholecystectomy 4 years ago. Not concerned for SBO.  Labs: unremarkable.  Vitals: initial mild hypotension with BP 100/64, however, after pt given 1L IV fluids, pt's orthostatic vitals improved to 118/72, HR 83 lying to 129/83, HR 88 standing.   Pt able to keep down several ounce of PO fluids, and  crackers with peanut butter.states after GI cocktail pain improved more.  While in ED, pain improved from 8/10 to 5/10.   Discussed pt with Dr. Dorie Rank who also examined pt. Pt may be discharged home to f/u with GI as scheduled for Wednesday, 11/11.  Home care instructions provided. Return precautions provided. Pt verbalized understanding and agreement with tx plan.     Noland Fordyce, PA-C 03/17/14 0100  Dorie Rank, MD 03/17/14 [phone]

## 2014-03-16 NOTE — ED Notes (Signed)
Bed: WA06 Expected date: 03/16/14 Expected time: 6:25 PM Means of arrival: Ambulance Comments: N/V hypotension

## 2014-03-16 NOTE — ED Notes (Addendum)
Pt in from home by ems. Normal state of health last night. Woke up with heart racing and took double dose of Coreg as previously instructed by cardiologist. Around noon, began having "countless" episodes n/v, small amount of diarrhea. C/o epigastric "tight" pain, chest pain/pressure. Similar episodes recently in last 4 months, every other week. Has MRI scheduled Monday for evaluation of possible gallstone left after lap chole 4 years ago. Received 8mg  Zofran IV from EMS en route. 147ml NS. 24g L AC.

## 2014-03-20 ENCOUNTER — Ambulatory Visit (HOSPITAL_COMMUNITY)
Admission: RE | Admit: 2014-03-20 | Discharge: 2014-03-20 | Disposition: A | Discharge status: Home / Self Care | Payer: Medicare Other | Source: Ambulatory Visit | Attending: Gastroenterology | Admitting: Gastroenterology

## 2014-03-20 ENCOUNTER — Other Ambulatory Visit: Payer: Self-pay | Admitting: Gastroenterology

## 2014-03-20 DIAGNOSIS — R1314 Dysphagia, pharyngoesophageal phase: Secondary | ICD-10-CM

## 2014-03-20 DIAGNOSIS — Z9049 Acquired absence of other specified parts of digestive tract: Secondary | ICD-10-CM | POA: Insufficient documentation

## 2014-03-20 DIAGNOSIS — R109 Unspecified abdominal pain: Secondary | ICD-10-CM | POA: Diagnosis not present

## 2014-03-20 DIAGNOSIS — K449 Diaphragmatic hernia without obstruction or gangrene: Secondary | ICD-10-CM | POA: Insufficient documentation

## 2014-03-20 DIAGNOSIS — R1110 Vomiting, unspecified: Secondary | ICD-10-CM | POA: Diagnosis not present

## 2014-03-20 DIAGNOSIS — R1310 Dysphagia, unspecified: Secondary | ICD-10-CM | POA: Diagnosis not present

## 2014-03-20 DIAGNOSIS — Q396 Congenital diverticulum of esophagus: Secondary | ICD-10-CM | POA: Diagnosis not present

## 2014-03-20 DIAGNOSIS — K222 Esophageal obstruction: Secondary | ICD-10-CM | POA: Diagnosis not present

## 2014-03-20 MED ORDER — GADOBENATE DIMEGLUMINE 529 MG/ML IV SOLN
13.0000 mL | Freq: Once | INTRAVENOUS | Status: AC | PRN
  Administered 2014-03-20: 13 mL via INTRAVENOUS

## 2014-03-22 ENCOUNTER — Encounter (HOSPITAL_BASED_OUTPATIENT_CLINIC_OR_DEPARTMENT_OTHER): Payer: Self-pay

## 2014-03-22 ENCOUNTER — Emergency Department (HOSPITAL_BASED_OUTPATIENT_CLINIC_OR_DEPARTMENT_OTHER)
Admission: EM | Admit: 2014-03-22 | Discharge: 2014-03-22 | Disposition: A | Discharge status: Home / Self Care | Payer: Medicare Other | Attending: Emergency Medicine | Admitting: Emergency Medicine

## 2014-03-22 DIAGNOSIS — M1990 Unspecified osteoarthritis, unspecified site: Secondary | ICD-10-CM | POA: Diagnosis not present

## 2014-03-22 DIAGNOSIS — F419 Anxiety disorder, unspecified: Secondary | ICD-10-CM | POA: Insufficient documentation

## 2014-03-22 DIAGNOSIS — K219 Gastro-esophageal reflux disease without esophagitis: Secondary | ICD-10-CM | POA: Insufficient documentation

## 2014-03-22 DIAGNOSIS — Z79899 Other long term (current) drug therapy: Secondary | ICD-10-CM | POA: Insufficient documentation

## 2014-03-22 DIAGNOSIS — F329 Major depressive disorder, single episode, unspecified: Secondary | ICD-10-CM | POA: Insufficient documentation

## 2014-03-22 DIAGNOSIS — Z7982 Long term (current) use of aspirin: Secondary | ICD-10-CM | POA: Diagnosis not present

## 2014-03-22 DIAGNOSIS — Z86711 Personal history of pulmonary embolism: Secondary | ICD-10-CM | POA: Insufficient documentation

## 2014-03-22 DIAGNOSIS — R51 Headache: Secondary | ICD-10-CM | POA: Diagnosis not present

## 2014-03-22 DIAGNOSIS — Z862 Personal history of diseases of the blood and blood-forming organs and certain disorders involving the immune mechanism: Secondary | ICD-10-CM | POA: Insufficient documentation

## 2014-03-22 DIAGNOSIS — M810 Age-related osteoporosis without current pathological fracture: Secondary | ICD-10-CM | POA: Insufficient documentation

## 2014-03-22 DIAGNOSIS — R112 Nausea with vomiting, unspecified: Secondary | ICD-10-CM | POA: Diagnosis not present

## 2014-03-22 DIAGNOSIS — G43909 Migraine, unspecified, not intractable, without status migrainosus: Secondary | ICD-10-CM | POA: Insufficient documentation

## 2014-03-22 DIAGNOSIS — I10 Essential (primary) hypertension: Secondary | ICD-10-CM | POA: Insufficient documentation

## 2014-03-22 DIAGNOSIS — Z791 Long term (current) use of non-steroidal anti-inflammatories (NSAID): Secondary | ICD-10-CM | POA: Diagnosis not present

## 2014-03-22 DIAGNOSIS — R519 Headache, unspecified: Secondary | ICD-10-CM

## 2014-03-22 MED ORDER — PROMETHAZINE HCL 25 MG/ML IJ SOLN
25.0000 mg | Freq: Once | INTRAMUSCULAR | Status: AC
  Administered 2014-03-22: 25 mg via INTRAMUSCULAR
  Filled 2014-03-22: qty 1

## 2014-03-22 MED ORDER — DEXAMETHASONE SODIUM PHOSPHATE 4 MG/ML IJ SOLN
INTRAMUSCULAR | Status: AC
  Administered 2014-03-22: 10 mg via INTRAMUSCULAR
  Filled 2014-03-22: qty 3

## 2014-03-22 MED ORDER — KETOROLAC TROMETHAMINE 60 MG/2ML IM SOLN
60.0000 mg | Freq: Once | INTRAMUSCULAR | Status: AC
  Administered 2014-03-22: 60 mg via INTRAMUSCULAR
  Filled 2014-03-22: qty 2

## 2014-03-22 MED ORDER — DEXAMETHASONE SODIUM PHOSPHATE 4 MG/ML IJ SOLN
10.0000 mg | Freq: Once | INTRAMUSCULAR | Status: AC
  Administered 2014-03-22: 10 mg via INTRAMUSCULAR

## 2014-03-22 MED ORDER — DEXAMETHASONE SODIUM PHOSPHATE 10 MG/ML IJ SOLN: 10.0000 mg | Freq: Once | INTRAMUSCULAR | Status: DC

## 2014-03-22 MED ORDER — DIPHENHYDRAMINE HCL 50 MG/ML IJ SOLN
25.0000 mg | Freq: Once | INTRAMUSCULAR | Status: AC
  Administered 2014-03-22: 25 mg via INTRAMUSCULAR
  Filled 2014-03-22: qty 1

## 2014-03-22 NOTE — ED Notes (Signed)
NP at bedside.

## 2014-03-22 NOTE — Discharge Instructions (Signed)

## 2014-03-22 NOTE — ED Provider Notes (Signed)
CSN: [id number]     Arrival date & time 03/22/14  1054 History   First MD Initiated Contact with Patient 03/22/14 1208     Chief Complaint  Patient presents with  . Migraine     (Consider location/radiation/quality/duration/timing/severity/associated sxs/prior Treatment) HPI Comments: Pt comes in today with migraine that started this morning. She states that she took fiorcet without relief. She states that she vomited times one. This is her typical migraine. She states that the only thing that helps is toradol and dilaudid.  Patient is a 53 y.o. female presenting with migraines. The history is provided by the patient. No language interpreter was used.  Migraine This is a new problem. The current episode started today. The problem occurs constantly. The problem has been unchanged. Associated symptoms include nausea and vomiting. Pertinent negatives include no fever, neck pain or visual change. Nothing aggravates the symptoms. Treatments tried: fioricet.    Past Medical History  Diagnosis Date  . Depression   . Migraine   . Fibromyalgia   . Hearing impairment   . Arthritis   . Cardiomyopathy   . Pulmonary embolism   . Gastritis   . Osteoporosis   . Anemia   . Allergy     SEASONAL  . Anxiety   . GERD (gastroesophageal reflux disease)   . Hypertension     [name], take htn medication to regulate heart beat.  . Sleep apnea    Past Surgical History  Procedure Laterality Date  . Appendectomy    . Inner ear surgery      TUBES  . Tonsillectomy and adenoidectomy    . Tubal ligation    . Laparoscopic cholecystectomy  2012  . Cardiac surgery  January 2007    mitral valve repair  . Cardiac surgery  1973    atrial septal defect   Family History  Problem Relation Age of Onset  . Breast cancer Mother     bilateral; ages 46 and 70; TAH/BSO ~50  . Depression Sister   . Heart disease Father   . Hypertension Father    History  Substance Use Topics  . Smoking status: Never  Smoker   . Smokeless tobacco: Not on file  . Alcohol Use: No   OB History    Gravida Para Term Preterm AB TAB SAB Ectopic Multiple Living   2 2 2       2      Review of Systems  Constitutional: Negative for fever.  Gastrointestinal: Positive for nausea and vomiting.  Musculoskeletal: Negative for neck pain.  All other systems reviewed and are negative.     Allergies  Depakote and Valium  Home Medications   Prior to Admission medications   Medication Sig Start Date End Date Taking? Authorizing Provider  aspirin EC 81 MG tablet Take 81 mg by mouth every morning.    Historical Provider, MD  BuPROPion HCl ER, XL, (FORFIVO XL) 450 MG TB24 Take 450 mg by mouth every morning. 08/22/13   Norma Fredrickson, MD  butalbital-acetaminophen-caffeine (FIORICET, ESGIC) [phone] MG per tablet Take 1 tablet by mouth daily as needed for migraine (migraine). For migraines 01/16/14   Historical Provider, MD  Calcium Carbonate-Vitamin D (CALCIUM 600 + D PO) Take 1 tablet by mouth 2 (two) times daily.    Historical Provider, MD  carvedilol (COREG) 25 MG tablet Take 1 tablet (25 mg total) by mouth 2 (two) times daily with a meal. 07/23/13   Candee Furbish, MD  desvenlafaxine (PRISTIQ) 100 MG 24  hr tablet Take 1 tablet (100 mg total) by mouth daily. 08/22/13   Norma Fredrickson, MD  Flaxseed, Linseed, (FLAXSEED OIL) 1000 MG CAPS Take 1,000 mg by mouth 2 (two) times daily.    Historical Provider, MD  hydrochlorothiazide (MICROZIDE) 12.5 MG capsule Take 12.5 mg by mouth every morning.     Historical Provider, MD  HYDROcodone-acetaminophen (NORCO/VICODIN) 5-325 MG per tablet Take 1-2 tablets by mouth every 6 (six) hours as needed for moderate pain. 11/21/13   Antonietta Breach, PA-C  HYDROcodone-acetaminophen (NORCO/VICODIN) 5-325 MG per tablet Take 1-2 tablets by mouth every 6 (six) hours as needed for moderate pain or severe pain. 03/16/14   Noland Fordyce, PA-C  loratadine (CLARITIN) 10 MG tablet Take 10 mg by mouth daily.     Historical Provider, MD  meloxicam (MOBIC) 15 MG tablet Take 15 mg by mouth daily.  11/12/13   Historical Provider, MD  mirabegron ER (MYRBETRIQ) 25 MG TB24 tablet Take 1 tablet (25 mg total) by mouth daily. 06/13/13   Terrance Mass, MD  Multiple Vitamin (MULTIVITAMIN) tablet Take 1 tablet by mouth daily.     Historical Provider, MD  ondansetron (ZOFRAN) 4 MG tablet Take 4 mg by mouth every 6 (six) hours as needed for nausea or vomiting (nausea).     Historical Provider, MD  pantoprazole (PROTONIX) 40 MG tablet Take 1 tablet (40 mg total) by mouth daily. 10/29/13   Milus Banister, MD  pantoprazole (PROTONIX) 40 MG tablet Take 1 tablet (40 mg total) by mouth daily. 03/01/14   Evelina Bucy, MD  prochlorperazine (COMPAZINE) 10 MG tablet Take 10 mg by mouth every 6 (six) hours as needed for nausea or vomiting.  10/26/13   Historical Provider, MD  promethazine (PHENERGAN) 25 MG tablet Take 1 tablet (25 mg total) by mouth every 6 (six) hours as needed for nausea or vomiting. 11/21/13   Antonietta Breach, PA-C   BP 124/88 mmHg  Pulse 86  Temp(Src) 98 F (36.7 C) (Oral)  Resp 16  Ht 5\' 5"  (1.651 m)  Wt 150 lb (68.04 kg)  BMI 24.96 kg/m2  SpO2 99% Physical Exam  Constitutional: She is oriented to person, place, and time. She appears well-developed and well-nourished.  HENT:  Head: Normocephalic and atraumatic.  Right Ear: External ear normal.  Left Ear: External ear normal.  Eyes: Conjunctivae and EOM are normal. Pupils are equal, round, and reactive to light.  Neck: Normal range of motion. Neck supple.  Cardiovascular: Normal rate and regular rhythm.   Pulmonary/Chest: Effort normal and breath sounds normal.  Musculoskeletal: Normal range of motion.  Neurological: She is alert and oriented to person, place, and time. Coordination normal.  Skin: Skin is warm and dry.  Psychiatric: She has a normal mood and affect.  Nursing note and vitals reviewed.   ED Course  Procedures (including critical care  time) Labs Review Labs Reviewed - No data to display  Imaging Review No results found.   EKG Interpretation None      MDM   Final diagnoses:  Headache, unspecified headache type    Pt is feeling better and ready to go home. Consistent with previous migraines    Glendell Docker, NP 03/22/14 [place], MD 03/22/14 [phone]

## 2014-03-22 NOTE — ED Notes (Signed)
Pt reports hx of migraines, woke up at 4am with migraine and took fiorcet and then again at 9am without relief.  Pt reports has vomited once.  Pt reports friend dropped her off.  Also reports this feels like her typical migraine.

## 2014-03-29 ENCOUNTER — Ambulatory Visit (AMBULATORY_SURGERY_CENTER): Payer: Self-pay | Admitting: *Deleted

## 2014-03-29 VITALS — Ht 65.0 in | Wt 150.0 lb

## 2014-03-29 DIAGNOSIS — G43119 Migraine with aura, intractable, without status migrainosus: Secondary | ICD-10-CM | POA: Insufficient documentation

## 2014-03-29 DIAGNOSIS — M797 Fibromyalgia: Secondary | ICD-10-CM | POA: Insufficient documentation

## 2014-03-29 DIAGNOSIS — D509 Iron deficiency anemia, unspecified: Secondary | ICD-10-CM | POA: Insufficient documentation

## 2014-03-29 DIAGNOSIS — R5383 Other fatigue: Secondary | ICD-10-CM | POA: Insufficient documentation

## 2014-03-29 DIAGNOSIS — E7800 Pure hypercholesterolemia, unspecified: Secondary | ICD-10-CM | POA: Insufficient documentation

## 2014-03-29 DIAGNOSIS — Q211 Atrial septal defect, unspecified: Secondary | ICD-10-CM | POA: Insufficient documentation

## 2014-03-29 DIAGNOSIS — M2550 Pain in unspecified joint: Secondary | ICD-10-CM | POA: Insufficient documentation

## 2014-03-29 DIAGNOSIS — I2510 Atherosclerotic heart disease of native coronary artery without angina pectoris: Secondary | ICD-10-CM | POA: Insufficient documentation

## 2014-03-29 DIAGNOSIS — R0602 Shortness of breath: Secondary | ICD-10-CM | POA: Insufficient documentation

## 2014-03-29 DIAGNOSIS — G43009 Migraine without aura, not intractable, without status migrainosus: Secondary | ICD-10-CM | POA: Insufficient documentation

## 2014-03-29 DIAGNOSIS — I059 Rheumatic mitral valve disease, unspecified: Secondary | ICD-10-CM | POA: Insufficient documentation

## 2014-03-29 DIAGNOSIS — R1314 Dysphagia, pharyngoesophageal phase: Secondary | ICD-10-CM

## 2014-03-29 HISTORY — DX: Iron deficiency anemia, unspecified: D50.9

## 2014-03-29 NOTE — Progress Notes (Signed)
Patient denies any allergies to eggs or soy. Patient denies any problems with anesthesia/sedation. Patient denies any oxygen use at home and does not take any diet/weight loss medications. EMMI education assisgned to patient on EGD, this was explained and instructions given to patient. 

## 2014-04-03 ENCOUNTER — Ambulatory Visit (AMBULATORY_SURGERY_CENTER): Payer: Medicare Other | Admitting: Gastroenterology

## 2014-04-03 ENCOUNTER — Encounter: Payer: Self-pay | Admitting: Gastroenterology

## 2014-04-03 VITALS — BP 107/68 | HR 87 | Temp 98.0°F | Resp 20 | Ht 65.0 in | Wt 150.0 lb

## 2014-04-03 DIAGNOSIS — F341 Dysthymic disorder: Secondary | ICD-10-CM | POA: Diagnosis not present

## 2014-04-03 DIAGNOSIS — R1310 Dysphagia, unspecified: Secondary | ICD-10-CM | POA: Diagnosis not present

## 2014-04-03 DIAGNOSIS — R1314 Dysphagia, pharyngoesophageal phase: Secondary | ICD-10-CM | POA: Diagnosis not present

## 2014-04-03 DIAGNOSIS — G4733 Obstructive sleep apnea (adult) (pediatric): Secondary | ICD-10-CM | POA: Diagnosis not present

## 2014-04-03 DIAGNOSIS — I2510 Atherosclerotic heart disease of native coronary artery without angina pectoris: Secondary | ICD-10-CM | POA: Diagnosis not present

## 2014-04-03 DIAGNOSIS — I10 Essential (primary) hypertension: Secondary | ICD-10-CM | POA: Diagnosis not present

## 2014-04-03 DIAGNOSIS — B3781 Candidal esophagitis: Secondary | ICD-10-CM | POA: Diagnosis not present

## 2014-04-03 DIAGNOSIS — R1319 Other dysphagia: Secondary | ICD-10-CM

## 2014-04-03 DIAGNOSIS — M797 Fibromyalgia: Secondary | ICD-10-CM | POA: Diagnosis not present

## 2014-04-03 MED ORDER — FLUCONAZOLE 100 MG PO TABS: 100.0000 mg | ORAL_TABLET | Freq: Every day | ORAL | Status: DC

## 2014-04-03 MED ORDER — SODIUM CHLORIDE 0.9 % IV SOLN: 500.0000 mL | INTRAVENOUS | Status: DC

## 2014-04-03 NOTE — Patient Instructions (Signed)
YOU HAD AN ENDOSCOPIC PROCEDURE TODAY AT THE [facility]: Refer to the procedure report that was given to you for any specific questions about what was found during the examination.  If the procedure report does not answer your questions, please call your gastroenterologist to clarify.  If you requested that your care partner not be given the details of your procedure findings, then the procedure report has been included in a sealed envelope for you to review at your convenience later.  YOU SHOULD EXPECT: Some feelings of bloating in the abdomen. Passage of more gas than usual.  Walking can help get rid of the air that was put into your GI tract during the procedure and reduce the bloating. If you had a lower endoscopy (such as a colonoscopy or flexible sigmoidoscopy) you may notice spotting of blood in your stool or on the toilet paper. If you underwent a bowel prep for your procedure, then you may not have a normal bowel movement for a few days.  DIET: Your first meal following the procedure should be a light meal and then it is ok to progress to your normal diet.  A half-sandwich or bowl of soup is an example of a good first meal.  Heavy or fried foods are harder to digest and may make you feel nauseous or bloated.  Likewise meals heavy in dairy and vegetables can cause extra gas to form and this can also increase the bloating.  Drink plenty of fluids but you should avoid alcoholic beverages for 24 hours.  ACTIVITY: Your care partner should take you home directly after the procedure.  You should plan to take it easy, moving slowly for the rest of the day.  You can resume normal activity the day after the procedure however you should NOT DRIVE or use heavy machinery for 24 hours (because of the sedation medicines used during the test).    SYMPTOMS TO REPORT IMMEDIATELY: A gastroenterologist can be reached at any hour.  During normal business hours, 8:30 AM to 5:00 PM Monday through Friday,  call [phone].  After hours and on weekends, please call the GI answering service at [phone] who will take a message and have the physician on call contact you.    Following upper endoscopy (EGD)  Vomiting of blood or coffee ground material  New chest pain or pain under the shoulder blades  Painful or persistently difficult swallowing  New shortness of breath  Fever of 100F or higher  Black, tarry-looking stools  FOLLOW UP: If any biopsies were taken you will be contacted by phone or by letter within the next 1-3 weeks.  Call your gastroenterologist if you have not heard about the biopsies in 3 weeks.  Our staff will call the home number listed on your records the next business day following your procedure to check on you and address any questions or concerns that you may have at that time regarding the information given to you following your procedure. This is a courtesy call and so if there is no answer at the home number and we have not heard from you through the emergency physician on call, we will assume that you have returned to your regular daily activities without incident.  SIGNATURES/CONFIDENTIALITY: You and/or your care partner have signed paperwork which will be entered into your electronic medical record.  These signatures attest to the fact that that the information above on your After Visit Summary has been reviewed and is understood.  Full   responsibility of the confidentiality of this discharge information lies with you and/or your care-partner.   Resume medication.

## 2014-04-03 NOTE — Progress Notes (Signed)
Pt has a a yellowish colored bruise estimate 2 " x1 1/2 " on her right lower forearm.  Also a pale yellow colored bruise about size of a quarter on the top of her left hand.  maw

## 2014-04-03 NOTE — Progress Notes (Signed)
Report to PACU, RN, vss, BBS= Clear.  

## 2014-04-03 NOTE — Op Note (Signed)
[place]  [address]. [place] [employer] [place], [id number]   ENDOSCOPY PROCEDURE REPORT  PATIENT: [name], [name]  MR#: [id number] BIRTHDATE: 01-Jun-1960 , 62  yrs. 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  EGD 1 year ago showed HH, mild manilia esophagitis, gastritis; recent barium esophagram showed diffusely narrowed esophagus.[name] MEDICATIONS: Monitored anesthesia care and Propofol 120 mg IV TOPICAL ANESTHETIC: none  DESCRIPTION OF PROCEDURE: After the risks benefits and alternatives of the procedure were thoroughly explained, informed consent was obtained.  The LB [id number] endoscope was introduced through the mouth and advanced to the second portion of the duodenum , Without limitations.  The instrument was slowly withdrawn as the mucosa was fully examined.  There was overt candida esophagitis, mainly in proximal esophagus. The GE junction was normal (not stenotic or strictured).  There was a 4cm hiatal hernia with typical resulting foreshortened and tortuous esophagus.  Retroflexed views revealed no abnormalities. The scope was then withdrawn from the patient and the procedure completed.  COMPLICATIONS: There were no immediate complications.  ENDOSCOPIC IMPRESSION: There was overt candida esophagitis, mainly in proximal esophagus. The GE junction was normal (not stenotic or strictured).  There was a 4cm hiatal hernia with typical resulting foreshortened and tortuous esophagus  RECOMMENDATIONS: Repeat diflucan course (10 day course called in).  You should eat slowly, take small bites and chew your food very well.  Please call my office in 2 weeks (after completing the diflucan) to report on your response.  Will consider hi-resolution manometry if still bothered with  dysphagia at that point.   eSigned:  Milus Banister, MD 04/03/2014 10:43 AM

## 2014-04-08 ENCOUNTER — Encounter: Payer: Self-pay | Admitting: Cardiology

## 2014-04-08 ENCOUNTER — Telehealth: Payer: Self-pay | Admitting: *Deleted

## 2014-04-08 ENCOUNTER — Ambulatory Visit (INDEPENDENT_AMBULATORY_CARE_PROVIDER_SITE_OTHER): Payer: Medicare Other | Admitting: Cardiology

## 2014-04-08 VITALS — BP 122/84 | HR 86 | Ht 65.0 in | Wt 145.0 lb

## 2014-04-08 DIAGNOSIS — Q211 Atrial septal defect, unspecified: Secondary | ICD-10-CM

## 2014-04-08 DIAGNOSIS — E785 Hyperlipidemia, unspecified: Secondary | ICD-10-CM

## 2014-04-08 DIAGNOSIS — R0600 Dyspnea, unspecified: Secondary | ICD-10-CM

## 2014-04-08 NOTE — Progress Notes (Signed)
[place] [address], [address], [place]  [address] Phone: [phone] Fax:  [phone]  Date:  04/08/2014   ID:  SHELIA KINGSBERRY, DOB [birth year], MRN [id number]  PCP:  Gavin Pound, MD   History of Present Illness: [name] is a 53 y.o. 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 She felt dizzy and felt as though she was going to pass out, hands were numb. She had nausea as well as epigastric abdominal discomfort, nonradiating, crampy. 7/10. She took an extra dose of her carvedilol 25 mg during episode of palpitations. Chest x-ray unremarkable. EKG on 06/24/13 showed nonspecific ST-T wave changes, sinus rhythm, borderline QT prolongation. Once she ate she felt better.   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 She was having symptoms of trouble swallowing.  She has noted shortness of breath with minimal activity. Pounding of heart during minimal activity. She is concerned that her heart may be weekend after her multiple illnesses.   Wt Readings from Last 3 Encounters:  04/08/14 145 lb (65.772 kg)  04/03/14 150 lb (68.04 kg)  03/29/14 150 lb (68.04 kg)     Past Medical History  Diagnosis Date  . Depression   . Migraine   . Fibromyalgia   . Hearing impairment   . Arthritis   . Cardiomyopathy   . Pulmonary embolism   . Gastritis   . Osteoporosis   . Anemia   . Allergy     SEASONAL  . Anxiety   . GERD (gastroesophageal reflux disease)   . Hypertension     [name], take htn medication to regulate heart beat.  . Sleep apnea     Past Surgical History  Procedure Laterality Date  . Appendectomy    . Inner ear surgery      TUBES  . Tonsillectomy and adenoidectomy    . Tubal ligation    . Laparoscopic cholecystectomy   2012  . Cardiac surgery  January 2007    mitral valve repair  . Cardiac surgery  1973    atrial septal defect    Current Outpatient Prescriptions  Medication Sig Dispense Refill  . aspirin EC 81 MG tablet Take 81 mg by mouth every morning.    . BuPROPion HCl ER, XL, (FORFIVO XL) 450 MG TB24 Take 450 mg by mouth every morning. 90 tablet 1  . butalbital-acetaminophen-caffeine (FIORICET, ESGIC) 50-325-40 MG per tablet Take 1 tablet by mouth daily as needed for migraine (migraine). For migraines    . Calcium Carbonate-Vitamin D (CALCIUM 600 + D PO) Take 1 tablet by mouth 2 (two) times daily.    . carvedilol (COREG) 25 MG tablet Take 1 tablet (25 mg total) by mouth 2 (two) times daily with a meal. 60 tablet 3  . desvenlafaxine (PRISTIQ) 100 MG 24 hr tablet Take 1 tablet (100 mg total) by mouth daily. 90 tablet 1  . Flaxseed, Linseed, (FLAXSEED OIL) 1000 MG CAPS Take 1,000 mg by mouth 2 (two) times daily.    . fluconazole (DIFLUCAN) 100 MG tablet Take 1 tablet (100 mg total) by mouth daily. 10 tablet 3  . hydrochlorothiazide (MICROZIDE) 12.5 MG capsule Take 12.5 mg by mouth every morning.     . loratadine (CLARITIN) 10 MG tablet Take 10 mg  by mouth daily.    . mirabegron ER (MYRBETRIQ) 25 MG TB24 tablet Take 1 tablet (25 mg total) by mouth daily. 30 tablet 11  . Multiple Vitamin (MULTIVITAMIN) tablet Take 1 tablet by mouth daily.     . ondansetron (ZOFRAN) 4 MG tablet Take 4 mg by mouth every 6 (six) hours as needed for nausea or vomiting (nausea).     . pantoprazole (PROTONIX) 40 MG tablet Take 1 tablet (40 mg total) by mouth daily. 90 tablet 3  . pantoprazole (PROTONIX) 40 MG tablet Take 1 tablet (40 mg total) by mouth daily. 21 tablet 0  . prochlorperazine (COMPAZINE) 10 MG tablet Take 10 mg by mouth every 6 (six) hours as needed for nausea or vomiting.     . promethazine (PHENERGAN) 25 MG tablet Take 1 tablet (25 mg total) by mouth every 6 (six) hours as needed for nausea or vomiting. 20  tablet 0   No current facility-administered medications for this visit.    Allergies:    Allergies  Allergen Reactions  . Depakote [Divalproex Sodium] Anaphylaxis  . Valium Anaphylaxis    Social History:  The patient  reports that she has never smoked. She has never used smokeless tobacco. She reports that she does not drink alcohol or use illicit drugs.   ROS:  Please see the history of present illness.   Denies any fevers, chills, orthopnea, PND, no vomiting    PHYSICAL EXAM: VS:  BP 122/84 mmHg  Pulse 86  Ht 5\' 5"  (1.651 m)  Wt 145 lb (65.772 kg)  BMI 24.13 kg/m2 Well nourished, well developed, in no acute distress HEENT: normal Neck: no JVD Cardiac:  normal S1, S2; RRR; soft systolic murmur apex. Lungs:  clear to auscultation bilaterally, no wheezing, rhonchi or rales Abd: soft, nontender, no hepatomegaly Ext: no edema Skin: warm and dry Neuro: no focal abnormalities noted  EKG:  As above   Echocardiogram: 08/2011 -Normal ejection fraction -Mild mitral regurgitation, prior mitral valve repair -Intact ASD repair  ASSESSMENT AND PLAN:  1. Palpitations-no adverse arrhythmias detected in the emergency department. EKG unremarkable. Continuing with beta blocker. May have been anxiety, hunger provoking discomfort. After she ate, her symptoms resolved. Her tingling in her hands bilaterally he may have been hyperventilation. 2. Mitral valve repair, ASD repair-stable.echocardiogram 2013 reviewed 3. Dyspnea - will check echo. Sensation of shortness of breath, heart pounding with minimal activity may be from deconditioning especially from multiple hospitalizations. Continue to exercise 4. Hearing loss- she is going to check with ENT about cochlear implant. Would be fine from CV standpoint.  5. Hyperlipidemia-we will check lipid panel today. 6. I will see her back in 6 months  Signed, Candee Furbish, MD [facility]  04/08/2014 4:11 PM

## 2014-04-08 NOTE — Telephone Encounter (Signed)
  Follow up Call-  Call back number 04/03/2014 10/29/2013  Post procedure Call Back phone  # [phone] hm [phone]  Permission to leave phone message Yes Yes     Patient questions:  Do you have a fever, pain , or abdominal swelling? No. Pain Score  0 *  Have you tolerated food without any problems? Yes.    Have you been able to return to your normal activities? Yes.    Do you have any questions about your discharge instructions: Diet   No. Medications  No. Follow up visit  No.  Do you have questions or concerns about your Care? No.  Actions: * If pain score is 4 or above: No action needed, pain <4.

## 2014-04-08 NOTE — Patient Instructions (Signed)
The current medical regimen is effective;  continue present plan and medications.  Your physician has requested that you have an echocardiogram. Echocardiography is a painless test that uses sound waves to create images of your heart. It provides your doctor with information about the size and shape of your heart and how well your heart's chambers and valves are working. This procedure takes approximately one hour. There are no restrictions for this procedure.  Follow up in 6 months with Dr Skains.  You will receive a letter in the mail 2 months before you are due.  Please call us when you receive this letter to schedule your follow up appointment.  

## 2014-04-16 ENCOUNTER — Ambulatory Visit (HOSPITAL_COMMUNITY): Payer: Medicare Other | Attending: Cardiology

## 2014-04-16 DIAGNOSIS — R0600 Dyspnea, unspecified: Secondary | ICD-10-CM | POA: Diagnosis not present

## 2014-04-16 NOTE — Progress Notes (Signed)
2D Echo completed. 04/16/2014

## 2014-04-17 DIAGNOSIS — M4316 Spondylolisthesis, lumbar region: Secondary | ICD-10-CM | POA: Diagnosis not present

## 2014-04-21 ENCOUNTER — Emergency Department (HOSPITAL_BASED_OUTPATIENT_CLINIC_OR_DEPARTMENT_OTHER): Payer: Medicare Other

## 2014-04-21 ENCOUNTER — Encounter (HOSPITAL_BASED_OUTPATIENT_CLINIC_OR_DEPARTMENT_OTHER): Payer: Self-pay | Admitting: Emergency Medicine

## 2014-04-21 ENCOUNTER — Emergency Department (HOSPITAL_BASED_OUTPATIENT_CLINIC_OR_DEPARTMENT_OTHER)
Admission: EM | Admit: 2014-04-21 | Discharge: 2014-04-22 | Disposition: A | Discharge status: Home / Self Care | Payer: Medicare Other | Attending: Emergency Medicine | Admitting: Emergency Medicine

## 2014-04-21 DIAGNOSIS — R509 Fever, unspecified: Secondary | ICD-10-CM | POA: Insufficient documentation

## 2014-04-21 DIAGNOSIS — D72829 Elevated white blood cell count, unspecified: Secondary | ICD-10-CM | POA: Diagnosis not present

## 2014-04-21 DIAGNOSIS — G43909 Migraine, unspecified, not intractable, without status migrainosus: Secondary | ICD-10-CM | POA: Insufficient documentation

## 2014-04-21 DIAGNOSIS — Z862 Personal history of diseases of the blood and blood-forming organs and certain disorders involving the immune mechanism: Secondary | ICD-10-CM | POA: Diagnosis not present

## 2014-04-21 DIAGNOSIS — M1990 Unspecified osteoarthritis, unspecified site: Secondary | ICD-10-CM | POA: Insufficient documentation

## 2014-04-21 DIAGNOSIS — Z79899 Other long term (current) drug therapy: Secondary | ICD-10-CM | POA: Diagnosis not present

## 2014-04-21 DIAGNOSIS — R079 Chest pain, unspecified: Secondary | ICD-10-CM | POA: Diagnosis not present

## 2014-04-21 DIAGNOSIS — Z7982 Long term (current) use of aspirin: Secondary | ICD-10-CM | POA: Diagnosis not present

## 2014-04-21 DIAGNOSIS — R112 Nausea with vomiting, unspecified: Secondary | ICD-10-CM | POA: Diagnosis not present

## 2014-04-21 DIAGNOSIS — I10 Essential (primary) hypertension: Secondary | ICD-10-CM | POA: Diagnosis not present

## 2014-04-21 DIAGNOSIS — M810 Age-related osteoporosis without current pathological fracture: Secondary | ICD-10-CM | POA: Diagnosis not present

## 2014-04-21 DIAGNOSIS — Z86711 Personal history of pulmonary embolism: Secondary | ICD-10-CM | POA: Insufficient documentation

## 2014-04-21 DIAGNOSIS — F329 Major depressive disorder, single episode, unspecified: Secondary | ICD-10-CM | POA: Insufficient documentation

## 2014-04-21 DIAGNOSIS — K219 Gastro-esophageal reflux disease without esophagitis: Secondary | ICD-10-CM | POA: Insufficient documentation

## 2014-04-21 DIAGNOSIS — F419 Anxiety disorder, unspecified: Secondary | ICD-10-CM | POA: Diagnosis not present

## 2014-04-21 LAB — CBC WITH DIFFERENTIAL/PLATELET
Basophils Absolute: 0.1 10*3/uL (ref 0.0–0.1)
Basophils Relative: 0 % (ref 0–1)
EOS ABS: 0 10*3/uL (ref 0.0–0.7)
Eosinophils Relative: 0 % (ref 0–5)
HCT: 37.4 % (ref 36.0–46.0)
HEMOGLOBIN: 13 g/dL (ref 12.0–15.0)
LYMPHS ABS: 0.8 10*3/uL (ref 0.7–4.0)
Lymphocytes Relative: 6 % — ABNORMAL LOW (ref 12–46)
MCH: 33.2 pg (ref 26.0–34.0)
MCHC: 34.8 g/dL (ref 30.0–36.0)
MCV: 95.4 fL (ref 78.0–100.0)
MONOS PCT: 5 % (ref 3–12)
Monocytes Absolute: 0.6 10*3/uL (ref 0.1–1.0)
NEUTROS PCT: 89 % — AB (ref 43–77)
Neutro Abs: 11.7 10*3/uL — ABNORMAL HIGH (ref 1.7–7.7)
Platelets: 270 10*3/uL (ref 150–400)
RBC: 3.92 MIL/uL (ref 3.87–5.11)
RDW: 12.7 % (ref 11.5–15.5)
WBC: 13.2 10*3/uL — ABNORMAL HIGH (ref 4.0–10.5)

## 2014-04-21 LAB — COMPREHENSIVE METABOLIC PANEL
ALT: 16 U/L (ref 0–35)
AST: 23 U/L (ref 0–37)
Albumin: 4 g/dL (ref 3.5–5.2)
Alkaline Phosphatase: 95 U/L (ref 39–117)
Anion gap: 20 — ABNORMAL HIGH (ref 5–15)
BUN: 19 mg/dL (ref 6–23)
CALCIUM: 10 mg/dL (ref 8.4–10.5)
CO2: 22 mEq/L (ref 19–32)
CREATININE: 0.7 mg/dL (ref 0.50–1.10)
Chloride: 99 mEq/L (ref 96–112)
GFR calc non Af Amer: >90 mL/min (ref >90)
Glucose, Bld: 96 mg/dL (ref 70–99)
Potassium: 4.1 mEq/L (ref 3.7–5.3)
Sodium: 141 mEq/L (ref 137–147)
TOTAL PROTEIN: 7.8 g/dL (ref 6.0–8.3)
Total Bilirubin: 0.3 mg/dL (ref 0.3–1.2)

## 2014-04-21 LAB — TROPONIN I: Troponin I: <0.3 ng/mL (ref <0.30)

## 2014-04-21 LAB — LIPASE, BLOOD: LIPASE: 23 U/L (ref 11–59)

## 2014-04-21 MED ORDER — ACETAMINOPHEN 325 MG PO TABS
650.0000 mg | ORAL_TABLET | Freq: Once | ORAL | Status: AC
  Administered 2014-04-21: 650 mg via ORAL
  Filled 2014-04-21: qty 2

## 2014-04-21 MED ORDER — KETOROLAC TROMETHAMINE 30 MG/ML IJ SOLN
30.0000 mg | Freq: Once | INTRAMUSCULAR | Status: AC
  Administered 2014-04-22: 30 mg via INTRAVENOUS
  Filled 2014-04-21: qty 1

## 2014-04-21 MED ORDER — ONDANSETRON HCL 4 MG/2ML IJ SOLN
4.0000 mg | Freq: Once | INTRAMUSCULAR | Status: AC
  Administered 2014-04-21: 4 mg via INTRAVENOUS
  Filled 2014-04-21: qty 2

## 2014-04-21 MED ORDER — ONDANSETRON HCL 4 MG PO TABS: 4.0000 mg | ORAL_TABLET | Freq: Four times a day (QID) | ORAL | Status: DC

## 2014-04-21 MED ORDER — SODIUM CHLORIDE 0.9 % IV BOLUS (SEPSIS)
1000.0000 mL | Freq: Once | INTRAVENOUS | Status: AC
  Administered 2014-04-21: 1000 mL via INTRAVENOUS

## 2014-04-21 NOTE — ED Notes (Signed)
Pt able to tolerate PO fluids and ginger ale with no n/v/d noted.

## 2014-04-21 NOTE — Discharge Instructions (Signed)
The cause of your nausea was not identified today.  Please get rechecked by your family doctor tomorrow.   Nausea and Vomiting Nausea is a sick feeling that often comes before throwing up (vomiting). Vomiting is a reflex where stomach contents come out of your mouth. Vomiting can cause severe loss of body fluids (dehydration). Children and elderly adults can become dehydrated quickly, especially if they also have diarrhea. Nausea and vomiting are symptoms of a condition or disease. It is important to find the cause of your symptoms. CAUSES   Direct irritation of the stomach lining. This irritation can result from increased acid production (gastroesophageal reflux disease), infection, food poisoning, taking certain medicines (such as nonsteroidal anti-inflammatory drugs), alcohol use, or tobacco use.  Signals from the brain.These signals could be caused by a headache, heat exposure, an inner ear disturbance, increased pressure in the brain from injury, infection, a tumor, or a concussion, pain, emotional stimulus, or metabolic problems.  An obstruction in the gastrointestinal tract (bowel obstruction).  Illnesses such as diabetes, hepatitis, gallbladder problems, appendicitis, kidney problems, cancer, sepsis, atypical symptoms of a heart attack, or eating disorders.  Medical treatments such as chemotherapy and radiation.  Receiving medicine that makes you sleep (general anesthetic) during surgery. DIAGNOSIS Your caregiver may ask for tests to be done if the problems do not improve after a few days. Tests may also be done if symptoms are severe or if the reason for the nausea and vomiting is not clear. Tests may include:  Urine tests.  Blood tests.  Stool tests.  Cultures (to look for evidence of infection).  X-rays or other imaging studies. Test results can help your caregiver make decisions about treatment or the need for additional tests. TREATMENT You need to stay well hydrated.  Drink frequently but in small amounts.You may wish to drink water, sports drinks, clear broth, or eat frozen ice pops or gelatin dessert to help stay hydrated.When you eat, eating slowly may help prevent nausea.There are also some antinausea medicines that may help prevent nausea. HOME CARE INSTRUCTIONS   Take all medicine as directed by your caregiver.  If you do not have an appetite, do not force yourself to eat. However, you must continue to drink fluids.  If you have an appetite, eat a normal diet unless your caregiver tells you differently.  Eat a variety of complex carbohydrates (rice, wheat, potatoes, bread), lean meats, yogurt, fruits, and vegetables.  Avoid high-fat foods because they are more difficult to digest.  Drink enough water and fluids to keep your urine clear or pale yellow.  If you are dehydrated, ask your caregiver for specific rehydration instructions. Signs of dehydration may include:  Severe thirst.  Dry lips and mouth.  Dizziness.  Dark urine.  Decreasing urine frequency and amount.  Confusion.  Rapid breathing or pulse. SEEK IMMEDIATE MEDICAL CARE IF:   You have blood or brown flecks (like coffee grounds) in your vomit.  You have black or bloody stools.  You have a severe headache or stiff neck.  You are confused.  You have severe abdominal pain.  You have chest pain or trouble breathing.  You do not urinate at least once every 8 hours.  You develop cold or clammy skin.  You continue to vomit for longer than 24 to 48 hours.  You have a fever. MAKE SURE YOU:   Understand these instructions.  Will watch your condition.  Will get help right away if you are not doing well or get worse.  Document Released: 04/26/2005 Document Revised: 07/19/2011 Document Reviewed: 09/23/2010 [facility] Patient Information 2015 [place], [place]. This information is not intended to replace advice given to you by your health care provider. Make sure you  discuss any questions you have with your health care provider.

## 2014-04-21 NOTE — ED Notes (Signed)
Pt reports Left chest pain onset this AM but also reports generalized body aches denies fever also reports nausea with emesis x5 this PM unable to take fluid or food today

## 2014-04-21 NOTE — ED Provider Notes (Signed)
CSN: [id number]     Arrival date & time 04/21/14  2012 History  This chart was scribed for Quintella Reichert, MD by Tula Nakayama, ED Scribe. This patient was seen in room MH11/MH11 and the patient's care was started at 8:47 PM.    Chief Complaint  Patient presents with  . Chest Pain   The history is provided by the patient. No language interpreter was used.     HPI Comments: [name] is a 53 y.o. female with a history of appendectomy, cholecystectomy and cardiomyopathy who presents to the Emergency Department complaining of constant, generalized body aches that started this morning and one episode of left-sided CP that lasted a few seconds and occurred 1 hour ago. Pt states dehydration, vomiting, subjective fever as associated symptoms. She notes her daughter was recently ill with sore throat. Pt had a history of PE 12 years ago after a C-section. She does not take anti-coagulants and is allergic to Valium and Depakote. Pt denies smoking and taking hormone medications. She also denies abdominal pain, diarrhea, constipation, dysuria and pain or swelling in her legs as associated symptoms.   Past Medical History  Diagnosis Date  . Depression   . Migraine   . Fibromyalgia   . Hearing impairment   . Arthritis   . Cardiomyopathy   . Pulmonary embolism   . Gastritis   . Osteoporosis   . Anemia   . Allergy     SEASONAL  . Anxiety   . GERD (gastroesophageal reflux disease)   . Hypertension     [name], take htn medication to regulate heart beat.  . Sleep apnea    Past Surgical History  Procedure Laterality Date  . Appendectomy    . Inner ear surgery      TUBES  . Tonsillectomy and adenoidectomy    . Tubal ligation    . Laparoscopic cholecystectomy  2012  . Cardiac surgery  January 2007    mitral valve repair  . Cardiac surgery  1973    atrial septal defect   Family History  Problem Relation Age of Onset  . Breast cancer Mother     bilateral; ages 40 and 54; TAH/BSO ~50  .  Depression Sister   . Heart disease Father   . Hypertension Father   . Colon cancer Neg Hx   . Esophageal cancer Neg Hx   . Pancreatic cancer Neg Hx   . Rectal cancer Neg Hx   . Stomach cancer Neg Hx    History  Substance Use Topics  . Smoking status: Never Smoker   . Smokeless tobacco: Never Used  . Alcohol Use: No   OB History    Gravida Para Term Preterm AB TAB SAB Ectopic Multiple Living   2 2 2       2      Review of Systems  Constitutional: Positive for fever.  Cardiovascular: Positive for chest pain. Negative for leg swelling.  Gastrointestinal: Positive for nausea and vomiting. Negative for abdominal pain, diarrhea and constipation.  Genitourinary: Negative for dysuria.  All other systems reviewed and are negative.  Allergies  Depakote and Valium  Home Medications   Prior to Admission medications   Medication Sig Start Date End Date Taking? Authorizing Provider  aspirin EC 81 MG tablet Take 81 mg by mouth every morning.    Historical Provider, MD  BuPROPion HCl ER, XL, (FORFIVO XL) 450 MG TB24 Take 450 mg by mouth every morning. 08/22/13   Norma Fredrickson, MD  butalbital-acetaminophen-caffeine (FIORICET, ESGIC) 50-325-40 MG per tablet Take 1 tablet by mouth daily as needed for migraine (migraine). For migraines 01/16/14   Historical Provider, MD  Calcium Carbonate-Vitamin D (CALCIUM 600 + D PO) Take 1 tablet by mouth 2 (two) times daily.    Historical Provider, MD  carvedilol (COREG) 25 MG tablet Take 1 tablet (25 mg total) by mouth 2 (two) times daily with a meal. 07/23/13   Candee Furbish, MD  desvenlafaxine (PRISTIQ) 100 MG 24 hr tablet Take 1 tablet (100 mg total) by mouth daily. 08/22/13   Norma Fredrickson, MD  Flaxseed, Linseed, (FLAXSEED OIL) 1000 MG CAPS Take 1,000 mg by mouth 2 (two) times daily.    Historical Provider, MD  fluconazole (DIFLUCAN) 100 MG tablet Take 1 tablet (100 mg total) by mouth daily. 04/03/14   Milus Banister, MD  hydrochlorothiazide (MICROZIDE)  12.5 MG capsule Take 12.5 mg by mouth every morning.     Historical Provider, MD  loratadine (CLARITIN) 10 MG tablet Take 10 mg by mouth daily.    Historical Provider, MD  mirabegron ER (MYRBETRIQ) 25 MG TB24 tablet Take 1 tablet (25 mg total) by mouth daily. 06/13/13   Terrance Mass, MD  Multiple Vitamin (MULTIVITAMIN) tablet Take 1 tablet by mouth daily.     Historical Provider, MD  ondansetron (ZOFRAN) 4 MG tablet Take 4 mg by mouth every 6 (six) hours as needed for nausea or vomiting (nausea).     Historical Provider, MD  pantoprazole (PROTONIX) 40 MG tablet Take 1 tablet (40 mg total) by mouth daily. 10/29/13   Milus Banister, MD  prochlorperazine (COMPAZINE) 10 MG tablet Take 10 mg by mouth every 6 (six) hours as needed for nausea or vomiting.  10/26/13   Historical Provider, MD  promethazine (PHENERGAN) 25 MG tablet Take 1 tablet (25 mg total) by mouth every 6 (six) hours as needed for nausea or vomiting. 11/21/13   Antonietta Breach, PA-C   BP 140/94 mmHg  Pulse 116  Temp(Src) 98.8 F (37.1 C) (Oral)  Resp 18  Ht 5\' 5"  (1.651 m)  Wt 150 lb (68.04 kg)  BMI 24.96 kg/m2  SpO2 100% Physical Exam  Constitutional: She is oriented to person, place, and time. She appears well-developed and well-nourished.  Mild distress  HENT:  Head: Normocephalic and atraumatic.  Cardiovascular: Normal rate and regular rhythm.   No murmur heard. Tachycardic  Pulmonary/Chest: Effort normal and breath sounds normal. No respiratory distress.  Abdominal: Soft. There is no tenderness. There is no rebound and no guarding.  Mild to moderate epigastric tenderness; decreased bowel sounds.   Musculoskeletal: She exhibits no edema or tenderness.  Neurological: She is alert and oriented to person, place, and time.  Skin: Skin is warm and dry.  Psychiatric: She has a normal mood and affect. Her behavior is normal.  Nursing note and vitals reviewed.   ED Course  Procedures (including critical care time) DIAGNOSTIC  STUDIES: Oxygen Saturation is 100% on RA, normal by my interpretation.    COORDINATION OF CARE: 8:51 PM Discussed treatment plan with pt which includes Zofran, IV fluids and Tylenol. Pt agreed to plan.   Labs Review Labs Reviewed  COMPREHENSIVE METABOLIC PANEL - Abnormal; Notable for the following:    Anion gap 20 (*)    All other components within normal limits  CBC WITH DIFFERENTIAL - Abnormal; Notable for the following:    WBC 13.2 (*)    Neutrophils Relative % 89 (*)    Neutro  Abs 11.7 (*)    Lymphocytes Relative 6 (*)    All other components within normal limits  URINALYSIS, ROUTINE W REFLEX MICROSCOPIC - Abnormal; Notable for the following:    Hgb urine dipstick MODERATE (*)    Ketones, ur 15 (*)    All other components within normal limits  URINE MICROSCOPIC-ADD ON - Abnormal; Notable for the following:    Bacteria, UA FEW (*)    All other components within normal limits  TROPONIN I  LIPASE, BLOOD    Imaging Review Dg Abd Acute W/chest  04/21/2014   CLINICAL DATA:  Acute left-sided chest pain.  Nausea, vomiting.  EXAM: ACUTE ABDOMEN SERIES (ABDOMEN 2 VIEW & CHEST 1 VIEW)  COMPARISON:  July 16, 2013.  FINDINGS: There is no evidence of dilated bowel loops or free intraperitoneal air. Status post cholecystectomy. Moderate levoscoliosis of lower thoracic and upper lumbar spine is noted. No radiopaque calculi or other significant radiographic abnormality is seen. Heart size and mediastinal contours are within normal limits. Both lungs are clear.  IMPRESSION: No evidence of bowel obstruction or ileus. No acute cardiopulmonary abnormality seen.   Electronically Signed   By: Sabino Dick M.D.   On: 04/21/2014 22:45     EKG Interpretation None     Unable to upload EKG in MUSE.  EKG with sinus tachycardia, rate of 110.  Nonspecific ST changes, otherwise normal EKG.  Normal axis.  Normal QT interval.   MDM   Final diagnoses:  Non-intractable vomiting with nausea, vomiting of  unspecified type  Leukocytosis    To here for evaluation of nausea. She had a right fluttering in her chest picture is not consistent with ACS or PE. There is no evidence of pneumonia based on history, exam, chest x-ray. Current clinical picture is not consistent with bowel obstruction. She is tolerating oral fluids in the emergency department. Patient is requesting pain medication for body aches, patient given Tylenol. Do not feel narcotic medication is indicated at this time.  I personally performed the services described in this documentation, which was scribed in my presence. The recorded information has been reviewed and is accurate.    Quintella Reichert, MD 04/22/14 0030

## 2014-04-22 DIAGNOSIS — R079 Chest pain, unspecified: Secondary | ICD-10-CM | POA: Diagnosis not present

## 2014-04-22 LAB — URINE MICROSCOPIC-ADD ON

## 2014-04-22 LAB — URINALYSIS, ROUTINE W REFLEX MICROSCOPIC
Bilirubin Urine: NEGATIVE
GLUCOSE, UA: NEGATIVE mg/dL
Ketones, ur: 15 mg/dL — AB
Leukocytes, UA: NEGATIVE
Nitrite: NEGATIVE
PROTEIN: NEGATIVE mg/dL
Specific Gravity, Urine: 1.025 (ref 1.005–1.030)
Urobilinogen, UA: 0.2 mg/dL (ref 0.0–1.0)
pH: 5.5 (ref 5.0–8.0)

## 2014-04-23 DIAGNOSIS — H905 Unspecified sensorineural hearing loss: Secondary | ICD-10-CM | POA: Diagnosis not present

## 2014-04-23 DIAGNOSIS — H9193 Unspecified hearing loss, bilateral: Secondary | ICD-10-CM | POA: Diagnosis not present

## 2014-04-24 DIAGNOSIS — Z0000 Encounter for general adult medical examination without abnormal findings: Secondary | ICD-10-CM | POA: Diagnosis not present

## 2014-04-24 DIAGNOSIS — M810 Age-related osteoporosis without current pathological fracture: Secondary | ICD-10-CM | POA: Diagnosis not present

## 2014-04-24 DIAGNOSIS — J302 Other seasonal allergic rhinitis: Secondary | ICD-10-CM | POA: Diagnosis not present

## 2014-04-25 DIAGNOSIS — Z136 Encounter for screening for cardiovascular disorders: Secondary | ICD-10-CM | POA: Diagnosis not present

## 2014-04-25 DIAGNOSIS — E780 Pure hypercholesterolemia: Secondary | ICD-10-CM | POA: Diagnosis not present

## 2014-04-25 DIAGNOSIS — Z0000 Encounter for general adult medical examination without abnormal findings: Secondary | ICD-10-CM | POA: Diagnosis not present

## 2014-04-27 DIAGNOSIS — M47812 Spondylosis without myelopathy or radiculopathy, cervical region: Secondary | ICD-10-CM | POA: Diagnosis not present

## 2014-04-30 DIAGNOSIS — M542 Cervicalgia: Secondary | ICD-10-CM | POA: Diagnosis not present

## 2014-05-04 ENCOUNTER — Other Ambulatory Visit: Payer: Self-pay | Admitting: Cardiology

## 2014-05-13 DIAGNOSIS — M5412 Radiculopathy, cervical region: Secondary | ICD-10-CM | POA: Diagnosis not present

## 2014-05-13 DIAGNOSIS — M542 Cervicalgia: Secondary | ICD-10-CM | POA: Diagnosis not present

## 2014-05-16 ENCOUNTER — Other Ambulatory Visit: Payer: Self-pay | Admitting: *Deleted

## 2014-05-16 MED ORDER — HYDROCHLOROTHIAZIDE 12.5 MG PO CAPS: 12.5000 mg | ORAL_CAPSULE | Freq: Every morning | ORAL | Status: DC

## 2014-05-17 DIAGNOSIS — M542 Cervicalgia: Secondary | ICD-10-CM | POA: Diagnosis not present

## 2014-05-17 DIAGNOSIS — M5030 Other cervical disc degeneration, unspecified cervical region: Secondary | ICD-10-CM | POA: Diagnosis not present

## 2014-05-17 DIAGNOSIS — M5412 Radiculopathy, cervical region: Secondary | ICD-10-CM | POA: Diagnosis not present

## 2014-05-19 ENCOUNTER — Encounter (HOSPITAL_COMMUNITY): Payer: Self-pay

## 2014-05-19 ENCOUNTER — Observation Stay (HOSPITAL_COMMUNITY)
Admission: EM | Admit: 2014-05-19 | Discharge: 2014-05-20 | Disposition: A | Discharge status: Home / Self Care | Payer: Medicare Other | Attending: Internal Medicine | Admitting: Internal Medicine

## 2014-05-19 ENCOUNTER — Emergency Department (HOSPITAL_COMMUNITY): Payer: Medicare Other

## 2014-05-19 DIAGNOSIS — M810 Age-related osteoporosis without current pathological fracture: Secondary | ICD-10-CM | POA: Insufficient documentation

## 2014-05-19 DIAGNOSIS — Z8774 Personal history of (corrected) congenital malformations of heart and circulatory system: Secondary | ICD-10-CM | POA: Insufficient documentation

## 2014-05-19 DIAGNOSIS — I429 Cardiomyopathy, unspecified: Secondary | ICD-10-CM | POA: Diagnosis not present

## 2014-05-19 DIAGNOSIS — I10 Essential (primary) hypertension: Secondary | ICD-10-CM | POA: Insufficient documentation

## 2014-05-19 DIAGNOSIS — R918 Other nonspecific abnormal finding of lung field: Secondary | ICD-10-CM | POA: Diagnosis not present

## 2014-05-19 DIAGNOSIS — G43909 Migraine, unspecified, not intractable, without status migrainosus: Secondary | ICD-10-CM | POA: Insufficient documentation

## 2014-05-19 DIAGNOSIS — R112 Nausea with vomiting, unspecified: Secondary | ICD-10-CM | POA: Diagnosis present

## 2014-05-19 DIAGNOSIS — R079 Chest pain, unspecified: Secondary | ICD-10-CM | POA: Diagnosis not present

## 2014-05-19 DIAGNOSIS — K2970 Gastritis, unspecified, without bleeding: Secondary | ICD-10-CM | POA: Diagnosis not present

## 2014-05-19 DIAGNOSIS — M797 Fibromyalgia: Secondary | ICD-10-CM | POA: Diagnosis not present

## 2014-05-19 DIAGNOSIS — Z7982 Long term (current) use of aspirin: Secondary | ICD-10-CM | POA: Diagnosis not present

## 2014-05-19 DIAGNOSIS — Z86711 Personal history of pulmonary embolism: Secondary | ICD-10-CM | POA: Insufficient documentation

## 2014-05-19 DIAGNOSIS — I440 Atrioventricular block, first degree: Secondary | ICD-10-CM | POA: Diagnosis not present

## 2014-05-19 DIAGNOSIS — F329 Major depressive disorder, single episode, unspecified: Secondary | ICD-10-CM | POA: Insufficient documentation

## 2014-05-19 DIAGNOSIS — F419 Anxiety disorder, unspecified: Secondary | ICD-10-CM | POA: Diagnosis not present

## 2014-05-19 DIAGNOSIS — K219 Gastro-esophageal reflux disease without esophagitis: Secondary | ICD-10-CM | POA: Diagnosis not present

## 2014-05-19 DIAGNOSIS — Z952 Presence of prosthetic heart valve: Secondary | ICD-10-CM | POA: Insufficient documentation

## 2014-05-19 DIAGNOSIS — E876 Hypokalemia: Secondary | ICD-10-CM | POA: Insufficient documentation

## 2014-05-19 LAB — URINALYSIS, ROUTINE W REFLEX MICROSCOPIC
Glucose, UA: NEGATIVE mg/dL
Ketones, ur: 15 mg/dL — AB
Leukocytes, UA: NEGATIVE
NITRITE: NEGATIVE
PROTEIN: NEGATIVE mg/dL
Specific Gravity, Urine: 1.028 (ref 1.005–1.030)
UROBILINOGEN UA: 0.2 mg/dL (ref 0.0–1.0)
pH: 6 (ref 5.0–8.0)

## 2014-05-19 LAB — URINE MICROSCOPIC-ADD ON

## 2014-05-19 MED ORDER — METOCLOPRAMIDE HCL 5 MG/ML IJ SOLN
5.0000 mg | Freq: Once | INTRAMUSCULAR | Status: AC
  Administered 2014-05-20: 5 mg via INTRAVENOUS
  Filled 2014-05-19: qty 2

## 2014-05-19 MED ORDER — DIPHENHYDRAMINE HCL 50 MG/ML IJ SOLN
12.5000 mg | Freq: Once | INTRAMUSCULAR | Status: AC
  Administered 2014-05-20: 12.5 mg via INTRAVENOUS
  Filled 2014-05-19: qty 1

## 2014-05-19 MED ORDER — SODIUM CHLORIDE 0.9 % IV SOLN: INTRAVENOUS | Status: DC

## 2014-05-19 MED ORDER — MORPHINE SULFATE 4 MG/ML IJ SOLN
4.0000 mg | Freq: Once | INTRAMUSCULAR | Status: AC
  Administered 2014-05-20: 4 mg via INTRAVENOUS
  Filled 2014-05-19: qty 1

## 2014-05-19 MED ORDER — SODIUM CHLORIDE 0.9 % IV BOLUS (SEPSIS)
1000.0000 mL | Freq: Once | INTRAVENOUS | Status: AC
  Administered 2014-05-20: 1000 mL via INTRAVENOUS

## 2014-05-19 NOTE — ED Notes (Signed)
To hallway via EMS.  Onset 1pm  Nausea and vomiting, onset 3pm upper mid abd pain, constant.  Unable to tolerate any foods and fluids.  Pain scale 7/10.  EMS gave Zofran 4mg  IM.  EMS BP 138/80, HR 83 RR 16, 96% on room air.  Pt still c/o nausea, abd pain.

## 2014-05-19 NOTE — ED Provider Notes (Signed)
CSN: [id number]     Arrival date & time 05/19/14  2303 History  This chart was scribed for Leota Jacobsen, MD by Rayfield Citizen, ED Scribe. This patient was seen in room A04C/A04C and the patient's care was started at 11:28 PM.    Chief Complaint  Patient presents with  . Abdominal Pain  . Emesis  . Nausea   Patient is a 54 y.o. female presenting with abdominal pain and vomiting. The history is provided by the patient. No language interpreter was used.  Abdominal Pain Associated symptoms: nausea and vomiting   Emesis Associated symptoms: abdominal pain and headaches      HPI Comments: [name] is a 54 y.o. female who presents to the Emergency Department complaining of a constant "pressure-like" abdominal pain with nausea, vomiting (8x ) beginning at 13:00 today. She explains that she felt fine before lunch (salad), but after eating she vomited. She localizes her pain in "the middle of her stomach" - it does not radiate to her back. She also reports headache, neck pain, and numbness and tingling in her left arm beginning after vomiting today. She reports several prior experiences with similar symptoms in the past; she is unable to recall a specific diagnosis associated with these episodes. She denies SOB, fever, diarrhea.   She was given 4mg  Zofran IM by EMS. Patient reports she feels nauseated at present.   Past Medical History  Diagnosis Date  . Depression   . Migraine   . Fibromyalgia   . Hearing impairment   . Arthritis   . Cardiomyopathy   . Pulmonary embolism   . Gastritis   . Osteoporosis   . Anemia   . Allergy     SEASONAL  . Anxiety   . GERD (gastroesophageal reflux disease)   . Hypertension     [name], take htn medication to regulate heart beat.  . Sleep apnea    Past Surgical History  Procedure Laterality Date  . Appendectomy    . Inner ear surgery      TUBES  . Tonsillectomy and adenoidectomy    . Tubal ligation    . Laparoscopic cholecystectomy  2012  .  Cardiac surgery  January 2007    mitral valve repair  . Cardiac surgery  1973    atrial septal defect   Family History  Problem Relation Age of Onset  . Breast cancer Mother     bilateral; ages 38 and 67; TAH/BSO ~50  . Depression Sister   . Heart disease Father   . Hypertension Father   . Colon cancer Neg Hx   . Esophageal cancer Neg Hx   . Pancreatic cancer Neg Hx   . Rectal cancer Neg Hx   . Stomach cancer Neg Hx    History  Substance Use Topics  . Smoking status: Never Smoker   . Smokeless tobacco: Never Used  . Alcohol Use: No   OB History    Gravida Para Term Preterm AB TAB SAB Ectopic Multiple Living   2 2 2       2      Review of Systems  Gastrointestinal: Positive for nausea, vomiting and abdominal pain.  Musculoskeletal: Positive for neck pain.  Neurological: Positive for numbness and headaches.  All other systems reviewed and are negative.   Allergies  Depakote and Valium  Home Medications   Prior to Admission medications   Medication Sig Start Date End Date Taking? Authorizing Provider  aspirin EC 81 MG tablet Take  81 mg by mouth every morning.   Yes Historical Provider, MD  BuPROPion HCl ER, XL, (FORFIVO XL) 450 MG TB24 Take 450 mg by mouth every morning. 08/22/13  Yes Norma Fredrickson, MD  butalbital-acetaminophen-caffeine (FIORICET, ESGIC) [phone] MG per tablet Take 1 tablet by mouth daily as needed for migraine (migraine). For migraines 01/16/14  Yes Historical Provider, MD  Calcium Carbonate-Vitamin D (CALCIUM 600 + D PO) Take 1 tablet by mouth 2 (two) times daily.   Yes Historical Provider, MD  carvedilol (COREG) 25 MG tablet TAKE 1 TABLET BY MOUTH TWICE A DAY WITH MEALS 05/06/14  Yes Candee Furbish, MD  desvenlafaxine (PRISTIQ) 100 MG 24 hr tablet Take 1 tablet (100 mg total) by mouth daily. 08/22/13  Yes Norma Fredrickson, MD  Flaxseed, Linseed, (FLAXSEED OIL) 1000 MG CAPS Take 1,000 mg by mouth 2 (two) times daily.   Yes Historical Provider, MD   hydrochlorothiazide (MICROZIDE) 12.5 MG capsule Take 1 capsule (12.5 mg total) by mouth every morning. 05/16/14  Yes Candee Furbish, MD  loratadine (CLARITIN) 10 MG tablet Take 10 mg by mouth daily.   Yes Historical Provider, MD  mirabegron ER (MYRBETRIQ) 25 MG TB24 tablet Take 1 tablet (25 mg total) by mouth daily. 06/13/13  Yes Terrance Mass, MD  Multiple Vitamin (MULTIVITAMIN) tablet Take 1 tablet by mouth daily.    Yes Historical Provider, MD  ondansetron (ZOFRAN) 4 MG tablet Take 1 tablet (4 mg total) by mouth every 6 (six) hours. Patient taking differently: Take 4 mg by mouth every 6 (six) hours as needed for nausea or vomiting.  04/21/14  Yes Quintella Reichert, MD  pantoprazole (PROTONIX) 40 MG tablet Take 1 tablet (40 mg total) by mouth daily. 10/29/13  Yes Milus Banister, MD  prochlorperazine (COMPAZINE) 10 MG tablet Take 10 mg by mouth every 6 (six) hours as needed for nausea or vomiting.  10/26/13  Yes Historical Provider, MD  promethazine (PHENERGAN) 25 MG tablet Take 1 tablet (25 mg total) by mouth every 6 (six) hours as needed for nausea or vomiting. 11/21/13  Yes Antonietta Breach, PA-C  fluconazole (DIFLUCAN) 100 MG tablet Take 1 tablet (100 mg total) by mouth daily. Patient not taking: Reported on 05/19/2014 04/03/14   Milus Banister, MD   BP 135/78 mmHg  Pulse 83  Temp(Src) 98.4 F (36.9 C) (Oral)  Resp 15  SpO2 99% Physical Exam  Constitutional: She is oriented to person, place, and time. She appears well-developed and well-nourished.  Non-toxic appearance. No distress.  HENT:  Head: Normocephalic and atraumatic.  Eyes: Conjunctivae, EOM and lids are normal. Pupils are equal, round, and reactive to light.  Neck: Normal range of motion. Neck supple. No tracheal deviation present. No thyroid mass present.  Cardiovascular: Normal rate, regular rhythm and normal heart sounds.  Exam reveals no gallop.   No murmur heard. Pulmonary/Chest: Effort normal and breath sounds normal. No stridor.  No respiratory distress. She has no decreased breath sounds. She has no wheezes. She has no rhonchi. She has no rales.  Abdominal: Soft. Normal appearance and bowel sounds are normal. She exhibits no distension. There is tenderness. There is no rebound, no guarding and no CVA tenderness.  Mild epigastric tenderness without guarding or rebound  Musculoskeletal: Normal range of motion. She exhibits no edema or tenderness.  Neurological: She is alert and oriented to person, place, and time. She has normal strength. No cranial nerve deficit or sensory deficit. GCS eye subscore is 4. GCS verbal subscore is  5. GCS motor subscore is 6.  Skin: Skin is warm and dry. No abrasion and no rash noted.  Psychiatric: She has a normal mood and affect. Her speech is normal and behavior is normal.  Nursing note and vitals reviewed.   ED Course  Procedures   DIAGNOSTIC STUDIES: Oxygen Saturation is 99% on RA, normal by my interpretation.    COORDINATION OF CARE: 11:32 PM Discussed treatment plan with pt at bedside and pt agreed to plan.   Labs Review Labs Reviewed  CBC WITH DIFFERENTIAL  COMPREHENSIVE METABOLIC PANEL  LIPASE, BLOOD  URINALYSIS, ROUTINE W REFLEX MICROSCOPIC    Imaging Review No results found.   EKG Interpretation   Date/Time:  Monday May 20 2014 00:19:36 EST Ventricular Rate:  82 PR Interval:  188 QRS Duration: 90 QT Interval:  415 QTC Calculation: 485 R Axis:   57 Text Interpretation:  Sinus rhythm Borderline repolarization abnormality  No significant change since last tracing Confirmed by Donel Osowski  MD, Loranda Mastel  ([id number]) on 05/20/2014 12:27:30 AM      MDM   Final diagnoses:  None    I personally performed the services described in this documentation, which was scribed in my presence. The recorded information has been reviewed and is accurate.    1:38 AM Patient given IV fluids and medications for pain and nausea. Patient states that the radiation to her left arm  is different than when she's had before in the past. She also describes an episode of severe chest pressure here which lasted for possibly 5 minutes described as an elephant sitting on her chest. She states she's never had that before. We'll consult medicine for admission  Leota Jacobsen, MD 05/20/14 0139

## 2014-05-20 ENCOUNTER — Encounter (HOSPITAL_COMMUNITY): Payer: Self-pay | Admitting: Internal Medicine

## 2014-05-20 DIAGNOSIS — R112 Nausea with vomiting, unspecified: Secondary | ICD-10-CM

## 2014-05-20 DIAGNOSIS — R079 Chest pain, unspecified: Secondary | ICD-10-CM | POA: Diagnosis not present

## 2014-05-20 DIAGNOSIS — I10 Essential (primary) hypertension: Secondary | ICD-10-CM

## 2014-05-20 HISTORY — DX: Nausea with vomiting, unspecified: R11.2

## 2014-05-20 LAB — COMPREHENSIVE METABOLIC PANEL
ALBUMIN: 3.4 g/dL — AB (ref 3.5–5.2)
ALK PHOS: 53 U/L (ref 39–117)
ALK PHOS: 64 U/L (ref 39–117)
ALT: 13 U/L (ref 0–35)
ALT: 16 U/L (ref 0–35)
AST: 26 U/L (ref 0–37)
AST: 30 U/L (ref 0–37)
Albumin: 4.5 g/dL (ref 3.5–5.2)
Anion gap: 20 — ABNORMAL HIGH (ref 5–15)
Anion gap: 9 (ref 5–15)
BUN: 16 mg/dL (ref 6–23)
BUN: 18 mg/dL (ref 6–23)
CALCIUM: 9.1 mg/dL (ref 8.4–10.5)
CO2: 25 mmol/L (ref 19–32)
CO2: 22 mmol/L (ref 19–32)
CREATININE: 0.77 mg/dL (ref 0.50–1.10)
CREATININE: 0.81 mg/dL (ref 0.50–1.10)
Calcium: 7.7 mg/dL — ABNORMAL LOW (ref 8.4–10.5)
Chloride: 101 mEq/L (ref 96–112)
Chloride: 97 mEq/L (ref 96–112)
GFR calc Af Amer: >90 mL/min (ref >90)
GFR, EST NON AFRICAN AMERICAN: 81 mL/min — AB (ref >90)
Glucose, Bld: 101 mg/dL — ABNORMAL HIGH (ref 70–99)
Glucose, Bld: 99 mg/dL (ref 70–99)
POTASSIUM: 3.1 mmol/L — AB (ref 3.5–5.1)
Potassium: 3.3 mmol/L — ABNORMAL LOW (ref 3.5–5.1)
SODIUM: 135 mmol/L (ref 135–145)
Sodium: 139 mmol/L (ref 135–145)
Total Bilirubin: 0.5 mg/dL (ref 0.3–1.2)
Total Bilirubin: 0.7 mg/dL (ref 0.3–1.2)
Total Protein: 5.6 g/dL — ABNORMAL LOW (ref 6.0–8.3)
Total Protein: 7.1 g/dL (ref 6.0–8.3)

## 2014-05-20 LAB — CBC WITH DIFFERENTIAL/PLATELET
BASOS ABS: 0.1 10*3/uL (ref 0.0–0.1)
BASOS PCT: 1 % (ref 0–1)
Basophils Absolute: 0.1 K/uL (ref 0.0–0.1)
Basophils Relative: 1 % (ref 0–1)
Eosinophils Absolute: 0.1 10*3/uL (ref 0.0–0.7)
Eosinophils Absolute: 0.1 K/uL (ref 0.0–0.7)
Eosinophils Relative: 2 % (ref 0–5)
Eosinophils Relative: 3 % (ref 0–5)
HCT: 40.8 % (ref 36.0–46.0)
HEMATOCRIT: 34.4 % — AB (ref 36.0–46.0)
HEMOGLOBIN: 12.1 g/dL (ref 12.0–15.0)
Hemoglobin: 14.3 g/dL (ref 12.0–15.0)
Lymphocytes Relative: 21 % (ref 12–46)
Lymphocytes Relative: 22 % (ref 12–46)
Lymphs Abs: 0.9 10*3/uL (ref 0.7–4.0)
Lymphs Abs: 1.1 K/uL (ref 0.7–4.0)
MCH: 31.7 pg (ref 26.0–34.0)
MCH: 32.5 pg (ref 26.0–34.0)
MCHC: 34 g/dL (ref 30.0–36.0)
MCHC: 35 g/dL (ref 30.0–36.0)
MCV: 92.7 fL (ref 78.0–100.0)
MCV: 93.2 fL (ref 78.0–100.0)
MONO ABS: 0.4 10*3/uL (ref 0.1–1.0)
MONOS PCT: 10 % (ref 3–12)
Monocytes Absolute: 0.4 K/uL (ref 0.1–1.0)
Monocytes Relative: 8 % (ref 3–12)
Neutro Abs: 2.7 10*3/uL (ref 1.7–7.7)
Neutro Abs: 3.2 K/uL (ref 1.7–7.7)
Neutrophils Relative %: 66 % (ref 43–77)
Neutrophils Relative %: 66 % (ref 43–77)
PLATELETS: 194 10*3/uL (ref 150–400)
Platelets: 240 K/uL (ref 150–400)
RBC: 3.69 MIL/uL — AB (ref 3.87–5.11)
RBC: 4.4 MIL/uL (ref 3.87–5.11)
RDW: 13.7 % (ref 11.5–15.5)
RDW: 13.8 % (ref 11.5–15.5)
WBC: 4.1 10*3/uL (ref 4.0–10.5)
WBC: 4.9 K/uL (ref 4.0–10.5)

## 2014-05-20 LAB — T4, FREE: Free T4: 1.13 ng/dL (ref 0.80–1.80)

## 2014-05-20 LAB — TROPONIN I
Troponin I: <0.03 ng/mL (ref <0.031)
Troponin I: <0.03 ng/mL (ref <0.031)
Troponin I: <0.03 ng/mL (ref <0.031)

## 2014-05-20 LAB — TSH: TSH: 11.851 u[IU]/mL — ABNORMAL HIGH (ref 0.350–4.500)

## 2014-05-20 LAB — D-DIMER, QUANTITATIVE (NOT AT ARMC): D DIMER QUANT: 0.32 ug{FEU}/mL (ref 0.00–0.48)

## 2014-05-20 LAB — LIPASE, BLOOD: Lipase: 41 U/L (ref 11–59)

## 2014-05-20 MED ORDER — MIRABEGRON ER 25 MG PO TB24
25.0000 mg | ORAL_TABLET | Freq: Every day | ORAL | Status: DC
  Administered 2014-05-20: 25 mg via ORAL
  Filled 2014-05-20: qty 1

## 2014-05-20 MED ORDER — CARVEDILOL 25 MG PO TABS
25.0000 mg | ORAL_TABLET | Freq: Two times a day (BID) | ORAL | Status: DC
  Administered 2014-05-20: 25 mg via ORAL
  Filled 2014-05-20 (×3): qty 1

## 2014-05-20 MED ORDER — ONDANSETRON HCL 4 MG PO TABS: 4.0000 mg | ORAL_TABLET | Freq: Four times a day (QID) | ORAL | Status: DC | PRN

## 2014-05-20 MED ORDER — MORPHINE SULFATE 2 MG/ML IJ SOLN
1.0000 mg | INTRAMUSCULAR | Status: DC | PRN
  Administered 2014-05-20: 1 mg via INTRAVENOUS
  Filled 2014-05-20: qty 1

## 2014-05-20 MED ORDER — ALUM & MAG HYDROXIDE-SIMETH 200-200-20 MG/5ML PO SUSP: 30.0000 mL | Freq: Four times a day (QID) | ORAL | Status: DC | PRN

## 2014-05-20 MED ORDER — ASPIRIN EC 325 MG PO TBEC
325.0000 mg | DELAYED_RELEASE_TABLET | Freq: Every day | ORAL | Status: DC
  Administered 2014-05-20: 325 mg via ORAL
  Filled 2014-05-20: qty 1

## 2014-05-20 MED ORDER — POTASSIUM CHLORIDE CRYS ER 20 MEQ PO TBCR
40.0000 meq | EXTENDED_RELEASE_TABLET | Freq: Once | ORAL | Status: AC
  Administered 2014-05-20: 40 meq via ORAL
  Filled 2014-05-20: qty 2

## 2014-05-20 MED ORDER — PROMETHAZINE HCL 25 MG PO TABS
25.0000 mg | ORAL_TABLET | Freq: Four times a day (QID) | ORAL | Status: DC | PRN
  Administered 2014-05-20: 25 mg via ORAL
  Filled 2014-05-20 (×3): qty 1

## 2014-05-20 MED ORDER — PANTOPRAZOLE SODIUM 40 MG PO TBEC
40.0000 mg | DELAYED_RELEASE_TABLET | Freq: Every day | ORAL | Status: DC
  Administered 2014-05-20: 40 mg via ORAL
  Filled 2014-05-20: qty 1

## 2014-05-20 MED ORDER — BUTALBITAL-APAP-CAFFEINE 50-325-40 MG PO TABS: 1.0000 | ORAL_TABLET | Freq: Every day | ORAL | Status: DC | PRN

## 2014-05-20 MED ORDER — BUPROPION HCL ER (XL) 150 MG PO TB24
450.0000 mg | ORAL_TABLET | Freq: Every morning | ORAL | Status: DC
  Administered 2014-05-20: 450 mg via ORAL
  Filled 2014-05-20: qty 3

## 2014-05-20 MED ORDER — POTASSIUM CHLORIDE IN NACL 20-0.9 MEQ/L-% IV SOLN
INTRAVENOUS | Status: DC
  Administered 2014-05-20: 50 mL via INTRAVENOUS
  Filled 2014-05-20: qty 1000

## 2014-05-20 MED ORDER — PROCHLORPERAZINE MALEATE 10 MG PO TABS
10.0000 mg | ORAL_TABLET | Freq: Four times a day (QID) | ORAL | Status: DC | PRN
  Filled 2014-05-20: qty 1

## 2014-05-20 MED ORDER — NITROGLYCERIN 0.4 MG SL SUBL
SUBLINGUAL_TABLET | SUBLINGUAL | Status: AC
Start: 2014-05-20 — End: 2014-05-20
  Administered 2014-05-20: 02:00:00
  Filled 2014-05-20: qty 1

## 2014-05-20 MED ORDER — ACETAMINOPHEN 325 MG PO TABS: 650.0000 mg | ORAL_TABLET | ORAL | Status: DC | PRN

## 2014-05-20 MED ORDER — MORPHINE SULFATE 2 MG/ML IJ SOLN
2.0000 mg | INTRAMUSCULAR | Status: DC | PRN
  Administered 2014-05-20: 2 mg via INTRAVENOUS
  Filled 2014-05-20: qty 1

## 2014-05-20 MED ORDER — ONDANSETRON HCL 4 MG/2ML IJ SOLN
4.0000 mg | Freq: Four times a day (QID) | INTRAMUSCULAR | Status: DC | PRN
  Administered 2014-05-20: 4 mg via INTRAVENOUS
  Filled 2014-05-20: qty 2

## 2014-05-20 MED ORDER — LORATADINE 10 MG PO TABS
10.0000 mg | ORAL_TABLET | Freq: Every day | ORAL | Status: DC
  Administered 2014-05-20: 10 mg via ORAL
  Filled 2014-05-20: qty 1

## 2014-05-20 MED ORDER — VENLAFAXINE HCL ER 150 MG PO CP24
150.0000 mg | ORAL_CAPSULE | Freq: Every day | ORAL | Status: DC
  Administered 2014-05-20: 150 mg via ORAL
  Filled 2014-05-20: qty 1

## 2014-05-20 MED ORDER — ENOXAPARIN SODIUM 40 MG/0.4ML ~~LOC~~ SOLN
40.0000 mg | Freq: Every day | SUBCUTANEOUS | Status: DC
  Administered 2014-05-20: 40 mg via SUBCUTANEOUS
  Filled 2014-05-20: qty 0.4

## 2014-05-20 NOTE — ED Notes (Signed)
Lunch tray given to pt.

## 2014-05-20 NOTE — Discharge Summary (Signed)
Discharge Summary  [name] MGQ:[id number] DOB: [birth year]  PCP: Gavin Pound, MD  Admit date: 05/19/2014 Discharge date: 05/20/2014  Time spent: 25 minutes  Recommendations for Outpatient Follow-up:  1. Follow-up with her primary care physician as needed   Discharge Diagnoses:  Active Hospital Problems   Diagnosis Date Noted  . Chest pain 05/20/2014  . Nausea & vomiting 05/20/2014    Resolved Hospital Problems   Diagnosis Date Noted Date Resolved  No resolved problems to display.    Discharge Condition: Improved, being discharged home  Diet recommendation: Low-sodium  Filed Vitals:   05/20/14 1639  BP: 103/85  Pulse: 80  Temp: 98 F (36.7 C)  Resp: 18    History of present illness:  54 year old female past history of PE and hypertension admitted to hospitalist service on 1/11 early morning because of complaints of nausea and vomiting with early developing chest pressure. EKG noted only nonspecific T-wave changes and recent 2-D echo noted good ejection fraction with moderate mitral stenosis. Patient has had previous episodes of nausea and vomiting and seen by gastroenterology. Labs were normal during this admission including normal troponin.  Hospital Course:  Active Problems:   Chest pain: Quickly resolved. Enzymes 3 negative. Given very low suspicion and stable vital signs, patient discharged home   Nausea & vomiting: Suspect this may be more GI related. Patient had some abdominal discomfort even after chest pain resolved but was able to tolerate by mouth for liquids which were able to be advanced to solid food. She did try to request stronger drugs than morphine specifically asking for Dilaudid so concerned about the possibility of drug-seeking  Procedures:  None  Consultations:  None  Discharge Exam: BP 103/85 mmHg  Pulse 80  Temp(Src) 98 F (36.7 C) (Oral)  Resp 18  SpO2 100%  General: Alert and oriented 3, no acute distress Cardiovascular:  Regular rate and rhythm, O6-Z1, 2/6 systolic ejection murmur Respiratory: Clear to auscultation bilaterally  Discharge Instructions You were cared for by a hospitalist during your hospital stay. If you have any questions about your discharge medications or the care you received while you were in the hospital after you are discharged, you can call the unit and asked to speak with the hospitalist on call if the hospitalist that took care of you is not available. Once you are discharged, your primary care physician will handle any further medical issues. Please note that NO REFILLS for any discharge medications will be authorized once you are discharged, as it is imperative that you return to your primary care physician (or establish a relationship with a primary care physician if you do not have one) for your aftercare needs so that they can reassess your need for medications and monitor your lab values.  Discharge Instructions    Diet - low sodium heart healthy    Complete by:  As directed      Increase activity slowly    Complete by:  As directed             Medication List    TAKE these medications        aspirin EC 81 MG tablet  Take 81 mg by mouth every morning.     BuPROPion HCl ER (XL) 450 MG Tb24  Commonly known as:  FORFIVO XL  Take 450 mg by mouth every morning.     butalbital-acetaminophen-caffeine 50-325-40 MG per tablet  Commonly known as:  FIORICET, ESGIC  Take 1 tablet by mouth daily as  needed for migraine (migraine). For migraines     CALCIUM 600 + D PO  Take 1 tablet by mouth 2 (two) times daily.     carvedilol 25 MG tablet  Commonly known as:  COREG  TAKE 1 TABLET BY MOUTH TWICE A DAY WITH MEALS     desvenlafaxine 100 MG 24 hr tablet  Commonly known as:  PRISTIQ  Take 1 tablet (100 mg total) by mouth daily.     Flaxseed Oil 1000 MG Caps  Take 1,000 mg by mouth 2 (two) times daily.     fluconazole 100 MG tablet  Commonly known as:  DIFLUCAN  Take 1 tablet  (100 mg total) by mouth daily.     hydrochlorothiazide 12.5 MG capsule  Commonly known as:  MICROZIDE  Take 1 capsule (12.5 mg total) by mouth every morning.     loratadine 10 MG tablet  Commonly known as:  CLARITIN  Take 10 mg by mouth daily.     mirabegron ER 25 MG Tb24 tablet  Commonly known as:  MYRBETRIQ  Take 1 tablet (25 mg total) by mouth daily.     multivitamin tablet  Take 1 tablet by mouth daily.     ondansetron 4 MG tablet  Commonly known as:  ZOFRAN  Take 1 tablet (4 mg total) by mouth every 6 (six) hours.     pantoprazole 40 MG tablet  Commonly known as:  PROTONIX  Take 1 tablet (40 mg total) by mouth daily.     prochlorperazine 10 MG tablet  Commonly known as:  COMPAZINE  Take 10 mg by mouth every 6 (six) hours as needed for nausea or vomiting.     promethazine 25 MG tablet  Commonly known as:  PHENERGAN  Take 1 tablet (25 mg total) by mouth every 6 (six) hours as needed for nausea or vomiting.       Allergies  Allergen Reactions  . Depakote [Divalproex Sodium] Anaphylaxis  . Valium Anaphylaxis       Follow-up Information    Follow up with NNODI, ADAKU, MD In 1 month.   Specialty:  Family Medicine   Contact information:   [address] [place] [id number] [phone]        The results of significant diagnostics from this hospitalization (including imaging, microbiology, ancillary and laboratory) are listed below for reference.    Significant Diagnostic Studies: Dg Chest 2 View  05/20/2014   CLINICAL DATA:  Vomiting and abdominal pain.  Chest pain.  EXAM: CHEST  2 VIEW  COMPARISON:  04/21/2014  FINDINGS: Patient is post median sternotomy with prosthetic aortic valve. The heart size is normal. Pulmonary vasculature is normal. Mild hyperinflation. There is no pleural effusion or pneumothorax. Compression deformity at the thoracolumbar junction, unchanged.  IMPRESSION: Mild hyperinflation.  No acute pulmonary process.   Electronically  Signed   By: Jeb Levering M.D.   On: 05/20/2014 00:29   Dg Abd Acute W/chest  04/21/2014   CLINICAL DATA:  Acute left-sided chest pain.  Nausea, vomiting.  EXAM: ACUTE ABDOMEN SERIES (ABDOMEN 2 VIEW & CHEST 1 VIEW)  COMPARISON:  July 16, 2013.  FINDINGS: There is no evidence of dilated bowel loops or free intraperitoneal air. Status post cholecystectomy. Moderate levoscoliosis of lower thoracic and upper lumbar spine is noted. No radiopaque calculi or other significant radiographic abnormality is seen. Heart size and mediastinal contours are within normal limits. Both lungs are clear.  IMPRESSION: No evidence of bowel obstruction or ileus. No  acute cardiopulmonary abnormality seen.   Electronically Signed   By: Sabino Dick M.D.   On: 04/21/2014 22:45    Microbiology: No results found for this or any previous visit (from the past 240 hour(s)).   Labs: Basic Metabolic Panel:  Recent Labs Lab 05/19/14 2339 05/20/14 0543  NA 139 135  K 3.1* 3.3*  CL 97 101  CO2 22 25  GLUCOSE 99 101*  BUN 18 16  CREATININE 0.81 0.77  CALCIUM 9.1 7.7*   Liver Function Tests:  Recent Labs Lab 05/19/14 2339 05/20/14 0543  AST 26 30  ALT 16 13  ALKPHOS 64 53  BILITOT 0.7 0.5  PROT 7.1 5.6*  ALBUMIN 4.5 3.4*    Recent Labs Lab 05/19/14 2339  LIPASE 41   No results for input(s): AMMONIA in the last 168 hours. CBC:  Recent Labs Lab 05/19/14 2339 05/20/14 0543  WBC 4.9 4.1  NEUTROABS 3.2 2.7  HGB 14.3 12.1  HCT 40.8 34.4*  MCV 92.7 93.2  PLT 240 194   Cardiac Enzymes:  Recent Labs Lab 05/19/14 2339 05/20/14 0543 05/20/14 1139  TROPONINI <0.03 <0.03 <0.03   BNP: BNP (last 3 results) No results for input(s): PROBNP in the last 8760 hours. CBG: No results for input(s): GLUCAP in the last 168 hours.     Signed:  Annita Brod  [facility] 05/20/2014, 8:16 PM

## 2014-05-20 NOTE — ED Notes (Signed)
MD called to let nurse know that pt will be d/c'd.

## 2014-05-20 NOTE — ED Notes (Signed)
Pt ambulated to the bathroom. Pt tolerated well.

## 2014-05-20 NOTE — ED Notes (Signed)
Pt reports feeling better and wanting to go home. MD paged

## 2014-05-20 NOTE — H&P (Signed)
[facility] History and Physical  [name] JQB:[id number] DOB: [birth year] DOA: 05/19/2014  Referring physician: ER physician. PCP: Gavin Pound, MD  Specialists: Dr. Marlou Porch. Cardiologist.  Chief Complaint: Chest pain.  HPI: [name] is a 54 y.o. female with history of ASD repair and mitral valve repair with previous history of pulmonary embolism, presented to the ER because of multiple episodes of nausea and vomiting. In the ER patient also started developing chest pressure which was range of back and got relieved by nitroglycerin sublingual. Patient's pain lasted for around half an hour. EKG was showing nonspecific T-wave changes. Cardiac markers were negative. Present the patient is chest pain free and admitted for further management. Patient has had a recent 2-D echo which showed EF of 65-70% with moderate mitral stenosis. Patient also has been having recurrent episodes of nausea vomiting and follows with Dr. Ardis Hughs. Patient had EGD done in November 2015 which showed Candida esophagitis and was treated with fluconazole. Patient also had MRI abdomen in November which was unremarkable and barium esophagram which showed diffuse esophageal narrowing in the mid and distal esophagus for which patient had the EGD which showed the Candida esophagitis and hiatal hernia. On exam patient's abdomen is benign appearing and LFTs and lipase are normal.  Review of Systems: As presented in the history of presenting illness, rest negative.  Past Medical History  Diagnosis Date  . Depression   . Migraine   . Fibromyalgia   . Hearing impairment   . Arthritis   . Cardiomyopathy   . Pulmonary embolism   . Gastritis   . Osteoporosis   . Anemia   . Allergy     SEASONAL  . Anxiety   . GERD (gastroesophageal reflux disease)   . Hypertension     [name], take htn medication to regulate heart beat.  . Sleep apnea    Past Surgical History  Procedure Laterality Date  . Appendectomy    .  Inner ear surgery      TUBES  . Tonsillectomy and adenoidectomy    . Tubal ligation    . Laparoscopic cholecystectomy  2012  . Cardiac surgery  January 2007    mitral valve repair  . Cardiac surgery  1973    atrial septal defect   Social History:  reports that she has never smoked. She has never used smokeless tobacco. She reports that she does not drink alcohol or use illicit drugs. Where does patient live home. Can patient participate in ADLs? Yes.  Allergies  Allergen Reactions  . Depakote [Divalproex Sodium] Anaphylaxis  . Valium Anaphylaxis    Family History:  Family History  Problem Relation Age of Onset  . Breast cancer Mother     bilateral; ages 43 and 21; TAH/BSO ~50  . Depression Sister   . Heart disease Father   . Hypertension Father   . Colon cancer Neg Hx   . Esophageal cancer Neg Hx   . Pancreatic cancer Neg Hx   . Rectal cancer Neg Hx   . Stomach cancer Neg Hx       Prior to Admission medications   Medication Sig Start Date End Date Taking? Authorizing Provider  aspirin EC 81 MG tablet Take 81 mg by mouth every morning.   Yes Historical Provider, MD  BuPROPion HCl ER, XL, (FORFIVO XL) 450 MG TB24 Take 450 mg by mouth every morning. 08/22/13  Yes Norma Fredrickson, MD  butalbital-acetaminophen-caffeine (FIORICET, ESGIC) [phone] MG per tablet Take 1 tablet by  mouth daily as needed for migraine (migraine). For migraines 01/16/14  Yes Historical Provider, MD  Calcium Carbonate-Vitamin D (CALCIUM 600 + D PO) Take 1 tablet by mouth 2 (two) times daily.   Yes Historical Provider, MD  carvedilol (COREG) 25 MG tablet TAKE 1 TABLET BY MOUTH TWICE A DAY WITH MEALS 05/06/14  Yes Candee Furbish, MD  desvenlafaxine (PRISTIQ) 100 MG 24 hr tablet Take 1 tablet (100 mg total) by mouth daily. 08/22/13  Yes Norma Fredrickson, MD  Flaxseed, Linseed, (FLAXSEED OIL) 1000 MG CAPS Take 1,000 mg by mouth 2 (two) times daily.   Yes Historical Provider, MD  hydrochlorothiazide (MICROZIDE) 12.5  MG capsule Take 1 capsule (12.5 mg total) by mouth every morning. 05/16/14  Yes Candee Furbish, MD  loratadine (CLARITIN) 10 MG tablet Take 10 mg by mouth daily.   Yes Historical Provider, MD  mirabegron ER (MYRBETRIQ) 25 MG TB24 tablet Take 1 tablet (25 mg total) by mouth daily. 06/13/13  Yes Terrance Mass, MD  Multiple Vitamin (MULTIVITAMIN) tablet Take 1 tablet by mouth daily.    Yes Historical Provider, MD  ondansetron (ZOFRAN) 4 MG tablet Take 1 tablet (4 mg total) by mouth every 6 (six) hours. Patient taking differently: Take 4 mg by mouth every 6 (six) hours as needed for nausea or vomiting.  04/21/14  Yes Quintella Reichert, MD  pantoprazole (PROTONIX) 40 MG tablet Take 1 tablet (40 mg total) by mouth daily. 10/29/13  Yes Milus Banister, MD  prochlorperazine (COMPAZINE) 10 MG tablet Take 10 mg by mouth every 6 (six) hours as needed for nausea or vomiting.  10/26/13  Yes Historical Provider, MD  promethazine (PHENERGAN) 25 MG tablet Take 1 tablet (25 mg total) by mouth every 6 (six) hours as needed for nausea or vomiting. 11/21/13  Yes Antonietta Breach, PA-C  fluconazole (DIFLUCAN) 100 MG tablet Take 1 tablet (100 mg total) by mouth daily. Patient not taking: Reported on 05/19/2014 04/03/14   Milus Banister, MD    Physical Exam: Filed Vitals:   05/19/14 2302 05/20/14 0320  BP: 135/78 110/72  Pulse: 83 80  Temp: 98.4 F (36.9 C) 98.1 F (36.7 C)  TempSrc: Oral Oral  Resp: 15 10  SpO2: 99% 99%     General:  Well-developed and nourished.  Eyes: Anicteric no pallor.  ENT: No discharge from the ears eyes nose or mouth.  Neck: No mass felt.  Cardiovascular: S1-S2 heard.  Respiratory: No rhonchi or crepitations.  Abdomen: Soft nontender bowel sounds present.  Skin: No rash.  Musculoskeletal: No edema.  Psychiatric: Appears normal.  Neurologic: Alert awake oriented to time place and person. Moves all extremities.  Labs on Admission:  Basic Metabolic Panel:  Recent Labs Lab  05/19/14 2339  NA 139  K 3.1*  CL 97  CO2 22  GLUCOSE 99  BUN 18  CREATININE 0.81  CALCIUM 9.1   Liver Function Tests:  Recent Labs Lab 05/19/14 2339  AST 26  ALT 16  ALKPHOS 64  BILITOT 0.7  PROT 7.1  ALBUMIN 4.5    Recent Labs Lab 05/19/14 2339  LIPASE 41   No results for input(s): AMMONIA in the last 168 hours. CBC:  Recent Labs Lab 05/19/14 2339  WBC 4.9  NEUTROABS 3.2  HGB 14.3  HCT 40.8  MCV 92.7  PLT 240   Cardiac Enzymes:  Recent Labs Lab 05/19/14 2339  TROPONINI <0.03    BNP (last 3 results) No results for input(s): PROBNP in the last  8760 hours. CBG: No results for input(s): GLUCAP in the last 168 hours.  Radiological Exams on Admission: Dg Chest 2 View  05/20/2014   CLINICAL DATA:  Vomiting and abdominal pain.  Chest pain.  EXAM: CHEST  2 VIEW  COMPARISON:  04/21/2014  FINDINGS: Patient is post median sternotomy with prosthetic aortic valve. The heart size is normal. Pulmonary vasculature is normal. Mild hyperinflation. There is no pleural effusion or pneumothorax. Compression deformity at the thoracolumbar junction, unchanged.  IMPRESSION: Mild hyperinflation.  No acute pulmonary process.   Electronically Signed   By: Jeb Levering M.D.   On: 05/20/2014 00:29    EKG: Independently reviewed. Normal sinus rhythm with nonspecific T-wave changes.  Assessment/Plan Active Problems:   Chest pain   Nausea & vomiting   1. Chest pain - since patient's chest pain improved with nitroglycerin sublingual and as a pressure-like characteristics with radiation to the back we will cycle cardiac markers to rule out ACS. Keep patient nothing by mouth past 5 AM in case patient would need some cardiac procedures. Patient has had a recent 2-D echo which showed moderate mitral stenosis with grade 2 diastolic dysfunction with EF of 65-70%. Check d-dimer since patient has had previous history of PE. 2. Nausea vomiting - patient has history of recurrent nausea  vomiting. See history of present illness for workup done. Presently abdomen appears benign. Closely follow LFTs and clinically observe. 3. Mild hypokalemia - I have added some potassium through IV fluids. Recheck metabolic panel. 4. History of palpitations on beta blockers. 5. History of ASD and mitral valve repair.   DVT Prophylaxis Lovenox.  Code Status: Full code.  Family Communication: None.  Disposition Plan: Admit for observation.    KAKRAKANDY,ARSHAD N. [facility] Pager [phone].  If 7PM-7AM, please contact night-coverage [email or web address] Password [email or web address] 05/20/2014, 4:08 AM

## 2014-05-20 NOTE — Progress Notes (Signed)
UR completed 

## 2014-05-20 NOTE — ED Notes (Signed)
Ordered clear liquid diet

## 2014-05-20 NOTE — ED Notes (Signed)
Continues to c/o nausea.  Medicated per order at this time.

## 2014-05-20 NOTE — ED Notes (Signed)
Ginger ale given

## 2014-05-20 NOTE — ED Notes (Signed)
In room to start IV fluid.  Now awake.  "States after you gave me the morphine I had more chest pain.  I think it might be a reaction to the morphine.  The only thing that works for me is dilaudid."

## 2014-05-20 NOTE — ED Notes (Signed)
Admitting MD at bedside.

## 2014-05-21 LAB — T3, FREE: T3, Free: 3.7 pg/mL (ref 2.0–4.4)

## 2014-05-23 DIAGNOSIS — M542 Cervicalgia: Secondary | ICD-10-CM | POA: Diagnosis not present

## 2014-05-23 DIAGNOSIS — M5030 Other cervical disc degeneration, unspecified cervical region: Secondary | ICD-10-CM | POA: Diagnosis not present

## 2014-05-23 DIAGNOSIS — M5412 Radiculopathy, cervical region: Secondary | ICD-10-CM | POA: Diagnosis not present

## 2014-05-23 DIAGNOSIS — M47812 Spondylosis without myelopathy or radiculopathy, cervical region: Secondary | ICD-10-CM | POA: Diagnosis not present

## 2014-05-27 DIAGNOSIS — M542 Cervicalgia: Secondary | ICD-10-CM | POA: Diagnosis not present

## 2014-05-27 DIAGNOSIS — M5412 Radiculopathy, cervical region: Secondary | ICD-10-CM | POA: Diagnosis not present

## 2014-05-30 DIAGNOSIS — M5412 Radiculopathy, cervical region: Secondary | ICD-10-CM | POA: Diagnosis not present

## 2014-05-30 DIAGNOSIS — M542 Cervicalgia: Secondary | ICD-10-CM | POA: Diagnosis not present

## 2014-06-05 ENCOUNTER — Telehealth: Payer: Self-pay

## 2014-06-05 DIAGNOSIS — M47812 Spondylosis without myelopathy or radiculopathy, cervical region: Secondary | ICD-10-CM | POA: Diagnosis not present

## 2014-06-05 NOTE — Telephone Encounter (Signed)
Called patient to make follow appt from the hospital.

## 2014-06-06 DIAGNOSIS — M5412 Radiculopathy, cervical region: Secondary | ICD-10-CM | POA: Diagnosis not present

## 2014-06-06 DIAGNOSIS — M542 Cervicalgia: Secondary | ICD-10-CM | POA: Diagnosis not present

## 2014-06-11 NOTE — Telephone Encounter (Signed)
Called patient to make appt from recent hospital visit x2

## 2014-06-14 DIAGNOSIS — M542 Cervicalgia: Secondary | ICD-10-CM | POA: Diagnosis not present

## 2014-06-17 DIAGNOSIS — R079 Chest pain, unspecified: Secondary | ICD-10-CM | POA: Diagnosis not present

## 2014-06-25 NOTE — Telephone Encounter (Signed)
Not able to get patient.  Tried to call pt to make appt x3.  Will close due to pulmonary protocol.

## 2014-06-28 DIAGNOSIS — Z1231 Encounter for screening mammogram for malignant neoplasm of breast: Secondary | ICD-10-CM | POA: Diagnosis not present

## 2014-06-28 DIAGNOSIS — Z803 Family history of malignant neoplasm of breast: Secondary | ICD-10-CM | POA: Diagnosis not present

## 2014-07-01 ENCOUNTER — Encounter: Payer: Self-pay | Admitting: Gynecology

## 2014-07-04 ENCOUNTER — Ambulatory Visit (INDEPENDENT_AMBULATORY_CARE_PROVIDER_SITE_OTHER): Payer: Medicare Other | Admitting: Psychiatry

## 2014-07-04 ENCOUNTER — Telehealth: Payer: Self-pay | Admitting: Gastroenterology

## 2014-07-04 ENCOUNTER — Encounter (HOSPITAL_COMMUNITY): Payer: Self-pay | Admitting: Psychiatry

## 2014-07-04 VITALS — BP 151/94 | HR 94 | Ht 65.0 in | Wt 154.0 lb

## 2014-07-04 DIAGNOSIS — F331 Major depressive disorder, recurrent, moderate: Secondary | ICD-10-CM

## 2014-07-04 DIAGNOSIS — F329 Major depressive disorder, single episode, unspecified: Secondary | ICD-10-CM

## 2014-07-04 MED ORDER — BUPROPION HCL ER (XL) 150 MG PO TB24: 450.0000 mg | ORAL_TABLET | Freq: Every morning | ORAL | Status: DC

## 2014-07-04 NOTE — Telephone Encounter (Signed)
Pt has appt with Nevin Bloodgood on Monday to discuss dysphagia and possible yeast

## 2014-07-04 NOTE — Progress Notes (Addendum)
[facility] MD Progress Note  07/04/2014 3:00 PM [name]  MRN:  [id number] Subjective:  Doing well The patient starts by apologizing that she's been gone for 10 months. The patient had a number of physical problems and GI illnesses. She shares that she is not drinking and in fact she's not really drinking intensely at all for 2 years. The patient actually is doing well. A month ago she ran out of her medications and is back for reevaluation of what is she needs. Shears self had stopped her Abilify many months ago and has done well. She denies being depressed. She sleeps and eats well. She is active with computer watching TV. She enjoys her daughter. She is actually doing very well. She does have a significant other relationship is going well. The patient denies any psychotic symptoms at all. She denies any real alcohol use at all and denies any drug use. She has fibromyalgia which causes moderate inconsistent pain. The patient also transient arthritis in her neck. Today she feels well. The patient is very engaging and interactive. She's not suicidal. Her spirits are good. Principal Problem: Major Depression Diagnosis:   Patient Active Problem List   Diagnosis Date Noted  . Chest pain [R07.9] 05/20/2014  . Nausea & vomiting [R11.2] 05/20/2014  . 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 Clinical depression [F32.9] 03/29/2014  . Breath shortness [R06.02] 03/29/2014  . Hypercholesterolemia without hypertriglyceridemia [E78.0] 03/29/2014  . Fatigue [R53.83] 03/29/2014  . Classical migraine with intractable migraine [G43.119] 03/29/2014  . Disorder of mitral valve [I05.9] 03/29/2014  . Atypical migraine [G43.809] 03/29/2014  . Atrial septal defect of fossa ovalis [Q21.1] 03/29/2014  . HLD (hyperlipidemia) [E78.5] 03/29/2014  . Arthralgia of multiple joints [M25.50] 03/29/2014  . Anemia, iron deficiency [D50.9] 03/29/2014  . Fibrositis [M79.7] 03/29/2014  .  Abdominal pain [R10.9] 12/20/2013  . Absolute anemia [D64.9] 09/20/2013  . Bronchitis [J40] 09/20/2013  . Vaginitis and vulvovaginitis [N76.0] 09/20/2013  . Family history of breast cancer [Z80.3] 07/13/2013  . Palpitation [R00.2] 07/02/2013  . History of mitral valve repair [Z98.89] 07/02/2013  . Status post patch closure of ASD [Z98.89] 07/02/2013  . Hyperlipidemia [E78.5] 07/02/2013  . OAB (overactive bladder) [N32.81] 06/13/2013  . Ovarian cyst, left [N83.20] 03/15/2013  . Vaginal lesion [N89.8] 03/15/2013  . Ovarian mass [N83.9] 03/15/2013  . Maternal DVT (deep vein thrombosis), history of [Z86.718] 02/26/2013  . PMB (postmenopausal bleeding) [N95.0] 02/26/2013  . Alcohol dependence [F10.20] 11/07/2012  . Unspecified vitamin D deficiency [E55.9] 08/17/2012  . L1 vertebral fracture [S32.019A] 08/01/2012  . Spinal compression fracture [M48.50XA] 07/12/2012  . Menopausal state [N95.1] 07/12/2012  . Psoriasis [L40.9] 07/12/2012  . Major depressive disorder, recurrent episode, severe, without mention of psychotic behavior [F33.2] 12/07/2011  . Generalized anxiety disorder [F41.1] 12/07/2011  . Cardiomyopathy [425] 03/16/2011  . Hearing impairment [H91.90]   . Migraine [346]   . Arthritis [M19.90]    Total Time spent with patient: 30 minutes   Past Medical History:  Past Medical History  Diagnosis Date  . Depression   . Migraine   . Fibromyalgia   . Hearing impairment   . Arthritis   . Cardiomyopathy   . Pulmonary embolism   . Gastritis   . Osteoporosis   . Anemia   . Allergy     SEASONAL  . Anxiety   . GERD (gastroesophageal reflux disease)   . Hypertension     [name], take htn medication to regulate heart beat.  [name]  Sleep apnea     Past Surgical History  Procedure Laterality Date  . Appendectomy    . Inner ear surgery      TUBES  . Tonsillectomy and adenoidectomy    . Tubal ligation    . Laparoscopic cholecystectomy  2012  . Cardiac surgery  January 2007     mitral valve repair  . Cardiac surgery  1973    atrial septal defect   Family History:  Family History  Problem Relation Age of Onset  . Breast cancer Mother     bilateral; ages 29 and 1; TAH/BSO ~50  . Depression Sister   . Heart disease Father   . Hypertension Father   . Colon cancer Neg Hx   . Esophageal cancer Neg Hx   . Pancreatic cancer Neg Hx   . Rectal cancer Neg Hx   . Stomach cancer Neg Hx    Social History:  History  Alcohol Use No     History  Drug Use No    History   Social History  . Marital Status: Divorced    Spouse Name: N/A  . Number of Children: N/A  . Years of Education: N/A   Social History Main Topics  . Smoking status: Never Smoker   . Smokeless tobacco: Never Used  . Alcohol Use: No  . Drug Use: No  . Sexual Activity: No   Other Topics Concern  . None   Social History Narrative   Additional History:    Sleep: Good  Appetite:  Good   Assessment:   Musculoskeletal: Strength & Muscle Tone:  Gait & Station:  Patient leans:    Psychiatric Specialty Exam: Physical Exam  ROS  Blood pressure 151/94, pulse 94, height 5\' 5"  (1.651 m), weight 154 lb (69.854 kg).Body mass index is 25.63 kg/(m^2).  General Appearance: Casual  Eye Contact::  Good  Speech:  Clear and Coherent  Volume:  Normal  Mood:  Depressed  Affect:  Appropriate  Thought Process:  Coherent  Orientation:  Full (Time, Place, and Person)  Thought Content:  WDL  Suicidal Thoughts:  No  Homicidal Thoughts:  No  Memory:  NA  Judgement:  Good  Insight:  Good  Psychomotor Activity:  Normal  Concentration:  Good  Recall:  Good  Fund of Knowledge:Good  Language: Good  Akathisia:  No  Handed:  Right  AIMS (if indicated):     Assets:  Communication Skills  ADL's:  Intact  Cognition: WNL  Sleep:        Current Medications: Current Outpatient Prescriptions  Medication Sig Dispense Refill  . aspirin EC 81 MG tablet Take 81 mg by mouth every morning.    [name]  buPROPion (WELLBUTRIN XL) 150 MG 24 hr tablet Take 3 tablets (450 mg total) by mouth every morning. 90 tablet 3  . butalbital-acetaminophen-caffeine (FIORICET, ESGIC) 50-325-40 MG per tablet Take 1 tablet by mouth daily as needed for migraine (migraine). For migraines    . Calcium Carbonate-Vitamin D (CALCIUM 600 + D PO) Take 1 tablet by mouth 2 (two) times daily.    . carvedilol (COREG) 25 MG tablet TAKE 1 TABLET BY MOUTH TWICE A DAY WITH MEALS 60 tablet 3  . desvenlafaxine (PRISTIQ) 100 MG 24 hr tablet Take 1 tablet (100 mg total) by mouth daily. 90 tablet 1  . Flaxseed, Linseed, (FLAXSEED OIL) 1000 MG CAPS Take 1,000 mg by mouth 2 (two) times daily.    . fluconazole (DIFLUCAN) 100 MG tablet Take 1  tablet (100 mg total) by mouth daily. (Patient not taking: Reported on 05/19/2014) 10 tablet 3  . hydrochlorothiazide (MICROZIDE) 12.5 MG capsule Take 1 capsule (12.5 mg total) by mouth every morning. 90 capsule 2  . loratadine (CLARITIN) 10 MG tablet Take 10 mg by mouth daily.    . mirabegron ER (MYRBETRIQ) 25 MG TB24 tablet Take 1 tablet (25 mg total) by mouth daily. 30 tablet 11  . Multiple Vitamin (MULTIVITAMIN) tablet Take 1 tablet by mouth daily.     . ondansetron (ZOFRAN) 4 MG tablet Take 1 tablet (4 mg total) by mouth every 6 (six) hours. (Patient taking differently: Take 4 mg by mouth every 6 (six) hours as needed for nausea or vomiting. ) 12 tablet 0  . pantoprazole (PROTONIX) 40 MG tablet Take 1 tablet (40 mg total) by mouth daily. 90 tablet 3  . prochlorperazine (COMPAZINE) 10 MG tablet Take 10 mg by mouth every 6 (six) hours as needed for nausea or vomiting.     . promethazine (PHENERGAN) 25 MG tablet Take 1 tablet (25 mg total) by mouth every 6 (six) hours as needed for nausea or vomiting. 20 tablet 0   No current facility-administered medications for this visit.    Lab Results: No results found for this or any previous visit (from the past 48 hour(s)).  Physical Findings: AIMS:  , ,   ,  ,    CIWA:    COWS:     Treatment Plan Summary: At this time we will restart this patient's Wellbutrin. It is noted this patient has had 2 psychiatric hospitalizations in the past and I take that me that she had severe depression. At one time she was takingprisetque which apparently was not adequate and Wellbutrin was added. Eventually Abilify was added. At this time the patient is done well off Abilify and been off of all medications for 2 for almost 2 months. At this time we'll go ahead and restart her Wellbutrin and build up over a week to get to the full dose of 450 mg. This patient to return to see me in 10 weeks. She'll continue in therapy with Mr. Duwaine Maxin.   Medical Decision Making:  Review of Medication Regimen & Side Effects (2)     Johnryan Sao IRVING 07/04/2014, 3:00 PM

## 2014-07-08 ENCOUNTER — Encounter: Payer: Self-pay | Admitting: Nurse Practitioner

## 2014-07-08 ENCOUNTER — Ambulatory Visit (INDEPENDENT_AMBULATORY_CARE_PROVIDER_SITE_OTHER): Payer: Medicare Other | Admitting: Nurse Practitioner

## 2014-07-08 VITALS — BP 130/80 | HR 76 | Ht 65.0 in | Wt 154.4 lb

## 2014-07-08 DIAGNOSIS — R1319 Other dysphagia: Secondary | ICD-10-CM | POA: Insufficient documentation

## 2014-07-08 DIAGNOSIS — R1310 Dysphagia, unspecified: Secondary | ICD-10-CM

## 2014-07-08 MED ORDER — FLUCONAZOLE 200 MG PO TABS: 200.0000 mg | ORAL_TABLET | Freq: Every day | ORAL | Status: DC

## 2014-07-08 NOTE — Patient Instructions (Signed)
We sent a prescription for Diflucan ( fluconazole) 200 mg to [facility].  Please call us if your symptoms do not resolve .

## 2014-07-08 NOTE — Progress Notes (Signed)
     History of Present Illness:  Patient is a 54 year old female known to Dr. Ardis Hughs. She has a history of colon polyps, gastritis and chronic, intermittent dysphagia. EGD November 2015 revealed candida esophagitis. Despite preceding barium swallow, there was no  GE junction narrowing. There was a 4 cm hiatal hernia  Patient comes in today with recurrent odynophagia / dysphagia. Symptoms reminiscent of when she was diagnosed with candida esophagitis in November. Patient has taken both antibiotics and prednisone within the last 5 weeks. She is on chronic PPI therapy.  Current Medications, Allergies, Past Medical History, Past Surgical History, Family History and Social History were reviewed in [employer] record.  Physical Exam: General: Pleasant, well developed , white female in no acute distress Head: Normocephalic and atraumatic Eyes:  sclerae anicteric, conjunctiva pink  Mouth: No obvious candida Ears: Decreased hearing Lungs: Clear throughout to auscultation Heart: Regular rate and rhythm Abdomen: Soft, non distended, non-tender. No masses, no hepatomegaly. Normal bowel sounds Neurological: Alert oriented x 4, grossly nonfocal Psychological:  Alert and cooperative. Normal mood and affect  Assessment and Recommendations:  54 year old female with chronic intermittent dysphagia.Now with acute worsening of dysphagia in addition to odynophagia.  She has a history of candida esophagitis and I suspect this may be a reoccurrence given recent use of antibiotics and steroids.Willl treat with Diflucan 200 mg daily for 14 days. Patient will call if she does not have resolution of symptoms.

## 2014-07-09 DIAGNOSIS — R1310 Dysphagia, unspecified: Secondary | ICD-10-CM | POA: Insufficient documentation

## 2014-07-09 NOTE — Progress Notes (Signed)
I agree with the above note, plan 

## 2014-07-17 ENCOUNTER — Ambulatory Visit (INDEPENDENT_AMBULATORY_CARE_PROVIDER_SITE_OTHER): Payer: Medicare Other | Admitting: Gynecology

## 2014-07-17 ENCOUNTER — Encounter: Payer: Self-pay | Admitting: Gynecology

## 2014-07-17 VITALS — BP 110/70 | Ht 64.75 in | Wt 151.0 lb

## 2014-07-17 DIAGNOSIS — N3281 Overactive bladder: Secondary | ICD-10-CM

## 2014-07-17 DIAGNOSIS — M4850XD Collapsed vertebra, not elsewhere classified, site unspecified, subsequent encounter for fracture with routine healing: Secondary | ICD-10-CM

## 2014-07-17 DIAGNOSIS — M810 Age-related osteoporosis without current pathological fracture: Secondary | ICD-10-CM | POA: Diagnosis not present

## 2014-07-17 DIAGNOSIS — Z8639 Personal history of other endocrine, nutritional and metabolic disease: Secondary | ICD-10-CM

## 2014-07-17 DIAGNOSIS — Z01419 Encounter for gynecological examination (general) (routine) without abnormal findings: Secondary | ICD-10-CM | POA: Diagnosis not present

## 2014-07-17 MED ORDER — MIRABEGRON ER 50 MG PO TB24: 50.0000 mg | ORAL_TABLET | Freq: Every day | ORAL | Status: DC

## 2014-07-17 NOTE — Progress Notes (Signed)
[name] 1961-03-25 [id number]   History:    54 y.o.  for annual gyn exam who has history of overactive bladder and was started on Myrbetriq 25 mg daily last year and helped and ran out of the medication she's having stress urinary incontinence again started the medication with a few that she had left and still has the same symptoms.She is menopausal. Patient has never been on hormone replacement therapy. Patient with history of DVT at time of her pregnancy back in 2003 requiring anti-coagulation.  Patient in 2014 had been diagnosed with spontaneous L1 vertebral fracture and had been followed by the spine specialists Dr. Hazle Coca who placed her on a back brace and gave her pain. Patient's lumbar spine report by the radiologist at that time that demonstrated the following:  There are five lumbar type vertebral bodies. The  alignment is stable with a convex left scoliosis. There is a new  superior endplate compression fracture at L1 with less than 20%  loss of vertebral body height. No osseous retropulsion or widening  of the interpedicular distance is identified. No other acute  osseous findings are identified. The advanced degenerative disc  disease and endplate sclerosis at N8-G9 are stable. Lesser  endplate degenerative changes at L4-L5 and fragmentation of the  anterior-superior corner of the L4 vertebral body are unchanged.  Patient subsequently had a bone density study on 07/26/2012 with the following result:  AP spine T score of -1.3  Right femoral neck T score of -2.1  A Frax analysis was not done because of her recently found vertebral fracture   I then started her on Reclast in April 2014 and has done well with the medication with no side effects. Patient last year also had a low vitamin D level with a value of 27 and had been prescribed vitamin D 50,000 units weekly.  Patient with history of cardiomyopathy (ASD repair as a child) is being followed by her  cardiologist a regular basis. Patient is hearing impaired. Patient with prior tubal sterilization procedure.  The patient has informed me that she has several family members with history of breast cancer. Her mother had breast cancer at the age of 17 and 2 of her aunts. Her great-grandmother had ovarian and breast cancer as well. She was informed me that her mother was tested and was negative for the BRCA1 and BRCA2 gene mutation  She has recently been followed by the gastroenterologist who has diagnosed her with hiatal hernia, diverticulosis and gastroesophageal reflux for which she is currently on Protonix. Dr. Ardis Hughs is her gastroenterologist.  Her primary care physician who is been doing her annual blood work is at [id number].  Past medical history,surgical history, family history and social history were all reviewed and documented in the EPIC chart.  Gynecologic History No LMP recorded. Patient is postmenopausal. Contraception: post menopausal status Last Pap: 2014. Results were: normal Last mammogram: 2016. Results were: 3-D and normal  Obstetric History OB History  Gravida Para Term Preterm AB SAB TAB Ectopic Multiple Living  '2 2 2       2    ' # Outcome Date GA Lbr Len/2nd Weight Sex Delivery Anes PTL Lv  2 Term           1 Term                ROS: A ROS was performed and pertinent positives and negatives are included in the history.  GENERAL: No fevers or  chills. HEENT: No change in vision, no earache, sore throat or sinus congestion. NECK: No pain or stiffness. CARDIOVASCULAR: No chest pain or pressure. No palpitations. PULMONARY: No shortness of breath, cough or wheeze. GASTROINTESTINAL: No abdominal pain, nausea, vomiting or diarrhea, melena or bright red blood per rectum. GENITOURINARY: No urinary frequency, urgency, hesitancy or dysuria. MUSCULOSKELETAL: No joint or muscle pain, no back pain, no recent trauma. DERMATOLOGIC: No rash, no itching, no lesions.  ENDOCRINE: No polyuria, polydipsia, no heat or cold intolerance. No recent change in weight. HEMATOLOGICAL: No anemia or easy bruising or bleeding. NEUROLOGIC: No headache, seizures, numbness, tingling or weakness. PSYCHIATRIC: No depression, no loss of interest in normal activity or change in sleep pattern.     Exam: chaperone present  BP 110/70 mmHg  Ht 5' 4.75" (1.645 m)  Wt 151 lb (68.493 kg)  BMI 25.31 kg/m2  Body mass index is 25.31 kg/(m^2).  General appearance : Well developed well nourished female. No acute distress HEENT: Eyes: no retinal hemorrhage or exudates,  Neck supple, trachea midline, no carotid bruits, no thyroidmegaly Lungs: Clear to auscultation, no rhonchi or wheezes, or rib retractions  Heart: Regular rate and rhythm, no murmurs or gallops Breast:Examined in sitting and supine position were symmetrical in appearance, no palpable masses or tenderness,  no skin retraction, no nipple inversion, no nipple discharge, no skin discoloration, no axillary or supraclavicular lymphadenopathy Abdomen: no palpable masses or tenderness, no rebound or guarding Extremities: no edema or skin discoloration or tenderness  Pelvic:  Bartholin, Urethra, Skene Glands: Within normal limits             Vagina: No gross lesions or discharge  Cervix: No gross lesions or discharge  Uterus  anteverted, normal size, shape and consistency, non-tender and mobile  Adnexa  Without masses or tenderness  Anus and perineum  normal   Rectovaginal  normal sphincter tone without palpated masses or tenderness             Hemoccult cards will be provided     Assessment/Plan:  54 y.o. female for annual exam who was started on class on April 2014 as a result of vertebral fracture nontraumatic. Patient due for her next dose next month. Also patient with history of vitamin D deficiency in the past. For these reasons the following labs will be ordered before her next infusion: Calcium level, BUN,  creatinine, and vitamin D level. Her PCP will be doing the rest of her blood work. We are going to increase her Myrbetriq to 50 mg daily for her overactive bladder. She will monitor her symptoms over the course of the next 6 months if no improvement she'll return back to the office for further evaluation. We once again discussed importance of calcium vitamin D and regular exercise for osteoporosis prevention. Pap smear not indicated this year. She was provided with fecal Hemoccult cards to submit to the office for testing.   Terrance Mass MD, 9:55 AM 07/17/2014

## 2014-07-17 NOTE — Patient Instructions (Addendum)
Kegel Exercises The goal of Kegel exercises is to isolate and exercise your pelvic floor muscles. These muscles act as a hammock that supports the rectum, vagina, small intestine, and uterus. As the muscles weaken, the hammock sags and these organs are displaced from their normal positions. Kegel exercises can strengthen your pelvic floor muscles and help you to improve bladder and bowel control, improve sexual response, and help reduce many problems and some discomfort during pregnancy. Kegel exercises can be done anywhere and at any time. HOW TO PERFORM KEGEL EXERCISES 1. Locate your pelvic floor muscles. To do this, squeeze (contract) the muscles that you use when you try to stop the flow of urine. You will feel a tightness in the vaginal area (women) and a tight lift in the rectal area (men and women). 2. When you begin, contract your pelvic muscles tight for 2-5 seconds, then relax them for 2-5 seconds. This is one set. Do 4-5 sets with a short pause in between. 3. Contract your pelvic muscles for 8-10 seconds, then relax them for 8-10 seconds. Do 4-5 sets. If you cannot contract your pelvic muscles for 8-10 seconds, try 5-7 seconds and work your way up to 8-10 seconds. Your goal is 4-5 sets of 10 contractions each day. Keep your stomach, buttocks, and legs relaxed during the exercises. Perform sets of both short and long contractions. Vary your positions. Perform these contractions 3-4 times per day. Perform sets while you are:   Lying in bed in the morning.  Standing at lunch.  Sitting in the late afternoon.  Lying in bed at night. You should do 40-50 contractions per day. Do not perform more Kegel exercises per day than recommended. Overexercising can cause muscle fatigue. Continue these exercises for for at least 15-20 weeks or as directed by your caregiver. Document Released: 04/12/2012 Document Reviewed: 04/12/2012 [facility] Patient Information 2015 [place]. This information is  not intended to replace advice given to you by your health care provider. Make sure you discuss any questions you have with your health care provider. Zoledronic Acid injection (Paget's Disease, Osteoporosis) What is this medicine? ZOLEDRONIC ACID (ZOE le dron ik AS id) lowers the amount of calcium loss from bone. It is used to treat Paget's disease and osteoporosis in women. This medicine may be used for other purposes; ask your health care provider or pharmacist if you have questions. COMMON BRAND NAME(S): Reclast, Zometa What should I tell my health care provider before I take this medicine? They need to know if you have any of these conditions: -aspirin-sensitive asthma -cancer, especially if you are receiving medicines used to treat cancer -dental disease or wear dentures -infection -kidney disease -low levels of calcium in the blood -past surgery on the parathyroid gland or intestines -receiving corticosteroids like dexamethasone or prednisone -an unusual or allergic reaction to zoledronic acid, other medicines, foods, dyes, or preservatives -pregnant or trying to get pregnant -breast-feeding How should I use this medicine? This medicine is for infusion into a vein. It is given by a health care professional in a hospital or clinic setting. Talk to your pediatrician regarding the use of this medicine in children. This medicine is not approved for use in children. Overdosage: If you think you have taken too much of this medicine contact a poison control center or emergency room at once. NOTE: This medicine is only for you. Do not share this medicine with others. What if I miss a dose? It is important not to miss your dose.  Call your doctor or health care professional if you are unable to keep an appointment. What may interact with this medicine? -certain antibiotics given by injection -NSAIDs, medicines for pain and inflammation, like ibuprofen or naproxen -some diuretics like  bumetanide, furosemide -teriparatide This list may not describe all possible interactions. Give your health care provider a list of all the medicines, herbs, non-prescription drugs, or dietary supplements you use. Also tell them if you smoke, drink alcohol, or use illegal drugs. Some items may interact with your medicine. What should I watch for while using this medicine? Visit your doctor or health care professional for regular checkups. It may be some time before you see the benefit from this medicine. Do not stop taking your medicine unless your doctor tells you to. Your doctor may order blood tests or other tests to see how you are doing. Women should inform their doctor if they wish to become pregnant or think they might be pregnant. There is a potential for serious side effects to an unborn child. Talk to your health care professional or pharmacist for more information. You should make sure that you get enough calcium and vitamin D while you are taking this medicine. Discuss the foods you eat and the vitamins you take with your health care professional. Some people who take this medicine have severe bone, joint, and/or muscle pain. This medicine may also increase your risk for jaw problems or a broken thigh bone. Tell your doctor right away if you have severe pain in your jaw, bones, joints, or muscles. Tell your doctor if you have any pain that does not go away or that gets worse. Tell your dentist and dental surgeon that you are taking this medicine. You should not have major dental surgery while on this medicine. See your dentist to have a dental exam and fix any dental problems before starting this medicine. Take good care of your teeth while on this medicine. Make sure you see your dentist for regular follow-up appointments. What side effects may I notice from receiving this medicine? Side effects that you should report to your doctor or health care professional as soon as possible: -allergic  reactions like skin rash, itching or hives, swelling of the face, lips, or tongue -anxiety, confusion, or depression -breathing problems -changes in vision -eye pain -feeling faint or lightheaded, falls -jaw pain, especially after dental work -mouth sores -muscle cramps, stiffness, or weakness -trouble passing urine or change in the amount of urine Side effects that usually do not require medical attention (report to your doctor or health care professional if they continue or are bothersome): -bone, joint, or muscle pain -constipation -diarrhea -fever -hair loss -irritation at site where injected -loss of appetite -nausea, vomiting -stomach upset -trouble sleeping -trouble swallowing -weak or tired This list may not describe all possible side effects. Call your doctor for medical advice about side effects. You may report side effects to FDA at 1-800-FDA-1088. Where should I keep my medicine? This drug is given in a hospital or clinic and will not be stored at home. NOTE: This sheet is a summary. It may not cover all possible information. If you have questions about this medicine, talk to your doctor, pharmacist, or health care provider.  2015, Elsevier/Gold Standard. (2012-10-09 10:03:48)

## 2014-07-17 NOTE — Addendum Note (Signed)
Addended by: Terrance Mass on: 07/17/2014 10:23 AM   Modules accepted: Orders

## 2014-07-18 ENCOUNTER — Other Ambulatory Visit: Payer: Self-pay | Admitting: Gynecology

## 2014-07-18 ENCOUNTER — Encounter: Payer: Self-pay | Admitting: Gynecology

## 2014-07-18 DIAGNOSIS — E559 Vitamin D deficiency, unspecified: Secondary | ICD-10-CM

## 2014-07-18 LAB — VITAMIN D 25 HYDROXY (VIT D DEFICIENCY, FRACTURES): Vit D, 25-Hydroxy: 10 ng/mL — ABNORMAL LOW (ref 30–100)

## 2014-07-18 MED ORDER — VITAMIN D (ERGOCALCIFEROL) 1.25 MG (50000 UNIT) PO CAPS: 50000.0000 [IU] | ORAL_CAPSULE | ORAL | Status: DC

## 2014-07-28 ENCOUNTER — Emergency Department (HOSPITAL_BASED_OUTPATIENT_CLINIC_OR_DEPARTMENT_OTHER)
Admission: EM | Admit: 2014-07-28 | Discharge: 2014-07-28 | Disposition: A | Discharge status: Home / Self Care | Payer: Medicare Other | Attending: Emergency Medicine | Admitting: Emergency Medicine

## 2014-07-28 ENCOUNTER — Encounter (HOSPITAL_BASED_OUTPATIENT_CLINIC_OR_DEPARTMENT_OTHER): Payer: Self-pay | Admitting: Emergency Medicine

## 2014-07-28 DIAGNOSIS — E559 Vitamin D deficiency, unspecified: Secondary | ICD-10-CM | POA: Diagnosis not present

## 2014-07-28 DIAGNOSIS — K2970 Gastritis, unspecified, without bleeding: Secondary | ICD-10-CM

## 2014-07-28 DIAGNOSIS — R51 Headache: Secondary | ICD-10-CM

## 2014-07-28 DIAGNOSIS — G43909 Migraine, unspecified, not intractable, without status migrainosus: Secondary | ICD-10-CM | POA: Diagnosis not present

## 2014-07-28 DIAGNOSIS — R1110 Vomiting, unspecified: Secondary | ICD-10-CM | POA: Diagnosis present

## 2014-07-28 DIAGNOSIS — F419 Anxiety disorder, unspecified: Secondary | ICD-10-CM | POA: Diagnosis not present

## 2014-07-28 DIAGNOSIS — M797 Fibromyalgia: Secondary | ICD-10-CM | POA: Diagnosis not present

## 2014-07-28 DIAGNOSIS — R519 Headache, unspecified: Secondary | ICD-10-CM

## 2014-07-28 DIAGNOSIS — Z9049 Acquired absence of other specified parts of digestive tract: Secondary | ICD-10-CM | POA: Insufficient documentation

## 2014-07-28 DIAGNOSIS — M1990 Unspecified osteoarthritis, unspecified site: Secondary | ICD-10-CM | POA: Insufficient documentation

## 2014-07-28 DIAGNOSIS — K219 Gastro-esophageal reflux disease without esophagitis: Secondary | ICD-10-CM | POA: Diagnosis not present

## 2014-07-28 DIAGNOSIS — Z862 Personal history of diseases of the blood and blood-forming organs and certain disorders involving the immune mechanism: Secondary | ICD-10-CM | POA: Insufficient documentation

## 2014-07-28 DIAGNOSIS — J3489 Other specified disorders of nose and nasal sinuses: Secondary | ICD-10-CM | POA: Insufficient documentation

## 2014-07-28 DIAGNOSIS — I10 Essential (primary) hypertension: Secondary | ICD-10-CM | POA: Diagnosis not present

## 2014-07-28 DIAGNOSIS — Z79899 Other long term (current) drug therapy: Secondary | ICD-10-CM | POA: Insufficient documentation

## 2014-07-28 DIAGNOSIS — H9190 Unspecified hearing loss, unspecified ear: Secondary | ICD-10-CM | POA: Diagnosis not present

## 2014-07-28 DIAGNOSIS — F329 Major depressive disorder, single episode, unspecified: Secondary | ICD-10-CM | POA: Insufficient documentation

## 2014-07-28 DIAGNOSIS — Z86711 Personal history of pulmonary embolism: Secondary | ICD-10-CM | POA: Insufficient documentation

## 2014-07-28 DIAGNOSIS — Z7982 Long term (current) use of aspirin: Secondary | ICD-10-CM | POA: Insufficient documentation

## 2014-07-28 LAB — CBC WITH DIFFERENTIAL/PLATELET
Basophils Absolute: 0.1 10*3/uL (ref 0.0–0.1)
Basophils Relative: 1 % (ref 0–1)
EOS ABS: 0.1 10*3/uL (ref 0.0–0.7)
EOS PCT: 1 % (ref 0–5)
HCT: 44.3 % (ref 36.0–46.0)
Hemoglobin: 15.6 g/dL — ABNORMAL HIGH (ref 12.0–15.0)
LYMPHS ABS: 0.9 10*3/uL (ref 0.7–4.0)
Lymphocytes Relative: 13 % (ref 12–46)
MCH: 33.5 pg (ref 26.0–34.0)
MCHC: 35.2 g/dL (ref 30.0–36.0)
MCV: 95.1 fL (ref 78.0–100.0)
Monocytes Absolute: 0.4 10*3/uL (ref 0.1–1.0)
Monocytes Relative: 6 % (ref 3–12)
NEUTROS ABS: 5.7 10*3/uL (ref 1.7–7.7)
NEUTROS PCT: 79 % — AB (ref 43–77)
PLATELETS: 249 10*3/uL (ref 150–400)
RBC: 4.66 MIL/uL (ref 3.87–5.11)
RDW: 13.8 % (ref 11.5–15.5)
WBC: 7.1 10*3/uL (ref 4.0–10.5)

## 2014-07-28 LAB — COMPREHENSIVE METABOLIC PANEL
ALK PHOS: 87 U/L (ref 39–117)
ALT: 26 U/L (ref 0–35)
ANION GAP: 18 — AB (ref 5–15)
AST: 40 U/L — AB (ref 0–37)
Albumin: 4.7 g/dL (ref 3.5–5.2)
BUN: 14 mg/dL (ref 6–23)
CALCIUM: 13 mg/dL — AB (ref 8.4–10.5)
CHLORIDE: 97 mmol/L (ref 96–112)
CO2: 24 mmol/L (ref 19–32)
Creatinine, Ser: 0.84 mg/dL (ref 0.50–1.10)
GFR calc non Af Amer: 78 mL/min — ABNORMAL LOW (ref >90)
GLUCOSE: 99 mg/dL (ref 70–99)
Potassium: 4.1 mmol/L (ref 3.5–5.1)
SODIUM: 139 mmol/L (ref 135–145)
TOTAL PROTEIN: 8.1 g/dL (ref 6.0–8.3)
Total Bilirubin: 0.7 mg/dL (ref 0.3–1.2)

## 2014-07-28 MED ORDER — PROMETHAZINE HCL 25 MG/ML IJ SOLN
6.2500 mg | Freq: Once | INTRAMUSCULAR | Status: AC
  Administered 2014-07-28: 6.25 mg via INTRAVENOUS
  Filled 2014-07-28: qty 1

## 2014-07-28 MED ORDER — SODIUM CHLORIDE 0.9 % IV BOLUS (SEPSIS)
500.0000 mL | Freq: Once | INTRAVENOUS | Status: AC
  Administered 2014-07-28: 500 mL via INTRAVENOUS

## 2014-07-28 MED ORDER — ONDANSETRON 4 MG PO TBDP
4.0000 mg | ORAL_TABLET | Freq: Once | ORAL | Status: AC
  Administered 2014-07-28: 4 mg via ORAL

## 2014-07-28 MED ORDER — SODIUM CHLORIDE 0.9 % IV BOLUS (SEPSIS): 1000.0000 mL | Freq: Once | INTRAVENOUS | Status: DC

## 2014-07-28 MED ORDER — KETOROLAC TROMETHAMINE 30 MG/ML IJ SOLN
30.0000 mg | Freq: Once | INTRAMUSCULAR | Status: AC
  Administered 2014-07-28: 30 mg via INTRAVENOUS
  Filled 2014-07-28: qty 1

## 2014-07-28 MED ORDER — ONDANSETRON 4 MG PO TBDP
ORAL_TABLET | ORAL | Status: AC
  Administered 2014-07-28: 4 mg via ORAL
  Filled 2014-07-28: qty 1

## 2014-07-28 MED ORDER — ONDANSETRON HCL 4 MG PO TABS: 4.0000 mg | ORAL_TABLET | Freq: Three times a day (TID) | ORAL | Status: DC | PRN

## 2014-07-28 NOTE — Discharge Instructions (Signed)
Patient advised to rest, drink plenty of fluids, and use Zofran as needed for nausea. If headache returns please use medication previously prescribed. If new or worsening symptoms present including vomiting, severe abdominal pain, changes in bowel or bladder function, fever, or any concerning signs or symptoms, please return for immediate evaluation.

## 2014-07-28 NOTE — ED Notes (Signed)
Vomiting starting yesterday morning, severe abd pain, nausea, neck pain, headache. Denies fever, but +chills; hot flashes, +diarrhea; denies chest pain.

## 2014-07-28 NOTE — ED Provider Notes (Signed)
CSN: [id number]     Arrival date & time 07/28/14  1052 History   First MD Initiated Contact with Patient 07/28/14 1331     Chief Complaint  Patient presents with  . Emesis    HPI   54 year old female presents with one-day history of headache, nausea, vomiting, epigastric pain. Patient reports yesterday she started feeling nauseous with multiple episodes of nonbloody emesis. She reports associated epigastric discomfort and non-bloody diarrhea. She denies any changes in eating habits, or exposure to sick contacts. She reports subsequent emesis with attempted oral intake. Patient also reports watery eyes, and rhinorrhea. She denies shortness of breath chest pain, changes in urinary frequency or habits.  Patient additionally notes a headache that she describes as similar to previous headaches. It feels as if it's a pounding sensation in the frontal area with no radiation of symptoms. She denies sudden onset, changes in vision, or any neurological complaints. She reports she takes Fioricet at home for these. She has not attempted to take anything today. He also reports neck pain, reports she has full range of motion and no stiffness of the neck. It says neck pain is remitted since a young age. Patient denies fever or any obvious sources of infection.   Past Medical History  Diagnosis Date  . Depression   . Migraine   . Fibromyalgia   . Hearing impairment   . Arthritis   . Cardiomyopathy   . Pulmonary embolism   . Gastritis   . Osteoporosis   . Anemia   . Allergy     SEASONAL  . Anxiety   . GERD (gastroesophageal reflux disease)   . Hypertension     [name], take htn medication to regulate heart beat.  . Sleep apnea   . Vitamin D deficiency    Past Surgical History  Procedure Laterality Date  . Appendectomy    . Inner ear surgery      TUBES  . Tonsillectomy and adenoidectomy    . Tubal ligation    . Laparoscopic cholecystectomy  2012  . Cardiac surgery  January 2007    mitral  valve repair  . Cardiac surgery  1973    atrial septal defect   Family History  Problem Relation Age of Onset  . Breast cancer Mother     bilateral; ages 72 and 3; TAH/BSO ~50  . Depression Sister   . Heart disease Father   . Hypertension Father   . Colon cancer Neg Hx   . Esophageal cancer Neg Hx   . Pancreatic cancer Neg Hx   . Rectal cancer Neg Hx   . Stomach cancer Neg Hx    History  Substance Use Topics  . Smoking status: Never Smoker   . Smokeless tobacco: Never Used  . Alcohol Use: No   OB History    Gravida Para Term Preterm AB TAB SAB Ectopic Multiple Living   2 2 2       2      Review of Systems  All other systems reviewed and are negative.   Allergies  Depakote and Valium  Home Medications   Prior to Admission medications   Medication Sig Start Date End Date Taking? Authorizing Provider  aspirin EC 81 MG tablet Take 81 mg by mouth every morning.    Historical Provider, MD  buPROPion (WELLBUTRIN XL) 150 MG 24 hr tablet Take 3 tablets (450 mg total) by mouth every morning. 07/04/14   Norma Fredrickson, MD  butalbital-acetaminophen-caffeine (FIORICET, ESGIC) [phone] MG  per tablet Take 1 tablet by mouth daily as needed for migraine (migraine). For migraines 01/16/14   Historical Provider, MD  Calcium Carbonate-Vitamin D (CALCIUM 600 + D PO) Take 1 tablet by mouth 2 (two) times daily.    Historical Provider, MD  carvedilol (COREG) 25 MG tablet TAKE 1 TABLET BY MOUTH TWICE A DAY WITH MEALS 05/06/14   Jerline Pain, MD  Flaxseed, Linseed, (FLAXSEED OIL) 1000 MG CAPS Take 1,000 mg by mouth 2 (two) times daily.    Historical Provider, MD  fluconazole (DIFLUCAN) 100 MG tablet Take 1 tablet (100 mg total) by mouth daily. Patient not taking: Reported on 07/17/2014 04/03/14   Milus Banister, MD  fluconazole (DIFLUCAN) 200 MG tablet Take 1 tablet (200 mg total) by mouth daily. 07/08/14   Willia Craze, NP  hydrochlorothiazide (MICROZIDE) 12.5 MG capsule Take 1 capsule (12.5  mg total) by mouth every morning. 05/16/14   Jerline Pain, MD  loratadine (CLARITIN) 10 MG tablet Take 10 mg by mouth daily.    Historical Provider, MD  mirabegron ER (MYRBETRIQ) 50 MG TB24 tablet Take 1 tablet (50 mg total) by mouth daily. 07/17/14   Terrance Mass, MD  Multiple Vitamin (MULTIVITAMIN) tablet Take 1 tablet by mouth daily.     Historical Provider, MD  ondansetron (ZOFRAN) 4 MG tablet Take 1 tablet (4 mg total) by mouth every 6 (six) hours. Patient not taking: Reported on 07/17/2014 04/21/14   Quintella Reichert, MD  pantoprazole (PROTONIX) 40 MG tablet Take 1 tablet (40 mg total) by mouth daily. 10/29/13   Milus Banister, MD  prochlorperazine (COMPAZINE) 10 MG tablet Take 10 mg by mouth every 6 (six) hours as needed for nausea or vomiting.  10/26/13   Historical Provider, MD  promethazine (PHENERGAN) 25 MG tablet Take 1 tablet (25 mg total) by mouth every 6 (six) hours as needed for nausea or vomiting. Patient not taking: Reported on 07/17/2014 11/21/13   Antonietta Breach, PA-C  Vitamin D, Ergocalciferol, (DRISDOL) 50000 UNITS CAPS capsule Take 1 capsule (50,000 Units total) by mouth every 7 (seven) days. 07/18/14   Terrance Mass, MD   BP 135/94 mmHg  Pulse 87  Temp(Src) 97.7 F (36.5 C) (Oral)  Resp 18  Ht 5\' 5"  (1.651 m)  Wt 154 lb (69.854 kg)  BMI 25.63 kg/m2  SpO2 99% Physical Exam  Constitutional: She is oriented to person, place, and time. She appears well-developed and well-nourished.  HENT:  Head: Normocephalic and atraumatic.  Right Ear: Tympanic membrane and ear canal normal.  Left Ear: Tympanic membrane and ear canal normal.  Nose: Rhinorrhea present.  Eyes: Pupils are equal, round, and reactive to light.  Neck: Normal range of motion. Neck supple. No JVD present. No tracheal deviation present. No thyromegaly present.  Cardiovascular: Normal rate, regular rhythm, normal heart sounds and intact distal pulses.  Exam reveals no gallop and no friction rub.   No murmur  heard. Pulmonary/Chest: Effort normal and breath sounds normal. No stridor. No respiratory distress. She has no wheezes. She has no rales. She exhibits no tenderness.  Abdominal: Soft. Bowel sounds are normal. She exhibits no distension and no mass. There is no rebound and no guarding.  Mild epigastric tenderness.  Musculoskeletal: Normal range of motion.  Lymphadenopathy:    She has no cervical adenopathy.  Neurological: She is alert and oriented to person, place, and time. She has normal strength and normal reflexes. No cranial nerve deficit. Coordination normal. GCS eye  subscore is 4. GCS verbal subscore is 5. GCS motor subscore is 6.  Skin: Skin is warm and dry.  Psychiatric: She has a normal mood and affect. Her behavior is normal. Judgment and thought content normal.  Nursing note and vitals reviewed.   ED Course  Procedures (including critical care time) Labs Review Labs Reviewed  CBC WITH DIFFERENTIAL/PLATELET - Abnormal; Notable for the following:    Hemoglobin 15.6 (*)    Neutrophils Relative % 79 (*)    All other components within normal limits  COMPREHENSIVE METABOLIC PANEL - Abnormal; Notable for the following:    Calcium 13.0 (*)    AST 40 (*)    GFR calc non Af Amer 78 (*)    Anion gap 18 (*)    All other components within normal limits    Imaging Review No results found.   EKG Interpretation None      MDM   Final diagnoses:  Acute nonintractable headache, unspecified headache type  Gastritis   Labs: CBC, CMP no significant findings.   Therapeutics: Zofran, NaCl, Toradol, Phenergan   Assessment/Plan: Headache, gastritis   Patient was evaluated no acute concerning findings. Benign headache that was improved with Toradol and Phenergan. Patient requesting Dilaudid but was satisfied with Toradol pain relief. The symptoms are similar to previous episodes for which she has a GI doctor she sees. Patient's condition was improved at time of discharge. She is  instructed follow-up with her primary care provider further evaluation and management, and given return instructions if signs or symptoms worsen.    Okey Regal, PA-C 07/30/14 [id number]  Malvin Johns, MD 07/30/14 [phone]

## 2014-07-28 NOTE — ED Notes (Signed)
Pt up to BR, gait steady, urine spec obtained, pt noted to be holding abd and some guarding noted with getting on and off stretcher. No further vomiting noted since placed in examination room

## 2014-07-28 NOTE — ED Notes (Signed)
Pt presents at this time due to vomiting at times w/ difficulty swallowing, pt states this is an ongoing issue and sees Dr Ardis Hughs w/ [facility] for this problem, pt states this time has HA w/ neck pain

## 2014-07-28 NOTE — ED Notes (Signed)
Patient sipping sprite w/o difficulty

## 2014-08-01 DIAGNOSIS — R569 Unspecified convulsions: Secondary | ICD-10-CM | POA: Diagnosis not present

## 2014-08-01 DIAGNOSIS — Z7982 Long term (current) use of aspirin: Secondary | ICD-10-CM | POA: Diagnosis not present

## 2014-08-01 DIAGNOSIS — H906 Mixed conductive and sensorineural hearing loss, bilateral: Secondary | ICD-10-CM | POA: Diagnosis not present

## 2014-08-01 DIAGNOSIS — H8303 Labyrinthitis, bilateral: Secondary | ICD-10-CM | POA: Diagnosis not present

## 2014-08-01 DIAGNOSIS — I6782 Cerebral ischemia: Secondary | ICD-10-CM | POA: Diagnosis not present

## 2014-08-01 DIAGNOSIS — F329 Major depressive disorder, single episode, unspecified: Secondary | ICD-10-CM | POA: Diagnosis not present

## 2014-08-01 DIAGNOSIS — I519 Heart disease, unspecified: Secondary | ICD-10-CM | POA: Diagnosis not present

## 2014-08-01 DIAGNOSIS — G4730 Sleep apnea, unspecified: Secondary | ICD-10-CM | POA: Diagnosis not present

## 2014-08-01 DIAGNOSIS — M2540 Effusion, unspecified joint: Secondary | ICD-10-CM | POA: Diagnosis not present

## 2014-08-01 DIAGNOSIS — H9193 Unspecified hearing loss, bilateral: Secondary | ICD-10-CM | POA: Diagnosis not present

## 2014-08-01 DIAGNOSIS — H905 Unspecified sensorineural hearing loss: Secondary | ICD-10-CM | POA: Diagnosis not present

## 2014-08-12 ENCOUNTER — Telehealth: Payer: Self-pay | Admitting: Cardiology

## 2014-08-12 DIAGNOSIS — Z23 Encounter for immunization: Secondary | ICD-10-CM | POA: Diagnosis not present

## 2014-08-12 NOTE — Telephone Encounter (Signed)
Walk-in form [employer] [facility] [employer] paperwork dropped off gave to [employer]

## 2014-08-19 DIAGNOSIS — Z7982 Long term (current) use of aspirin: Secondary | ICD-10-CM | POA: Diagnosis not present

## 2014-08-19 DIAGNOSIS — M797 Fibromyalgia: Secondary | ICD-10-CM | POA: Diagnosis not present

## 2014-08-19 DIAGNOSIS — F411 Generalized anxiety disorder: Secondary | ICD-10-CM | POA: Diagnosis not present

## 2014-08-19 DIAGNOSIS — F329 Major depressive disorder, single episode, unspecified: Secondary | ICD-10-CM | POA: Diagnosis not present

## 2014-08-19 DIAGNOSIS — M810 Age-related osteoporosis without current pathological fracture: Secondary | ICD-10-CM | POA: Diagnosis not present

## 2014-08-19 DIAGNOSIS — Z01810 Encounter for preprocedural cardiovascular examination: Secondary | ICD-10-CM | POA: Diagnosis not present

## 2014-08-19 DIAGNOSIS — Z79899 Other long term (current) drug therapy: Secondary | ICD-10-CM | POA: Diagnosis not present

## 2014-08-19 DIAGNOSIS — Z419 Encounter for procedure for purposes other than remedying health state, unspecified: Secondary | ICD-10-CM | POA: Diagnosis not present

## 2014-08-19 DIAGNOSIS — Z01818 Encounter for other preprocedural examination: Secondary | ICD-10-CM | POA: Diagnosis not present

## 2014-08-19 DIAGNOSIS — Q790 Congenital diaphragmatic hernia: Secondary | ICD-10-CM | POA: Diagnosis not present

## 2014-08-19 DIAGNOSIS — I517 Cardiomegaly: Secondary | ICD-10-CM | POA: Diagnosis not present

## 2014-08-19 DIAGNOSIS — Z9889 Other specified postprocedural states: Secondary | ICD-10-CM | POA: Diagnosis not present

## 2014-08-19 DIAGNOSIS — I10 Essential (primary) hypertension: Secondary | ICD-10-CM | POA: Diagnosis not present

## 2014-08-19 DIAGNOSIS — H9190 Unspecified hearing loss, unspecified ear: Secondary | ICD-10-CM | POA: Diagnosis not present

## 2014-08-19 DIAGNOSIS — R9431 Abnormal electrocardiogram [ECG] [EKG]: Secondary | ICD-10-CM | POA: Diagnosis not present

## 2014-08-20 ENCOUNTER — Telehealth: Payer: Self-pay | Admitting: Cardiology

## 2014-08-20 ENCOUNTER — Telehealth: Payer: Self-pay | Admitting: Gynecology

## 2014-08-20 DIAGNOSIS — M810 Age-related osteoporosis without current pathological fracture: Secondary | ICD-10-CM

## 2014-08-20 NOTE — Telephone Encounter (Deleted)
.  left message on voicemail for pt to call back.

## 2014-08-20 NOTE — Telephone Encounter (Signed)
New Message    Patient needs to talk to nurse because nurse called on Friday that she was in the process of faxing Dr. Marlou Porch permission for patient to stop taking Asprin because they will not perform operation on 08/27/14. Please give patients mom a call.

## 2014-08-20 NOTE — Telephone Encounter (Signed)
Spoke with Lelon Frohlich - she is aware [facility] refaxed paperwork this AM.  She will call back if they did not receive it.

## 2014-08-20 NOTE — Telephone Encounter (Signed)
Pt did have her Vit D level drawn. I told them to wait on her BUN CREAT, and Calcium due to Reclast was given 09/25/2013 so I will need to wait until closer to that date , due to 30 day window and calling for approval for insurance. Is this ok with you?  Note per Dr Toney Rakes. Orders placed for lab work. Left message on voice mail for patient to call back to schedule lab work around the week of 09/16/14.

## 2014-08-21 ENCOUNTER — Encounter: Payer: Self-pay | Admitting: Gastroenterology

## 2014-08-21 ENCOUNTER — Ambulatory Visit (INDEPENDENT_AMBULATORY_CARE_PROVIDER_SITE_OTHER): Payer: Medicare Other | Admitting: Gastroenterology

## 2014-08-21 VITALS — BP 116/66 | HR 70 | Ht 64.75 in | Wt 150.2 lb

## 2014-08-21 DIAGNOSIS — B3781 Candidal esophagitis: Secondary | ICD-10-CM

## 2014-08-21 MED ORDER — FLUCONAZOLE 100 MG PO TABS: 100.0000 mg | ORAL_TABLET | Freq: Every day | ORAL | Status: DC

## 2014-08-21 NOTE — Patient Instructions (Signed)
New prescription for diflucan given, take 10 day course as needed when you are on antibiotics for other reasons. Return as needed.

## 2014-08-21 NOTE — Progress Notes (Signed)
Review of pertinent gastrointestinal problems: 1. Routine risk for colon cancer: colonsocopy Dr. Ardis Hughs 2009 was normal. Repeat colonoscopy Dr. Ardis Hughs 10/2013 normal except single diminutive polyp that was removed, not retrieved, recommended recall in 10 years. 2. Mild gastritis, EGD Dr. Ardis Hughs 10/2013; biopsies show reactive gastropathy, no H pylori. Also manilia esophagititis, treated with diflucan empirically. 3. Dysphagia from recurrent yeast infections: 11/15 EGD Dr. Ardis Hughs found [place], tortuous esophagus, again candida esophagitis, treated with diflucan.  Office visit April 2016 she reported that she has always had trouble with used infections after antibiotics. Even when she was a little girl. She will get vaginal infections, abdominal wall infections. Lately she has had trouble with esophageal yeast infections.   HPI: This is a very pleasant 54 year old woman whom I last saw 2 or 3 months ago.  Gets a lot of yeast infections (abd wall, vaginal in the past) with any antibiotics.  She is having a cochlear implant next week and wants to know if she should get Diflucan to take afterwards  Currently she is swallowing well.   Past Medical History  Diagnosis Date  . Depression   . Migraine   . Fibromyalgia   . Hearing impairment   . Arthritis   . Cardiomyopathy   . Pulmonary embolism   . Gastritis   . Osteoporosis   . Anemia   . Allergy     SEASONAL  . Anxiety   . GERD (gastroesophageal reflux disease)   . Hypertension     [name], take htn medication to regulate heart beat.  . Sleep apnea   . Vitamin D deficiency     Past Surgical History  Procedure Laterality Date  . Appendectomy    . Inner ear surgery      TUBES  . Tonsillectomy and adenoidectomy    . Tubal ligation    . Laparoscopic cholecystectomy  2012  . Cardiac surgery  January 2007    mitral valve repair  . Cardiac surgery  1973    atrial septal defect    Current Outpatient Prescriptions  Medication Sig  Dispense Refill  . aspirin EC 81 MG tablet Take 81 mg by mouth every morning.    [name] buPROPion (WELLBUTRIN XL) 150 MG 24 hr tablet Take 3 tablets (450 mg total) by mouth every morning. 90 tablet 3  . butalbital-acetaminophen-caffeine (FIORICET, ESGIC) 50-325-40 MG per tablet Take 1 tablet by mouth daily as needed for migraine (migraine). For migraines    . Calcium Carbonate-Vitamin D (CALCIUM 600 + D PO) Take 1 tablet by mouth 2 (two) times daily.    . carvedilol (COREG) 25 MG tablet TAKE 1 TABLET BY MOUTH TWICE A DAY WITH MEALS 60 tablet 3  . hydrochlorothiazide (MICROZIDE) 12.5 MG capsule Take 1 capsule (12.5 mg total) by mouth every morning. 90 capsule 2  . loratadine (CLARITIN) 10 MG tablet Take 10 mg by mouth daily.    . mirabegron ER (MYRBETRIQ) 50 MG TB24 tablet Take 1 tablet (50 mg total) by mouth daily. 30 tablet 11  . pantoprazole (PROTONIX) 40 MG tablet Take 1 tablet (40 mg total) by mouth daily. 90 tablet 3  . Vitamin D, Ergocalciferol, (DRISDOL) 50000 UNITS CAPS capsule Take 1 capsule (50,000 Units total) by mouth every 7 (seven) days. 12 capsule 0   No current facility-administered medications for this visit.    Allergies as of 08/21/2014 - Review Complete 08/21/2014  Allergen Reaction Noted  . Depakote [divalproex sodium] Anaphylaxis 07/01/2012  . Valium Anaphylaxis 09/23/2010  Family History  Problem Relation Age of Onset  . Breast cancer Mother     bilateral; ages 11 and 55; TAH/BSO ~50  . Depression Sister   . Heart disease Father   . Hypertension Father   . Colon cancer Neg Hx   . Esophageal cancer Neg Hx   . Pancreatic cancer Neg Hx   . Rectal cancer Neg Hx   . Stomach cancer Neg Hx     History   Social History  . Marital Status: Divorced    Spouse Name: N/A  . Number of Children: N/A  . Years of Education: N/A   Occupational History  . Not on file.   Social History Main Topics  . Smoking status: Never Smoker   . Smokeless tobacco: Never Used  .  Alcohol Use: No  . Drug Use: No  . Sexual Activity: Yes    Birth Control/ Protection: Surgical     Comment: 1st intercourse- 19, partners- 66   Other Topics Concern  . Not on file   Social History Narrative      Physical Exam: BP 116/66 mmHg  Pulse 70  Ht 5' 4.75" (1.645 m)  Wt 150 lb 3.2 oz (68.13 kg)  BMI 25.18 kg/m2 Constitutional: generally well-appearing Psychiatric: alert and oriented x3 Abdomen: soft, nontender, nondistended, no obvious ascites, no peritoneal signs, normal bowel sounds     Assessment and plan: 54 y.o. female with recurrent yeast infections of her esophagus  Even since she was a little girl she has had trouble with yeast infections, vaginally, abdominal wall. More recently she has had issues with dysphagia from esophageal yeast infections following antibiotic treatment. It is not clear why this is for her however I think there is no need to do any significant testing for her. I am writing her prescription for Diflucan which she knows to start a 10 day course if she is on antibiotics for any other reason. She will call to see me on an as-needed basis.

## 2014-08-26 ENCOUNTER — Telehealth: Payer: Self-pay | Admitting: Cardiology

## 2014-08-26 NOTE — Telephone Encounter (Signed)
Dr Marlou Porch gave clearance for pt to have surgery.  Paperwork taken to MR to be faxed back to [facility].

## 2014-08-26 NOTE — Telephone Encounter (Signed)
New Message  Juliann Pulse from [facility]- Dr. Idelle Crouch wants to clarify a surg. Clearance w/ Pam. Please call back and discuss.

## 2014-08-26 NOTE — Telephone Encounter (Signed)
Follow up ° ° ° ° °Returning [name]'s call °

## 2014-08-26 NOTE — Telephone Encounter (Signed)
Juliann Pulse calling to see if pt was given surgical clearance for upcoming procedure.  Advised the only information based on the paperwork received was asking approval for pt to come off ASA 10 days prior to and 5 days after surgery.  She was given clearance to hold ASA.  Now Juliann Pulse is asking for surgical clearance.  Advised I will discuss with Dr Marlou Porch and let her know.  She thanked me for my time.

## 2014-08-27 DIAGNOSIS — F329 Major depressive disorder, single episode, unspecified: Secondary | ICD-10-CM | POA: Diagnosis not present

## 2014-08-27 DIAGNOSIS — Z9621 Cochlear implant status: Secondary | ICD-10-CM | POA: Diagnosis not present

## 2014-08-27 DIAGNOSIS — H905 Unspecified sensorineural hearing loss: Secondary | ICD-10-CM | POA: Diagnosis not present

## 2014-08-27 DIAGNOSIS — G4730 Sleep apnea, unspecified: Secondary | ICD-10-CM | POA: Diagnosis not present

## 2014-08-27 DIAGNOSIS — H903 Sensorineural hearing loss, bilateral: Secondary | ICD-10-CM | POA: Diagnosis not present

## 2014-08-27 DIAGNOSIS — N3281 Overactive bladder: Secondary | ICD-10-CM | POA: Diagnosis not present

## 2014-08-27 DIAGNOSIS — Z7982 Long term (current) use of aspirin: Secondary | ICD-10-CM | POA: Diagnosis not present

## 2014-08-27 DIAGNOSIS — I519 Heart disease, unspecified: Secondary | ICD-10-CM | POA: Diagnosis not present

## 2014-08-27 DIAGNOSIS — M797 Fibromyalgia: Secondary | ICD-10-CM | POA: Diagnosis not present

## 2014-08-27 DIAGNOSIS — Z79899 Other long term (current) drug therapy: Secondary | ICD-10-CM | POA: Diagnosis not present

## 2014-08-27 DIAGNOSIS — G43909 Migraine, unspecified, not intractable, without status migrainosus: Secondary | ICD-10-CM | POA: Diagnosis not present

## 2014-08-28 DIAGNOSIS — H903 Sensorineural hearing loss, bilateral: Secondary | ICD-10-CM | POA: Diagnosis not present

## 2014-08-28 DIAGNOSIS — F329 Major depressive disorder, single episode, unspecified: Secondary | ICD-10-CM | POA: Diagnosis not present

## 2014-08-28 DIAGNOSIS — N3281 Overactive bladder: Secondary | ICD-10-CM | POA: Diagnosis not present

## 2014-08-28 DIAGNOSIS — G4730 Sleep apnea, unspecified: Secondary | ICD-10-CM | POA: Diagnosis not present

## 2014-08-28 DIAGNOSIS — M797 Fibromyalgia: Secondary | ICD-10-CM | POA: Diagnosis not present

## 2014-08-28 DIAGNOSIS — I519 Heart disease, unspecified: Secondary | ICD-10-CM | POA: Diagnosis not present

## 2014-08-30 DIAGNOSIS — E785 Hyperlipidemia, unspecified: Secondary | ICD-10-CM | POA: Diagnosis not present

## 2014-08-30 DIAGNOSIS — M797 Fibromyalgia: Secondary | ICD-10-CM | POA: Diagnosis not present

## 2014-08-30 DIAGNOSIS — D509 Iron deficiency anemia, unspecified: Secondary | ICD-10-CM | POA: Diagnosis not present

## 2014-08-30 DIAGNOSIS — Z952 Presence of prosthetic heart valve: Secondary | ICD-10-CM | POA: Diagnosis not present

## 2014-08-30 DIAGNOSIS — R51 Headache: Secondary | ICD-10-CM | POA: Diagnosis not present

## 2014-08-30 DIAGNOSIS — Q211 Atrial septal defect: Secondary | ICD-10-CM | POA: Diagnosis not present

## 2014-08-30 DIAGNOSIS — I2510 Atherosclerotic heart disease of native coronary artery without angina pectoris: Secondary | ICD-10-CM | POA: Diagnosis not present

## 2014-08-30 DIAGNOSIS — I10 Essential (primary) hypertension: Secondary | ICD-10-CM | POA: Diagnosis not present

## 2014-08-30 DIAGNOSIS — F329 Major depressive disorder, single episode, unspecified: Secondary | ICD-10-CM | POA: Diagnosis not present

## 2014-08-30 DIAGNOSIS — M810 Age-related osteoporosis without current pathological fracture: Secondary | ICD-10-CM | POA: Diagnosis not present

## 2014-08-30 DIAGNOSIS — H9190 Unspecified hearing loss, unspecified ear: Secondary | ICD-10-CM | POA: Diagnosis not present

## 2014-08-30 DIAGNOSIS — I059 Rheumatic mitral valve disease, unspecified: Secondary | ICD-10-CM | POA: Diagnosis not present

## 2014-09-04 ENCOUNTER — Encounter (HOSPITAL_COMMUNITY): Payer: Self-pay | Admitting: Emergency Medicine

## 2014-09-04 ENCOUNTER — Emergency Department (HOSPITAL_COMMUNITY)
Admission: EM | Admit: 2014-09-04 | Discharge: 2014-09-04 | Disposition: A | Discharge status: Home / Self Care | Payer: Medicare Other | Attending: Emergency Medicine | Admitting: Emergency Medicine

## 2014-09-04 DIAGNOSIS — R112 Nausea with vomiting, unspecified: Secondary | ICD-10-CM

## 2014-09-04 DIAGNOSIS — I10 Essential (primary) hypertension: Secondary | ICD-10-CM | POA: Insufficient documentation

## 2014-09-04 DIAGNOSIS — F329 Major depressive disorder, single episode, unspecified: Secondary | ICD-10-CM | POA: Diagnosis not present

## 2014-09-04 DIAGNOSIS — G43909 Migraine, unspecified, not intractable, without status migrainosus: Secondary | ICD-10-CM | POA: Diagnosis not present

## 2014-09-04 DIAGNOSIS — M1990 Unspecified osteoarthritis, unspecified site: Secondary | ICD-10-CM | POA: Insufficient documentation

## 2014-09-04 DIAGNOSIS — K219 Gastro-esophageal reflux disease without esophagitis: Secondary | ICD-10-CM | POA: Insufficient documentation

## 2014-09-04 DIAGNOSIS — H9190 Unspecified hearing loss, unspecified ear: Secondary | ICD-10-CM | POA: Diagnosis not present

## 2014-09-04 DIAGNOSIS — Z79899 Other long term (current) drug therapy: Secondary | ICD-10-CM | POA: Diagnosis not present

## 2014-09-04 DIAGNOSIS — F419 Anxiety disorder, unspecified: Secondary | ICD-10-CM | POA: Diagnosis not present

## 2014-09-04 DIAGNOSIS — Z862 Personal history of diseases of the blood and blood-forming organs and certain disorders involving the immune mechanism: Secondary | ICD-10-CM | POA: Insufficient documentation

## 2014-09-04 DIAGNOSIS — R1012 Left upper quadrant pain: Secondary | ICD-10-CM | POA: Diagnosis not present

## 2014-09-04 DIAGNOSIS — E559 Vitamin D deficiency, unspecified: Secondary | ICD-10-CM | POA: Insufficient documentation

## 2014-09-04 DIAGNOSIS — R1013 Epigastric pain: Secondary | ICD-10-CM | POA: Diagnosis not present

## 2014-09-04 DIAGNOSIS — Z86711 Personal history of pulmonary embolism: Secondary | ICD-10-CM | POA: Insufficient documentation

## 2014-09-04 DIAGNOSIS — Z792 Long term (current) use of antibiotics: Secondary | ICD-10-CM | POA: Insufficient documentation

## 2014-09-04 DIAGNOSIS — Z7982 Long term (current) use of aspirin: Secondary | ICD-10-CM | POA: Diagnosis not present

## 2014-09-04 DIAGNOSIS — K2970 Gastritis, unspecified, without bleeding: Secondary | ICD-10-CM | POA: Diagnosis not present

## 2014-09-04 LAB — I-STAT TROPONIN, ED: Troponin i, poc: 0 ng/mL (ref 0.00–0.08)

## 2014-09-04 LAB — URINALYSIS, ROUTINE W REFLEX MICROSCOPIC
Glucose, UA: NEGATIVE mg/dL
KETONES UR: 15 mg/dL — AB
Leukocytes, UA: NEGATIVE
Nitrite: NEGATIVE
PH: 6 (ref 5.0–8.0)
PROTEIN: 30 mg/dL — AB
Specific Gravity, Urine: 1.029 (ref 1.005–1.030)
Urobilinogen, UA: 0.2 mg/dL (ref 0.0–1.0)

## 2014-09-04 LAB — CBC WITH DIFFERENTIAL/PLATELET
Basophils Absolute: 0 10*3/uL (ref 0.0–0.1)
Basophils Relative: 1 % (ref 0–1)
Eosinophils Absolute: 0.1 10*3/uL (ref 0.0–0.7)
Eosinophils Relative: 2 % (ref 0–5)
HCT: 36.2 % (ref 36.0–46.0)
Hemoglobin: 12.6 g/dL (ref 12.0–15.0)
Lymphocytes Relative: 14 % (ref 12–46)
Lymphs Abs: 0.9 10*3/uL (ref 0.7–4.0)
MCH: 33.4 pg (ref 26.0–34.0)
MCHC: 34.8 g/dL (ref 30.0–36.0)
MCV: 96 fL (ref 78.0–100.0)
MONO ABS: 0.6 10*3/uL (ref 0.1–1.0)
Monocytes Relative: 9 % (ref 3–12)
NEUTROS PCT: 74 % (ref 43–77)
Neutro Abs: 4.6 10*3/uL (ref 1.7–7.7)
PLATELETS: 222 10*3/uL (ref 150–400)
RBC: 3.77 MIL/uL — ABNORMAL LOW (ref 3.87–5.11)
RDW: 13.9 % (ref 11.5–15.5)
WBC: 6.2 10*3/uL (ref 4.0–10.5)

## 2014-09-04 LAB — COMPREHENSIVE METABOLIC PANEL
ALBUMIN: 3.7 g/dL (ref 3.5–5.2)
ALK PHOS: 71 U/L (ref 39–117)
ALT: 15 U/L (ref 0–35)
AST: 23 U/L (ref 0–37)
Anion gap: 14 (ref 5–15)
BUN: 26 mg/dL — AB (ref 6–23)
CO2: 24 mmol/L (ref 19–32)
CREATININE: 1.05 mg/dL (ref 0.50–1.10)
Calcium: 8.7 mg/dL (ref 8.4–10.5)
Chloride: 99 mmol/L (ref 96–112)
GFR calc Af Amer: 69 mL/min — ABNORMAL LOW (ref >90)
GFR calc non Af Amer: 60 mL/min — ABNORMAL LOW (ref >90)
GLUCOSE: 78 mg/dL (ref 70–99)
POTASSIUM: 3.8 mmol/L (ref 3.5–5.1)
Sodium: 137 mmol/L (ref 135–145)
Total Bilirubin: 1 mg/dL (ref 0.3–1.2)
Total Protein: 6.8 g/dL (ref 6.0–8.3)

## 2014-09-04 LAB — URINE MICROSCOPIC-ADD ON

## 2014-09-04 LAB — LIPASE, BLOOD: LIPASE: 33 U/L (ref 11–59)

## 2014-09-04 MED ORDER — DICYCLOMINE HCL 20 MG PO TABS
20.0000 mg | ORAL_TABLET | Freq: Two times a day (BID) | ORAL | Status: DC
Start: 2014-09-04 — End: 2014-10-28

## 2014-09-04 MED ORDER — DIPHENHYDRAMINE HCL 50 MG/ML IJ SOLN
25.0000 mg | Freq: Once | INTRAMUSCULAR | Status: AC
  Administered 2014-09-04: 25 mg via INTRAVENOUS
  Filled 2014-09-04: qty 1

## 2014-09-04 MED ORDER — HYDROMORPHONE HCL 1 MG/ML IJ SOLN
1.0000 mg | Freq: Once | INTRAMUSCULAR | Status: AC
  Administered 2014-09-04: 1 mg via INTRAVENOUS
  Filled 2014-09-04: qty 1

## 2014-09-04 MED ORDER — ONDANSETRON HCL 4 MG PO TABS: 4.0000 mg | ORAL_TABLET | Freq: Three times a day (TID) | ORAL | Status: DC | PRN

## 2014-09-04 MED ORDER — PROCHLORPERAZINE EDISYLATE 5 MG/ML IJ SOLN
5.0000 mg | Freq: Once | INTRAMUSCULAR | Status: AC
  Administered 2014-09-04: 5 mg via INTRAVENOUS
  Filled 2014-09-04: qty 2

## 2014-09-04 MED ORDER — ONDANSETRON HCL 4 MG/2ML IJ SOLN
4.0000 mg | Freq: Once | INTRAMUSCULAR | Status: AC
  Administered 2014-09-04: 4 mg via INTRAVENOUS
  Filled 2014-09-04: qty 2

## 2014-09-04 MED ORDER — HYDROMORPHONE HCL 1 MG/ML IJ SOLN
0.5000 mg | Freq: Once | INTRAMUSCULAR | Status: AC
Start: 2014-09-04 — End: 2014-09-04
  Administered 2014-09-04: 0.5 mg via INTRAVENOUS
  Filled 2014-09-04: qty 1

## 2014-09-04 MED ORDER — GI COCKTAIL ~~LOC~~
30.0000 mL | Freq: Once | ORAL | Status: AC
  Administered 2014-09-04: 30 mL via ORAL
  Filled 2014-09-04: qty 30

## 2014-09-04 MED ORDER — FAMOTIDINE IN NACL 20-0.9 MG/50ML-% IV SOLN
20.0000 mg | Freq: Once | INTRAVENOUS | Status: AC
  Administered 2014-09-04: 20 mg via INTRAVENOUS
  Filled 2014-09-04: qty 50

## 2014-09-04 MED ORDER — SODIUM CHLORIDE 0.9 % IV SOLN
1000.0000 mL | Freq: Once | INTRAVENOUS | Status: AC
  Administered 2014-09-04: 1000 mL via INTRAVENOUS

## 2014-09-04 NOTE — ED Notes (Signed)
Assisted patient to rest room, collected urine sample.

## 2014-09-04 NOTE — ED Notes (Signed)
Patient coming from home with upper abdominal pain, headache, and vomiting x 5 that started around 03:00am.

## 2014-09-04 NOTE — Discharge Instructions (Signed)
Abdominal (belly) pain can be caused by many things. Your caregiver performed an examination and possibly ordered blood/urine tests and imaging (CT scan, x-rays, ultrasound). Many cases can be observed and treated at home after initial evaluation in the emergency department. Even though you are being discharged home, abdominal pain can be unpredictable. Therefore, you need a repeated exam if your pain does not resolve, returns, or worsens. Most patients with abdominal pain don't have to be admitted to the hospital or have surgery, but serious problems like appendicitis and gallbladder attacks can start out as nonspecific pain. Many abdominal conditions cannot be diagnosed in one visit, so follow-up evaluations are very important. SEEK IMMEDIATE MEDICAL ATTENTION IF: The pain does not go away or becomes severe.  A temperature above 101 develops.  Repeated vomiting occurs (multiple episodes).  The pain becomes localized to portions of the abdomen. The right side could possibly be appendicitis. In an adult, the left lower portion of the abdomen could be colitis or diverticulitis.  Blood is being passed in stools or vomit (bright red or black tarry stools).  Return also if you develop chest pain, difficulty breathing, dizziness or fainting, or become confused, poorly responsive, or inconsolable (young children).  Nausea and Vomiting Nausea is a sick feeling that often comes before throwing up (vomiting). Vomiting is a reflex where stomach contents come out of your mouth. Vomiting can cause severe loss of body fluids (dehydration). Children and elderly adults can become dehydrated quickly, especially if they also have diarrhea. Nausea and vomiting are symptoms of a condition or disease. It is important to find the cause of your symptoms. CAUSES   Direct irritation of the stomach lining. This irritation can result from increased acid production (gastroesophageal reflux disease), infection, food poisoning,  taking certain medicines (such as nonsteroidal anti-inflammatory drugs), alcohol use, or tobacco use.  Signals from the brain.These signals could be caused by a headache, heat exposure, an inner ear disturbance, increased pressure in the brain from injury, infection, a tumor, or a concussion, pain, emotional stimulus, or metabolic problems.  An obstruction in the gastrointestinal tract (bowel obstruction).  Illnesses such as diabetes, hepatitis, gallbladder problems, appendicitis, kidney problems, cancer, sepsis, atypical symptoms of a heart attack, or eating disorders.  Medical treatments such as chemotherapy and radiation.  Receiving medicine that makes you sleep (general anesthetic) during surgery. DIAGNOSIS Your caregiver may ask for tests to be done if the problems do not improve after a few days. Tests may also be done if symptoms are severe or if the reason for the nausea and vomiting is not clear. Tests may include:  Urine tests.  Blood tests.  Stool tests.  Cultures (to look for evidence of infection).  X-rays or other imaging studies. Test results can help your caregiver make decisions about treatment or the need for additional tests. TREATMENT You need to stay well hydrated. Drink frequently but in small amounts.You may wish to drink water, sports drinks, clear broth, or eat frozen ice pops or gelatin dessert to help stay hydrated.When you eat, eating slowly may help prevent nausea.There are also some antinausea medicines that may help prevent nausea. HOME CARE INSTRUCTIONS   Take all medicine as directed by your caregiver.  If you do not have an appetite, do not force yourself to eat. However, you must continue to drink fluids.  If you have an appetite, eat a normal diet unless your caregiver tells you differently.  Eat a variety of complex carbohydrates (rice, wheat, potatoes, bread), lean  meats, yogurt, fruits, and vegetables.  Avoid high-fat foods because they  are more difficult to digest.  Drink enough water and fluids to keep your urine clear or pale yellow.  If you are dehydrated, ask your caregiver for specific rehydration instructions. Signs of dehydration may include:  Severe thirst.  Dry lips and mouth.  Dizziness.  Dark urine.  Decreasing urine frequency and amount.  Confusion.  Rapid breathing or pulse. SEEK IMMEDIATE MEDICAL CARE IF:   You have blood or brown flecks (like coffee grounds) in your vomit.  You have black or bloody stools.  You have a severe headache or stiff neck.  You are confused.  You have severe abdominal pain.  You have chest pain or trouble breathing.  You do not urinate at least once every 8 hours.  You develop cold or clammy skin.  You continue to vomit for longer than 24 to 48 hours.  You have a fever. MAKE SURE YOU:   Understand these instructions.  Will watch your condition.  Will get help right away if you are not doing well or get worse. Document Released: 04/26/2005 Document Revised: 07/19/2011 Document Reviewed: 09/23/2010 [facility] Patient Information 2015 [place], [place]. This information is not intended to replace advice given to you by your health care provider. Make sure you discuss any questions you have with your health care provider.  Gastritis, Adult Gastritis is soreness and swelling (inflammation) of the lining of the stomach. Gastritis can develop as a sudden onset (acute) or long-term (chronic) condition. If gastritis is not treated, it can lead to stomach bleeding and ulcers. CAUSES  Gastritis occurs when the stomach lining is weak or damaged. Digestive juices from the stomach then inflame the weakened stomach lining. The stomach lining may be weak or damaged due to viral or bacterial infections. One common bacterial infection is the Helicobacter pylori infection. Gastritis can also result from excessive alcohol consumption, taking certain medicines, or having too much  acid in the stomach.  SYMPTOMS  In some cases, there are no symptoms. When symptoms are present, they may include:  Pain or a burning sensation in the upper abdomen.  Nausea.  Vomiting.  An uncomfortable feeling of fullness after eating. DIAGNOSIS  Your caregiver may suspect you have gastritis based on your symptoms and a physical exam. To determine the cause of your gastritis, your caregiver may perform the following:  Blood or stool tests to check for the H pylori bacterium.  Gastroscopy. A thin, flexible tube (endoscope) is passed down the esophagus and into the stomach. The endoscope has a light and camera on the end. Your caregiver uses the endoscope to view the inside of the stomach.  Taking a tissue sample (biopsy) from the stomach to examine under a microscope. TREATMENT  Depending on the cause of your gastritis, medicines may be prescribed. If you have a bacterial infection, such as an H pylori infection, antibiotics may be given. If your gastritis is caused by too much acid in the stomach, H2 blockers or antacids may be given. Your caregiver may recommend that you stop taking aspirin, ibuprofen, or other nonsteroidal anti-inflammatory drugs (NSAIDs). HOME CARE INSTRUCTIONS  Only take over-the-counter or prescription medicines as directed by your caregiver.  If you were given antibiotic medicines, take them as directed. Finish them even if you start to feel better.  Drink enough fluids to keep your urine clear or pale yellow.  Avoid foods and drinks that make your symptoms worse, such as:  Caffeine or alcoholic  drinks.  Chocolate.  Peppermint or mint flavorings.  Garlic and onions.  Spicy foods.  Citrus fruits, such as oranges, lemons, or limes.  Tomato-based foods such as sauce, chili, salsa, and pizza.  Fried and fatty foods.  Eat small, frequent meals instead of large meals. SEEK IMMEDIATE MEDICAL CARE IF:   You have black or dark red stools.  You vomit  blood or material that looks like coffee grounds.  You are unable to keep fluids down.  Your abdominal pain gets worse.  You have a fever.  You do not feel better after 1 week.  You have any other questions or concerns. MAKE SURE YOU:  Understand these instructions.  Will watch your condition.  Will get help right away if you are not doing well or get worse. Document Released: 04/20/2001 Document Revised: 10/26/2011 Document Reviewed: 06/09/2011 [facility] Patient Information 2015 [place], [place]. This information is not intended to replace advice given to you by your health care provider. Make sure you discuss any questions you have with your health care provider.

## 2014-09-04 NOTE — ED Provider Notes (Signed)
CSN: [id number]     Arrival date & time 09/04/14  0603 History   First MD Initiated Contact with Patient 09/04/14 [id number]     Chief Complaint  Patient presents with  . Abdominal Pain  . Headache  . Emesis     (Consider location/radiation/quality/duration/timing/severity/associated sxs/prior Treatment) HPI   [name] is a(n) 54 y.o. female who presents is a 54 y/o female with a pmh of gastritis, Hearing impairment, Cardiomyopathy, anxiety, GERD,, migraine, fibromyalgia. SHe is s/p cochlear implant surgery 5 days ago and s/p cholecystectomy/ appendectormy many years ago. She present c/o sudden onset epigastric abdominal pain, nausea.Patient had 5-8 episodes of vomiting NBNB vomitus. No diarrhea. denies CP/ SOB, or hx of CAD. She has chills without fever, myalgias or other systemic sxs. She denies urinary sxs.   Past Medical History  Diagnosis Date  . Depression   . Migraine   . Fibromyalgia   . Hearing impairment   . Arthritis   . Cardiomyopathy   . Pulmonary embolism   . Gastritis   . Osteoporosis   . Anemia   . Allergy     SEASONAL  . Anxiety   . GERD (gastroesophageal reflux disease)   . Hypertension     [name], take htn medication to regulate heart beat.  . Sleep apnea   . Vitamin D deficiency    Past Surgical History  Procedure Laterality Date  . Appendectomy    . Inner ear surgery      TUBES  . Tonsillectomy and adenoidectomy    . Tubal ligation    . Laparoscopic cholecystectomy  2012  . Cardiac surgery  January 2007    mitral valve repair  . Cardiac surgery  1973    atrial septal defect   Family History  Problem Relation Age of Onset  . Breast cancer Mother     bilateral; ages 30 and 21; TAH/BSO ~50  . Depression Sister   . Heart disease Father   . Hypertension Father   . Colon cancer Neg Hx   . Esophageal cancer Neg Hx   . Pancreatic cancer Neg Hx   . Rectal cancer Neg Hx   . Stomach cancer Neg Hx    History  Substance Use Topics  . Smoking  status: Never Smoker   . Smokeless tobacco: Never Used  . Alcohol Use: No   OB History    Gravida Para Term Preterm AB TAB SAB Ectopic Multiple Living   2 2 2       2      Review of Systems   Ten systems reviewed and are negative for acute change, except as noted in the HPI.  Allergies  Depakote and Valium  Home Medications   Prior to Admission medications   Medication Sig Start Date End Date Taking? Authorizing Provider  aspirin EC 81 MG tablet Take 81 mg by mouth every morning.   Yes Historical Provider, MD  buPROPion (WELLBUTRIN XL) 150 MG 24 hr tablet Take 3 tablets (450 mg total) by mouth every morning. 07/04/14  Yes Norma Fredrickson, MD  butalbital-acetaminophen-caffeine (FIORICET, ESGIC) 50-325-40 MG per tablet Take 1 tablet by mouth daily as needed for migraine (migraine). For migraines 01/16/14  Yes Historical Provider, MD  carvedilol (COREG) 25 MG tablet TAKE 1 TABLET BY MOUTH TWICE A DAY WITH MEALS 05/06/14  Yes Jerline Pain, MD  cephALEXin (KEFLEX) 500 MG capsule Take 500 mg by mouth 3 (three) times daily.  08/27/14  Yes Historical Provider, MD  hydrochlorothiazide (MICROZIDE) 12.5 MG capsule Take 1 capsule (12.5 mg total) by mouth every morning. 05/16/14  Yes Jerline Pain, MD  loratadine (CLARITIN) 10 MG tablet Take 10 mg by mouth daily.   Yes Historical Provider, MD  mirabegron ER (MYRBETRIQ) 50 MG TB24 tablet Take 1 tablet (50 mg total) by mouth daily. 07/17/14  Yes Terrance Mass, MD  pantoprazole (PROTONIX) 40 MG tablet Take 1 tablet (40 mg total) by mouth daily. 10/29/13  Yes Milus Banister, MD  Vitamin D, Ergocalciferol, (DRISDOL) 50000 UNITS CAPS capsule Take 1 capsule (50,000 Units total) by mouth every 7 (seven) days. 07/18/14  Yes Terrance Mass, MD  fluconazole (DIFLUCAN) 100 MG tablet Take 1 tablet (100 mg total) by mouth daily. Patient not taking: Reported on 09/04/2014 08/21/14   Milus Banister, MD   BP 148/97 mmHg  Pulse 99  Temp(Src) 98.2 F (36.8 C) (Oral)   Resp 17  Ht 5\' 4"  (1.626 m)  Wt 152 lb (68.947 kg)  BMI 26.08 kg/m2  SpO2 100% Physical Exam  Constitutional: She is oriented to person, place, and time. She appears well-developed and well-nourished. No distress.  HENT:  Head: Normocephalic and atraumatic.  Eyes: Conjunctivae are normal. No scleral icterus.  Neck: Normal range of motion.  Bruising   Cardiovascular: Normal rate, regular rhythm and normal heart sounds.  Exam reveals no gallop and no friction rub.   No murmur heard. Pulmonary/Chest: Effort normal and breath sounds normal. No respiratory distress.  Abdominal: Soft. Bowel sounds are normal. She exhibits no distension and no mass. There is tenderness. There is no guarding.  Epigastrium and LUQ.  Neurological: She is alert and oriented to person, place, and time.  Skin: Skin is warm and dry. She is not diaphoretic.  Nursing note and vitals reviewed.   ED Course  Procedures (including critical care time) Labs Review Labs Reviewed  CBC WITH DIFFERENTIAL/PLATELET - Abnormal; Notable for the following:    RBC 3.77 (*)    All other components within normal limits  COMPREHENSIVE METABOLIC PANEL  LIPASE, BLOOD  URINALYSIS, ROUTINE W REFLEX MICROSCOPIC  I-STAT TROPOININ, ED    Imaging Review No results found.   EKG Interpretation   Date/Time:  Wednesday September 04 2014 06:17:15 EDT Ventricular Rate:  98 PR Interval:  181 QRS Duration: 81 QT Interval:  355 QTC Calculation: 453 R Axis:   51 Text Interpretation:  Sinus rhythm Borderline repolarization abnormality  When compared with ECG of 05/20/2014, QT has shortened Confirmed by [facility]   MD, DAVID ([id number]) on 09/04/2014 6:41:39 AM      MDM   Final diagnoses:  Gastritis  Non-intractable vomiting with nausea, vomiting of unspecified type     BP 152/83 mmHg  Pulse 99  Temp(Src) 98 F (36.7 C) (Oral)  Resp 19  Ht 5\' 4"  (1.626 m)  Wt 152 lb (68.947 kg)  BMI 26.08 kg/m2  SpO2 100% Patient with epigastric  abdominal pain.  Labs are reassuring.  Pain meds and antiemetics givne along with pepcid. ekg without acute abnormality. Negative tropinin. She is tolerating fluids and gastric pain is greatly relieved  Patient is nontoxic, nonseptic appearing, in no apparent distress.  Patient's pain and other symptoms adequately managed in emergency department.  Fluid bolus given.  Labs, imaging and vitals reviewed.  Patient does not meet the SIRS or Sepsis criteria.  On repeat exam patient does not have a surgical abdomin and there are no peritoneal signs.  No indication of appendicitis,  bowel obstruction, bowel perforation, cholecystitis, diverticulitis, PID or ectopic pregnancy.  Patient discharged home with symptomatic treatment and given strict instructions for follow-up with their primary care physician.  I have also discussed reasons to return immediately to the ER.  Patient expresses understanding and agrees with plan.   Margarita Mail, PA-C [id number]/63/[id number]  Delora Fuel, MD 32/95/18 [id number]

## 2014-09-04 NOTE — ED Notes (Signed)
EKG given to Dr. Roxanne Mins and PA Kenton Kingfisher.

## 2014-09-08 ENCOUNTER — Encounter (HOSPITAL_COMMUNITY): Payer: Self-pay

## 2014-09-08 ENCOUNTER — Emergency Department (HOSPITAL_COMMUNITY): Payer: Medicare Other

## 2014-09-08 ENCOUNTER — Emergency Department (HOSPITAL_COMMUNITY)
Admission: EM | Admit: 2014-09-08 | Discharge: 2014-09-08 | Disposition: A | Discharge status: Home / Self Care | Payer: Medicare Other | Attending: Emergency Medicine | Admitting: Emergency Medicine

## 2014-09-08 DIAGNOSIS — Z8719 Personal history of other diseases of the digestive system: Secondary | ICD-10-CM | POA: Diagnosis not present

## 2014-09-08 DIAGNOSIS — F329 Major depressive disorder, single episode, unspecified: Secondary | ICD-10-CM | POA: Insufficient documentation

## 2014-09-08 DIAGNOSIS — Z862 Personal history of diseases of the blood and blood-forming organs and certain disorders involving the immune mechanism: Secondary | ICD-10-CM | POA: Insufficient documentation

## 2014-09-08 DIAGNOSIS — F419 Anxiety disorder, unspecified: Secondary | ICD-10-CM | POA: Insufficient documentation

## 2014-09-08 DIAGNOSIS — H9190 Unspecified hearing loss, unspecified ear: Secondary | ICD-10-CM | POA: Insufficient documentation

## 2014-09-08 DIAGNOSIS — G43909 Migraine, unspecified, not intractable, without status migrainosus: Secondary | ICD-10-CM | POA: Insufficient documentation

## 2014-09-08 DIAGNOSIS — Z792 Long term (current) use of antibiotics: Secondary | ICD-10-CM | POA: Insufficient documentation

## 2014-09-08 DIAGNOSIS — Z79899 Other long term (current) drug therapy: Secondary | ICD-10-CM | POA: Insufficient documentation

## 2014-09-08 DIAGNOSIS — Z7982 Long term (current) use of aspirin: Secondary | ICD-10-CM | POA: Insufficient documentation

## 2014-09-08 DIAGNOSIS — R1013 Epigastric pain: Secondary | ICD-10-CM | POA: Insufficient documentation

## 2014-09-08 DIAGNOSIS — E559 Vitamin D deficiency, unspecified: Secondary | ICD-10-CM | POA: Insufficient documentation

## 2014-09-08 DIAGNOSIS — Z7951 Long term (current) use of inhaled steroids: Secondary | ICD-10-CM | POA: Diagnosis not present

## 2014-09-08 DIAGNOSIS — Z86711 Personal history of pulmonary embolism: Secondary | ICD-10-CM | POA: Insufficient documentation

## 2014-09-08 DIAGNOSIS — Z9889 Other specified postprocedural states: Secondary | ICD-10-CM | POA: Insufficient documentation

## 2014-09-08 DIAGNOSIS — R112 Nausea with vomiting, unspecified: Secondary | ICD-10-CM

## 2014-09-08 DIAGNOSIS — I10 Essential (primary) hypertension: Secondary | ICD-10-CM | POA: Diagnosis not present

## 2014-09-08 DIAGNOSIS — M1990 Unspecified osteoarthritis, unspecified site: Secondary | ICD-10-CM | POA: Diagnosis not present

## 2014-09-08 LAB — CBC WITH DIFFERENTIAL/PLATELET
Basophils Absolute: 0 10*3/uL (ref 0.0–0.1)
Basophils Relative: 1 % (ref 0–1)
EOS PCT: 1 % (ref 0–5)
Eosinophils Absolute: 0 10*3/uL (ref 0.0–0.7)
HEMATOCRIT: 40.2 % (ref 36.0–46.0)
Hemoglobin: 14.1 g/dL (ref 12.0–15.0)
Lymphocytes Relative: 14 % (ref 12–46)
Lymphs Abs: 0.6 10*3/uL — ABNORMAL LOW (ref 0.7–4.0)
MCH: 34.1 pg — ABNORMAL HIGH (ref 26.0–34.0)
MCHC: 35.1 g/dL (ref 30.0–36.0)
MCV: 97.1 fL (ref 78.0–100.0)
Monocytes Absolute: 0.2 10*3/uL (ref 0.1–1.0)
Monocytes Relative: 5 % (ref 3–12)
NEUTROS ABS: 3.7 10*3/uL (ref 1.7–7.7)
Neutrophils Relative %: 79 % — ABNORMAL HIGH (ref 43–77)
Platelets: ADEQUATE 10*3/uL (ref 150–400)
RBC: 4.14 MIL/uL (ref 3.87–5.11)
RDW: 14 % (ref 11.5–15.5)
WBC: 4.5 10*3/uL (ref 4.0–10.5)

## 2014-09-08 LAB — URINE MICROSCOPIC-ADD ON

## 2014-09-08 LAB — COMPREHENSIVE METABOLIC PANEL
ALT: 19 U/L (ref 14–54)
AST: 34 U/L (ref 15–41)
Albumin: 4.5 g/dL (ref 3.5–5.0)
Alkaline Phosphatase: 88 U/L (ref 38–126)
Anion gap: 12 (ref 5–15)
BUN: 24 mg/dL — ABNORMAL HIGH (ref 6–20)
CALCIUM: 9.9 mg/dL (ref 8.9–10.3)
CO2: 26 mmol/L (ref 22–32)
CREATININE: 0.88 mg/dL (ref 0.44–1.00)
Chloride: 102 mmol/L (ref 101–111)
GFR calc Af Amer: >60 mL/min (ref >60)
GFR calc non Af Amer: >60 mL/min (ref >60)
Glucose, Bld: 88 mg/dL (ref 70–99)
Potassium: 4.2 mmol/L (ref 3.5–5.1)
SODIUM: 140 mmol/L (ref 135–145)
TOTAL PROTEIN: 7.8 g/dL (ref 6.5–8.1)
Total Bilirubin: 1.1 mg/dL (ref 0.3–1.2)

## 2014-09-08 LAB — URINALYSIS, ROUTINE W REFLEX MICROSCOPIC
BILIRUBIN URINE: NEGATIVE
Glucose, UA: NEGATIVE mg/dL
KETONES UR: NEGATIVE mg/dL
Leukocytes, UA: NEGATIVE
NITRITE: NEGATIVE
Protein, ur: NEGATIVE mg/dL
SPECIFIC GRAVITY, URINE: 1.025 (ref 1.005–1.030)
UROBILINOGEN UA: 0.2 mg/dL (ref 0.0–1.0)
pH: 7.5 (ref 5.0–8.0)

## 2014-09-08 LAB — I-STAT TROPONIN, ED: Troponin i, poc: 0 ng/mL (ref 0.00–0.08)

## 2014-09-08 LAB — LIPASE, BLOOD: Lipase: 41 U/L (ref 22–51)

## 2014-09-08 MED ORDER — PANTOPRAZOLE SODIUM 40 MG IV SOLR
40.0000 mg | Freq: Once | INTRAVENOUS | Status: AC
  Administered 2014-09-08: 40 mg via INTRAVENOUS
  Filled 2014-09-08: qty 40

## 2014-09-08 MED ORDER — ONDANSETRON HCL 4 MG/2ML IJ SOLN
4.0000 mg | Freq: Once | INTRAMUSCULAR | Status: AC
  Administered 2014-09-08: 4 mg via INTRAVENOUS
  Filled 2014-09-08: qty 2

## 2014-09-08 MED ORDER — HYDROCODONE-ACETAMINOPHEN 5-325 MG PO TABS: 1.0000 | ORAL_TABLET | ORAL | Status: DC | PRN

## 2014-09-08 MED ORDER — HYDROMORPHONE HCL 1 MG/ML IJ SOLN
1.0000 mg | Freq: Once | INTRAMUSCULAR | Status: AC
  Administered 2014-09-08: 1 mg via INTRAVENOUS
  Filled 2014-09-08: qty 1

## 2014-09-08 MED ORDER — ONDANSETRON HCL 4 MG PO TABS: 4.0000 mg | ORAL_TABLET | Freq: Three times a day (TID) | ORAL | Status: DC | PRN

## 2014-09-08 MED ORDER — MORPHINE SULFATE 4 MG/ML IJ SOLN
4.0000 mg | Freq: Once | INTRAMUSCULAR | Status: AC
  Administered 2014-09-08: 4 mg via INTRAVENOUS
  Filled 2014-09-08: qty 1

## 2014-09-08 MED ORDER — SODIUM CHLORIDE 0.9 % IV SOLN
1000.0000 mL | Freq: Once | INTRAVENOUS | Status: AC
  Administered 2014-09-08: 1000 mL via INTRAVENOUS

## 2014-09-08 MED ORDER — GI COCKTAIL ~~LOC~~
30.0000 mL | Freq: Once | ORAL | Status: AC
Start: 2014-09-08 — End: 2014-09-08
  Administered 2014-09-08: 30 mL via ORAL
  Filled 2014-09-08: qty 30

## 2014-09-08 MED ORDER — SUCRALFATE 1 G PO TABS: 1.0000 g | ORAL_TABLET | Freq: Three times a day (TID) | ORAL | Status: DC

## 2014-09-08 MED ORDER — SODIUM CHLORIDE 0.9 % IV SOLN
1000.0000 mL | INTRAVENOUS | Status: DC
  Administered 2014-09-08: 1000 mL via INTRAVENOUS

## 2014-09-08 MED ORDER — PANTOPRAZOLE SODIUM 40 MG PO TBEC: 40.0000 mg | DELAYED_RELEASE_TABLET | Freq: Every day | ORAL | Status: DC

## 2014-09-08 NOTE — ED Notes (Signed)
Pt states that morphine usually doesn't help her, but Dilaudid does.

## 2014-09-08 NOTE — ED Notes (Signed)
She states she has been vomiting occasionally x 2 days; with persistent, dull epigastric discomfort.  Seen here for similar s/s just over a week ago.  She also cites recent cochlear implant surgery.

## 2014-09-08 NOTE — ED Provider Notes (Signed)
CSN: [id number]     Arrival date & time 09/08/14  1700 History   First MD Initiated Contact with Patient 09/08/14 1742     Chief Complaint  Patient presents with  . Emesis    HPI Patient presents to the emergency room with complaints of nausea vomiting and epigastric abdominal pain. She had some more symptoms earlier in the week. She was seen in the emergency room and released. The symptoms improved but then yesterday she started to have recurrence. Patient has a sharp aching discomfort in her upper epigastric region. She also has constant nausea. She's had several episodes of vomiting today, between 5-10. Had a couple loose stools. No abdominal bloating. She denies any dysuria or fevers or chills. No chest pain or shortness of breath. Patient has had similar symptoms in the past and has had an endoscopy. She was told she had gastritis. Past Medical History  Diagnosis Date  . Depression   . Migraine   . Fibromyalgia   . Hearing impairment   . Arthritis   . Cardiomyopathy   . Pulmonary embolism   . Gastritis   . Osteoporosis   . Anemia   . Allergy     SEASONAL  . Anxiety   . GERD (gastroesophageal reflux disease)   . Hypertension     [name], take htn medication to regulate heart beat.  . Sleep apnea   . Vitamin D deficiency    Past Surgical History  Procedure Laterality Date  . Appendectomy    . Inner ear surgery      TUBES  . Tonsillectomy and adenoidectomy    . Tubal ligation    . Laparoscopic cholecystectomy  2012  . Cardiac surgery  January 2007    mitral valve repair  . Cardiac surgery  1973    atrial septal defect  . Cochlear implant     Family History  Problem Relation Age of Onset  . Breast cancer Mother     bilateral; ages 44 and 74; TAH/BSO ~50  . Depression Sister   . Heart disease Father   . Hypertension Father   . Colon cancer Neg Hx   . Esophageal cancer Neg Hx   . Pancreatic cancer Neg Hx   . Rectal cancer Neg Hx   . Stomach cancer Neg Hx     History  Substance Use Topics  . Smoking status: Never Smoker   . Smokeless tobacco: Never Used  . Alcohol Use: No   OB History    Gravida Para Term Preterm AB TAB SAB Ectopic Multiple Living   2 2 2       2      Review of Systems  All other systems reviewed and are negative.     Allergies  Depakote and Valium  Home Medications   Prior to Admission medications   Medication Sig Start Date End Date Taking? Authorizing Provider  aspirin EC 81 MG tablet Take 81 mg by mouth every morning.   Yes Historical Provider, MD  buPROPion (WELLBUTRIN XL) 150 MG 24 hr tablet Take 3 tablets (450 mg total) by mouth every morning. 07/04/14  Yes Norma Fredrickson, MD  carvedilol (COREG) 25 MG tablet TAKE 1 TABLET BY MOUTH TWICE A DAY WITH MEALS 05/06/14  Yes Jerline Pain, MD  cephALEXin (KEFLEX) 500 MG capsule Take 500 mg by mouth 3 (three) times daily.  08/27/14  Yes Historical Provider, MD  fluticasone (FLONASE) 50 MCG/ACT nasal spray Place 1 spray into both nostrils daily as  needed for allergies or rhinitis.   Yes Historical Provider, MD  hydrochlorothiazide (MICROZIDE) 12.5 MG capsule Take 1 capsule (12.5 mg total) by mouth every morning. 05/16/14  Yes Jerline Pain, MD  loratadine (CLARITIN) 10 MG tablet Take 10 mg by mouth daily.   Yes Historical Provider, MD  mirabegron ER (MYRBETRIQ) 50 MG TB24 tablet Take 1 tablet (50 mg total) by mouth daily. 07/17/14  Yes Terrance Mass, MD  ofloxacin (OCUFLOX) 0.3 % ophthalmic solution Place 5 drops into the right ear 2 (two) times daily. 09/06/14  Yes Historical Provider, MD  Omega-3 Fatty Acids (FISH OIL) 1000 MG CAPS Take 1 capsule by mouth daily.   Yes Historical Provider, MD  pantoprazole (PROTONIX) 40 MG tablet Take 1 tablet (40 mg total) by mouth daily. 10/29/13  Yes Milus Banister, MD  Vitamin D, Ergocalciferol, (DRISDOL) 50000 UNITS CAPS capsule Take 1 capsule (50,000 Units total) by mouth every 7 (seven) days. 07/18/14  Yes Terrance Mass, MD   dicyclomine (BENTYL) 20 MG tablet Take 1 tablet (20 mg total) by mouth 2 (two) times daily. Patient not taking: Reported on 09/08/2014 09/04/14   Margarita Mail, PA-C  fluconazole (DIFLUCAN) 100 MG tablet Take 1 tablet (100 mg total) by mouth daily. Patient not taking: Reported on 09/04/2014 08/21/14   Milus Banister, MD  ondansetron (ZOFRAN) 4 MG tablet Take 1 tablet (4 mg total) by mouth every 8 (eight) hours as needed for nausea or vomiting. Patient not taking: Reported on 09/08/2014 09/04/14   Margarita Mail, PA-C   BP 160/110 mmHg  Pulse 109  Temp(Src) 98.7 F (37.1 C) (Oral)  Resp 20  SpO2 99% Physical Exam  Constitutional: She appears well-developed and well-nourished. No distress.  HENT:  Head: Normocephalic and atraumatic.  Right Ear: External ear normal.  Left Ear: External ear normal.  Eyes: Conjunctivae are normal. Right eye exhibits no discharge. Left eye exhibits no discharge. No scleral icterus.  Neck: Neck supple. No tracheal deviation present.  Cardiovascular: Normal rate, regular rhythm and intact distal pulses.   Pulmonary/Chest: Effort normal and breath sounds normal. No stridor. No respiratory distress. She has no wheezes. She has no rales.  Bruising noted anterior chest wall, patient states she's had that since her recent cochlear surgery  Abdominal: Soft. Bowel sounds are normal. She exhibits no distension. There is tenderness (mild) in the epigastric area. There is no rigidity, no rebound and no guarding. No hernia.  Musculoskeletal: She exhibits no edema or tenderness.  Neurological: She is alert. She has normal strength. No cranial nerve deficit (no facial droop, extraocular movements intact, no slurred speech) or sensory deficit. She exhibits normal muscle tone. She displays no seizure activity. Coordination normal.  Skin: Skin is warm and dry. No rash noted.  Psychiatric: She has a normal mood and affect.  Nursing note and vitals reviewed.   ED Course   Procedures (including critical care time) Labs Review Labs Reviewed  CBC WITH DIFFERENTIAL/PLATELET - Abnormal; Notable for the following:    MCH 34.1 (*)    Neutrophils Relative % 79 (*)    Lymphs Abs 0.6 (*)    All other components within normal limits  COMPREHENSIVE METABOLIC PANEL - Abnormal; Notable for the following:    BUN 24 (*)    All other components within normal limits  URINALYSIS, ROUTINE W REFLEX MICROSCOPIC - Abnormal; Notable for the following:    APPearance CLOUDY (*)    Hgb urine dipstick TRACE (*)  All other components within normal limits  URINE MICROSCOPIC-ADD ON - Abnormal; Notable for the following:    Bacteria, UA FEW (*)    All other components within normal limits  LIPASE, BLOOD  I-STAT TROPOININ, ED    Imaging Review Dg Abd Acute W/chest  09/08/2014   CLINICAL DATA:  Nausea/vomiting x2 days  EXAM: DG ABDOMEN ACUTE W/ 1V CHEST  COMPARISON:  None.  FINDINGS: Lungs are clear.  No pleural effusion or pneumothorax.  The heart is normal in size. Postsurgical changes related to prior CABG. Prosthetic valve.  Nonobstructive bowel gas pattern. No evidence of free air under the diaphragm on the upright view.  Cholecystectomy clips.  Degenerative changes of the lower lumbar spine.  IMPRESSION: No evidence of acute cardiopulmonary disease.  No evidence of small bowel obstruction or free air.   Electronically Signed   By: Julian Hy M.D.   On: 09/08/2014 18:40     EKG Interpretation   Date/Time:  Sunday Sep 08 2014 18:03:21 EDT Ventricular Rate:  102 PR Interval:  175 QRS Duration: 81 QT Interval:  390 QTC Calculation: 508 R Axis:   66 Text Interpretation:  Sinus tachycardia Borderline repolarization  abnormality Borderline prolonged QT interval No significant change since  last tracing Confirmed by Eeshan Verbrugge  MD-J, Emsley Custer ([id number]) on 09/08/2014 6:11:22 PM     Medications  0.9 %  sodium chloride infusion (1,000 mLs Intravenous New Bag/Given 09/08/14 1840)     Followed by  0.9 %  sodium chloride infusion (0 mLs Intravenous Stopped 09/08/14 1938)  HYDROmorphone (DILAUDID) injection 1 mg (not administered)  gi cocktail (Maalox,Lidocaine,Donnatal) (not administered)  ondansetron (ZOFRAN) injection 4 mg (not administered)  ondansetron (ZOFRAN) injection 4 mg (4 mg Intravenous Given 09/08/14 1841)  morphine 4 MG/ML injection 4 mg (4 mg Intravenous Given 09/08/14 1842)  pantoprazole (PROTONIX) injection 40 mg (40 mg Intravenous Given 09/08/14 1843)    MDM   Final diagnoses:  Non-intractable vomiting with nausea, vomiting of unspecified type    Pt's xrays do not show any signs of obstruction.  Labs are unremarkable.  Non uti.   No dehydration.  Lipase normal.  Pt does have histrory of gastritis.  Will start her on antacids.  At this time there does not appear to be any evidence of an acute emergency medical condition and the patient appears stable for discharge with appropriate outpatient follow up.     Dorie Rank, MD 09/08/14 2015

## 2014-09-08 NOTE — Discharge Instructions (Signed)

## 2014-09-10 DIAGNOSIS — G43719 Chronic migraine without aura, intractable, without status migrainosus: Secondary | ICD-10-CM | POA: Diagnosis not present

## 2014-09-10 DIAGNOSIS — G43111 Migraine with aura, intractable, with status migrainosus: Secondary | ICD-10-CM | POA: Diagnosis not present

## 2014-09-10 NOTE — Telephone Encounter (Signed)
Open dates for Reclast May 20 ,24,25,26,27. Must be after 05/19.  Blood work on 09/08/14  BUN=24,  Creatinine 0.88, Calcium 9.9. We will schedule patient for Reclast when labs are approved by Dr. Toney Rakes.

## 2014-09-10 NOTE — Telephone Encounter (Signed)
Find out who her primary MD is. She has continued elevated BUN. Will need for her kidney function to be assessed before her next dose. Her calcium level was fine. Will need a Vit D level drawn.

## 2014-09-11 NOTE — Telephone Encounter (Signed)
Absolutely correct is Reclast

## 2014-09-11 NOTE — Telephone Encounter (Signed)
Left message for pt to call.

## 2014-09-11 NOTE — Telephone Encounter (Signed)
Per [facility] staff message:  "Anderson Malta, please inform [name] that we need to hold off on her Prolia until her Dr, .Adaku Nnodi, MD evaluate her elevated BUN (kidney function) further before administering her the medication. Please make sure she makes an appointment to see him and if he is not in network send them on her labs so he can see and made sure that she is cleared to receive the medication. Please let me now.

## 2014-09-11 NOTE — Telephone Encounter (Signed)
She had Vit D on 03/ 09/16 = 10.0    At her annual exam with Dr Toney Rakes. Your note was sent to me from [place]. This patient was just in the hospital at [facility] and had ??? surgery . I saw this on care everywhere.   Her primary care provider is Adaku Nnodi, MD.  I will hold Reclast per your note.

## 2014-09-11 NOTE — Telephone Encounter (Signed)
Patient informed it is Reclast we are holding off on until she is evaluated by PCP.  She will call her PCP at [facility] at [facility]. Dr. Orland Penman no longer there but she is seeing someone else. I will fax results to them.

## 2014-09-11 NOTE — Telephone Encounter (Signed)
I believe Dr. Sandrea Hughs note below should read need to hold off on Reclast and not Prolia. I see no mention of Prolia in the prior attached messages and patient did not know what Prolia was. Please advise. Thanks

## 2014-09-13 DIAGNOSIS — Z9621 Cochlear implant status: Secondary | ICD-10-CM | POA: Diagnosis not present

## 2014-09-13 DIAGNOSIS — H903 Sensorineural hearing loss, bilateral: Secondary | ICD-10-CM | POA: Diagnosis not present

## 2014-09-16 DIAGNOSIS — H18793 Other corneal deformities, bilateral: Secondary | ICD-10-CM | POA: Diagnosis not present

## 2014-09-17 DIAGNOSIS — R899 Unspecified abnormal finding in specimens from other organs, systems and tissues: Secondary | ICD-10-CM | POA: Diagnosis not present

## 2014-09-18 ENCOUNTER — Ambulatory Visit (INDEPENDENT_AMBULATORY_CARE_PROVIDER_SITE_OTHER): Payer: Medicare Other | Admitting: Psychiatry

## 2014-09-18 VITALS — Ht 64.0 in

## 2014-09-18 DIAGNOSIS — F331 Major depressive disorder, recurrent, moderate: Secondary | ICD-10-CM

## 2014-09-18 HISTORY — DX: Major depressive disorder, recurrent, moderate: F33.1

## 2014-09-18 MED ORDER — BUPROPION HCL ER (XL) 150 MG PO TB24: 450.0000 mg | ORAL_TABLET | Freq: Every morning | ORAL | Status: DC

## 2014-09-18 MED ORDER — DESVENLAFAXINE SUCCINATE ER 50 MG PO TB24: 50.0000 mg | ORAL_TABLET | Freq: Every day | ORAL | Status: DC

## 2014-09-18 NOTE — Progress Notes (Signed)
[facility] MD Progress Note  09/18/2014 1:36 PM [name]  MRN:  [id number] Subjective: Doing well The patient is back on her Wellbutrin at 450 mg. She claims since she's back her mood is much improved. Overall she 60% improve his she's not back to baseline. She continues asleep any well. She's got good concentration. She denies any psychotic symptoms at all. She is a good sense of worth. She lives with her 53 year old daughter alone. The patient has a good appetite. The patient has just begun and a headache clinic and has a cochlear implant in place for her chronic hearing problems. The patient says she's getting injections from the headache clinic for her headaches. Otherwise the patient denies chest pain or shortness of breath she is no other physical complaints. It is noted the patient used to be on Pristiq and Abilify. The patient demonstrates no evidence of alcohol use or drug use. Apparently in the past she was in our IOP program for substance abuse. This does not seem to be an issue at this time. The patient continues in therapy in the community. The patient denies being suicidal. The patient feels well. She still does not feel back to her baseline and it should be noted this patient has had 2 psychiatric hospitalizations. Her last visit she come back after being off of all psychotropic medications. My wishes to get her back to her baseline. Principal Problem:Major depression Diagnosis:   Patient Active Problem List   Diagnosis Date Noted  . Non-traumatic compression fracture of vertebral column with routine healing [M48.50XD] 07/17/2014  . Osteoporosis [M81.0] 07/17/2014  . H/O vitamin D deficiency [Z86.39] 07/17/2014  . Odynophagia [R13.10] 07/09/2014  . Dysphagia [R13.10] 07/08/2014  . Chest pain [R07.9] 05/20/2014  . Nausea & vomiting [R11.2] 05/20/2014  . 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 Clinical depression [F32.9] 03/29/2014  . Breath  shortness [R06.02] 03/29/2014  . Hypercholesterolemia without hypertriglyceridemia [E78.0] 03/29/2014  . Fatigue [R53.83] 03/29/2014  . Classical migraine with intractable migraine [G43.119] 03/29/2014  . Disorder of mitral valve [I05.9] 03/29/2014  . Atypical migraine [G43.809] 03/29/2014  . Atrial septal defect of fossa ovalis [Q21.1] 03/29/2014  . HLD (hyperlipidemia) [E78.5] 03/29/2014  . Arthralgia of multiple joints [M25.50] 03/29/2014  . Anemia, iron deficiency [D50.9] 03/29/2014  . Fibrositis [M79.7] 03/29/2014  . Abdominal pain [R10.9] 12/20/2013  . Absolute anemia [D64.9] 09/20/2013  . Bronchitis [J40] 09/20/2013  . Vaginitis and vulvovaginitis [N76.0] 09/20/2013  . Family history of breast cancer [Z80.3] 07/13/2013  . Palpitation [R00.2] 07/02/2013  . History of mitral valve repair [Z98.89] 07/02/2013  . Status post patch closure of ASD [Z98.89] 07/02/2013  . Hyperlipidemia [E78.5] 07/02/2013  . OAB (overactive bladder) [N32.81] 06/13/2013  . Ovarian cyst, left [N83.20] 03/15/2013  . Vaginal lesion [N89.8] 03/15/2013  . Ovarian mass [N83.9] 03/15/2013  . Maternal DVT (deep vein thrombosis), history of [Z86.718] 02/26/2013  . PMB (postmenopausal bleeding) [N95.0] 02/26/2013  . Alcohol dependence [F10.20] 11/07/2012  . Unspecified vitamin D deficiency [E55.9] 08/17/2012  . L1 vertebral fracture [S32.019A] 08/01/2012  . Spinal compression fracture [M48.50XA] 07/12/2012  . Menopausal state [N95.1] 07/12/2012  . Psoriasis [L40.9] 07/12/2012  . Major depressive disorder, recurrent episode, severe, without mention of psychotic behavior [F33.2] 12/07/2011  . Generalized anxiety disorder [F41.1] 12/07/2011  . Cardiomyopathy [425] 03/16/2011  . Hearing impairment [H91.90]   . Migraine [346]   . Arthritis [M19.90]    Total Time spent with patient: 30 minutes   Past Medical  History:  Past Medical History  Diagnosis Date  . Depression   . Migraine   . Fibromyalgia   .  Hearing impairment   . Arthritis   . Cardiomyopathy   . Pulmonary embolism   . Gastritis   . Osteoporosis   . Anemia   . Allergy     SEASONAL  . Anxiety   . GERD (gastroesophageal reflux disease)   . Hypertension     [name], take htn medication to regulate heart beat.  . Sleep apnea   . Vitamin D deficiency     Past Surgical History  Procedure Laterality Date  . Appendectomy    . Inner ear surgery      TUBES  . Tonsillectomy and adenoidectomy    . Tubal ligation    . Laparoscopic cholecystectomy  2012  . Cardiac surgery  January 2007    mitral valve repair  . Cardiac surgery  1973    atrial septal defect  . Cochlear implant     Family History:  Family History  Problem Relation Age of Onset  . Breast cancer Mother     bilateral; ages 16 and 39; TAH/BSO ~50  . Depression Sister   . Heart disease Father   . Hypertension Father   . Colon cancer Neg Hx   . Esophageal cancer Neg Hx   . Pancreatic cancer Neg Hx   . Rectal cancer Neg Hx   . Stomach cancer Neg Hx    Social History:  History  Alcohol Use No     History  Drug Use No    History   Social History  . Marital Status: Divorced    Spouse Name: N/A  . Number of Children: N/A  . Years of Education: N/A   Social History Main Topics  . Smoking status: Never Smoker   . Smokeless tobacco: Never Used  . Alcohol Use: No  . Drug Use: No  . Sexual Activity: Yes    Birth Control/ Protection: Surgical     Comment: 1st intercourse- 19, partners- 22   Other Topics Concern  . Not on file   Social History Narrative   Additional History:    Sleep: Good  Appetite:  Good   Assessment:   Musculoskeletal: Strength & Muscle Tone:  Gait & Station:  Patient leans:    Psychiatric Specialty Exam: Physical Exam  ROS  Height 5\' 4"  (1.626 m).There is no weight on file to calculate BMI.  General Appearance: Casual  Eye Contact::  Good  Speech:  Clear and Coherent  Volume:  Normal  Mood:  Euthymic   Affect:  Appropriate  Thought Process:  Coherent  Orientation:  Full (Time, Place, and Person)  Thought Content:  WDL  Suicidal Thoughts:  No  Homicidal Thoughts:  No  Memory:  NA  Judgement:  Good  Insight:  Good  Psychomotor Activity:  Normal  Concentration:  Good  Recall:  Good  Fund of Knowledge:Good  Language: Good  Akathisia:  No  Handed:  Right  AIMS (if indicated):     Assets:  Desire for Improvement  ADL's:  Intact  Cognition: WNL  Sleep:        Current Medications: Current Outpatient Prescriptions  Medication Sig Dispense Refill  . aspirin EC 81 MG tablet Take 81 mg by mouth every morning.    [name] buPROPion (WELLBUTRIN XL) 150 MG 24 hr tablet Take 3 tablets (450 mg total) by mouth every morning. 90 tablet 3  . carvedilol (COREG)  25 MG tablet TAKE 1 TABLET BY MOUTH TWICE A DAY WITH MEALS 60 tablet 3  . cephALEXin (KEFLEX) 500 MG capsule Take 500 mg by mouth 3 (three) times daily.   0  . desvenlafaxine (PRISTIQ) 50 MG 24 hr tablet Take 1 tablet (50 mg total) by mouth daily. 30 tablet 4  . dicyclomine (BENTYL) 20 MG tablet Take 1 tablet (20 mg total) by mouth 2 (two) times daily. (Patient not taking: Reported on 09/08/2014) 20 tablet 0  . fluconazole (DIFLUCAN) 100 MG tablet Take 1 tablet (100 mg total) by mouth daily. (Patient not taking: Reported on 09/04/2014) 20 tablet 3  . fluticasone (FLONASE) 50 MCG/ACT nasal spray Place 1 spray into both nostrils daily as needed for allergies or rhinitis.    . hydrochlorothiazide (MICROZIDE) 12.5 MG capsule Take 1 capsule (12.5 mg total) by mouth every morning. 90 capsule 2  . HYDROcodone-acetaminophen (NORCO/VICODIN) 5-325 MG per tablet Take 1-2 tablets by mouth every 4 (four) hours as needed. 16 tablet 0  . loratadine (CLARITIN) 10 MG tablet Take 10 mg by mouth daily.    . mirabegron ER (MYRBETRIQ) 50 MG TB24 tablet Take 1 tablet (50 mg total) by mouth daily. 30 tablet 11  . ofloxacin (OCUFLOX) 0.3 % ophthalmic solution Place 5  drops into the right ear 2 (two) times daily.    . Omega-3 Fatty Acids (FISH OIL) 1000 MG CAPS Take 1 capsule by mouth daily.    . ondansetron (ZOFRAN) 4 MG tablet Take 1 tablet (4 mg total) by mouth every 8 (eight) hours as needed for nausea or vomiting. 10 tablet 0  . pantoprazole (PROTONIX) 40 MG tablet Take 1 tablet (40 mg total) by mouth daily. 90 tablet 3  . sucralfate (CARAFATE) 1 G tablet Take 1 tablet (1 g total) by mouth 4 (four) times daily -  with meals and at bedtime. 28 tablet 0  . Vitamin D, Ergocalciferol, (DRISDOL) 50000 UNITS CAPS capsule Take 1 capsule (50,000 Units total) by mouth every 7 (seven) days. 12 capsule 0   No current facility-administered medications for this visit.    Lab Results: No results found for this or any previous visit (from the past 48 hour(s)).  Physical Findings: AIMS:  , ,  ,  ,    CIWA:    COWS:     Treatment Plan Summary: At this time the patient will continue taking 450 mg of Wellbutrin. Today we'll go ahead and start her back on Pristiq 50 mg. This is the lowest starting dose of this agent. The patient will continue in therapy with Donalee Citrin in the community. At this time the patient is doing very well. She shall return to see me in approximately 8 weeks.   Medical Decision Making:  Self-Limited or Minor (1)     Amberlynn Tempesta IRVING 09/18/2014, 1:36 PM

## 2014-09-19 DIAGNOSIS — M791 Myalgia: Secondary | ICD-10-CM | POA: Diagnosis not present

## 2014-09-19 DIAGNOSIS — R51 Headache: Secondary | ICD-10-CM | POA: Diagnosis not present

## 2014-09-19 DIAGNOSIS — G43719 Chronic migraine without aura, intractable, without status migrainosus: Secondary | ICD-10-CM | POA: Diagnosis not present

## 2014-09-19 DIAGNOSIS — G43111 Migraine with aura, intractable, with status migrainosus: Secondary | ICD-10-CM | POA: Diagnosis not present

## 2014-09-19 DIAGNOSIS — M542 Cervicalgia: Secondary | ICD-10-CM | POA: Diagnosis not present

## 2014-09-19 DIAGNOSIS — G518 Other disorders of facial nerve: Secondary | ICD-10-CM | POA: Diagnosis not present

## 2014-09-19 DIAGNOSIS — Z780 Asymptomatic menopausal state: Secondary | ICD-10-CM | POA: Diagnosis not present

## 2014-09-23 ENCOUNTER — Telehealth (HOSPITAL_COMMUNITY): Payer: Self-pay

## 2014-09-23 DIAGNOSIS — F331 Major depressive disorder, recurrent, moderate: Secondary | ICD-10-CM

## 2014-09-23 NOTE — Telephone Encounter (Signed)
Telephone call with patient after she left a message she could not afford Pristiq she was to restart as would cost more than $300 per month.  Questioned if medication needed a prior authorization and agreed to call patient's [facility] pharmacy to follow up.  Spoke with Maudie Mercury, Pharmacist at [facility] on [address] who reported patient did not need a prior authorization but rather that the Pristiq medication was not on patient's formulary and not covered at all so she would have to pay full price.  Called patient back to inform of what the pharmacist instructed and agreed to send to Dr. Casimiro Needle to inform patient stated she could not afford over $300 per month for the medication and to question if other medication options in its place.

## 2014-09-24 ENCOUNTER — Encounter: Payer: Self-pay | Admitting: Gynecology

## 2014-09-24 NOTE — Telephone Encounter (Signed)
Returned pt phone call. She has had repeat lab work at PCP Yaakov Guthrie, MD.) [name] ([place]), Repeat Lab work BUN= 16.0  Calcium= 9.0  Dr. [name] has cleared her for RECLAST per Dr Toney Rakes request. We do not have a copy of this pt will fax copy.

## 2014-09-25 MED ORDER — DULOXETINE HCL 60 MG PO CPEP: 60.0000 mg | ORAL_CAPSULE | Freq: Every day | ORAL | Status: DC

## 2014-09-25 NOTE — Telephone Encounter (Signed)
Met with Dr. Casimiro Needle to discuss patient's status with Pristiq and report she just could not afford what the medication would cost her each month as her insurance does not offer the medication on her formulary at all.  Dr. Casimiro Needle discontinued patient's Pristiq and started her on Cymbalta 84m one daily.  Patient given 2 refills to last patient until her return appointment on 11/22/14 and to called her to review the medication.  Patient to call back if any problems starting the new medication.  Patient's Pristiq order discontinued with her pharmacy through e-scription when Cymbalta order sent.

## 2014-09-25 NOTE — Telephone Encounter (Signed)
Received information from Dr Yaakov Guthrie, MD  Repeat Lab BUN 16.0   Calcium 9.0    Creatinine 1.13  All within normal. Per note elevation was due to dehydration. She can receive her infusion for osteoporosis now.  Need to ok this note with Dr Toney Rakes, I explained this to patient today . I will schedule her infusion per Dr Toney Rakes ok.

## 2014-09-26 DIAGNOSIS — G5600 Carpal tunnel syndrome, unspecified upper limb: Secondary | ICD-10-CM | POA: Diagnosis not present

## 2014-09-26 DIAGNOSIS — G629 Polyneuropathy, unspecified: Secondary | ICD-10-CM | POA: Diagnosis not present

## 2014-09-26 NOTE — Telephone Encounter (Signed)
She can continue her Reclast

## 2014-09-26 NOTE — Telephone Encounter (Signed)
Phone call to patient regarding Reclast infusion . Scheduled at [facility] at 12:00 pm on 10/03/14. Instructions have been mailed to patient. Physicians orders have been faxed with insurance and recent lab work from PCP.

## 2014-09-30 DIAGNOSIS — G629 Polyneuropathy, unspecified: Secondary | ICD-10-CM | POA: Diagnosis not present

## 2014-10-02 ENCOUNTER — Other Ambulatory Visit (HOSPITAL_COMMUNITY): Payer: Self-pay | Admitting: *Deleted

## 2014-10-03 ENCOUNTER — Ambulatory Visit (HOSPITAL_COMMUNITY)
Admission: RE | Admit: 2014-10-03 | Discharge: 2014-10-03 | Disposition: A | Discharge status: Home / Self Care | Payer: Medicare Other | Source: Ambulatory Visit | Attending: Gynecology | Admitting: Gynecology

## 2014-10-03 DIAGNOSIS — M810 Age-related osteoporosis without current pathological fracture: Secondary | ICD-10-CM | POA: Insufficient documentation

## 2014-10-03 MED ORDER — ZOLEDRONIC ACID 5 MG/100ML IV SOLN
INTRAVENOUS | Status: AC
  Administered 2014-10-03: 5 mg via INTRAVENOUS
  Filled 2014-10-03: qty 100

## 2014-10-03 MED ORDER — ZOLEDRONIC ACID 5 MG/100ML IV SOLN
5.0000 mg | Freq: Once | INTRAVENOUS | Status: AC
  Administered 2014-10-03: 5 mg via INTRAVENOUS

## 2014-10-04 DIAGNOSIS — M542 Cervicalgia: Secondary | ICD-10-CM | POA: Diagnosis not present

## 2014-10-04 DIAGNOSIS — G43111 Migraine with aura, intractable, with status migrainosus: Secondary | ICD-10-CM | POA: Diagnosis not present

## 2014-10-04 DIAGNOSIS — M791 Myalgia: Secondary | ICD-10-CM | POA: Diagnosis not present

## 2014-10-04 DIAGNOSIS — G43719 Chronic migraine without aura, intractable, without status migrainosus: Secondary | ICD-10-CM | POA: Diagnosis not present

## 2014-10-04 DIAGNOSIS — R51 Headache: Secondary | ICD-10-CM | POA: Diagnosis not present

## 2014-10-04 DIAGNOSIS — G518 Other disorders of facial nerve: Secondary | ICD-10-CM | POA: Diagnosis not present

## 2014-10-06 ENCOUNTER — Encounter (HOSPITAL_COMMUNITY): Payer: Self-pay | Admitting: Emergency Medicine

## 2014-10-06 ENCOUNTER — Emergency Department (HOSPITAL_COMMUNITY)
Admission: EM | Admit: 2014-10-06 | Discharge: 2014-10-06 | Disposition: A | Discharge status: Home / Self Care | Payer: Medicare Other | Attending: Emergency Medicine | Admitting: Emergency Medicine

## 2014-10-06 DIAGNOSIS — Z86711 Personal history of pulmonary embolism: Secondary | ICD-10-CM | POA: Insufficient documentation

## 2014-10-06 DIAGNOSIS — F419 Anxiety disorder, unspecified: Secondary | ICD-10-CM | POA: Insufficient documentation

## 2014-10-06 DIAGNOSIS — R1013 Epigastric pain: Secondary | ICD-10-CM

## 2014-10-06 DIAGNOSIS — Z79899 Other long term (current) drug therapy: Secondary | ICD-10-CM | POA: Insufficient documentation

## 2014-10-06 DIAGNOSIS — R112 Nausea with vomiting, unspecified: Secondary | ICD-10-CM | POA: Diagnosis not present

## 2014-10-06 DIAGNOSIS — F329 Major depressive disorder, single episode, unspecified: Secondary | ICD-10-CM | POA: Insufficient documentation

## 2014-10-06 DIAGNOSIS — Z7982 Long term (current) use of aspirin: Secondary | ICD-10-CM | POA: Insufficient documentation

## 2014-10-06 DIAGNOSIS — K219 Gastro-esophageal reflux disease without esophagitis: Secondary | ICD-10-CM | POA: Insufficient documentation

## 2014-10-06 DIAGNOSIS — H9190 Unspecified hearing loss, unspecified ear: Secondary | ICD-10-CM | POA: Diagnosis not present

## 2014-10-06 DIAGNOSIS — I10 Essential (primary) hypertension: Secondary | ICD-10-CM | POA: Insufficient documentation

## 2014-10-06 DIAGNOSIS — M1990 Unspecified osteoarthritis, unspecified site: Secondary | ICD-10-CM | POA: Diagnosis not present

## 2014-10-06 DIAGNOSIS — Z862 Personal history of diseases of the blood and blood-forming organs and certain disorders involving the immune mechanism: Secondary | ICD-10-CM | POA: Diagnosis not present

## 2014-10-06 DIAGNOSIS — M810 Age-related osteoporosis without current pathological fracture: Secondary | ICD-10-CM | POA: Insufficient documentation

## 2014-10-06 DIAGNOSIS — G43909 Migraine, unspecified, not intractable, without status migrainosus: Secondary | ICD-10-CM | POA: Insufficient documentation

## 2014-10-06 DIAGNOSIS — E559 Vitamin D deficiency, unspecified: Secondary | ICD-10-CM | POA: Diagnosis not present

## 2014-10-06 LAB — CBC WITH DIFFERENTIAL/PLATELET
Basophils Absolute: 0 10*3/uL (ref 0.0–0.1)
Basophils Relative: 1 % (ref 0–1)
EOS PCT: 1 % (ref 0–5)
Eosinophils Absolute: 0.1 10*3/uL (ref 0.0–0.7)
HCT: 45 % (ref 36.0–46.0)
HEMOGLOBIN: 15.2 g/dL — AB (ref 12.0–15.0)
Lymphocytes Relative: 20 % (ref 12–46)
Lymphs Abs: 1 10*3/uL (ref 0.7–4.0)
MCH: 34.3 pg — AB (ref 26.0–34.0)
MCHC: 33.8 g/dL (ref 30.0–36.0)
MCV: 101.6 fL — ABNORMAL HIGH (ref 78.0–100.0)
Monocytes Absolute: 0.3 10*3/uL (ref 0.1–1.0)
Monocytes Relative: 7 % (ref 3–12)
Neutro Abs: 3.4 10*3/uL (ref 1.7–7.7)
Neutrophils Relative %: 71 % (ref 43–77)
Platelets: 122 10*3/uL — ABNORMAL LOW (ref 150–400)
RBC: 4.43 MIL/uL (ref 3.87–5.11)
RDW: 14.6 % (ref 11.5–15.5)
WBC: 4.8 10*3/uL (ref 4.0–10.5)

## 2014-10-06 LAB — COMPREHENSIVE METABOLIC PANEL
ALT: 25 U/L (ref 14–54)
AST: 28 U/L (ref 15–41)
Albumin: 4.3 g/dL (ref 3.5–5.0)
Alkaline Phosphatase: 76 U/L (ref 38–126)
Anion gap: 14 (ref 5–15)
BUN: 16 mg/dL (ref 6–20)
CHLORIDE: 103 mmol/L (ref 101–111)
CO2: 26 mmol/L (ref 22–32)
Calcium: 9.6 mg/dL (ref 8.9–10.3)
Creatinine, Ser: 0.6 mg/dL (ref 0.44–1.00)
GFR calc Af Amer: >60 mL/min (ref >60)
GFR calc non Af Amer: >60 mL/min (ref >60)
GLUCOSE: 97 mg/dL (ref 65–99)
POTASSIUM: 3.7 mmol/L (ref 3.5–5.1)
Sodium: 143 mmol/L (ref 135–145)
Total Bilirubin: 0.4 mg/dL (ref 0.3–1.2)
Total Protein: 7.3 g/dL (ref 6.5–8.1)

## 2014-10-06 LAB — LIPASE, BLOOD: Lipase: 35 U/L (ref 22–51)

## 2014-10-06 MED ORDER — ONDANSETRON HCL 4 MG/2ML IJ SOLN
4.0000 mg | Freq: Once | INTRAMUSCULAR | Status: AC
  Administered 2014-10-06: 4 mg via INTRAVENOUS
  Filled 2014-10-06: qty 2

## 2014-10-06 MED ORDER — FENTANYL CITRATE (PF) 100 MCG/2ML IJ SOLN
100.0000 ug | INTRAMUSCULAR | Status: AC | PRN
  Administered 2014-10-06 (×2): 100 ug via INTRAVENOUS
  Filled 2014-10-06 (×3): qty 2

## 2014-10-06 MED ORDER — PANTOPRAZOLE SODIUM 40 MG IV SOLR
40.0000 mg | Freq: Once | INTRAVENOUS | Status: AC
  Administered 2014-10-06: 40 mg via INTRAVENOUS
  Filled 2014-10-06: qty 40

## 2014-10-06 MED ORDER — SODIUM CHLORIDE 0.9 % IV BOLUS (SEPSIS)
1000.0000 mL | Freq: Once | INTRAVENOUS | Status: AC
  Administered 2014-10-06: 1000 mL via INTRAVENOUS

## 2014-10-06 MED ORDER — SODIUM CHLORIDE 0.9 % IV SOLN
INTRAVENOUS | Status: DC
  Administered 2014-10-06: 12:00:00 via INTRAVENOUS

## 2014-10-06 NOTE — ED Provider Notes (Addendum)
CSN: [id number]     Arrival date & time 10/06/14  1132 History   First MD Initiated Contact with Patient 10/06/14 1144     Chief Complaint  Patient presents with  . Abdominal Pain     (Consider location/radiation/quality/duration/timing/severity/associated sxs/prior Treatment) HPI   [name] is a 54 y.o. female who presents for evaluation of upper abdominal pain, with decreased appetite and a single episode of both vomiting and diarrhea. The symptoms are similar to those that she has had several times before. In the last 2 days. She has not taken her PPI medications because she didn't want to put anything on her stomach. She feels like she needs Dilaudid and IV fluids for her discomfort. She denies cough, chest pain, weakness or dizziness.  She was recently changed from [place], to Cymbalta, to treat her depressive symptoms.  There are no other known modifying factors.  Past Medical History  Diagnosis Date  . Depression   . Migraine   . Fibromyalgia   . Hearing impairment   . Arthritis   . Cardiomyopathy   . Pulmonary embolism   . Gastritis   . Osteoporosis   . Anemia   . Allergy     SEASONAL  . Anxiety   . GERD (gastroesophageal reflux disease)   . Hypertension     [name], take htn medication to regulate heart beat.  . Sleep apnea   . Vitamin D deficiency    Past Surgical History  Procedure Laterality Date  . Appendectomy    . Inner ear surgery      TUBES  . Tonsillectomy and adenoidectomy    . Tubal ligation    . Laparoscopic cholecystectomy  2012  . Cardiac surgery  January 2007    mitral valve repair  . Cardiac surgery  1973    atrial septal defect  . Cochlear implant     Family History  Problem Relation Age of Onset  . Breast cancer Mother     bilateral; ages 49 and 107; TAH/BSO ~50  . Depression Sister   . Heart disease Father   . Hypertension Father   . Colon cancer Neg Hx   . Esophageal cancer Neg Hx   . Pancreatic cancer Neg Hx   . Rectal  cancer Neg Hx   . Stomach cancer Neg Hx    History  Substance Use Topics  . Smoking status: Never Smoker   . Smokeless tobacco: Never Used  . Alcohol Use: No   OB History    Gravida Para Term Preterm AB TAB SAB Ectopic Multiple Living   2 2 2       2      Review of Systems  All other systems reviewed and are negative.     Allergies  Depakote and Valium  Home Medications   Prior to Admission medications   Medication Sig Start Date End Date Taking? Authorizing Provider  acetaminophen (TYLENOL) 500 MG tablet Take 1,000 mg by mouth every 6 (six) hours as needed for moderate pain or headache.   Yes Historical Provider, MD  aspirin EC 81 MG tablet Take 81 mg by mouth every morning.   Yes Historical Provider, MD  buPROPion (WELLBUTRIN XL) 150 MG 24 hr tablet Take 3 tablets (450 mg total) by mouth every morning. 09/18/14  Yes Norma Fredrickson, MD  carvedilol (COREG) 25 MG tablet TAKE 1 TABLET BY MOUTH TWICE A DAY WITH MEALS 05/06/14  Yes Jerline Pain, MD  DULoxetine (CYMBALTA) 60 MG capsule  Take 1 capsule (60 mg total) by mouth daily. 09/25/14 09/25/15 Yes Norma Fredrickson, MD  fluticasone (FLONASE) 50 MCG/ACT nasal spray Place 1 spray into both nostrils daily as needed for allergies or rhinitis.   Yes Historical Provider, MD  gabapentin (NEURONTIN) 100 MG capsule Take 1 capsule by mouth 2 (two) times daily as needed (headache.).  09/10/14  Yes Historical Provider, MD  gabapentin (NEURONTIN) 300 MG capsule Take 600 mg by mouth at bedtime. 09/10/14  Yes Historical Provider, MD  hydrochlorothiazide (MICROZIDE) 12.5 MG capsule Take 1 capsule (12.5 mg total) by mouth every morning. 05/16/14  Yes Jerline Pain, MD  loratadine (CLARITIN) 10 MG tablet Take 10 mg by mouth daily as needed for allergies.    Yes Historical Provider, MD  mirabegron ER (MYRBETRIQ) 50 MG TB24 tablet Take 1 tablet (50 mg total) by mouth daily. 07/17/14  Yes Terrance Mass, MD  Omega-3 Fatty Acids (FISH OIL) 1000 MG CAPS Take 1  capsule by mouth daily.   Yes Historical Provider, MD  pantoprazole (PROTONIX) 40 MG tablet Take 1 tablet (40 mg total) by mouth daily. 09/08/14  Yes Dorie Rank, MD  Vitamin D, Ergocalciferol, (DRISDOL) 50000 UNITS CAPS capsule Take 1 capsule (50,000 Units total) by mouth every 7 (seven) days. 07/18/14  Yes Terrance Mass, MD  dicyclomine (BENTYL) 20 MG tablet Take 1 tablet (20 mg total) by mouth 2 (two) times daily. Patient not taking: Reported on 09/08/2014 09/04/14   Margarita Mail, PA-C  fluconazole (DIFLUCAN) 100 MG tablet Take 1 tablet (100 mg total) by mouth daily. Patient not taking: Reported on 09/04/2014 08/21/14   Milus Banister, MD  HYDROcodone-acetaminophen (NORCO/VICODIN) 5-325 MG per tablet Take 1-2 tablets by mouth every 4 (four) hours as needed. Patient not taking: Reported on 10/06/2014 09/08/14   Dorie Rank, MD  ondansetron (ZOFRAN) 4 MG tablet Take 1 tablet (4 mg total) by mouth every 8 (eight) hours as needed for nausea or vomiting. Patient not taking: Reported on 10/06/2014 09/08/14   Dorie Rank, MD  sucralfate (CARAFATE) 1 G tablet Take 1 tablet (1 g total) by mouth 4 (four) times daily -  with meals and at bedtime. Patient not taking: Reported on 10/06/2014 09/08/14   Dorie Rank, MD   BP 168/115 mmHg  Pulse 117  Resp 18  SpO2 100% Physical Exam  Constitutional: She is oriented to person, place, and time. She appears well-developed and well-nourished. No distress.  HENT:  Head: Normocephalic and atraumatic.  Right Ear: External ear normal.  Left Ear: External ear normal.  Eyes: Conjunctivae and EOM are normal. Pupils are equal, round, and reactive to light.  Neck: Normal range of motion and phonation normal. Neck supple.  Cardiovascular: Normal rate, regular rhythm and normal heart sounds.   Pulmonary/Chest: Effort normal and breath sounds normal. She exhibits no bony tenderness.  Abdominal: Soft. She exhibits no mass. There is tenderness (Epigastric, mild). There is no rebound and  no guarding.  Musculoskeletal: Normal range of motion. She exhibits no edema or tenderness.  Neurological: She is alert and oriented to person, place, and time. No cranial nerve deficit or sensory deficit. She exhibits normal muscle tone. Coordination normal.  Skin: Skin is warm, dry and intact.  Psychiatric: She has a normal mood and affect. Her behavior is normal. Judgment and thought content normal.  Nursing note and vitals reviewed.   ED Course  Procedures (including critical care time)  Medications  0.9 %  sodium chloride infusion ( Intravenous  New Bag/Given 10/06/14 1228)  fentaNYL (SUBLIMAZE) injection 100 mcg (100 mcg Intravenous Given 10/06/14 1328)  sodium chloride 0.9 % bolus 1,000 mL (1,000 mLs Intravenous New Bag/Given 10/06/14 1228)  ondansetron (ZOFRAN) injection 4 mg (4 mg Intravenous Given 10/06/14 1229)  pantoprazole (PROTONIX) injection 40 mg (40 mg Intravenous Given 10/06/14 1229)    Patient Vitals for the past 24 hrs:  BP Temp src Pulse Resp SpO2  10/06/14 1137 (!) 168/115 mmHg Oral 117 18 100 %    1:25 PM Reevaluation with update and discussion. After initial assessment and treatment, an updated evaluation reveals she is still uncomfortable, and would like some narcotic pain medication. Shambhavi Salley L   2:25 PM Reevaluation with update and discussion. After initial assessment and treatment, an updated evaluation reveals no additional complaints, she is comfortable at this time. [place] Review Labs Reviewed  CBC WITH DIFFERENTIAL/PLATELET - Abnormal; Notable for the following:    Hemoglobin 15.2 (*)    MCV 101.6 (*)    MCH 34.3 (*)    Platelets 122 (*)    All other components within normal limits  COMPREHENSIVE METABOLIC PANEL  LIPASE, BLOOD    Imaging Review No results found.   EKG Interpretation None      MDM   Final diagnoses:  Non-intractable vomiting with nausea, vomiting of unspecified type  Epigastric pain    Nonspecific  pain, and anorexia, with minimal gastrointestinal symptoms. Doubt peptic ulcer disease, colitis, serious bacterial infection or metabolic instability.  Nursing Notes Reviewed/ Care Coordinated Applicable Imaging Reviewed Interpretation of Laboratory Data incorporated into ED treatment  The patient appears reasonably screened and/or stabilized for discharge and I doubt any other medical condition or other [facility] requiring further screening, evaluation, or treatment in the ED at this time prior to discharge.  Plan: Home Medications- usual; Home Treatments- rest; return here if the recommended treatment, does not improve the symptoms; Recommended follow up- PCP of choice prn     Daleen Bo, MD 10/06/14 1426  Daleen Bo, MD 10/06/14 1426

## 2014-10-06 NOTE — ED Notes (Addendum)
Patient here with complaints of "severe" central abd pain for 3 days. Nausea,  no vomiting. Hx of GERD

## 2014-10-06 NOTE — Discharge Instructions (Signed)
Abdominal Pain °Many things can cause abdominal pain. Usually, abdominal pain is not caused by a disease and will improve without treatment. It can often be observed and treated at home. Your health care provider will do a physical exam and possibly order blood tests and X-rays to help determine the seriousness of your pain. However, in many cases, more time must pass before a clear cause of the pain can be found. Before that point, your health care provider may not know if you need more testing or further treatment. °HOME CARE INSTRUCTIONS  °Monitor your abdominal pain for any changes. The following actions may help to alleviate any discomfort you are experiencing: °· Only take over-the-counter or prescription medicines as directed by your health care provider. °· Do not take laxatives unless directed to do so by your health care provider. °· Try a clear liquid diet (broth, tea, or water) as directed by your health care provider. Slowly move to a bland diet as tolerated. °SEEK MEDICAL CARE IF: °· You have unexplained abdominal pain. °· You have abdominal pain associated with nausea or diarrhea. °· You have pain when you urinate or have a bowel movement. °· You experience abdominal pain that wakes you in the night. °· You have abdominal pain that is worsened or improved by eating food. °· You have abdominal pain that is worsened with eating fatty foods. °· You have a fever. °SEEK IMMEDIATE MEDICAL CARE IF:  °· Your pain does not go away within 2 hours. °· You keep throwing up (vomiting). °· Your pain is felt only in portions of the abdomen, such as the right side or the left lower portion of the abdomen. °· You pass bloody or black tarry stools. °MAKE SURE YOU: °· Understand these instructions.   °· Will watch your condition.   °· Will get help right away if you are not doing well or get worse.   °Document Released: 02/03/2005 Document Revised: 05/01/2013 Document Reviewed: 01/03/2013 °[employer]® Patient Information  ©2015 [employer]. This information is not intended to replace advice given to you by your health care provider. Make sure you discuss any questions you have with your health care provider. ° °Nausea and Vomiting °Nausea is a sick feeling that often comes before throwing up (vomiting). Vomiting is a reflex where stomach contents come out of your mouth. Vomiting can cause severe loss of body fluids (dehydration). Children and elderly adults can become dehydrated quickly, especially if they also have diarrhea. Nausea and vomiting are symptoms of a condition or disease. It is important to find the cause of your symptoms. °CAUSES  °· Direct irritation of the stomach lining. This irritation can result from increased acid production (gastroesophageal reflux disease), infection, food poisoning, taking certain medicines (such as nonsteroidal anti-inflammatory drugs), alcohol use, or tobacco use. °· Signals from the brain. These signals could be caused by a headache, heat exposure, an inner ear disturbance, increased pressure in the brain from injury, infection, a tumor, or a concussion, pain, emotional stimulus, or metabolic problems. °· An obstruction in the gastrointestinal tract (bowel obstruction). °· Illnesses such as diabetes, hepatitis, gallbladder problems, appendicitis, kidney problems, cancer, sepsis, atypical symptoms of a heart attack, or eating disorders. °· Medical treatments such as chemotherapy and radiation. °· Receiving medicine that makes you sleep (general anesthetic) during surgery. °DIAGNOSIS °Your caregiver may ask for tests to be done if the problems do not improve after a few days. Tests may also be done if symptoms are severe or if the reason for the nausea   and vomiting is not clear. Tests may include: °· Urine tests. °· Blood tests. °· Stool tests. °· Cultures (to look for evidence of infection). °· X-rays or other imaging studies. °Test results can help your caregiver make decisions about  treatment or the need for additional tests. °TREATMENT °You need to stay well hydrated. Drink frequently but in small amounts. You may wish to drink water, sports drinks, clear broth, or eat frozen ice pops or gelatin dessert to help stay hydrated. When you eat, eating slowly may help prevent nausea. There are also some antinausea medicines that may help prevent nausea. °HOME CARE INSTRUCTIONS  °· Take all medicine as directed by your caregiver. °· If you do not have an appetite, do not force yourself to eat. However, you must continue to drink fluids. °· If you have an appetite, eat a normal diet unless your caregiver tells you differently. °¨ Eat a variety of complex carbohydrates (rice, wheat, potatoes, bread), lean meats, yogurt, fruits, and vegetables. °¨ Avoid high-fat foods because they are more difficult to digest. °· Drink enough water and fluids to keep your urine clear or pale yellow. °· If you are dehydrated, ask your caregiver for specific rehydration instructions. Signs of dehydration may include: °¨ Severe thirst. °¨ Dry lips and mouth. °¨ Dizziness. °¨ Dark urine. °¨ Decreasing urine frequency and amount. °¨ Confusion. °¨ Rapid breathing or pulse. °SEEK IMMEDIATE MEDICAL CARE IF:  °· You have blood or brown flecks (like coffee grounds) in your vomit. °· You have black or bloody stools. °· You have a severe headache or stiff neck. °· You are confused. °· You have severe abdominal pain. °· You have chest pain or trouble breathing. °· You do not urinate at least once every 8 hours. °· You develop cold or clammy skin. °· You continue to vomit for longer than 24 to 48 hours. °· You have a fever. °MAKE SURE YOU:  °· Understand these instructions. °· Will watch your condition. °· Will get help right away if you are not doing well or get worse. °Document Released: 04/26/2005 Document Revised: 07/19/2011 Document Reviewed: 09/23/2010 °Exit[employer]® Patient Information ©2015 [employer]. This information is not  intended to replace advice given to you by your health care provider. Make sure you discuss any questions you have with your health care provider. ° °

## 2014-10-09 NOTE — Telephone Encounter (Signed)
Reclast given on 10/03/14.  Next injection will be after 10/04/15.

## 2014-10-10 DIAGNOSIS — Z45321 Encounter for adjustment and management of cochlear device: Secondary | ICD-10-CM | POA: Diagnosis not present

## 2014-10-10 DIAGNOSIS — I519 Heart disease, unspecified: Secondary | ICD-10-CM | POA: Diagnosis not present

## 2014-10-10 DIAGNOSIS — Z7982 Long term (current) use of aspirin: Secondary | ICD-10-CM | POA: Diagnosis not present

## 2014-10-10 DIAGNOSIS — G4730 Sleep apnea, unspecified: Secondary | ICD-10-CM | POA: Diagnosis not present

## 2014-10-10 DIAGNOSIS — F329 Major depressive disorder, single episode, unspecified: Secondary | ICD-10-CM | POA: Diagnosis not present

## 2014-10-10 DIAGNOSIS — J189 Pneumonia, unspecified organism: Secondary | ICD-10-CM | POA: Diagnosis not present

## 2014-10-10 DIAGNOSIS — H903 Sensorineural hearing loss, bilateral: Secondary | ICD-10-CM | POA: Diagnosis not present

## 2014-10-10 DIAGNOSIS — R112 Nausea with vomiting, unspecified: Secondary | ICD-10-CM | POA: Diagnosis not present

## 2014-10-13 DIAGNOSIS — R350 Frequency of micturition: Secondary | ICD-10-CM | POA: Diagnosis not present

## 2014-10-13 DIAGNOSIS — M25551 Pain in right hip: Secondary | ICD-10-CM | POA: Diagnosis not present

## 2014-10-13 DIAGNOSIS — N390 Urinary tract infection, site not specified: Secondary | ICD-10-CM | POA: Diagnosis not present

## 2014-10-17 ENCOUNTER — Telehealth (HOSPITAL_COMMUNITY): Payer: Self-pay

## 2014-10-17 NOTE — Telephone Encounter (Signed)
Telephone message left for patient after she left one earlier this morning stating she was having "bad side effects" to her recently started Cymbalta.  Patient stated she has been sweating and shaking along with increased problems with vision and increased heart rate at times.  Left message for patient this nurse would contact Dr. Casimiro Needle to follow up but hold medication until follow up.  Patient last seen by Dr. Casimiro Needle on 09/18/14 where she was to continue Wellbutrin XL 450mg  daily and Pristiq 50mg .  Patient could not afford Pristiq and was changed to Cymbalt 60mg  by Dr. Casimiro Needle on 09/25/14.  Patient ended up in the ED on 10/06/14 with GI upset, nausea and diarrhea where she was treated and released.  Patient called back and stated the sweating began approximately 3 days ago, shaking yesterday and worsening vision and HR.  Discussed if patient was having any chest pain or concerns about increased HR or other symptoms to seek emergency evaluation through her PCP, urgent care or ED.  Agreed to contact Dr. Casimiro Needle and to call patient back this morning. Contacted Dr.Plovsky to inform of patient's current presenting symptoms and recent trip to ED on 10/06/14 for GI upset.  Instructed to have patient D/C Cymbalta and to come in to see him at 9:00am on 10/18/14 as suspect patient with  Serotonin Syndrome.  Called patient back to inform to stop her Cymbalta and to set her up to see Dr. Casimiro Needle on 10/18/14 at 9:00am.  Patient agreed with plan and again informed her to go to Urgent Care, ED or PCP if any worsening of symptoms today, any SOB, chest pain, etc.  Patient agreed with plan and states will see Dr. Casimiro Needle on tomorrow. 10/18/14 at 9:00am for follow up.  Agrees will not take any more Cymbalta as instructed.

## 2014-10-18 ENCOUNTER — Ambulatory Visit (INDEPENDENT_AMBULATORY_CARE_PROVIDER_SITE_OTHER): Payer: Medicare Other | Admitting: Psychiatry

## 2014-10-18 VITALS — BP 130/80 | HR 91 | Ht 63.25 in | Wt 149.4 lb

## 2014-10-18 DIAGNOSIS — F331 Major depressive disorder, recurrent, moderate: Secondary | ICD-10-CM | POA: Diagnosis not present

## 2014-10-18 MED ORDER — DESIPRAMINE HCL 25 MG PO TABS: ORAL_TABLET | ORAL | Status: DC

## 2014-10-18 NOTE — Progress Notes (Signed)
[facility] MD Progress Note  10/18/2014 9:40 AM [name]  MRN:  [id number] Subjective: Better This patient was last seen on May 11 and it just gotten back on her Wellbutrin. She clearly was better on the Wellbutrin but she just did not feel well enough. She still felt depressed. At that time because she been on Pristiq in the past the decision was made to add Pristiq to the Wellbutrin. Unfortunately a number of unexpected events occurred. First her Pristiq was not covered by her insurance and was going to cost her over $300. Unfortunately I was out of town when the call came in from the pharmacy and the decision was made to switch her to Cymbalta which makes good sense. Is not clear if I was the one who made that judgment are someone else but it was reasonable. Unfortunately the patient forgot that she been on Cymbalta many years ago and had what sounds like a serotonin response to it. Nonetheless she started the Cymbalta and a few days later began sweating trembling and feeling very weak. She contacted our office and in a few days was asked to discontinue the Cymbalta. Today she seen after being off of it for 24 hours and she feels significantly improved. She's no longer having tachycardia she's no longer sweating she feels better in terms of anxiety. She still acknowledges that she's depressed and even feels more so. At this time the patient denies being suicidal. She's not drinking any alcohol at all. Things are further complicated by the fact that she's getting new shots in her head for headaches. Discopathy to by the fact that she got new glasses so everything seems blurred to her. Principal Problem: Major depression recurrent episode moderate Major depression recurrent episode moderateDiagnosis: major depression recurrent episode moderate   Patient Active Problem List   Diagnosis Date Noted  . Major depressive disorder, recurrent episode, moderate [F33.1] 09/18/2014  . Non-traumatic compression  fracture of vertebral column with routine healing [M48.50XD] 07/17/2014  . Osteoporosis [M81.0] 07/17/2014  . H/O vitamin D deficiency [Z86.39] 07/17/2014  . Odynophagia [R13.10] 07/09/2014  . Dysphagia [R13.10] 07/08/2014  . Chest pain [R07.9] 05/20/2014  . Nausea & vomiting [R11.2] 05/20/2014  . 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 Clinical depression [F32.9] 03/29/2014  . Breath shortness [R06.02] 03/29/2014  . Hypercholesterolemia without hypertriglyceridemia [E78.0] 03/29/2014  . Fatigue [R53.83] 03/29/2014  . Classical migraine with intractable migraine [G43.119] 03/29/2014  . Disorder of mitral valve [I05.9] 03/29/2014  . Atypical migraine [G43.809] 03/29/2014  . Atrial septal defect of fossa ovalis [Q21.1] 03/29/2014  . HLD (hyperlipidemia) [E78.5] 03/29/2014  . Arthralgia of multiple joints [M25.50] 03/29/2014  . Anemia, iron deficiency [D50.9] 03/29/2014  . Fibrositis [M79.7] 03/29/2014  . Abdominal pain [R10.9] 12/20/2013  . Absolute anemia [D64.9] 09/20/2013  . Bronchitis [J40] 09/20/2013  . Vaginitis and vulvovaginitis [N76.0] 09/20/2013  . Family history of breast cancer [Z80.3] 07/13/2013  . Palpitation [R00.2] 07/02/2013  . History of mitral valve repair [Z98.89] 07/02/2013  . Status post patch closure of ASD [Z98.89] 07/02/2013  . Hyperlipidemia [E78.5] 07/02/2013  . OAB (overactive bladder) [N32.81] 06/13/2013  . Ovarian cyst, left [N83.20] 03/15/2013  . Vaginal lesion [N89.8] 03/15/2013  . Ovarian mass [N83.9] 03/15/2013  . Maternal DVT (deep vein thrombosis), history of [Z86.718] 02/26/2013  . PMB (postmenopausal bleeding) [N95.0] 02/26/2013  . Alcohol dependence [F10.20] 11/07/2012  . Unspecified vitamin D deficiency [E55.9] 08/17/2012  . L1 vertebral fracture [S32.019A] 08/01/2012  . Spinal  compression fracture [M48.50XA] 07/12/2012  . Menopausal state [N95.1] 07/12/2012  . Psoriasis [L40.9] 07/12/2012  .  Major depressive disorder, recurrent episode, severe, without mention of psychotic behavior [F33.2] 12/07/2011  . Generalized anxiety disorder [F41.1] 12/07/2011  . Cardiomyopathy [425] 03/16/2011  . Hearing impairment [H91.90]   . Migraine [346]   . Arthritis [M19.90]    Total Time spent with patient: 30 minutes   Past Medical History:  Past Medical History  Diagnosis Date  . Depression   . Migraine   . Fibromyalgia   . Hearing impairment   . Arthritis   . Cardiomyopathy   . Pulmonary embolism   . Gastritis   . Osteoporosis   . Anemia   . Allergy     SEASONAL  . Anxiety   . GERD (gastroesophageal reflux disease)   . Hypertension     [name], take htn medication to regulate heart beat.  . Sleep apnea   . Vitamin D deficiency     Past Surgical History  Procedure Laterality Date  . Appendectomy    . Inner ear surgery      TUBES  . Tonsillectomy and adenoidectomy    . Tubal ligation    . Laparoscopic cholecystectomy  2012  . Cardiac surgery  January 2007    mitral valve repair  . Cardiac surgery  1973    atrial septal defect  . Cochlear implant     Family History:  Family History  Problem Relation Age of Onset  . Breast cancer Mother     bilateral; ages 66 and 92; TAH/BSO ~50  . Depression Sister   . Heart disease Father   . Hypertension Father   . Colon cancer Neg Hx   . Esophageal cancer Neg Hx   . Pancreatic cancer Neg Hx   . Rectal cancer Neg Hx   . Stomach cancer Neg Hx    Social History:  History  Alcohol Use No     History  Drug Use No    History   Social History  . Marital Status: Divorced    Spouse Name: N/A  . Number of Children: N/A  . Years of Education: N/A   Social History Main Topics  . Smoking status: Never Smoker   . Smokeless tobacco: Never Used  . Alcohol Use: No  . Drug Use: No  . Sexual Activity: Yes    Birth Control/ Protection: Surgical     Comment: 1st intercourse- 19, partners- 69   Other Topics Concern  . Not on  file   Social History Narrative   Additional History:    Sleep: Fair  Appetite:  Fair   Assessment:   Musculoskeletal: Strength & Muscle Tone:  Gait & Station:  Patient leans:    Psychiatric Specialty Exam: Physical Exam  ROS  Blood pressure 130/80, pulse 91, height 5' 3.25" (1.607 m), weight 149 lb 6.4 oz (67.767 kg).Body mass index is 26.24 kg/(m^2).  General Appearance: Casual  Eye Contact::  Good  Speech:  Clear and Coherent  Volume:  Normal  Mood:  NA  Affect:  Appropriate  Thought Process:  Coherent  Orientation:  Full (Time, Place, and Person)  Thought Content:  WDL  Suicidal Thoughts:  No  Homicidal Thoughts:  No  Memory:  NA  Judgement:  Good  Insight:  Good  Psychomotor Activity:  Normal  Concentration:  Good  Recall:  Good  Fund of Knowledge:Good  Language: Good  Akathisia:  No  Handed:  Right  AIMS (  if indicated):     Assets:  Desire for Improvement  ADL's:  Intact  Cognition: WNL  Sleep:        Current Medications: Current Outpatient Prescriptions  Medication Sig Dispense Refill  . acetaminophen (TYLENOL) 500 MG tablet Take 1,000 mg by mouth every 6 (six) hours as needed for moderate pain or headache.    [name] aspirin EC 81 MG tablet Take 81 mg by mouth every morning.    [name] buPROPion (WELLBUTRIN XL) 150 MG 24 hr tablet Take 3 tablets (450 mg total) by mouth every morning. 90 tablet 3  . carvedilol (COREG) 25 MG tablet TAKE 1 TABLET BY MOUTH TWICE A DAY WITH MEALS 60 tablet 3  . desipramine (NORPRAMIN) 25 MG tablet 1 qam 30 tablet 5  . dicyclomine (BENTYL) 20 MG tablet Take 1 tablet (20 mg total) by mouth 2 (two) times daily. (Patient not taking: Reported on 09/08/2014) 20 tablet 0  . DULoxetine (CYMBALTA) 60 MG capsule Take 1 capsule (60 mg total) by mouth daily. 30 capsule 2  . fluconazole (DIFLUCAN) 100 MG tablet Take 1 tablet (100 mg total) by mouth daily. (Patient not taking: Reported on 09/04/2014) 20 tablet 3  . fluticasone (FLONASE) 50  MCG/ACT nasal spray Place 1 spray into both nostrils daily as needed for allergies or rhinitis.    [name] gabapentin (NEURONTIN) 100 MG capsule Take 1 capsule by mouth 2 (two) times daily as needed (headache.).   0  . gabapentin (NEURONTIN) 300 MG capsule Take 600 mg by mouth at bedtime.  1  . hydrochlorothiazide (MICROZIDE) 12.5 MG capsule Take 1 capsule (12.5 mg total) by mouth every morning. 90 capsule 2  . HYDROcodone-acetaminophen (NORCO/VICODIN) 5-325 MG per tablet Take 1-2 tablets by mouth every 4 (four) hours as needed. (Patient not taking: Reported on 10/06/2014) 16 tablet 0  . loratadine (CLARITIN) 10 MG tablet Take 10 mg by mouth daily as needed for allergies.     . mirabegron ER (MYRBETRIQ) 50 MG TB24 tablet Take 1 tablet (50 mg total) by mouth daily. 30 tablet 11  . Omega-3 Fatty Acids (FISH OIL) 1000 MG CAPS Take 1 capsule by mouth daily.    . ondansetron (ZOFRAN) 4 MG tablet Take 1 tablet (4 mg total) by mouth every 8 (eight) hours as needed for nausea or vomiting. (Patient not taking: Reported on 10/06/2014) 10 tablet 0  . pantoprazole (PROTONIX) 40 MG tablet Take 1 tablet (40 mg total) by mouth daily. 90 tablet 3  . sucralfate (CARAFATE) 1 G tablet Take 1 tablet (1 g total) by mouth 4 (four) times daily -  with meals and at bedtime. (Patient not taking: Reported on 10/06/2014) 28 tablet 0  . Vitamin D, Ergocalciferol, (DRISDOL) 50000 UNITS CAPS capsule Take 1 capsule (50,000 Units total) by mouth every 7 (seven) days. 12 capsule 0   No current facility-administered medications for this visit.    Lab Results: No results found for this or any previous visit (from the past 48 hour(s)).  Physical Findings: AIMS:  , ,  ,  ,    CIWA:    COWS:     Treatment Plan Summary: At this time the patient will stay off Cymbalta and unfortunately cannot afford Pristiq so that will not be prescribed. The patient will continue taking Wellbutrin 450 mg. The patient will awake another 48 hours giving 3  days to being off Cymbalta which I suspect will result in a person with none of the serotonin-like symptoms of Cymbalta.  In essence her sweating is better her heart rate is now normal she feels much less anxious and much less tense. This is the same reaction she says she had to Cymbalta many years ago. The patient says she still feels depressed and I will go ahead and responded that by asking her in 3 days to begin low-dose desipramine in the morning. This agent is much less serotonergic and more norepinephrine to contribute to her Wellbutrin. The patient return to see me in 5 weeks.   Medical Decision Making:  New problem, with additional work up planned     Haskel Schroeder 10/18/2014, 9:40 AM

## 2014-10-22 DIAGNOSIS — Z23 Encounter for immunization: Secondary | ICD-10-CM | POA: Diagnosis not present

## 2014-10-22 DIAGNOSIS — M169 Osteoarthritis of hip, unspecified: Secondary | ICD-10-CM | POA: Diagnosis not present

## 2014-10-23 DIAGNOSIS — M542 Cervicalgia: Secondary | ICD-10-CM | POA: Diagnosis not present

## 2014-10-23 DIAGNOSIS — G43111 Migraine with aura, intractable, with status migrainosus: Secondary | ICD-10-CM | POA: Diagnosis not present

## 2014-10-23 DIAGNOSIS — G43719 Chronic migraine without aura, intractable, without status migrainosus: Secondary | ICD-10-CM | POA: Diagnosis not present

## 2014-10-23 DIAGNOSIS — R51 Headache: Secondary | ICD-10-CM | POA: Diagnosis not present

## 2014-10-23 DIAGNOSIS — G518 Other disorders of facial nerve: Secondary | ICD-10-CM | POA: Diagnosis not present

## 2014-10-23 DIAGNOSIS — M791 Myalgia: Secondary | ICD-10-CM | POA: Diagnosis not present

## 2014-10-27 ENCOUNTER — Emergency Department (HOSPITAL_COMMUNITY)
Admission: EM | Admit: 2014-10-27 | Discharge: 2014-10-28 | Disposition: A | Discharge status: Home / Self Care | Payer: Medicare Other | Attending: Emergency Medicine | Admitting: Emergency Medicine

## 2014-10-27 ENCOUNTER — Emergency Department (HOSPITAL_COMMUNITY): Payer: Medicare Other

## 2014-10-27 ENCOUNTER — Encounter (HOSPITAL_COMMUNITY): Payer: Self-pay | Admitting: Emergency Medicine

## 2014-10-27 DIAGNOSIS — Z862 Personal history of diseases of the blood and blood-forming organs and certain disorders involving the immune mechanism: Secondary | ICD-10-CM | POA: Insufficient documentation

## 2014-10-27 DIAGNOSIS — J9811 Atelectasis: Secondary | ICD-10-CM | POA: Diagnosis not present

## 2014-10-27 DIAGNOSIS — Z79899 Other long term (current) drug therapy: Secondary | ICD-10-CM | POA: Insufficient documentation

## 2014-10-27 DIAGNOSIS — E559 Vitamin D deficiency, unspecified: Secondary | ICD-10-CM | POA: Insufficient documentation

## 2014-10-27 DIAGNOSIS — H9190 Unspecified hearing loss, unspecified ear: Secondary | ICD-10-CM | POA: Insufficient documentation

## 2014-10-27 DIAGNOSIS — K449 Diaphragmatic hernia without obstruction or gangrene: Secondary | ICD-10-CM | POA: Diagnosis not present

## 2014-10-27 DIAGNOSIS — R112 Nausea with vomiting, unspecified: Secondary | ICD-10-CM

## 2014-10-27 DIAGNOSIS — G43909 Migraine, unspecified, not intractable, without status migrainosus: Secondary | ICD-10-CM | POA: Insufficient documentation

## 2014-10-27 DIAGNOSIS — F419 Anxiety disorder, unspecified: Secondary | ICD-10-CM | POA: Insufficient documentation

## 2014-10-27 DIAGNOSIS — Z86711 Personal history of pulmonary embolism: Secondary | ICD-10-CM | POA: Insufficient documentation

## 2014-10-27 DIAGNOSIS — R002 Palpitations: Secondary | ICD-10-CM

## 2014-10-27 DIAGNOSIS — R1110 Vomiting, unspecified: Secondary | ICD-10-CM

## 2014-10-27 DIAGNOSIS — K219 Gastro-esophageal reflux disease without esophagitis: Secondary | ICD-10-CM | POA: Diagnosis not present

## 2014-10-27 DIAGNOSIS — I10 Essential (primary) hypertension: Secondary | ICD-10-CM | POA: Diagnosis not present

## 2014-10-27 DIAGNOSIS — Z7982 Long term (current) use of aspirin: Secondary | ICD-10-CM | POA: Insufficient documentation

## 2014-10-27 DIAGNOSIS — Z3202 Encounter for pregnancy test, result negative: Secondary | ICD-10-CM | POA: Insufficient documentation

## 2014-10-27 DIAGNOSIS — M1990 Unspecified osteoarthritis, unspecified site: Secondary | ICD-10-CM | POA: Insufficient documentation

## 2014-10-27 DIAGNOSIS — R1013 Epigastric pain: Secondary | ICD-10-CM | POA: Insufficient documentation

## 2014-10-27 DIAGNOSIS — F329 Major depressive disorder, single episode, unspecified: Secondary | ICD-10-CM | POA: Diagnosis not present

## 2014-10-27 LAB — BASIC METABOLIC PANEL
Anion gap: 14 (ref 5–15)
BUN: 21 mg/dL — AB (ref 6–20)
CHLORIDE: 103 mmol/L (ref 101–111)
CO2: 25 mmol/L (ref 22–32)
Calcium: 8.9 mg/dL (ref 8.9–10.3)
Creatinine, Ser: 0.77 mg/dL (ref 0.44–1.00)
GFR calc non Af Amer: >60 mL/min (ref >60)
GLUCOSE: 75 mg/dL (ref 65–99)
Potassium: 3.5 mmol/L (ref 3.5–5.1)
SODIUM: 142 mmol/L (ref 135–145)

## 2014-10-27 LAB — HEPATIC FUNCTION PANEL
ALK PHOS: 105 U/L (ref 38–126)
ALT: 30 U/L (ref 14–54)
AST: 31 U/L (ref 15–41)
Albumin: 4 g/dL (ref 3.5–5.0)
BILIRUBIN DIRECT: 0.1 mg/dL (ref 0.1–0.5)
BILIRUBIN TOTAL: 0.7 mg/dL (ref 0.3–1.2)
Indirect Bilirubin: 0.6 mg/dL (ref 0.3–0.9)
Total Protein: 7.1 g/dL (ref 6.5–8.1)

## 2014-10-27 LAB — I-STAT TROPONIN, ED: Troponin i, poc: 0 ng/mL (ref 0.00–0.08)

## 2014-10-27 LAB — CBC
HEMATOCRIT: 42.9 % (ref 36.0–46.0)
Hemoglobin: 14.3 g/dL (ref 12.0–15.0)
MCH: 33.6 pg (ref 26.0–34.0)
MCHC: 33.3 g/dL (ref 30.0–36.0)
MCV: 100.7 fL — ABNORMAL HIGH (ref 78.0–100.0)
PLATELETS: 251 10*3/uL (ref 150–400)
RBC: 4.26 MIL/uL (ref 3.87–5.11)
RDW: 14.8 % (ref 11.5–15.5)
WBC: 5.3 10*3/uL (ref 4.0–10.5)

## 2014-10-27 LAB — LIPASE, BLOOD: LIPASE: 18 U/L — AB (ref 22–51)

## 2014-10-27 MED ORDER — ONDANSETRON HCL 4 MG/2ML IJ SOLN
4.0000 mg | Freq: Once | INTRAMUSCULAR | Status: AC
  Administered 2014-10-27: 4 mg via INTRAVENOUS
  Filled 2014-10-27: qty 2

## 2014-10-27 MED ORDER — SODIUM CHLORIDE 0.9 % IV BOLUS (SEPSIS)
1000.0000 mL | Freq: Once | INTRAVENOUS | Status: AC
  Administered 2014-10-27: 1000 mL via INTRAVENOUS

## 2014-10-27 MED ORDER — GI COCKTAIL ~~LOC~~
30.0000 mL | Freq: Once | ORAL | Status: AC
Start: 2014-10-27 — End: 2014-10-27
  Administered 2014-10-27: 30 mL via ORAL
  Filled 2014-10-27: qty 30

## 2014-10-27 MED ORDER — HYDROMORPHONE HCL 1 MG/ML IJ SOLN
1.0000 mg | Freq: Once | INTRAMUSCULAR | Status: AC
Start: 2014-10-27 — End: 2014-10-27
  Administered 2014-10-27: 1 mg via INTRAVENOUS
  Filled 2014-10-27: qty 1

## 2014-10-27 NOTE — ED Notes (Signed)
Pt from home c/o palpitations, and epigastric pain, nausea and vomiting starting around 1500 today.  HX of cardiac sugery fro "leaky valve' and "hole in heart". She take carvediol for heart rate regulation. Pt is hearing is hearing impaired.

## 2014-10-27 NOTE — ED Provider Notes (Signed)
CSN: [id number]     Arrival date & time 10/27/14  2131 History   First MD Initiated Contact with Patient 10/27/14 2148     Chief Complaint  Patient presents with  . Palpitations  . Emesis     (Consider location/radiation/quality/duration/timing/severity/associated sxs/prior Treatment) HPI Juno Alers is a 54 year old female past medical history of GERD, depression, fibromyalgia, cardiomyopathy, PE, hypertension who presents the ER c/o upper abdominal pain. Patient states her symptoms began acutely at 3:00 this afternoon, with associated nausea, several episodes of nonbilious, nonbloody emesis. Patient states this afternoon her pain has been constant, and persistent. Patient reports worsening of her pain with lying flat, mild alleviation when sitting upright. Patient reports having what she describes as palpitations, feels her heart beating in her throat. Patient denies any associated chest pressure or pain, denies shortness of breath, fever, headache, blurred vision, dizziness, weakness, diarrhea, dysuria, constipation. Patient states these signs and symptoms are consistent with identical to previous episodes of GI upset she has experienced in the past. Patient states she sees Dr. Ardis Hughs with gastroenterology for same. Patient states she takes Protonix on a regular basis, missed her dose today.  Past Medical History  Diagnosis Date  . Depression   . Migraine   . Fibromyalgia   . Hearing impairment   . Arthritis   . Cardiomyopathy   . Pulmonary embolism   . Gastritis   . Osteoporosis   . Anemia   . Allergy     SEASONAL  . Anxiety   . GERD (gastroesophageal reflux disease)   . Hypertension     [name], take htn medication to regulate heart beat.  . Sleep apnea   . Vitamin D deficiency    Past Surgical History  Procedure Laterality Date  . Appendectomy    . Inner ear surgery      TUBES  . Tonsillectomy and adenoidectomy    . Tubal ligation    . Laparoscopic cholecystectomy  2012   . Cardiac surgery  January 2007    mitral valve repair  . Cardiac surgery  1973    atrial septal defect  . Cochlear implant     Family History  Problem Relation Age of Onset  . Breast cancer Mother     bilateral; ages 73 and 20; TAH/BSO ~50  . Depression Sister   . Heart disease Father   . Hypertension Father   . Colon cancer Neg Hx   . Esophageal cancer Neg Hx   . Pancreatic cancer Neg Hx   . Rectal cancer Neg Hx   . Stomach cancer Neg Hx    History  Substance Use Topics  . Smoking status: Never Smoker   . Smokeless tobacco: Never Used  . Alcohol Use: No   OB History    Gravida Para Term Preterm AB TAB SAB Ectopic Multiple Living   2 2 2       2      Review of Systems  Constitutional: Negative for fever.  HENT: Negative for trouble swallowing.   Eyes: Negative for visual disturbance.  Respiratory: Negative for shortness of breath.   Cardiovascular: Negative for chest pain.  Gastrointestinal: Negative for nausea, vomiting and abdominal pain.  Genitourinary: Negative for dysuria.  Musculoskeletal: Negative for neck pain.  Skin: Negative for rash.  Neurological: Negative for dizziness, weakness and numbness.  Psychiatric/Behavioral: Negative.     Allergies  Depakote and Valium  Home Medications   Prior to Admission medications   Medication Sig Start Date End Date  Taking? Authorizing Provider  aspirin EC 81 MG tablet Take 81 mg by mouth every morning.   Yes Historical Provider, MD  buPROPion (WELLBUTRIN XL) 150 MG 24 hr tablet Take 3 tablets (450 mg total) by mouth every morning. 09/18/14  Yes Norma Fredrickson, MD  carvedilol (COREG) 25 MG tablet TAKE 1 TABLET BY MOUTH TWICE A DAY WITH MEALS 05/06/14  Yes Jerline Pain, MD  diphenhydrAMINE (BENADRYL) 25 MG tablet Take 25 mg by mouth daily.   Yes Historical Provider, MD  fluticasone (FLONASE) 50 MCG/ACT nasal spray Place 1 spray into both nostrils daily as needed for allergies or rhinitis.   Yes Historical Provider,  MD  gabapentin (NEURONTIN) 100 MG capsule Take 1 capsule by mouth 2 (two) times daily as needed (headache.).  09/10/14  Yes Historical Provider, MD  gabapentin (NEURONTIN) 300 MG capsule Take 600 mg by mouth at bedtime. 09/10/14  Yes Historical Provider, MD  hydrochlorothiazide (MICROZIDE) 12.5 MG capsule Take 1 capsule (12.5 mg total) by mouth every morning. 05/16/14  Yes Jerline Pain, MD  mirabegron ER (MYRBETRIQ) 50 MG TB24 tablet Take 1 tablet (50 mg total) by mouth daily. 07/17/14  Yes Terrance Mass, MD  Omega-3 Fatty Acids (FISH OIL) 1000 MG CAPS Take 1 capsule by mouth 2 (two) times daily.    Yes Historical Provider, MD  pantoprazole (PROTONIX) 40 MG tablet Take 1 tablet (40 mg total) by mouth daily. 09/08/14  Yes Dorie Rank, MD  desipramine (NORPRAMIN) 25 MG tablet 1 qam Patient not taking: Reported on 10/27/2014 10/18/14   Norma Fredrickson, MD  dicyclomine (BENTYL) 20 MG tablet Take 1 tablet (20 mg total) by mouth 2 (two) times daily. 10/28/14   Dahlia Bailiff, PA-C  fluconazole (DIFLUCAN) 100 MG tablet Take 1 tablet (100 mg total) by mouth daily. Patient not taking: Reported on 09/04/2014 08/21/14   Milus Banister, MD  HYDROcodone-acetaminophen (NORCO/VICODIN) 5-325 MG per tablet Take 1-2 tablets by mouth every 4 (four) hours as needed. 10/28/14   Charlann Lange, PA-C  ondansetron (ZOFRAN) 4 MG tablet Take 1 tablet (4 mg total) by mouth every 8 (eight) hours as needed for nausea or vomiting. 10/28/14   Dahlia Bailiff, PA-C  sucralfate (CARAFATE) 1 G tablet Take 1 tablet (1 g total) by mouth 4 (four) times daily -  with meals and at bedtime. Patient not taking: Reported on 10/06/2014 09/08/14   Dorie Rank, MD  Vitamin D, Ergocalciferol, (DRISDOL) 50000 UNITS CAPS capsule Take 1 capsule (50,000 Units total) by mouth every 7 (seven) days. Patient not taking: Reported on 10/27/2014 07/18/14   Terrance Mass, MD   BP 148/89 mmHg  Pulse 104  Temp(Src) 97.8 F (36.6 C) (Oral)  Resp 18  Wt 152 lb (68.947 kg)  SpO2  96% Physical Exam  Constitutional: She is oriented to person, place, and time. She appears well-developed and well-nourished. No distress.  HENT:  Head: Normocephalic and atraumatic.  Mouth/Throat: Oropharynx is clear and moist. No oropharyngeal exudate.  Eyes: Right eye exhibits no discharge. Left eye exhibits no discharge. No scleral icterus.  Neck: Normal range of motion.  Cardiovascular: Normal rate, regular rhythm and normal heart sounds.   No murmur heard. Pulmonary/Chest: Effort normal and breath sounds normal. No respiratory distress.  Abdominal: Soft. Normal appearance. There is tenderness in the epigastric area. There is no rigidity, no rebound, no guarding, no tenderness at McBurney's point and negative Murphy's sign.  Musculoskeletal: Normal range of motion. She exhibits no edema or tenderness.  Neurological: She is alert and oriented to person, place, and time. No cranial nerve deficit. Coordination normal.  Skin: Skin is warm and dry. No rash noted. She is not diaphoretic.  Psychiatric: She has a normal mood and affect.  Nursing note and vitals reviewed.   ED Course  Procedures (including critical care time) Labs Review Labs Reviewed  BASIC METABOLIC PANEL - Abnormal; Notable for the following:    BUN 21 (*)    All other components within normal limits  CBC - Abnormal; Notable for the following:    MCV 100.7 (*)    All other components within normal limits  URINALYSIS, ROUTINE W REFLEX MICROSCOPIC (NOT AT [facility]) - Abnormal; Notable for the following:    APPearance CLOUDY (*)    Hgb urine dipstick SMALL (*)    Ketones, ur 15 (*)    All other components within normal limits  LIPASE, BLOOD - Abnormal; Notable for the following:    Lipase 18 (*)    All other components within normal limits  URINE MICROSCOPIC-ADD ON - Abnormal; Notable for the following:    Squamous Epithelial / LPF FEW (*)    All other components within normal limits  PREGNANCY, URINE  HEPATIC  FUNCTION PANEL  I-STAT TROPOININ, ED    Imaging Review Dg Chest 2 View  10/27/2014   CLINICAL DATA:  Cardiac palpitations for 1 day  EXAM: CHEST  2 VIEW  COMPARISON:  Sep 08, 2014 and May 19, 2014  FINDINGS: Lungs are clear. Heart size and pulmonary vascularity are normal. Patient is status post mitral valve replacement. No adenopathy. There is anterior wedging of the L1 vertebral body, stable.  IMPRESSION: No edema or consolidation.  No change in cardiac silhouette.   Electronically Signed   By: Lowella Grip III M.D.   On: 10/27/2014 22:00   Ct Angio Chest Pe W/cm &/or Wo Cm  10/28/2014   CLINICAL DATA:  Acute onset of palpitations, epigastric abdominal pain, nausea and vomiting. Initial encounter.  EXAM: CT ANGIOGRAPHY CHEST WITH CONTRAST  TECHNIQUE: Multidetector CT imaging of the chest was performed using the standard protocol during bolus administration of intravenous contrast. Multiplanar CT image reconstructions and MIPs were obtained to evaluate the vascular anatomy.  CONTRAST:  138mL OMNIPAQUE IOHEXOL 350 MG/ML SOLN  COMPARISON:  Chest radiograph performed 10/27/2014  FINDINGS: There is no evidence of pulmonary embolus.  Mild patchy bilateral atelectasis is noted. There is no evidence of pleural effusion or pneumothorax. No masses are identified; no abnormal focal contrast enhancement is seen.  There is marked distention of the proximal esophagus, filled with fluid and air, and mild diffuse wall thickening along the esophagus. A small to moderate hiatal hernia is seen. This may reflect prior esophageal dilatation; would correlate with the patient's history.  The patient is status post median sternotomy. A mitral valve replacement is noted. No pericardial effusion is identified. No mediastinal lymphadenopathy is seen. The thyroid gland is diminutive and grossly unremarkable. No axillary lymphadenopathy is seen.  The visualized portions of the liver and spleen are unremarkable.  No acute  osseous abnormalities are seen. There appears to be a chronic compression deformity of vertebral body T5.  Review of the MIP images confirms the above findings.  IMPRESSION: 1. No evidence of pulmonary embolus. 2. Mild patchy bilateral atelectasis seen; lungs otherwise clear. 3. Marked distention of the proximal esophagus, filled with fluid and air, and mild diffuse wall thickening along the esophagus. Small to moderate hiatal hernia seen. This may  reflect prior esophageal dilatation. Would correlate with the patient's history. 4. Chronic compression deformity of vertebral body T5.   Electronically Signed   By: Garald Balding M.D.   On: 10/28/2014 03:30     EKG Interpretation   Date/Time:  Sunday October 27 2014 21:37:15 EDT Ventricular Rate:  102 PR Interval:  176 QRS Duration: 79 QT Interval:  333 QTC Calculation: 434 R Axis:   62 Text Interpretation:  Sinus tachycardia Borderline repolarization  abnormality No significant change since last tracing Confirmed by ALLEN   MD, ANTHONY ([id number]) on 10/27/2014 10:23:51 PM      MDM   Final diagnoses:  Epigastric pain  Non-intractable vomiting with nausea, vomiting of unspecified type    Patient here with epigastric pain consistent with previous gastric reflux versus gastritis is diagnosed by patient's gastroenterologist. Patient denies chest pain or shortness of breath. Patient's signs and symptoms are atypical for any anginal equivalent. EKG without evidence of injury or ectopy. Troponin negative after 6 hours from onset of symptoms. No concern for ACS. Chest x-ray unremarkable for acute pathology. Based on history of similar flareups with identical signs and symptoms there is alternative diagnosis for PE, however on re-examination pt is awake, sitting upright in the bed in the room with Sp02 at 87% with good waveform.  Pt mildly tachycardic at 105.  Given hx of PE, will f/u with CT angio of chest.    Patient signed out to Charlann Lange, PA-C with  plan to f/u on CT scan and assess Sp02 with ambulation. D/c home if maintaining normal SpO2.    Signed,  Dahlia Bailiff, PA-C 3:54 PM       Dahlia Bailiff, PA-C 10/28/14 1554

## 2014-10-27 NOTE — ED Provider Notes (Signed)
Medical screening examination/treatment/procedure(s) were conducted as a shared visit with non-physician practitioner(s) and myself.  I personally evaluated the patient during the encounter.   EKG Interpretation   Date/Time:  Sunday October 27 2014 21:37:15 EDT Ventricular Rate:  102 PR Interval:  176 QRS Duration: 79 QT Interval:  333 QTC Calculation: 434 R Axis:   62 Text Interpretation:  Sinus tachycardia Borderline repolarization  abnormality No significant change since last tracing Confirmed by Jalexus Brett   MD, Rawan Riendeau ([id number]) on 10/27/2014 10:23:51 PM     Patient here complaining of upper abdominal pain similar to her prior GERD which radiates to her chest. Denies any anginal type symptoms at this time. EKG and labs reviewed and are reassuring. No signs of pancreatitis. Chest x-ray is negative. Will treat symptomatically and patient to follow-up with her Dr.  Lacretia Leigh, MD 10/27/14 [phone]

## 2014-10-28 ENCOUNTER — Encounter (HOSPITAL_COMMUNITY): Payer: Self-pay

## 2014-10-28 ENCOUNTER — Emergency Department (HOSPITAL_COMMUNITY): Payer: Medicare Other

## 2014-10-28 DIAGNOSIS — K449 Diaphragmatic hernia without obstruction or gangrene: Secondary | ICD-10-CM | POA: Diagnosis not present

## 2014-10-28 DIAGNOSIS — J9811 Atelectasis: Secondary | ICD-10-CM | POA: Diagnosis not present

## 2014-10-28 DIAGNOSIS — M25551 Pain in right hip: Secondary | ICD-10-CM | POA: Diagnosis not present

## 2014-10-28 DIAGNOSIS — R1013 Epigastric pain: Secondary | ICD-10-CM | POA: Diagnosis not present

## 2014-10-28 LAB — URINALYSIS, ROUTINE W REFLEX MICROSCOPIC
BILIRUBIN URINE: NEGATIVE
Glucose, UA: NEGATIVE mg/dL
Ketones, ur: 15 mg/dL — AB
Leukocytes, UA: NEGATIVE
NITRITE: NEGATIVE
Protein, ur: NEGATIVE mg/dL
Specific Gravity, Urine: 1.026 (ref 1.005–1.030)
UROBILINOGEN UA: 1 mg/dL (ref 0.0–1.0)
pH: 6 (ref 5.0–8.0)

## 2014-10-28 LAB — PREGNANCY, URINE: Preg Test, Ur: NEGATIVE

## 2014-10-28 LAB — URINE MICROSCOPIC-ADD ON

## 2014-10-28 MED ORDER — IOHEXOL 350 MG/ML SOLN
100.0000 mL | Freq: Once | INTRAVENOUS | Status: AC | PRN
  Administered 2014-10-28: 100 mL via INTRAVENOUS

## 2014-10-28 MED ORDER — HYDROCODONE-ACETAMINOPHEN 5-325 MG PO TABS: 1.0000 | ORAL_TABLET | ORAL | Status: DC | PRN

## 2014-10-28 MED ORDER — FENTANYL CITRATE (PF) 100 MCG/2ML IJ SOLN
50.0000 ug | Freq: Once | INTRAMUSCULAR | Status: AC
Start: 2014-10-28 — End: 2014-10-28
  Administered 2014-10-28: 50 ug via INTRAVENOUS
  Filled 2014-10-28: qty 2

## 2014-10-28 MED ORDER — HYDROMORPHONE HCL 1 MG/ML IJ SOLN: 1.0000 mg | Freq: Once | INTRAMUSCULAR | Status: DC

## 2014-10-28 MED ORDER — FENTANYL CITRATE (PF) 100 MCG/2ML IJ SOLN
50.0000 ug | Freq: Once | INTRAMUSCULAR | Status: AC
  Administered 2014-10-28: 50 ug via INTRAVENOUS
  Filled 2014-10-28: qty 2

## 2014-10-28 MED ORDER — ONDANSETRON HCL 4 MG PO TABS: 4.0000 mg | ORAL_TABLET | Freq: Three times a day (TID) | ORAL | Status: DC | PRN

## 2014-10-28 MED ORDER — PANTOPRAZOLE SODIUM 40 MG PO TBEC
40.0000 mg | DELAYED_RELEASE_TABLET | Freq: Once | ORAL | Status: AC
  Administered 2014-10-28: 40 mg via ORAL
  Filled 2014-10-28: qty 1

## 2014-10-28 MED ORDER — DICYCLOMINE HCL 20 MG PO TABS: 20.0000 mg | ORAL_TABLET | Freq: Two times a day (BID) | ORAL | Status: DC

## 2014-10-28 MED ORDER — OXYCODONE-ACETAMINOPHEN 5-325 MG PO TABS
2.0000 | ORAL_TABLET | Freq: Once | ORAL | Status: AC
  Administered 2014-10-28: 2 via ORAL
  Filled 2014-10-28: qty 2

## 2014-10-28 MED ORDER — DICYCLOMINE HCL 10 MG/ML IM SOLN: 10.0000 mg | Freq: Once | INTRAMUSCULAR | Status: DC

## 2014-10-28 MED ORDER — ONDANSETRON HCL 4 MG/2ML IJ SOLN
4.0000 mg | Freq: Once | INTRAMUSCULAR | Status: AC
  Administered 2014-10-28: 4 mg via INTRAVENOUS
  Filled 2014-10-28: qty 2

## 2014-10-28 MED ORDER — PROMETHAZINE HCL 25 MG/ML IJ SOLN
12.5000 mg | Freq: Once | INTRAMUSCULAR | Status: AC
  Administered 2014-10-28: 12.5 mg via INTRAVENOUS
  Filled 2014-10-28: qty 1

## 2014-10-28 NOTE — Discharge Instructions (Signed)
Gastroesophageal Reflux Disease, Adult Gastroesophageal reflux disease (GERD) happens when acid from your stomach flows up into the esophagus. When acid comes in contact with the esophagus, the acid causes soreness (inflammation) in the esophagus. Over time, GERD may create small holes (ulcers) in the lining of the esophagus. CAUSES   Increased body weight. This puts pressure on the stomach, making acid rise from the stomach into the esophagus.  Smoking. This increases acid production in the stomach.  Drinking alcohol. This causes decreased pressure in the lower esophageal sphincter (valve or ring of muscle between the esophagus and stomach), allowing acid from the stomach into the esophagus.  Late evening meals and a full stomach. This increases pressure and acid production in the stomach.  A malformed lower esophageal sphincter. Sometimes, no cause is found. SYMPTOMS   Burning pain in the lower part of the mid-chest behind the breastbone and in the mid-stomach area. This may occur twice a week or more often.  Trouble swallowing.  Sore throat.  Dry cough.  Asthma-like symptoms including chest tightness, shortness of breath, or wheezing. DIAGNOSIS  Your caregiver may be able to diagnose GERD based on your symptoms. In some cases, X-rays and other tests may be done to check for complications or to check the condition of your stomach and esophagus. TREATMENT  Your caregiver may recommend over-the-counter or prescription medicines to help decrease acid production. Ask your caregiver before starting or adding any new medicines.  HOME CARE INSTRUCTIONS   Change the factors that you can control. Ask your caregiver for guidance concerning weight loss, quitting smoking, and alcohol consumption.  Avoid foods and drinks that make your symptoms worse, such as:  Caffeine or alcoholic drinks.  Chocolate.  Peppermint or mint flavorings.  Garlic and onions.  Spicy foods.  Citrus fruits,  such as oranges, lemons, or limes.  Tomato-based foods such as sauce, chili, salsa, and pizza.  Fried and fatty foods.  Avoid lying down for the 3 hours prior to your bedtime or prior to taking a nap.  Eat small, frequent meals instead of large meals.  Wear loose-fitting clothing. Do not wear anything tight around your waist that causes pressure on your stomach.  Raise the head of your bed 6 to 8 inches with wood blocks to help you sleep. Extra pillows will not help.  Only take over-the-counter or prescription medicines for pain, discomfort, or fever as directed by your caregiver.  Do not take aspirin, ibuprofen, or other nonsteroidal anti-inflammatory drugs (NSAIDs). SEEK IMMEDIATE MEDICAL CARE IF:   You have pain in your arms, neck, jaw, teeth, or back.  Your pain increases or changes in intensity or duration.  You develop nausea, vomiting, or sweating (diaphoresis).  You develop shortness of breath, or you faint.  Your vomit is green, yellow, black, or looks like coffee grounds or blood.  Your stool is red, bloody, or black. These symptoms could be signs of other problems, such as heart disease, gastric bleeding, or esophageal bleeding. MAKE SURE YOU:   Understand these instructions.  Will watch your condition.  Will get help right away if you are not doing well or get worse. Document Released: 02/03/2005 Document Revised: 07/19/2011 Document Reviewed: 11/13/2010 [facility] Patient Information 2015 [place], [place]. This information is not intended to replace advice given to you by your health care provider. Make sure you discuss any questions you have with your health care provider.   Food Choices for Gastroesophageal Reflux Disease When you have gastroesophageal reflux disease (GERD), the  foods you eat and your eating habits are very important. Choosing the right foods can help ease the discomfort of GERD. WHAT GENERAL GUIDELINES DO I NEED TO FOLLOW?  Choose fruits,  vegetables, whole grains, low-fat dairy products, and low-fat meat, fish, and poultry.  Limit fats such as oils, salad dressings, butter, nuts, and avocado.  Keep a food diary to identify foods that cause symptoms.  Avoid foods that cause reflux. These may be different for different people.  Eat frequent small meals instead of three large meals each day.  Eat your meals slowly, in a relaxed setting.  Limit fried foods.  Cook foods using methods other than frying.  Avoid drinking alcohol.  Avoid drinking large amounts of liquids with your meals.  Avoid bending over or lying down until 2-3 hours after eating. WHAT FOODS ARE NOT RECOMMENDED? The following are some foods and drinks that may worsen your symptoms: Vegetables Tomatoes. Tomato juice. Tomato and spaghetti sauce. Chili peppers. Onion and garlic. Horseradish. Fruits Oranges, grapefruit, and lemon (fruit and juice). Meats High-fat meats, fish, and poultry. This includes hot dogs, ribs, ham, sausage, salami, and bacon. Dairy Whole milk and chocolate milk. Sour cream. Cream. Butter. Ice cream. Cream cheese.  Beverages Coffee and tea, with or without caffeine. Carbonated beverages or energy drinks. Condiments Hot sauce. Barbecue sauce.  Sweets/Desserts Chocolate and cocoa. Donuts. Peppermint and spearmint. Fats and Oils High-fat foods, including [place] fries and potato chips. Other Vinegar. Strong spices, such as black pepper, white pepper, red pepper, cayenne, curry powder, cloves, ginger, and chili powder. The items listed above may not be a complete list of foods and beverages to avoid. Contact your dietitian for more information. Document Released: 04/26/2005 Document Revised: 05/01/2013 Document Reviewed: 02/28/2013 [facility] Patient Information 2015 [place], [place]. This information is not intended to replace advice given to you by your health care provider. Make sure you discuss any questions you have with your  health care provider.  Abdominal Pain Many things can cause abdominal pain. Usually, abdominal pain is not caused by a disease and will improve without treatment. It can often be observed and treated at home. Your health care provider will do a physical exam and possibly order blood tests and X-rays to help determine the seriousness of your pain. However, in many cases, more time must pass before a clear cause of the pain can be found. Before that point, your health care provider may not know if you need more testing or further treatment. HOME CARE INSTRUCTIONS  Monitor your abdominal pain for any changes. The following actions may help to alleviate any discomfort you are experiencing:  Only take over-the-counter or prescription medicines as directed by your health care provider.  Do not take laxatives unless directed to do so by your health care provider.  Try a clear liquid diet (broth, tea, or water) as directed by your health care provider. Slowly move to a bland diet as tolerated. SEEK MEDICAL CARE IF:  You have unexplained abdominal pain.  You have abdominal pain associated with nausea or diarrhea.  You have pain when you urinate or have a bowel movement.  You experience abdominal pain that wakes you in the night.  You have abdominal pain that is worsened or improved by eating food.  You have abdominal pain that is worsened with eating fatty foods.  You have a fever. SEEK IMMEDIATE MEDICAL CARE IF:   Your pain does not go away within 2 hours.  You keep throwing up (vomiting).  Your pain is felt only in portions of the abdomen, such as the right side or the left lower portion of the abdomen.  You pass bloody or black tarry stools. MAKE SURE YOU:  Understand these instructions.   Will watch your condition.   Will get help right away if you are not doing well or get worse.  Document Released: 02/03/2005 Document Revised: 05/01/2013 Document Reviewed:  01/03/2013 [facility] Patient Information 2015 [place], [place]. This information is not intended to replace advice given to you by your health care provider. Make sure you discuss any questions you have with your health care provider.

## 2014-10-28 NOTE — ED Notes (Signed)
Pt able to ambulate well in the hall--- O2 sat remained 95% on room air while ambulating.

## 2014-10-28 NOTE — ED Provider Notes (Signed)
Patient care assumed from [facility], [place], pending CT angio, which is negative.  Reassessment of the patient finds her in NAD, slightly tachycardic at 108, 95% RA O2 sat which she maintains while ambulatory. Feel she can be discharged home with comfort medications and GI follow up with her doctor Ardis Hughs). Patient made aware of discharge plans and is comfortable with discharge home. Return precautions discussed.   Charlann Lange, PA-C 10/28/14 [place], [place] 10/28/14 [phone]

## 2014-10-29 DIAGNOSIS — F410 Panic disorder [episodic paroxysmal anxiety] without agoraphobia: Secondary | ICD-10-CM | POA: Diagnosis not present

## 2014-10-29 DIAGNOSIS — F411 Generalized anxiety disorder: Secondary | ICD-10-CM | POA: Diagnosis not present

## 2014-11-18 DIAGNOSIS — F411 Generalized anxiety disorder: Secondary | ICD-10-CM | POA: Diagnosis not present

## 2014-11-18 DIAGNOSIS — F410 Panic disorder [episodic paroxysmal anxiety] without agoraphobia: Secondary | ICD-10-CM | POA: Diagnosis not present

## 2014-11-22 ENCOUNTER — Ambulatory Visit (HOSPITAL_COMMUNITY): Payer: Self-pay | Admitting: Psychiatry

## 2014-11-26 DIAGNOSIS — M25551 Pain in right hip: Secondary | ICD-10-CM | POA: Diagnosis not present

## 2014-11-27 ENCOUNTER — Emergency Department (HOSPITAL_COMMUNITY)
Admission: EM | Admit: 2014-11-27 | Discharge: 2014-11-27 | Disposition: A | Discharge status: Home / Self Care | Payer: Medicare Other | Attending: Emergency Medicine | Admitting: Emergency Medicine

## 2014-11-27 ENCOUNTER — Encounter (HOSPITAL_COMMUNITY): Payer: Self-pay | Admitting: *Deleted

## 2014-11-27 ENCOUNTER — Emergency Department (HOSPITAL_COMMUNITY): Payer: Medicare Other

## 2014-11-27 DIAGNOSIS — M810 Age-related osteoporosis without current pathological fracture: Secondary | ICD-10-CM | POA: Insufficient documentation

## 2014-11-27 DIAGNOSIS — E559 Vitamin D deficiency, unspecified: Secondary | ICD-10-CM | POA: Diagnosis not present

## 2014-11-27 DIAGNOSIS — W1839XA Other fall on same level, initial encounter: Secondary | ICD-10-CM | POA: Diagnosis not present

## 2014-11-27 DIAGNOSIS — Y9301 Activity, walking, marching and hiking: Secondary | ICD-10-CM | POA: Diagnosis not present

## 2014-11-27 DIAGNOSIS — S2241XA Multiple fractures of ribs, right side, initial encounter for closed fracture: Secondary | ICD-10-CM | POA: Insufficient documentation

## 2014-11-27 DIAGNOSIS — K2970 Gastritis, unspecified, without bleeding: Secondary | ICD-10-CM | POA: Diagnosis not present

## 2014-11-27 DIAGNOSIS — Y998 Other external cause status: Secondary | ICD-10-CM | POA: Insufficient documentation

## 2014-11-27 DIAGNOSIS — Z7951 Long term (current) use of inhaled steroids: Secondary | ICD-10-CM | POA: Diagnosis not present

## 2014-11-27 DIAGNOSIS — M1990 Unspecified osteoarthritis, unspecified site: Secondary | ICD-10-CM | POA: Diagnosis not present

## 2014-11-27 DIAGNOSIS — H9190 Unspecified hearing loss, unspecified ear: Secondary | ICD-10-CM | POA: Diagnosis not present

## 2014-11-27 DIAGNOSIS — Z86711 Personal history of pulmonary embolism: Secondary | ICD-10-CM | POA: Insufficient documentation

## 2014-11-27 DIAGNOSIS — Z9089 Acquired absence of other organs: Secondary | ICD-10-CM | POA: Diagnosis not present

## 2014-11-27 DIAGNOSIS — S299XXA Unspecified injury of thorax, initial encounter: Secondary | ICD-10-CM | POA: Diagnosis present

## 2014-11-27 DIAGNOSIS — F329 Major depressive disorder, single episode, unspecified: Secondary | ICD-10-CM | POA: Diagnosis not present

## 2014-11-27 DIAGNOSIS — M797 Fibromyalgia: Secondary | ICD-10-CM | POA: Diagnosis not present

## 2014-11-27 DIAGNOSIS — G43909 Migraine, unspecified, not intractable, without status migrainosus: Secondary | ICD-10-CM | POA: Insufficient documentation

## 2014-11-27 DIAGNOSIS — I10 Essential (primary) hypertension: Secondary | ICD-10-CM | POA: Insufficient documentation

## 2014-11-27 DIAGNOSIS — K219 Gastro-esophageal reflux disease without esophagitis: Secondary | ICD-10-CM | POA: Diagnosis not present

## 2014-11-27 DIAGNOSIS — Z862 Personal history of diseases of the blood and blood-forming organs and certain disorders involving the immune mechanism: Secondary | ICD-10-CM | POA: Insufficient documentation

## 2014-11-27 DIAGNOSIS — Z7982 Long term (current) use of aspirin: Secondary | ICD-10-CM | POA: Insufficient documentation

## 2014-11-27 DIAGNOSIS — S2231XA Fracture of one rib, right side, initial encounter for closed fracture: Secondary | ICD-10-CM | POA: Diagnosis not present

## 2014-11-27 DIAGNOSIS — Z79899 Other long term (current) drug therapy: Secondary | ICD-10-CM | POA: Diagnosis not present

## 2014-11-27 DIAGNOSIS — F419 Anxiety disorder, unspecified: Secondary | ICD-10-CM | POA: Insufficient documentation

## 2014-11-27 DIAGNOSIS — Y92002 Bathroom of unspecified non-institutional (private) residence single-family (private) house as the place of occurrence of the external cause: Secondary | ICD-10-CM | POA: Diagnosis not present

## 2014-11-27 MED ORDER — ONDANSETRON HCL 4 MG/2ML IJ SOLN
4.0000 mg | Freq: Once | INTRAMUSCULAR | Status: AC
  Administered 2014-11-27: 4 mg via INTRAVENOUS
  Filled 2014-11-27: qty 2

## 2014-11-27 MED ORDER — HYDROMORPHONE HCL 1 MG/ML IJ SOLN
1.0000 mg | Freq: Once | INTRAMUSCULAR | Status: AC
  Administered 2014-11-27: 1 mg via INTRAVENOUS
  Filled 2014-11-27: qty 1

## 2014-11-27 MED ORDER — SUCRALFATE 1 G PO TABS
1.0000 g | ORAL_TABLET | Freq: Once | ORAL | Status: AC
  Administered 2014-11-27: 1 g via ORAL
  Filled 2014-11-27: qty 1

## 2014-11-27 MED ORDER — OXYCODONE-ACETAMINOPHEN 5-325 MG PO TABS: 1.0000 | ORAL_TABLET | Freq: Four times a day (QID) | ORAL | Status: DC | PRN

## 2014-11-27 MED ORDER — METOCLOPRAMIDE HCL 5 MG/ML IJ SOLN: 10.0000 mg | Freq: Once | INTRAMUSCULAR | Status: DC

## 2014-11-27 MED ORDER — PANTOPRAZOLE SODIUM 40 MG IV SOLR
40.0000 mg | Freq: Once | INTRAVENOUS | Status: AC
  Administered 2014-11-27: 40 mg via INTRAVENOUS
  Filled 2014-11-27: qty 40

## 2014-11-27 MED ORDER — OXYCODONE-ACETAMINOPHEN 5-325 MG PO TABS
2.0000 | ORAL_TABLET | Freq: Once | ORAL | Status: AC
  Administered 2014-11-27: 2 via ORAL
  Filled 2014-11-27: qty 2

## 2014-11-27 MED ORDER — ONDANSETRON HCL 4 MG PO TABS: 4.0000 mg | ORAL_TABLET | Freq: Four times a day (QID) | ORAL | Status: DC | PRN

## 2014-11-27 NOTE — Discharge Instructions (Signed)
Rib Fracture °A rib fracture is a break or crack in one of the bones of the ribs. The ribs are a group of long, curved bones that wrap around your chest and attach to your spine. They protect your lungs and other organs in the chest cavity. A broken or cracked rib is often painful, but most do not cause other problems. Most rib fractures heal on their own over time. However, rib fractures can be more serious if multiple ribs are broken or if broken ribs move out of place and push against other structures. °CAUSES  °· A direct blow to the chest. For example, this could happen during contact sports, a car accident, or a fall against a hard object. °· Repetitive movements with high force, such as pitching a baseball or having severe coughing spells. °SYMPTOMS  °· Pain when you breathe in or cough. °· Pain when someone presses on the injured area. °DIAGNOSIS  °Your caregiver will perform a physical exam. Various imaging tests may be ordered to confirm the diagnosis and to look for related injuries. These tests may include a chest X-ray, computed tomography (CT), magnetic resonance imaging (MRI), or a bone scan. °TREATMENT  °Rib fractures usually heal on their own in 1-3 months. The longer healing period is often associated with a continued cough or other aggravating activities. During the healing period, pain control is very important. Medication is usually given to control pain. Hospitalization or surgery may be needed for more severe injuries, such as those in which multiple ribs are broken or the ribs have moved out of place.  °HOME CARE INSTRUCTIONS  °· Avoid strenuous activity and any activities or movements that cause pain. Be careful during activities and avoid bumping the injured rib. °· Gradually increase activity as directed by your caregiver. °· Only take over-the-counter or prescription medications as directed by your caregiver. Do not take other medications without asking your caregiver first. °· Apply ice  to the injured area for the first 1-2 days after you have been treated or as directed by your caregiver. Applying ice helps to reduce inflammation and pain. °¨ Put ice in a plastic bag. °¨ Place a towel between your skin and the bag.   °¨ Leave the ice on for 15-20 minutes at a time, every 2 hours while you are awake. °· Perform deep breathing as directed by your caregiver. This will help prevent pneumonia, which is a common complication of a broken rib. Your caregiver may instruct you to: °¨ Take deep breaths several times a day. °¨ Try to cough several times a day, holding a pillow against the injured area. °¨ Use a device called an incentive spirometer to practice deep breathing several times a day. °· Drink enough fluids to keep your urine clear or pale yellow. This will help you avoid constipation.   °· Do not wear a rib belt or binder. These restrict breathing, which can lead to pneumonia.   °SEEK IMMEDIATE MEDICAL CARE IF:  °· You have a fever.   °· You have difficulty breathing or shortness of breath.   °· You develop a continual cough, or you cough up thick or bloody sputum. °· You feel sick to your stomach (nausea), throw up (vomit), or have abdominal pain.   °· You have worsening pain not controlled with medications.   °MAKE SURE YOU: °· Understand these instructions. °· Will watch your condition. °· Will get help right away if you are not doing well or get worse. °Document Released: 04/26/2005 Document Revised: 12/27/2012 Document Reviewed:   06/28/2012 [employer] Patient Information 2015 [place], [place]. This information is not intended to replace advice given to you by your health care provider. Make sure you discuss any questions you have with your health care provider.   Gastritis, Adult Gastritis is soreness and swelling (inflammation) of the lining of the stomach. Gastritis can develop as a sudden onset (acute) or long-term (chronic) condition. If gastritis is not treated, it can lead to stomach  bleeding and ulcers. CAUSES  Gastritis occurs when the stomach lining is weak or damaged. Digestive juices from the stomach then inflame the weakened stomach lining. The stomach lining may be weak or damaged due to viral or bacterial infections. One common bacterial infection is the Helicobacter pylori infection. Gastritis can also result from excessive alcohol consumption, taking certain medicines, or having too much acid in the stomach.  SYMPTOMS  In some cases, there are no symptoms. When symptoms are present, they may include:  Pain or a burning sensation in the upper abdomen.  Nausea.  Vomiting.  An uncomfortable feeling of fullness after eating. DIAGNOSIS  Your caregiver may suspect you have gastritis based on your symptoms and a physical exam. To determine the cause of your gastritis, your caregiver may perform the following:  Blood or stool tests to check for the H pylori bacterium.  Gastroscopy. A thin, flexible tube (endoscope) is passed down the esophagus and into the stomach. The endoscope has a light and camera on the end. Your caregiver uses the endoscope to view the inside of the stomach.  Taking a tissue sample (biopsy) from the stomach to examine under a microscope. TREATMENT  Depending on the cause of your gastritis, medicines may be prescribed. If you have a bacterial infection, such as an H pylori infection, antibiotics may be given. If your gastritis is caused by too much acid in the stomach, H2 blockers or antacids may be given. Your caregiver may recommend that you stop taking aspirin, ibuprofen, or other nonsteroidal anti-inflammatory drugs (NSAIDs). HOME CARE INSTRUCTIONS  Only take over-the-counter or prescription medicines as directed by your caregiver.  If you were given antibiotic medicines, take them as directed. Finish them even if you start to feel better.  Drink enough fluids to keep your urine clear or pale yellow.  Avoid foods and drinks that make  your symptoms worse, such as:  Caffeine or alcoholic drinks.  Chocolate.  Peppermint or mint flavorings.  Garlic and onions.  Spicy foods.  Citrus fruits, such as oranges, lemons, or limes.  Tomato-based foods such as sauce, chili, salsa, and pizza.  Fried and fatty foods.  Eat small, frequent meals instead of large meals. SEEK IMMEDIATE MEDICAL CARE IF:   You have black or dark red stools.  You vomit blood or material that looks like coffee grounds.  You are unable to keep fluids down.  Your abdominal pain gets worse.  You have a fever.  You do not feel better after 1 week.  You have any other questions or concerns. MAKE SURE YOU:  Understand these instructions.  Will watch your condition.  Will get help right away if you are not doing well or get worse. Document Released: 04/20/2001 Document Revised: 10/26/2011 Document Reviewed: 06/09/2011 [facility] Patient Information 2015 [place], [place]. This information is not intended to replace advice given to you by your health care provider. Make sure you discuss any questions you have with your health care provider.

## 2014-11-27 NOTE — ED Notes (Signed)
Pt escorted to discharge window. Pt verbalized understanding discharge instructions. In no acute distress.  

## 2014-11-27 NOTE — ED Notes (Signed)
Pt alert and oriented x4. Respirations even and unlabored, bilateral symmetrical rise and fall of chest. Skin warm and dry. In no acute distress. Denies needs.   

## 2014-11-27 NOTE — ED Provider Notes (Signed)
CSN: [id number]     Arrival date & time 11/27/14  5027 History   First MD Initiated Contact with Patient 11/27/14 [phone]     Chief Complaint  Patient presents with  . Fall/Rib Pain   . Emesis     (Consider location/radiation/quality/duration/timing/severity/associated sxs/prior Treatment) Patient is a 54 y.o. female presenting with fall. The history is provided by the patient. No language interpreter was used.  Fall This is a new problem. The current episode started 3 to 5 hours ago. The problem occurs constantly. The problem has not changed since onset.Associated symptoms include chest pain. Pertinent negatives include no abdominal pain, no headaches and no shortness of breath. Nothing aggravates the symptoms. Nothing relieves the symptoms. She has tried nothing for the symptoms. The treatment provided no relief.    Past Medical History  Diagnosis Date  . Depression   . Migraine   . Fibromyalgia   . Hearing impairment   . Arthritis   . Cardiomyopathy   . Pulmonary embolism   . Gastritis   . Osteoporosis   . Anemia   . Allergy     SEASONAL  . Anxiety   . GERD (gastroesophageal reflux disease)   . Hypertension     [name], take htn medication to regulate heart beat.  . Sleep apnea   . Vitamin D deficiency    Past Surgical History  Procedure Laterality Date  . Appendectomy    . Inner ear surgery      TUBES  . Tonsillectomy and adenoidectomy    . Tubal ligation    . Laparoscopic cholecystectomy  2012  . Cardiac surgery  January 2007    mitral valve repair  . Cardiac surgery  1973    atrial septal defect  . Cochlear implant     Family History  Problem Relation Age of Onset  . Breast cancer Mother     bilateral; ages 56 and 26; TAH/BSO ~50  . Depression Sister   . Heart disease Father   . Hypertension Father   . Colon cancer Neg Hx   . Esophageal cancer Neg Hx   . Pancreatic cancer Neg Hx   . Rectal cancer Neg Hx   . Stomach cancer Neg Hx    History  Substance  Use Topics  . Smoking status: Never Smoker   . Smokeless tobacco: Never Used  . Alcohol Use: No   OB History    Gravida Para Term Preterm AB TAB SAB Ectopic Multiple Living   2 2 2       2      Review of Systems  Constitutional: Negative for fever, chills, diaphoresis, activity change, appetite change and fatigue.  HENT: Negative for congestion, facial swelling, rhinorrhea and sore throat.   Eyes: Negative for photophobia and discharge.  Respiratory: Negative for cough, chest tightness and shortness of breath.   Cardiovascular: Positive for chest pain. Negative for palpitations and leg swelling.  Gastrointestinal: Negative for nausea, vomiting, abdominal pain and diarrhea.  Endocrine: Negative for polydipsia and polyuria.  Genitourinary: Negative for dysuria, frequency, difficulty urinating and pelvic pain.  Musculoskeletal: Negative for back pain, arthralgias, neck pain and neck stiffness.  Skin: Negative for color change and wound.  Allergic/Immunologic: Negative for immunocompromised state.  Neurological: Negative for facial asymmetry, weakness, numbness and headaches.  Hematological: Does not bruise/bleed easily.  Psychiatric/Behavioral: Negative for confusion and agitation.      Allergies  Depakote and Valium  Home Medications   Prior to Admission medications   Medication  Sig Start Date End Date Taking? Authorizing Provider  aspirin EC 81 MG tablet Take 81 mg by mouth every morning.   Yes Historical Provider, MD  buPROPion (WELLBUTRIN XL) 150 MG 24 hr tablet Take 3 tablets (450 mg total) by mouth every morning. 09/18/14  Yes Norma Fredrickson, MD  carvedilol (COREG) 25 MG tablet TAKE 1 TABLET BY MOUTH TWICE A DAY WITH MEALS 05/06/14  Yes Jerline Pain, MD  DULoxetine (CYMBALTA) 60 MG capsule Take 60 mg by mouth daily. 09/25/14  Yes Historical Provider, MD  fluticasone (FLONASE) 50 MCG/ACT nasal spray Place 1 spray into both nostrils daily as needed for allergies or rhinitis.    Yes Historical Provider, MD  gabapentin (NEURONTIN) 100 MG capsule Take 100 mg by mouth daily.   Yes Historical Provider, MD  gabapentin (NEURONTIN) 300 MG capsule Take 600 mg by mouth at bedtime.   Yes Historical Provider, MD  hydrochlorothiazide (MICROZIDE) 12.5 MG capsule Take 1 capsule (12.5 mg total) by mouth every morning. 05/16/14  Yes Jerline Pain, MD  mirabegron ER (MYRBETRIQ) 50 MG TB24 tablet Take 1 tablet (50 mg total) by mouth daily. 07/17/14  Yes Terrance Mass, MD  Omega-3 Fatty Acids (FISH OIL) 1000 MG CAPS Take 1 capsule by mouth 2 (two) times daily.    Yes Historical Provider, MD  pantoprazole (PROTONIX) 40 MG tablet Take 1 tablet (40 mg total) by mouth daily. 09/08/14  Yes Dorie Rank, MD  desipramine (NORPRAMIN) 25 MG tablet 1 qam Patient not taking: Reported on 10/27/2014 10/18/14   Norma Fredrickson, MD  dicyclomine (BENTYL) 20 MG tablet Take 1 tablet (20 mg total) by mouth 2 (two) times daily. Patient not taking: Reported on 11/27/2014 10/28/14   Dahlia Bailiff, PA-C  fluconazole (DIFLUCAN) 100 MG tablet Take 1 tablet (100 mg total) by mouth daily. Patient not taking: Reported on 09/04/2014 08/21/14   Milus Banister, MD  HYDROcodone-acetaminophen (NORCO/VICODIN) 5-325 MG per tablet Take 1-2 tablets by mouth every 4 (four) hours as needed. Patient not taking: Reported on 11/27/2014 10/28/14   Charlann Lange, PA-C  ondansetron (ZOFRAN) 4 MG tablet Take 1 tablet (4 mg total) by mouth every 6 (six) hours as needed for nausea or vomiting. 11/27/14   Ernestina Patches, MD  oxyCODONE-acetaminophen (PERCOCET) 5-325 MG per tablet Take 1 tablet by mouth every 6 (six) hours as needed. 11/27/14   Ernestina Patches, MD  sucralfate (CARAFATE) 1 G tablet Take 1 tablet (1 g total) by mouth 4 (four) times daily -  with meals and at bedtime. Patient not taking: Reported on 10/06/2014 09/08/14   Dorie Rank, MD  Vitamin D, Ergocalciferol, (DRISDOL) 50000 UNITS CAPS capsule Take 1 capsule (50,000 Units total) by mouth every 7  (seven) days. Patient not taking: Reported on 10/27/2014 07/18/14   Terrance Mass, MD   BP 114/84 mmHg  Pulse 80  Temp(Src) 98.2 F (36.8 C) (Oral)  Resp 16  Ht 5' 3.25" (1.607 m)  Wt 152 lb (68.947 kg)  BMI 26.70 kg/m2  SpO2 98% Physical Exam  Constitutional: She is oriented to person, place, and time. She appears well-developed and well-nourished. No distress.  HENT:  Head: Normocephalic and atraumatic.  Mouth/Throat: No oropharyngeal exudate.  Eyes: Pupils are equal, round, and reactive to light.  Neck: Normal range of motion. Neck supple.  Cardiovascular: Normal rate, regular rhythm and normal heart sounds.  Exam reveals no gallop and no friction rub.   No murmur heard. Pulmonary/Chest: Effort normal and breath sounds  normal. No respiratory distress. She has no wheezes. She has no rales.    Abdominal: Soft. Bowel sounds are normal. She exhibits no distension and no mass. There is no tenderness. There is no rebound and no guarding.  Musculoskeletal: Normal range of motion. She exhibits no edema or tenderness.  Neurological: She is alert and oriented to person, place, and time.  Skin: Skin is warm and dry.  Psychiatric: She has a normal mood and affect.    ED Course  Procedures (including critical care time) Labs Review Labs Reviewed - No data to display  Imaging Review Dg Ribs Unilateral W/chest Right  11/27/2014   CLINICAL DATA:  Right rib pain post fall this morning  EXAM: RIGHT RIBS AND CHEST - 3+ VIEW  COMPARISON:  10/28/2014  FINDINGS: Three views right ribs submitted. Status post median sternotomy. No acute infiltrate or pulmonary edema. There is nondisplaced fracture of the right eighth rib anteriorly. Mild displaced fracture of the right seventh rib. No pneumothorax.  IMPRESSION: No acute infiltrate or pulmonary edema. There is nondisplaced fracture of the right eighth rib anteriorly. Mild displaced fracture of the right seventh rib. No pneumothorax.   Electronically  Signed   By: Lahoma Crocker M.D.   On: 11/27/2014 09:31     EKG Interpretation None      MDM   Final diagnoses:  Gastritis  Right rib fracture, closed, initial encounter    Pt is a 54 y.o. female with Pmhx as above who presents with right lateral chest wall pain and bruising after she fell this morning while walking to the bathroom around 4 AM she states she went back to bed, but then woke up about an hour later with epigastric pain, nausea and vomiting.  The gastric pain has been chronic, recurring for approximately one year.  It is similar to prior episodes.  She is a large area of ecchymosis over her right lateral chest wall.  Lungs are clear.  She is on no anticoagulants.   Chest x-ray with nondisplaced right anterior seventh and eighth ribs.  No underlying pneumothorax.  Patient treated with 2 doses of IV narcotics with improvement of chest pain but stated that now her stomach is bothering her.  It is the same location of her chronic recurrent epigastric pain.  She has seen GI for this issue and has been seen multiple times for the same.  Percocet orders.  I need to switch her to by mouth pain medicine.  Patient complained of this never works and that it upsets her stomach and then asked for Coca-Cola to drink.  She'll be given a dose of Protonix here, which she takes at home.  Patient sat stable while ambulating.  Believe she can be discharged home with Percocet for pain control, Zofran, and incentive spirometer.  Her abdominal exam was benign and I do not feel that imaging or labs would be of benefit if she has had extensive workup in the past . Worsening pain, shortness of breath, fever.   [facility] evaluation in the Emergency Department is complete. It has been determined that no acute conditions requiring further emergency intervention are present at this time. The patient/guardian have been advised of the diagnosis and plan. We have discussed signs and symptoms that warrant return to  the ED, such as changes or worsening in symptoms,       Ernestina Patches, MD 11/27/14 1154

## 2014-11-27 NOTE — ED Notes (Signed)
Pt reports she fell around 0400 when getting up to the bathroom. Purple bruise noted to right rib area.  Pt then woke up later and vomited x2. abd pain present as well. Pain 10/10

## 2014-12-02 DIAGNOSIS — F410 Panic disorder [episodic paroxysmal anxiety] without agoraphobia: Secondary | ICD-10-CM | POA: Diagnosis not present

## 2014-12-02 DIAGNOSIS — F411 Generalized anxiety disorder: Secondary | ICD-10-CM | POA: Diagnosis not present

## 2014-12-03 ENCOUNTER — Other Ambulatory Visit: Payer: Self-pay | Admitting: Gastroenterology

## 2014-12-03 DIAGNOSIS — S2239XD Fracture of one rib, unspecified side, subsequent encounter for fracture with routine healing: Secondary | ICD-10-CM | POA: Diagnosis not present

## 2014-12-10 DIAGNOSIS — M25551 Pain in right hip: Secondary | ICD-10-CM | POA: Diagnosis not present

## 2014-12-12 DIAGNOSIS — F329 Major depressive disorder, single episode, unspecified: Secondary | ICD-10-CM | POA: Diagnosis not present

## 2014-12-12 DIAGNOSIS — H9193 Unspecified hearing loss, bilateral: Secondary | ICD-10-CM | POA: Diagnosis not present

## 2014-12-12 DIAGNOSIS — I519 Heart disease, unspecified: Secondary | ICD-10-CM | POA: Diagnosis not present

## 2014-12-12 DIAGNOSIS — G4730 Sleep apnea, unspecified: Secondary | ICD-10-CM | POA: Diagnosis not present

## 2014-12-12 DIAGNOSIS — H903 Sensorineural hearing loss, bilateral: Secondary | ICD-10-CM | POA: Diagnosis not present

## 2014-12-12 DIAGNOSIS — Z7982 Long term (current) use of aspirin: Secondary | ICD-10-CM | POA: Diagnosis not present

## 2014-12-12 DIAGNOSIS — H905 Unspecified sensorineural hearing loss: Secondary | ICD-10-CM | POA: Diagnosis not present

## 2014-12-12 DIAGNOSIS — Z45321 Encounter for adjustment and management of cochlear device: Secondary | ICD-10-CM | POA: Diagnosis not present

## 2014-12-15 ENCOUNTER — Emergency Department (HOSPITAL_BASED_OUTPATIENT_CLINIC_OR_DEPARTMENT_OTHER)
Admission: EM | Admit: 2014-12-15 | Discharge: 2014-12-16 | Disposition: A | Discharge status: Home / Self Care | Payer: Medicare Other | Attending: Emergency Medicine | Admitting: Emergency Medicine

## 2014-12-15 ENCOUNTER — Emergency Department (HOSPITAL_BASED_OUTPATIENT_CLINIC_OR_DEPARTMENT_OTHER): Payer: Medicare Other

## 2014-12-15 ENCOUNTER — Encounter (HOSPITAL_BASED_OUTPATIENT_CLINIC_OR_DEPARTMENT_OTHER): Payer: Self-pay | Admitting: Adult Health

## 2014-12-15 DIAGNOSIS — F329 Major depressive disorder, single episode, unspecified: Secondary | ICD-10-CM | POA: Insufficient documentation

## 2014-12-15 DIAGNOSIS — S0081XA Abrasion of other part of head, initial encounter: Secondary | ICD-10-CM | POA: Insufficient documentation

## 2014-12-15 DIAGNOSIS — Z862 Personal history of diseases of the blood and blood-forming organs and certain disorders involving the immune mechanism: Secondary | ICD-10-CM | POA: Diagnosis not present

## 2014-12-15 DIAGNOSIS — Z8669 Personal history of other diseases of the nervous system and sense organs: Secondary | ICD-10-CM | POA: Diagnosis not present

## 2014-12-15 DIAGNOSIS — Z79899 Other long term (current) drug therapy: Secondary | ICD-10-CM | POA: Diagnosis not present

## 2014-12-15 DIAGNOSIS — F419 Anxiety disorder, unspecified: Secondary | ICD-10-CM | POA: Insufficient documentation

## 2014-12-15 DIAGNOSIS — Z972 Presence of dental prosthetic device (complete) (partial): Secondary | ICD-10-CM | POA: Diagnosis not present

## 2014-12-15 DIAGNOSIS — R002 Palpitations: Secondary | ICD-10-CM | POA: Insufficient documentation

## 2014-12-15 DIAGNOSIS — R55 Syncope and collapse: Secondary | ICD-10-CM | POA: Diagnosis not present

## 2014-12-15 DIAGNOSIS — G43909 Migraine, unspecified, not intractable, without status migrainosus: Secondary | ICD-10-CM | POA: Insufficient documentation

## 2014-12-15 DIAGNOSIS — Y999 Unspecified external cause status: Secondary | ICD-10-CM | POA: Diagnosis not present

## 2014-12-15 DIAGNOSIS — M810 Age-related osteoporosis without current pathological fracture: Secondary | ICD-10-CM | POA: Diagnosis not present

## 2014-12-15 DIAGNOSIS — R1013 Epigastric pain: Secondary | ICD-10-CM | POA: Diagnosis not present

## 2014-12-15 DIAGNOSIS — M1990 Unspecified osteoarthritis, unspecified site: Secondary | ICD-10-CM | POA: Diagnosis not present

## 2014-12-15 DIAGNOSIS — S0990XA Unspecified injury of head, initial encounter: Secondary | ICD-10-CM | POA: Diagnosis not present

## 2014-12-15 DIAGNOSIS — E860 Dehydration: Secondary | ICD-10-CM

## 2014-12-15 DIAGNOSIS — K219 Gastro-esophageal reflux disease without esophagitis: Secondary | ICD-10-CM | POA: Diagnosis not present

## 2014-12-15 DIAGNOSIS — Z86711 Personal history of pulmonary embolism: Secondary | ICD-10-CM | POA: Diagnosis not present

## 2014-12-15 DIAGNOSIS — E559 Vitamin D deficiency, unspecified: Secondary | ICD-10-CM | POA: Diagnosis not present

## 2014-12-15 DIAGNOSIS — Y939 Activity, unspecified: Secondary | ICD-10-CM | POA: Diagnosis not present

## 2014-12-15 DIAGNOSIS — Z9621 Cochlear implant status: Secondary | ICD-10-CM | POA: Insufficient documentation

## 2014-12-15 DIAGNOSIS — Z9889 Other specified postprocedural states: Secondary | ICD-10-CM | POA: Insufficient documentation

## 2014-12-15 DIAGNOSIS — W19XXXA Unspecified fall, initial encounter: Secondary | ICD-10-CM | POA: Diagnosis not present

## 2014-12-15 DIAGNOSIS — Y929 Unspecified place or not applicable: Secondary | ICD-10-CM | POA: Insufficient documentation

## 2014-12-15 DIAGNOSIS — Z7982 Long term (current) use of aspirin: Secondary | ICD-10-CM | POA: Insufficient documentation

## 2014-12-15 DIAGNOSIS — S00511A Abrasion of lip, initial encounter: Secondary | ICD-10-CM | POA: Diagnosis not present

## 2014-12-15 DIAGNOSIS — M797 Fibromyalgia: Secondary | ICD-10-CM | POA: Insufficient documentation

## 2014-12-15 LAB — URINALYSIS, ROUTINE W REFLEX MICROSCOPIC
Glucose, UA: NEGATIVE mg/dL
Ketones, ur: 40 mg/dL — AB
Leukocytes, UA: NEGATIVE
Nitrite: NEGATIVE
Protein, ur: 30 mg/dL — AB
Specific Gravity, Urine: 1.029 (ref 1.005–1.030)
Urobilinogen, UA: 0.2 mg/dL (ref 0.0–1.0)
pH: 6 (ref 5.0–8.0)

## 2014-12-15 LAB — CBC
HCT: 44.3 % (ref 36.0–46.0)
Hemoglobin: 15.4 g/dL — ABNORMAL HIGH (ref 12.0–15.0)
MCH: 34.5 pg — ABNORMAL HIGH (ref 26.0–34.0)
MCHC: 34.8 g/dL (ref 30.0–36.0)
MCV: 99.3 fL (ref 78.0–100.0)
Platelets: 365 K/uL (ref 150–400)
RBC: 4.46 MIL/uL (ref 3.87–5.11)
RDW: 14.2 % (ref 11.5–15.5)
WBC: 5.7 K/uL (ref 4.0–10.5)

## 2014-12-15 LAB — CBG MONITORING, ED: Glucose-Capillary: 74 mg/dL (ref 65–99)

## 2014-12-15 LAB — BASIC METABOLIC PANEL
Anion gap: 18 — ABNORMAL HIGH (ref 5–15)
BUN: 21 mg/dL — AB (ref 6–20)
CALCIUM: 9.1 mg/dL (ref 8.9–10.3)
CO2: 23 mmol/L (ref 22–32)
CREATININE: 0.92 mg/dL (ref 0.44–1.00)
Chloride: 97 mmol/L — ABNORMAL LOW (ref 101–111)
GFR calc Af Amer: >60 mL/min (ref >60)
GFR calc non Af Amer: >60 mL/min (ref >60)
Glucose, Bld: 74 mg/dL (ref 65–99)
Potassium: 3.3 mmol/L — ABNORMAL LOW (ref 3.5–5.1)
Sodium: 138 mmol/L (ref 135–145)

## 2014-12-15 LAB — URINE MICROSCOPIC-ADD ON

## 2014-12-15 LAB — TROPONIN I: Troponin I: <0.03 ng/mL

## 2014-12-15 MED ORDER — SODIUM CHLORIDE 0.9 % IV BOLUS (SEPSIS)
1000.0000 mL | Freq: Once | INTRAVENOUS | Status: AC
  Administered 2014-12-15: 1000 mL via INTRAVENOUS

## 2014-12-15 MED ORDER — ACETAMINOPHEN 325 MG PO TABS
650.0000 mg | ORAL_TABLET | Freq: Once | ORAL | Status: AC
  Administered 2014-12-15: 650 mg via ORAL
  Filled 2014-12-15: qty 2

## 2014-12-15 MED ORDER — PROMETHAZINE HCL 25 MG/ML IJ SOLN
25.0000 mg | Freq: Once | INTRAMUSCULAR | Status: AC
  Administered 2014-12-15: 25 mg via INTRAVENOUS
  Filled 2014-12-15: qty 1

## 2014-12-15 NOTE — ED Provider Notes (Signed)
CSN: [id number]     Arrival date & time 12/15/14  2047 History  This chart was scribed for Davonna Belling, MD by Helane Gunther, ED Scribe. This patient was seen in room MH02/MH02 and the patient's care was started at 9:20 PM.    Chief Complaint  Patient presents with  . Loss of Consciousness   The history is provided by the patient. No language interpreter was used.   HPI Comments: [name] is a 54 y.o. female with PMHx of HTN and a PSHx of cardiac surgery who presents to the Emergency Department complaining of LOC which occurred about 1 hour ago. She states that she felt nausea and abdominal pain, then dizziness, and then passed out and fell. She reports associated heart palpitations, feeling shaky, pain in the back of the head, and HA after regaining consciousness. She has had a previous episode of syncope with taking a new medication (Cymbalta) a "long time" ago.  Past Medical History  Diagnosis Date  . Depression   . Migraine   . Fibromyalgia   . Hearing impairment   . Arthritis   . Cardiomyopathy   . Pulmonary embolism   . Gastritis   . Osteoporosis   . Anemia   . Allergy     SEASONAL  . Anxiety   . GERD (gastroesophageal reflux disease)   . Hypertension     [name], take htn medication to regulate heart beat.  . Sleep apnea   . Vitamin D deficiency    Past Surgical History  Procedure Laterality Date  . Appendectomy    . Inner ear surgery      TUBES  . Tonsillectomy and adenoidectomy    . Tubal ligation    . Laparoscopic cholecystectomy  2012  . Cardiac surgery  January 2007    mitral valve repair  . Cardiac surgery  1973    atrial septal defect  . Cochlear implant     Family History  Problem Relation Age of Onset  . Breast cancer Mother     bilateral; ages 35 and 78; TAH/BSO ~50  . Depression Sister   . Heart disease Father   . Hypertension Father   . Colon cancer Neg Hx   . Esophageal cancer Neg Hx   . Pancreatic cancer Neg Hx   . Rectal cancer Neg  Hx   . Stomach cancer Neg Hx    History  Substance Use Topics  . Smoking status: Never Smoker   . Smokeless tobacco: Never Used  . Alcohol Use: No   OB History    Gravida Para Term Preterm AB TAB SAB Ectopic Multiple Living   2 2 2       2      Review of Systems  Cardiovascular: Positive for palpitations.  Gastrointestinal: Positive for nausea and abdominal pain.  Neurological: Positive for dizziness, syncope and headaches.  All other systems reviewed and are negative.   Allergies  Depakote and Valium  Home Medications   Prior to Admission medications   Medication Sig Start Date End Date Taking? Authorizing Provider  aspirin EC 81 MG tablet Take 81 mg by mouth every morning.    Historical Provider, MD  buPROPion (WELLBUTRIN XL) 150 MG 24 hr tablet Take 3 tablets (450 mg total) by mouth every morning. 09/18/14   Norma Fredrickson, MD  carvedilol (COREG) 25 MG tablet TAKE 1 TABLET BY MOUTH TWICE A DAY WITH MEALS 05/06/14   Jerline Pain, MD  desipramine (NORPRAMIN) 25 MG tablet  1 qam Patient not taking: Reported on 10/27/2014 10/18/14   Norma Fredrickson, MD  dicyclomine (BENTYL) 20 MG tablet Take 1 tablet (20 mg total) by mouth 2 (two) times daily. Patient not taking: Reported on 11/27/2014 10/28/14   Dahlia Bailiff, PA-C  DULoxetine (CYMBALTA) 60 MG capsule Take 60 mg by mouth daily. 09/25/14   Historical Provider, MD  fluconazole (DIFLUCAN) 100 MG tablet Take 1 tablet (100 mg total) by mouth daily. Patient not taking: Reported on 09/04/2014 08/21/14   Milus Banister, MD  fluticasone [facility]) 50 MCG/ACT nasal spray Place 1 spray into both nostrils daily as needed for allergies or rhinitis.    Historical Provider, MD  gabapentin (NEURONTIN) 100 MG capsule Take 100 mg by mouth daily.    Historical Provider, MD  gabapentin (NEURONTIN) 300 MG capsule Take 600 mg by mouth at bedtime.    Historical Provider, MD  hydrochlorothiazide (MICROZIDE) 12.5 MG capsule Take 1 capsule (12.5 mg total) by mouth  every morning. 05/16/14   Jerline Pain, MD  HYDROcodone-acetaminophen (NORCO/VICODIN) 5-325 MG per tablet Take 1-2 tablets by mouth every 4 (four) hours as needed. Patient not taking: Reported on 11/27/2014 10/28/14   Charlann Lange, PA-C  mirabegron ER (MYRBETRIQ) 50 MG TB24 tablet Take 1 tablet (50 mg total) by mouth daily. 07/17/14   Terrance Mass, MD  Omega-3 Fatty Acids (FISH OIL) 1000 MG CAPS Take 1 capsule by mouth 2 (two) times daily.     Historical Provider, MD  ondansetron (ZOFRAN) 4 MG tablet Take 1 tablet (4 mg total) by mouth every 6 (six) hours as needed for nausea or vomiting. 11/27/14   Ernestina Patches, MD  oxyCODONE-acetaminophen (PERCOCET) 5-325 MG per tablet Take 1 tablet by mouth every 6 (six) hours as needed. 11/27/14   Ernestina Patches, MD  pantoprazole (PROTONIX) 40 MG tablet Take 1 tablet (40 mg total) by mouth daily. 09/08/14   Dorie Rank, MD  pantoprazole (PROTONIX) 40 MG tablet TAKE 1 TABLET (40 MG TOTAL) BY MOUTH DAILY. 12/03/14   Milus Banister, MD  promethazine (PHENERGAN) 25 MG tablet Take 1 tablet (25 mg total) by mouth every 6 (six) hours as needed for nausea. 12/16/14   Davonna Belling, MD  sucralfate (CARAFATE) 1 G tablet Take 1 tablet (1 g total) by mouth 4 (four) times daily -  with meals and at bedtime. Patient not taking: Reported on 10/06/2014 09/08/14   Dorie Rank, MD  Vitamin D, Ergocalciferol, (DRISDOL) 50000 UNITS CAPS capsule Take 1 capsule (50,000 Units total) by mouth every 7 (seven) days. Patient not taking: Reported on 10/27/2014 07/18/14   Terrance Mass, MD   BP 135/83 mmHg  Pulse 96  Temp(Src) 98.5 F (36.9 C) (Oral)  Resp 18  Ht 5\' 3"  (1.6 m)  Wt 150 lb (68.04 kg)  BMI 26.58 kg/m2  SpO2 100% Physical Exam  Constitutional: She is oriented to person, place, and time. She appears well-developed and well-nourished. No distress.  HENT:  Head: Normocephalic and atraumatic.  Mouth/Throat: Oropharynx is clear and moist.  Minimal ttp of the anterior maxillary  jaw. Minimal ttp over the nose without crepitance. Pt has a cochlear implant. She has a partial denture, which was removed. No ttp on posterior head.  Eyes: Conjunctivae and EOM are normal. Pupils are equal, round, and reactive to light.  Neck: Normal range of motion. Neck supple. No tracheal deviation present.  Cardiovascular: Normal rate.   Pulmonary/Chest: Effort normal and breath sounds normal. No respiratory distress.  Lungs clear to auscultation.  Abdominal: Soft. She exhibits no mass. There is tenderness. There is no rebound and no guarding.  Minimal epigastric ttp  Musculoskeletal: Normal range of motion. She exhibits no tenderness.  No c-spine ttp. No ttp on extremities.  Neurological: She is alert and oriented to person, place, and time.  Skin: Skin is warm and dry.  Scrape on anterior forehead. Abrasion to the left upper lip which looks old.  Psychiatric: She has a normal mood and affect. Her behavior is normal.  Nursing note and vitals reviewed.   ED Course  Procedures  DIAGNOSTIC STUDIES: Oxygen Saturation is 100% on RA, normal by my interpretation.    COORDINATION OF CARE: 9:27 PM - Discussed plans to review PMHx and wait on diagnostic studies. Pt advised of plan for treatment and pt agrees.  Labs Review Labs Reviewed  BASIC METABOLIC PANEL - Abnormal; Notable for the following:    Potassium 3.3 (*)    Chloride 97 (*)    BUN 21 (*)    Anion gap 18 (*)    All other components within normal limits  CBC - Abnormal; Notable for the following:    Hemoglobin 15.4 (*)    MCH 34.5 (*)    All other components within normal limits  URINALYSIS, ROUTINE W REFLEX MICROSCOPIC (NOT AT [facility]) - Abnormal; Notable for the following:    APPearance CLOUDY (*)    Hgb urine dipstick SMALL (*)    Bilirubin Urine SMALL (*)    Ketones, ur 40 (*)    Protein, ur 30 (*)    All other components within normal limits  URINE MICROSCOPIC-ADD ON - Abnormal; Notable for the following:     Squamous Epithelial / LPF FEW (*)    Bacteria, UA FEW (*)    All other components within normal limits  TROPONIN I  CBG MONITORING, ED    Imaging Review Ct Head Wo Contrast  12/15/2014   CLINICAL DATA:  Syncopal episode with fall  EXAM: CT HEAD WITHOUT CONTRAST  TECHNIQUE: Contiguous axial images were obtained from the base of the skull through the vertex without intravenous contrast.  COMPARISON:  October 30, 2010  FINDINGS: There is a cochlear implant on the right causing moderate susceptibility artifact. There is mild diffuse atrophy, stable. There is no intracranial mass, hemorrhage, extra-axial fluid collection, or midline shift. There is patchy small vessel disease in the centra semiovale bilaterally, stable compared to prior study. No acute infarct evident. Bony calvarium appears intact. There is postoperative change in the right mastoid region. The mastoid air cells are clear bilaterally.  IMPRESSION: Artifact from cochlear implant on the right. There is stable atrophy with supratentorial small vessel disease. No mass, hemorrhage, or acute appearing infarct noted with the limitation in the area of the cochlear implant on the right as described.   Electronically Signed   By: Lowella Grip III M.D.   On: 12/15/2014 23:12     EKG Interpretation None     ED ECG REPORT   Date: 12/15/2014  Rate: 102  Rhythm: sinus tachycardia  QRS Axis: normal  Intervals: normal  ST/T Wave abnormalities: nonspecific ST/T changes  Conduction Disutrbances:none  Narrative Interpretation:   Old EKG Reviewed: unchanged   I personally performed the services described in this documentation, which was scribed in my presence. The recorded information has been reviewed and is accurate.    MDM   Final diagnoses:  Dehydration  Fall, initial encounter    Patient with lightheadedness  after epigastric pain. Lab work shows some dehydration. Has chronic abdominal pain and nausea. Vitals overall reassuring.  IV fluids given and will give oral trial.  I personally performed the services described in this documentation, which was scribed in my presence. The recorded information has been reviewed and is accurate.    Davonna Belling, MD 12/16/14 [phone]

## 2014-12-15 NOTE — ED Notes (Addendum)
Presents post syncopal episode about one hour ago-endorse feel;ing shaky, heart palpitaions and nausea and general feeling of impending doom. HX of 2 open heart surgeries. Endorses Abdominal pain at epigastric area and into chest-"My heart feels like it shaking and fluttering". Pt fell face forward and broke glasses, hit forehead and scraped right arm-bruising to forehead.

## 2014-12-15 NOTE — ED Notes (Signed)
I took CBG and got result of 74 mg./dcltr.

## 2014-12-16 DIAGNOSIS — E860 Dehydration: Secondary | ICD-10-CM | POA: Diagnosis not present

## 2014-12-16 MED ORDER — PROMETHAZINE HCL 25 MG PO TABS: 25.0000 mg | ORAL_TABLET | Freq: Four times a day (QID) | ORAL | Status: DC | PRN

## 2014-12-16 NOTE — ED Notes (Signed)
Patient is sipping water, she asked for pain medication, I notified nurse.

## 2014-12-16 NOTE — Discharge Instructions (Signed)
Dehydration, Adult Dehydration is when you lose more fluids from the body than you take in. Vital organs like the kidneys, brain, and heart cannot function without a proper amount of fluids and salt. Any loss of fluids from the body can cause dehydration.  CAUSES   Vomiting.  Diarrhea.  Excessive sweating.  Excessive urine output.  Fever. SYMPTOMS  Mild dehydration  Thirst.  Dry lips.  Slightly dry mouth. Moderate dehydration  Very dry mouth.  Sunken eyes.  Skin does not bounce back quickly when lightly pinched and released.  Dark urine and decreased urine production.  Decreased tear production.  Headache. Severe dehydration  Very dry mouth.  Extreme thirst.  Rapid, weak pulse (more than 100 beats per minute at rest).  Cold hands and feet.  Not able to sweat in spite of heat and temperature.  Rapid breathing.  Blue lips.  Confusion and lethargy.  Difficulty being awakened.  Minimal urine production.  No tears. DIAGNOSIS  Your caregiver will diagnose dehydration based on your symptoms and your exam. Blood and urine tests will help confirm the diagnosis. The diagnostic evaluation should also identify the cause of dehydration. TREATMENT  Treatment of mild or moderate dehydration can often be done at home by increasing the amount of fluids that you drink. It is best to drink small amounts of fluid more often. Drinking too much at one time can make vomiting worse. Refer to the home care instructions below. Severe dehydration needs to be treated at the hospital where you will probably be given intravenous (IV) fluids that contain water and electrolytes. HOME CARE INSTRUCTIONS   Ask your caregiver about specific rehydration instructions.  Drink enough fluids to keep your urine clear or pale yellow.  Drink small amounts frequently if you have nausea and vomiting.  Eat as you normally do.  Avoid:  Foods or drinks high in sugar.  Carbonated  drinks.  Juice.  Extremely hot or cold fluids.  Drinks with caffeine.  Fatty, greasy foods.  Alcohol.  Tobacco.  Overeating.  Gelatin desserts.  Wash your hands well to avoid spreading bacteria and viruses.  Only take over-the-counter or prescription medicines for pain, discomfort, or fever as directed by your caregiver.  Ask your caregiver if you should continue all prescribed and over-the-counter medicines.  Keep all follow-up appointments with your caregiver. SEEK MEDICAL CARE IF:  You have abdominal pain and it increases or stays in one area (localizes).  You have a rash, stiff neck, or severe headache.  You are irritable, sleepy, or difficult to awaken.  You are weak, dizzy, or extremely thirsty. SEEK IMMEDIATE MEDICAL CARE IF:   You are unable to keep fluids down or you get worse despite treatment.  You have frequent episodes of vomiting or diarrhea.  You have blood or green matter (bile) in your vomit.  You have blood in your stool or your stool looks black and tarry.  You have not urinated in 6 to 8 hours, or you have only urinated a small amount of very dark urine.  You have a fever.  You faint. MAKE SURE YOU:   Understand these instructions.  Will watch your condition.  Will get help right away if you are not doing well or get worse. Document Released: 04/26/2005 Document Revised: 07/19/2011 Document Reviewed: 12/14/2010 Exit[employer] Patient Information 2015 [employer]. This information is not intended to replace advice given to you by your health care provider. Make sure you discuss any questions you have with your health care   provider.  

## 2014-12-22 ENCOUNTER — Emergency Department (HOSPITAL_COMMUNITY): Payer: Medicare Other

## 2014-12-22 ENCOUNTER — Emergency Department (HOSPITAL_COMMUNITY)
Admission: EM | Admit: 2014-12-22 | Discharge: 2014-12-22 | Disposition: A | Discharge status: Home / Self Care | Payer: Medicare Other | Attending: Emergency Medicine | Admitting: Emergency Medicine

## 2014-12-22 ENCOUNTER — Encounter (HOSPITAL_COMMUNITY): Payer: Self-pay

## 2014-12-22 DIAGNOSIS — Z7982 Long term (current) use of aspirin: Secondary | ICD-10-CM | POA: Diagnosis not present

## 2014-12-22 DIAGNOSIS — Z862 Personal history of diseases of the blood and blood-forming organs and certain disorders involving the immune mechanism: Secondary | ICD-10-CM | POA: Diagnosis not present

## 2014-12-22 DIAGNOSIS — R1013 Epigastric pain: Secondary | ICD-10-CM | POA: Diagnosis not present

## 2014-12-22 DIAGNOSIS — Z86711 Personal history of pulmonary embolism: Secondary | ICD-10-CM | POA: Diagnosis not present

## 2014-12-22 DIAGNOSIS — M1990 Unspecified osteoarthritis, unspecified site: Secondary | ICD-10-CM | POA: Diagnosis not present

## 2014-12-22 DIAGNOSIS — R112 Nausea with vomiting, unspecified: Secondary | ICD-10-CM | POA: Insufficient documentation

## 2014-12-22 DIAGNOSIS — F419 Anxiety disorder, unspecified: Secondary | ICD-10-CM | POA: Insufficient documentation

## 2014-12-22 DIAGNOSIS — Z9049 Acquired absence of other specified parts of digestive tract: Secondary | ICD-10-CM | POA: Diagnosis not present

## 2014-12-22 DIAGNOSIS — Z9089 Acquired absence of other organs: Secondary | ICD-10-CM | POA: Insufficient documentation

## 2014-12-22 DIAGNOSIS — K2970 Gastritis, unspecified, without bleeding: Secondary | ICD-10-CM | POA: Insufficient documentation

## 2014-12-22 DIAGNOSIS — K219 Gastro-esophageal reflux disease without esophagitis: Secondary | ICD-10-CM | POA: Insufficient documentation

## 2014-12-22 DIAGNOSIS — R079 Chest pain, unspecified: Secondary | ICD-10-CM | POA: Insufficient documentation

## 2014-12-22 DIAGNOSIS — Z9851 Tubal ligation status: Secondary | ICD-10-CM | POA: Diagnosis not present

## 2014-12-22 DIAGNOSIS — H9190 Unspecified hearing loss, unspecified ear: Secondary | ICD-10-CM | POA: Insufficient documentation

## 2014-12-22 DIAGNOSIS — F329 Major depressive disorder, single episode, unspecified: Secondary | ICD-10-CM | POA: Diagnosis not present

## 2014-12-22 DIAGNOSIS — M797 Fibromyalgia: Secondary | ICD-10-CM | POA: Diagnosis not present

## 2014-12-22 DIAGNOSIS — I10 Essential (primary) hypertension: Secondary | ICD-10-CM | POA: Diagnosis not present

## 2014-12-22 DIAGNOSIS — G43909 Migraine, unspecified, not intractable, without status migrainosus: Secondary | ICD-10-CM | POA: Diagnosis not present

## 2014-12-22 DIAGNOSIS — Z79899 Other long term (current) drug therapy: Secondary | ICD-10-CM | POA: Diagnosis not present

## 2014-12-22 DIAGNOSIS — E559 Vitamin D deficiency, unspecified: Secondary | ICD-10-CM | POA: Diagnosis not present

## 2014-12-22 DIAGNOSIS — M810 Age-related osteoporosis without current pathological fracture: Secondary | ICD-10-CM | POA: Diagnosis not present

## 2014-12-22 DIAGNOSIS — R072 Precordial pain: Secondary | ICD-10-CM | POA: Diagnosis not present

## 2014-12-22 LAB — CBC
HCT: 37.5 % (ref 36.0–46.0)
HEMOGLOBIN: 12.8 g/dL (ref 12.0–15.0)
MCH: 34.8 pg — ABNORMAL HIGH (ref 26.0–34.0)
MCHC: 34.1 g/dL (ref 30.0–36.0)
MCV: 101.9 fL — ABNORMAL HIGH (ref 78.0–100.0)
Platelets: 126 10*3/uL — ABNORMAL LOW (ref 150–400)
RBC: 3.68 MIL/uL — AB (ref 3.87–5.11)
RDW: 14.4 % (ref 11.5–15.5)
WBC: 4.7 10*3/uL (ref 4.0–10.5)

## 2014-12-22 LAB — BASIC METABOLIC PANEL
ANION GAP: 14 (ref 5–15)
BUN: 13 mg/dL (ref 6–20)
CO2: 24 mmol/L (ref 22–32)
Calcium: 8.6 mg/dL — ABNORMAL LOW (ref 8.9–10.3)
Chloride: 99 mmol/L — ABNORMAL LOW (ref 101–111)
Creatinine, Ser: 0.92 mg/dL (ref 0.44–1.00)
GFR calc non Af Amer: >60 mL/min (ref >60)
Glucose, Bld: 155 mg/dL — ABNORMAL HIGH (ref 65–99)
Potassium: 3.7 mmol/L (ref 3.5–5.1)
Sodium: 137 mmol/L (ref 135–145)

## 2014-12-22 LAB — I-STAT TROPONIN, ED: Troponin i, poc: 0 ng/mL (ref 0.00–0.08)

## 2014-12-22 LAB — LIPASE, BLOOD: Lipase: 36 U/L (ref 22–51)

## 2014-12-22 MED ORDER — METOCLOPRAMIDE HCL 5 MG/ML IJ SOLN
10.0000 mg | Freq: Once | INTRAMUSCULAR | Status: AC
  Administered 2014-12-22: 10 mg via INTRAVENOUS
  Filled 2014-12-22: qty 2

## 2014-12-22 MED ORDER — PROCHLORPERAZINE EDISYLATE 5 MG/ML IJ SOLN
10.0000 mg | Freq: Four times a day (QID) | INTRAMUSCULAR | Status: DC | PRN
  Administered 2014-12-22: 10 mg via INTRAVENOUS
  Filled 2014-12-22: qty 2

## 2014-12-22 NOTE — ED Provider Notes (Signed)
CSN: [id number]     Arrival date & time 12/22/14  1146 History   First MD Initiated Contact with Patient 12/22/14 1222     Chief Complaint  Patient presents with  . Chest Pain     (Consider location/radiation/quality/duration/timing/severity/associated sxs/prior Treatment) HPI EP is a 54 yo female with a PMH depression, mitral valve repair, migraines who presents with a complaint chest pain that began this afternoon. She reports the pain as being a midsternal pressure that feels like an elephant sitting on her chest going through back. Patient then became very sweaty, nauseous, and had three episodes of non-bloody vomiting. Patient then complains of a headache over her frontal sinuses and epigastric pain. She had ASA 324 mg at home with some relief and zofran 4mg  en route with EMS with little relief of nausea. Prior to this history, the chest pain has stopped, but she reports severe 10/10 epigastric pain and a "bad" headache. She denies vision changes, bowel changes, numbness, tingling, muscular weakness, dizziness, difficulties walking/talking.  Past Medical History  Diagnosis Date  . Depression   . Migraine   . Fibromyalgia   . Hearing impairment   . Arthritis   . Cardiomyopathy   . Pulmonary embolism   . Gastritis   . Osteoporosis   . Anemia   . Allergy     SEASONAL  . Anxiety   . GERD (gastroesophageal reflux disease)   . Hypertension     [name], take htn medication to regulate heart beat.  . Sleep apnea   . Vitamin D deficiency    Past Surgical History  Procedure Laterality Date  . Appendectomy    . Inner ear surgery      TUBES  . Tonsillectomy and adenoidectomy    . Tubal ligation    . Laparoscopic cholecystectomy  2012  . Cardiac surgery  January 2007    mitral valve repair  . Cardiac surgery  1973    atrial septal defect  . Cochlear implant     Family History  Problem Relation Age of Onset  . Breast cancer Mother     bilateral; ages 73 and 47; TAH/BSO ~50  .  Depression Sister   . Heart disease Father   . Hypertension Father   . Colon cancer Neg Hx   . Esophageal cancer Neg Hx   . Pancreatic cancer Neg Hx   . Rectal cancer Neg Hx   . Stomach cancer Neg Hx    Social History  Substance Use Topics  . Smoking status: Never Smoker   . Smokeless tobacco: Never Used  . Alcohol Use: No   OB History    Gravida Para Term Preterm AB TAB SAB Ectopic Multiple Living   2 2 2       2      Review of Systems  All other systems reviewed and are negative.     Allergies  Depakote and Valium  Home Medications   Prior to Admission medications   Medication Sig Start Date End Date Taking? Authorizing Provider  acetaminophen (TYLENOL) 500 MG tablet Take 500 mg by mouth every 6 (six) hours as needed for mild pain, moderate pain or headache.   Yes Historical Provider, MD  aspirin 325 MG tablet Take 325 mg by mouth every 6 (six) hours as needed for mild pain or moderate pain.   Yes Historical Provider, MD  carvedilol (COREG) 25 MG tablet TAKE 1 TABLET BY MOUTH TWICE A DAY WITH MEALS 05/06/14  Yes Jerline Pain,  MD  desipramine (NORPRAMIN) 25 MG tablet 1 qam Patient taking differently: Take 25 mg by mouth daily. 1 qam 10/18/14  Yes Norma Fredrickson, MD  fexofenadine (ALLEGRA) 180 MG tablet Take 180 mg by mouth daily.   Yes Historical Provider, MD  gabapentin (NEURONTIN) 100 MG capsule Take 100 mg by mouth daily as needed (headache).    Yes Historical Provider, MD  gabapentin (NEURONTIN) 300 MG capsule Take 600 mg by mouth at bedtime.   Yes Historical Provider, MD  hydrochlorothiazide (MICROZIDE) 12.5 MG capsule Take 1 capsule (12.5 mg total) by mouth every morning. 05/16/14  Yes Jerline Pain, MD  mirabegron ER (MYRBETRIQ) 50 MG TB24 tablet Take 1 tablet (50 mg total) by mouth daily. 07/17/14  Yes Terrance Mass, MD  Omega-3 Fatty Acids (FISH OIL) 1000 MG CAPS Take 1 capsule by mouth 2 (two) times daily.    Yes Historical Provider, MD  ondansetron (ZOFRAN) 4 MG  tablet Take 1 tablet (4 mg total) by mouth every 6 (six) hours as needed for nausea or vomiting. 11/27/14  Yes Ernestina Patches, MD  pantoprazole (PROTONIX) 40 MG tablet Take 1 tablet (40 mg total) by mouth daily. 09/08/14  Yes Dorie Rank, MD  buPROPion (WELLBUTRIN XL) 150 MG 24 hr tablet Take 3 tablets (450 mg total) by mouth every morning. Patient not taking: Reported on 12/22/2014 09/18/14   Norma Fredrickson, MD  dicyclomine (BENTYL) 20 MG tablet Take 1 tablet (20 mg total) by mouth 2 (two) times daily. Patient not taking: Reported on 11/27/2014 10/28/14   Dahlia Bailiff, PA-C  fluconazole (DIFLUCAN) 100 MG tablet Take 1 tablet (100 mg total) by mouth daily. Patient not taking: Reported on 09/04/2014 08/21/14   Milus Banister, MD  HYDROcodone-acetaminophen (NORCO/VICODIN) 5-325 MG per tablet Take 1-2 tablets by mouth every 4 (four) hours as needed. Patient not taking: Reported on 11/27/2014 10/28/14   Charlann Lange, PA-C  oxyCODONE-acetaminophen (PERCOCET) 5-325 MG per tablet Take 1 tablet by mouth every 6 (six) hours as needed. Patient not taking: Reported on 12/22/2014 11/27/14   Ernestina Patches, MD  pantoprazole (PROTONIX) 40 MG tablet TAKE 1 TABLET (40 MG TOTAL) BY MOUTH DAILY. Patient not taking: Reported on 12/22/2014 12/03/14   Milus Banister, MD  promethazine (PHENERGAN) 25 MG tablet Take 1 tablet (25 mg total) by mouth every 6 (six) hours as needed for nausea. Patient not taking: Reported on 12/22/2014 12/16/14   Davonna Belling, MD  sucralfate (CARAFATE) 1 G tablet Take 1 tablet (1 g total) by mouth 4 (four) times daily -  with meals and at bedtime. Patient not taking: Reported on 10/06/2014 09/08/14   Dorie Rank, MD  Vitamin D, Ergocalciferol, (DRISDOL) 50000 UNITS CAPS capsule Take 1 capsule (50,000 Units total) by mouth every 7 (seven) days. Patient not taking: Reported on 10/27/2014 07/18/14   Terrance Mass, MD   BP 131/87 mmHg  Pulse 75  Temp(Src) 97.8 F (36.6 C) (Oral)  Resp 13  Ht 5' 3.25" (1.607  m)  Wt 150 lb (68.04 kg)  BMI 26.35 kg/m2  SpO2 96% Physical Exam  Constitutional: She is oriented to person, place, and time. She appears well-developed and well-nourished.  HENT:  Head: Normocephalic and atraumatic.  Right Ear: External ear normal.  Left Ear: External ear normal.  Nose: Nose normal.  Mouth/Throat: Oropharynx is clear and moist.  Eyes: Conjunctivae and EOM are normal. Pupils are equal, round, and reactive to light.  Neck: Normal range of motion. Neck supple.  Cardiovascular: Normal rate, regular rhythm, normal heart sounds and intact distal pulses.   Pulmonary/Chest: Effort normal and breath sounds normal.  Abdominal: Soft. Bowel sounds are normal.  Musculoskeletal: Normal range of motion.  Neurological: She is alert and oriented to person, place, and time. She has normal reflexes.  Skin: Skin is warm and dry.  Psychiatric: She has a normal mood and affect. Her behavior is normal. Judgment and thought content normal.  Nursing note and vitals reviewed.   ED Course  Procedures (including critical care time) Labs Review Labs Reviewed  BASIC METABOLIC PANEL - Abnormal; Notable for the following:    Chloride 99 (*)    Glucose, Bld 155 (*)    Calcium 8.6 (*)    All other components within normal limits  CBC - Abnormal; Notable for the following:    RBC 3.68 (*)    MCV 101.9 (*)    MCH 34.8 (*)    Platelets 126 (*)    All other components within normal limits  Randolm Idol, ED    Imaging Review Dg Chest 2 View  12/22/2014   CLINICAL DATA:  54 year old female with substernal chest pain for 30 minutes. Initial encounter. Previous cardiac surgery.  EXAM: CHEST  2 VIEW  COMPARISON:  10/28/2014 chest CTA and earlier.  FINDINGS: Upright AP and lateral views of the chest. Stable cardiac size and mediastinal contours. Sequelae of mitral valve replacement. Lung volumes are stable. No pneumothorax, pulmonary edema, pleural effusion or confluent pulmonary opacity.  Chronic thoracic compression fractures appear stable. No acute osseous abnormality identified.  IMPRESSION: No acute cardiopulmonary abnormality.   Electronically Signed   By: Genevie Ann M.D.   On: 12/22/2014 13:23   I, Erika Slaby S, personally reviewed and evaluated these images and lab results as part of my medical decision-making.   EKG Interpretation   Date/Time:  Sunday December 22 2014 11:52:29 EDT Ventricular Rate:  75 PR Interval:  186 QRS Duration: 93 QT Interval:  464 QTC Calculation: 518 R Axis:   51 Text Interpretation:  Normal sinus rhythm tachycardia and nsst changes  have resolved from first prior ekg of 12/15/14 Confirmed by Ved Martos MD, Andee Poles  [phone]) on 12/22/2014 2:18:08 PM      MDM   Final diagnoses:  Epigastric pain  Non-intractable vomiting with nausea, vomiting of unspecified type    54 year old female comes in today complaining of chest pain which resolved prior to my valuation, epigastric pain, nausea, and vomiting. She has had no witnessed vomiting here in the emergency department. She's been seen and evaluated multiple times for these complaints. She has a normal exam, normal labs, and EKG which is normal today with resolution from nonspecific changes in the past with tachycardia. Patient has had multiple evaluations for chest pain past and has no evidence today of cardiac ischemia with normal troponin and normal EKG, normal pursuing further cardiac evaluation this time. Patient currently has a lipase is pending. She is not vomiting here. Plan is discharged home to follow-up with primary care.    Pattricia Boss, MD 12/22/14 [phone]

## 2014-12-22 NOTE — ED Notes (Signed)
Pt. Presents with complaint of CP substernal/epigastric starting approx 30 min. Pt. Had some N/V, given 4 mg zofran en route. Pt. Took 324 ASA at  Home.

## 2014-12-22 NOTE — Discharge Instructions (Signed)
Abdominal Pain, Women °Abdominal (stomach, pelvic, or belly) pain can be caused by many things. It is important to tell your doctor: °· The location of the pain. °· Does it come and go or is it present all the time? °· Are there things that start the pain (eating certain foods, exercise)? °· Are there other symptoms associated with the pain (fever, nausea, vomiting, diarrhea)? °All of this is helpful to know when trying to find the cause of the pain. °CAUSES  °· Stomach: virus or bacteria infection, or ulcer. °· Intestine: appendicitis (inflamed appendix), regional ileitis (Crohn's disease), ulcerative colitis (inflamed colon), irritable bowel syndrome, diverticulitis (inflamed diverticulum of the colon), or cancer of the stomach or intestine. °· Gallbladder disease or stones in the gallbladder. °· Kidney disease, kidney stones, or infection. °· Pancreas infection or cancer. °· Fibromyalgia (pain disorder). °· Diseases of the female organs: °¨ Uterus: fibroid (non-cancerous) tumors or infection. °¨ Fallopian tubes: infection or tubal pregnancy. °¨ Ovary: cysts or tumors. °¨ Pelvic adhesions (scar tissue). °¨ Endometriosis (uterus lining tissue growing in the pelvis and on the pelvic organs). °¨ Pelvic congestion syndrome (female organs filling up with blood just before the menstrual period). °¨ Pain with the menstrual period. °¨ Pain with ovulation (producing an egg). °¨ Pain with an IUD (intrauterine device, birth control) in the uterus. °¨ Cancer of the female organs. °· Functional pain (pain not caused by a disease, may improve without treatment). °· Psychological pain. °· Depression. °DIAGNOSIS  °Your doctor will decide the seriousness of your pain by doing an examination. °· Blood tests. °· X-rays. °· Ultrasound. °· CT scan (computed tomography, special type of X-Maksim Peregoy). °· MRI (magnetic resonance imaging). °· Cultures, for infection. °· Barium enema (dye inserted in the large intestine, to better view it with  X-rays). °· Colonoscopy (looking in intestine with a lighted tube). °· Laparoscopy (minor surgery, looking in abdomen with a lighted tube). °· Major abdominal exploratory surgery (looking in abdomen with a large incision). °TREATMENT  °The treatment will depend on the cause of the pain.  °· Many cases can be observed and treated at home. °· Over-the-counter medicines recommended by your caregiver. °· Prescription medicine. °· Antibiotics, for infection. °· Birth control pills, for painful periods or for ovulation pain. °· Hormone treatment, for endometriosis. °· Nerve blocking injections. °· Physical therapy. °· Antidepressants. °· Counseling with a psychologist or psychiatrist. °· Minor or major surgery. °HOME CARE INSTRUCTIONS  °· Do not take laxatives, unless directed by your caregiver. °· Take over-the-counter pain medicine only if ordered by your caregiver. Do not take aspirin because it can cause an upset stomach or bleeding. °· Try a clear liquid diet (broth or water) as ordered by your caregiver. Slowly move to a bland diet, as tolerated, if the pain is related to the stomach or intestine. °· Have a thermometer and take your temperature several times a day, and record it. °· Bed rest and sleep, if it helps the pain. °· Avoid sexual intercourse, if it causes pain. °· Avoid stressful situations. °· Keep your follow-up appointments and tests, as your caregiver orders. °· If the pain does not go away with medicine or surgery, you may try: °¨ Acupuncture. °¨ Relaxation exercises (yoga, meditation). °¨ Group therapy. °¨ Counseling. °SEEK MEDICAL CARE IF:  °· You notice certain foods cause stomach pain. °· Your home care treatment is not helping your pain. °· You need stronger pain medicine. °· You want your IUD removed. °· You feel faint or   lightheaded. °· You develop nausea and vomiting. °· You develop a rash. °· You are having side effects or an allergy to your medicine. °SEEK IMMEDIATE MEDICAL CARE IF:  °· Your  pain does not go away or gets worse. °· You have a fever. °· Your pain is felt only in portions of the abdomen. The right side could possibly be appendicitis. The left lower portion of the abdomen could be colitis or diverticulitis. °· You are passing blood in your stools (bright red or black tarry stools, with or without vomiting). °· You have blood in your urine. °· You develop chills, with or without a fever. °· You pass out. °MAKE SURE YOU:  °· Understand these instructions. °· Will watch your condition. °· Will get help right away if you are not doing well or get worse. °Document Released: 02/21/2007 Document Revised: 09/10/2013 Document Reviewed: 03/13/2009 °[employer]® Patient Information ©2015 [employer]. This information is not intended to replace advice given to you by your health care provider. Make sure you discuss any questions you have with your health care provider. ° °

## 2014-12-30 ENCOUNTER — Other Ambulatory Visit: Payer: Self-pay

## 2014-12-30 ENCOUNTER — Encounter (HOSPITAL_COMMUNITY): Payer: Self-pay | Admitting: Emergency Medicine

## 2014-12-30 ENCOUNTER — Emergency Department (HOSPITAL_COMMUNITY): Payer: Medicare Other

## 2014-12-30 ENCOUNTER — Emergency Department (HOSPITAL_COMMUNITY)
Admission: EM | Admit: 2014-12-30 | Discharge: 2014-12-30 | Disposition: A | Discharge status: Home / Self Care | Payer: Medicare Other | Attending: Emergency Medicine | Admitting: Emergency Medicine

## 2014-12-30 DIAGNOSIS — Z862 Personal history of diseases of the blood and blood-forming organs and certain disorders involving the immune mechanism: Secondary | ICD-10-CM | POA: Diagnosis not present

## 2014-12-30 DIAGNOSIS — H9190 Unspecified hearing loss, unspecified ear: Secondary | ICD-10-CM | POA: Insufficient documentation

## 2014-12-30 DIAGNOSIS — R109 Unspecified abdominal pain: Secondary | ICD-10-CM | POA: Diagnosis present

## 2014-12-30 DIAGNOSIS — Z8639 Personal history of other endocrine, nutritional and metabolic disease: Secondary | ICD-10-CM | POA: Insufficient documentation

## 2014-12-30 DIAGNOSIS — M1990 Unspecified osteoarthritis, unspecified site: Secondary | ICD-10-CM | POA: Insufficient documentation

## 2014-12-30 DIAGNOSIS — Z7982 Long term (current) use of aspirin: Secondary | ICD-10-CM | POA: Insufficient documentation

## 2014-12-30 DIAGNOSIS — K2970 Gastritis, unspecified, without bleeding: Secondary | ICD-10-CM | POA: Diagnosis not present

## 2014-12-30 DIAGNOSIS — R404 Transient alteration of awareness: Secondary | ICD-10-CM | POA: Diagnosis not present

## 2014-12-30 DIAGNOSIS — K219 Gastro-esophageal reflux disease without esophagitis: Secondary | ICD-10-CM | POA: Insufficient documentation

## 2014-12-30 DIAGNOSIS — R05 Cough: Secondary | ICD-10-CM | POA: Diagnosis not present

## 2014-12-30 DIAGNOSIS — I10 Essential (primary) hypertension: Secondary | ICD-10-CM | POA: Insufficient documentation

## 2014-12-30 DIAGNOSIS — J3489 Other specified disorders of nose and nasal sinuses: Secondary | ICD-10-CM | POA: Diagnosis not present

## 2014-12-30 DIAGNOSIS — Z9889 Other specified postprocedural states: Secondary | ICD-10-CM | POA: Insufficient documentation

## 2014-12-30 DIAGNOSIS — R5383 Other fatigue: Secondary | ICD-10-CM | POA: Insufficient documentation

## 2014-12-30 DIAGNOSIS — G8929 Other chronic pain: Secondary | ICD-10-CM | POA: Diagnosis not present

## 2014-12-30 DIAGNOSIS — G43909 Migraine, unspecified, not intractable, without status migrainosus: Secondary | ICD-10-CM | POA: Insufficient documentation

## 2014-12-30 DIAGNOSIS — R002 Palpitations: Secondary | ICD-10-CM | POA: Diagnosis not present

## 2014-12-30 DIAGNOSIS — R531 Weakness: Secondary | ICD-10-CM | POA: Diagnosis not present

## 2014-12-30 DIAGNOSIS — R55 Syncope and collapse: Secondary | ICD-10-CM | POA: Diagnosis not present

## 2014-12-30 DIAGNOSIS — R1110 Vomiting, unspecified: Secondary | ICD-10-CM

## 2014-12-30 DIAGNOSIS — R1013 Epigastric pain: Secondary | ICD-10-CM

## 2014-12-30 DIAGNOSIS — R0989 Other specified symptoms and signs involving the circulatory and respiratory systems: Secondary | ICD-10-CM | POA: Diagnosis not present

## 2014-12-30 DIAGNOSIS — Z79899 Other long term (current) drug therapy: Secondary | ICD-10-CM | POA: Diagnosis not present

## 2014-12-30 DIAGNOSIS — R42 Dizziness and giddiness: Secondary | ICD-10-CM | POA: Diagnosis not present

## 2014-12-30 DIAGNOSIS — R000 Tachycardia, unspecified: Secondary | ICD-10-CM | POA: Insufficient documentation

## 2014-12-30 DIAGNOSIS — Z86711 Personal history of pulmonary embolism: Secondary | ICD-10-CM | POA: Insufficient documentation

## 2014-12-30 DIAGNOSIS — R0981 Nasal congestion: Secondary | ICD-10-CM

## 2014-12-30 DIAGNOSIS — R112 Nausea with vomiting, unspecified: Secondary | ICD-10-CM | POA: Diagnosis not present

## 2014-12-30 DIAGNOSIS — R110 Nausea: Secondary | ICD-10-CM

## 2014-12-30 DIAGNOSIS — R059 Cough, unspecified: Secondary | ICD-10-CM

## 2014-12-30 LAB — COMPREHENSIVE METABOLIC PANEL
ALT: 59 U/L — AB (ref 14–54)
AST: 72 U/L — AB (ref 15–41)
Albumin: 3.6 g/dL (ref 3.5–5.0)
Alkaline Phosphatase: 114 U/L (ref 38–126)
Anion gap: 14 (ref 5–15)
BUN: 9 mg/dL (ref 6–20)
CHLORIDE: 106 mmol/L (ref 101–111)
CO2: 22 mmol/L (ref 22–32)
CREATININE: 0.77 mg/dL (ref 0.44–1.00)
Calcium: 8.7 mg/dL — ABNORMAL LOW (ref 8.9–10.3)
GFR calc Af Amer: >60 mL/min (ref >60)
Glucose, Bld: 92 mg/dL (ref 65–99)
Potassium: 4 mmol/L (ref 3.5–5.1)
SODIUM: 142 mmol/L (ref 135–145)
Total Bilirubin: 0.7 mg/dL (ref 0.3–1.2)
Total Protein: 6 g/dL — ABNORMAL LOW (ref 6.5–8.1)

## 2014-12-30 LAB — CBC WITH DIFFERENTIAL/PLATELET
BASOS ABS: 0.1 10*3/uL (ref 0.0–0.1)
Basophils Relative: 1 % (ref 0–1)
EOS PCT: 2 % (ref 0–5)
Eosinophils Absolute: 0.1 10*3/uL (ref 0.0–0.7)
HCT: 35.2 % — ABNORMAL LOW (ref 36.0–46.0)
HEMOGLOBIN: 12.5 g/dL (ref 12.0–15.0)
LYMPHS PCT: 12 % (ref 12–46)
Lymphs Abs: 0.4 10*3/uL — ABNORMAL LOW (ref 0.7–4.0)
MCH: 35.5 pg — ABNORMAL HIGH (ref 26.0–34.0)
MCHC: 35.5 g/dL (ref 30.0–36.0)
MCV: 100 fL (ref 78.0–100.0)
Monocytes Absolute: 0.4 10*3/uL (ref 0.1–1.0)
Monocytes Relative: 10 % (ref 3–12)
NEUTROS PCT: 75 % (ref 43–77)
Neutro Abs: 2.6 10*3/uL (ref 1.7–7.7)
PLATELETS: 102 10*3/uL — AB (ref 150–400)
RBC: 3.52 MIL/uL — AB (ref 3.87–5.11)
RDW: 14.9 % (ref 11.5–15.5)
WBC: 3.5 10*3/uL — AB (ref 4.0–10.5)

## 2014-12-30 MED ORDER — NYSTATIN 100000 UNIT/ML MT SUSP: 500000.0000 [IU] | Freq: Four times a day (QID) | OROMUCOSAL | Status: DC

## 2014-12-30 MED ORDER — GI COCKTAIL ~~LOC~~
30.0000 mL | Freq: Once | ORAL | Status: AC
  Administered 2014-12-30: 30 mL via ORAL
  Filled 2014-12-30: qty 30

## 2014-12-30 MED ORDER — ONDANSETRON 4 MG PO TBDP
4.0000 mg | ORAL_TABLET | Freq: Once | ORAL | Status: AC
  Administered 2014-12-30: 4 mg via ORAL
  Filled 2014-12-30: qty 1

## 2014-12-30 MED ORDER — SODIUM CHLORIDE 0.9 % IV BOLUS (SEPSIS)
1000.0000 mL | Freq: Once | INTRAVENOUS | Status: AC
  Administered 2014-12-30: 1000 mL via INTRAVENOUS

## 2014-12-30 MED ORDER — PROMETHAZINE HCL 25 MG/ML IJ SOLN
25.0000 mg | Freq: Once | INTRAMUSCULAR | Status: AC
  Administered 2014-12-30: 25 mg via INTRAVENOUS
  Filled 2014-12-30: qty 1

## 2014-12-30 MED ORDER — ONDANSETRON HCL 4 MG/2ML IJ SOLN
4.0000 mg | Freq: Once | INTRAMUSCULAR | Status: AC
  Administered 2014-12-30: 4 mg via INTRAVENOUS
  Filled 2014-12-30: qty 2

## 2014-12-30 MED ORDER — FLUCONAZOLE 200 MG PO TABS: 200.0000 mg | ORAL_TABLET | Freq: Every day | ORAL | Status: AC

## 2014-12-30 NOTE — Discharge Instructions (Signed)

## 2014-12-30 NOTE — ED Notes (Signed)
Patient transported to X-ray 

## 2014-12-30 NOTE — ED Notes (Signed)
Pt resting in bed with eyes closed, quietly, states pain is still 10/10. States still nauseated. States dilaudid usually helps her pain. Notified MD who is refusing to give patient dilaudid. States will order IV phenergan.

## 2014-12-30 NOTE — ED Notes (Signed)
Pt given [place] sandwich, gingerale, per patient request for po trial

## 2014-12-30 NOTE — ED Provider Notes (Signed)
Patient seen and evaluated. Discussed at length with Dr. Lavena Bullion;.  Describes a long history of epigastric pain. Has had 2 previous episodes of candida esophagitis. Most recently treated in April of this year. Describes epigastric pain and substernal chest pain and pain with swallowing. She vomited after being nauseated and her physician's office. Symptoms do sound like a vagal episode with an episode of vomiting. Her evaluation here is normal. Exam here is normal. Think she is appropriate for discharge home. Plan will be empiric treatment for candida esophagitis.  [name], MD 12/30/14 1524

## 2014-12-30 NOTE — ED Provider Notes (Signed)
CSN: [id number]     Arrival date & time 12/30/14  1052 History   First MD Initiated Contact with Patient 12/30/14 1054     Chief Complaint  Patient presents with  . Dizziness  . Abdominal Pain  . Nasal Congestion     (Consider location/radiation/quality/duration/timing/severity/associated sxs/prior Treatment) Patient is a 54 y.o. female presenting with dizziness and abdominal pain. The history is provided by the patient and medical records. No language interpreter was used.  Dizziness Quality:  Lightheadedness Severity:  Moderate Onset quality:  Gradual Duration:  1 day Timing:  Intermittent Progression:  Waxing and waning Chronicity:  Recurrent (previous complaints noted in chart) Context: physical activity   Context: not with loss of consciousness and not with medication   Relieved by:  None tried Worsened by:  Movement and sitting upright Ineffective treatments:  None tried Associated symptoms: hearing loss (at baseline pt is hard of hearing), nausea, vomiting (reports one episode immediately prior to arrival at doctors office) and weakness (generalized )   Associated symptoms: no blood in stool, no chest pain, no diarrhea, no headaches, no palpitations, no shortness of breath, no tinnitus and no vision changes   Abdominal Pain Pain location:  Epigastric Pain radiates to:  Does not radiate Pain severity:  Severe (states "it is 10/10") Onset quality:  Gradual Duration:  1 day Timing:  Intermittent Chronicity:  Recurrent (previously similar symptoms noted in chart) Associated symptoms: cough (over last 3-4 days), fatigue, nausea and vomiting (reports one episode immediately prior to arrival at doctors office)   Associated symptoms: no chest pain, no diarrhea, no dysuria, no fever, no hematemesis, no hematochezia, no hematuria and no shortness of breath     Past Medical History  Diagnosis Date  . Depression   . Migraine   . Fibromyalgia   . Hearing impairment   .  Arthritis   . Cardiomyopathy   . Pulmonary embolism   . Gastritis   . Osteoporosis   . Anemia   . Allergy     SEASONAL  . Anxiety   . GERD (gastroesophageal reflux disease)   . Hypertension     [name], take htn medication to regulate heart beat.  . Sleep apnea   . Vitamin D deficiency    Past Surgical History  Procedure Laterality Date  . Appendectomy    . Inner ear surgery      TUBES  . Tonsillectomy and adenoidectomy    . Tubal ligation    . Laparoscopic cholecystectomy  2012  . Cardiac surgery  January 2007    mitral valve repair  . Cardiac surgery  1973    atrial septal defect  . Cochlear implant     Family History  Problem Relation Age of Onset  . Breast cancer Mother     bilateral; ages 24 and 37; TAH/BSO ~50  . Depression Sister   . Heart disease Father   . Hypertension Father   . Colon cancer Neg Hx   . Esophageal cancer Neg Hx   . Pancreatic cancer Neg Hx   . Rectal cancer Neg Hx   . Stomach cancer Neg Hx    Social History  Substance Use Topics  . Smoking status: Never Smoker   . Smokeless tobacco: Never Used  . Alcohol Use: No   OB History    Gravida Para Term Preterm AB TAB SAB Ectopic Multiple Living   2 2 2       2      Review of Systems  Constitutional: Positive for fatigue. Negative for fever.  HENT: Positive for hearing loss (at baseline pt is hard of hearing). Negative for tinnitus.   Respiratory: Positive for cough (over last 3-4 days). Negative for shortness of breath.   Cardiovascular: Negative for chest pain and palpitations.  Gastrointestinal: Positive for nausea, vomiting (reports one episode immediately prior to arrival at doctors office) and abdominal pain. Negative for diarrhea, blood in stool, hematochezia and hematemesis.  Genitourinary: Negative for dysuria and hematuria.  Neurological: Positive for dizziness and weakness (generalized ). Negative for headaches.      Allergies  Depakote and Valium  Home Medications    Prior to Admission medications   Medication Sig Start Date End Date Taking? Authorizing Provider  aspirin EC 81 MG tablet Take 81 mg by mouth daily.   Yes Historical Provider, MD  buPROPion (WELLBUTRIN XL) 150 MG 24 hr tablet Take 3 tablets (450 mg total) by mouth every morning. 09/18/14  Yes Norma Fredrickson, MD  carvedilol (COREG) 25 MG tablet TAKE 1 TABLET BY MOUTH TWICE A DAY WITH MEALS 05/06/14  Yes Jerline Pain, MD  desipramine (NORPRAMIN) 25 MG tablet 1 qam Patient taking differently: Take 25 mg by mouth daily. 1 qam 10/18/14  Yes Norma Fredrickson, MD  dicyclomine (BENTYL) 20 MG tablet Take 1 tablet (20 mg total) by mouth 2 (two) times daily. 10/28/14  Yes Joe Truitt Leep, PA-C  fexofenadine (ALLEGRA) 180 MG tablet Take 180 mg by mouth daily.   Yes Historical Provider, MD  gabapentin (NEURONTIN) 300 MG capsule Take 600 mg by mouth at bedtime.   Yes Historical Provider, MD  hydrochlorothiazide (MICROZIDE) 12.5 MG capsule Take 1 capsule (12.5 mg total) by mouth every morning. 05/16/14  Yes Jerline Pain, MD  mirabegron ER (MYRBETRIQ) 50 MG TB24 tablet Take 1 tablet (50 mg total) by mouth daily. 07/17/14  Yes Terrance Mass, MD  Omega-3 Fatty Acids (FISH OIL) 1000 MG CAPS Take 1 capsule by mouth 2 (two) times daily.    Yes Historical Provider, MD  pantoprazole (PROTONIX) 40 MG tablet Take 1 tablet (40 mg total) by mouth daily. 09/08/14  Yes Dorie Rank, MD  sucralfate (CARAFATE) 1 G tablet Take 1 tablet (1 g total) by mouth 4 (four) times daily -  with meals and at bedtime. 09/08/14  Yes Dorie Rank, MD  acetaminophen (TYLENOL) 500 MG tablet Take 500 mg by mouth every 6 (six) hours as needed for mild pain, moderate pain or headache.    Historical Provider, MD  aspirin 325 MG tablet Take 325 mg by mouth every 6 (six) hours as needed for mild pain or moderate pain.    Historical Provider, MD  fluconazole (DIFLUCAN) 100 MG tablet Take 1 tablet (100 mg total) by mouth daily. Patient not taking: Reported on 09/04/2014  08/21/14   Milus Banister, MD  fluconazole (DIFLUCAN) 200 MG tablet Take 1 tablet (200 mg total) by mouth daily. 12/30/14 01/06/15  Theodosia Quay, MD  gabapentin (NEURONTIN) 100 MG capsule Take 100 mg by mouth daily as needed (headache).     Historical Provider, MD  HYDROcodone-acetaminophen (NORCO/VICODIN) 5-325 MG per tablet Take 1-2 tablets by mouth every 4 (four) hours as needed. Patient not taking: Reported on 11/27/2014 10/28/14   Charlann Lange, PA-C  nystatin (MYCOSTATIN) 100000 UNIT/ML suspension Take 5 mLs (500,000 Units total) by mouth 4 (four) times daily. 12/30/14   Theodosia Quay, MD  ondansetron (ZOFRAN) 4 MG tablet Take 1 tablet (4 mg total) by mouth every  6 (six) hours as needed for nausea or vomiting. Patient not taking: Reported on 12/30/2014 11/27/14   Ernestina Patches, MD  oxyCODONE-acetaminophen (PERCOCET) 5-325 MG per tablet Take 1 tablet by mouth every 6 (six) hours as needed. Patient not taking: Reported on 12/22/2014 11/27/14   Ernestina Patches, MD  pantoprazole (PROTONIX) 40 MG tablet TAKE 1 TABLET (40 MG TOTAL) BY MOUTH DAILY. Patient not taking: Reported on 12/22/2014 12/03/14   Milus Banister, MD  promethazine (PHENERGAN) 25 MG tablet Take 1 tablet (25 mg total) by mouth every 6 (six) hours as needed for nausea. Patient not taking: Reported on 12/22/2014 12/16/14   Davonna Belling, MD  Vitamin D, Ergocalciferol, (DRISDOL) 50000 UNITS CAPS capsule Take 1 capsule (50,000 Units total) by mouth every 7 (seven) days. Patient not taking: Reported on 10/27/2014 07/18/14   Terrance Mass, MD   BP 113/67 mmHg  Pulse 100  Temp(Src) 98.1 F (36.7 C) (Oral)  Resp 26  SpO2 99% Physical Exam  Constitutional: She is oriented to person, place, and time. No distress.  HENT:  Head: Normocephalic and atraumatic.  Eyes: Conjunctivae and EOM are normal.  Neck: Normal range of motion. Neck supple.  Cardiovascular: Normal rate, regular rhythm and normal heart sounds.   No murmur  heard. Pulmonary/Chest: Effort normal and breath sounds normal. No respiratory distress. She has no wheezes. She has no rales.  Abdominal: Soft. Bowel sounds are normal. There is tenderness (mild epigastric ttp). There is no rebound and no guarding.  Musculoskeletal: Normal range of motion. She exhibits no tenderness.  Neurological: She is alert and oriented to person, place, and time.  Skin: Skin is warm and dry. She is not diaphoretic.  Psychiatric: She has a normal mood and affect. Her behavior is normal.  Nursing note and vitals reviewed.   ED Course  Procedures (including critical care time) Labs Review Labs Reviewed  CBC WITH DIFFERENTIAL/PLATELET - Abnormal; Notable for the following:    WBC 3.5 (*)    RBC 3.52 (*)    HCT 35.2 (*)    MCH 35.5 (*)    Platelets 102 (*)    Lymphs Abs 0.4 (*)    All other components within normal limits  COMPREHENSIVE METABOLIC PANEL - Abnormal; Notable for the following:    Calcium 8.7 (*)    Total Protein 6.0 (*)    AST 72 (*)    ALT 59 (*)    All other components within normal limits    Imaging Review Dg Chest 2 View  12/30/2014   CLINICAL DATA:  Cough, congestion, chills. Flu symptoms for 3 days. History of hypertension.  EXAM: CHEST  2 VIEW  COMPARISON:  12/22/2014  FINDINGS: Evidence of median sternotomy. Heart size upper limits of normal. Lungs are clear. No pleural effusion. Superior endplate depression at upper lumbar spine is again noted. Flattening of the hemidiaphragms may suggest an element of emphysema.  IMPRESSION: No active cardiopulmonary disease.   Electronically Signed   By: Conchita Paris M.D.   On: 12/30/2014 12:12   I have personally reviewed and evaluated these images and lab results as part of my medical decision-making.   EKG Interpretation None      MDM   Final diagnoses:  Vomiting in adult  Nausea in adult  Epigastric pain  Sinus congestion  Cough    Patient presents today with lightheadedness,  epigastric pain, sinus congestion, cough that started over the last 4 days. Patient was notably seen at her primary doctor's  office prior to coming here and was seen for further evaluation given her lightheadedness and vomiting. On arrival patient is mildly tachycardic but otherwise hemodynamically stable. She is afebrile and nontoxic appearing. Patient does not have an acute abdomen or findings that would be consistent with peritonitis. Chest x-ray does not show any focal opacities and would indicate a pneumonia or other lower respiratory tract infectious process. Patient has a history of chronic epigastric pain. Only mild elevations in LFTs today, these findings are nonspecific and not elevated to the point that I would suspect an acute hepatitis. Patient was PO trialed here and tolerated a [place] sandwich and ginger ale without vomiting. Her symptoms didn't significantly improve with Phenergan IV and 1 L bolus of IV fluids. Discussed reassuring findings today with patient and will plan discharge her home with Diflucan as she has a history of chronic yeast infections. Patient is agreeable with this plan and was discharged home in good condition.    Theodosia Quay, MD 12/30/14 1535  [name], MD 01/03/15 [id number]

## 2014-12-30 NOTE — ED Notes (Signed)
Pt arrives via EMS from doctors office with sinus congestion and pressure since last Thurs. Sent over for further evaluation of symptoms. Pt c/o dizziness, nausea, abdominal pain and SOB. 20g Lhand, 4 zofran, 218CE bolus for systolic bp initial of 90. CBG 103. Pt alert, oriented x4, actively vomiting.

## 2015-01-08 ENCOUNTER — Ambulatory Visit (HOSPITAL_COMMUNITY): Payer: Self-pay | Admitting: Psychiatry

## 2015-01-17 ENCOUNTER — Telehealth (HOSPITAL_COMMUNITY): Payer: Self-pay

## 2015-01-17 NOTE — Telephone Encounter (Signed)
Medication Management - Patient reports problems with Desipramine. States was not taking regularly due to being in the hospital a lot.  Patient started Desipramine 01/12/15. States she is unsteady, smacking lips, tongue movements and chin moving.  Called Dr. Salem Senate as Dr. Casimiro Needle is off this date who wants patient to stop Desipramine, thinks interacting with Bentyl and Phenergan and to instruct patient to take one OTC Benadryl 25mg .  Patient to call our office back on Monday 01/20/15 to let us know how she is doing and if better.  Called patient back to give instructions and patient verbalized back to this nurse the instructions and agreement with plan.  Patient to call back on 01/20/15 and will seek emergent care if problems increase.

## 2015-01-21 DIAGNOSIS — M545 Low back pain: Secondary | ICD-10-CM | POA: Diagnosis not present

## 2015-01-29 DIAGNOSIS — G43111 Migraine with aura, intractable, with status migrainosus: Secondary | ICD-10-CM | POA: Diagnosis not present

## 2015-01-29 DIAGNOSIS — R51 Headache: Secondary | ICD-10-CM | POA: Diagnosis not present

## 2015-01-29 DIAGNOSIS — M545 Low back pain: Secondary | ICD-10-CM | POA: Diagnosis not present

## 2015-01-29 DIAGNOSIS — M791 Myalgia: Secondary | ICD-10-CM | POA: Diagnosis not present

## 2015-01-29 DIAGNOSIS — G518 Other disorders of facial nerve: Secondary | ICD-10-CM | POA: Diagnosis not present

## 2015-01-29 DIAGNOSIS — M542 Cervicalgia: Secondary | ICD-10-CM | POA: Diagnosis not present

## 2015-01-29 DIAGNOSIS — G43719 Chronic migraine without aura, intractable, without status migrainosus: Secondary | ICD-10-CM | POA: Diagnosis not present

## 2015-01-30 ENCOUNTER — Ambulatory Visit (INDEPENDENT_AMBULATORY_CARE_PROVIDER_SITE_OTHER): Payer: Medicare Other | Admitting: Psychiatry

## 2015-01-30 DIAGNOSIS — F331 Major depressive disorder, recurrent, moderate: Secondary | ICD-10-CM | POA: Diagnosis not present

## 2015-01-30 MED ORDER — BUPROPION HCL ER (XL) 150 MG PO TB24: 450.0000 mg | ORAL_TABLET | Freq: Every morning | ORAL | Status: DC

## 2015-01-30 NOTE — Progress Notes (Signed)
[facility] MD Progress Note  01/30/2015 1:35 PM [name]  MRN:  [id number] Subjective:  Feels good Principal Problem: Depression Diagnosis:  Major depression, recurrent episode moderate Today the patient is feeling well. She's had a number of gastroenterology problems. Tells like he has esophageal spasm. She has a yeast infection in her esophagus as well. She's been in the hospital since I've seen her. Now her GI tract seems to be better and when he got better she then started taking the desipramine as ordered. She said it made her feel dizzy and fuzzy. She says she had problems with balance. She took it about 5 days and then discontinued it. The patient actually is very sensitive to medications. She says that she's been on Precedex depresses like that in the past and had problems. It is noted the patient was on Cymbalta and had significant side effects. She has no side effects from 450 mg of Wellbutrin that she's taking. In reality this time she denies daily depression and feels pretty well. She has a good relationship with her boyfriend. She is sleeping and eating well. The patient denies the use of alcohol or drugs but apparently a few years ago had problems with alcohol. She demonstrates no psychosis at all. I think it's easy to misinterpret this patient. I think she is doing fine I don't think she needs anything additional to the high dose of Wellbutrin that she is on. I think she is very sensitive to medications. She is very engaged with her therapist which is great. Patient's headaches are less and her vision is much improved. She denies being suicidal or homicidal. She denies any other neurological symptoms. She denies any chest pain or shortness of breath. Patient Active Problem List   Diagnosis Date Noted  . Major depressive disorder, recurrent episode, moderate [F33.1] 09/18/2014  . Non-traumatic compression fracture of vertebral column with routine healing [M48.50XD] 07/17/2014  . Osteoporosis  [M81.0] 07/17/2014  . H/O vitamin D deficiency [Z86.39] 07/17/2014  . Odynophagia [R13.10] 07/09/2014  . Dysphagia [R13.10] 07/08/2014  . Chest pain [R07.9] 05/20/2014  . Nausea & vomiting [R11.2] 05/20/2014  . 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 Clinical depression [F32.9] 03/29/2014  . Breath shortness [R06.02] 03/29/2014  . Hypercholesterolemia without hypertriglyceridemia [E78.0] 03/29/2014  . Fatigue [R53.83] 03/29/2014  . Classical migraine with intractable migraine [G43.119] 03/29/2014  . Disorder of mitral valve [I05.9] 03/29/2014  . Atypical migraine [G43.809] 03/29/2014  . Atrial septal defect of fossa ovalis [Q21.1] 03/29/2014  . HLD (hyperlipidemia) [E78.5] 03/29/2014  . Arthralgia of multiple joints [M25.50] 03/29/2014  . Anemia, iron deficiency [D50.9] 03/29/2014  . Fibrositis [M79.7] 03/29/2014  . Abdominal pain [R10.9] 12/20/2013  . Absolute anemia [D64.9] 09/20/2013  . Bronchitis [J40] 09/20/2013  . Vaginitis and vulvovaginitis [N76.0] 09/20/2013  . Family history of breast cancer [Z80.3] 07/13/2013  . Palpitation [R00.2] 07/02/2013  . History of mitral valve repair [Z98.89] 07/02/2013  . Status post patch closure of ASD [Z98.89] 07/02/2013  . Hyperlipidemia [E78.5] 07/02/2013  . OAB (overactive bladder) [N32.81] 06/13/2013  . Ovarian cyst, left [N83.20] 03/15/2013  . Vaginal lesion [N89.8] 03/15/2013  . Ovarian mass [N83.9] 03/15/2013  . Maternal DVT (deep vein thrombosis), history of [Z86.718] 02/26/2013  . PMB (postmenopausal bleeding) [N95.0] 02/26/2013  . Alcohol dependence [F10.20] 11/07/2012  . Unspecified vitamin D deficiency [E55.9] 08/17/2012  . L1 vertebral fracture [S32.019A] 08/01/2012  . Spinal compression fracture [M48.50XA] 07/12/2012  . Menopausal state [N95.1] 07/12/2012  . Psoriasis [  L40.9] 07/12/2012  . Major depressive disorder, recurrent episode, severe, without mention of psychotic  behavior [F33.2] 12/07/2011  . Generalized anxiety disorder [F41.1] 12/07/2011  . Cardiomyopathy [425] 03/16/2011  . Hearing impairment [H91.90]   . Migraine [346]   . Arthritis [M19.90]    Total Time spent with patient: 30 minutes   Past Medical History:  Past Medical History  Diagnosis Date  . Depression   . Migraine   . Fibromyalgia   . Hearing impairment   . Arthritis   . Cardiomyopathy   . Pulmonary embolism   . Gastritis   . Osteoporosis   . Anemia   . Allergy     SEASONAL  . Anxiety   . GERD (gastroesophageal reflux disease)   . Hypertension     [name], take htn medication to regulate heart beat.  . Sleep apnea   . Vitamin D deficiency     Past Surgical History  Procedure Laterality Date  . Appendectomy    . Inner ear surgery      TUBES  . Tonsillectomy and adenoidectomy    . Tubal ligation    . Laparoscopic cholecystectomy  2012  . Cardiac surgery  January 2007    mitral valve repair  . Cardiac surgery  1973    atrial septal defect  . Cochlear implant     Family History:  Family History  Problem Relation Age of Onset  . Breast cancer Mother     bilateral; ages 18 and 39; TAH/BSO ~50  . Depression Sister   . Heart disease Father   . Hypertension Father   . Colon cancer Neg Hx   . Esophageal cancer Neg Hx   . Pancreatic cancer Neg Hx   . Rectal cancer Neg Hx   . Stomach cancer Neg Hx    Social History:  History  Alcohol Use No     History  Drug Use No    Social History   Social History  . Marital Status: Divorced    Spouse Name: N/A  . Number of Children: N/A  . Years of Education: N/A   Social History Main Topics  . Smoking status: Never Smoker   . Smokeless tobacco: Never Used  . Alcohol Use: No  . Drug Use: No  . Sexual Activity: Yes    Birth Control/ Protection: Surgical     Comment: 1st intercourse- 19, partners- 3   Other Topics Concern  . Not on file   Social History Narrative   Additional History:    Sleep:  Good  Appetite:  Good   Assessment:   Musculoskeletal: Strength & Muscle Tone: within normal limits Gait & Station: normal Patient leans: Right   Psychiatric Specialty Exam: Physical Exam  ROS  There were no vitals taken for this visit.There is no weight on file to calculate BMI.  General Appearance: Casual  Eye Contact::  Good  Speech:  Clear and Coherent  Volume:  Normal  Mood:  Euthymic  Affect:  Appropriate  Thought Process:  Coherent  Orientation:  Full (Time, Place, and Person)  Thought Content:  WDL  Suicidal Thoughts:  No  Homicidal Thoughts:  No  Memory:  NA  Judgement:  Good  Insight:  Good  Psychomotor Activity:  Normal  Concentration:  Good  Recall:  Good  Fund of Knowledge:Good  Language: Good  Akathisia:  No  Handed:  Right  AIMS (if indicated):     Assets:  Desire for Improvement  ADL's:  Intact  Cognition: WNL  Sleep:        Current Medications: Current Outpatient Prescriptions  Medication Sig Dispense Refill  . acetaminophen (TYLENOL) 500 MG tablet Take 500 mg by mouth every 6 (six) hours as needed for mild pain, moderate pain or headache.    [name] aspirin 325 MG tablet Take 325 mg by mouth every 6 (six) hours as needed for mild pain or moderate pain.    [name] aspirin EC 81 MG tablet Take 81 mg by mouth daily.    [name] buPROPion (WELLBUTRIN XL) 150 MG 24 hr tablet Take 3 tablets (450 mg total) by mouth every morning. 90 tablet 5  . carvedilol (COREG) 25 MG tablet TAKE 1 TABLET BY MOUTH TWICE A DAY WITH MEALS 60 tablet 3  . dicyclomine (BENTYL) 20 MG tablet Take 1 tablet (20 mg total) by mouth 2 (two) times daily. 20 tablet 0  . fexofenadine (ALLEGRA) 180 MG tablet Take 180 mg by mouth daily.    . fluconazole (DIFLUCAN) 100 MG tablet Take 1 tablet (100 mg total) by mouth daily. (Patient not taking: Reported on 09/04/2014) 20 tablet 3  . gabapentin (NEURONTIN) 100 MG capsule Take 100 mg by mouth daily as needed (headache).     . gabapentin (NEURONTIN) 300  MG capsule Take 600 mg by mouth at bedtime.    . hydrochlorothiazide (MICROZIDE) 12.5 MG capsule Take 1 capsule (12.5 mg total) by mouth every morning. 90 capsule 2  . HYDROcodone-acetaminophen (NORCO/VICODIN) 5-325 MG per tablet Take 1-2 tablets by mouth every 4 (four) hours as needed. (Patient not taking: Reported on 11/27/2014) 12 tablet 0  . mirabegron ER (MYRBETRIQ) 50 MG TB24 tablet Take 1 tablet (50 mg total) by mouth daily. 30 tablet 11  . nystatin (MYCOSTATIN) 100000 UNIT/ML suspension Take 5 mLs (500,000 Units total) by mouth 4 (four) times daily. 60 mL 0  . Omega-3 Fatty Acids (FISH OIL) 1000 MG CAPS Take 1 capsule by mouth 2 (two) times daily.     . ondansetron (ZOFRAN) 4 MG tablet Take 1 tablet (4 mg total) by mouth every 6 (six) hours as needed for nausea or vomiting. (Patient not taking: Reported on 12/30/2014) 20 tablet 0  . oxyCODONE-acetaminophen (PERCOCET) 5-325 MG per tablet Take 1 tablet by mouth every 6 (six) hours as needed. (Patient not taking: Reported on 12/22/2014) 20 tablet 0  . pantoprazole (PROTONIX) 40 MG tablet Take 1 tablet (40 mg total) by mouth daily. 90 tablet 3  . pantoprazole (PROTONIX) 40 MG tablet TAKE 1 TABLET (40 MG TOTAL) BY MOUTH DAILY. (Patient not taking: Reported on 12/22/2014) 90 tablet 1  . promethazine (PHENERGAN) 25 MG tablet Take 1 tablet (25 mg total) by mouth every 6 (six) hours as needed for nausea. (Patient not taking: Reported on 12/22/2014) 10 tablet 0  . sucralfate (CARAFATE) 1 G tablet Take 1 tablet (1 g total) by mouth 4 (four) times daily -  with meals and at bedtime. 28 tablet 0  . Vitamin D, Ergocalciferol, (DRISDOL) 50000 UNITS CAPS capsule Take 1 capsule (50,000 Units total) by mouth every 7 (seven) days. (Patient not taking: Reported on 10/27/2014) 12 capsule 0   No current facility-administered medications for this visit.    Lab Results: No results found for this or any previous visit (from the past 48 hour(s)).  Physical  Findings: AIMS:  , ,  ,  ,    CIWA:    COWS:     Treatment Plan Summary: This patient's #1 problem is  that of major depression. All her vegetative symptoms are stable and she denies being depressed. Her treatment was to give Wellbutrin and possibly to add things but that end up not being a good thing. She could not tolerate desipramine. While she claims the Pristiq has helped in the past unfortunately she could not afford it and Cymbalta caused a great deal of problems. When the patient took her desipramine that we added she describes essentially some tardive dyskinesia symptoms. I do not understand how TD occur from desipramine. It should be noted that in the chart it says that she's been on Phenergan which could possibly induced TD. The patient claimed that she never really had such side effects before. Nonetheless although side effects are gone at this time. The patient claims Wellbutrin is helpful. At this time the patient is actively in treatment Duwaine Maxin in therapy.   Medical Decision Making:  Established Problem, Stable/Improving (1)     PLOVSKY, GERALD IRVING 01/30/2015, 1:35 PM

## 2015-01-31 ENCOUNTER — Other Ambulatory Visit: Payer: Self-pay | Admitting: Cardiology

## 2015-01-31 DIAGNOSIS — I2510 Atherosclerotic heart disease of native coronary artery without angina pectoris: Secondary | ICD-10-CM | POA: Diagnosis not present

## 2015-01-31 DIAGNOSIS — M797 Fibromyalgia: Secondary | ICD-10-CM | POA: Diagnosis not present

## 2015-01-31 DIAGNOSIS — E785 Hyperlipidemia, unspecified: Secondary | ICD-10-CM | POA: Diagnosis not present

## 2015-01-31 DIAGNOSIS — M169 Osteoarthritis of hip, unspecified: Secondary | ICD-10-CM | POA: Diagnosis not present

## 2015-01-31 DIAGNOSIS — M810 Age-related osteoporosis without current pathological fracture: Secondary | ICD-10-CM | POA: Diagnosis not present

## 2015-01-31 DIAGNOSIS — H9190 Unspecified hearing loss, unspecified ear: Secondary | ICD-10-CM | POA: Diagnosis not present

## 2015-01-31 DIAGNOSIS — R51 Headache: Secondary | ICD-10-CM | POA: Diagnosis not present

## 2015-01-31 DIAGNOSIS — D509 Iron deficiency anemia, unspecified: Secondary | ICD-10-CM | POA: Diagnosis not present

## 2015-01-31 DIAGNOSIS — I10 Essential (primary) hypertension: Secondary | ICD-10-CM | POA: Diagnosis not present

## 2015-01-31 DIAGNOSIS — I059 Rheumatic mitral valve disease, unspecified: Secondary | ICD-10-CM | POA: Diagnosis not present

## 2015-01-31 DIAGNOSIS — F329 Major depressive disorder, single episode, unspecified: Secondary | ICD-10-CM | POA: Diagnosis not present

## 2015-02-05 ENCOUNTER — Telehealth (HOSPITAL_COMMUNITY): Payer: Self-pay

## 2015-02-05 DIAGNOSIS — I10 Essential (primary) hypertension: Secondary | ICD-10-CM | POA: Diagnosis not present

## 2015-02-05 DIAGNOSIS — D509 Iron deficiency anemia, unspecified: Secondary | ICD-10-CM | POA: Diagnosis not present

## 2015-02-05 DIAGNOSIS — F329 Major depressive disorder, single episode, unspecified: Secondary | ICD-10-CM | POA: Diagnosis not present

## 2015-02-05 DIAGNOSIS — E785 Hyperlipidemia, unspecified: Secondary | ICD-10-CM | POA: Diagnosis not present

## 2015-02-05 NOTE — Telephone Encounter (Signed)
Medication management - Called patient's [name] plan to assist wtih a prior authoirzation for continued coverage of Wellbutrin XL 150mg , 3 a day.  Medication approved till end of coverage date 05/10/15 with ID#[id number].  Called patient's [facility] and spoke with Alfozo, pharmacist to inform patient's Wellbutrin XL coverage was approved and patient's medication will be filled as ordered.

## 2015-02-12 DIAGNOSIS — G43719 Chronic migraine without aura, intractable, without status migrainosus: Secondary | ICD-10-CM | POA: Diagnosis not present

## 2015-02-12 DIAGNOSIS — M791 Myalgia: Secondary | ICD-10-CM | POA: Diagnosis not present

## 2015-02-12 DIAGNOSIS — G518 Other disorders of facial nerve: Secondary | ICD-10-CM | POA: Diagnosis not present

## 2015-02-12 DIAGNOSIS — G43111 Migraine with aura, intractable, with status migrainosus: Secondary | ICD-10-CM | POA: Diagnosis not present

## 2015-02-12 DIAGNOSIS — R51 Headache: Secondary | ICD-10-CM | POA: Diagnosis not present

## 2015-02-12 DIAGNOSIS — M542 Cervicalgia: Secondary | ICD-10-CM | POA: Diagnosis not present

## 2015-02-12 DIAGNOSIS — M545 Low back pain: Secondary | ICD-10-CM | POA: Diagnosis not present

## 2015-02-26 DIAGNOSIS — G43111 Migraine with aura, intractable, with status migrainosus: Secondary | ICD-10-CM | POA: Diagnosis not present

## 2015-02-26 DIAGNOSIS — R51 Headache: Secondary | ICD-10-CM | POA: Diagnosis not present

## 2015-02-26 DIAGNOSIS — G518 Other disorders of facial nerve: Secondary | ICD-10-CM | POA: Diagnosis not present

## 2015-02-26 DIAGNOSIS — M791 Myalgia: Secondary | ICD-10-CM | POA: Diagnosis not present

## 2015-02-26 DIAGNOSIS — M542 Cervicalgia: Secondary | ICD-10-CM | POA: Diagnosis not present

## 2015-02-26 DIAGNOSIS — G43719 Chronic migraine without aura, intractable, without status migrainosus: Secondary | ICD-10-CM | POA: Diagnosis not present

## 2015-02-27 DIAGNOSIS — M545 Low back pain: Secondary | ICD-10-CM | POA: Diagnosis not present

## 2015-03-09 ENCOUNTER — Other Ambulatory Visit: Payer: Self-pay | Admitting: Cardiology

## 2015-03-12 DIAGNOSIS — M542 Cervicalgia: Secondary | ICD-10-CM | POA: Diagnosis not present

## 2015-03-12 DIAGNOSIS — M791 Myalgia: Secondary | ICD-10-CM | POA: Diagnosis not present

## 2015-03-12 DIAGNOSIS — R51 Headache: Secondary | ICD-10-CM | POA: Diagnosis not present

## 2015-03-12 DIAGNOSIS — G518 Other disorders of facial nerve: Secondary | ICD-10-CM | POA: Diagnosis not present

## 2015-03-12 DIAGNOSIS — G43719 Chronic migraine without aura, intractable, without status migrainosus: Secondary | ICD-10-CM | POA: Diagnosis not present

## 2015-03-12 DIAGNOSIS — G43111 Migraine with aura, intractable, with status migrainosus: Secondary | ICD-10-CM | POA: Diagnosis not present

## 2015-03-13 DIAGNOSIS — E876 Hypokalemia: Secondary | ICD-10-CM | POA: Diagnosis not present

## 2015-03-13 DIAGNOSIS — R946 Abnormal results of thyroid function studies: Secondary | ICD-10-CM | POA: Diagnosis not present

## 2015-03-13 DIAGNOSIS — R52 Pain, unspecified: Secondary | ICD-10-CM | POA: Diagnosis not present

## 2015-03-13 DIAGNOSIS — J029 Acute pharyngitis, unspecified: Secondary | ICD-10-CM | POA: Diagnosis not present

## 2015-03-13 DIAGNOSIS — R7989 Other specified abnormal findings of blood chemistry: Secondary | ICD-10-CM | POA: Diagnosis not present

## 2015-03-20 DIAGNOSIS — M791 Myalgia: Secondary | ICD-10-CM | POA: Diagnosis not present

## 2015-03-20 DIAGNOSIS — M797 Fibromyalgia: Secondary | ICD-10-CM | POA: Diagnosis not present

## 2015-03-20 DIAGNOSIS — M810 Age-related osteoporosis without current pathological fracture: Secondary | ICD-10-CM | POA: Diagnosis not present

## 2015-03-20 DIAGNOSIS — R51 Headache: Secondary | ICD-10-CM | POA: Diagnosis not present

## 2015-03-20 DIAGNOSIS — H9190 Unspecified hearing loss, unspecified ear: Secondary | ICD-10-CM | POA: Diagnosis not present

## 2015-03-29 ENCOUNTER — Encounter (HOSPITAL_COMMUNITY): Payer: Self-pay

## 2015-03-29 ENCOUNTER — Emergency Department (HOSPITAL_COMMUNITY)
Admission: EM | Admit: 2015-03-29 | Discharge: 2015-03-29 | Disposition: A | Discharge status: Home / Self Care | Payer: Medicare Other | Attending: Emergency Medicine | Admitting: Emergency Medicine

## 2015-03-29 ENCOUNTER — Emergency Department (HOSPITAL_COMMUNITY): Payer: Medicare Other

## 2015-03-29 DIAGNOSIS — M810 Age-related osteoporosis without current pathological fracture: Secondary | ICD-10-CM | POA: Diagnosis not present

## 2015-03-29 DIAGNOSIS — R109 Unspecified abdominal pain: Secondary | ICD-10-CM | POA: Diagnosis not present

## 2015-03-29 DIAGNOSIS — M797 Fibromyalgia: Secondary | ICD-10-CM | POA: Diagnosis not present

## 2015-03-29 DIAGNOSIS — R112 Nausea with vomiting, unspecified: Secondary | ICD-10-CM | POA: Insufficient documentation

## 2015-03-29 DIAGNOSIS — Z8719 Personal history of other diseases of the digestive system: Secondary | ICD-10-CM | POA: Diagnosis not present

## 2015-03-29 DIAGNOSIS — I10 Essential (primary) hypertension: Secondary | ICD-10-CM | POA: Insufficient documentation

## 2015-03-29 DIAGNOSIS — R1010 Upper abdominal pain, unspecified: Secondary | ICD-10-CM | POA: Diagnosis present

## 2015-03-29 DIAGNOSIS — Z862 Personal history of diseases of the blood and blood-forming organs and certain disorders involving the immune mechanism: Secondary | ICD-10-CM | POA: Insufficient documentation

## 2015-03-29 DIAGNOSIS — Z86711 Personal history of pulmonary embolism: Secondary | ICD-10-CM | POA: Insufficient documentation

## 2015-03-29 DIAGNOSIS — H9190 Unspecified hearing loss, unspecified ear: Secondary | ICD-10-CM | POA: Diagnosis not present

## 2015-03-29 DIAGNOSIS — Z7982 Long term (current) use of aspirin: Secondary | ICD-10-CM | POA: Insufficient documentation

## 2015-03-29 DIAGNOSIS — E559 Vitamin D deficiency, unspecified: Secondary | ICD-10-CM | POA: Diagnosis not present

## 2015-03-29 DIAGNOSIS — R5383 Other fatigue: Secondary | ICD-10-CM | POA: Insufficient documentation

## 2015-03-29 DIAGNOSIS — Z79899 Other long term (current) drug therapy: Secondary | ICD-10-CM | POA: Insufficient documentation

## 2015-03-29 DIAGNOSIS — M1990 Unspecified osteoarthritis, unspecified site: Secondary | ICD-10-CM | POA: Insufficient documentation

## 2015-03-29 DIAGNOSIS — Z3202 Encounter for pregnancy test, result negative: Secondary | ICD-10-CM | POA: Insufficient documentation

## 2015-03-29 DIAGNOSIS — R197 Diarrhea, unspecified: Secondary | ICD-10-CM | POA: Diagnosis not present

## 2015-03-29 DIAGNOSIS — Z9889 Other specified postprocedural states: Secondary | ICD-10-CM | POA: Insufficient documentation

## 2015-03-29 DIAGNOSIS — G43909 Migraine, unspecified, not intractable, without status migrainosus: Secondary | ICD-10-CM | POA: Insufficient documentation

## 2015-03-29 DIAGNOSIS — F419 Anxiety disorder, unspecified: Secondary | ICD-10-CM | POA: Insufficient documentation

## 2015-03-29 DIAGNOSIS — R1013 Epigastric pain: Secondary | ICD-10-CM | POA: Diagnosis not present

## 2015-03-29 LAB — CBC
HCT: 39.3 % (ref 36.0–46.0)
Hemoglobin: 13.5 g/dL (ref 12.0–15.0)
MCH: 33.6 pg (ref 26.0–34.0)
MCHC: 34.4 g/dL (ref 30.0–36.0)
MCV: 97.8 fL (ref 78.0–100.0)
PLATELETS: 231 10*3/uL (ref 150–400)
RBC: 4.02 MIL/uL (ref 3.87–5.11)
RDW: 14.1 % (ref 11.5–15.5)
WBC: 7.4 10*3/uL (ref 4.0–10.5)

## 2015-03-29 LAB — URINALYSIS, ROUTINE W REFLEX MICROSCOPIC
BILIRUBIN URINE: NEGATIVE
Glucose, UA: NEGATIVE mg/dL
Ketones, ur: 40 mg/dL — AB
Leukocytes, UA: NEGATIVE
Nitrite: NEGATIVE
PROTEIN: NEGATIVE mg/dL
Specific Gravity, Urine: 1.023 (ref 1.005–1.030)
pH: 6 (ref 5.0–8.0)

## 2015-03-29 LAB — COMPREHENSIVE METABOLIC PANEL
ALK PHOS: 91 U/L (ref 38–126)
ALT: 29 U/L (ref 14–54)
AST: 39 U/L (ref 15–41)
Albumin: 4.5 g/dL (ref 3.5–5.0)
Anion gap: 19 — ABNORMAL HIGH (ref 5–15)
BILIRUBIN TOTAL: 0.8 mg/dL (ref 0.3–1.2)
BUN: 18 mg/dL (ref 6–20)
CALCIUM: 8.9 mg/dL (ref 8.9–10.3)
CO2: 19 mmol/L — AB (ref 22–32)
CREATININE: 0.93 mg/dL (ref 0.44–1.00)
Chloride: 105 mmol/L (ref 101–111)
GFR calc non Af Amer: >60 mL/min (ref >60)
GLUCOSE: 90 mg/dL (ref 65–99)
Potassium: 3.8 mmol/L (ref 3.5–5.1)
SODIUM: 143 mmol/L (ref 135–145)
TOTAL PROTEIN: 7.9 g/dL (ref 6.5–8.1)

## 2015-03-29 LAB — I-STAT BETA HCG BLOOD, ED (MC, WL, AP ONLY): I-stat hCG, quantitative: <5 m[IU]/mL (ref <5)

## 2015-03-29 LAB — URINE MICROSCOPIC-ADD ON

## 2015-03-29 LAB — LIPASE, BLOOD: Lipase: 40 U/L (ref 11–51)

## 2015-03-29 MED ORDER — OXYCODONE-ACETAMINOPHEN 5-325 MG PO TABS: 1.0000 | ORAL_TABLET | Freq: Four times a day (QID) | ORAL | Status: DC | PRN

## 2015-03-29 MED ORDER — GI COCKTAIL ~~LOC~~
30.0000 mL | Freq: Once | ORAL | Status: AC
  Administered 2015-03-29: 30 mL via ORAL
  Filled 2015-03-29: qty 30

## 2015-03-29 MED ORDER — SODIUM CHLORIDE 0.9 % IV BOLUS (SEPSIS)
500.0000 mL | Freq: Once | INTRAVENOUS | Status: AC
  Administered 2015-03-29: 500 mL via INTRAVENOUS

## 2015-03-29 MED ORDER — ONDANSETRON HCL 4 MG/2ML IJ SOLN
4.0000 mg | Freq: Once | INTRAMUSCULAR | Status: AC
  Administered 2015-03-29: 4 mg via INTRAVENOUS
  Filled 2015-03-29: qty 2

## 2015-03-29 MED ORDER — SODIUM CHLORIDE 0.9 % IV BOLUS (SEPSIS)
1000.0000 mL | Freq: Once | INTRAVENOUS | Status: AC
Start: 2015-03-29 — End: 2015-03-29
  Administered 2015-03-29: 1000 mL via INTRAVENOUS

## 2015-03-29 MED ORDER — FENTANYL CITRATE (PF) 100 MCG/2ML IJ SOLN
50.0000 ug | Freq: Once | INTRAMUSCULAR | Status: AC
  Administered 2015-03-29: 50 ug via INTRAVENOUS
  Filled 2015-03-29: qty 2

## 2015-03-29 MED ORDER — PANTOPRAZOLE SODIUM 40 MG PO TBEC: DELAYED_RELEASE_TABLET | ORAL | Status: DC

## 2015-03-29 MED ORDER — PROMETHAZINE HCL 25 MG PO TABS: 25.0000 mg | ORAL_TABLET | Freq: Four times a day (QID) | ORAL | Status: DC | PRN

## 2015-03-29 MED ORDER — MORPHINE SULFATE (PF) 4 MG/ML IV SOLN
4.0000 mg | Freq: Once | INTRAVENOUS | Status: AC
  Administered 2015-03-29: 4 mg via INTRAVENOUS
  Filled 2015-03-29: qty 1

## 2015-03-29 MED ORDER — IOHEXOL 300 MG/ML  SOLN
25.0000 mL | Freq: Once | INTRAMUSCULAR | Status: AC | PRN
  Administered 2015-03-29: 25 mL via ORAL

## 2015-03-29 MED ORDER — IOHEXOL 300 MG/ML  SOLN
100.0000 mL | Freq: Once | INTRAMUSCULAR | Status: AC | PRN
  Administered 2015-03-29: 100 mL via INTRAVENOUS

## 2015-03-29 NOTE — ED Provider Notes (Signed)
CSN: [id number]     Arrival date & time 03/29/15  1343 History   First MD Initiated Contact with Patient 03/29/15 1454     Chief Complaint  Patient presents with  . Abdominal Pain  . Emesis  . Diarrhea    Patient has some difficulty hearing but is able to read lips and understand most than was her hearing aids. (Consider location/radiation/quality/duration/timing/severity/associated sxs/prior Treatment) Patient is a 54 y.o. female presenting with abdominal pain, vomiting, and diarrhea. The history is provided by the patient.  Abdominal Pain Associated symptoms: diarrhea, fatigue, nausea and vomiting   Associated symptoms: no chest pain and no shortness of breath   Emesis Associated symptoms: abdominal pain and diarrhea   Associated symptoms: no headaches   Diarrhea Associated symptoms: abdominal pain and vomiting   Associated symptoms: no headaches    patient presents with nausea vomiting diarrhea and upper abdominal pain. States she feels weak. Began earlier today. States she's had some chills when she was thrown up. No confusion. States she feels weak all over. The pain is in her upper abdomen. It is dull and constant. No dysuria.  Past Medical History  Diagnosis Date  . Depression   . Migraine   . Fibromyalgia   . Hearing impairment   . Arthritis   . Cardiomyopathy   . Pulmonary embolism ([place])   . Gastritis   . Osteoporosis   . Anemia   . Allergy     SEASONAL  . Anxiety   . GERD (gastroesophageal reflux disease)   . Hypertension     [name], take htn medication to regulate heart beat.  . Sleep apnea   . Vitamin D deficiency    Past Surgical History  Procedure Laterality Date  . Appendectomy    . Inner ear surgery      TUBES  . Tonsillectomy and adenoidectomy    . Tubal ligation    . Laparoscopic cholecystectomy  2012  . Cardiac surgery  January 2007    mitral valve repair  . Cardiac surgery  1973    atrial septal defect  . Cochlear implant     Family  History  Problem Relation Age of Onset  . Breast cancer Mother     bilateral; ages 78 and 31; TAH/BSO ~50  . Depression Sister   . Heart disease Father   . Hypertension Father   . Colon cancer Neg Hx   . Esophageal cancer Neg Hx   . Pancreatic cancer Neg Hx   . Rectal cancer Neg Hx   . Stomach cancer Neg Hx    Social History  Substance Use Topics  . Smoking status: Never Smoker   . Smokeless tobacco: Never Used  . Alcohol Use: No   OB History    Gravida Para Term Preterm AB TAB SAB Ectopic Multiple Living   2 2 2       2      Review of Systems  Constitutional: Positive for fatigue. Negative for activity change and appetite change.  Eyes: Negative for pain.  Respiratory: Negative for chest tightness and shortness of breath.   Cardiovascular: Negative for chest pain and leg swelling.  Gastrointestinal: Positive for nausea, vomiting, abdominal pain and diarrhea.  Genitourinary: Negative for flank pain.  Musculoskeletal: Negative for back pain and neck stiffness.  Skin: Negative for rash.  Neurological: Negative for weakness, numbness and headaches.  Psychiatric/Behavioral: Negative for behavioral problems.      Allergies  Depakote and Valium  Home Medications  Prior to Admission medications   Medication Sig Start Date End Date Taking? Authorizing Provider  acetaminophen (TYLENOL) 500 MG tablet Take 500 mg by mouth every 6 (six) hours as needed for mild pain, moderate pain or headache.   Yes Historical Provider, MD  aspirin 81 MG chewable tablet Chew 81 mg by mouth daily.   Yes Historical Provider, MD  buPROPion (WELLBUTRIN XL) 150 MG 24 hr tablet Take 3 tablets (450 mg total) by mouth every morning. 01/30/15  Yes Norma Fredrickson, MD  carvedilol (COREG) 25 MG tablet TAKE 1 TABLET BY MOUTH TWICE A DAY WITH MEALS 03/10/15  Yes Jerline Pain, MD  dicyclomine (BENTYL) 20 MG tablet Take 1 tablet (20 mg total) by mouth 2 (two) times daily. 10/28/14  Yes Joe Truitt Leep, PA-C   fexofenadine (ALLEGRA) 180 MG tablet Take 180 mg by mouth daily.   Yes Historical Provider, MD  gabapentin (NEURONTIN) 100 MG capsule Take 100 mg by mouth daily as needed (headache).    Yes Historical Provider, MD  gabapentin (NEURONTIN) 300 MG capsule Take 600 mg by mouth at bedtime.   Yes Historical Provider, MD  hydrochlorothiazide (MICROZIDE) 12.5 MG capsule Take 1 capsule (12.5 mg total) by mouth every morning. 05/16/14  Yes Jerline Pain, MD  mirabegron ER (MYRBETRIQ) 50 MG TB24 tablet Take 1 tablet (50 mg total) by mouth daily. 07/17/14  Yes Terrance Mass, MD  Omega-3 Fatty Acids (FISH OIL) 1000 MG CAPS Take 1 capsule by mouth 2 (two) times daily.    Yes Historical Provider, MD  sucralfate (CARAFATE) 1 G tablet Take 1 tablet (1 g total) by mouth 4 (four) times daily -  with meals and at bedtime. 09/08/14  Yes Dorie Rank, MD  Vitamin D, Ergocalciferol, (DRISDOL) 50000 UNITS CAPS capsule Take 1 capsule (50,000 Units total) by mouth every 7 (seven) days. 07/18/14  Yes Terrance Mass, MD  fluconazole (DIFLUCAN) 100 MG tablet Take 1 tablet (100 mg total) by mouth daily. Patient not taking: Reported on 09/04/2014 08/21/14   Milus Banister, MD  HYDROcodone-acetaminophen (NORCO/VICODIN) 5-325 MG per tablet Take 1-2 tablets by mouth every 4 (four) hours as needed. Patient not taking: Reported on 11/27/2014 10/28/14   Charlann Lange, PA-C  nystatin (MYCOSTATIN) 100000 UNIT/ML suspension Take 5 mLs (500,000 Units total) by mouth 4 (four) times daily. Patient not taking: Reported on 03/29/2015 12/30/14   Theodosia Quay, MD  ondansetron (ZOFRAN) 4 MG tablet Take 1 tablet (4 mg total) by mouth every 6 (six) hours as needed for nausea or vomiting. Patient not taking: Reported on 12/30/2014 11/27/14   Ernestina Patches, MD  oxyCODONE-acetaminophen (PERCOCET) 5-325 MG tablet Take 1 tablet by mouth every 6 (six) hours as needed. 03/29/15   Davonna Belling, MD  pantoprazole (PROTONIX) 40 MG tablet TAKE 1 TABLET (40 MG  TOTAL) BY MOUTH DAILY. 03/29/15   Davonna Belling, MD  promethazine (PHENERGAN) 25 MG tablet Take 1 tablet (25 mg total) by mouth every 6 (six) hours as needed for nausea. 03/29/15   Davonna Belling, MD   BP 136/86 mmHg  Pulse 87  Temp(Src) 97.9 F (36.6 C) (Oral)  Resp 18  Ht 5' 0.75" (1.543 m)  Wt 138 lb (62.596 kg)  BMI 26.29 kg/m2  SpO2 98% Physical Exam  Constitutional: She appears well-developed.  HENT:  Head: Atraumatic.  Neck: Neck supple.  Cardiovascular: Normal rate.   Pulmonary/Chest: Effort normal.  Abdominal: There is tenderness.  Upper abdominal tenderness. More on the midline. No mass.  No rebound or guarding.  Musculoskeletal: Normal range of motion.  Neurological: She is alert.  Skin: Skin is warm.    ED Course  Procedures (including critical care time) Labs Review Labs Reviewed  COMPREHENSIVE METABOLIC PANEL - Abnormal; Notable for the following:    CO2 19 (*)    Anion gap 19 (*)    All other components within normal limits  URINALYSIS, ROUTINE W REFLEX MICROSCOPIC (NOT AT [facility] - Abnormal; Notable for the following:    APPearance CLOUDY (*)    Hgb urine dipstick SMALL (*)    Ketones, ur 40 (*)    All other components within normal limits  URINE MICROSCOPIC-ADD ON - Abnormal; Notable for the following:    Squamous Epithelial / LPF 6-30 (*)    Bacteria, UA RARE (*)    All other components within normal limits  LIPASE, BLOOD  CBC  I-STAT BETA HCG BLOOD, ED ([facility], [facility], [facility] ONLY)    Imaging Review Ct Abdomen Pelvis W Contrast  03/29/2015  CLINICAL DATA:  Diffuse abdominal pain, nausea and vomiting, and diarrhea since yesterday. EXAM: CT ABDOMEN AND PELVIS WITH CONTRAST TECHNIQUE: Multidetector CT imaging of the abdomen and pelvis was performed using the standard protocol following bolus administration of intravenous contrast. CONTRAST:  143mL OMNIPAQUE IOHEXOL 300 MG/ML  SOLN COMPARISON:  08/04/2012 FINDINGS: Lower chest: No acute findings. Small left  posterior diaphragmatic hernia again seen containing only fat. Stable cardiomegaly and previous mitral valve replacement. Hepatobiliary: No masses or other significant abnormality. Prior cholecystectomy noted. No evidence of biliary dilatation. Pancreas: No mass, inflammatory changes, or other significant abnormality. Spleen: Within normal limits in size and appearance. Adrenals/Urinary Tract: No masses identified. No evidence of hydronephrosis. Stomach/Bowel: Small hiatal hernia again noted. No evidence of obstruction, inflammatory process, or abnormal fluid collections. Mild sigmoid diverticulosis is demonstrated, however there is no evidence of diverticulitis. Vascular/Lymphatic: No pathologically enlarged lymph nodes. No evidence of abdominal aortic aneurysm. Reproductive: No mass or other significant abnormality. Other: None. Musculoskeletal: No suspicious bone lesions identified. Old L1 vertebral body compression fracture and lumbar spine degenerative changes again noted. IMPRESSION: No acute findings within the abdomen or pelvis. Stable small hiatal hernia and left posterior diaphragmatic hernia. Colonic diverticulosis. No radiographic evidence of diverticulitis. Electronically Signed   By: Earle Gell M.D.   On: 03/29/2015 19:27   I have personally reviewed and evaluated these images and lab results as part of my medical decision-making.   EKG Interpretation   Date/Time:  Saturday March 29 2015 14:19:04 EST Ventricular Rate:  82 PR Interval:  151 QRS Duration: 83 QT Interval:  456 QTC Calculation: 533 R Axis:   74 Text Interpretation:  Sinus rhythm Low voltage, extremity leads Prolonged  QT interval Baseline wander in lead(s) I V2 Confirmed by Alvino Chapel  MD,  Ovid Curd [phone]) on 03/29/2015 3:14:05 PM      MDM   Final diagnoses:  Epigastric pain    Patient with epigastric pain. Has been going for the last week. CT scan reassuring. Labs reassuring and will discharge home.      Davonna Belling, MD 03/30/15 2325

## 2015-03-29 NOTE — ED Notes (Signed)
Awake. Verbally responsive. A/O x4. Resp even and unlabored. No audible adventitious breath sounds noted. ABC's intact. SR on monitor. IV saline lock patent and intact. Mother at bedside.

## 2015-03-29 NOTE — ED Notes (Signed)
Awake. Verbally responsive. A/O x4. Resp even and unlabored. No audible adventitious breath sounds noted. ABC's intact. SR on monitor. IV infusing NS without difficulty. Mother at bedside.

## 2015-03-29 NOTE — ED Notes (Signed)
Nurse starting iv will draw lab

## 2015-03-29 NOTE — ED Notes (Signed)
No lab draw in triage pt en route to room

## 2015-03-29 NOTE — ED Notes (Signed)
Pt states she is in severe pain and that dilaudid only works for her

## 2015-03-29 NOTE — ED Notes (Signed)
Patient is very [place]. Patient has a hearing aide, but mainly reads lips. Patient c/o mid abdominal pain, N/V/D since last night.

## 2015-03-29 NOTE — ED Notes (Signed)
Awake. Verbally responsive. A/O x4. Resp even and unlabored. No audible adventitious breath sounds noted. ABC's intact. SR on monitor. IV saline lock patent and intact.  Mother at bedside.

## 2015-03-29 NOTE — ED Notes (Signed)
Pt requesting something to eat and drink. Aware we are waiting for test results and the doctors approval.

## 2015-03-29 NOTE — Discharge Instructions (Signed)

## 2015-03-31 DIAGNOSIS — R51 Headache: Secondary | ICD-10-CM | POA: Diagnosis not present

## 2015-03-31 DIAGNOSIS — G43719 Chronic migraine without aura, intractable, without status migrainosus: Secondary | ICD-10-CM | POA: Diagnosis not present

## 2015-03-31 DIAGNOSIS — J329 Chronic sinusitis, unspecified: Secondary | ICD-10-CM | POA: Diagnosis not present

## 2015-03-31 DIAGNOSIS — M791 Myalgia: Secondary | ICD-10-CM | POA: Diagnosis not present

## 2015-03-31 DIAGNOSIS — G43111 Migraine with aura, intractable, with status migrainosus: Secondary | ICD-10-CM | POA: Diagnosis not present

## 2015-03-31 DIAGNOSIS — M542 Cervicalgia: Secondary | ICD-10-CM | POA: Diagnosis not present

## 2015-03-31 DIAGNOSIS — G518 Other disorders of facial nerve: Secondary | ICD-10-CM | POA: Diagnosis not present

## 2015-04-06 ENCOUNTER — Emergency Department (HOSPITAL_COMMUNITY)
Admission: EM | Admit: 2015-04-06 | Discharge: 2015-04-06 | Disposition: A | Discharge status: Home / Self Care | Payer: Medicare Other | Attending: Emergency Medicine | Admitting: Emergency Medicine

## 2015-04-06 ENCOUNTER — Encounter (HOSPITAL_COMMUNITY): Payer: Self-pay | Admitting: Emergency Medicine

## 2015-04-06 DIAGNOSIS — F10129 Alcohol abuse with intoxication, unspecified: Secondary | ICD-10-CM | POA: Diagnosis present

## 2015-04-06 DIAGNOSIS — Z862 Personal history of diseases of the blood and blood-forming organs and certain disorders involving the immune mechanism: Secondary | ICD-10-CM | POA: Diagnosis not present

## 2015-04-06 DIAGNOSIS — I951 Orthostatic hypotension: Secondary | ICD-10-CM

## 2015-04-06 DIAGNOSIS — M810 Age-related osteoporosis without current pathological fracture: Secondary | ICD-10-CM | POA: Diagnosis not present

## 2015-04-06 DIAGNOSIS — Z7982 Long term (current) use of aspirin: Secondary | ICD-10-CM | POA: Insufficient documentation

## 2015-04-06 DIAGNOSIS — R1013 Epigastric pain: Secondary | ICD-10-CM | POA: Diagnosis not present

## 2015-04-06 DIAGNOSIS — E559 Vitamin D deficiency, unspecified: Secondary | ICD-10-CM | POA: Insufficient documentation

## 2015-04-06 DIAGNOSIS — I10 Essential (primary) hypertension: Secondary | ICD-10-CM | POA: Insufficient documentation

## 2015-04-06 DIAGNOSIS — M797 Fibromyalgia: Secondary | ICD-10-CM | POA: Insufficient documentation

## 2015-04-06 DIAGNOSIS — F419 Anxiety disorder, unspecified: Secondary | ICD-10-CM | POA: Diagnosis not present

## 2015-04-06 DIAGNOSIS — F10120 Alcohol abuse with intoxication, uncomplicated: Secondary | ICD-10-CM | POA: Insufficient documentation

## 2015-04-06 DIAGNOSIS — G43909 Migraine, unspecified, not intractable, without status migrainosus: Secondary | ICD-10-CM | POA: Insufficient documentation

## 2015-04-06 DIAGNOSIS — F329 Major depressive disorder, single episode, unspecified: Secondary | ICD-10-CM | POA: Diagnosis not present

## 2015-04-06 DIAGNOSIS — K219 Gastro-esophageal reflux disease without esophagitis: Secondary | ICD-10-CM | POA: Diagnosis not present

## 2015-04-06 DIAGNOSIS — Z79899 Other long term (current) drug therapy: Secondary | ICD-10-CM | POA: Diagnosis not present

## 2015-04-06 DIAGNOSIS — F10920 Alcohol use, unspecified with intoxication, uncomplicated: Secondary | ICD-10-CM

## 2015-04-06 DIAGNOSIS — H9190 Unspecified hearing loss, unspecified ear: Secondary | ICD-10-CM | POA: Diagnosis not present

## 2015-04-06 DIAGNOSIS — Z86711 Personal history of pulmonary embolism: Secondary | ICD-10-CM | POA: Diagnosis not present

## 2015-04-06 LAB — COMPREHENSIVE METABOLIC PANEL
ALT: 15 U/L (ref 14–54)
AST: 18 U/L (ref 15–41)
Albumin: 3.9 g/dL (ref 3.5–5.0)
Alkaline Phosphatase: 81 U/L (ref 38–126)
Anion gap: 13 (ref 5–15)
BILIRUBIN TOTAL: 0.7 mg/dL (ref 0.3–1.2)
BUN: 20 mg/dL (ref 6–20)
CHLORIDE: 104 mmol/L (ref 101–111)
CO2: 24 mmol/L (ref 22–32)
Calcium: 8.4 mg/dL — ABNORMAL LOW (ref 8.9–10.3)
Creatinine, Ser: 0.66 mg/dL (ref 0.44–1.00)
Glucose, Bld: 73 mg/dL (ref 65–99)
Potassium: 3.7 mmol/L (ref 3.5–5.1)
Sodium: 141 mmol/L (ref 135–145)
TOTAL PROTEIN: 7.1 g/dL (ref 6.5–8.1)

## 2015-04-06 LAB — CBC WITH DIFFERENTIAL/PLATELET
Basophils Absolute: 0 10*3/uL (ref 0.0–0.1)
Basophils Relative: 0 %
EOS PCT: 2 %
Eosinophils Absolute: 0.1 10*3/uL (ref 0.0–0.7)
HEMATOCRIT: 38.8 % (ref 36.0–46.0)
Hemoglobin: 12.8 g/dL (ref 12.0–15.0)
LYMPHS ABS: 0.6 10*3/uL — AB (ref 0.7–4.0)
LYMPHS PCT: 10 %
MCH: 32.6 pg (ref 26.0–34.0)
MCHC: 33 g/dL (ref 30.0–36.0)
MCV: 98.7 fL (ref 78.0–100.0)
MONO ABS: 0.3 10*3/uL (ref 0.1–1.0)
Monocytes Relative: 6 %
NEUTROS ABS: 4.3 10*3/uL (ref 1.7–7.7)
Neutrophils Relative %: 82 %
PLATELETS: 186 10*3/uL (ref 150–400)
RBC: 3.93 MIL/uL (ref 3.87–5.11)
RDW: 13.9 % (ref 11.5–15.5)
WBC: 5.3 10*3/uL (ref 4.0–10.5)

## 2015-04-06 LAB — LIPASE, BLOOD: LIPASE: 32 U/L (ref 11–51)

## 2015-04-06 LAB — ETHANOL: Alcohol, Ethyl (B): 61 mg/dL — ABNORMAL HIGH (ref <5)

## 2015-04-06 MED ORDER — GI COCKTAIL ~~LOC~~
30.0000 mL | Freq: Once | ORAL | Status: AC
  Administered 2015-04-06: 30 mL via ORAL
  Filled 2015-04-06: qty 30

## 2015-04-06 MED ORDER — SODIUM CHLORIDE 0.9 % IV SOLN: 1000.0000 mL | INTRAVENOUS | Status: DC

## 2015-04-06 MED ORDER — PROMETHAZINE HCL 25 MG PO TABS: 25.0000 mg | ORAL_TABLET | Freq: Four times a day (QID) | ORAL | Status: DC | PRN

## 2015-04-06 MED ORDER — SODIUM CHLORIDE 0.9 % IV SOLN
1000.0000 mL | Freq: Once | INTRAVENOUS | Status: AC
  Administered 2015-04-06: 1000 mL via INTRAVENOUS

## 2015-04-06 MED ORDER — ONDANSETRON HCL 4 MG/2ML IJ SOLN
4.0000 mg | Freq: Once | INTRAMUSCULAR | Status: AC
  Administered 2015-04-06: 4 mg via INTRAVENOUS
  Filled 2015-04-06: qty 2

## 2015-04-06 NOTE — ED Notes (Signed)
Pt is deaf.  Can understand by reading lips.  States that she was alone last night and drank a bunch of vodka.  States that she passed out last night and woke up this morning.  Positive orthostatic changes.  Near syncope when standing.

## 2015-04-06 NOTE — ED Provider Notes (Signed)
CSN: [id number]     Arrival date & time 04/06/15  [id number] History   First MD Initiated Contact with Patient 04/06/15 [phone]     Chief Complaint  Patient presents with  . Alcohol Intoxication  . Hypotension     (Consider location/radiation/quality/duration/timing/severity/associated sxs/prior Treatment) HPI   54 year old female who is deaf, history of fibromyalgia, depression, anxiety, sleep apnea who presents with concerns of alcohol poisoning. Patient states she normally does not drink alcohol. She did have some vodka that she had a bought for Thanksgiving that was available at home.  Last night when she was alone at home because the daughter and son has left, she decided to drink vodka leisurely.  She believes she may have drank too much and has passed out throughout the night. This morning she woke up on the floor, feeling drunk, dehydrated, having abdominal cramping, nausea and vomiting. She believes she may have experienced alcohol poisoning. She is here requesting for symptomatic treatment. Denies history of alcohol abuse. She denies any homicidal or suicidal ideation or hallucination. She denies being depressed. She denies using any other product aside from alcohol. Currently she feels lightheadedness. Prior history of heart surgery for valvular repair and atrial septal defect.   Past Medical History  Diagnosis Date  . Depression   . Migraine   . Fibromyalgia   . Hearing impairment   . Arthritis   . Cardiomyopathy   . Pulmonary embolism ([place])   . Gastritis   . Osteoporosis   . Anemia   . Allergy     SEASONAL  . Anxiety   . GERD (gastroesophageal reflux disease)   . Hypertension     [name], take htn medication to regulate heart beat.  . Sleep apnea   . Vitamin D deficiency    Past Surgical History  Procedure Laterality Date  . Appendectomy    . Inner ear surgery      TUBES  . Tonsillectomy and adenoidectomy    . Tubal ligation    . Laparoscopic cholecystectomy  2012  .  Cardiac surgery  January 2007    mitral valve repair  . Cardiac surgery  1973    atrial septal defect  . Cochlear implant     Family History  Problem Relation Age of Onset  . Breast cancer Mother     bilateral; ages 35 and 47; TAH/BSO ~50  . Depression Sister   . Heart disease Father   . Hypertension Father   . Colon cancer Neg Hx   . Esophageal cancer Neg Hx   . Pancreatic cancer Neg Hx   . Rectal cancer Neg Hx   . Stomach cancer Neg Hx    Social History  Substance Use Topics  . Smoking status: Never Smoker   . Smokeless tobacco: Never Used  . Alcohol Use: No   OB History    Gravida Para Term Preterm AB TAB SAB Ectopic Multiple Living   2 2 2       2      Review of Systems  All other systems reviewed and are negative.     Allergies  Depakote and Valium  Home Medications   Prior to Admission medications   Medication Sig Start Date End Date Taking? Authorizing Provider  acetaminophen (TYLENOL) 500 MG tablet Take 500 mg by mouth every 6 (six) hours as needed for mild pain, moderate pain or headache.    Historical Provider, MD  aspirin 81 MG chewable tablet Chew 81 mg by mouth daily.  Historical Provider, MD  buPROPion (WELLBUTRIN XL) 150 MG 24 hr tablet Take 3 tablets (450 mg total) by mouth every morning. 01/30/15   Norma Fredrickson, MD  carvedilol (COREG) 25 MG tablet TAKE 1 TABLET BY MOUTH TWICE A DAY WITH MEALS 03/10/15   Jerline Pain, MD  dicyclomine (BENTYL) 20 MG tablet Take 1 tablet (20 mg total) by mouth 2 (two) times daily. 10/28/14   Dahlia Bailiff, PA-C  fexofenadine (ALLEGRA) 180 MG tablet Take 180 mg by mouth daily.    Historical Provider, MD  fluconazole (DIFLUCAN) 100 MG tablet Take 1 tablet (100 mg total) by mouth daily. Patient not taking: Reported on 09/04/2014 08/21/14   Milus Banister, MD  gabapentin (NEURONTIN) 100 MG capsule Take 100 mg by mouth daily as needed (headache).     Historical Provider, MD  gabapentin (NEURONTIN) 300 MG capsule Take 600 mg  by mouth at bedtime.    Historical Provider, MD  hydrochlorothiazide (MICROZIDE) 12.5 MG capsule Take 1 capsule (12.5 mg total) by mouth every morning. 05/16/14   Jerline Pain, MD  HYDROcodone-acetaminophen (NORCO/VICODIN) 5-325 MG per tablet Take 1-2 tablets by mouth every 4 (four) hours as needed. Patient not taking: Reported on 11/27/2014 10/28/14   Charlann Lange, PA-C  mirabegron ER (MYRBETRIQ) 50 MG TB24 tablet Take 1 tablet (50 mg total) by mouth daily. 07/17/14   Terrance Mass, MD  nystatin (MYCOSTATIN) 100000 UNIT/ML suspension Take 5 mLs (500,000 Units total) by mouth 4 (four) times daily. Patient not taking: Reported on 03/29/2015 12/30/14   Theodosia Quay, MD  Omega-3 Fatty Acids (FISH OIL) 1000 MG CAPS Take 1 capsule by mouth 2 (two) times daily.     Historical Provider, MD  ondansetron (ZOFRAN) 4 MG tablet Take 1 tablet (4 mg total) by mouth every 6 (six) hours as needed for nausea or vomiting. Patient not taking: Reported on 12/30/2014 11/27/14   Ernestina Patches, MD  oxyCODONE-acetaminophen (PERCOCET) 5-325 MG tablet Take 1 tablet by mouth every 6 (six) hours as needed. 03/29/15   Davonna Belling, MD  pantoprazole (PROTONIX) 40 MG tablet TAKE 1 TABLET (40 MG TOTAL) BY MOUTH DAILY. 03/29/15   Davonna Belling, MD  promethazine (PHENERGAN) 25 MG tablet Take 1 tablet (25 mg total) by mouth every 6 (six) hours as needed for nausea. 03/29/15   Davonna Belling, MD  sucralfate (CARAFATE) 1 G tablet Take 1 tablet (1 g total) by mouth 4 (four) times daily -  with meals and at bedtime. 09/08/14   Dorie Rank, MD  Vitamin D, Ergocalciferol, (DRISDOL) 50000 UNITS CAPS capsule Take 1 capsule (50,000 Units total) by mouth every 7 (seven) days. 07/18/14   Terrance Mass, MD   BP 92/61 mmHg  Pulse 75  Temp(Src) 97.8 F (36.6 C) (Oral)  Resp 16  SpO2 100% Physical Exam  Constitutional: She appears well-developed and well-nourished. No distress.  Caucasian female, hard of hearing  HENT:  Head:  Atraumatic.  Mouth/Throat: Oropharynx is clear and moist.  Eyes: Conjunctivae are normal.  Neck: Neck supple.  No cervical midline spine tenderness. No nuchal rigidity  Cardiovascular: Normal rate and regular rhythm.   Pulmonary/Chest: Effort normal and breath sounds normal. No respiratory distress.  Abdominal: Soft. There is tenderness (Mild epigastric tenderness without guarding or rebound tenderness).  Neurological: She is alert. GCS eye subscore is 4. GCS verbal subscore is 5. GCS motor subscore is 6.  Moving all four extremities without difficulty.  Skin: No rash noted.  Psychiatric: She has  a normal mood and affect.  Nursing note and vitals reviewed.   ED Course  Procedures (including critical care time)  Patient presents with symptoms concerning for alcohol poisoning.  Will treat sxs.  No concerning psychiatric condition at this time.    12:01 PM After receiving IV fluid, blood pressure improves, orthostatic hypotension resolved. Patient is now able to ambulate.  Labs Review Labs Reviewed  CBC WITH DIFFERENTIAL/PLATELET - Abnormal; Notable for the following:    Lymphs Abs 0.6 (*)    All other components within normal limits  COMPREHENSIVE METABOLIC PANEL - Abnormal; Notable for the following:    Calcium 8.4 (*)    All other components within normal limits  ETHANOL - Abnormal; Notable for the following:    Alcohol, Ethyl (B) 61 (*)    All other components within normal limits  LIPASE, BLOOD    Imaging Review No results found. I have personally reviewed and evaluated these images and lab results as part of my medical decision-making.   EKG Interpretation   Date/Time:  Sunday April 06 2015 09:33:47 EST Ventricular Rate:  75 PR Interval:  186 QRS Duration: 82 QT Interval:  457 QTC Calculation: 510 R Axis:   60 Text Interpretation:  Sinus rhythm Low voltage, precordial leads Prolonged  QT interval Baseline wander in lead(s) I II III aVR aVF no significant   change since Mar 29 2015 Confirmed by Regenia Skeeter  MD, SCOTT [phone]) on  04/06/2015 10:59:53 AM      MDM   Final diagnoses:  Alcohol intoxication, uncomplicated (HCC)  Orthostatic hypotension    BP 142/92 mmHg  Pulse 79  Temp(Src) 98 F (36.7 C) (Oral)  Resp 18  SpO2 100%     Domenic Moras, PA-C 04/06/15 [place], MD 04/07/15 1530

## 2015-04-06 NOTE — Discharge Instructions (Signed)
Alcohol Intoxication Alcohol intoxication occurs when you drink enough alcohol that it affects your ability to function. It can be mild or very severe. Drinking a lot of alcohol in a short time is called binge drinking. This can be very harmful. Drinking alcohol can also be more dangerous if you are taking medicines or other drugs. Some of the effects caused by alcohol may include:  Loss of coordination.  Changes in mood and behavior.  Unclear thinking.  Trouble talking (slurred speech).  Throwing up (vomiting).  Confusion.  Slowed breathing.  Twitching and shaking (seizures).  Loss of consciousness. HOME CARE  Do not drive after drinking alcohol.  Drink enough water and fluids to keep your pee (urine) clear or pale yellow. Avoid caffeine.  Only take medicine as told by your doctor. GET HELP IF:  You throw up (vomit) many times.  You do not feel better after a few days.  You frequently have alcohol intoxication. Your doctor can help decide if you should see a substance use treatment counselor. GET HELP RIGHT AWAY IF:  You become shaky when you stop drinking.  You have twitching and shaking.  You throw up blood. It may look bright red or like coffee grounds.  You notice blood in your poop (bowel movements).  You become lightheaded or pass out (faint). MAKE SURE YOU:   Understand these instructions.  Will watch your condition.  Will get help right away if you are not doing well or get worse.   This information is not intended to replace advice given to you by your health care provider. Make sure you discuss any questions you have with your health care provider.   Document Released: 10/13/2007 Document Revised: 12/27/2012 Document Reviewed: 09/29/2012 Elsevier Interactive Patient Education 2016 [employer].  Near-Syncope Near-syncope (commonly known as near fainting) is sudden weakness, dizziness, or feeling like you might pass out. This can happen when  getting up or while standing for a long time. It is caused by a sudden decrease in blood flow to the brain, which can occur for various reasons. Most of the reasons are not serious.  HOME CARE Watch your condition for any changes.  Have someone stay with you until you feel stable.  If you feel like you are going to pass out:  Lie down right away.  Prop your feet up if you can.  Breathe deeply and steadily.  Move only when the feeling has gone away. Most of the time, this feeling lasts only a few minutes. You may feel tired for several hours.  Drink enough fluids to keep your pee (urine) clear or pale yellow.  If you are taking blood pressure or heart medicine, stand up slowly.  Follow up with your doctor as told. GET HELP RIGHT AWAY IF:   You have a severe headache.  You have unusual pain in the chest, belly (abdomen), or back.  You have bleeding from the mouth or butt (rectum), or you have black or tarry poop (stool).  You feel your heart beat differently than normal, or you have a very fast pulse.  You pass out, or you twitch and shake when you pass out.  You pass out when sitting or lying down.  You feel confused.  You have trouble walking.  You are weak.  You have vision problems. MAKE SURE YOU:   Understand these instructions.  Will watch your condition.  Will get help right away if you are not doing well or get worse.  This information is not intended to replace advice given to you by your health care provider. Make sure you discuss any questions you have with your health care provider.   Document Released: 10/13/2007 Document Revised: 05/17/2014 Document Reviewed: 09/29/2012 Elsevier Interactive Patient Education [employer].

## 2015-04-07 DIAGNOSIS — M25512 Pain in left shoulder: Secondary | ICD-10-CM | POA: Diagnosis not present

## 2015-04-07 DIAGNOSIS — G894 Chronic pain syndrome: Secondary | ICD-10-CM | POA: Diagnosis not present

## 2015-04-07 DIAGNOSIS — H905 Unspecified sensorineural hearing loss: Secondary | ICD-10-CM | POA: Diagnosis not present

## 2015-04-07 DIAGNOSIS — M2550 Pain in unspecified joint: Secondary | ICD-10-CM | POA: Diagnosis not present

## 2015-04-07 DIAGNOSIS — B3781 Candidal esophagitis: Secondary | ICD-10-CM | POA: Diagnosis not present

## 2015-04-07 DIAGNOSIS — M810 Age-related osteoporosis without current pathological fracture: Secondary | ICD-10-CM | POA: Diagnosis not present

## 2015-04-07 DIAGNOSIS — R768 Other specified abnormal immunological findings in serum: Secondary | ICD-10-CM | POA: Diagnosis not present

## 2015-04-09 DIAGNOSIS — Z45321 Encounter for adjustment and management of cochlear device: Secondary | ICD-10-CM | POA: Diagnosis not present

## 2015-04-09 DIAGNOSIS — H903 Sensorineural hearing loss, bilateral: Secondary | ICD-10-CM | POA: Diagnosis not present

## 2015-04-11 ENCOUNTER — Emergency Department (HOSPITAL_COMMUNITY)
Admission: EM | Admit: 2015-04-11 | Discharge: 2015-04-11 | Disposition: A | Discharge status: Home / Self Care | Payer: Medicare Other | Attending: Emergency Medicine | Admitting: Emergency Medicine

## 2015-04-11 ENCOUNTER — Encounter (HOSPITAL_COMMUNITY): Payer: Self-pay

## 2015-04-11 DIAGNOSIS — Z792 Long term (current) use of antibiotics: Secondary | ICD-10-CM | POA: Insufficient documentation

## 2015-04-11 DIAGNOSIS — R1012 Left upper quadrant pain: Secondary | ICD-10-CM | POA: Diagnosis not present

## 2015-04-11 DIAGNOSIS — M1990 Unspecified osteoarthritis, unspecified site: Secondary | ICD-10-CM | POA: Insufficient documentation

## 2015-04-11 DIAGNOSIS — Z862 Personal history of diseases of the blood and blood-forming organs and certain disorders involving the immune mechanism: Secondary | ICD-10-CM | POA: Insufficient documentation

## 2015-04-11 DIAGNOSIS — Z7982 Long term (current) use of aspirin: Secondary | ICD-10-CM | POA: Insufficient documentation

## 2015-04-11 DIAGNOSIS — R112 Nausea with vomiting, unspecified: Secondary | ICD-10-CM

## 2015-04-11 DIAGNOSIS — M810 Age-related osteoporosis without current pathological fracture: Secondary | ICD-10-CM | POA: Diagnosis not present

## 2015-04-11 DIAGNOSIS — I10 Essential (primary) hypertension: Secondary | ICD-10-CM | POA: Insufficient documentation

## 2015-04-11 DIAGNOSIS — G43909 Migraine, unspecified, not intractable, without status migrainosus: Secondary | ICD-10-CM | POA: Diagnosis not present

## 2015-04-11 DIAGNOSIS — K219 Gastro-esophageal reflux disease without esophagitis: Secondary | ICD-10-CM | POA: Diagnosis not present

## 2015-04-11 DIAGNOSIS — Z79899 Other long term (current) drug therapy: Secondary | ICD-10-CM | POA: Insufficient documentation

## 2015-04-11 DIAGNOSIS — F419 Anxiety disorder, unspecified: Secondary | ICD-10-CM | POA: Insufficient documentation

## 2015-04-11 DIAGNOSIS — F1310 Sedative, hypnotic or anxiolytic abuse, uncomplicated: Secondary | ICD-10-CM | POA: Insufficient documentation

## 2015-04-11 DIAGNOSIS — F1010 Alcohol abuse, uncomplicated: Secondary | ICD-10-CM | POA: Diagnosis not present

## 2015-04-11 DIAGNOSIS — H9190 Unspecified hearing loss, unspecified ear: Secondary | ICD-10-CM | POA: Insufficient documentation

## 2015-04-11 DIAGNOSIS — E559 Vitamin D deficiency, unspecified: Secondary | ICD-10-CM | POA: Insufficient documentation

## 2015-04-11 DIAGNOSIS — F329 Major depressive disorder, single episode, unspecified: Secondary | ICD-10-CM | POA: Insufficient documentation

## 2015-04-11 DIAGNOSIS — Z86711 Personal history of pulmonary embolism: Secondary | ICD-10-CM | POA: Diagnosis not present

## 2015-04-11 DIAGNOSIS — F32A Depression, unspecified: Secondary | ICD-10-CM

## 2015-04-11 LAB — URINALYSIS, ROUTINE W REFLEX MICROSCOPIC
Glucose, UA: NEGATIVE mg/dL
LEUKOCYTES UA: NEGATIVE
NITRITE: NEGATIVE
PH: 6 (ref 5.0–8.0)
PROTEIN: 30 mg/dL — AB
Specific Gravity, Urine: 1.025 (ref 1.005–1.030)

## 2015-04-11 LAB — URINE MICROSCOPIC-ADD ON

## 2015-04-11 LAB — COMPREHENSIVE METABOLIC PANEL
ALK PHOS: 88 U/L (ref 38–126)
ALT: 31 U/L (ref 14–54)
AST: 45 U/L — AB (ref 15–41)
Albumin: 4.9 g/dL (ref 3.5–5.0)
Anion gap: 17 — ABNORMAL HIGH (ref 5–15)
BUN: 13 mg/dL (ref 6–20)
CALCIUM: 9.3 mg/dL (ref 8.9–10.3)
CHLORIDE: 97 mmol/L — AB (ref 101–111)
CO2: 26 mmol/L (ref 22–32)
CREATININE: 0.86 mg/dL (ref 0.44–1.00)
Glucose, Bld: 78 mg/dL (ref 65–99)
Potassium: 4.1 mmol/L (ref 3.5–5.1)
Sodium: 140 mmol/L (ref 135–145)
TOTAL PROTEIN: 8.1 g/dL (ref 6.5–8.1)
Total Bilirubin: 1.2 mg/dL (ref 0.3–1.2)

## 2015-04-11 LAB — CBC WITH DIFFERENTIAL/PLATELET
BASOS PCT: 0 %
Basophils Absolute: 0 10*3/uL (ref 0.0–0.1)
Eosinophils Absolute: 0 10*3/uL (ref 0.0–0.7)
Eosinophils Relative: 1 %
HEMATOCRIT: 40.9 % (ref 36.0–46.0)
Hemoglobin: 13.8 g/dL (ref 12.0–15.0)
LYMPHS PCT: 12 %
Lymphs Abs: 0.7 10*3/uL (ref 0.7–4.0)
MCH: 32.6 pg (ref 26.0–34.0)
MCHC: 33.7 g/dL (ref 30.0–36.0)
MCV: 96.7 fL (ref 78.0–100.0)
MONO ABS: 0.4 10*3/uL (ref 0.1–1.0)
MONOS PCT: 8 %
NEUTROS ABS: 4.4 10*3/uL (ref 1.7–7.7)
Neutrophils Relative %: 79 %
Platelets: 216 10*3/uL (ref 150–400)
RBC: 4.23 MIL/uL (ref 3.87–5.11)
RDW: 14.3 % (ref 11.5–15.5)
WBC: 5.5 10*3/uL (ref 4.0–10.5)

## 2015-04-11 LAB — RAPID URINE DRUG SCREEN, HOSP PERFORMED
Amphetamines: NOT DETECTED
BARBITURATES: POSITIVE — AB
BENZODIAZEPINES: NOT DETECTED
COCAINE: NOT DETECTED
OPIATES: NOT DETECTED
Tetrahydrocannabinol: NOT DETECTED

## 2015-04-11 LAB — LIPASE, BLOOD: Lipase: 53 U/L — ABNORMAL HIGH (ref 11–51)

## 2015-04-11 LAB — ETHANOL: Alcohol, Ethyl (B): 67 mg/dL — ABNORMAL HIGH (ref <5)

## 2015-04-11 MED ORDER — IBUPROFEN 200 MG PO TABS: 600.0000 mg | ORAL_TABLET | Freq: Three times a day (TID) | ORAL | Status: DC | PRN

## 2015-04-11 MED ORDER — ALUM & MAG HYDROXIDE-SIMETH 200-200-20 MG/5ML PO SUSP: 30.0000 mL | ORAL | Status: DC | PRN

## 2015-04-11 MED ORDER — GI COCKTAIL ~~LOC~~
30.0000 mL | Freq: Once | ORAL | Status: AC
  Administered 2015-04-11: 30 mL via ORAL
  Filled 2015-04-11: qty 30

## 2015-04-11 MED ORDER — ZOLPIDEM TARTRATE 5 MG PO TABS: 5.0000 mg | ORAL_TABLET | Freq: Every evening | ORAL | Status: DC | PRN

## 2015-04-11 MED ORDER — NICOTINE 21 MG/24HR TD PT24: 21.0000 mg | MEDICATED_PATCH | Freq: Every day | TRANSDERMAL | Status: DC

## 2015-04-11 MED ORDER — SODIUM CHLORIDE 0.9 % IV BOLUS (SEPSIS)
1000.0000 mL | Freq: Once | INTRAVENOUS | Status: AC
  Administered 2015-04-11: 1000 mL via INTRAVENOUS

## 2015-04-11 MED ORDER — ONDANSETRON 8 MG PO TBDP: 8.0000 mg | ORAL_TABLET | Freq: Three times a day (TID) | ORAL | Status: DC | PRN

## 2015-04-11 MED ORDER — HYDROXYZINE HCL 25 MG PO TABS
25.0000 mg | ORAL_TABLET | Freq: Once | ORAL | Status: AC
  Administered 2015-04-11: 25 mg via ORAL
  Filled 2015-04-11: qty 1

## 2015-04-11 MED ORDER — ONDANSETRON HCL 4 MG PO TABS: 4.0000 mg | ORAL_TABLET | Freq: Three times a day (TID) | ORAL | Status: DC | PRN

## 2015-04-11 MED ORDER — ONDANSETRON HCL 4 MG/2ML IJ SOLN
4.0000 mg | Freq: Once | INTRAMUSCULAR | Status: AC
  Administered 2015-04-11: 4 mg via INTRAVENOUS
  Filled 2015-04-11: qty 2

## 2015-04-11 MED ORDER — SODIUM CHLORIDE 0.9 % IV BOLUS (SEPSIS)
500.0000 mL | Freq: Once | INTRAVENOUS | Status: AC
  Administered 2015-04-11: 500 mL via INTRAVENOUS

## 2015-04-11 NOTE — ED Notes (Signed)
Patient has a history of depression and states that she has been drinking because Trump won. She denies SI/HI. Patient's mother is at the bedside and the patient is acting very child like while interacting with her mother. Patient has a psychiatrist but has not reached out for the recent depression and not taking her meds because she has been drinking. She denies physical complaints.

## 2015-04-11 NOTE — ED Notes (Signed)
Family at bedside. 

## 2015-04-11 NOTE — BH Assessment (Signed)
Assessment Note  [name] is an 54 y.o. female with history of depression and alcoholism. She presents to [facility] brought by her mother. Patient asked her mother to remain at bedside during the TTS assessment. Patient is hearing impaired. Sts that 1 week ago she relapsed on alcohol after 2 years of sobriety. Patient sts that the stress from the election lead to her relapse of alcohol. Sts, "I can't believe Trump won" and "I am afraid for my future with Trump in office". Patient started drinking alcohol in her 77's. She reports drinking daily for the past week. Sts, "I drink 1 small bottle of Vodka each day". Patient's last drink was yesterday. She reports tremors and NVD. She denies history of seizures. She has a history of black outs. Patient blacked out 2 days ago and sts, "My 30 y/o daughter had to get up and get herself ready for school b/c I was hung over". Patient has a history of #2 inpatient admissions at [facility] over 8 yrs ago for alcohol abuse. She denies history and/or current drug use. Patient denies SI's but reports passive thoughts of not wanting to wake up. Patient does not have access to means. She denies history of suicidal attempts/gestures. She denies previous suicidal attempts. Patient's outpatient psychiatrist is Dr. Marchelle Gearing at [facility]. She also has a therapist, Duwaine Maxin.   Diagnosis: Alcohol Abuse and Depressive Disorder   Past Medical History:  Past Medical History  Diagnosis Date  . Depression   . Migraine   . Fibromyalgia   . Hearing impairment   . Arthritis   . Cardiomyopathy   . Pulmonary embolism ([place])   . Gastritis   . Osteoporosis   . Anemia   . Allergy     SEASONAL  . Anxiety   . GERD (gastroesophageal reflux disease)   . Hypertension     [name], take htn medication to regulate heart beat.  . Sleep apnea   . Vitamin D deficiency     Past Surgical History  Procedure Laterality Date  . Appendectomy    . Inner ear surgery      TUBES  . Tonsillectomy and  adenoidectomy    . Tubal ligation    . Laparoscopic cholecystectomy  2012  . Cardiac surgery  January 2007    mitral valve repair  . Cardiac surgery  1973    atrial septal defect  . Cochlear implant      Family History:  Family History  Problem Relation Age of Onset  . Breast cancer Mother     bilateral; ages 5 and 51; TAH/BSO ~50  . Depression Sister   . Heart disease Father   . Hypertension Father   . Colon cancer Neg Hx   . Esophageal cancer Neg Hx   . Pancreatic cancer Neg Hx   . Rectal cancer Neg Hx   . Stomach cancer Neg Hx     Social History:  reports that she has never smoked. She has never used smokeless tobacco. She reports that she does not drink alcohol or use illicit drugs.  Additional Social History:  Alcohol / Drug Use Pain Medications: SEE MAR Prescriptions: SEE MAR Over the Counter: SEE MAR History of alcohol / drug use?: Yes Substance #1 Name of Substance 1: Alcohol  1 - Age of First Use: 40's 1 - Amount (size/oz): "1 small bottle of Vodka" 1 - Frequency: daily for the past week  1 - Duration: 1 week  1 - Last Use / Amount: 04/10/2015  CIWA: CIWA-Ar BP: 108/66 mmHg Pulse Rate: 84 Nausea and Vomiting: no nausea and no vomiting Tactile Disturbances: none Tremor: no tremor Auditory Disturbances: not present Paroxysmal Sweats: no sweat visible Visual Disturbances: not present Anxiety: three Headache, Fullness in Head: none present Agitation: normal activity Orientation and Clouding of Sensorium: oriented and can do serial additions CIWA-Ar Total: 3 COWS:    Allergies:  Allergies  Allergen Reactions  . Depakote [Divalproex Sodium] Anaphylaxis  . Valium Anaphylaxis    Home Medications:  (Not in a hospital admission)  OB/GYN Status:  No LMP recorded. Patient is postmenopausal.  General Assessment Data Location of Assessment: [facility] ED TTS Assessment: In system Is this a Tele or Face-to-Face Assessment?: Face-to-Face Is this an Initial  Assessment or a Re-assessment for this encounter?: Initial Assessment Marital status: Single Maiden name:  Hayenga ) Is patient pregnant?: No Pregnancy Status: No Living Arrangements: Children, Other (Comment) (61 yr old child in the home) Can pt return to current living arrangement?: Yes Admission Status: Voluntary Is patient capable of signing voluntary admission?: Yes Referral Source: Self/Family/Friend Insurance type:  (MCR/MCD)     Crisis Care Plan Living Arrangements: Children, Other (Comment) (23 yr old child in the home) Name of Psychiatrist:  (No psychiatrist ) Name of Therapist:  (No therapist )  Education Status Is patient currently in school?: No Current Grade:  (n/a) Highest grade of school patient has completed:  (n/a) Name of school:  (n/a) Contact person:  (n/a)  Risk to self with the past 6 months Suicidal Ideation: Yes-Currently Present Has patient been a risk to self within the past 6 months prior to admission? : No Suicidal Intent: No Has patient had any suicidal intent within the past 6 months prior to admission? : No Is patient at risk for suicide?: No Suicidal Plan?: No Has patient had any suicidal plan within the past 6 months prior to admission? : No Access to Means: No What has been your use of drugs/alcohol within the last 12 months?:  (relapsed on alcohol 1 week ago) Previous Attempts/Gestures: No How many times?:  (0) Other Self Harm Risks:  (none reported) Triggers for Past Attempts: Other (Comment) (no previous attempts) Intentional Self Injurious Behavior: None Family Suicide History: Yes (sister-depression) Persecutory voices/beliefs?: No Depression: Yes Depression Symptoms: Feeling angry/irritable, Loss of interest in usual pleasures, Feeling worthless/self pity, Guilt, Fatigue, Isolating, Tearfulness, Insomnia, Despondent Substance abuse history and/or treatment for substance abuse?: No Suicide prevention information given to  non-admitted patients: Not applicable  Risk to Others within the past 6 months Homicidal Ideation: No Does patient have any lifetime risk of violence toward others beyond the six months prior to admission? : No Thoughts of Harm to Others: No Current Homicidal Intent: No Current Homicidal Plan: No Access to Homicidal Means: No Identified Victim:  (n/a) History of harm to others?: No Assessment of Violence: None Noted Violent Behavior Description:  (patient is calm and cooperative ) Does patient have access to weapons?: No Criminal Charges Pending?: No Does patient have a court date: No Is patient on probation?: No  Psychosis Hallucinations: None noted Delusions: None noted  Mental Status Report Appearance/Hygiene: Disheveled Eye Contact: Fair Motor Activity: Freedom of movement Speech: Logical/coherent Level of Consciousness: Alert Mood: Depressed Affect: Appropriate to circumstance Anxiety Level: None Thought Processes: Coherent, Relevant, Irrelevant Judgement: Impaired Orientation: Person, Place, Time, Situation Obsessive Compulsive Thoughts/Behaviors: Minimal  Cognitive Functioning Concentration: Normal Memory: Recent Intact, Remote Intact IQ: Average Insight: Good Impulse Control: Fair Appetite: Fair ("My appetite  has been poor since drinking in the past week") Weight Loss:  (none reported) Weight Gain:  (none reported) Sleep: No Change Vegetative Symptoms: None  ADLScreening [facility]) Patient's cognitive ability adequate to safely complete daily activities?: Yes Patient able to express need for assistance with ADLs?: Yes Independently performs ADLs?: Yes (appropriate for developmental age)  Prior Inpatient Therapy Prior Inpatient Therapy: No Prior Therapy Dates:  (n/a) Prior Therapy Facilty/Provider(s):  (n/a) Reason for Treatment: n/a  Prior Outpatient Therapy Prior Outpatient Therapy: Yes Prior Therapy Dates:  (Dr. Marchelle Gearing ) Prior  Therapy Facilty/Provider(s):  (Dr. Charlett Nose; Alex Wilson-therapist ) Reason for Treatment:  (med managment and therapy) Does patient have an ACCT team?: No Does patient have Intensive In-House Services?  : No Does patient have [facility] services? : No Does patient have P4CC services?: No  ADL Screening (condition at time of admission) Patient's cognitive ability adequate to safely complete daily activities?: Yes Is the patient deaf or have difficulty hearing?: No Does the patient have difficulty seeing, even when wearing glasses/contacts?: No Does the patient have difficulty concentrating, remembering, or making decisions?: No Patient able to express need for assistance with ADLs?: Yes Does the patient have difficulty dressing or bathing?: No Independently performs ADLs?: Yes (appropriate for developmental age) Does the patient have difficulty walking or climbing stairs?: No Weakness of Legs: None Weakness of Arms/Hands: None  Home Assistive Devices/Equipment Home Assistive Devices/Equipment: None    Abuse/Neglect Assessment (Assessment to be complete while patient is alone) Physical Abuse: Denies Verbal Abuse: Denies Sexual Abuse: Denies Exploitation of patient/patient's resources: Denies Self-Neglect: Denies Values / Beliefs Cultural Requests During Hospitalization: None Spiritual Requests During Hospitalization: None   Advance Directives (For Healthcare) Does patient have an advance directive?: No Would patient like information on creating an advanced directive?: No - patient declined information    Additional Information 1:1 In Past 12 Months?: No CIRT Risk: No Elopement Risk: No Does patient have medical clearance?: Yes     Disposition:  Disposition Initial Assessment Completed for this Encounter: Yes Disposition of Patient: Treatment offered and refused (Patient offered Obs unit and CD-IOP but declined)  On Site Evaluation by:   Reviewed with  Physician:    Evangeline Gula 04/11/2015 2:57 PM

## 2015-04-11 NOTE — ED Notes (Signed)
TTS at bedside. 

## 2015-04-11 NOTE — ED Provider Notes (Signed)
CSN: [id number]     Arrival date & time 04/11/15  0844 History   First MD Initiated Contact with Patient 04/11/15 [phone]     Chief Complaint  Patient presents with  . Alcohol Problem  . Depression     (Consider location/radiation/quality/duration/timing/severity/associated sxs/prior Treatment) HPI [name] is a 54 y.o. female history of depression, PE, valve replacement, cardiomyopathy, presents to emergency department complaining of binge alcohol drinking, depression, dehydration. Patient states that she has had worsening depression over the last several days that she has been drinking large amount of alcohol daily. She states she has been drinking a full bottle of vodka a day. She reports some abdominal pain, nausea, vomiting, last 3 days. She states she was worried that she had alcohol poisoning since decided to come to emergency department. She is also stating that she feels like she wants to go to sleep and never wake up. She denies any particular suicidal plan. She also states that she does not think that she would hurt herself, but wishes that she "just wouldn't wake up." No hematoemesis. No diarrhea. No fever or chills. No abdominal pain.    Past Medical History  Diagnosis Date  . Depression   . Migraine   . Fibromyalgia   . Hearing impairment   . Arthritis   . Cardiomyopathy   . Pulmonary embolism ([place])   . Gastritis   . Osteoporosis   . Anemia   . Allergy     SEASONAL  . Anxiety   . GERD (gastroesophageal reflux disease)   . Hypertension     [name], take htn medication to regulate heart beat.  . Sleep apnea   . Vitamin D deficiency    Past Surgical History  Procedure Laterality Date  . Appendectomy    . Inner ear surgery      TUBES  . Tonsillectomy and adenoidectomy    . Tubal ligation    . Laparoscopic cholecystectomy  2012  . Cardiac surgery  January 2007    mitral valve repair  . Cardiac surgery  1973    atrial septal defect  . Cochlear implant      Family History  Problem Relation Age of Onset  . Breast cancer Mother     bilateral; ages 63 and 34; TAH/BSO ~50  . Depression Sister   . Heart disease Father   . Hypertension Father   . Colon cancer Neg Hx   . Esophageal cancer Neg Hx   . Pancreatic cancer Neg Hx   . Rectal cancer Neg Hx   . Stomach cancer Neg Hx    Social History  Substance Use Topics  . Smoking status: Never Smoker   . Smokeless tobacco: Never Used  . Alcohol Use: No   OB History    Gravida Para Term Preterm AB TAB SAB Ectopic Multiple Living   2 2 2       2      Review of Systems  Constitutional: Negative for fever and chills.  Respiratory: Negative for cough, chest tightness and shortness of breath.   Cardiovascular: Negative for chest pain, palpitations and leg swelling.  Gastrointestinal: Positive for nausea, vomiting and abdominal pain. Negative for diarrhea.  Genitourinary: Negative for dysuria and flank pain.  Musculoskeletal: Negative for myalgias, arthralgias, neck pain and neck stiffness.  Skin: Negative for rash.  Neurological: Negative for dizziness, weakness and headaches.  Psychiatric/Behavioral: The patient is nervous/anxious.        Positive for depression  All other systems  reviewed and are negative.     Allergies  Depakote and Valium  Home Medications   Prior to Admission medications   Medication Sig Start Date End Date Taking? Authorizing Provider  acetaminophen (TYLENOL) 500 MG tablet Take 500 mg by mouth every 6 (six) hours as needed for mild pain, moderate pain or headache.   Yes Historical Provider, MD  amoxicillin-clavulanate (AUGMENTIN) 500-125 MG tablet Take 1 tablet by mouth 2 (two) times daily.    Yes Historical Provider, MD  aspirin 81 MG chewable tablet Chew 81 mg by mouth daily.   Yes Historical Provider, MD  buPROPion (WELLBUTRIN XL) 150 MG 24 hr tablet Take 3 tablets (450 mg total) by mouth every morning. 01/30/15  Yes Norma Fredrickson, MD  carvedilol (COREG) 25  MG tablet TAKE 1 TABLET BY MOUTH TWICE A DAY WITH MEALS 03/10/15  Yes Jerline Pain, MD  fexofenadine (ALLEGRA) 180 MG tablet Take 180 mg by mouth daily.   Yes Historical Provider, MD  gabapentin (NEURONTIN) 100 MG capsule Take 100 mg by mouth daily as needed (headache).    Yes Historical Provider, MD  gabapentin (NEURONTIN) 300 MG capsule Take 600 mg by mouth at bedtime.   Yes Historical Provider, MD  hydrochlorothiazide (MICROZIDE) 12.5 MG capsule Take 1 capsule (12.5 mg total) by mouth every morning. 05/16/14  Yes Jerline Pain, MD  mirabegron ER (MYRBETRIQ) 50 MG TB24 tablet Take 1 tablet (50 mg total) by mouth daily. 07/17/14  Yes Terrance Mass, MD  Omega-3 Fatty Acids (FISH OIL) 1000 MG CAPS Take 1 capsule by mouth 2 (two) times daily.    Yes Historical Provider, MD  pantoprazole (PROTONIX) 40 MG tablet TAKE 1 TABLET (40 MG TOTAL) BY MOUTH DAILY. 03/29/15  Yes Davonna Belling, MD  dicyclomine (BENTYL) 20 MG tablet Take 1 tablet (20 mg total) by mouth 2 (two) times daily. Patient not taking: Reported on 04/11/2015 10/28/14   Dahlia Bailiff, PA-C  fluconazole (DIFLUCAN) 100 MG tablet Take 1 tablet (100 mg total) by mouth daily. Patient not taking: Reported on 09/04/2014 08/21/14   Milus Banister, MD  HYDROcodone-acetaminophen (NORCO/VICODIN) 5-325 MG per tablet Take 1-2 tablets by mouth every 4 (four) hours as needed. Patient not taking: Reported on 11/27/2014 10/28/14   Charlann Lange, PA-C  nystatin (MYCOSTATIN) 100000 UNIT/ML suspension Take 5 mLs (500,000 Units total) by mouth 4 (four) times daily. Patient not taking: Reported on 03/29/2015 12/30/14   Theodosia Quay, MD  ondansetron (ZOFRAN) 4 MG tablet Take 1 tablet (4 mg total) by mouth every 6 (six) hours as needed for nausea or vomiting. Patient not taking: Reported on 12/30/2014 11/27/14   Ernestina Patches, MD  oxyCODONE-acetaminophen (PERCOCET) 5-325 MG tablet Take 1 tablet by mouth every 6 (six) hours as needed. Patient not taking: Reported on  04/11/2015 03/29/15   Davonna Belling, MD  promethazine (PHENERGAN) 25 MG tablet Take 1 tablet (25 mg total) by mouth every 6 (six) hours as needed for nausea or vomiting. Patient not taking: Reported on 04/11/2015 04/06/15   Domenic Moras, PA-C  sucralfate (CARAFATE) 1 G tablet Take 1 tablet (1 g total) by mouth 4 (four) times daily -  with meals and at bedtime. Patient not taking: Reported on 04/06/2015 09/08/14   Dorie Rank, MD  Vitamin D, Ergocalciferol, (DRISDOL) 50000 UNITS CAPS capsule Take 1 capsule (50,000 Units total) by mouth every 7 (seven) days. Patient not taking: Reported on 04/11/2015 07/18/14   Terrance Mass, MD   BP 96/72 mmHg  Pulse 76  Temp(Src) 97.4 F (36.3 C) (Oral)  Resp 16  SpO2 99% Physical Exam  Constitutional: She is oriented to person, place, and time. She appears well-developed and well-nourished. No distress.  HENT:  Head: Normocephalic.  Eyes: Conjunctivae are normal.  Neck: Neck supple.  Cardiovascular: Normal rate, regular rhythm and normal heart sounds.   Pulmonary/Chest: Effort normal and breath sounds normal. No respiratory distress. She has no wheezes. She has no rales.  Abdominal: Soft. Bowel sounds are normal. She exhibits no distension. There is tenderness. There is no rebound.  Epigastric and left upper quadrant tenderness.   Musculoskeletal: She exhibits no edema.  Neurological: She is alert and oriented to person, place, and time.  Skin: Skin is warm and dry.  Psychiatric: She has a normal mood and affect. Her behavior is normal.  Nursing note and vitals reviewed.   ED Course  Procedures (including critical care time) Labs Review Labs Reviewed  COMPREHENSIVE METABOLIC PANEL - Abnormal; Notable for the following:    Chloride 97 (*)    AST 45 (*)    Anion gap 17 (*)    All other components within normal limits  LIPASE, BLOOD - Abnormal; Notable for the following:    Lipase 53 (*)    All other components within normal limits  URINALYSIS,  ROUTINE W REFLEX MICROSCOPIC (NOT AT [facility]) - Abnormal; Notable for the following:    Color, Urine AMBER (*)    APPearance CLOUDY (*)    Hgb urine dipstick MODERATE (*)    Bilirubin Urine MODERATE (*)    Ketones, ur >80 (*)    Protein, ur 30 (*)    All other components within normal limits  URINE RAPID DRUG SCREEN, HOSP PERFORMED - Abnormal; Notable for the following:    Barbiturates POSITIVE (*)    All other components within normal limits  ETHANOL - Abnormal; Notable for the following:    Alcohol, Ethyl (B) 67 (*)    All other components within normal limits  URINE MICROSCOPIC-ADD ON - Abnormal; Notable for the following:    Squamous Epithelial / LPF 6-30 (*)    Bacteria, UA RARE (*)    Casts HYALINE CASTS (*)    Crystals CA OXALATE CRYSTALS (*)    All other components within normal limits  CBC WITH DIFFERENTIAL/PLATELET    Imaging Review No results found. I have personally reviewed and evaluated these images and lab results as part of my medical decision-making.   EKG Interpretation   Date/Time:  Friday April 11 2015 09:12:38 EST Ventricular Rate:  75 PR Interval:  187 QRS Duration: 86 QT Interval:  475 QTC Calculation: 531 R Axis:   68 Text Interpretation:  Sinus rhythm Borderline low voltage, extremity leads  Prolonged QT interval Baseline wander in lead(s) I III aVL No significant  change since last tracing Confirmed by Maryan Rued  MD, Loree Fee ([id number]) on  04/11/2015 9:17:17 AM      MDM   Final diagnoses:  Depression  Alcohol abuse  Left upper quadrant pain  Non-intractable vomiting with nausea, vomiting of unspecified type   Pt with nausea, vomiting, abdominal pain, alcohol binge drinking. She reports worsening depression. Denies SI but states "wish i just wouldn't wake up." Will get medical screening and abdominal pain labs. Will get TTS assessment once medically cleared.   Pt's lipase mildly elevated. However pt feesl better. She is tolerating PO fluids.  She does appear to be anxious though. Question alcohol withdrawal? Patient is allergic to benzos  with reaction to Valium of anaphylaxis, this was reported by patient and her mother. I will try some Vistaril for anxiety.  3:35 PM Patient was assessed by TTS. She denies any suicidal ideations to them. She was able to sign for safety. She does not want inpatient treatment for her polysubstance abuse or depression. She wishes to be discharged home. At this time patient does not report any suicidal thoughts or ideations to me. She does want to go home. No active withdrawal symptoms. Patient is eating lunch, no distress. On discharge her home with Zofran for nausea. Return precautions discussed. I have also given her referrals for psychiatric and substance abuse treatment.  Filed Vitals:   04/11/15 1100 04/11/15 1229 04/11/15 1300 04/11/15 1445  BP: 107/81 114/82 117/77 108/66  Pulse: 78 79 82 84  Temp:      TempSrc:      Resp: 23 20 13 14   SpO2: 100% 96% 99% 97%     Jeannett Senior, PA-C 04/11/15 [place], MD 04/14/15 [phone]

## 2015-04-11 NOTE — Discharge Instructions (Signed)
zofran for nausea as prescribed as needed. Stop drinking alcohol. Follow up with primary care doctor and psychiatrist. Resource list attached.    Alcohol Use Disorder Alcohol use disorder is a mental disorder. It is not a one-time incident of heavy drinking. Alcohol use disorder is the excessive and uncontrollable use of alcohol over time that leads to problems with functioning in one or more areas of daily living. People with this disorder risk harming themselves and others when they drink to excess. Alcohol use disorder also can cause other mental disorders, such as mood and anxiety disorders, and serious physical problems. People with alcohol use disorder often misuse other drugs.  Alcohol use disorder is common and widespread. Some people with this disorder drink alcohol to cope with or escape from negative life events. Others drink to relieve chronic pain or symptoms of mental illness. People with a family history of alcohol use disorder are at higher risk of losing control and using alcohol to excess.  Drinking too much alcohol can cause injury, accidents, and health problems. One drink can be too much when you are:  Working.  Pregnant or breastfeeding.  Taking medicines. Ask your doctor.  Driving or planning to drive. SYMPTOMS  Signs and symptoms of alcohol use disorder may include the following:   Consumption ofalcohol inlarger amounts or over a longer period of time than intended.  Multiple unsuccessful attempts to cutdown or control alcohol use.   A great deal of time spent obtaining alcohol, using alcohol, or recovering from the effects of alcohol (hangover).  A strong desire or urge to use alcohol (cravings).   Continued use of alcohol despite problems at work, school, or home because of alcohol use.   Continued use of alcohol despite problems in relationships because of alcohol use.  Continued use of alcohol in situations when it is physically hazardous, such as  driving a car.  Continued use of alcohol despite awareness of a physical or psychological problem that is likely related to alcohol use. Physical problems related to alcohol use can involve the brain, heart, liver, stomach, and intestines. Psychological problems related to alcohol use include intoxication, depression, anxiety, psychosis, delirium, and dementia.   The need for increased amounts of alcohol to achieve the same desired effect, or a decreased effect from the consumption of the same amount of alcohol (tolerance).  Withdrawal symptoms upon reducing or stopping alcohol use, or alcohol use to reduce or avoid withdrawal symptoms. Withdrawal symptoms include:  Racing heart.  Hand tremor.  Difficulty sleeping.  Nausea.  Vomiting.  Hallucinations.  Restlessness.  Seizures. DIAGNOSIS Alcohol use disorder is diagnosed through an assessment by your health care provider. Your health care provider may start by asking three or four questions to screen for excessive or problematic alcohol use. To confirm a diagnosis of alcohol use disorder, at least two symptoms must be present within a 7-month period. The severity of alcohol use disorder depends on the number of symptoms:  Mild--two or three.  Moderate--four or five.  Severe--six or more. Your health care provider may perform a physical exam or use results from lab tests to see if you have physical problems resulting from alcohol use. Your health care provider may refer you to a mental health professional for evaluation. TREATMENT  Some people with alcohol use disorder are able to reduce their alcohol use to low-risk levels. Some people with alcohol use disorder need to quit drinking alcohol. When necessary, mental health professionals with specialized training in substance use treatment  can help. Your health care provider can help you decide how severe your alcohol use disorder is and what type of treatment you need. The following  forms of treatment are available:   Detoxification. Detoxification involves the use of prescription medicines to prevent alcohol withdrawal symptoms in the first week after quitting. This is important for people with a history of symptoms of withdrawal and for heavy drinkers who are likely to have withdrawal symptoms. Alcohol withdrawal can be dangerous and, in severe cases, cause death. Detoxification is usually provided in a hospital or in-patient substance use treatment facility.  Counseling or talk therapy. Talk therapy is provided by substance use treatment counselors. It addresses the reasons people use alcohol and ways to keep them from drinking again. The goals of talk therapy are to help people with alcohol use disorder find healthy activities and ways to cope with life stress, to identify and avoid triggers for alcohol use, and to handle cravings, which can cause relapse.  Medicines.Different medicines can help treat alcohol use disorder through the following actions:  Decrease alcohol cravings.  Decrease the positive reward response felt from alcohol use.  Produce an uncomfortable physical reaction when alcohol is used (aversion therapy).  Support groups. Support groups are run by people who have quit drinking. They provide emotional support, advice, and guidance. These forms of treatment are often combined. Some people with alcohol use disorder benefit from intensive combination treatment provided by specialized substance use treatment centers. Both inpatient and outpatient treatment programs are available.   This information is not intended to replace advice given to you by your health care provider. Make sure you discuss any questions you have with your health care provider.   Document Released: 06/03/2004 Document Revised: 05/17/2014 Document Reviewed: 08/03/2012 Elsevier Interactive Patient Education 2016 Elsevier Inc. Major Depressive Disorder Major depressive disorder is a  mental illness. It also may be called clinical depression or unipolar depression. Major depressive disorder usually causes feelings of sadness, hopelessness, or helplessness. Some people with this disorder do not feel particularly sad but lose interest in doing things they used to enjoy (anhedonia). Major depressive disorder also can cause physical symptoms. It can interfere with work, school, relationships, and other normal everyday activities. The disorder varies in severity but is longer lasting and more serious than the sadness we all feel from time to time in our lives. Major depressive disorder often is triggered by stressful life events or major life changes. Examples of these triggers include divorce, loss of your job or home, a move, and the death of a family member or close friend. Sometimes this disorder occurs for no obvious reason at all. People who have family members with major depressive disorder or bipolar disorder are at higher risk for developing this disorder, with or without life stressors. Major depressive disorder can occur at any age. It may occur just once in your life (single episode major depressive disorder). It may occur multiple times (recurrent major depressive disorder). SYMPTOMS People with major depressive disorder have either anhedonia or depressed mood on nearly a daily basis for at least 2 weeks or longer. Symptoms of depressed mood include:  Feelings of sadness (blue or down in the dumps) or emptiness.  Feelings of hopelessness or helplessness.  Tearfulness or episodes of crying (may be observed by others).  Irritability (children and adolescents). In addition to depressed mood or anhedonia or both, people with this disorder have at least four of the following symptoms:  Difficulty sleeping or sleeping too  much.   Significant change (increase or decrease) in appetite or weight.   Lack of energy or motivation.  Feelings of guilt and worthlessness.    Difficulty concentrating, remembering, or making decisions.  Unusually slow movement (psychomotor retardation) or restlessness (as observed by others).   Recurrent wishes for death, recurrent thoughts of self-harm (suicide), or a suicide attempt. People with major depressive disorder commonly have persistent negative thoughts about themselves, other people, and the world. People with severe major depressive disorder may experiencedistorted beliefs or perceptions about the world (psychotic delusions). They also may see or hear things that are not real (psychotic hallucinations). DIAGNOSIS Major depressive disorder is diagnosed through an assessment by your health care provider. Your health care provider will ask aboutaspects of your daily life, such as mood,sleep, and appetite, to see if you have the diagnostic symptoms of major depressive disorder. Your health care provider may ask about your medical history and use of alcohol or drugs, including prescription medicines. Your health care provider also may do a physical exam and blood work. This is because certain medical conditions and the use of certain substances can cause major depressive disorder-like symptoms (secondary depression). Your health care provider also may refer you to a mental health specialist for further evaluation and treatment. TREATMENT It is important to recognize the symptoms of major depressive disorder and seek treatment. The following treatments can be prescribed for this disorder:   Medicine. Antidepressant medicines usually are prescribed. Antidepressant medicines are thought to correct chemical imbalances in the brain that are commonly associated with major depressive disorder. Other types of medicine may be added if the symptoms do not respond to antidepressant medicines alone or if psychotic delusions or hallucinations occur.  Talk therapy. Talk therapy can be helpful in treating major depressive disorder by  providing support, education, and guidance. Certain types of talk therapy also can help with negative thinking (cognitive behavioral therapy) and with relationship issues that trigger this disorder (interpersonal therapy). A mental health specialist can help determine which treatment is best for you. Most people with major depressive disorder do well with a combination of medicine and talk therapy. Treatments involving electrical stimulation of the brain can be used in situations with extremely severe symptoms or when medicine and talk therapy do not work over time. These treatments include electroconvulsive therapy, transcranial magnetic stimulation, and vagal nerve stimulation.   This information is not intended to replace advice given to you by your health care provider. Make sure you discuss any questions you have with your health care provider.   Document Released: 08/21/2012 Document Revised: 05/17/2014 Document Reviewed: 08/21/2012 Elsevier Interactive Patient Education [employer].

## 2015-04-11 NOTE — ED Notes (Signed)
Pt states former drinking habit.  Has been drinking this past week d/t depression.  Has been vomiting with diarrhea today.  Normally didn't drink up until this week.  Hasn't been eating.

## 2015-04-11 NOTE — ED Notes (Signed)
ED PA at bedside

## 2015-04-16 ENCOUNTER — Emergency Department (HOSPITAL_COMMUNITY)
Admission: EM | Admit: 2015-04-16 | Discharge: 2015-04-16 | Discharge status: Against Medical Advice | Payer: Medicare Other | Attending: Emergency Medicine | Admitting: Emergency Medicine

## 2015-04-16 ENCOUNTER — Ambulatory Visit (HOSPITAL_COMMUNITY)
Admission: AD | Admit: 2015-04-16 | Discharge: 2015-04-16 | Disposition: A | Discharge status: Home / Self Care | Payer: Medicare Other | Attending: Psychiatry | Admitting: Psychiatry

## 2015-04-16 DIAGNOSIS — I10 Essential (primary) hypertension: Secondary | ICD-10-CM | POA: Diagnosis not present

## 2015-04-16 DIAGNOSIS — Z008 Encounter for other general examination: Secondary | ICD-10-CM | POA: Diagnosis not present

## 2015-04-16 DIAGNOSIS — F329 Major depressive disorder, single episode, unspecified: Secondary | ICD-10-CM | POA: Diagnosis not present

## 2015-04-16 NOTE — ED Notes (Signed)
Patient states she wants to leave and does not want to go to [facility] to be in the observation unit. Triage stopped.

## 2015-04-16 NOTE — BH Assessment (Addendum)
Assessment Note  [name] is an 54 y.o. female who presented to [facility] as a walk in.  Patient had presented to [facility] on 04/11/15 with alcohol use and depression.  It had been Recommended that patient go into the observation unit but she chose to go home instead. Today patient presented to [facility] and stated that her therapist had sent her back to detox From alcohol. She reported that she had been taking psychotropic medications to help With her depression but she had not been "keeping up" with her medications.  Patient reported sadness and stated "I wish I didn't exist".  She did not have a plan but stated "I wish I didn't hurt."  She denied any past suicide attempts, hallucinations, homicidal ideations, Paranoia or drug use. She reported that she had been clean from alcohol for two years until last month When "donald trump was elected".  Since then patient has been drinking daily at least a small bottle Of vodka and today patient drank two bottles of wine.  Patient reported that if she does not drink alcohol, she  Cannot stop crying, she "can't cope" and her "heart pounds."  Consulted with NP May who recommended patient be medically cleared in the ED and then go into [facility] observation unit.    Diagnosis: 303.90 Alcohol use disorder, Severe                     296.32 Major depressive disorder, Recurrent episode, Moderate   Past Medical History:  Past Medical History  Diagnosis Date  . Depression   . Migraine   . Fibromyalgia   . Hearing impairment   . Arthritis   . Cardiomyopathy   . Pulmonary embolism ([place])   . Gastritis   . Osteoporosis   . Anemia   . Allergy     SEASONAL  . Anxiety   . GERD (gastroesophageal reflux disease)   . Hypertension     [name], take htn medication to regulate heart beat.  . Sleep apnea   . Vitamin D deficiency     Past Surgical History  Procedure Laterality Date  . Appendectomy    . Inner ear surgery      TUBES  . Tonsillectomy and  adenoidectomy    . Tubal ligation    . Laparoscopic cholecystectomy  2012  . Cardiac surgery  January 2007    mitral valve repair  . Cardiac surgery  1973    atrial septal defect  . Cochlear implant      Family History:  Family History  Problem Relation Age of Onset  . Breast cancer Mother     bilateral; ages 17 and 11; TAH/BSO ~50  . Depression Sister   . Heart disease Father   . Hypertension Father   . Colon cancer Neg Hx   . Esophageal cancer Neg Hx   . Pancreatic cancer Neg Hx   . Rectal cancer Neg Hx   . Stomach cancer Neg Hx     Social History:  reports that she has never smoked. She has never used smokeless tobacco. She reports that she does not drink alcohol or use illicit drugs.  Additional Social History:  Alcohol / Drug Use Pain Medications:  (see mar) Prescriptions:  (see mar) Over the Counter:  (see mar) History of alcohol / drug use?: Yes Longest period of sobriety (when/how long):  (2 years) Negative Consequences of Use: Financial, Personal relationships Withdrawal Symptoms:  (see medical chart) Substance #1 Name of  Substance 1:  (alcohol) 1 - Age of First Use:  (40's ) 1 - Amount (size/oz):  (small bottle of vodka or bottles of wine) 1 - Frequency:  (daily) 1 - Last Use / Amount:  (today two bottles of wine)  CIWA:   COWS:    Allergies:  Allergies  Allergen Reactions  . Depakote [Divalproex Sodium] Anaphylaxis  . Valium Anaphylaxis    Home Medications:  (Not in a hospital admission)  OB/GYN Status:  No LMP recorded. Patient is postmenopausal.  General Assessment Data Location of Assessment: [facility] Assessment Services TTS Assessment: In system Is this a Tele or Face-to-Face Assessment?: Face-to-Face Is this an Initial Assessment or a Re-assessment for this encounter?: Initial Assessment Marital status: Divorced [place] name: ' (unknown) Is patient pregnant?: No Pregnancy Status: No Living Arrangements: Children Can pt return to current  living arrangement?: Yes Admission Status: Voluntary Is patient capable of signing voluntary admission?: Yes Referral Source: Self/Family/Friend Insurance type:  Passenger transport manager)  Medical Screening Exam ([place]) Medical Exam completed: No Reason for MSE not completed: Other: (in process going over to [facility] for medical clearance)  Crisis Care Plan Living Arrangements: Children Name of Psychiatrist:  (Dr. Casimiro Needle) Name of Therapist:  (not going back)  Education Status Is patient currently in school?: No Current Grade:  (none) Highest grade of school patient has completed:  (high school) Name of school:  (n/a) Contact person:  (n/a)  Risk to self with the past 6 months Suicidal Ideation: Yes-Currently Present Has patient been a risk to self within the past 6 months prior to admission? : No Suicidal Intent: No Has patient had any suicidal intent within the past 6 months prior to admission? : No Is patient at risk for suicide?: No Suicidal Plan?: No Has patient had any suicidal plan within the past 6 months prior to admission? : No Access to Means: No What has been your use of drugs/alcohol within the last 12 months?:  (drinking alcohol daily) Previous Attempts/Gestures: No How many times?:  (0) Other Self Harm Risks:  (none) Triggers for Past Attempts:  (no past attempts) Intentional Self Injurious Behavior: None Family Suicide History: No Recent stressful life event(s): Other (Comment) ("trump was elected") Persecutory voices/beliefs?: No Depression: Yes Depression Symptoms: Despondent, Insomnia, Tearfulness, Isolating, Fatigue, Guilt, Loss of interest in usual pleasures, Feeling worthless/self pity Substance abuse history and/or treatment for substance abuse?: Yes Suicide prevention information given to non-admitted patients: Not applicable  Risk to Others within the past 6 months Homicidal Ideation: No Does patient have any lifetime risk of violence toward others  beyond the six months prior to admission? : No Thoughts of Harm to Others: No Current Homicidal Intent: No Current Homicidal Plan: No Access to Homicidal Means: No Identified Victim:  (none) History of harm to others?: No Assessment of Violence: None Noted Violent Behavior Description:  (none) Does patient have access to weapons?: No Criminal Charges Pending?: No Does patient have a court date: No Is patient on probation?: No  Psychosis Hallucinations: None noted Delusions: None noted  Mental Status Report Appearance/Hygiene: Disheveled Eye Contact: Fair Motor Activity: Freedom of movement Speech: Logical/coherent Level of Consciousness: Alert Mood: Depressed Affect: Appropriate to circumstance Anxiety Level: Minimal Thought Processes: Coherent Judgement: Impaired Orientation: Person, Place, Time, Situation Obsessive Compulsive Thoughts/Behaviors: None  Cognitive Functioning Concentration: Decreased Memory: Recent Intact, Remote Intact IQ: Average Insight: Poor Impulse Control: Fair Appetite: Fair Weight Loss:  (none) Weight Gain:  (none) Sleep: No Change Total Hours of Sleep:  (  varies with alcohol use) Vegetative Symptoms: None  ADLScreening [facility] Assessment Services) Patient's cognitive ability adequate to safely complete daily activities?: Yes Patient able to express need for assistance with ADLs?: Yes Independently performs ADLs?: Yes (appropriate for developmental age)  Prior Inpatient Therapy Prior Inpatient Therapy: No Prior Therapy Dates:  (n/a) Prior Therapy Facilty/Provider(s):  (n/a) Reason for Treatment:  (n/a)  Prior Outpatient Therapy Prior Outpatient Therapy: Yes Prior Therapy Dates:  (was seeing a counselor past several years) Prior Therapy Facilty/Provider(s):  [facility]) Reason for Treatment:  (depression) Does patient have an ACCT team?: No Does patient have Intensive In-House Services?  : No Does patient have [facility] services? :  No Does patient have P4CC services?: No  ADL Screening (condition at time of admission) Patient's cognitive ability adequate to safely complete daily activities?: Yes Is the patient deaf or have difficulty hearing?: Yes Does the patient have difficulty seeing, even when wearing glasses/contacts?: No Does the patient have difficulty concentrating, remembering, or making decisions?: No Patient able to express need for assistance with ADLs?: Yes Does the patient have difficulty dressing or bathing?: No Independently performs ADLs?: Yes (appropriate for developmental age) Does the patient have difficulty walking or climbing stairs?: Yes Weakness of Legs: None Weakness of Arms/Hands: None  Home Assistive Devices/Equipment Home Assistive Devices/Equipment: None  Therapy Consults (therapy consults require a physician order) PT Evaluation Needed: No OT Evalulation Needed: No SLP Evaluation Needed: No Abuse/Neglect Assessment (Assessment to be complete while patient is alone) Physical Abuse: Denies Verbal Abuse: Denies Sexual Abuse: Denies Exploitation of patient/patient's resources: Denies Self-Neglect: Denies Values / Beliefs Cultural Requests During Hospitalization: None Spiritual Requests During Hospitalization: None Consults Spiritual Care Consult Needed: No Social Work Consult Needed: No Regulatory affairs officer (For Healthcare) Does patient have an advance directive?: No Would patient like information on creating an advanced directive?: No - patient declined information    Additional Information 1:1 In Past 12 Months?: No CIRT Risk: No Elopement Risk: No Does patient have medical clearance?: No     Disposition:  Disposition Initial Assessment Completed for this Encounter: Yes Disposition of Patient: Other dispositions Other disposition(s): Other (Comment) (observation unit after medical clearance)  On Site Evaluation by:   Reviewed with Physician:    Carlean Jews 04/16/2015 6:37 PM

## 2015-04-24 DIAGNOSIS — G43719 Chronic migraine without aura, intractable, without status migrainosus: Secondary | ICD-10-CM | POA: Diagnosis not present

## 2015-04-24 DIAGNOSIS — M791 Myalgia: Secondary | ICD-10-CM | POA: Diagnosis not present

## 2015-04-24 DIAGNOSIS — M542 Cervicalgia: Secondary | ICD-10-CM | POA: Diagnosis not present

## 2015-04-24 DIAGNOSIS — G43111 Migraine with aura, intractable, with status migrainosus: Secondary | ICD-10-CM | POA: Diagnosis not present

## 2015-04-24 DIAGNOSIS — G518 Other disorders of facial nerve: Secondary | ICD-10-CM | POA: Diagnosis not present

## 2015-04-24 DIAGNOSIS — R51 Headache: Secondary | ICD-10-CM | POA: Diagnosis not present

## 2015-05-14 DIAGNOSIS — J069 Acute upper respiratory infection, unspecified: Secondary | ICD-10-CM | POA: Diagnosis not present

## 2015-05-26 ENCOUNTER — Other Ambulatory Visit: Payer: Self-pay | Admitting: Cardiology

## 2015-06-04 ENCOUNTER — Ambulatory Visit (HOSPITAL_COMMUNITY): Payer: Self-pay | Admitting: Psychiatry

## 2015-07-11 ENCOUNTER — Other Ambulatory Visit: Payer: Self-pay | Admitting: Cardiology

## 2015-07-17 ENCOUNTER — Emergency Department (HOSPITAL_COMMUNITY)
Admission: EM | Admit: 2015-07-17 | Discharge: 2015-07-17 | Disposition: A | Discharge status: Home / Self Care | Payer: Medicare Other | Attending: Emergency Medicine | Admitting: Emergency Medicine

## 2015-07-17 ENCOUNTER — Emergency Department (HOSPITAL_COMMUNITY): Payer: Medicare Other

## 2015-07-17 ENCOUNTER — Encounter (HOSPITAL_COMMUNITY): Payer: Self-pay

## 2015-07-17 DIAGNOSIS — G8929 Other chronic pain: Secondary | ICD-10-CM | POA: Diagnosis not present

## 2015-07-17 DIAGNOSIS — M797 Fibromyalgia: Secondary | ICD-10-CM | POA: Diagnosis not present

## 2015-07-17 DIAGNOSIS — R319 Hematuria, unspecified: Secondary | ICD-10-CM | POA: Insufficient documentation

## 2015-07-17 DIAGNOSIS — W06XXXA Fall from bed, initial encounter: Secondary | ICD-10-CM | POA: Diagnosis not present

## 2015-07-17 DIAGNOSIS — Z7982 Long term (current) use of aspirin: Secondary | ICD-10-CM | POA: Insufficient documentation

## 2015-07-17 DIAGNOSIS — K219 Gastro-esophageal reflux disease without esophagitis: Secondary | ICD-10-CM | POA: Diagnosis not present

## 2015-07-17 DIAGNOSIS — Z79899 Other long term (current) drug therapy: Secondary | ICD-10-CM | POA: Diagnosis not present

## 2015-07-17 DIAGNOSIS — Y9389 Activity, other specified: Secondary | ICD-10-CM | POA: Diagnosis not present

## 2015-07-17 DIAGNOSIS — K0889 Other specified disorders of teeth and supporting structures: Secondary | ICD-10-CM | POA: Insufficient documentation

## 2015-07-17 DIAGNOSIS — Y9289 Other specified places as the place of occurrence of the external cause: Secondary | ICD-10-CM | POA: Insufficient documentation

## 2015-07-17 DIAGNOSIS — D649 Anemia, unspecified: Secondary | ICD-10-CM | POA: Diagnosis not present

## 2015-07-17 DIAGNOSIS — M1990 Unspecified osteoarthritis, unspecified site: Secondary | ICD-10-CM | POA: Diagnosis not present

## 2015-07-17 DIAGNOSIS — S20229A Contusion of unspecified back wall of thorax, initial encounter: Secondary | ICD-10-CM | POA: Insufficient documentation

## 2015-07-17 DIAGNOSIS — F329 Major depressive disorder, single episode, unspecified: Secondary | ICD-10-CM | POA: Insufficient documentation

## 2015-07-17 DIAGNOSIS — E559 Vitamin D deficiency, unspecified: Secondary | ICD-10-CM | POA: Diagnosis not present

## 2015-07-17 DIAGNOSIS — S00411A Abrasion of right ear, initial encounter: Secondary | ICD-10-CM | POA: Diagnosis not present

## 2015-07-17 DIAGNOSIS — R55 Syncope and collapse: Secondary | ICD-10-CM | POA: Insufficient documentation

## 2015-07-17 DIAGNOSIS — Y998 Other external cause status: Secondary | ICD-10-CM | POA: Insufficient documentation

## 2015-07-17 DIAGNOSIS — S199XXA Unspecified injury of neck, initial encounter: Secondary | ICD-10-CM | POA: Insufficient documentation

## 2015-07-17 DIAGNOSIS — I10 Essential (primary) hypertension: Secondary | ICD-10-CM | POA: Insufficient documentation

## 2015-07-17 DIAGNOSIS — F419 Anxiety disorder, unspecified: Secondary | ICD-10-CM | POA: Diagnosis not present

## 2015-07-17 DIAGNOSIS — M8580 Other specified disorders of bone density and structure, unspecified site: Secondary | ICD-10-CM | POA: Insufficient documentation

## 2015-07-17 DIAGNOSIS — Z86711 Personal history of pulmonary embolism: Secondary | ICD-10-CM | POA: Diagnosis not present

## 2015-07-17 DIAGNOSIS — S0990XA Unspecified injury of head, initial encounter: Secondary | ICD-10-CM | POA: Diagnosis not present

## 2015-07-17 DIAGNOSIS — S299XXA Unspecified injury of thorax, initial encounter: Secondary | ICD-10-CM | POA: Diagnosis not present

## 2015-07-17 DIAGNOSIS — G43909 Migraine, unspecified, not intractable, without status migrainosus: Secondary | ICD-10-CM | POA: Insufficient documentation

## 2015-07-17 LAB — BASIC METABOLIC PANEL
Anion gap: 11 (ref 5–15)
BUN: 19 mg/dL (ref 6–20)
CO2: 26 mmol/L (ref 22–32)
Calcium: 8.9 mg/dL (ref 8.9–10.3)
Chloride: 101 mmol/L (ref 101–111)
Creatinine, Ser: 0.73 mg/dL (ref 0.44–1.00)
GFR calc Af Amer: >60 mL/min (ref >60)
GFR calc non Af Amer: >60 mL/min (ref >60)
Glucose, Bld: 111 mg/dL — ABNORMAL HIGH (ref 65–99)
Potassium: 3.9 mmol/L (ref 3.5–5.1)
Sodium: 138 mmol/L (ref 135–145)

## 2015-07-17 LAB — URINALYSIS, ROUTINE W REFLEX MICROSCOPIC
Bilirubin Urine: NEGATIVE
Glucose, UA: NEGATIVE mg/dL
KETONES UR: NEGATIVE mg/dL
Leukocytes, UA: NEGATIVE
Nitrite: NEGATIVE
Protein, ur: NEGATIVE mg/dL
Specific Gravity, Urine: 1.024 (ref 1.005–1.030)
pH: 6 (ref 5.0–8.0)

## 2015-07-17 LAB — CBC
HCT: 38.9 % (ref 36.0–46.0)
Hemoglobin: 13.1 g/dL (ref 12.0–15.0)
MCH: 33.8 pg (ref 26.0–34.0)
MCHC: 33.7 g/dL (ref 30.0–36.0)
MCV: 100.3 fL — ABNORMAL HIGH (ref 78.0–100.0)
PLATELETS: 145 10*3/uL — AB (ref 150–400)
RBC: 3.88 MIL/uL (ref 3.87–5.11)
RDW: 18 % — AB (ref 11.5–15.5)
WBC: 3.2 10*3/uL — ABNORMAL LOW (ref 4.0–10.5)

## 2015-07-17 LAB — CBG MONITORING, ED: GLUCOSE-CAPILLARY: 107 mg/dL — AB (ref 65–99)

## 2015-07-17 LAB — URINE MICROSCOPIC-ADD ON
Bacteria, UA: NONE SEEN
RBC / HPF: NONE SEEN RBC/hpf (ref 0–5)

## 2015-07-17 MED ORDER — SODIUM CHLORIDE 0.9 % IV BOLUS (SEPSIS): 1000.0000 mL | Freq: Once | INTRAVENOUS | Status: DC

## 2015-07-17 MED ORDER — HYDROCODONE-ACETAMINOPHEN 5-325 MG PO TABS: 1.0000 | ORAL_TABLET | ORAL | Status: DC | PRN

## 2015-07-17 MED ORDER — HYDROMORPHONE HCL 1 MG/ML IJ SOLN
1.0000 mg | Freq: Once | INTRAMUSCULAR | Status: AC
  Administered 2015-07-17: 1 mg via INTRAVENOUS
  Filled 2015-07-17: qty 1

## 2015-07-17 MED ORDER — SODIUM CHLORIDE 0.9 % IV BOLUS (SEPSIS)
1000.0000 mL | Freq: Once | INTRAVENOUS | Status: AC
  Administered 2015-07-17: 1000 mL via INTRAVENOUS

## 2015-07-17 MED ORDER — MORPHINE SULFATE (PF) 4 MG/ML IV SOLN
4.0000 mg | Freq: Once | INTRAVENOUS | Status: AC
  Administered 2015-07-17: 4 mg via INTRAVENOUS
  Filled 2015-07-17: qty 1

## 2015-07-17 MED ORDER — MORPHINE SULFATE (PF) 4 MG/ML IV SOLN: 6.0000 mg | Freq: Once | INTRAVENOUS | Status: DC

## 2015-07-17 MED ORDER — PENICILLIN V POTASSIUM 500 MG PO TABS: 500.0000 mg | ORAL_TABLET | Freq: Four times a day (QID) | ORAL | Status: DC

## 2015-07-17 NOTE — ED Notes (Addendum)
Pt c/o R side headache/head pain and R ear laceration after syncopal episode and fall out of bed this morning and R lower dental pain x 1.5 weeks.  Pain score 8/10.  Pt reports "I was laying in bed and started feeling very nauseous.  I grabbed a bowl next to my bed.  The next thing I know, I was on the floor.  I think, I passed out.  I landed on a power strip w/ a bunch of plugs."  Blood noted in R ear.  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  Sts it is still working correctly.         Pt is hearing impaired and prefers to read lips.

## 2015-07-17 NOTE — Discharge Instructions (Signed)
Read the information below.  Use the prescribed medication as directed.  Please discuss all new medications with your pharmacist.  Do not take additional tylenol while taking the prescribed pain medication to avoid overdose.  You may return to the Emergency Department at any time for worsening condition or any new symptoms that concern you.  Please call the dentist listed above within 48 hours to schedule a close follow up appointment.  If you develop fevers, swelling in your face, difficulty swallowing or breathing, return to the ER immediately for a recheck.  If you develop chest pain, shortness of breath, fever, you pass out again, or become weak or dizzy, return to the ER for a recheck.      Syncope Syncope is a medical term for fainting or passing out. This means you lose consciousness and drop to the ground. People are generally unconscious for less than 5 minutes. You may have some muscle twitches for up to 15 seconds before waking up and returning to normal. Syncope occurs more often in older adults, but it can happen to anyone. While most causes of syncope are not dangerous, syncope can be a sign of a serious medical problem. It is important to seek medical care.  CAUSES  Syncope is caused by a sudden drop in blood flow to the brain. The specific cause is often not determined. Factors that can bring on syncope include:  Taking medicines that lower blood pressure.  Sudden changes in posture, such as standing up quickly.  Taking more medicine than prescribed.  Standing in one place for too long.  Seizure disorders.  Dehydration and excessive exposure to heat.  Low blood sugar (hypoglycemia).  Straining to have a bowel movement.  Heart disease, irregular heartbeat, or other circulatory problems.  Fear, emotional distress, seeing blood, or severe pain. SYMPTOMS  Right before fainting, you may:  Feel dizzy or light-headed.  Feel nauseous.  See all white or all black in your  field of vision.  Have cold, clammy skin. DIAGNOSIS  Your health care provider will ask about your symptoms, perform a physical exam, and perform an electrocardiogram (ECG) to record the electrical activity of your heart. Your health care provider may also perform other heart or blood tests to determine the cause of your syncope which may include:  Transthoracic echocardiogram (TTE). During echocardiography, sound waves are used to evaluate how blood flows through your heart.  Transesophageal echocardiogram (TEE).  Cardiac monitoring. This allows your health care provider to monitor your heart rate and rhythm in real time.  Holter monitor. This is a portable device that records your heartbeat and can help diagnose heart arrhythmias. It allows your health care provider to track your heart activity for several days, if needed.  Stress tests by exercise or by giving medicine that makes the heart beat faster. TREATMENT  In most cases, no treatment is needed. Depending on the cause of your syncope, your health care provider may recommend changing or stopping some of your medicines. HOME CARE INSTRUCTIONS  Have someone stay with you until you feel stable.  Do not drive, use machinery, or play sports until your health care provider says it is okay.  Keep all follow-up appointments as directed by your health care provider.  Lie down right away if you start feeling like you might faint. Breathe deeply and steadily. Wait until all the symptoms have passed.  Drink enough fluids to keep your urine clear or pale yellow.  If you are taking blood pressure  or heart medicine, get up slowly and take several minutes to sit and then stand. This can reduce dizziness. SEEK IMMEDIATE MEDICAL CARE IF:   You have a severe headache.  You have unusual pain in the chest, abdomen, or back.  You are bleeding from your mouth or rectum, or you have black or tarry stool.  You have an irregular or very fast  heartbeat.  You have pain with breathing.  You have repeated fainting or seizure-like jerking during an episode.  You faint when sitting or lying down.  You have confusion.  You have trouble walking.  You have severe weakness.  You have vision problems. If you fainted, call your local emergency services (911 in [place] Do not drive yourself to the hospital.    This information is not intended to replace advice given to you by your health care provider. Make sure you discuss any questions you have with your health care provider.   Document Released: 04/26/2005 Document Revised: 09/10/2014 Document Reviewed: 06/25/2011 Elsevier Interactive Patient Education [employer].

## 2015-07-17 NOTE — ED Notes (Signed)
Patient transported to CT 

## 2015-07-17 NOTE — ED Provider Notes (Signed)
CSN: [id number]     Arrival date & time 07/17/15  0749 History   First MD Initiated Contact with Patient 07/17/15 [phone]     Chief Complaint  Patient presents with  . Loss of Consciousness  . Headache  . Neck Pain     (Consider location/radiation/quality/duration/timing/severity/associated sxs/prior Treatment) Patient is a 55 y.o. female presenting with syncope, headaches, and neck pain. The history is provided by the patient.  Loss of Consciousness Associated symptoms: headaches   Headache Associated symptoms: neck pain and syncope   Neck Pain Associated symptoms: headaches and syncope     Patient with hx fibromyalgia, PE, HTN developed nausea this morning, sat down and picked up a bowl to throw up in, then woke up on the floor.  When she woke up she had pain in her right ear, right sided headache, and neck pain.  The headache has improved but has become diffuse.  The neck pain is bilateral and posterior and feels like "whiplash."  Has had dental pain x 1.5 weeks in her lower incisions.  Over the past day developed mild URI symptoms Denies CP, SOB, focal neurologic deficits.    Past Medical History  Diagnosis Date  . Depression   . Migraine   . Fibromyalgia   . Hearing impairment   . Arthritis   . Cardiomyopathy   . Pulmonary embolism ([place])   . Gastritis   . Osteoporosis   . Anemia   . Allergy     SEASONAL  . Anxiety   . GERD (gastroesophageal reflux disease)   . Hypertension     [name], take htn medication to regulate heart beat.  . Sleep apnea   . Vitamin D deficiency    Past Surgical History  Procedure Laterality Date  . Appendectomy    . Inner ear surgery      TUBES  . Tonsillectomy and adenoidectomy    . Tubal ligation    . Laparoscopic cholecystectomy  2012  . Cardiac surgery  January 2007    mitral valve repair  . Cardiac surgery  1973    atrial septal defect  . Cochlear implant     Family History  Problem Relation Age of Onset  . Breast cancer Mother      bilateral; ages 66 and 11; TAH/BSO ~50  . Depression Sister   . Heart disease Father   . Hypertension Father   . Colon cancer Neg Hx   . Esophageal cancer Neg Hx   . Pancreatic cancer Neg Hx   . Rectal cancer Neg Hx   . Stomach cancer Neg Hx    Social History  Substance Use Topics  . Smoking status: Never Smoker   . Smokeless tobacco: Never Used  . Alcohol Use: No   OB History    Gravida Para Term Preterm AB TAB SAB Ectopic Multiple Living   2 2 2       2      Review of Systems  Cardiovascular: Positive for syncope.  Musculoskeletal: Positive for neck pain.  Neurological: Positive for headaches.  All other systems reviewed and are negative.     Allergies  Depakote and Valium  Home Medications   Prior to Admission medications   Medication Sig Start Date End Date Taking? Authorizing Provider  acetaminophen (TYLENOL) 500 MG tablet Take 1,000 mg by mouth every 6 (six) hours as needed for mild pain, moderate pain or headache.    Yes Historical Provider, MD  aspirin 81 MG chewable tablet Chew 81  mg by mouth daily.   Yes Historical Provider, MD  carvedilol (COREG) 25 MG tablet TAKE 1 TABLET BY MOUTH TWICE A DAY WITH MEALS (APPOINTMENT NEEDED FOR ADDITIONAL REFILLS) 07/11/15  Yes Jerline Pain, MD  Cholecalciferol (VITAMIN D-3 PO) Take 2 capsules by mouth daily.   Yes Historical Provider, MD  Cyanocobalamin (VITAMIN B-12 PO) Take 1 tablet by mouth daily.   Yes Historical Provider, MD  Ferrous Sulfate (IRON) 142 (45 Fe) MG TBCR Take 1 tablet by mouth daily.   Yes Historical Provider, MD  fexofenadine (ALLEGRA) 180 MG tablet Take 180 mg by mouth daily.   Yes Historical Provider, MD  gabapentin (NEURONTIN) 100 MG capsule Take 100 mg by mouth daily as needed (headache).    Yes Historical Provider, MD  gabapentin (NEURONTIN) 600 MG tablet Take 600 mg by mouth at bedtime.   Yes Historical Provider, MD  hydrochlorothiazide (MICROZIDE) 12.5 MG capsule Take 1 capsule (12.5 mg total) by  mouth every morning. 05/16/14  Yes Jerline Pain, MD  mirabegron ER (MYRBETRIQ) 50 MG TB24 tablet Take 1 tablet (50 mg total) by mouth daily. 07/17/14  Yes Terrance Mass, MD  Multiple Vitamin (MULTIVITAMIN WITH MINERALS) TABS tablet Take 1 tablet by mouth daily.   Yes Historical Provider, MD  Omega-3 Fatty Acids (FISH OIL) 1000 MG CAPS Take 1 capsule by mouth 2 (two) times daily.    Yes Historical Provider, MD  ondansetron (ZOFRAN ODT) 8 MG disintegrating tablet Take 1 tablet (8 mg total) by mouth every 8 (eight) hours as needed for nausea or vomiting. 04/11/15  Yes Tatyana Kirichenko, PA-C  pantoprazole (PROTONIX) 40 MG tablet TAKE 1 TABLET (40 MG TOTAL) BY MOUTH DAILY. 03/29/15  Yes Davonna Belling, MD  prazosin (MINIPRESS) 2 MG capsule Take 2 mg by mouth at bedtime. 07/07/15  Yes Historical Provider, MD  VIIBRYD 40 MG TABS Take 40 mg by mouth every morning. 07/07/15  Yes Historical Provider, MD  oxyCODONE-acetaminophen (PERCOCET) 5-325 MG tablet Take 1 tablet by mouth every 6 (six) hours as needed. Patient not taking: Reported on 04/11/2015 03/29/15   Davonna Belling, MD   BP 134/92 mmHg  Pulse 82  Temp(Src) 98.5 F (36.9 C) (Oral)  Resp 18  SpO2 99% Physical Exam  Constitutional: She appears well-developed and well-nourished. No distress.  HENT:  Head: Normocephalic and atraumatic.  Right Ear: Ear canal normal. Tympanic membrane is scarred.  Left Ear: Tympanic membrane and ear canal normal.  Neck: Neck supple.  Cardiovascular: Normal rate and regular rhythm.   Pulmonary/Chest: Effort normal and breath sounds normal. No respiratory distress. She has no wheezes. She has no rales.  Abdominal: Soft. She exhibits no distension. There is no tenderness. There is no rebound and no guarding.  Neurological: She is alert.  Skin: She is not diaphoretic.  Yellow bruising over back in thoracic area  Nursing note and vitals reviewed.   ED Course  Procedures (including critical care time) Labs  Review Labs Reviewed  CBC - Abnormal; Notable for the following:    WBC 3.2 (*)    MCV 100.3 (*)    RDW 18.0 (*)    Platelets 145 (*)    All other components within normal limits  URINALYSIS, ROUTINE W REFLEX MICROSCOPIC (NOT AT [facility]) - Abnormal; Notable for the following:    APPearance CLOUDY (*)    Hgb urine dipstick MODERATE (*)    All other components within normal limits  BASIC METABOLIC PANEL - Abnormal; Notable for the following:  Glucose, Bld 111 (*)    All other components within normal limits  URINE MICROSCOPIC-ADD ON - Abnormal; Notable for the following:    Squamous Epithelial / LPF 0-5 (*)    All other components within normal limits  CBG MONITORING, ED - Abnormal; Notable for the following:    Glucose-Capillary 107 (*)    All other components within normal limits    Imaging Review Dg Chest 2 View  07/17/2015  CLINICAL DATA:  Syncopal episode with fall EXAM: CHEST  2 VIEW COMPARISON:  12/30/2014 FINDINGS: The heart size and mediastinal contours are within normal limits. Both lungs are clear. The visualized skeletal structures show a stable compression deformities of T5 and L1. Postsurgical changes are again identified. IMPRESSION: No active cardiopulmonary disease. Electronically Signed   By: Inez Catalina M.D.   On: 07/17/2015 11:27   Ct Head Wo Contrast  07/17/2015  CLINICAL DATA:  Posttraumatic headache in laceration after syncope and fall out of bed today. Possible loss of consciousness. EXAM: CT HEAD WITHOUT CONTRAST CT CERVICAL SPINE WITHOUT CONTRAST TECHNIQUE: Multidetector CT imaging of the head and cervical spine was performed following the standard protocol without intravenous contrast. Multiplanar CT image reconstructions of the cervical spine were also generated. COMPARISON:  CT scan of December 15, 2014. FINDINGS: CT HEAD FINDINGS Bony calvarium is intact. Right-sided cochlear implant is noted. Mild chronic ischemic white matter disease is noted. No mass effect or  midline shift is noted. Ventricular size is within normal limits. There is no evidence of mass lesion, hemorrhage or acute infarction. CT CERVICAL SPINE FINDINGS No fracture is noted. Minimal grade 1 anterolisthesis of C3-4 is noted secondary to right-sided posterior facet joint hypertrophy. Mild to moderate degenerative disc disease is noted at C3-4, C4-5 and C5-6. Anterior osteophyte formation is noted at C4-5 and C5-6. Mild levoscoliosis of cervical spine is noted. Visualized lung apices appear normal. IMPRESSION: Mild chronic ischemic white matter disease. No acute intracranial abnormality seen. Multilevel degenerative disc disease is noted. No acute abnormality seen in the cervical spine. Electronically Signed   By: Marijo Conception, M.D.   On: 07/17/2015 11:28   Ct Cervical Spine Wo Contrast  07/17/2015  CLINICAL DATA:  Posttraumatic headache in laceration after syncope and fall out of bed today. Possible loss of consciousness. EXAM: CT HEAD WITHOUT CONTRAST CT CERVICAL SPINE WITHOUT CONTRAST TECHNIQUE: Multidetector CT imaging of the head and cervical spine was performed following the standard protocol without intravenous contrast. Multiplanar CT image reconstructions of the cervical spine were also generated. COMPARISON:  CT scan of December 15, 2014. FINDINGS: CT HEAD FINDINGS Bony calvarium is intact. Right-sided cochlear implant is noted. Mild chronic ischemic white matter disease is noted. No mass effect or midline shift is noted. Ventricular size is within normal limits. There is no evidence of mass lesion, hemorrhage or acute infarction. CT CERVICAL SPINE FINDINGS No fracture is noted. Minimal grade 1 anterolisthesis of C3-4 is noted secondary to right-sided posterior facet joint hypertrophy. Mild to moderate degenerative disc disease is noted at C3-4, C4-5 and C5-6. Anterior osteophyte formation is noted at C4-5 and C5-6. Mild levoscoliosis of cervical spine is noted. Visualized lung apices appear  normal. IMPRESSION: Mild chronic ischemic white matter disease. No acute intracranial abnormality seen. Multilevel degenerative disc disease is noted. No acute abnormality seen in the cervical spine. Electronically Signed   By: Marijo Conception, M.D.   On: 07/17/2015 11:28      EKG Interpretation None  ED ECG REPORT   Date: 07/17/2015  Rate: 82  Rhythm: normal sinus rhythm  QRS Axis: normal  Intervals: borderline prolonged QT  ST/T Wave abnormalities: nonspecific T wave changes  Conduction Disutrbances:none  Narrative Interpretation:   Old EKG Reviewed: none available  I have personally reviewed the EKG tracing and agree with the computerized printout as noted.   1:21 PM Pt reports she is feeling much better, still has some neck pain but able to rotate neck 45 degrees to each side.  Reports chronic neck pain, follows with orthopedics.    MDM   Final diagnoses:  Syncope, unspecified syncope type  Pain, dental  Hematuria    Afebrile, nontoxic patient with episode of syncope while vomiting.  Likely vasovagal.  Right ear abrasion.  Workup otherwise unremarkable. Does have asymptomatic hematuria, seen twice in ED - pt advised she will need follow up on this, will follow with PCP.   Pain treated in ED, pt feeling much better.  Does have chronic neck pain.   Also with dental pain without obvious abscess, no ludwig's angina - will be treated with antibiotic and recommended dental follow up.  She does have early URI symptoms but no SOB, no significant or worrisome symptoms.  Discussed pt with Dr Wilson Singer.   D/C home with short course of pain medication, PCP and orthopedic follow up.  Discussed result, findings, treatment, and follow up  with patient.  Pt given return precautions.  Pt verbalizes understanding and agrees with plan.          Clayton Bibles, PA-C 07/17/15 [place], MD 07/18/15 [phone]

## 2015-07-21 DIAGNOSIS — E538 Deficiency of other specified B group vitamins: Secondary | ICD-10-CM | POA: Diagnosis not present

## 2015-07-21 DIAGNOSIS — K0510 Chronic gingivitis, plaque induced: Secondary | ICD-10-CM | POA: Diagnosis not present

## 2015-07-21 DIAGNOSIS — M1611 Unilateral primary osteoarthritis, right hip: Secondary | ICD-10-CM | POA: Diagnosis not present

## 2015-07-21 DIAGNOSIS — J069 Acute upper respiratory infection, unspecified: Secondary | ICD-10-CM | POA: Diagnosis not present

## 2015-07-21 DIAGNOSIS — R05 Cough: Secondary | ICD-10-CM | POA: Diagnosis not present

## 2015-08-25 DIAGNOSIS — Z45321 Encounter for adjustment and management of cochlear device: Secondary | ICD-10-CM | POA: Diagnosis not present

## 2015-08-25 DIAGNOSIS — H903 Sensorineural hearing loss, bilateral: Secondary | ICD-10-CM | POA: Diagnosis not present

## 2015-09-02 DIAGNOSIS — M25551 Pain in right hip: Secondary | ICD-10-CM | POA: Diagnosis not present

## 2015-09-03 ENCOUNTER — Emergency Department (HOSPITAL_COMMUNITY)
Admission: EM | Admit: 2015-09-03 | Discharge: 2015-09-04 | Disposition: A | Discharge status: Home / Self Care | Payer: Medicare Other | Attending: Emergency Medicine | Admitting: Emergency Medicine

## 2015-09-03 ENCOUNTER — Emergency Department (HOSPITAL_COMMUNITY): Payer: Medicare Other

## 2015-09-03 ENCOUNTER — Encounter (HOSPITAL_COMMUNITY): Payer: Self-pay | Admitting: Adult Health

## 2015-09-03 DIAGNOSIS — Y9389 Activity, other specified: Secondary | ICD-10-CM | POA: Diagnosis not present

## 2015-09-03 DIAGNOSIS — H9190 Unspecified hearing loss, unspecified ear: Secondary | ICD-10-CM | POA: Diagnosis not present

## 2015-09-03 DIAGNOSIS — S0001XA Abrasion of scalp, initial encounter: Secondary | ICD-10-CM | POA: Diagnosis not present

## 2015-09-03 DIAGNOSIS — W1839XA Other fall on same level, initial encounter: Secondary | ICD-10-CM | POA: Diagnosis not present

## 2015-09-03 DIAGNOSIS — E872 Acidosis, unspecified: Secondary | ICD-10-CM

## 2015-09-03 DIAGNOSIS — Y92410 Unspecified street and highway as the place of occurrence of the external cause: Secondary | ICD-10-CM | POA: Diagnosis not present

## 2015-09-03 DIAGNOSIS — F10220 Alcohol dependence with intoxication, uncomplicated: Secondary | ICD-10-CM | POA: Insufficient documentation

## 2015-09-03 DIAGNOSIS — S199XXA Unspecified injury of neck, initial encounter: Secondary | ICD-10-CM | POA: Diagnosis not present

## 2015-09-03 DIAGNOSIS — S0990XA Unspecified injury of head, initial encounter: Secondary | ICD-10-CM | POA: Diagnosis not present

## 2015-09-03 DIAGNOSIS — Y998 Other external cause status: Secondary | ICD-10-CM | POA: Diagnosis not present

## 2015-09-03 DIAGNOSIS — S098XXA Other specified injuries of head, initial encounter: Secondary | ICD-10-CM | POA: Diagnosis not present

## 2015-09-03 DIAGNOSIS — W19XXXA Unspecified fall, initial encounter: Secondary | ICD-10-CM

## 2015-09-03 DIAGNOSIS — S0181XA Laceration without foreign body of other part of head, initial encounter: Secondary | ICD-10-CM | POA: Diagnosis not present

## 2015-09-03 HISTORY — DX: Unspecified hearing loss, unspecified ear: H91.90

## 2015-09-03 LAB — URINE MICROSCOPIC-ADD ON: WBC, UA: NONE SEEN WBC/hpf (ref 0–5)

## 2015-09-03 LAB — COMPREHENSIVE METABOLIC PANEL
ALK PHOS: 67 U/L (ref 38–126)
ALT: 38 U/L (ref 14–54)
AST: 66 U/L — AB (ref 15–41)
Albumin: 3.8 g/dL (ref 3.5–5.0)
Anion gap: 17 — ABNORMAL HIGH (ref 5–15)
BILIRUBIN TOTAL: 0.7 mg/dL (ref 0.3–1.2)
BUN: 15 mg/dL (ref 6–20)
CALCIUM: 9.5 mg/dL (ref 8.9–10.3)
CO2: 21 mmol/L — ABNORMAL LOW (ref 22–32)
CREATININE: 0.77 mg/dL (ref 0.44–1.00)
Chloride: 106 mmol/L (ref 101–111)
Glucose, Bld: 128 mg/dL — ABNORMAL HIGH (ref 65–99)
Potassium: 4.2 mmol/L (ref 3.5–5.1)
Sodium: 144 mmol/L (ref 135–145)
TOTAL PROTEIN: 7 g/dL (ref 6.5–8.1)

## 2015-09-03 LAB — URINALYSIS, ROUTINE W REFLEX MICROSCOPIC
BILIRUBIN URINE: NEGATIVE
Glucose, UA: NEGATIVE mg/dL
KETONES UR: NEGATIVE mg/dL
Leukocytes, UA: NEGATIVE
NITRITE: NEGATIVE
PH: 6.5 (ref 5.0–8.0)
PROTEIN: NEGATIVE mg/dL
Specific Gravity, Urine: 1.006 (ref 1.005–1.030)

## 2015-09-03 LAB — RAPID URINE DRUG SCREEN, HOSP PERFORMED
AMPHETAMINES: NOT DETECTED
BENZODIAZEPINES: NOT DETECTED
Barbiturates: NOT DETECTED
Cocaine: NOT DETECTED
OPIATES: NOT DETECTED
TETRAHYDROCANNABINOL: NOT DETECTED

## 2015-09-03 LAB — ETHANOL: ALCOHOL ETHYL (B): 511 mg/dL — AB (ref <5)

## 2015-09-03 LAB — I-STAT CHEM 8, ED
BUN: 14 mg/dL (ref 6–20)
CREATININE: 1.5 mg/dL — AB (ref 0.44–1.00)
Calcium, Ion: 1.04 mmol/L — ABNORMAL LOW (ref 1.12–1.23)
Chloride: 105 mmol/L (ref 101–111)
GLUCOSE: 128 mg/dL — AB (ref 65–99)
HEMATOCRIT: 44 % (ref 36.0–46.0)
Hemoglobin: 15 g/dL (ref 12.0–15.0)
POTASSIUM: 3.8 mmol/L (ref 3.5–5.1)
Sodium: 144 mmol/L (ref 135–145)
TCO2: 20 mmol/L (ref 0–100)

## 2015-09-03 LAB — CBC
HCT: 40.7 % (ref 36.0–46.0)
Hemoglobin: 13.7 g/dL (ref 12.0–15.0)
MCH: 34.1 pg — ABNORMAL HIGH (ref 26.0–34.0)
MCHC: 33.7 g/dL (ref 30.0–36.0)
MCV: 101.2 fL — AB (ref 78.0–100.0)
PLATELETS: 226 10*3/uL (ref 150–400)
RBC: 4.02 MIL/uL (ref 3.87–5.11)
RDW: 14.2 % (ref 11.5–15.5)
WBC: 5 10*3/uL (ref 4.0–10.5)

## 2015-09-03 LAB — SAMPLE TO BLOOD BANK

## 2015-09-03 LAB — I-STAT CG4 LACTIC ACID, ED: Lactic Acid, Venous: 4.3 mmol/L (ref 0.5–2.0)

## 2015-09-03 LAB — PROTIME-INR
INR: 1.05 (ref 0.00–1.49)
Prothrombin Time: 13.9 seconds (ref 11.6–15.2)

## 2015-09-03 MED ORDER — ZIPRASIDONE MESYLATE 20 MG IM SOLR
20.0000 mg | Freq: Once | INTRAMUSCULAR | Status: AC
  Administered 2015-09-03: 20 mg via INTRAMUSCULAR
  Filled 2015-09-03: qty 20

## 2015-09-03 MED ORDER — SODIUM CHLORIDE 0.9 % IV BOLUS (SEPSIS)
1000.0000 mL | Freq: Once | INTRAVENOUS | Status: AC
  Administered 2015-09-03: 1000 mL via INTRAVENOUS

## 2015-09-03 MED ORDER — SODIUM CHLORIDE 0.9 % IV BOLUS (SEPSIS)
125.0000 mL | Freq: Once | INTRAVENOUS | Status: AC
  Administered 2015-09-03: 125 mL via INTRAVENOUS

## 2015-09-03 MED ORDER — TETANUS-DIPHTHERIA TOXOIDS TD 5-2 LFU IM INJ
0.5000 mL | INJECTION | Freq: Once | INTRAMUSCULAR | Status: DC
  Filled 2015-09-03: qty 0.5

## 2015-09-03 MED ORDER — LORAZEPAM 2 MG/ML IJ SOLN: 1.0000 mg | INTRAMUSCULAR | Status: DC | PRN

## 2015-09-03 MED ORDER — TETANUS-DIPHTH-ACELL PERTUSSIS 5-2.5-18.5 LF-MCG/0.5 IM SUSP
0.5000 mL | Freq: Once | INTRAMUSCULAR | Status: AC
  Administered 2015-09-03: 0.5 mL via INTRAMUSCULAR

## 2015-09-03 MED ORDER — STERILE WATER FOR INJECTION IJ SOLN
INTRAMUSCULAR | Status: AC
  Filled 2015-09-03: qty 10

## 2015-09-03 NOTE — Progress Notes (Signed)
Chaplain responded to level 2 trauma page for pt who hit her head on nightstand at home as she fell. Pt's mom is with her in D60. 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 Chaplain has not had contact with pt or her mom due to staff activity in pt's room.

## 2015-09-03 NOTE — ED Provider Notes (Signed)
CSN: [id number]     Arrival date & time 09/03/15  2018 History   First MD Initiated Contact with Patient 09/03/15 2019     Chief Complaint  Patient presents with  . Fall     (Consider location/radiation/quality/duration/timing/severity/associated sxs/prior Treatment) HPI 55 y.o. female with a hx of congenital deafness and history of intermittent ETOH abuse presents to the ED after drinking heavily an unknown amount of ETOH this evening who was being helped into bed by her daughter at home, lost her balance, and then fell backwards striking her head on the night stand. EMS was called by the patient's daughter and on arrival they found her significantly intoxicated but arousable. Uncertain LOC. Difficulty was had in communicating with the patient given her deafness. On exam in the field she was found to have a scalp abrasion which was hemostatic with pressure from pillow. She has a hearing aid but her mother, who accompanied her with EMS states that it has been malfunctioning recently. 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 No reported seizure activity, incontinence. No focal neurologic deficits nor other injuries other than scalp abrasion noted by EMS.    Past Medical History  Diagnosis Date  . Deaf    No past surgical history on file. No family history on file. Social History  Substance Use Topics  . Smoking status: Not on file  . Smokeless tobacco: Not on file  . Alcohol Use: Not on file   OB History    No data available     Review of Systems  Unable to perform ROS: Other (intoxication and congenital deafness)      Allergies  Diazepam  Home Medications   Prior to Admission medications   Not on File   BP 127/78 mmHg  Pulse 91  Temp(Src) 97.2 F (36.2 C)  Resp 14  SpO2 95% Physical Exam  Constitutional: She appears well-developed and well-nourished. She is uncooperative. She is easily aroused. No distress. Cervical collar in place.  HENT:  Head:  Normocephalic. Head is with abrasion. Head is without raccoon's eyes, without Battle's sign and without laceration.    Right Ear: External ear normal.  Left Ear: External ear normal.  Nose: Nose normal.  Mouth/Throat: Oropharynx is clear and moist.  Eyes: Conjunctivae and EOM are normal. Pupils are equal, round, and reactive to light.  Cardiovascular: Normal rate, regular rhythm, normal heart sounds and intact distal pulses.   Pulmonary/Chest: Effort normal and breath sounds normal.  Abdominal: Soft. She exhibits no distension. There is no tenderness.  Musculoskeletal: She exhibits no edema or tenderness.  Neurological: She is easily aroused. She has normal strength. No cranial nerve deficit or sensory deficit.  Significantly intoxicated but moving all extremities equally. Somnolent but arousable then becoming mildly combative.   Skin: Skin is warm and dry. No rash noted. She is not diaphoretic.  Nursing note and vitals reviewed.   ED Course  Procedures (including critical care time) Labs Review Labs Reviewed  COMPREHENSIVE METABOLIC PANEL - Abnormal; Notable for the following:    CO2 21 (*)    Glucose, Bld 128 (*)    AST 66 (*)    Anion gap 17 (*)    All other components within normal limits  CBC - Abnormal; Notable for the following:    MCV 101.2 (*)    MCH 34.1 (*)    All other components within normal limits  ETHANOL - Abnormal; Notable for the following:    Alcohol, Ethyl (B) 511 (*)  All other components within normal limits  URINALYSIS, ROUTINE W REFLEX MICROSCOPIC (NOT AT [facility]) - Abnormal; Notable for the following:    APPearance CLOUDY (*)    Hgb urine dipstick TRACE (*)    All other components within normal limits  URINE MICROSCOPIC-ADD ON - Abnormal; Notable for the following:    Squamous Epithelial / LPF 0-5 (*)    Bacteria, UA RARE (*)    All other components within normal limits  I-STAT CHEM 8, ED - Abnormal; Notable for the following:    Creatinine, Ser  1.50 (*)    Glucose, Bld 128 (*)    Calcium, Ion 1.04 (*)    All other components within normal limits  I-STAT CG4 LACTIC ACID, ED - Abnormal; Notable for the following:    Lactic Acid, Venous 4.30 (*)    All other components within normal limits  PROTIME-INR  URINE RAPID DRUG SCREEN, HOSP PERFORMED  CDS SEROLOGY  I-STAT CG4 LACTIC ACID, ED  I-STAT CG4 LACTIC ACID, ED  SAMPLE TO BLOOD BANK    Imaging Review Ct Head Wo Contrast  09/03/2015  CLINICAL DATA:  Status post fall. Bleeding at the right side of the head. Level 2 trauma. Concern for cervical spine injury curb. Initial encounter. EXAM: CT HEAD WITHOUT CONTRAST CT CERVICAL SPINE WITHOUT CONTRAST TECHNIQUE: Multidetector CT imaging of the head and cervical spine was performed following the standard protocol without intravenous contrast. Multiplanar CT image reconstructions of the cervical spine were also generated. COMPARISON:  None. FINDINGS: CT HEAD FINDINGS There is no evidence of acute infarction, mass lesion, or intra- or extra-axial hemorrhage on CT. Prominence of ventricles and sulci reflects mild cortical volume loss. Scattered periventricular and subcortical white matter change likely reflects small vessel ischemic microangiopathy. Cerebellar atrophy is noted. The brainstem and fourth ventricle are within normal limits. The basal ganglia are unremarkable in appearance. The cerebral hemispheres demonstrate grossly normal gray-white differentiation. No mass effect or midline shift is seen. There is no evidence of fracture; visualized osseous structures are unremarkable in appearance. The orbits are within normal limits. Mucosal thickening is noted at the left maxillary sinus. There is mild partial opacification of the left mastoid air cells. The remaining paranasal sinuses are well-aerated. No significant soft tissue abnormalities are seen. A right-sided cochlear implant is noted. The patient is status post underlying right-sided  mastoidectomy. CT CERVICAL SPINE FINDINGS There is no evidence of fracture or subluxation. Vertebral bodies demonstrate normal height and alignment. There is chronic partial osseous fusion at C2-C3. There is mild grade 1 retrolisthesis of C4 on C5. Multilevel disc space narrowing is noted along the mid cervical spine, with scattered anterior and posterior disc osteophyte complexes. Prevertebral soft tissues are within normal limits. The thyroid gland is unremarkable in appearance. The visualized lung apices are clear. No significant soft tissue abnormalities are seen. IMPRESSION: 1. No evidence of traumatic intracranial injury or fracture. 2. No evidence of fracture or subluxation along the cervical spine. 3. Mild cortical volume loss and scattered small vessel ischemic microangiopathy. 4. Mucosal thickening at the left maxillary sinus, and mild partial opacification of the left mastoid air cells. 5. Mild degenerative change along the cervical spine, with chronic partial osseous fusion at C2-C3. Electronically Signed   By: Garald Balding M.D.   On: 09/03/2015 22:13   Ct Cervical Spine Wo Contrast  09/03/2015  CLINICAL DATA:  Status post fall. Bleeding at the right side of the head. Level 2 trauma. Concern for cervical spine injury curb.  Initial encounter. EXAM: CT HEAD WITHOUT CONTRAST CT CERVICAL SPINE WITHOUT CONTRAST TECHNIQUE: Multidetector CT imaging of the head and cervical spine was performed following the standard protocol without intravenous contrast. Multiplanar CT image reconstructions of the cervical spine were also generated. COMPARISON:  None. FINDINGS: CT HEAD FINDINGS There is no evidence of acute infarction, mass lesion, or intra- or extra-axial hemorrhage on CT. Prominence of ventricles and sulci reflects mild cortical volume loss. Scattered periventricular and subcortical white matter change likely reflects small vessel ischemic microangiopathy. Cerebellar atrophy is noted. The brainstem and  fourth ventricle are within normal limits. The basal ganglia are unremarkable in appearance. The cerebral hemispheres demonstrate grossly normal gray-white differentiation. No mass effect or midline shift is seen. There is no evidence of fracture; visualized osseous structures are unremarkable in appearance. The orbits are within normal limits. Mucosal thickening is noted at the left maxillary sinus. There is mild partial opacification of the left mastoid air cells. The remaining paranasal sinuses are well-aerated. No significant soft tissue abnormalities are seen. A right-sided cochlear implant is noted. The patient is status post underlying right-sided mastoidectomy. CT CERVICAL SPINE FINDINGS There is no evidence of fracture or subluxation. Vertebral bodies demonstrate normal height and alignment. There is chronic partial osseous fusion at C2-C3. There is mild grade 1 retrolisthesis of C4 on C5. Multilevel disc space narrowing is noted along the mid cervical spine, with scattered anterior and posterior disc osteophyte complexes. Prevertebral soft tissues are within normal limits. The thyroid gland is unremarkable in appearance. The visualized lung apices are clear. No significant soft tissue abnormalities are seen. IMPRESSION: 1. No evidence of traumatic intracranial injury or fracture. 2. No evidence of fracture or subluxation along the cervical spine. 3. Mild cortical volume loss and scattered small vessel ischemic microangiopathy. 4. Mucosal thickening at the left maxillary sinus, and mild partial opacification of the left mastoid air cells. 5. Mild degenerative change along the cervical spine, with chronic partial osseous fusion at C2-C3. Electronically Signed   By: Garald Balding M.D.   On: 09/03/2015 22:13   I have personally reviewed and evaluated these images and lab results as part of my medical decision-making.   EKG Interpretation   Date/Time:  Wednesday September 03 2015 21:59:16 EDT Ventricular  Rate:  93 PR Interval:  193 QRS Duration: 100 QT Interval:  406 QTC Calculation: 505 R Axis:   27 Text Interpretation:  Sinus rhythm Borderline T abnormalities, diffuse  leads Borderline prolonged QT interval Confirmed by Jeneen Rinks  MD, [place]  [phone]) on 09/04/2015 12:27:33 AM      MDM  55 y.o. female with a hx of ETOH abuse and congenital deafness presents to the ED by EMS after she fell while being helped into bed by her daughter, striking her head against the bedside table. On arrival she is intoxicated initially somnolent but arousable, MAE, then becoming confused and mildly combative during trauma assessment. Physical exam significant for abrasion to right lateral scalp. No other significant injuries noted. Labs returned significantly with a lactic acid of 4.3, AG of 17, HCO3 of 21, and ETOH of 511. Cr 1.50 with no previous measures available. During her ED evaluation she became agitated and required a dose of geodon to get an accurate scan. CT head and c-spine were negative for acute fracture, intracranial abnormality, or malalignment. On reassessment at 2330 the patient shows persistent significant intoxication. She was fluid resuscitated in the ED and repeat lactic acid was ordered. The patient was allowed to metabolize in  the ED, and while awaiting rehydration and metabolism of ETOH, her care was signed out to Dr. Leonides Schanz at 0100. Please see her note for further information regarding the remainder of her care while in the ED.   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

## 2015-09-03 NOTE — ED Notes (Signed)
Presents post fall, endorses ETOH-pt is deaf, fell backwards and hit right side of head. Bleeding controlled. Initial GCS 11.

## 2015-09-03 NOTE — Progress Notes (Signed)
Orthopedic Tech Progress Note Patient Details:  [name] 05/10/1875 [id number] Level 2 trauma ortho visit. Patient ID: [name], unknown   DOB: 05/10/1875, 55 y.o.   MRN: [id number]   Braulio Bosch 09/03/2015, 8:22 PM

## 2015-09-04 ENCOUNTER — Encounter (HOSPITAL_COMMUNITY): Payer: Self-pay

## 2015-09-04 DIAGNOSIS — S0001XA Abrasion of scalp, initial encounter: Secondary | ICD-10-CM | POA: Diagnosis not present

## 2015-09-04 LAB — I-STAT CG4 LACTIC ACID, ED
LACTIC ACID, VENOUS: 2.46 mmol/L — AB (ref 0.5–2.0)
Lactic Acid, Venous: 2.93 mmol/L (ref 0.5–2.0)

## 2015-09-04 LAB — CDS SEROLOGY

## 2015-09-04 MED ORDER — SODIUM CHLORIDE 0.9 % IV BOLUS (SEPSIS): 1000.0000 mL | Freq: Once | INTRAVENOUS | Status: DC

## 2015-09-04 MED ORDER — SODIUM CHLORIDE 0.9 % IV BOLUS (SEPSIS)
1000.0000 mL | Freq: Once | INTRAVENOUS | Status: AC
  Administered 2015-09-04: 1000 mL via INTRAVENOUS

## 2015-09-04 MED ORDER — SODIUM CHLORIDE 0.9 % IV SOLN: INTRAVENOUS | Status: DC

## 2015-09-04 NOTE — ED Notes (Signed)
Breakfast tray ordered for pt

## 2015-09-04 NOTE — Discharge Instructions (Signed)
Alcohol Intoxication °Alcohol intoxication occurs when the amount of alcohol that a person has consumed impairs his or her ability to mentally and physically function. Alcohol directly impairs the normal chemical activity of the brain. Drinking large amounts of alcohol can lead to changes in mental function and behavior, and it can cause many physical effects that can be harmful.  °Alcohol intoxication can range in severity from mild to very severe. Various factors can affect the level of intoxication that occurs, such as the person's age, gender, weight, frequency of alcohol consumption, and the presence of other medical conditions (such as diabetes, seizures, or heart conditions). Dangerous levels of alcohol intoxication may occur when people drink large amounts of alcohol in a short period (binge drinking). Alcohol can also be especially dangerous when combined with certain prescription medicines or "recreational" drugs. °SIGNS AND SYMPTOMS °Some common signs and symptoms of mild alcohol intoxication include: °· Loss of coordination. °· Changes in mood and behavior. °· Impaired judgment. °· Slurred speech. °As alcohol intoxication progresses to more severe levels, other signs and symptoms will appear. These may include: °· Vomiting. °· Confusion and impaired memory. °· Slowed breathing. °· Seizures. °· Loss of consciousness. °DIAGNOSIS  °Your health care provider will take a medical history and perform a physical exam. You will be asked about the amount and type of alcohol you have consumed. Blood tests will be done to measure the concentration of alcohol in your blood. In many places, your blood alcohol level must be lower than 80 mg/dL (0.08%) to legally drive. However, many dangerous effects of alcohol can occur at much lower levels.  °TREATMENT  °People with alcohol intoxication often do not require treatment. Most of the effects of alcohol intoxication are temporary, and they go away as the alcohol naturally  leaves the body. Your health care provider will monitor your condition until you are stable enough to go home. Fluids are sometimes given through an IV access tube to help prevent dehydration.  °HOME CARE INSTRUCTIONS °· Do not drive after drinking alcohol. °· Stay hydrated. Drink enough water and fluids to keep your urine clear or pale yellow. Avoid caffeine.   °· Only take over-the-counter or prescription medicines as directed by your health care provider.   °SEEK MEDICAL CARE IF:  °· You have persistent vomiting.   °· You do not feel better after a few days. °· You have frequent alcohol intoxication. Your health care provider can help determine if you should see a substance use treatment counselor. °SEEK IMMEDIATE MEDICAL CARE IF:  °· You become shaky or tremble when you try to stop drinking.   °· You shake uncontrollably (seizure).   °· You throw up (vomit) blood. This may be bright red or may look like black coffee grounds.   °· You have blood in your stool. This may be bright red or may appear as a black, tarry, bad smelling stool.   °· You become lightheaded or faint.   °MAKE SURE YOU:  °· Understand these instructions. °· Will watch your condition. °· Will get help right away if you are not doing well or get worse. °  °This information is not intended to replace advice given to you by your health care provider. Make sure you discuss any questions you have with your health care provider. °  °Document Released: 02/03/2005 Document Revised: 12/27/2012 Document Reviewed: 09/29/2012 °Elsevier Interactive Patient Education ©2016 Elsevier Inc. ° °Head Injury, Adult °You have a head injury. Headaches and throwing up (vomiting) are common after a head injury. It should   be easy to wake up from sleeping. Sometimes you must stay in the hospital. Most problems happen within the first 24 hours. Side effects may occur up to 7-10 days after the injury.  WHAT ARE THE TYPES OF HEAD INJURIES? Head injuries can be as minor as  a bump. Some head injuries can be more severe. More severe head injuries include:  A jarring injury to the brain (concussion).  A bruise of the brain (contusion). This mean there is bleeding in the brain that can cause swelling.  A cracked skull (skull fracture).  Bleeding in the brain that collects, clots, and forms a bump (hematoma). WHEN SHOULD I GET HELP RIGHT AWAY?   You are confused or sleepy.  You cannot be woken up.  You feel sick to your stomach (nauseous) or keep throwing up (vomiting).  Your dizziness or unsteadiness is getting worse.  You have very bad, lasting headaches that are not helped by medicine. Take medicines only as told by your doctor.  You cannot use your arms or legs like normal.  You cannot walk.  You notice changes in the black spots in the center of the colored part of your eye (pupil).  You have clear or bloody fluid coming from your nose or ears.  You have trouble seeing. During the next 24 hours after the injury, you must stay with someone who can watch you. This person should get help right away (call 911 in the [place] if you start to shake and are not able to control it (have seizures), you pass out, or you are unable to wake up. HOW CAN I PREVENT A HEAD INJURY IN THE FUTURE?  Wear seat belts.  Wear a helmet while bike riding and playing sports like football.  Stay away from dangerous activities around the house. WHEN CAN I RETURN TO NORMAL ACTIVITIES AND ATHLETICS? See your doctor before doing these activities. You should not do normal activities or play contact sports until 1 week after the following symptoms have stopped:  Headache that does not go away.  Dizziness.  Poor attention.  Confusion.  Memory problems.  Sickness to your stomach or throwing up.  Tiredness.  Fussiness.  Bothered by bright lights or loud noises.  Anxiousness or depression.  Restless sleep. MAKE SURE YOU:   Understand these instructions.  Will  watch your condition.  Will get help right away if you are not doing well or get worse.   This information is not intended to replace advice given to you by your health care provider. Make sure you discuss any questions you have with your health care provider.   Document Released: 04/08/2008 Document Revised: 05/17/2014 Document Reviewed: 01/01/2013 Elsevier Interactive Patient Education 2016 [facility] The [employer] Ways 211 is a great source of information about community services available.  Access by dialing 2-1-1 from anywhere in [place], or by website -  CustodianSupply.fi.   Other Local Resources (Updated 05/2015)  [facility]  Crisis Hotline, available 24 hours a day, 7 days a week: [place], [place]   [facility]  Crisis Hotline, available 24 hours a day, 7 days a week: [place], [place]  [facility]  Suicide Prevention Hotline, available 24 hours a day, 7 days a week: [place], [place], available 24 hours a day, 7 days a  week: [place], [place] Access to [place], available 24 hours a day, 7 days a week: 779-285-2107 All   [facility]  Crisis Hotline, available 24 hours a day, 7 days a week: 531-869-5848 All   Other Local Resources (Updated 05/2015)  Outpatient Counseling/ Substance Abuse Programs  Services     Address and Phone Number  ADS (Alcohol and Drug Services)   Options include Individual counseling, group counseling, intensive outpatient program (several hours a day, several days a week)  Offers depression assessments  Provides methadone maintenance program [phone] [address] [place], [place] partial hospitalization/day treatment and DUI/DWI programs  [employer], private insurance [phone] [id number] [place], [place] [address]  [place] include intensive outpatient program (several hours a day, several days a week), outpatient treatment, DUI/DWI services, family education  Also has some services specifically for [employer] transitional housing  [phone] [address] [place], [place] [address]     [place] Medicare, private pay, and private insurance [phone] [address], [place], [place] [address]  [facility]  Services include individual counseling, substance abuse intensive outpatient program (several hours a day, several days a week), day treatment  Blinda Leatherwood, Medicaid, private insurance [phone] [address], [place], [place] [address]  [facility]   Offers substance abuse intensive outpatient program (several hours a day, several days a week), partial hospitalization program [phone] [address] [place], [place] [address]  [phone] [address] [place], [place] [address]  [phone] [place], [place] [address]  [phone], [place], [place] [address]  [facility]  Individual counseling only  Accepts private insurance only [phone] [address] [place], [place] [address]  [facility]: Methadone Clinic  Methadone maintenance program [phone] [place], [place] [address]  [facility] providing substance abuse and mental health counseling  Accepts Medicaid, Medicare, private insurance  Offers sliding scale for uninsured [phone] [place], [place] in [place] individual counseling, and intensive  in-home services [phone] [address], [place], [place] [address]  [facility] [place] individual counseling, family counseling, group therapy, domestic violence counseling, consumer credit counseling  Accepts Medicare, Medicaid, private insurance  Offers sliding scale for uninsured [phone] [address] [place], [place] [address]  [phone] [facility], [address] [place], [place]  [facility]  Offers individual, family and group counseling  3 locations - [place], [place], and [place]  [place] [address] [place], [place] [address]  [address] [place], [place] [address]  [place], [place] [address]  [facility] [place] [facility]    Offers psychiatric assessment, 8-week Intensive Outpatient Program (several hours a day, several times a week, daytime or evenings), early recovery group, family Program, medication management  Private pay or private insurance only [phone], or  [phone] [address] [place], [place] [address]  [facility]  Offers individual, couples and family counseling  Accepts Medicaid, private insurance, and sliding scale for uninsured [phone] [address] [place], [place] [address]  Launa Flight, MD  Individual counseling  Private insurance [phone] [address] [place], [place] [address]  [facility]   Offers assessment, substance abuse treatment, and behavioral health treatment [phone] [place], [place] [address]  [place]  Individual counseling  Accepts private insurance [phone] [place], [place] [address]  [facility]  Individual counseling  Blinda Leatherwood, private insurance [phone] [place], [place] [address]  [place]    Offers intensive outpatient program (several  hours a day, several times a week)  Private pay, private insurance [phone] [place], [place]  Individual counseling  Medicare, private insurance [phone] [address] [place], [place] [address]  [place]    Offers intensive outpatient program (several hours a day, several times a week) and partial hospitalization program [phone] [place], [place] [address]  Letta Moynahan, MD  Individual counseling [phone] [address], [place], [place] [address]  [facility] counseling to individuals, couples, and families  Accepts Medicare and private insurance; offers sliding scale for uninsured [phone] [place], [place] [address]  [facility]  Christian counseling [phone] [address] [place], [place] [address]  [facility] [employer] crisis counseling, individual counseling, group therapy, in-home therapy, domestic violence services, day treatment, DWI services, Conservation officer, nature (CST), Assertive Community Treatment Team (ACTT), substance abuse Intensive Outpatient Program (several hours a day, several times a week)  2 locations - [place] and [place], [place] [address]  [phone] [address] [place], [place] [address]  [place] counseling and group therapy  [place] insurance, [place], [place] [phone] [address] [place], [place]  [facility]  Offers individual and family counseling  Offers LGBTQ services  Accepts private insurance and private pay [phone] [place], [place] [address]  [facility]    Offers individual counseling, group therapy, and outpatient detox  Accepts private insurance  [phone] [place], [place] Medicare, private insurance [phone] [address], [place], [place] [address]  [employer]  Individual counseling  Accepts Medicare, private insurance [phone] [address] [place], [place] [address]  [facility] [place] substance abuse Intensive Outpatient Program (several hours a day, several times a week) [phone], or [phone] [place], [facility]  Substance Abuse Treatment Adults The [employer] Ways 211 is a great source of information about community services available.  Access by dialing 2-1-1 from anywhere in [place], or by website -  CustodianSupply.fi.   (Updated 05/2015)  Crisis Assistance 24 hours a day   [facility]  24-hour crisis assistance: [place], [place]   [facility]  24-hour crisis [place], [place]   24-hour crisis assistance: [place], [place] Access to Care Line  24-hour crisis assistance; [phone] All   [facility]  24-hour crisis response line: 419-213-1565 All   Other Local Resources (Updated 05/2015)  Inpatient Behavioral Health/Residential Substance Abuse Treatment Programs   Services      Address and Phone Number  [place] ([place])   14-day residential rehabilitation  [phone] [address],  [place]  [facility] ([facility])    Detox - private pay only  14-day residential rehabilitation -  Medicaid, insurance, private pay only [phone], or [place], [place], [place] [address]   [place]  only  Multiple facilities [phone] admissions   [facility] ([place])   90-day program  Must be homeless to participate  [phone], or [place], [place] only [phone], or  [place], [place] [address]  [place]     Must make an appointment  Transportation is offered from [place] on [place].  Accepts private pay, [place] [facility] [phone] [place], [place] [address]   [employer]  Females only  Associated with the [place] [phone] [place], [place] [address]  [facility]   Private insurance only [phone], or [phone] [address] [place], [id number]  [place]    Detox  Residential rehabilitation  Private insurance only  Multiple locations [phone] admissions  [place] of [place] pay  Private insurance [phone] [address] [place] [address]  [facility]    Males only  Fee required at time of admission [place], [place] [address]  [place] [facility] [place] pay only  [phone] [address] [place], [place]  RTS (Residential Treatment Services)    Detox - private pay, Medicaid  Residential rehabilitation for males  - Medicare, Medicaid, insurance, private pay [phone] [place], [place] interviews Monday - Saturday from 8 am - 4 pm  Individuals with legal charges are not eligible [phone] [address] [place], [place] [address]  [facility]   Must be willing to work  Must attend Alcoholics Anonymous meetings [phone] [address] [place], [place]    Faith-based program  Private pay only [phone] [place], [place]

## 2015-09-04 NOTE — ED Provider Notes (Signed)
7:30 AM  Assumed care from Dr. Ronnald Ramp and Dr. Jeneen Rinks.  Pt is a 55 y.o. female with history of hearing impairment, alcohol abuse who presents to the emergency department after she was found unresponsive by her mother. Patient had a CT of her head and cervical spine which were unremarkable. Alcohol level was found to be 511. She had a mild metabolic acidosis and elevated lactate likely from dehydration from alcohol abuse. Lactate improved with IV hydration. On my reevaluation, patient denies any pain. No other sign of trauma other than scalp laceration.She is neurologically intact. She is eating and drinking without difficulty. She states that she has been in recovery but must have relapsed. She is supposed to see her therapist at 10 AM today. Her mother is at bedside to take her home. She appears clinically sober. We will annually patient in the emergency department but I feel she can be discharged. We'll give her outpatient follow-up resources. Have advised her not to operate machinery, drive a car for the next 24 hours given her significantly elevated alcohol level. Discussed with mother and patient strict return precautions. They verbalize understanding and are comfortable with this plan.   I reviewed all nursing notes, vitals, pertinent old records, EKGs, labs, imaging (as available).   [place], DO 09/04/15 [phone]

## 2015-09-16 ENCOUNTER — Emergency Department (HOSPITAL_COMMUNITY): Payer: Medicare Other

## 2015-09-16 ENCOUNTER — Encounter (HOSPITAL_COMMUNITY): Payer: Self-pay | Admitting: *Deleted

## 2015-09-16 ENCOUNTER — Emergency Department (HOSPITAL_COMMUNITY)
Admission: EM | Admit: 2015-09-16 | Discharge: 2015-09-16 | Disposition: A | Discharge status: Home / Self Care | Payer: Medicare Other | Attending: Emergency Medicine | Admitting: Emergency Medicine

## 2015-09-16 DIAGNOSIS — F329 Major depressive disorder, single episode, unspecified: Secondary | ICD-10-CM | POA: Insufficient documentation

## 2015-09-16 DIAGNOSIS — R112 Nausea with vomiting, unspecified: Secondary | ICD-10-CM

## 2015-09-16 DIAGNOSIS — W19XXXA Unspecified fall, initial encounter: Secondary | ICD-10-CM

## 2015-09-16 DIAGNOSIS — R55 Syncope and collapse: Secondary | ICD-10-CM

## 2015-09-16 DIAGNOSIS — E559 Vitamin D deficiency, unspecified: Secondary | ICD-10-CM | POA: Diagnosis not present

## 2015-09-16 DIAGNOSIS — Y9289 Other specified places as the place of occurrence of the external cause: Secondary | ICD-10-CM | POA: Insufficient documentation

## 2015-09-16 DIAGNOSIS — Z86711 Personal history of pulmonary embolism: Secondary | ICD-10-CM | POA: Diagnosis not present

## 2015-09-16 DIAGNOSIS — R197 Diarrhea, unspecified: Secondary | ICD-10-CM | POA: Diagnosis not present

## 2015-09-16 DIAGNOSIS — F419 Anxiety disorder, unspecified: Secondary | ICD-10-CM | POA: Insufficient documentation

## 2015-09-16 DIAGNOSIS — K219 Gastro-esophageal reflux disease without esophagitis: Secondary | ICD-10-CM | POA: Insufficient documentation

## 2015-09-16 DIAGNOSIS — A084 Viral intestinal infection, unspecified: Secondary | ICD-10-CM | POA: Diagnosis not present

## 2015-09-16 DIAGNOSIS — M810 Age-related osteoporosis without current pathological fracture: Secondary | ICD-10-CM | POA: Insufficient documentation

## 2015-09-16 DIAGNOSIS — Y9389 Activity, other specified: Secondary | ICD-10-CM | POA: Insufficient documentation

## 2015-09-16 DIAGNOSIS — Z792 Long term (current) use of antibiotics: Secondary | ICD-10-CM | POA: Diagnosis not present

## 2015-09-16 DIAGNOSIS — Z7982 Long term (current) use of aspirin: Secondary | ICD-10-CM | POA: Insufficient documentation

## 2015-09-16 DIAGNOSIS — R7989 Other specified abnormal findings of blood chemistry: Secondary | ICD-10-CM

## 2015-09-16 DIAGNOSIS — R945 Abnormal results of liver function studies: Secondary | ICD-10-CM

## 2015-09-16 DIAGNOSIS — M1990 Unspecified osteoarthritis, unspecified site: Secondary | ICD-10-CM | POA: Insufficient documentation

## 2015-09-16 DIAGNOSIS — M533 Sacrococcygeal disorders, not elsewhere classified: Secondary | ICD-10-CM

## 2015-09-16 DIAGNOSIS — Z79899 Other long term (current) drug therapy: Secondary | ICD-10-CM | POA: Diagnosis not present

## 2015-09-16 DIAGNOSIS — W1839XA Other fall on same level, initial encounter: Secondary | ICD-10-CM | POA: Insufficient documentation

## 2015-09-16 DIAGNOSIS — Y998 Other external cause status: Secondary | ICD-10-CM | POA: Diagnosis not present

## 2015-09-16 DIAGNOSIS — I10 Essential (primary) hypertension: Secondary | ICD-10-CM | POA: Insufficient documentation

## 2015-09-16 DIAGNOSIS — H9190 Unspecified hearing loss, unspecified ear: Secondary | ICD-10-CM | POA: Diagnosis not present

## 2015-09-16 DIAGNOSIS — G43909 Migraine, unspecified, not intractable, without status migrainosus: Secondary | ICD-10-CM | POA: Insufficient documentation

## 2015-09-16 DIAGNOSIS — R1013 Epigastric pain: Secondary | ICD-10-CM

## 2015-09-16 DIAGNOSIS — S34139A Unspecified injury to sacral spinal cord, initial encounter: Secondary | ICD-10-CM | POA: Diagnosis not present

## 2015-09-16 LAB — CBC WITH DIFFERENTIAL/PLATELET
BASOS ABS: 0 10*3/uL (ref 0.0–0.1)
BASOS PCT: 1 %
EOS PCT: 2 %
Eosinophils Absolute: 0.1 10*3/uL (ref 0.0–0.7)
HCT: 38.2 % (ref 36.0–46.0)
Hemoglobin: 13.2 g/dL (ref 12.0–15.0)
Lymphocytes Relative: 14 %
Lymphs Abs: 0.5 10*3/uL — ABNORMAL LOW (ref 0.7–4.0)
MCH: 34.8 pg — ABNORMAL HIGH (ref 26.0–34.0)
MCHC: 34.6 g/dL (ref 30.0–36.0)
MCV: 100.8 fL — ABNORMAL HIGH (ref 78.0–100.0)
MONO ABS: 0.3 10*3/uL (ref 0.1–1.0)
MONOS PCT: 9 %
Neutro Abs: 2.5 10*3/uL (ref 1.7–7.7)
Neutrophils Relative %: 74 %
PLATELETS: 181 10*3/uL (ref 150–400)
RBC: 3.79 MIL/uL — ABNORMAL LOW (ref 3.87–5.11)
RDW: 13.5 % (ref 11.5–15.5)
WBC: 3.3 10*3/uL — ABNORMAL LOW (ref 4.0–10.5)

## 2015-09-16 LAB — COMPREHENSIVE METABOLIC PANEL
ALBUMIN: 4.4 g/dL (ref 3.5–5.0)
ALK PHOS: 86 U/L (ref 38–126)
ALT: 62 U/L — AB (ref 14–54)
AST: 96 U/L — AB (ref 15–41)
Anion gap: 11 (ref 5–15)
BILIRUBIN TOTAL: 0.9 mg/dL (ref 0.3–1.2)
BUN: 13 mg/dL (ref 6–20)
CALCIUM: 10.7 mg/dL — AB (ref 8.9–10.3)
CO2: 29 mmol/L (ref 22–32)
CREATININE: 0.73 mg/dL (ref 0.44–1.00)
Chloride: 102 mmol/L (ref 101–111)
GFR calc Af Amer: >60 mL/min (ref >60)
GFR calc non Af Amer: >60 mL/min (ref >60)
GLUCOSE: 117 mg/dL — AB (ref 65–99)
Potassium: 4 mmol/L (ref 3.5–5.1)
Sodium: 142 mmol/L (ref 135–145)
Total Protein: 7.4 g/dL (ref 6.5–8.1)

## 2015-09-16 LAB — URINE MICROSCOPIC-ADD ON

## 2015-09-16 LAB — URINALYSIS, ROUTINE W REFLEX MICROSCOPIC
Glucose, UA: NEGATIVE mg/dL
Ketones, ur: 40 mg/dL — AB
Leukocytes, UA: NEGATIVE
Nitrite: NEGATIVE
PROTEIN: NEGATIVE mg/dL
Specific Gravity, Urine: 1.027 (ref 1.005–1.030)
pH: 7.5 (ref 5.0–8.0)

## 2015-09-16 LAB — I-STAT TROPONIN, ED: Troponin i, poc: 0 ng/mL (ref 0.00–0.08)

## 2015-09-16 LAB — LIPASE, BLOOD: Lipase: 45 U/L (ref 11–51)

## 2015-09-16 LAB — ETHANOL: Alcohol, Ethyl (B): <5 mg/dL (ref <5)

## 2015-09-16 LAB — CBG MONITORING, ED: GLUCOSE-CAPILLARY: 127 mg/dL — AB (ref 65–99)

## 2015-09-16 MED ORDER — HYDROCODONE-ACETAMINOPHEN 5-325 MG PO TABS: 1.0000 | ORAL_TABLET | Freq: Four times a day (QID) | ORAL | Status: DC | PRN

## 2015-09-16 MED ORDER — ONDANSETRON 8 MG PO TBDP: 8.0000 mg | ORAL_TABLET | Freq: Three times a day (TID) | ORAL | Status: DC | PRN

## 2015-09-16 MED ORDER — ONDANSETRON HCL 4 MG/2ML IJ SOLN
4.0000 mg | Freq: Once | INTRAMUSCULAR | Status: AC
  Administered 2015-09-16: 4 mg via INTRAVENOUS
  Filled 2015-09-16: qty 2

## 2015-09-16 MED ORDER — HYDROMORPHONE HCL 1 MG/ML IJ SOLN
1.0000 mg | Freq: Once | INTRAMUSCULAR | Status: AC
  Administered 2015-09-16: 1 mg via INTRAVENOUS
  Filled 2015-09-16: qty 1

## 2015-09-16 MED ORDER — SODIUM CHLORIDE 0.9 % IV BOLUS (SEPSIS)
1000.0000 mL | Freq: Once | INTRAVENOUS | Status: AC
  Administered 2015-09-16: 1000 mL via INTRAVENOUS

## 2015-09-16 MED ORDER — GI COCKTAIL ~~LOC~~
30.0000 mL | Freq: Once | ORAL | Status: AC
  Administered 2015-09-16: 30 mL via ORAL
  Filled 2015-09-16: qty 30

## 2015-09-16 MED ORDER — RANITIDINE HCL 150 MG PO TABS: 150.0000 mg | ORAL_TABLET | Freq: Two times a day (BID) | ORAL | Status: DC

## 2015-09-16 MED ORDER — MORPHINE SULFATE (PF) 4 MG/ML IV SOLN
4.0000 mg | Freq: Once | INTRAVENOUS | Status: AC
  Administered 2015-09-16: 4 mg via INTRAVENOUS
  Filled 2015-09-16: qty 1

## 2015-09-16 MED ORDER — PANTOPRAZOLE SODIUM 40 MG IV SOLR
40.0000 mg | Freq: Once | INTRAVENOUS | Status: AC
  Administered 2015-09-16: 40 mg via INTRAVENOUS
  Filled 2015-09-16: qty 40

## 2015-09-16 NOTE — ED Notes (Signed)
Pt reports she has been having vomiting and syncopal episode this am.  States everytime she vomits she has syncope or near syncope each time.  Pt also reports she fell and landed on her bottom x 2-3 days ago.  Pt reports pain in her sacral area and is worse when ambulating.

## 2015-09-16 NOTE — Discharge Instructions (Signed)
Use zofran as prescribed, as needed for nausea. Use tylenol or norco as directed as needed for pain but don't drive while taking norco. Use zantac as directed to help protect the lining of your stomach. Avoid NSAIDs like ibuprofen and aleve/motrin/etc. Avoid spicy/fatty/fried foods, avoid acidic foods, avoid coffee. You can use over the counter tums, maalox, or pepto bismol as needed for additional relief of symptoms. Stay well hydrated with small sips of fluids throughout the day. Follow a BRAT (banana-rice-applesauce-toast) diet as described below for the next 24-48 hours. The 'BRAT' diet is suggested, then progress to diet as tolerated as symptoms abate. Call your regular doctor if bloody stools, persistent diarrhea, vomiting, fever or abdominal pain. Follow up with your regular doctor in 1 week for recheck of symptoms. Return to ER for changing or worsening of symptoms.  Use ice or heat on your tailbone to help with the pain. Use pain medications as directed for pain control.  Food Choices to Help Relieve Diarrhea When you have diarrhea, the foods you eat and your eating habits are very important. Choosing the right foods and drinks can help relieve diarrhea. Also, because diarrhea can last up to 7 days, you need to replace lost fluids and electrolytes (such as sodium, potassium, and chloride) in order to help prevent dehydration.  WHAT GENERAL GUIDELINES DO I NEED TO FOLLOW?  Slowly drink 1 cup (8 oz) of fluid for each episode of diarrhea. If you are getting enough fluid, your urine will be clear or pale yellow.  Eat starchy foods. Some good choices include white rice, white toast, pasta, low-fiber cereal, baked potatoes (without the skin), saltine crackers, and bagels.  Avoid large servings of any cooked vegetables.  Limit fruit to two servings per day. A serving is  cup or 1 small piece.  Choose foods with less than 2 g of fiber per serving.  Limit fats to less than 8 tsp (38 g) per  day.  Avoid fried foods.  Eat foods that have probiotics in them. Probiotics can be found in certain dairy products.  Avoid foods and beverages that may increase the speed at which food moves through the stomach and intestines (gastrointestinal tract). Things to avoid include:  High-fiber foods, such as dried fruit, raw fruits and vegetables, nuts, seeds, and whole grain foods.  Spicy foods and high-fat foods.  Foods and beverages sweetened with high-fructose corn syrup, honey, or sugar alcohols such as xylitol, sorbitol, and mannitol. WHAT FOODS ARE RECOMMENDED? Grains White rice. White, [place], or pita breads (fresh or toasted), including plain rolls, buns, or bagels. White pasta. Saltine, soda, or graham crackers. Pretzels. Low-fiber cereal. Cooked cereals made with water (such as cornmeal, farina, or cream cereals). Plain muffins. Matzo. Melba toast. Zwieback.  Vegetables Potatoes (without the skin). Strained tomato and vegetable juices. Most well-cooked and canned vegetables without seeds. Tender lettuce. Fruits Cooked or canned applesauce, apricots, cherries, fruit cocktail, grapefruit, peaches, pears, or plums. Fresh bananas, apples without skin, cherries, grapes, cantaloupe, grapefruit, peaches, oranges, or plums.  Meat and Other Protein Products Baked or boiled chicken. Eggs. Tofu. Fish. Seafood. Smooth peanut butter. Ground or well-cooked tender beef, ham, veal, lamb, pork, or poultry.  Dairy Plain yogurt, kefir, and unsweetened liquid yogurt. Lactose-free milk, buttermilk, or soy milk. Plain hard cheese. Beverages Sport drinks. Clear broths. Diluted fruit juices (except prune). Regular, caffeine-free sodas such as ginger ale. Water. Decaffeinated teas. Oral rehydration solutions. Sugar-free beverages not sweetened with sugar alcohols. Other Bouillon, broth, or soups made  from recommended foods.  The items listed above may not be a complete list of recommended foods or  beverages. Contact your dietitian for more options. WHAT FOODS ARE NOT RECOMMENDED? Grains Whole grain, whole wheat, bran, or rye breads, rolls, pastas, crackers, and cereals. Wild or brown rice. Cereals that contain more than 2 g of fiber per serving. Corn tortillas or taco shells. Cooked or dry oatmeal. Granola. Popcorn. Vegetables Raw vegetables. Cabbage, broccoli, Brussels sprouts, artichokes, baked beans, beet greens, corn, kale, legumes, peas, sweet potatoes, and yams. Potato skins. Cooked spinach and cabbage. Fruits Dried fruit, including raisins and dates. Raw fruits. Stewed or dried prunes. Fresh apples with skin, apricots, mangoes, pears, raspberries, and strawberries.  Meat and Other Protein Products Chunky peanut butter. Nuts and seeds. Beans and lentils. Berniece Salines.  Dairy High-fat cheeses. Milk, chocolate milk, and beverages made with milk, such as milk shakes. Cream. Ice cream. Sweets and Desserts Sweet rolls, doughnuts, and sweet breads. Pancakes and waffles. Fats and Oils Butter. Cream sauces. Margarine. Salad oils. Plain salad dressings. Olives. Avocados.  Beverages Caffeinated beverages (such as coffee, tea, soda, or energy drinks). Alcoholic beverages. Fruit juices with pulp. Prune juice. Soft drinks sweetened with high-fructose corn syrup or sugar alcohols. Other Coconut. Hot sauce. Chili powder. Mayonnaise. Gravy. Cream-based or milk-based soups.  The items listed above may not be a complete list of foods and beverages to avoid. Contact your dietitian for more information. WHAT SHOULD I DO IF I BECOME DEHYDRATED? Diarrhea can sometimes lead to dehydration. Signs of dehydration include dark urine and dry mouth and skin. If you think you are dehydrated, you should rehydrate with an oral rehydration solution. These solutions can be purchased at pharmacies, retail stores, or online.  Drink -1 cup (120-240 mL) of oral rehydration solution each time you have an episode of diarrhea.  If drinking this amount makes your diarrhea worse, try drinking smaller amounts more often. For example, drink 1-3 tsp (5-15 mL) every 5-10 minutes.  A general rule for staying hydrated is to drink 1-2 L of fluid per day. Talk to your health care provider about the specific amount you should be drinking each day. Drink enough fluids to keep your urine clear or pale yellow. Document Released: 07/17/2003 Document Revised: 05/01/2013 Document Reviewed: 03/19/2013 [facility] Patient Information 2015 [place], [place]. This information is not intended to replace advice given to you by your health care provider. Make sure you discuss any questions you have with your health care provider.   Viral Gastroenteritis Viral gastroenteritis is also called stomach flu. This illness is caused by a certain type of germ (virus). It can cause sudden watery poop (diarrhea) and throwing up (vomiting). This can cause you to lose body fluids (dehydration). This illness usually lasts for 3 to 8 days. It usually goes away on its own. HOME CARE   Drink enough fluids to keep your pee (urine) clear or pale yellow. Drink small amounts of fluids often.  Ask your doctor how to replace body fluid losses (rehydration).  Avoid:  Foods high in sugar.  Alcohol.  Bubbly (carbonated) drinks.  Tobacco.  Juice.  Caffeine drinks.  Very hot or cold fluids.  Fatty, greasy foods.  Eating too much at one time.  Dairy products until 24 to 48 hours after your watery poop stops.  You may eat foods with active cultures (probiotics). They can be found in some yogurts and supplements.  Wash your hands well to avoid spreading the illness.  Only take medicines as  told by your doctor. Do not give aspirin to children. Do not take medicines for watery poop (antidiarrheals).  Ask your doctor if you should keep taking your regular medicines.  Keep all doctor visits as told. GET HELP RIGHT AWAY IF:   You cannot keep fluids  down.  You do not pee at least once every 6 to 8 hours.  You are short of breath.  You see blood in your poop or throw up. This may look like coffee grounds.  You have belly (abdominal) pain that gets worse or is just in one small spot (localized).  You keep throwing up or having watery poop.  You have a fever.  The patient is a child younger than 3 months, and he or she has a fever.  The patient is a child older than 3 months, and he or she has a fever and problems that do not go away.  The patient is a child older than 3 months, and he or she has a fever and problems that suddenly get worse.  The patient is a baby, and he or she has no tears when crying. MAKE SURE YOU:   Understand these instructions.  Will watch your condition.  Will get help right away if you are not doing well or get worse.   This information is not intended to replace advice given to you by your health care provider. Make sure you discuss any questions you have with your health care provider.   Document Released: 10/13/2007 Document Revised: 07/19/2011 Document Reviewed: 02/10/2011 Elsevier Interactive Patient Education 2016 [place] Injury The tailbone (coccyx) is the small bone at the lower end of the spine. A tailbone injury may involve stretched ligaments, bruising, or a broken bone (fracture). Tailbone injuries can be painful, and some may take a long time to heal. CAUSES This condition is often caused by falling and landing on the tailbone. Other causes include:  Repeated strain or friction from actions such as rowing and bicycling.  Childbirth. In some cases, the cause may not be known. RISK FACTORS This condition is more common in women than in men. SYMPTOMS Symptoms of this condition include:  Pain in the lower back, especially when sitting.  Pain or difficulty when standing up from a sitting position.  Bruising in the tailbone area.  Painful bowel movements.  In  women, pain during intercourse. DIAGNOSIS This condition may be diagnosed based on your symptoms and a physical exam. X-rays may be taken if a fracture is suspected. You may also have other tests, such as a CT scan or MRI. TREATMENT This condition may be treated with medicines to help relieve your pain. Most tailbone injuries heal on their own in 4-6 weeks. However, recovery time may be longer if the injury involves a fracture. HOME CARE INSTRUCTIONS  Take medicines only as directed by your health care provider.  If directed, apply ice to the injured area:  Put ice in a plastic bag.  Place a towel between your skin and the bag.  Leave the ice on for 20 minutes, 2-3 times per day for the first 1-2 days.  Sit on a large, rubber or inflated ring or cushion to ease your pain. Lean forward when you are sitting to help decrease discomfort.  Avoid sitting for long periods of time.  Increase your activity as the pain allows. Perform any exercises that are recommended by your health care provider or physical therapist.  If you have pain during bowel  movements, use stool softeners as directed by your health care provider.  Eat a diet that includes plenty of fiber to help prevent constipation.  Keep all follow-up visits as directed by your health care provider. This is important. PREVENTION Wear appropriate padding and sports gear when bicycling and rowing. This can help to prevent developing an injury that is caused by repeated strain or friction. SEEK MEDICAL CARE IF:  Your pain becomes worse.  Your bowel movements cause a great deal of discomfort.  You are unable to have a bowel movement.  You have uncontrolled urine loss (urinary incontinence).  You have a fever.   This information is not intended to replace advice given to you by your health care provider. Make sure you discuss any questions you have with your health care provider.   Document Released: 04/23/2000 Document  Revised: 09/10/2014 Document Reviewed: 04/22/2014 Elsevier Interactive Patient Education 2016 [employer]  Nausea and Vomiting Nausea means you feel sick to your stomach. Throwing up (vomiting) is a reflex where stomach contents come out of your mouth. HOME CARE   Take medicine as told by your doctor.  Do not force yourself to eat. However, you do need to drink fluids.  If you feel like eating, eat a normal diet as told by your doctor.  Eat rice, wheat, potatoes, bread, lean meats, yogurt, fruits, and vegetables.  Avoid high-fat foods.  Drink enough fluids to keep your pee (urine) clear or pale yellow.  Ask your doctor how to replace body fluid losses (rehydrate). Signs of body fluid loss (dehydration) include:  Feeling very thirsty.  Dry lips and mouth.  Feeling dizzy.  Dark pee.  Peeing less than normal.  Feeling confused.  Fast breathing or heart rate. GET HELP RIGHT AWAY IF:   You have blood in your throw up.  You have black or bloody poop (stool).  You have a bad headache or stiff neck.  You feel confused.  You have bad belly (abdominal) pain.  You have chest pain or trouble breathing.  You do not pee at least once every 8 hours.  You have cold, clammy skin.  You keep throwing up after 24 to 48 hours.  You have a fever. MAKE SURE YOU:   Understand these instructions.  Will watch your condition.  Will get help right away if you are not doing well or get worse.   This information is not intended to replace advice given to you by your health care provider. Make sure you discuss any questions you have with your health care provider.   Document Released: 10/13/2007 Document Revised: 07/19/2011 Document Reviewed: 09/25/2010 Elsevier Interactive Patient Education 2016 [place].  Diarrhea Diarrhea is watery poop (stool). It can make you feel weak, tired, thirsty, or give you a dry mouth (signs of dehydration). Watery poop is a sign of another  problem, most often an infection. It often lasts 2-3 days. It can last longer if it is a sign of something serious. Take care of yourself as told by your doctor. HOME CARE   Drink 1 cup (8 ounces) of fluid each time you have watery poop.  Do not drink the following fluids:  Those that contain simple sugars (fructose, glucose, galactose, lactose, sucrose, maltose).  Sports drinks.  Fruit juices.  Whole milk products.  Sodas.  Drinks with caffeine (coffee, tea, soda) or alcohol.  Oral rehydration solution may be used if the doctor says it is okay. You may make your own solution. Follow this  recipe:   - teaspoon table salt.   teaspoon baking soda.   teaspoon salt substitute containing potassium chloride.  1 tablespoons sugar.  1 liter (34 ounces) of water.  Avoid the following foods:  High fiber foods, such as raw fruits and vegetables.  Nuts, seeds, and whole grain breads and cereals.   Those that are sweetened with sugar alcohols (xylitol, sorbitol, mannitol).  Try eating the following foods:  Starchy foods, such as rice, toast, pasta, low-sugar cereal, oatmeal, baked potatoes, crackers, and bagels.  Bananas.  Applesauce.  Eat probiotic-rich foods, such as yogurt and milk products that are fermented.  Wash your hands well after each time you have watery poop.  Only take medicine as told by your doctor.  Take a warm bath to help lessen burning or pain from having watery poop. GET HELP RIGHT AWAY IF:   You cannot drink fluids without throwing up (vomiting).  You keep throwing up.  You have blood in your poop, or your poop looks black and tarry.  You do not pee (urinate) in 6-8 hours, or there is only a small amount of very dark pee.  You have belly (abdominal) pain that gets worse or stays in the same spot (localizes).  You are weak, dizzy, confused, or light-headed.  You have a very bad headache.  Your watery poop gets worse or does not get  better.  You have a fever or lasting symptoms for more than 2-3 days.  You have a fever and your symptoms suddenly get worse. MAKE SURE YOU:   Understand these instructions.  Will watch your condition.  Will get help right away if you are not doing well or get worse.   This information is not intended to replace advice given to you by your health care provider. Make sure you discuss any questions you have with your health care provider.   Document Released: 10/13/2007 Document Revised: 05/17/2014 Document Reviewed: 01/02/2012 Elsevier Interactive Patient Education 2016 [employer] Inc[employer]  Abdominal Pain, Adult Many things can cause belly (abdominal) pain. Most times, the belly pain is not dangerous. Many cases of belly pain can be watched and treated at home. HOME CARE   Do not take medicines that help you go poop (laxatives) unless told to by your doctor.  Only take medicine as told by your doctor.  Eat or drink as told by your doctor. Your doctor will tell you if you should be on a special diet. GET HELP IF:  You do not know what is causing your belly pain.  You have belly pain while you are sick to your stomach (nauseous) or have runny poop (diarrhea).  You have pain while you pee or poop.  Your belly pain wakes you up at night.  You have belly pain that gets worse or better when you eat.  You have belly pain that gets worse when you eat fatty foods.  You have a fever. GET HELP RIGHT AWAY IF:   The pain does not go away within 2 hours.  You keep throwing up (vomiting).  The pain changes and is only in the right or left part of the belly.  You have bloody or tarry looking poop. MAKE SURE YOU:   Understand these instructions.  Will watch your condition.  Will get help right away if you are not doing well or get worse.   This information is not intended to replace advice given to you by your health care provider. Make sure you discuss any  questions you have with  your health care provider.   Document Released: 10/13/2007 Document Revised: 05/17/2014 Document Reviewed: 01/03/2013 Elsevier Interactive Patient Education [employer].

## 2015-09-16 NOTE — ED Provider Notes (Signed)
CSN: [id number]     Arrival date & time 09/16/15  [id number] History   First MD Initiated Contact with Patient 09/16/15 1105     Chief Complaint  Patient presents with  . Loss of Consciousness  . Emesis     (Consider location/radiation/quality/duration/timing/severity/associated sxs/prior Treatment) HPI Comments: [name] is a 55 y.o. female with a PMHx of congenital deafness, alcohol abuse, fibromyalgia, migraines, depression, cardiomyopathy, gastritis, GERD, HTN, and anemia, and a PSHx of appendectomy, cholecystectomy, and cochlear implant, who presents to the ED with complaints of nausea, vomiting, diarrhea, and epigastric abdominal pain that began around 6:30 AM. She reports 4 episodes of nonbloody nonbilious emesis and 3 episodes of nonbloody diarrhea this morning, and while she was vomiting she felt lightheaded like she was going to pass out. Denies any actual syncopal episodes. Additionally she reports that on Friday 4 days ago she was trying to get up off the chair and fell onto her buttocks on the ground, went to her primary care doctor but there x-ray machine was not working so she was unable to get an x-ray of her lower back/buttocks. She reports the pain today is 9/10 constant sharp pain in the right butt cheek and tailbone, nonradiating, worse with movement, and unrelieved with Tylenol. She denies head injury or loss of consciousness on Friday either.  She denies any fevers, chills, chest pain, shortness breath, hematemesis, melena, hematochezia, obstipation, constipation, dysuria, hematuria, numbness, tingling, weakness, incontinence of urine or stool, saddle anesthesia or 1 symptoms, recent travel, recent antibiotics, sick contacts, suspicious food intake, or chronic NSAID use. She does admit to drinking alcohol intermittently, she has not had any in the last 1.5 weeks. Chart review reveals she was here for EtOH intoxication on 09/03/15, ethanol level [id number], slight metabolic acidosis seen that  day-- she had a fall that day but CT head/neck was negative.   Patient is a 55 y.o. female presenting with vomiting. The history is provided by the patient and medical records. No language interpreter was used.  Emesis Severity:  Moderate Duration:  5 hours Timing:  Constant Number of daily episodes:  4x Quality:  Stomach contents Progression:  Unchanged Chronicity:  New Recent urination:  Normal Relieved by:  None tried Worsened by:  Nothing tried Ineffective treatments:  None tried Associated symptoms: abdominal pain and diarrhea   Associated symptoms: no arthralgias, no chills and no myalgias   Risk factors: alcohol use (none in last 1.5wks) and prior abdominal surgery   Risk factors: no sick contacts, no suspect food intake and no travel to endemic areas     Past Medical History  Diagnosis Date  . Depression   . Migraine   . Fibromyalgia   . Hearing impairment   . Arthritis   . Cardiomyopathy   . Pulmonary embolism ([place])   . Gastritis   . Osteoporosis   . Anemia   . Allergy     SEASONAL  . Anxiety   . GERD (gastroesophageal reflux disease)   . Hypertension     [name], take htn medication to regulate heart beat.  . Sleep apnea   . Vitamin D deficiency   . Deaf    Past Surgical History  Procedure Laterality Date  . Appendectomy    . Inner ear surgery      TUBES  . Tonsillectomy and adenoidectomy    . Tubal ligation    . Laparoscopic cholecystectomy  2012  . Cardiac surgery  January 2007    mitral valve  repair  . Cardiac surgery  1973    atrial septal defect  . Cochlear implant     Family History  Problem Relation Age of Onset  . Breast cancer Mother     bilateral; ages 63 and 46; TAH/BSO ~50  . Depression Sister   . Heart disease Father   . Hypertension Father   . Colon cancer Neg Hx   . Esophageal cancer Neg Hx   . Pancreatic cancer Neg Hx   . Rectal cancer Neg Hx   . Stomach cancer Neg Hx    Social History  Substance Use Topics  . Smoking  status: Never Smoker   . Smokeless tobacco: None  . Alcohol Use: No   OB History    Gravida Para Term Preterm AB TAB SAB Ectopic Multiple Living   2 2 2  0 0 0 0 0       Review of Systems  Constitutional: Negative for fever and chills.  Respiratory: Negative for shortness of breath.   Cardiovascular: Positive for syncope. Negative for chest pain.  Gastrointestinal: Positive for nausea, vomiting, abdominal pain and diarrhea. Negative for constipation and blood in stool.  Genitourinary: Negative for dysuria and hematuria.  Musculoskeletal: Positive for back pain (coccyx/R buttock). Negative for myalgias and arthralgias.  Skin: Negative for color change and wound.  Allergic/Immunologic: Negative for immunocompromised state.  Neurological: Positive for light-headedness (with vomiting). Negative for syncope, weakness and numbness.  Psychiatric/Behavioral: Negative for confusion.   10 Systems reviewed and are negative for acute change except as noted in the HPI.    Allergies  Depakote; Valium; and Diazepam  Home Medications   Prior to Admission medications   Medication Sig Start Date End Date Taking? Authorizing Provider  acetaminophen (TYLENOL) 500 MG tablet Take 1,000 mg by mouth every 6 (six) hours as needed for mild pain, moderate pain or headache.     Historical Provider, MD  aspirin 81 MG chewable tablet Chew 81 mg by mouth daily.    Historical Provider, MD  carvedilol (COREG) 25 MG tablet TAKE 1 TABLET BY MOUTH TWICE A DAY WITH MEALS (APPOINTMENT NEEDED FOR ADDITIONAL REFILLS) 07/11/15   Jerline Pain, MD  Cholecalciferol (VITAMIN D-3 PO) Take 2 capsules by mouth daily.    Historical Provider, MD  Cyanocobalamin (VITAMIN B-12 PO) Take 1 tablet by mouth daily.    Historical Provider, MD  Ferrous Sulfate (IRON) 142 (45 Fe) MG TBCR Take 1 tablet by mouth daily.    Historical Provider, MD  fexofenadine (ALLEGRA) 180 MG tablet Take 180 mg by mouth daily.    Historical Provider, MD    gabapentin (NEURONTIN) 100 MG capsule Take 100 mg by mouth daily as needed (headache).     Historical Provider, MD  gabapentin (NEURONTIN) 600 MG tablet Take 600 mg by mouth at bedtime.    Historical Provider, MD  hydrochlorothiazide (MICROZIDE) 12.5 MG capsule Take 1 capsule (12.5 mg total) by mouth every morning. 05/16/14   Jerline Pain, MD  HYDROcodone-acetaminophen (NORCO/VICODIN) 5-325 MG tablet Take 1 tablet by mouth every 4 (four) hours as needed for moderate pain or severe pain. 07/17/15   Clayton Bibles, PA-C  mirabegron ER (MYRBETRIQ) 50 MG TB24 tablet Take 1 tablet (50 mg total) by mouth daily. 07/17/14   Terrance Mass, MD  Multiple Vitamin (MULTIVITAMIN WITH MINERALS) TABS tablet Take 1 tablet by mouth daily.    Historical Provider, MD  Omega-3 Fatty Acids (FISH OIL) 1000 MG CAPS Take 1 capsule by mouth  2 (two) times daily.     Historical Provider, MD  ondansetron (ZOFRAN ODT) 8 MG disintegrating tablet Take 1 tablet (8 mg total) by mouth every 8 (eight) hours as needed for nausea or vomiting. 04/11/15   Tatyana Kirichenko, PA-C  oxyCODONE-acetaminophen (PERCOCET) 5-325 MG tablet Take 1 tablet by mouth every 6 (six) hours as needed. Patient not taking: Reported on 04/11/2015 03/29/15   Davonna Belling, MD  pantoprazole (PROTONIX) 40 MG tablet TAKE 1 TABLET (40 MG TOTAL) BY MOUTH DAILY. 03/29/15   Davonna Belling, MD  penicillin v potassium (VEETID) 500 MG tablet Take 1 tablet (500 mg total) by mouth 4 (four) times daily. 07/17/15   Clayton Bibles, PA-C  prazosin (MINIPRESS) 2 MG capsule Take 2 mg by mouth at bedtime. 07/07/15   Historical Provider, MD  VIIBRYD 40 MG TABS Take 40 mg by mouth every morning. 07/07/15   Historical Provider, MD   BP 106/66 mmHg  Pulse 82  Temp(Src) 98.5 F (36.9 C) (Oral)  Resp 16  Ht 5\' 4"  (1.626 m)  Wt 67.586 kg  BMI 25.56 kg/m2  SpO2 98% Physical Exam  Constitutional: She is oriented to person, place, and time. Vital signs are normal. She appears  well-developed and well-nourished.  Non-toxic appearance. No distress.  Afebrile, nontoxic, NAD  HENT:  Head: Normocephalic and atraumatic.  Mouth/Throat: Oropharynx is clear and moist. Mucous membranes are dry.  Dry mucous membranes  Eyes: Conjunctivae and EOM are normal. Right eye exhibits no discharge. Left eye exhibits no discharge.  Neck: Normal range of motion. Neck supple.  Cardiovascular: Normal rate, regular rhythm, normal heart sounds and intact distal pulses.  Exam reveals no gallop and no friction rub.   No murmur heard. RRR, nl s1/s2, no m/r/g, distal pulses intact  Pulmonary/Chest: Effort normal and breath sounds normal. No respiratory distress. She has no decreased breath sounds. She has no wheezes. She has no rhonchi. She has no rales.  Abdominal: Soft. Normal appearance and bowel sounds are normal. She exhibits no distension. There is tenderness in the right upper quadrant and epigastric area. There is no rigidity, no rebound, no guarding, no CVA tenderness, no tenderness at McBurney's point and negative Murphy's sign.    Soft, nondistended, +BS throughout, with moderate RUQ/epigastric TTP, no r/g/r, neg murphy's, neg mcburney's, no CVA TTP   Musculoskeletal: Normal range of motion.       Lumbar back: She exhibits tenderness and bony tenderness. She exhibits normal range of motion, no deformity and no spasm.       Back:  Coccyx/sacrum region with FROM intact with mild midline coccyx TTP extending toward the R buttock, no bony stepoffs or deformities, no paraspinous muscle TTP or muscle spasms. Strength 5/5 in all extremities, sensation grossly intact in all extremities. No overlying skin changes, no bruising noted. Distal pulses intact.  MAE x4  Neurological: She is alert and oriented to person, place, and time. She has normal strength. No sensory deficit.  Skin: Skin is warm, dry and intact. No rash noted.  Psychiatric: She has a normal mood and affect.  Nursing note and  vitals reviewed.   ED Course  Procedures (including critical care time) Labs Review Labs Reviewed  URINALYSIS, ROUTINE W REFLEX MICROSCOPIC (NOT AT [facility]) - Abnormal; Notable for the following:    Color, Urine AMBER (*)    APPearance CLOUDY (*)    Hgb urine dipstick SMALL (*)    Bilirubin Urine SMALL (*)    Ketones, ur 40 (*)  All other components within normal limits  COMPREHENSIVE METABOLIC PANEL - Abnormal; Notable for the following:    Glucose, Bld 117 (*)    Calcium 10.7 (*)    AST 96 (*)    ALT 62 (*)    All other components within normal limits  CBC WITH DIFFERENTIAL/PLATELET - Abnormal; Notable for the following:    WBC 3.3 (*)    RBC 3.79 (*)    MCV 100.8 (*)    MCH 34.8 (*)    Lymphs Abs 0.5 (*)    All other components within normal limits  URINE MICROSCOPIC-ADD ON - Abnormal; Notable for the following:    Squamous Epithelial / LPF 6-30 (*)    Bacteria, UA FEW (*)    All other components within normal limits  CBG MONITORING, ED - Abnormal; Notable for the following:    Glucose-Capillary 127 (*)    All other components within normal limits  ETHANOL  LIPASE, BLOOD  I-STAT TROPOININ, ED    Imaging Review Dg Sacrum/coccyx  09/16/2015  CLINICAL DATA:  Fall 4 days ago with coccyx tenderness. Initial encounter. EXAM: SACRUM AND COCCYX - 2+ VIEW COMPARISON:  Abdominal CT 03/29/2015 FINDINGS: No evidence of acute fracture. No sacroiliac diastasis. Sacral coccygeal alignment is stable from prior CT sagittal reformats. Transitional lumbosacral vertebra with articulation between the left transverse process and sacrum. IMPRESSION: No acute finding. Electronically Signed   By: Monte Fantasia M.D.   On: 09/16/2015 12:12   I have personally reviewed and evaluated these images and lab results as part of my medical decision-making.   EKG Interpretation   Date/Time:  Tuesday Sep 16 2015 11:10:07 EDT Ventricular Rate:  80 PR Interval:  145 QRS Duration: 88 QT Interval:   394 QTC Calculation: 454 R Axis:   60 Text Interpretation:  Sinus rhythm Nonspecific T abnormalities, anterior  leads No significant change since last tracing Confirmed by NGUYEN, EMILY  [phone]) on 09/16/2015 11:29:34 AM      MDM   Final diagnoses:  Nausea vomiting and diarrhea  Epigastric abdominal pain  Viral gastroenteritis  Vasovagal near-syncope  Coccyx pain  Fall, initial encounter  Elevated LFTs    55 y.o. female here with nausea vomiting diarrhea and epigastric abdominal pain that began this morning, some lightheadedness while she vomits although no LOC. She also reports that she had a mechanical fall 4 days ago and fell on her buttocks and has had continuing pain, was seen by her PCP but there x-ray machine was not working so she was unable to have an x-ray done. On exam, tenderness to the coccyx and right buttocks, will obtain x-rays. All extremities neurovascularly intact. Otherwise, some epigastric and right upper quadrant abdominal tenderness, negative Murphy's and patient has no gallbladder therefore doubt GB etiology, likely viral gastroenteritis. Will obtain labs, trop, U/A, and give protonix, zofran, fluids, morphine, and GI cocktail. EKG appears unchanged from prior. Presyncope likely from the vasovagal response from retching. Doubt need for abdominal imaging at this time. Will also get EtOh level since she was recently here for alcohol intoxication and fall, although she denies drinking since then I want to ensure this is not a source of her symptoms today. Will reassess shortly  3:39 PM Pain still persisted, given dilaudid with relief. Tolerating PO well. U/A neg for infection or other concerning findings. Trop neg, EtOH neg, Lipase NWL. CMP with mildly elevated LFTs likely from vomiting/dehydration or alcohol use. CBC WNL. Coccyx/sacrum xray neg. Overall her symptoms are likely  from gastroenteritis/gastritis. OTC meds discussed, BRAT diet discussed, will give norco, zantac,  and zofran for home to help with symptoms. F/up with PCP in 1wk. I explained the diagnosis and have given explicit precautions to return to the ER including for any other new or worsening symptoms. The patient understands and accepts the medical plan as it's been dictated and I have answered their questions. Discharge instructions concerning home care and prescriptions have been given. The patient is STABLE and is discharged to home in good condition.  BP 133/88 mmHg  Pulse 78  Temp(Src) 98.2 F (36.8 C) (Oral)  Resp 22  Ht 5\' 4"  (1.626 m)  Wt 67.586 kg  BMI 25.56 kg/m2  SpO2 96%  Meds ordered this encounter  Medications  . ondansetron (ZOFRAN) injection 4 mg    Sig:   . morphine 4 MG/ML injection 4 mg    Sig:   . sodium chloride 0.9 % bolus 1,000 mL    Sig:   . pantoprazole (PROTONIX) injection 40 mg    Sig:   . gi cocktail (Maalox,Lidocaine,Donnatal)    Sig:   . HYDROmorphone (DILAUDID) injection 1 mg    Sig:   . ondansetron (ZOFRAN ODT) 8 MG disintegrating tablet    Sig: Take 1 tablet (8 mg total) by mouth every 8 (eight) hours as needed for nausea or vomiting.    Dispense:  10 tablet    Refill:  0    Order Specific Question:  Supervising Provider    Answer:  Sabra Heck, BRIAN [[id number]]  . HYDROcodone-acetaminophen (NORCO) 5-325 MG tablet    Sig: Take 1 tablet by mouth every 6 (six) hours as needed for severe pain.    Dispense:  10 tablet    Refill:  0    Order Specific Question:  Supervising Provider    Answer:  Sabra Heck, BRIAN [[id number]]  . ranitidine (ZANTAC) 150 MG tablet    Sig: Take 1 tablet (150 mg total) by mouth 2 (two) times daily.    Dispense:  30 tablet    Refill:  0    Order Specific Question:  Supervising Provider    Answer:  Noemi Chapel [[id number]]       Alfons Sulkowski Camprubi-Soms, PA-C 09/16/15 1545  Harvel Quale, MD 09/18/15 1657

## 2015-09-16 NOTE — ED Notes (Signed)
Patient called twice with no response.

## 2015-09-23 ENCOUNTER — Telehealth: Payer: Self-pay | Admitting: Gynecology

## 2015-09-23 DIAGNOSIS — M810 Age-related osteoporosis without current pathological fracture: Secondary | ICD-10-CM

## 2015-09-23 NOTE — Telephone Encounter (Addendum)
Dr Toney Rakes  patient is due for Reclast  10/04/15. She did not come to see you for annual exam due in 06/2015. We need to schedule her for a complete exam at that time we'll check her calcium level before the Reclast note from Dr Toney Rakes.I will ask Rosemarie Ax to call her.  Order for BUN , Calcium and Creatinine placed in EPIC

## 2015-09-25 ENCOUNTER — Emergency Department (HOSPITAL_COMMUNITY)
Admission: EM | Admit: 2015-09-25 | Discharge: 2015-09-25 | Disposition: A | Discharge status: Home / Self Care | Payer: Medicare Other | Attending: Emergency Medicine | Admitting: Emergency Medicine

## 2015-09-25 ENCOUNTER — Emergency Department (HOSPITAL_COMMUNITY): Payer: Medicare Other

## 2015-09-25 ENCOUNTER — Encounter (HOSPITAL_COMMUNITY): Payer: Self-pay | Admitting: *Deleted

## 2015-09-25 DIAGNOSIS — K2970 Gastritis, unspecified, without bleeding: Secondary | ICD-10-CM | POA: Diagnosis not present

## 2015-09-25 DIAGNOSIS — I10 Essential (primary) hypertension: Secondary | ICD-10-CM | POA: Diagnosis not present

## 2015-09-25 DIAGNOSIS — M25551 Pain in right hip: Secondary | ICD-10-CM | POA: Insufficient documentation

## 2015-09-25 DIAGNOSIS — R1013 Epigastric pain: Secondary | ICD-10-CM | POA: Diagnosis present

## 2015-09-25 DIAGNOSIS — Z7982 Long term (current) use of aspirin: Secondary | ICD-10-CM | POA: Diagnosis not present

## 2015-09-25 DIAGNOSIS — S79911A Unspecified injury of right hip, initial encounter: Secondary | ICD-10-CM | POA: Diagnosis not present

## 2015-09-25 LAB — CBC
HCT: 36.4 % (ref 36.0–46.0)
Hemoglobin: 12.6 g/dL (ref 12.0–15.0)
MCH: 34.1 pg — ABNORMAL HIGH (ref 26.0–34.0)
MCHC: 34.6 g/dL (ref 30.0–36.0)
MCV: 98.4 fL (ref 78.0–100.0)
PLATELETS: 277 10*3/uL (ref 150–400)
RBC: 3.7 MIL/uL — AB (ref 3.87–5.11)
RDW: 13.3 % (ref 11.5–15.5)
WBC: 3.2 10*3/uL — AB (ref 4.0–10.5)

## 2015-09-25 LAB — COMPREHENSIVE METABOLIC PANEL
ALK PHOS: 233 U/L — AB (ref 38–126)
ALT: 49 U/L (ref 14–54)
ANION GAP: 17 — AB (ref 5–15)
AST: 70 U/L — ABNORMAL HIGH (ref 15–41)
Albumin: 4.2 g/dL (ref 3.5–5.0)
BUN: 20 mg/dL (ref 6–20)
CALCIUM: 8.7 mg/dL — AB (ref 8.9–10.3)
CO2: 20 mmol/L — ABNORMAL LOW (ref 22–32)
CREATININE: 0.7 mg/dL (ref 0.44–1.00)
Chloride: 98 mmol/L — ABNORMAL LOW (ref 101–111)
Glucose, Bld: 82 mg/dL (ref 65–99)
Potassium: 4.2 mmol/L (ref 3.5–5.1)
Sodium: 135 mmol/L (ref 135–145)
TOTAL PROTEIN: 7.4 g/dL (ref 6.5–8.1)
Total Bilirubin: 0.8 mg/dL (ref 0.3–1.2)

## 2015-09-25 LAB — URINALYSIS, ROUTINE W REFLEX MICROSCOPIC
Glucose, UA: NEGATIVE mg/dL
Ketones, ur: >80 mg/dL — AB
LEUKOCYTES UA: NEGATIVE
NITRITE: NEGATIVE
PROTEIN: NEGATIVE mg/dL
Specific Gravity, Urine: 1.031 — ABNORMAL HIGH (ref 1.005–1.030)
pH: 6 (ref 5.0–8.0)

## 2015-09-25 LAB — URINE MICROSCOPIC-ADD ON

## 2015-09-25 LAB — LIPASE, BLOOD: Lipase: 35 U/L (ref 11–51)

## 2015-09-25 MED ORDER — HYDROMORPHONE HCL 1 MG/ML IJ SOLN
1.0000 mg | Freq: Once | INTRAMUSCULAR | Status: AC
  Administered 2015-09-25: 1 mg via INTRAVENOUS
  Filled 2015-09-25: qty 1

## 2015-09-25 MED ORDER — CYCLOBENZAPRINE HCL 10 MG PO TABS: 10.0000 mg | ORAL_TABLET | Freq: Two times a day (BID) | ORAL | Status: DC | PRN

## 2015-09-25 MED ORDER — DICYCLOMINE HCL 10 MG PO CAPS
10.0000 mg | ORAL_CAPSULE | Freq: Once | ORAL | Status: AC
  Administered 2015-09-25: 10 mg via ORAL
  Filled 2015-09-25: qty 1

## 2015-09-25 MED ORDER — PANTOPRAZOLE SODIUM 40 MG PO TBEC
40.0000 mg | DELAYED_RELEASE_TABLET | Freq: Once | ORAL | Status: AC
  Administered 2015-09-25: 40 mg via ORAL
  Filled 2015-09-25: qty 1

## 2015-09-25 MED ORDER — SODIUM CHLORIDE 0.9 % IV BOLUS (SEPSIS)
1000.0000 mL | Freq: Once | INTRAVENOUS | Status: AC
  Administered 2015-09-25: 1000 mL via INTRAVENOUS

## 2015-09-25 MED ORDER — ONDANSETRON HCL 4 MG/2ML IJ SOLN
4.0000 mg | Freq: Once | INTRAMUSCULAR | Status: AC
  Administered 2015-09-25: 4 mg via INTRAVENOUS
  Filled 2015-09-25: qty 2

## 2015-09-25 MED ORDER — GI COCKTAIL ~~LOC~~
30.0000 mL | Freq: Once | ORAL | Status: AC
  Administered 2015-09-25: 30 mL via ORAL
  Filled 2015-09-25: qty 30

## 2015-09-25 MED ORDER — DICYCLOMINE HCL 20 MG PO TABS: 20.0000 mg | ORAL_TABLET | Freq: Two times a day (BID) | ORAL | Status: DC

## 2015-09-25 NOTE — ED Provider Notes (Signed)
CSN: [id number]     Arrival date & time 09/25/15  [id number] History   First MD Initiated Contact with Patient 09/25/15 1051     Chief Complaint  Patient presents with  . Abdominal Pain     (Consider location/radiation/quality/duration/timing/severity/associated sxs/prior Treatment)   HPI   Patient reports fall 3 days ago, with right hip pain, was drinking to get pain away.  Severe pain right hip and epigastrum of abdomen.    Abd pain Started 8 or 9AM today, similar pain in past.  N, vomiting, diarrhea and epigastric pain. Hip pain over right buttock since fall 1 wk ago. XR negative. Able to walk however with increased pain.  Does not have narcotic pain medications to take at home.    Past Medical History  Diagnosis Date  . Depression   . Migraine   . Fibromyalgia   . Hearing impairment   . Arthritis   . Cardiomyopathy   . Pulmonary embolism ([place])   . Gastritis   . Osteoporosis   . Anemia   . Allergy     SEASONAL  . Anxiety   . GERD (gastroesophageal reflux disease)   . Hypertension     [name], take htn medication to regulate heart beat.  . Sleep apnea   . Vitamin D deficiency   . Deaf    Past Surgical History  Procedure Laterality Date  . Appendectomy    . Inner ear surgery      TUBES  . Tonsillectomy and adenoidectomy    . Tubal ligation    . Laparoscopic cholecystectomy  2012  . Cardiac surgery  January 2007    mitral valve repair  . Cardiac surgery  1973    atrial septal defect  . Cochlear implant     Family History  Problem Relation Age of Onset  . Breast cancer Mother     bilateral; ages 13 and 51; TAH/BSO ~50  . Depression Sister   . Heart disease Father   . Hypertension Father   . Colon cancer Neg Hx   . Esophageal cancer Neg Hx   . Pancreatic cancer Neg Hx   . Rectal cancer Neg Hx   . Stomach cancer Neg Hx    Social History  Substance Use Topics  . Smoking status: Never Smoker   . Smokeless tobacco: None  . Alcohol Use: No   OB History    Gravida Para Term Preterm AB TAB SAB Ectopic Multiple Living   2 2 2  0 0 0 0 0       Review of Systems  Constitutional: Negative for fever.  HENT: Negative for sore throat.   Eyes: Negative for visual disturbance.  Respiratory: Negative for cough and shortness of breath.   Cardiovascular: Negative for chest pain.  Gastrointestinal: Positive for nausea, vomiting and diarrhea. Negative for abdominal pain, blood in stool and anal bleeding.  Genitourinary: Negative for dysuria and difficulty urinating.  Musculoskeletal: Positive for arthralgias. Negative for back pain and neck pain.  Skin: Negative for rash.  Neurological: Negative for syncope and headaches.      Allergies  Depakote; Valium; and Diazepam  Home Medications   Prior to Admission medications   Medication Sig Start Date End Date Taking? Authorizing Provider  aspirin 81 MG chewable tablet Chew 81 mg by mouth daily.   Yes Historical Provider, MD  carvedilol (COREG) 25 MG tablet TAKE 1 TABLET BY MOUTH TWICE A DAY WITH MEALS (APPOINTMENT NEEDED FOR ADDITIONAL REFILLS) 07/11/15  Yes Thana Farr  Skains, MD  Cholecalciferol (VITAMIN D-3 PO) Take 2 capsules by mouth daily.   Yes Historical Provider, MD  Cyanocobalamin (VITAMIN B-12 PO) Take 1 tablet by mouth daily.   Yes Historical Provider, MD  Ferrous Sulfate (IRON) 142 (45 Fe) MG TBCR Take 1 tablet by mouth daily.   Yes Historical Provider, MD  fexofenadine (ALLEGRA) 180 MG tablet Take 180 mg by mouth daily.   Yes Historical Provider, MD  gabapentin (NEURONTIN) 100 MG capsule Take 100 mg by mouth daily as needed (headache).    Yes Historical Provider, MD  gabapentin (NEURONTIN) 600 MG tablet Take 600 mg by mouth at bedtime.   Yes Historical Provider, MD  hydrochlorothiazide (MICROZIDE) 12.5 MG capsule Take 1 capsule (12.5 mg total) by mouth every morning. 05/16/14  Yes Jerline Pain, MD  mirabegron ER (MYRBETRIQ) 50 MG TB24 tablet Take 1 tablet (50 mg total) by mouth daily. 07/17/14  Yes  Terrance Mass, MD  Multiple Vitamin (MULTIVITAMIN WITH MINERALS) TABS tablet Take 1 tablet by mouth daily.   Yes Historical Provider, MD  Omega-3 Fatty Acids (FISH OIL) 1000 MG CAPS Take 1 capsule by mouth 2 (two) times daily.    Yes Historical Provider, MD  ondansetron (ZOFRAN ODT) 8 MG disintegrating tablet Take 1 tablet (8 mg total) by mouth every 8 (eight) hours as needed for nausea or vomiting. 04/11/15  Yes Tatyana Kirichenko, PA-C  pantoprazole (PROTONIX) 40 MG tablet TAKE 1 TABLET (40 MG TOTAL) BY MOUTH DAILY. 03/29/15  Yes Davonna Belling, MD  VIIBRYD 40 MG TABS Take 40 mg by mouth every morning. 07/07/15  Yes Historical Provider, MD  cyclobenzaprine (FLEXERIL) 10 MG tablet Take 1 tablet (10 mg total) by mouth 2 (two) times daily as needed for muscle spasms. 09/25/15   Gareth Morgan, MD  dicyclomine (BENTYL) 20 MG tablet Take 1 tablet (20 mg total) by mouth 2 (two) times daily. 09/25/15   Gareth Morgan, MD  HYDROcodone-acetaminophen (NORCO) 5-325 MG tablet Take 1 tablet by mouth every 6 (six) hours as needed for severe pain. Patient not taking: Reported on 09/25/2015 09/16/15   Mercedes Camprubi-Soms, PA-C  HYDROcodone-acetaminophen (NORCO/VICODIN) 5-325 MG tablet Take 1 tablet by mouth every 4 (four) hours as needed for moderate pain or severe pain. Patient not taking: Reported on 09/16/2015 07/17/15   Clayton Bibles, PA-C  ondansetron (ZOFRAN ODT) 8 MG disintegrating tablet Take 1 tablet (8 mg total) by mouth every 8 (eight) hours as needed for nausea or vomiting. 09/16/15   Mercedes Camprubi-Soms, PA-C  oxyCODONE-acetaminophen (PERCOCET) 5-325 MG tablet Take 1 tablet by mouth every 6 (six) hours as needed. Patient not taking: Reported on 04/11/2015 03/29/15   Davonna Belling, MD  penicillin v potassium (VEETID) 500 MG tablet Take 1 tablet (500 mg total) by mouth 4 (four) times daily. Patient not taking: Reported on 09/16/2015 07/17/15   Clayton Bibles, PA-C  ranitidine (ZANTAC) 150 MG tablet Take 1  tablet (150 mg total) by mouth 2 (two) times daily. Patient not taking: Reported on 09/25/2015 09/16/15   Mercedes Camprubi-Soms, PA-C   BP 130/93 mmHg  Pulse 77  Temp(Src) 98 F (36.7 C) (Oral)  Resp 16  SpO2 98% Physical Exam  Constitutional: She is oriented to person, place, and time. She appears well-developed and well-nourished. No distress.  HENT:  Head: Normocephalic and atraumatic.  Eyes: Conjunctivae and EOM are normal.  Neck: Normal range of motion.  Cardiovascular: Normal rate, regular rhythm, normal heart sounds and intact distal pulses.  Exam reveals  no gallop and no friction rub.   No murmur heard. Pulmonary/Chest: Effort normal and breath sounds normal. No respiratory distress. She has no wheezes. She has no rales.  Abdominal: Soft. She exhibits no distension. There is no tenderness. There is no guarding.  Musculoskeletal: She exhibits no edema.       Right hip: She exhibits tenderness (right buttock tenderness,).  Neurological: She is alert and oriented to person, place, and time.  Skin: Skin is warm and dry. No rash noted. She is not diaphoretic. No erythema.  Nursing note and vitals reviewed.   ED Course  Procedures (including critical care time) Labs Review Labs Reviewed  COMPREHENSIVE METABOLIC PANEL - Abnormal; Notable for the following:    Chloride 98 (*)    CO2 20 (*)    Calcium 8.7 (*)    AST 70 (*)    Alkaline Phosphatase 233 (*)    Anion gap 17 (*)    All other components within normal limits  CBC - Abnormal; Notable for the following:    WBC 3.2 (*)    RBC 3.70 (*)    MCH 34.1 (*)    All other components within normal limits  URINALYSIS, ROUTINE W REFLEX MICROSCOPIC (NOT AT [facility]) - Abnormal; Notable for the following:    Color, Urine AMBER (*)    APPearance TURBID (*)    Specific Gravity, Urine 1.031 (*)    Hgb urine dipstick SMALL (*)    Bilirubin Urine SMALL (*)    Ketones, ur >80 (*)    All other components within normal limits  URINE  MICROSCOPIC-ADD ON - Abnormal; Notable for the following:    Squamous Epithelial / LPF 0-5 (*)    Bacteria, UA MANY (*)    All other components within normal limits  LIPASE, BLOOD    Imaging Review Dg Hip Unilat With Pelvis 2-3 Views Right  09/25/2015  CLINICAL DATA:  54 year old female status post fall last night with continued right hip pain. Initial encounter. EXAM: DG HIP (WITH OR WITHOUT PELVIS) 2-3V RIGHT COMPARISON:  CT Abdomen and Pelvis 03/29/2015. FINDINGS: Femoral heads are normally located. Hip joint spaces appear stable. Pelvis intact. Proximal right femur intact. Sacral ala and SI joints appear within normal limits. IMPRESSION: No acute fracture or dislocation identified about the right hip or pelvis. Electronically Signed   By: Genevie Ann M.D.   On: 09/25/2015 13:14   I have personally reviewed and evaluated these images and lab results as part of my medical decision-making.   EKG Interpretation None      MDM   Final diagnoses:  Gastritis  Right hip pain   55yo female with history of depression, migraines, fibromyalgia, PE, htn, hearing impairment, etoh abuse who presents with concern for right hip pain and abdominal pain.  Abdominal exam benign, labs WNL, doubt pancreatitis, cholecystitis, appendicitis, obstruction or other.  Pt likely with gastritis from etoh abuse.  Given GI cocktail, and recommend protonix continued as outpt. Also give rx for bentyl.  Pt also reports right hip pain, ,has tenderness over right buttock. XR one week ago and today both negative, is able to ambulate, and given pain improving with time have decreased suspicion for occult fx.  Likely contusion.  Recommend continued close outpt follow up. Pt tolerating gingerale. Patient discharged in stable condition with understanding of reasons to return.    Gareth Morgan, MD 09/25/15 2320

## 2015-09-25 NOTE — ED Notes (Signed)
Pt's mother reports drank last night d/t severe buttock pain from a fall a week ago and any pain meds rx to her has not been giving her any relief.  Pt also reports abd pain and n/v.  Mother states "I know when someone has been drinking, the alcohol gets in your blood and that's not good, and it needs to be flushed.  So I was wondering if you all can give her fluids to flush it."

## 2015-09-30 NOTE — Telephone Encounter (Signed)
Patient had recent ED visit with labs done on 09/25/15.. BUN  20 Creatinine 0.70  Calcium 8.7  . RECLAST due after 10/04/15,orders in [facility] for labs to be done on 10/03/15 for complete exam with Dr Toney Rakes,. I will make sure that Dr Toney Rakes wants to cancel the labs we have ordered on 10/03/15.

## 2015-09-30 NOTE — Telephone Encounter (Signed)
Does not need repeat labs.

## 2015-10-03 ENCOUNTER — Encounter: Payer: Medicare Other | Admitting: Gynecology

## 2015-10-06 ENCOUNTER — Encounter (HOSPITAL_COMMUNITY): Payer: Self-pay | Admitting: Emergency Medicine

## 2015-10-06 ENCOUNTER — Emergency Department (HOSPITAL_COMMUNITY): Payer: Medicare Other

## 2015-10-06 ENCOUNTER — Emergency Department (HOSPITAL_COMMUNITY)
Admission: EM | Admit: 2015-10-06 | Discharge: 2015-10-06 | Disposition: A | Discharge status: Home / Self Care | Payer: Medicare Other | Attending: Emergency Medicine | Admitting: Emergency Medicine

## 2015-10-06 DIAGNOSIS — Z79891 Long term (current) use of opiate analgesic: Secondary | ICD-10-CM | POA: Diagnosis not present

## 2015-10-06 DIAGNOSIS — R1011 Right upper quadrant pain: Secondary | ICD-10-CM | POA: Diagnosis not present

## 2015-10-06 DIAGNOSIS — Z7982 Long term (current) use of aspirin: Secondary | ICD-10-CM | POA: Insufficient documentation

## 2015-10-06 DIAGNOSIS — Z79899 Other long term (current) drug therapy: Secondary | ICD-10-CM | POA: Diagnosis not present

## 2015-10-06 DIAGNOSIS — R112 Nausea with vomiting, unspecified: Secondary | ICD-10-CM | POA: Diagnosis not present

## 2015-10-06 DIAGNOSIS — R197 Diarrhea, unspecified: Secondary | ICD-10-CM | POA: Diagnosis not present

## 2015-10-06 DIAGNOSIS — I10 Essential (primary) hypertension: Secondary | ICD-10-CM | POA: Diagnosis not present

## 2015-10-06 DIAGNOSIS — K2970 Gastritis, unspecified, without bleeding: Secondary | ICD-10-CM | POA: Diagnosis not present

## 2015-10-06 DIAGNOSIS — K760 Fatty (change of) liver, not elsewhere classified: Secondary | ICD-10-CM | POA: Diagnosis not present

## 2015-10-06 LAB — CBC WITH DIFFERENTIAL/PLATELET
Basophils Absolute: 0 10*3/uL (ref 0.0–0.1)
Basophils Relative: 0 %
EOS ABS: 0 10*3/uL (ref 0.0–0.7)
EOS PCT: 1 %
HCT: 34.8 % — ABNORMAL LOW (ref 36.0–46.0)
Hemoglobin: 12.2 g/dL (ref 12.0–15.0)
LYMPHS ABS: 0.4 10*3/uL — AB (ref 0.7–4.0)
Lymphocytes Relative: 11 %
MCH: 33.7 pg (ref 26.0–34.0)
MCHC: 35.1 g/dL (ref 30.0–36.0)
MCV: 96.1 fL (ref 78.0–100.0)
MONOS PCT: 20 %
Monocytes Absolute: 0.7 10*3/uL (ref 0.1–1.0)
Neutro Abs: 2.2 10*3/uL (ref 1.7–7.7)
Neutrophils Relative %: 68 %
PLATELETS: 147 10*3/uL — AB (ref 150–400)
RBC: 3.62 MIL/uL — ABNORMAL LOW (ref 3.87–5.11)
RDW: 13.7 % (ref 11.5–15.5)
WBC: 3.3 10*3/uL — AB (ref 4.0–10.5)

## 2015-10-06 LAB — COMPREHENSIVE METABOLIC PANEL
ALT: 53 U/L (ref 14–54)
ANION GAP: 17 — AB (ref 5–15)
AST: 84 U/L — ABNORMAL HIGH (ref 15–41)
Albumin: 3.8 g/dL (ref 3.5–5.0)
Alkaline Phosphatase: 183 U/L — ABNORMAL HIGH (ref 38–126)
BUN: 9 mg/dL (ref 6–20)
CHLORIDE: 97 mmol/L — AB (ref 101–111)
CO2: 22 mmol/L (ref 22–32)
Calcium: 9.3 mg/dL (ref 8.9–10.3)
Creatinine, Ser: 0.62 mg/dL (ref 0.44–1.00)
GFR calc non Af Amer: >60 mL/min (ref >60)
Glucose, Bld: 100 mg/dL — ABNORMAL HIGH (ref 65–99)
Potassium: 3.8 mmol/L (ref 3.5–5.1)
SODIUM: 136 mmol/L (ref 135–145)
Total Bilirubin: 0.8 mg/dL (ref 0.3–1.2)
Total Protein: 6.5 g/dL (ref 6.5–8.1)

## 2015-10-06 LAB — LIPASE, BLOOD: Lipase: 46 U/L (ref 11–51)

## 2015-10-06 MED ORDER — ONDANSETRON HCL 4 MG/2ML IJ SOLN
4.0000 mg | Freq: Once | INTRAMUSCULAR | Status: AC
  Administered 2015-10-06: 4 mg via INTRAVENOUS
  Filled 2015-10-06: qty 2

## 2015-10-06 MED ORDER — SODIUM CHLORIDE 0.9 % IV BOLUS (SEPSIS)
1000.0000 mL | Freq: Once | INTRAVENOUS | Status: AC
  Administered 2015-10-06: 1000 mL via INTRAVENOUS

## 2015-10-06 MED ORDER — METOCLOPRAMIDE HCL 5 MG/ML IJ SOLN
10.0000 mg | Freq: Once | INTRAMUSCULAR | Status: AC
  Administered 2015-10-06: 10 mg via INTRAVENOUS
  Filled 2015-10-06: qty 2

## 2015-10-06 MED ORDER — IOPAMIDOL (ISOVUE-300) INJECTION 61%
100.0000 mL | Freq: Once | INTRAVENOUS | Status: AC | PRN
  Administered 2015-10-06: 100 mL via INTRAVENOUS

## 2015-10-06 MED ORDER — OXYCODONE HCL 5 MG PO TABS: 5.0000 mg | ORAL_TABLET | ORAL | Status: DC | PRN

## 2015-10-06 MED ORDER — MORPHINE SULFATE (PF) 4 MG/ML IV SOLN
4.0000 mg | Freq: Once | INTRAVENOUS | Status: AC
  Administered 2015-10-06: 4 mg via INTRAVENOUS
  Filled 2015-10-06: qty 1

## 2015-10-06 MED ORDER — DIPHENHYDRAMINE HCL 50 MG/ML IJ SOLN
25.0000 mg | Freq: Once | INTRAMUSCULAR | Status: AC
  Administered 2015-10-06: 25 mg via INTRAVENOUS
  Filled 2015-10-06: qty 1

## 2015-10-06 MED ORDER — GI COCKTAIL ~~LOC~~
30.0000 mL | Freq: Once | ORAL | Status: AC
  Administered 2015-10-06: 30 mL via ORAL
  Filled 2015-10-06: qty 30

## 2015-10-06 MED ORDER — ONDANSETRON 4 MG PO TBDP: ORAL_TABLET | ORAL | Status: DC

## 2015-10-06 NOTE — ED Notes (Signed)
Per [name] pt from home reports emesis and diarrhea x 3 days, reports been very stressed lately and believes that her symptoms are due to stress. Alert and oriented x 4.

## 2015-10-06 NOTE — ED Notes (Signed)
Bed: WA04 Expected date:  Expected time:  Means of arrival:  Comments: ems 

## 2015-10-06 NOTE — Discharge Instructions (Signed)

## 2015-10-06 NOTE — ED Provider Notes (Signed)
CSN: [id number]     Arrival date & time 10/06/15  0919 History   First MD Initiated Contact with Patient 10/06/15 [phone]     Chief Complaint  Patient presents with  . Emesis  . Diarrhea     (Consider location/radiation/quality/duration/timing/severity/associated sxs/prior Treatment) Patient is a 55 y.o. female presenting with vomiting, diarrhea, and abdominal pain. The history is provided by the patient.  Emesis Associated symptoms: abdominal pain and diarrhea   Associated symptoms: no arthralgias, no chills, no headaches and no myalgias   Diarrhea Associated symptoms: abdominal pain and vomiting   Associated symptoms: no arthralgias, no chills, no fever, no headaches and no myalgias   Abdominal Pain Pain location:  RUQ Pain quality: sharp and shooting   Pain radiates to:  Does not radiate Pain severity:  Severe Onset quality:  Sudden Duration:  2 days Timing:  Constant Progression:  Worsening Chronicity:  Recurrent Relieved by:  Nothing Worsened by:  Nothing tried Ineffective treatments:  None tried Associated symptoms: diarrhea, nausea and vomiting   Associated symptoms: no chest pain, no chills, no dysuria, no fever and no shortness of breath     55 yo F With a chief complaint of nausea vomiting diarrhea. Going on for the past couple days. Also having some epigastric abdominal pain with this. Sharp and shooting denies radiation. Has a history of gastritis of the past. Has had 2 open-heart surgeries denies prior abdominal surgery.  Past Medical History  Diagnosis Date  . Depression   . Migraine   . Fibromyalgia   . Hearing impairment   . Arthritis   . Cardiomyopathy   . Pulmonary embolism ([place])   . Gastritis   . Osteoporosis   . Anemia   . Allergy     SEASONAL  . Anxiety   . GERD (gastroesophageal reflux disease)   . Hypertension     [name], take htn medication to regulate heart beat.  . Sleep apnea   . Vitamin D deficiency   . Deaf    Past Surgical History   Procedure Laterality Date  . Appendectomy    . Inner ear surgery      TUBES  . Tonsillectomy and adenoidectomy    . Tubal ligation    . Laparoscopic cholecystectomy  2012  . Cardiac surgery  January 2007    mitral valve repair  . Cardiac surgery  1973    atrial septal defect  . Cochlear implant     Family History  Problem Relation Age of Onset  . Breast cancer Mother     bilateral; ages 13 and 34; TAH/BSO ~50  . Depression Sister   . Heart disease Father   . Hypertension Father   . Colon cancer Neg Hx   . Esophageal cancer Neg Hx   . Pancreatic cancer Neg Hx   . Rectal cancer Neg Hx   . Stomach cancer Neg Hx    Social History  Substance Use Topics  . Smoking status: Never Smoker   . Smokeless tobacco: None  . Alcohol Use: No   OB History    Gravida Para Term Preterm AB TAB SAB Ectopic Multiple Living   2 2 2  0 0 0 0 0       Review of Systems  Constitutional: Negative for fever and chills.  HENT: Negative for congestion and rhinorrhea.   Eyes: Negative for redness and visual disturbance.  Respiratory: Negative for shortness of breath and wheezing.   Cardiovascular: Negative for chest pain and palpitations.  Gastrointestinal: Positive for nausea, vomiting, abdominal pain and diarrhea.  Genitourinary: Negative for dysuria and urgency.  Musculoskeletal: Negative for myalgias and arthralgias.  Skin: Negative for pallor and wound.  Neurological: Negative for dizziness and headaches.      Allergies  Depakote; Valium; and Diazepam  Home Medications   Prior to Admission medications   Medication Sig Start Date End Date Taking? Authorizing Provider  aspirin 81 MG chewable tablet Chew 81 mg by mouth daily.   Yes Historical Provider, MD  carvedilol (COREG) 25 MG tablet TAKE 1 TABLET BY MOUTH TWICE A DAY WITH MEALS (APPOINTMENT NEEDED FOR ADDITIONAL REFILLS) 07/11/15  Yes Jerline Pain, MD  Cholecalciferol (VITAMIN D-3 PO) Take 2 capsules by mouth daily.   Yes  Historical Provider, MD  Cyanocobalamin (VITAMIN B-12 PO) Take 1 tablet by mouth daily.   Yes Historical Provider, MD  cyclobenzaprine (FLEXERIL) 10 MG tablet Take 1 tablet (10 mg total) by mouth 2 (two) times daily as needed for muscle spasms. 09/25/15  Yes Gareth Morgan, MD  dicyclomine (BENTYL) 20 MG tablet Take 1 tablet (20 mg total) by mouth 2 (two) times daily. 09/25/15  Yes Gareth Morgan, MD  Ferrous Sulfate (IRON) 142 (45 Fe) MG TBCR Take 1 tablet by mouth daily.   Yes Historical Provider, MD  fexofenadine (ALLEGRA) 180 MG tablet Take 180 mg by mouth daily.   Yes Historical Provider, MD  gabapentin (NEURONTIN) 100 MG capsule Take 100 mg by mouth daily as needed (headache).    Yes Historical Provider, MD  gabapentin (NEURONTIN) 600 MG tablet Take 600 mg by mouth at bedtime.   Yes Historical Provider, MD  hydrochlorothiazide (MICROZIDE) 12.5 MG capsule Take 1 capsule (12.5 mg total) by mouth every morning. 05/16/14  Yes Jerline Pain, MD  mirabegron ER (MYRBETRIQ) 50 MG TB24 tablet Take 1 tablet (50 mg total) by mouth daily. 07/17/14  Yes Terrance Mass, MD  Multiple Vitamin (MULTIVITAMIN WITH MINERALS) TABS tablet Take 1 tablet by mouth daily.   Yes Historical Provider, MD  Omega-3 Fatty Acids (FISH OIL) 1000 MG CAPS Take 1 capsule by mouth 2 (two) times daily.    Yes Historical Provider, MD  pantoprazole (PROTONIX) 40 MG tablet TAKE 1 TABLET (40 MG TOTAL) BY MOUTH DAILY. 03/29/15  Yes Davonna Belling, MD  VIIBRYD 40 MG TABS Take 40 mg by mouth every morning. 07/07/15  Yes Historical Provider, MD  HYDROcodone-acetaminophen (NORCO) 5-325 MG tablet Take 1 tablet by mouth every 6 (six) hours as needed for severe pain. Patient not taking: Reported on 09/25/2015 09/16/15   Mercedes Camprubi-Soms, PA-C  HYDROcodone-acetaminophen (NORCO/VICODIN) 5-325 MG tablet Take 1 tablet by mouth every 4 (four) hours as needed for moderate pain or severe pain. Patient not taking: Reported on 09/16/2015 07/17/15   Clayton Bibles, PA-C  ondansetron (ZOFRAN ODT) 4 MG disintegrating tablet 4mg  ODT q4 hours prn nausea/vomit 10/06/15   Deno Etienne, DO  oxyCODONE (ROXICODONE) 5 MG immediate release tablet Take 1 tablet (5 mg total) by mouth every 4 (four) hours as needed for severe pain. 10/06/15   Deno Etienne, DO  oxyCODONE-acetaminophen (PERCOCET) 5-325 MG tablet Take 1 tablet by mouth every 6 (six) hours as needed. Patient not taking: Reported on 04/11/2015 03/29/15   Davonna Belling, MD  penicillin v potassium (VEETID) 500 MG tablet Take 1 tablet (500 mg total) by mouth 4 (four) times daily. Patient not taking: Reported on 09/16/2015 07/17/15   Clayton Bibles, PA-C  ranitidine (ZANTAC) 150 MG tablet Take  1 tablet (150 mg total) by mouth 2 (two) times daily. Patient not taking: Reported on 09/25/2015 09/16/15   Mercedes Camprubi-Soms, PA-C   BP 92/65 mmHg  Pulse 89  Temp(Src) 98.4 F (36.9 C) (Oral)  Resp 14  SpO2 100% Physical Exam  Constitutional: She is oriented to person, place, and time. She appears well-developed and well-nourished. No distress.  HENT:  Head: Normocephalic and atraumatic.  Eyes: EOM are normal. Pupils are equal, round, and reactive to light.  Neck: Normal range of motion. Neck supple.  Cardiovascular: Normal rate and regular rhythm.  Exam reveals no gallop and no friction rub.   No murmur heard. Pulmonary/Chest: Effort normal. She has no wheezes. She has no rales.  Abdominal: Soft. She exhibits no distension. There is tenderness (worst in the epigastrium and RUQ). There is guarding. There is no rebound.  Musculoskeletal: She exhibits no edema or tenderness.  Neurological: She is alert and oriented to person, place, and time.  Skin: Skin is warm and dry. She is not diaphoretic.  Psychiatric: She has a normal mood and affect. Her behavior is normal.  Nursing note and vitals reviewed.   ED Course  Procedures (including critical care time) Labs Review Labs Reviewed  CBC WITH DIFFERENTIAL/PLATELET -  Abnormal; Notable for the following:    WBC 3.3 (*)    RBC 3.62 (*)    HCT 34.8 (*)    Platelets 147 (*)    Lymphs Abs 0.4 (*)    All other components within normal limits  COMPREHENSIVE METABOLIC PANEL - Abnormal; Notable for the following:    Chloride 97 (*)    Glucose, Bld 100 (*)    AST 84 (*)    Alkaline Phosphatase 183 (*)    Anion gap 17 (*)    All other components within normal limits  LIPASE, BLOOD    Imaging Review Ct Abdomen Pelvis W Contrast  10/06/2015  CLINICAL DATA:  Abdominal pain. Alert and oriented. Diarrhea and emesis. EXAM: CT ABDOMEN AND PELVIS WITH CONTRAST TECHNIQUE: Multidetector CT imaging of the abdomen and pelvis was performed using the standard protocol following bolus administration of intravenous contrast. CONTRAST:  164mL ISOVUE-300 IOPAMIDOL (ISOVUE-300) INJECTION 61% COMPARISON:  03/29/2015 FINDINGS: Lower chest:  Lung bases are clear.  Left Bochdalek's hernia. Hepatobiliary: Low attenuation of the liver as can be seen with hepatic steatosis. Area of more low attenuation in the left hepatic lobe likely reflecting an area of focal fatty deposition. No intrahepatic or extrahepatic biliary ductal dilatation. Prior cholecystectomy. Pancreas: Normal. Spleen: Normal. Adrenals/Urinary Tract: Normal adrenal glands. Normal kidneys. Normal bladder. No urolithiasis. No obstructive uropathy. Stomach/Bowel: No bowel dilatation or bowel wall thickening. No pneumatosis, pneumoperitoneum or portal venous gas. No abdominal or pelvic free fluid. Vascular/Lymphatic: Normal caliber abdominal aorta. No lymphadenopathy. Reproductive: Normal uterus.  No adnexal mass. Other: No fluid collection or hematoma. Musculoskeletal: No aggressive lytic or sclerotic osseous lesion. Chronic L1 vertebral body compression fracture with 5 mm of retropulsion of the superior posterior margin of the L1 vertebral body impressing on the thecal sac. Degenerative disc disease at L2-3, L4-5 and L5-S1. Right  sacral insufficiency fracture. IMPRESSION: 1. No acute abdominal or pelvic pathology. 2. Right sacral insufficiency fracture. 3. Hepatic steatosis. Electronically Signed   By: Kathreen Devoid   On: 10/06/2015 12:54   I have personally reviewed and evaluated these images and lab results as part of my medical decision-making.   EKG Interpretation None      MDM   Final diagnoses:  RUQ abdominal pain    55 yo F with a chief complaint of abdominal pain. Symptoms were improved with medications. Able to tolerate by mouth. CT scan negative for acute pathology.  3:00 PM:  I have discussed the diagnosis/risks/treatment options with the patient and family and believe the pt to be eligible for discharge home to follow-up with PCP. We also discussed returning to the ED immediately if new or worsening sx occur. We discussed the sx which are most concerning (e.g., sudden worsening pain, fever, inability to tolerate by mouth) that necessitate immediate return. Medications administered to the patient during their visit and any new prescriptions provided to the patient are listed below.  Medications given during this visit Medications  sodium chloride 0.9 % bolus 1,000 mL (0 mLs Intravenous Stopped 10/06/15 1201)  ondansetron (ZOFRAN) injection 4 mg (4 mg Intravenous Given 10/06/15 1006)  morphine 4 MG/ML injection 4 mg (4 mg Intravenous Given 10/06/15 1028)  ondansetron (ZOFRAN) injection 4 mg (4 mg Intravenous Given 10/06/15 1028)  gi cocktail (Maalox,Lidocaine,Donnatal) (30 mLs Oral Given 10/06/15 1028)  metoCLOPramide (REGLAN) injection 10 mg (10 mg Intravenous Given 10/06/15 1158)  diphenhydrAMINE (BENADRYL) injection 25 mg (25 mg Intravenous Given 10/06/15 1158)  iopamidol (ISOVUE-300) 61 % injection 100 mL (100 mLs Intravenous Contrast Given 10/06/15 1225)    Discharge Medication List as of 10/06/2015  1:46 PM    START taking these medications   Details  oxyCODONE (ROXICODONE) 5 MG immediate release  tablet Take 1 tablet (5 mg total) by mouth every 4 (four) hours as needed for severe pain., Starting 10/06/2015, Until Discontinued, Print        The patient appears reasonably screen and/or stabilized for discharge and I doubt any other medical condition or other [facility] requiring further screening, evaluation, or treatment in the ED at this time prior to discharge.      Deno Etienne, DO 10/06/15 1500

## 2015-10-07 NOTE — Progress Notes (Signed)
erro  neous encounter

## 2015-10-09 DIAGNOSIS — M545 Low back pain: Secondary | ICD-10-CM | POA: Diagnosis not present

## 2015-10-09 DIAGNOSIS — Z79899 Other long term (current) drug therapy: Secondary | ICD-10-CM | POA: Diagnosis not present

## 2015-10-09 DIAGNOSIS — G894 Chronic pain syndrome: Secondary | ICD-10-CM | POA: Diagnosis not present

## 2015-10-09 DIAGNOSIS — M1288 Other specific arthropathies, not elsewhere classified, other specified site: Secondary | ICD-10-CM | POA: Diagnosis not present

## 2015-10-09 DIAGNOSIS — Z79891 Long term (current) use of opiate analgesic: Secondary | ICD-10-CM | POA: Diagnosis not present

## 2015-10-09 DIAGNOSIS — M79606 Pain in leg, unspecified: Secondary | ICD-10-CM | POA: Diagnosis not present

## 2015-10-09 NOTE — Telephone Encounter (Signed)
Her last calcium level was normal on May 29 she can schedule her Reclast for this month if it is time for it is once a year

## 2015-10-09 NOTE — Telephone Encounter (Signed)
Patient cancelled complete exam with Dr Toney Rakes. Will ask Dr Moshe Salisbury regarding Reclast instructions . Time frame of 30 days for Recast labs done 09/25/15

## 2015-10-13 NOTE — Telephone Encounter (Signed)
PC to pt. To confirm Reclast to be scheduled. All labs are current, no change with insurance Medicare primary and Medicaid secondary so Reclast is covered  M81.0  Scheduled for 10/21/15  At 10 am  at [facility] outpt infusion center.

## 2015-10-17 ENCOUNTER — Other Ambulatory Visit: Payer: Self-pay | Admitting: *Deleted

## 2015-10-17 MED ORDER — CARVEDILOL 25 MG PO TABS: ORAL_TABLET | ORAL | Status: DC

## 2015-10-19 ENCOUNTER — Encounter (HOSPITAL_COMMUNITY): Payer: Self-pay

## 2015-10-19 ENCOUNTER — Inpatient Hospital Stay (HOSPITAL_COMMUNITY)
Admission: EM | Admit: 2015-10-19 | Discharge: 2015-10-27 | DRG: 917 | Disposition: A | Discharge status: Skilled Nursing Facility | Payer: Medicare Other | Attending: Internal Medicine | Admitting: Internal Medicine

## 2015-10-19 ENCOUNTER — Emergency Department (HOSPITAL_COMMUNITY): Payer: Medicare Other

## 2015-10-19 DIAGNOSIS — N19 Unspecified kidney failure: Secondary | ICD-10-CM | POA: Diagnosis not present

## 2015-10-19 DIAGNOSIS — D649 Anemia, unspecified: Secondary | ICD-10-CM | POA: Diagnosis not present

## 2015-10-19 DIAGNOSIS — I959 Hypotension, unspecified: Secondary | ICD-10-CM | POA: Diagnosis not present

## 2015-10-19 DIAGNOSIS — Z7982 Long term (current) use of aspirin: Secondary | ICD-10-CM | POA: Diagnosis not present

## 2015-10-19 DIAGNOSIS — E872 Acidosis, unspecified: Secondary | ICD-10-CM

## 2015-10-19 DIAGNOSIS — R7989 Other specified abnormal findings of blood chemistry: Secondary | ICD-10-CM

## 2015-10-19 DIAGNOSIS — R2689 Other abnormalities of gait and mobility: Secondary | ICD-10-CM | POA: Diagnosis not present

## 2015-10-19 DIAGNOSIS — N179 Acute kidney failure, unspecified: Secondary | ICD-10-CM

## 2015-10-19 DIAGNOSIS — R402431 Glasgow coma scale score 3-8, in the field [EMT or ambulance]: Secondary | ICD-10-CM | POA: Diagnosis not present

## 2015-10-19 DIAGNOSIS — Z952 Presence of prosthetic heart valve: Secondary | ICD-10-CM

## 2015-10-19 DIAGNOSIS — R748 Abnormal levels of other serum enzymes: Secondary | ICD-10-CM | POA: Diagnosis present

## 2015-10-19 DIAGNOSIS — F1020 Alcohol dependence, uncomplicated: Secondary | ICD-10-CM | POA: Diagnosis present

## 2015-10-19 DIAGNOSIS — R778 Other specified abnormalities of plasma proteins: Secondary | ICD-10-CM

## 2015-10-19 DIAGNOSIS — R740 Nonspecific elevation of levels of transaminase and lactic acid dehydrogenase [LDH]: Secondary | ICD-10-CM | POA: Diagnosis not present

## 2015-10-19 DIAGNOSIS — J9601 Acute respiratory failure with hypoxia: Secondary | ICD-10-CM | POA: Diagnosis not present

## 2015-10-19 DIAGNOSIS — N170 Acute kidney failure with tubular necrosis: Secondary | ICD-10-CM | POA: Diagnosis present

## 2015-10-19 DIAGNOSIS — B179 Acute viral hepatitis, unspecified: Secondary | ICD-10-CM | POA: Diagnosis present

## 2015-10-19 DIAGNOSIS — G9341 Metabolic encephalopathy: Secondary | ICD-10-CM | POA: Diagnosis present

## 2015-10-19 DIAGNOSIS — R34 Anuria and oliguria: Secondary | ICD-10-CM | POA: Diagnosis present

## 2015-10-19 DIAGNOSIS — R7401 Elevation of levels of liver transaminase levels: Secondary | ICD-10-CM

## 2015-10-19 DIAGNOSIS — Z452 Encounter for adjustment and management of vascular access device: Secondary | ICD-10-CM | POA: Diagnosis not present

## 2015-10-19 DIAGNOSIS — J690 Pneumonitis due to inhalation of food and vomit: Secondary | ICD-10-CM

## 2015-10-19 DIAGNOSIS — M797 Fibromyalgia: Secondary | ICD-10-CM | POA: Diagnosis present

## 2015-10-19 DIAGNOSIS — M6282 Rhabdomyolysis: Secondary | ICD-10-CM | POA: Diagnosis not present

## 2015-10-19 DIAGNOSIS — E876 Hypokalemia: Secondary | ICD-10-CM | POA: Diagnosis not present

## 2015-10-19 DIAGNOSIS — T48201A Poisoning by unspecified drugs acting on muscles, accidental (unintentional), initial encounter: Secondary | ICD-10-CM | POA: Diagnosis not present

## 2015-10-19 DIAGNOSIS — D6959 Other secondary thrombocytopenia: Secondary | ICD-10-CM | POA: Diagnosis present

## 2015-10-19 DIAGNOSIS — R4182 Altered mental status, unspecified: Secondary | ICD-10-CM | POA: Diagnosis not present

## 2015-10-19 DIAGNOSIS — R918 Other nonspecific abnormal finding of lung field: Secondary | ICD-10-CM | POA: Diagnosis not present

## 2015-10-19 DIAGNOSIS — T50901A Poisoning by unspecified drugs, medicaments and biological substances, accidental (unintentional), initial encounter: Secondary | ICD-10-CM

## 2015-10-19 DIAGNOSIS — K7200 Acute and subacute hepatic failure without coma: Secondary | ICD-10-CM | POA: Diagnosis present

## 2015-10-19 DIAGNOSIS — J9690 Respiratory failure, unspecified, unspecified whether with hypoxia or hypercapnia: Secondary | ICD-10-CM | POA: Diagnosis not present

## 2015-10-19 DIAGNOSIS — R578 Other shock: Secondary | ICD-10-CM | POA: Diagnosis not present

## 2015-10-19 DIAGNOSIS — J189 Pneumonia, unspecified organism: Secondary | ICD-10-CM | POA: Diagnosis not present

## 2015-10-19 DIAGNOSIS — H9190 Unspecified hearing loss, unspecified ear: Secondary | ICD-10-CM | POA: Diagnosis present

## 2015-10-19 DIAGNOSIS — E539 Vitamin B deficiency, unspecified: Secondary | ICD-10-CM | POA: Diagnosis not present

## 2015-10-19 DIAGNOSIS — D696 Thrombocytopenia, unspecified: Secondary | ICD-10-CM | POA: Diagnosis not present

## 2015-10-19 DIAGNOSIS — E559 Vitamin D deficiency, unspecified: Secondary | ICD-10-CM | POA: Diagnosis not present

## 2015-10-19 DIAGNOSIS — R278 Other lack of coordination: Secondary | ICD-10-CM | POA: Diagnosis not present

## 2015-10-19 DIAGNOSIS — R1314 Dysphagia, pharyngoesophageal phase: Secondary | ICD-10-CM | POA: Diagnosis not present

## 2015-10-19 DIAGNOSIS — K219 Gastro-esophageal reflux disease without esophagitis: Secondary | ICD-10-CM | POA: Diagnosis not present

## 2015-10-19 DIAGNOSIS — D6489 Other specified anemias: Secondary | ICD-10-CM | POA: Diagnosis present

## 2015-10-19 DIAGNOSIS — R579 Shock, unspecified: Secondary | ICD-10-CM | POA: Diagnosis not present

## 2015-10-19 DIAGNOSIS — G9340 Encephalopathy, unspecified: Secondary | ICD-10-CM | POA: Diagnosis not present

## 2015-10-19 DIAGNOSIS — R7881 Bacteremia: Secondary | ICD-10-CM

## 2015-10-19 DIAGNOSIS — I319 Disease of pericardium, unspecified: Secondary | ICD-10-CM | POA: Diagnosis not present

## 2015-10-19 DIAGNOSIS — E874 Mixed disorder of acid-base balance: Secondary | ICD-10-CM | POA: Diagnosis present

## 2015-10-19 DIAGNOSIS — R0602 Shortness of breath: Secondary | ICD-10-CM | POA: Diagnosis not present

## 2015-10-19 DIAGNOSIS — H04129 Dry eye syndrome of unspecified lacrimal gland: Secondary | ICD-10-CM | POA: Diagnosis not present

## 2015-10-19 HISTORY — DX: Shock, unspecified: R57.9

## 2015-10-19 LAB — URINALYSIS, ROUTINE W REFLEX MICROSCOPIC
Glucose, UA: NEGATIVE mg/dL
Ketones, ur: NEGATIVE mg/dL
Leukocytes, UA: NEGATIVE
Nitrite: NEGATIVE
Protein, ur: 30 mg/dL — AB
Specific Gravity, Urine: 1.019 (ref 1.005–1.030)
pH: 5.5 (ref 5.0–8.0)

## 2015-10-19 LAB — COMPREHENSIVE METABOLIC PANEL WITH GFR
ALT: 2285 U/L — ABNORMAL HIGH (ref 14–54)
AST: 4887 U/L — ABNORMAL HIGH (ref 15–41)
Albumin: 2.9 g/dL — ABNORMAL LOW (ref 3.5–5.0)
Alkaline Phosphatase: 227 U/L — ABNORMAL HIGH (ref 38–126)
Anion gap: 19 — ABNORMAL HIGH (ref 5–15)
BUN: 41 mg/dL — ABNORMAL HIGH (ref 6–20)
CO2: 14 mmol/L — ABNORMAL LOW (ref 22–32)
Calcium: 7.2 mg/dL — ABNORMAL LOW (ref 8.9–10.3)
Chloride: 103 mmol/L (ref 101–111)
Creatinine, Ser: 3.59 mg/dL — ABNORMAL HIGH (ref 0.44–1.00)
GFR calc Af Amer: 15 mL/min — ABNORMAL LOW
GFR calc non Af Amer: 13 mL/min — ABNORMAL LOW
Glucose, Bld: 92 mg/dL (ref 65–99)
Potassium: 4 mmol/L (ref 3.5–5.1)
Sodium: 136 mmol/L (ref 135–145)
Total Bilirubin: 1.1 mg/dL (ref 0.3–1.2)
Total Protein: 5.4 g/dL — ABNORMAL LOW (ref 6.5–8.1)

## 2015-10-19 LAB — RAPID URINE DRUG SCREEN, HOSP PERFORMED
Amphetamines: NOT DETECTED
Barbiturates: POSITIVE — AB
Benzodiazepines: NOT DETECTED
Cocaine: NOT DETECTED
Opiates: POSITIVE — AB
Tetrahydrocannabinol: NOT DETECTED

## 2015-10-19 LAB — CREATININE, SERUM
Creatinine, Ser: 3.46 mg/dL — ABNORMAL HIGH (ref 0.44–1.00)
GFR calc non Af Amer: 14 mL/min — ABNORMAL LOW (ref >60)
GFR, EST AFRICAN AMERICAN: 16 mL/min — AB (ref >60)

## 2015-10-19 LAB — I-STAT VENOUS BLOOD GAS, ED
Acid-base deficit: 15 mmol/L — ABNORMAL HIGH (ref 0.0–2.0)
BICARBONATE: 14.2 meq/L — AB (ref 20.0–24.0)
O2 SAT: 79 %
PCO2 VEN: 48.6 mmHg (ref 45.0–50.0)
TCO2: 16 mmol/L (ref 0–100)
pH, Ven: 7.074 — CL (ref 7.250–7.300)
pO2, Ven: 60 mmHg — ABNORMAL HIGH (ref 31.0–45.0)

## 2015-10-19 LAB — I-STAT CG4 LACTIC ACID, ED
Lactic Acid, Venous: 2.16 mmol/L (ref 0.5–2.0)
Lactic Acid, Venous: 2.16 mmol/L (ref 0.5–2.0)

## 2015-10-19 LAB — I-STAT TROPONIN, ED
Troponin i, poc: 0.58 ng/mL (ref 0.00–0.08)
Troponin i, poc: 0.74 ng/mL (ref 0.00–0.08)

## 2015-10-19 LAB — SALICYLATE LEVEL: Salicylate Lvl: <4 mg/dL (ref 2.8–30.0)

## 2015-10-19 LAB — URINE MICROSCOPIC-ADD ON

## 2015-10-19 LAB — ETHANOL: ALCOHOL ETHYL (B): 8 mg/dL — AB (ref <5)

## 2015-10-19 LAB — CBC
HCT: 33.4 % — ABNORMAL LOW (ref 36.0–46.0)
Hemoglobin: 10.8 g/dL — ABNORMAL LOW (ref 12.0–15.0)
MCH: 31.6 pg (ref 26.0–34.0)
MCHC: 32.3 g/dL (ref 30.0–36.0)
MCV: 97.7 fL (ref 78.0–100.0)
PLATELETS: 231 10*3/uL (ref 150–400)
RBC: 3.42 MIL/uL — ABNORMAL LOW (ref 3.87–5.11)
RDW: 13.7 % (ref 11.5–15.5)
WBC: 11.2 10*3/uL — AB (ref 4.0–10.5)

## 2015-10-19 LAB — ACETAMINOPHEN LEVEL: Acetaminophen (Tylenol), Serum: <10 ug/mL — ABNORMAL LOW (ref 10–30)

## 2015-10-19 LAB — CBC WITH DIFFERENTIAL/PLATELET
Basophils Absolute: 0.1 10*3/uL (ref 0.0–0.1)
Basophils Relative: 2 %
Eosinophils Absolute: 0 10*3/uL (ref 0.0–0.7)
Eosinophils Relative: 0 %
HEMATOCRIT: 34.8 % — AB (ref 36.0–46.0)
HEMOGLOBIN: 11.4 g/dL — AB (ref 12.0–15.0)
LYMPHS ABS: 0.3 10*3/uL — AB (ref 0.7–4.0)
LYMPHS PCT: 4 %
MCH: 32 pg (ref 26.0–34.0)
MCHC: 32.8 g/dL (ref 30.0–36.0)
MCV: 97.8 fL (ref 78.0–100.0)
MONO ABS: 0.5 10*3/uL (ref 0.1–1.0)
MONOS PCT: 8 %
NEUTROS ABS: 6.2 10*3/uL (ref 1.7–7.7)
NEUTROS PCT: 86 %
Platelets: 193 10*3/uL (ref 150–400)
RBC: 3.56 MIL/uL — ABNORMAL LOW (ref 3.87–5.11)
RDW: 13.6 % (ref 11.5–15.5)
WBC: 7.1 10*3/uL (ref 4.0–10.5)

## 2015-10-19 LAB — GLUCOSE, CAPILLARY: Glucose-Capillary: 163 mg/dL — ABNORMAL HIGH (ref 65–99)

## 2015-10-19 LAB — LIPASE, BLOOD: LIPASE: 46 U/L (ref 11–51)

## 2015-10-19 LAB — CK: Total CK: 1935 U/L — ABNORMAL HIGH (ref 38–234)

## 2015-10-19 LAB — AMMONIA: Ammonia: 70 umol/L — ABNORMAL HIGH (ref 9–35)

## 2015-10-19 MED ORDER — FOLIC ACID 1 MG PO TABS
1.0000 mg | ORAL_TABLET | Freq: Every day | ORAL | Status: DC
  Administered 2015-10-20 – 2015-10-27 (×8): 1 mg via ORAL
  Filled 2015-10-19 (×9): qty 1

## 2015-10-19 MED ORDER — SODIUM CHLORIDE 0.9 % IV SOLN
1.5000 g | Freq: Once | INTRAVENOUS | Status: DC
  Filled 2015-10-19: qty 1.5

## 2015-10-19 MED ORDER — ADULT MULTIVITAMIN W/MINERALS CH
1.0000 | ORAL_TABLET | Freq: Every day | ORAL | Status: DC
  Administered 2015-10-20 – 2015-10-27 (×8): 1 via ORAL
  Filled 2015-10-19 (×8): qty 1

## 2015-10-19 MED ORDER — ASPIRIN 300 MG RE SUPP: 300.0000 mg | RECTAL | Status: DC

## 2015-10-19 MED ORDER — SODIUM BICARBONATE 8.4 % IV SOLN
INTRAVENOUS | Status: DC
  Administered 2015-10-20 (×4): via INTRAVENOUS
  Filled 2015-10-19 (×5): qty 150

## 2015-10-19 MED ORDER — SODIUM CHLORIDE 0.9 % IV BOLUS (SEPSIS)
1000.0000 mL | Freq: Once | INTRAVENOUS | Status: AC
  Administered 2015-10-19: 1000 mL via INTRAVENOUS

## 2015-10-19 MED ORDER — VITAMIN B-1 100 MG PO TABS
100.0000 mg | ORAL_TABLET | Freq: Every day | ORAL | Status: DC
  Administered 2015-10-20 – 2015-10-27 (×8): 100 mg via ORAL
  Filled 2015-10-19 (×9): qty 1

## 2015-10-19 MED ORDER — HEPARIN SODIUM (PORCINE) 5000 UNIT/ML IJ SOLN
5000.0000 [IU] | Freq: Three times a day (TID) | INTRAMUSCULAR | Status: DC
  Administered 2015-10-19 – 2015-10-27 (×23): 5000 [IU] via SUBCUTANEOUS
  Filled 2015-10-19 (×25): qty 1

## 2015-10-19 MED ORDER — NOREPINEPHRINE BITARTRATE 1 MG/ML IV SOLN
0.0000 ug/min | Freq: Once | INTRAVENOUS | Status: AC
  Administered 2015-10-19: 6 ug/min via INTRAVENOUS
  Filled 2015-10-19: qty 4

## 2015-10-19 MED ORDER — DEXTROSE 5 % IV SOLN
500.0000 mg | Freq: Once | INTRAVENOUS | Status: AC
  Administered 2015-10-19: 500 mg via INTRAVENOUS
  Filled 2015-10-19: qty 500

## 2015-10-19 MED ORDER — ASPIRIN 81 MG PO CHEW: 324.0000 mg | CHEWABLE_TABLET | ORAL | Status: DC

## 2015-10-19 MED ORDER — PANTOPRAZOLE SODIUM 40 MG PO TBEC
40.0000 mg | DELAYED_RELEASE_TABLET | Freq: Every day | ORAL | Status: DC
  Administered 2015-10-20 – 2015-10-27 (×8): 40 mg via ORAL
  Filled 2015-10-19 (×8): qty 1

## 2015-10-19 MED ORDER — SODIUM CHLORIDE 0.9 % IV SOLN
250.0000 mL | INTRAVENOUS | Status: DC | PRN
  Administered 2015-10-21 – 2015-10-22 (×2): 250 mL via INTRAVENOUS

## 2015-10-19 MED ORDER — THIAMINE HCL 100 MG/ML IJ SOLN
Freq: Once | INTRAVENOUS | Status: AC
  Administered 2015-10-19: 22:00:00 via INTRAVENOUS
  Filled 2015-10-19: qty 1000

## 2015-10-19 MED ORDER — ASPIRIN 300 MG RE SUPP
300.0000 mg | Freq: Once | RECTAL | Status: AC
  Administered 2015-10-19: 300 mg via RECTAL
  Filled 2015-10-19: qty 1

## 2015-10-19 NOTE — ED Notes (Signed)
Pt arrived via EMS from home c/o wheezing, hypotension, possible drug overdose.  EMS gave 1.5mg  Narcan, 544ml NS and albuterol treatment.  Pupils pinpoint.

## 2015-10-19 NOTE — Progress Notes (Signed)
[facility] Progress Note Patient Name: [name] DOB: December 23, 1960 MRN: [id number]   Date of Service  10/19/2015  HPI/Events of Note  metab + resp acidosis on ABG  eICU Interventions  Bicarb gtt     Intervention Category Major Interventions: Acid-Base disturbance - evaluation and management  Isadore Palecek V. 10/19/2015, 10:31 PM

## 2015-10-19 NOTE — H&P (Signed)
PULMONARY / CRITICAL CARE MEDICINE   Name: [name] MRN: [id number] DOB: 11-21-1960    ADMISSION DATE:  10/19/2015 CONSULTATION DATE:  10/19/15  REFERRING MD:  EDP  CHIEF COMPLAINT:  Altered mental status, low blood pressure  HISTORY OF PRESENT ILLNESS:   [name] is a 41F who presented to the ED after being found down for an unknown period of time by her daughter. She was very somnolent and difficult to arouse. When EMS arrived, she was noted to be hypotensive and wheezy with pinpoint pupils. There was some question of narcotic use/abuse. She was given Narcan en route as well as 500cc normal saline. On arrival to the ED she continued to be hypotensive, but was more arousable, although she was unable to provide any clear history.   She is deaf and neither an interpreter nor family was available to assist with my interview. A central line was placed by the EDP and pressors were initiated.   Initial labs were notable for a metabolic acidosis with HCO3 14, AKI with cr 3.59 / BUN 41 (unknown baseline). AST / ALT / AlkP were 4887 / 2285 and 227 respectively; unclear if shock liver or EtOH hepatitis. Tbili normal at 1.1. Lactate 2.16. Anion gap 19. CBC relatively unremarkable with normal WBC, mild left shift (86% PMNs), mild normocytic anemia at 11.4. Initial Troponin 0.58  Blood cultures were sent. CXR showed possible LUL opacity cardiomegaly and a prosthetic valve. CT head with NAICA. She was given Unasyn and azithromycin in the ED.  Additional labs including EtOH, acetaminophen, ASA, UA, UDS are pending.   Her daughter had not been home since Friday (6/9) and, upon arriving home today, found her mother in her bed (the daughter's bed), difficult to arouse with sweat-soaked sheets. She startled awake, but was incoherent. No evidence of drug use or EtOH. Her daughter states she has been trying to stop drinking and reportedly had been sober for about 2 weeks.   PAST MEDICAL HISTORY :  She  has  no past medical history on file.  See other patient chart (marked for merge)  PAST SURGICAL HISTORY: She  has past surgical history that includes Cardiac surgery. See other patient chart (marked for merge)  No Known Allergies See other patient chart (marked for merge)  No current facility-administered medications on file prior to encounter.   No current outpatient prescriptions on file prior to encounter.  See other patient chart (marked for merge)  FAMILY HISTORY:  Her has no family status information on file.   See other patient chart (marked for merge)  SOCIAL HISTORY: She  reports that she drinks alcohol.  REVIEW OF SYSTEMS:   Unable to obtain  SUBJECTIVE:  Drowsy. Poor attention.   VITAL SIGNS: BP 72/55 mmHg  Pulse 77  Temp(Src) 98.5 F (36.9 C) (Oral)  Resp 23  SpO2 95%  HEMODYNAMICS:    VENTILATOR SETTINGS:    INTAKE / OUTPUT:    PHYSICAL EXAMINATION:  General Well nourished, well developed, no apparent distress, sleepy  HEENT Cochlear implant R temporal region. Otherwise grossly normal. Oropharynx not assessed.   Pulmonary Clear to auscultation bilaterally with no wheezes, rales or ronchi. Adequate effort, symmetrical expansion.   Cardiovascular Normal rate, regular rhythm. S1, s2. Audible prosthetic valve. No m/r/g. Distal pulses palpable.  Abdomen Soft, mildly tender diffusely. Mildly distended, positive bowel sounds, no palpable organomegaly or masses. Normoresonant to percussion.  Musculoskeletal Grossly normal.   Lymphatics No cervical, supraclavicular or axillary adenopathy.   Neurologic  Grossly intact. No focal deficits.   Skin/Integuement No rash, no cyanosis, no clubbing. No edema.      LABS:  BMET  Recent Labs Lab 10/19/15 1814  NA 136  K 4.0  CL 103  CO2 14*  BUN 41*  CREATININE 3.59*  GLUCOSE 92    Electrolytes  Recent Labs Lab 10/19/15 1814  CALCIUM 7.2*    CBC  Recent Labs Lab 10/19/15 1814  WBC 7.1  HGB  11.4*  HCT 34.8*  PLT 193    Coag's No results for input(s): APTT, INR in the last 168 hours.  Sepsis Markers  Recent Labs Lab 10/19/15 1822  LATICACIDVEN 2.16*    ABG No results for input(s): PHART, PCO2ART, PO2ART in the last 168 hours.  Liver Enzymes  Recent Labs Lab 10/19/15 1814  AST 4887*  ALT 2285*  ALKPHOS 227*  BILITOT 1.1  ALBUMIN 2.9*    Cardiac Enzymes No results for input(s): TROPONINI, PROBNP in the last 168 hours.  Glucose No results for input(s): GLUCAP in the last 168 hours.  Imaging Dg Chest 2 View  10/19/2015  CLINICAL DATA:  Wheezing, hypotension, possible drug overdose, altered mental status EXAM: CHEST  2 VIEW COMPARISON:  None. FINDINGS: Mild patchy opacity in the left upper lobe, suspicious for pneumonia, possibly on the basis of aspiration. Mild bibasilar opacities, possibly atelectasis. No definite pleural effusions. Cardiomegaly.  Prosthetic valve. Degenerative changes the visualized thoracolumbar spine, with compression fracture deformity at T12. Median sternotomy. IMPRESSION: Mild patchy left upper lobe opacity, suspicious for pneumonia, possibly on the basis of aspiration. Electronically Signed   By: Julian Hy M.D.   On: 10/19/2015 17:39   Ct Head Wo Contrast  10/19/2015  CLINICAL DATA:  Altered mental status.  Possible drug overdose. EXAM: CT HEAD WITHOUT CONTRAST TECHNIQUE: Contiguous axial images were obtained from the base of the skull through the vertex without intravenous contrast. COMPARISON:  None. FINDINGS: Metallic hearing aid device on the right, with associated streak artifacts. Diffusely enlarged ventricles and subarachnoid spaces. Patchy white matter low density in both cerebral hemispheres. No intracranial hemorrhage, mass lesion or CT evidence of acute infarction. Right maxillary sinus mucosal thickening. Unremarkable bones. IMPRESSION: 1. Streak artifacts from a hearing aid device, obscuring some of the intracranial  detail. 2. No acute abnormality seen. 3. Mild to moderate atrophy and moderate chronic small vessel white matter ischemic changes in both cerebral hemispheres. 4. Chronic right maxillary sinusitis. Electronically Signed   By: Claudie Revering M.D.   On: 10/19/2015 17:50   STUDIES:  As above  CULTURES: 6/11 Blood cx >> 6/11 Urine  ANTIBIOTICS: Unasyn 6/11 >> Azithromycin 6/11 >>  SIGNIFICANT EVENTS:  LINES/TUBES: RIJ central line 6/11 >> Foley 6/11  DISCUSSION: Ms. Denino is 89F w/ hx significant for alcoholism, major depressive disorder, deafness, prior ASD repair and MVR who presents with altered mental status and shock of unclear etiology. She has a mildly elevated lactate, AKI, acute liver injury and is quite somnolent, although it is difficult to assess a possible encephalopathy with the communication barrier. Her mild troponin leak is likely secondary to her prolonged hypotension, as is her AKI and liver injury. She is status post 3-4L IVF and appears relatively euvolemic on exam. There is concern for some toxic ingestion, but her response to narcan and her uds are not overly impressive. I doubt an infectious etiology here and, based on her history, suspect she may have binged on alcohol or other substance while her daughter was away  from home.   ASSESSMENT / PLAN:  PULMONARY A: Possible infiltrate on CXR / concern for aspiration ? Hx sleep apnea P:   Continue antibiotics pending additional culture data Pulmonary toilet  CARDIOVASCULAR A:  Shock - undifferentiated - hypovolemic vs cardiogenic vs septic Troponin leak - likely 2/2 shock state History of diastolic dysfunction Hx prior ASD repair / MVR Hx cardiomegaly P:  Volume resuscitate Pressor support with levophed for MAP > 65 Monitor for s/s of CHF Repeat TTE in AM Trend troponin  RENAL A:   Acute kidney injury Metabolic acidosis (lactic +/- ketoacidosis) P:   Continue fluid resuscitation Avoid  nephrotoxins Await UDS Place Foley Strict I/Os Trend lactate Check CPK  GASTROINTESTINAL A:   Acute hepatitis (shock liver vs EtOH vs other) P:   Trend LFTs Check ammonia Empiric IVF w/ thiamine / folate / MVI Daily thiamine / folate / MVI  HEMATOLOGIC A:   No acute issues P:   INFECTIOUS A:   No overt evidence of infection, but patchy opacity concerning for aspiration on CXR - s/p Unasyn and Azithromycin in the ED P:   Continue antibiotics for now, but doubt sepsis UA benign Await blood cultures  ENDOCRINE A:   No acute issues P:    NEUROLOGIC A:   Somnolent P:   RASS goal: 0 Follow clinically Await EtOH level - may need CIWA   FAMILY  - Updates: son and daughter via phone  - Inter-disciplinary family meet or Palliative Care meeting due by:  day 7  The patient is critically ill with multiple organ system failure and requires high complexity decision making for assessment and support, frequent evaluation and titration of therapies, advanced monitoring, review of radiographic studies and interpretation of complex data.   Critical Care Time devoted to patient care services, exclusive of separately billable procedures, described in this note is 65 minutes.   Yisroel Ramming, MD [facility] and [place] Pager: [phone]  10/19/2015, 8:23 PM

## 2015-10-19 NOTE — ED Notes (Signed)
Report attempted 

## 2015-10-19 NOTE — ED Notes (Signed)
Central line okay to use per Dr. Billy Fischer

## 2015-10-19 NOTE — ED Notes (Signed)
Contact Daughter, Thao Mansell with room number on admission or with any other questions or updates at [phone].  Also, son,Jasper Escandon can be contacted at [phone].  Daughter-In-Law Karie Kirks at [phone]

## 2015-10-19 NOTE — ED Provider Notes (Signed)
CSN: [id number]     Arrival date & time 10/19/15  28 History   First MD Initiated Contact with Patient 10/19/15 1638     Chief Complaint  Patient presents with  . Hypotension  . Drug Overdose     (Consider location/radiation/quality/duration/timing/severity/associated sxs/prior Treatment) HPI   Per daughter pt didn't sleep Friday because of headache, seemed sleepy on Saturday, would fall asleep. Pt reported she didn't feel well but was not specific. Daughter got home and found her sleepy, laying in daughter's bed, stated daughters name but otherwise seemed sleepy, wouldn't move. She woke up "freaked out" then immediately went back to sleep.  Pt told her (daughter) she took a new pain medication that made her groggy and told her daughter she wasn't going to take it any more.  Pt reports she is unsure why she is in ED.    EMS gave narcan 1.5, daughter stastes she appears more awake now than she was. History is limited by patient being deaf and altered mental status.  At one point states "i'm hurting" and when asked where she points to her chest, however when asked later denies any chest pain.  Does report cough for a few days. Denies fevers.  She does answer when asked which medications she took this AM or if there was another ingestion.  Daughter reports pt last drink was 2 weeks ago.  History reviewed. No pertinent past medical history. Past Surgical History  Procedure Laterality Date  . Cardiac surgery     History reviewed. No pertinent family history. Social History  Substance Use Topics  . Smoking status: Unknown If Ever Smoked  . Smokeless tobacco: None  . Alcohol Use: Yes   OB History    No data available     Review of Systems  Unable to perform ROS: Mental status change  Constitutional: Positive for fatigue. Negative for fever.  Respiratory: Positive for cough. Negative for shortness of breath.   Gastrointestinal: Positive for nausea and vomiting.      Allergies   Review of patient's allergies indicates no known allergies.  Home Medications   Prior to Admission medications   Medication Sig Start Date End Date Taking? Authorizing Provider  amoxicillin-clavulanate (AUGMENTIN) 500-125 MG tablet Take 1 tablet by mouth 3 (three) times daily. Unknown start date   Yes Historical Provider, MD  aspirin 81 MG chewable tablet Chew 81 mg by mouth daily.   Yes Historical Provider, MD  carvedilol (COREG) 25 MG tablet Take 25 mg by mouth 2 (two) times daily with a meal.   Yes Historical Provider, MD  Cholecalciferol (VITAMIN D3) 2000 units TABS Take 1 tablet by mouth daily.   Yes Historical Provider, MD  fluconazole (DIFLUCAN) 100 MG tablet Take 100 mg by mouth daily.   Yes Historical Provider, MD  folic acid (FOLVITE) 1 MG tablet Take 1 mg by mouth daily.   Yes Historical Provider, MD  gabapentin (NEURONTIN) 100 MG capsule Take 100 mg by mouth See admin instructions. Take 1 capsule by mouth as needed for headache. Twice. Limit use 1-2 days/Week   Yes Historical Provider, MD  gabapentin (NEURONTIN) 600 MG tablet Take 600 mg by mouth daily.   Yes Historical Provider, MD  hydrochlorothiazide (MICROZIDE) 12.5 MG capsule Take 12.5 mg by mouth daily.   Yes Historical Provider, MD  hydroxypropyl methylcellulose / hypromellose (ISOPTO TEARS / GONIOVISC) 2.5 % ophthalmic solution Place 1 drop into both eyes daily as needed for dry eyes.   Yes Historical Provider, MD  Omega-3 Fatty Acids (FISH OIL) 1000 MG CAPS Take 1 capsule by mouth daily.   Yes Historical Provider, MD  ondansetron (ZOFRAN-ODT) 4 MG disintegrating tablet Take 4 mg by mouth every 6 (six) hours as needed for nausea or vomiting.   Yes Historical Provider, MD  ondansetron (ZOFRAN-ODT) 8 MG disintegrating tablet Take 8 mg by mouth every 8 (eight) hours as needed for nausea or vomiting.   Yes Historical Provider, MD  OVER THE COUNTER MEDICATION Medication: Mouth sore relief liquid professional strength.   Sig: Dry  affected area and apply medication undiluted with applicator. Use up to 4 times daily, or as directed by a dentist or doctor.   Yes Historical Provider, MD  oxymetazoline (AFRIN) 0.05 % nasal spray Place 1 spray into both nostrils 2 (two) times daily as needed for congestion.   Yes Historical Provider, MD  pantoprazole (PROTONIX) 40 MG tablet Take 40 mg by mouth daily.   Yes Historical Provider, MD  prazosin (MINIPRESS) 2 MG capsule Take 2 mg by mouth at bedtime.   Yes Historical Provider, MD  ranitidine (ZANTAC) 150 MG tablet Take 150 mg by mouth 2 (two) times daily.   Yes Historical Provider, MD  trolamine salicylate (ASPERCREME) 10 % cream Apply 1 application topically daily as needed for muscle pain.   Yes Historical Provider, MD  Vilazodone HCl (VIIBRYD) 40 MG TABS Take 40 mg by mouth daily.   Yes Historical Provider, MD  vitamin B-12 (CYANOCOBALAMIN) 500 MCG tablet Take 500 mcg by mouth daily.   Yes Historical Provider, MD   BP 89/61 mmHg  Pulse 73  Temp(Src) 98.5 F (36.9 C) (Oral)  Resp 13  Ht 5\' 5"  (1.651 m)  Wt 142 lb 3.2 oz (64.5 kg)  BMI 23.66 kg/m2  SpO2 95% Physical Exam  Constitutional: She appears well-developed and well-nourished. She appears ill. No distress.  HENT:  Head: Normocephalic and atraumatic.  Eyes: Conjunctivae and EOM are normal. Pupils are equal, round, and reactive to light.  miosis  Neck: Normal range of motion.  Cardiovascular: Normal rate, regular rhythm, normal heart sounds and intact distal pulses.  Exam reveals no gallop and no friction rub.   No murmur heard. Pulmonary/Chest: Effort normal. No respiratory distress. She has no wheezes. She has rhonchi (bilateral lower). She has no rales.  Abdominal: Soft. She exhibits no distension. There is no tenderness. There is no guarding.  Musculoskeletal: She exhibits no edema or tenderness.  Neurological: She is alert. GCS eye subscore is 3.  Patient wakes easily to voice (when spoken into ear given  deaf/cochlear implant), at times answers questions with oriented response, other times becomes distracted although difficult to discern hearing difficulties from AMS (will sometimes repeat "what?" when asking questions and will other times answer)  Skin: Skin is warm and dry. No rash noted. She is not diaphoretic. No erythema.  Nursing note and vitals reviewed.   ED Course  .Central Line Date/Time: 10/20/2015 1:14 AM Performed by: Gareth Morgan Authorized by: Gareth Morgan Consent: The procedure was performed in an emergent situation. Required items: required blood products, implants, devices, and special equipment available Time out: Immediately prior to procedure a "time out" was called to verify the correct patient, procedure, equipment, support staff and site/side marked as required. Anesthesia: local infiltration Location details: right internal jugular Catheter size: 7.5 Fr Ultrasound guidance: yes Sterile ultrasound techniques: sterile gel and sterile probe covers were used Number of attempts: 1 Successful placement: yes Post-procedure: line sutured and dressing applied Assessment:  blood return through all ports,  free fluid flow,  placement verified by x-ray and no pneumothorax on x-ray Patient tolerance: Patient tolerated the procedure well with no immediate complications   (including critical care time) Labs Review Labs Reviewed  CBC WITH DIFFERENTIAL/PLATELET - Abnormal; Notable for the following:    RBC 3.56 (*)    Hemoglobin 11.4 (*)    HCT 34.8 (*)    Lymphs Abs 0.3 (*)    All other components within normal limits  COMPREHENSIVE METABOLIC PANEL - Abnormal; Notable for the following:    CO2 14 (*)    BUN 41 (*)    Creatinine, Ser 3.59 (*)    Calcium 7.2 (*)    Total Protein 5.4 (*)    Albumin 2.9 (*)    AST 4887 (*)    ALT 2285 (*)    Alkaline Phosphatase 227 (*)    GFR calc non Af Amer 13 (*)    GFR calc Af Amer 15 (*)    Anion gap 19 (*)    All other  components within normal limits  URINALYSIS, ROUTINE W REFLEX MICROSCOPIC (NOT AT [facility]) - Abnormal; Notable for the following:    Color, Urine AMBER (*)    Hgb urine dipstick MODERATE (*)    Bilirubin Urine MODERATE (*)    Protein, ur 30 (*)    All other components within normal limits  ETHANOL - Abnormal; Notable for the following:    Alcohol, Ethyl (B) 8 (*)    All other components within normal limits  URINE RAPID DRUG SCREEN, HOSP PERFORMED - Abnormal; Notable for the following:    Opiates POSITIVE (*)    Barbiturates POSITIVE (*)    All other components within normal limits  ACETAMINOPHEN LEVEL - Abnormal; Notable for the following:    Acetaminophen (Tylenol), Serum <10 (*)    All other components within normal limits  URINE MICROSCOPIC-ADD ON - Abnormal; Notable for the following:    Squamous Epithelial / LPF 0-5 (*)    Bacteria, UA MANY (*)    All other components within normal limits  CBC - Abnormal; Notable for the following:    WBC 11.2 (*)    RBC 3.42 (*)    Hemoglobin 10.8 (*)    HCT 33.4 (*)    All other components within normal limits  CREATININE, SERUM - Abnormal; Notable for the following:    Creatinine, Ser 3.46 (*)    GFR calc non Af Amer 14 (*)    GFR calc Af Amer 16 (*)    All other components within normal limits  CK - Abnormal; Notable for the following:    Total CK 1935 (*)    All other components within normal limits  AMMONIA - Abnormal; Notable for the following:    Ammonia 70 (*)    All other components within normal limits  GLUCOSE, CAPILLARY - Abnormal; Notable for the following:    Glucose-Capillary 163 (*)    All other components within normal limits  I-STAT TROPOININ, ED - Abnormal; Notable for the following:    Troponin i, poc 0.58 (*)    All other components within normal limits  I-STAT CG4 LACTIC ACID, ED - Abnormal; Notable for the following:    Lactic Acid, Venous 2.16 (*)    All other components within normal limits  I-STAT  TROPOININ, ED - Abnormal; Notable for the following:    Troponin i, poc 0.74 (*)    All other components within normal limits  I-STAT CG4 LACTIC ACID, ED - Abnormal; Notable for the following:    Lactic Acid, Venous 2.16 (*)    All other components within normal limits  I-STAT VENOUS BLOOD GAS, ED - Abnormal; Notable for the following:    pH, Ven 7.074 (*)    pO2, Ven 60.0 (*)    Bicarbonate 14.2 (*)    Acid-base deficit 15.0 (*)    All other components within normal limits  CULTURE, BLOOD (ROUTINE X 2)  CULTURE, BLOOD (ROUTINE X 2)  MRSA PCR SCREENING  URINE CULTURE  LIPASE, BLOOD  SALICYLATE LEVEL  CBC  MAGNESIUM  PHOSPHORUS  COMPREHENSIVE METABOLIC PANEL  I-STAT TROPOININ, ED    Imaging Review Dg Chest 2 View  10/19/2015  CLINICAL DATA:  Wheezing, hypotension, possible drug overdose, altered mental status EXAM: CHEST  2 VIEW COMPARISON:  None. FINDINGS: Mild patchy opacity in the left upper lobe, suspicious for pneumonia, possibly on the basis of aspiration. Mild bibasilar opacities, possibly atelectasis. No definite pleural effusions. Cardiomegaly.  Prosthetic valve. Degenerative changes the visualized thoracolumbar spine, with compression fracture deformity at T12. Median sternotomy. IMPRESSION: Mild patchy left upper lobe opacity, suspicious for pneumonia, possibly on the basis of aspiration. Electronically Signed   By: Julian Hy M.D.   On: 10/19/2015 17:39   Ct Head Wo Contrast  10/19/2015  CLINICAL DATA:  Altered mental status.  Possible drug overdose. EXAM: CT HEAD WITHOUT CONTRAST TECHNIQUE: Contiguous axial images were obtained from the base of the skull through the vertex without intravenous contrast. COMPARISON:  None. FINDINGS: Metallic hearing aid device on the right, with associated streak artifacts. Diffusely enlarged ventricles and subarachnoid spaces. Patchy white matter low density in both cerebral hemispheres. No intracranial hemorrhage, mass lesion or CT  evidence of acute infarction. Right maxillary sinus mucosal thickening. Unremarkable bones. IMPRESSION: 1. Streak artifacts from a hearing aid device, obscuring some of the intracranial detail. 2. No acute abnormality seen. 3. Mild to moderate atrophy and moderate chronic small vessel white matter ischemic changes in both cerebral hemispheres. 4. Chronic right maxillary sinusitis. Electronically Signed   By: Claudie Revering M.D.   On: 10/19/2015 17:50   Dg Chest Portable 1 View  10/19/2015  CLINICAL DATA:  Central line placement EXAM: PORTABLE CHEST 1 VIEW COMPARISON:  10/19/2015 FINDINGS: Interval placement of a right central venous catheter with tip over the mid SVC region. No pneumothorax. Postoperative changes in the mediastinum. Shallow inspiration. Linear atelectasis or infiltration in the left upper lung similar to prior study. No blunting of costophrenic angles. No pneumothorax. Mild cardiac enlargement. IMPRESSION: Right central venous catheter placed with tip over the mid SVC region. No pneumothorax. Persistent infiltration or atelectasis in the left upper lung. Electronically Signed   By: Lucienne Capers M.D.   On: 10/19/2015 21:42   I have personally reviewed and evaluated these images and lab results as part of my medical decision-making.   EKG Interpretation   Date/Time:  Sunday October 19 2015 17:59:19 EDT Ventricular Rate:  81 PR Interval:  189 QRS Duration: 95 QT Interval:  434 QTC Calculation: 504 R Axis:   77 Text Interpretation:  Sinus rhythm Borderline low voltage, extremity leads  Borderline prolonged QT interval No significant change since last tracing  Confirmed by [facility] MD, [place] ([id number]) on 10/19/2015 6:47:32 PM      CRITICAL CARE: Shock Performed by: Alvino Chapel   Total critical care time: 30 minutes  Critical care time was exclusive of separately billable procedures and treating  other patients.  Critical care was necessary to treat or prevent  imminent or life-threatening deterioration.  Critical care was time spent personally by me on the following activities: development of treatment plan with patient and/or surrogate as well as nursing, discussions with consultants, evaluation of patient's response to treatment, examination of patient, obtaining history from patient or surrogate, ordering and performing treatments and interventions, ordering and review of laboratory studies, ordering and review of radiographic studies, pulse oximetry and re-evaluation of patient's condition.  MDM   Final diagnoses:  Shock ([place])  Hypotension, unspecified hypotension type  Transaminitis  Lactic acidosis  Acute renal failure, unspecified acute renal failure type (HCC)  Metabolic acidosis  Aspiration pneumonia of left upper lobe, unspecified aspiration pneumonia type ([place])  Elevated troponin   55 year old female with a history of alcohol abuse, fibromyalgia, mitral valve and ASD repair, deaf with cochlear implant, frequent emergency department visits for abdominal pain, nausea and vomiting presents with concern for altered mental status, hypotension.  Patient with low systolic blood pressures on arrival to the emergency department, however maps around 60-70, with patient answering questions and fluids ordered without immediate initiation of pressors.  Blood cultures were ordered, however history, while limited, did not initially suggest sepsis as etiology of hypotension.  Patient with normal HR, blood pressures low, and given history that patient had told her daughter previously that she taken pain medications that made her sleepy, as well as history that patient was more lethargic at home, and had improved following Narcan with EMS, suspect drug or medication misuse is likely etiology of patient's hypotension. Chest x-ray, however showed possible aspiration pneumonia, and patient was given ampicillin sulbactam and azithromycin for coverage of aspiration  pneumonia and community acquired pneumonia. Lactic acid elevated to 2.16.  Patient received 4L of NS, and while initially BP with stable MAPs, MAPs and pt mental status declined and central line was placed for initiation of levophed.   Troponin also elevated, indicating possible cardiac etiology of symptoms versus strain from hypotension.  Consulted Critical Care. CMP returned showing AKI, AG metabolic acidosis, and severe transaminitis.  Ordered blood gas.  Would consider other ingestion as etiology.  Pt admitted to critical care for continued evaluation and treatment.   Gareth Morgan, MD 10/20/15 [phone]

## 2015-10-20 ENCOUNTER — Inpatient Hospital Stay (HOSPITAL_COMMUNITY): Payer: Medicare Other

## 2015-10-20 ENCOUNTER — Other Ambulatory Visit (HOSPITAL_COMMUNITY): Payer: Self-pay | Admitting: *Deleted

## 2015-10-20 DIAGNOSIS — I319 Disease of pericardium, unspecified: Secondary | ICD-10-CM

## 2015-10-20 DIAGNOSIS — G9340 Encephalopathy, unspecified: Secondary | ICD-10-CM

## 2015-10-20 DIAGNOSIS — R740 Nonspecific elevation of levels of transaminase and lactic acid dehydrogenase [LDH]: Secondary | ICD-10-CM

## 2015-10-20 DIAGNOSIS — M6282 Rhabdomyolysis: Secondary | ICD-10-CM

## 2015-10-20 DIAGNOSIS — N179 Acute kidney failure, unspecified: Secondary | ICD-10-CM

## 2015-10-20 LAB — CBC
HEMATOCRIT: 31.6 % — AB (ref 36.0–46.0)
HEMOGLOBIN: 10.4 g/dL — AB (ref 12.0–15.0)
MCH: 31.9 pg (ref 26.0–34.0)
MCHC: 32.9 g/dL (ref 30.0–36.0)
MCV: 96.9 fL (ref 78.0–100.0)
Platelets: 220 10*3/uL (ref 150–400)
RBC: 3.26 MIL/uL — ABNORMAL LOW (ref 3.87–5.11)
RDW: 13.9 % (ref 11.5–15.5)
WBC: 8.9 10*3/uL (ref 4.0–10.5)

## 2015-10-20 LAB — COMPREHENSIVE METABOLIC PANEL
ALBUMIN: 2.7 g/dL — AB (ref 3.5–5.0)
ALK PHOS: 213 U/L — AB (ref 38–126)
ALT: 2638 U/L — AB (ref 14–54)
AST: 9304 U/L — AB (ref 15–41)
Anion gap: 11 (ref 5–15)
BUN: 43 mg/dL — ABNORMAL HIGH (ref 6–20)
CALCIUM: 6.6 mg/dL — AB (ref 8.9–10.3)
CO2: 21 mmol/L — AB (ref 22–32)
CREATININE: 3.65 mg/dL — AB (ref 0.44–1.00)
Chloride: 105 mmol/L (ref 101–111)
GFR calc Af Amer: 15 mL/min — ABNORMAL LOW (ref >60)
GFR calc non Af Amer: 13 mL/min — ABNORMAL LOW (ref >60)
GLUCOSE: 139 mg/dL — AB (ref 65–99)
Potassium: 3.9 mmol/L (ref 3.5–5.1)
SODIUM: 137 mmol/L (ref 135–145)
Total Bilirubin: 0.8 mg/dL (ref 0.3–1.2)
Total Protein: 5.2 g/dL — ABNORMAL LOW (ref 6.5–8.1)

## 2015-10-20 LAB — ECHOCARDIOGRAM COMPLETE
Area-P 1/2: 2.42 cm2
CHL CUP MV DEC (S): 320
E/e' ratio: 25.81
EWDT: 320 ms
FS: 32 % (ref 28–44)
HEIGHTINCHES: 65 in
IVS/LV PW RATIO, ED: 0.85
LA ID, A-P, ES: 44 mm
LA diam index: 2.52 cm/m2
LA vol index: 42.2 mL/m2
LA vol: 73.7 mL
LAVOLA4C: 94 mL
LEFT ATRIUM END SYS DIAM: 44 mm
LV E/e' medial: 25.81
LV PW d: 9.68 mm — AB (ref 0.6–1.1)
LV TDI E'LATERAL: 6.2
LV TDI E'MEDIAL: 6.09
LV e' LATERAL: 6.2 cm/s
LVEEAVG: 25.81
LVOT area: 2.27 cm2
LVOTD: 17 mm
MV M vel: 85.2
MV pk A vel: 83.9 m/s
MV pk E vel: 160 m/s
MVANNULUSVTI: 34.2 cm
MVG: 3 mmHg
MVPG: 10 mmHg
MVSPHT: 91 ms
Weight: 2328.06 oz

## 2015-10-20 LAB — BLOOD CULTURE ID PANEL (REFLEXED)
ACINETOBACTER BAUMANNII: NOT DETECTED
CANDIDA GLABRATA: NOT DETECTED
CANDIDA KRUSEI: NOT DETECTED
CARBAPENEM RESISTANCE: NOT DETECTED
Candida albicans: NOT DETECTED
Candida parapsilosis: NOT DETECTED
Candida tropicalis: NOT DETECTED
ENTEROBACTERIACEAE SPECIES: NOT DETECTED
ESCHERICHIA COLI: NOT DETECTED
Enterobacter cloacae complex: NOT DETECTED
Enterococcus species: NOT DETECTED
HAEMOPHILUS INFLUENZAE: NOT DETECTED
KLEBSIELLA OXYTOCA: NOT DETECTED
Klebsiella pneumoniae: NOT DETECTED
Listeria monocytogenes: NOT DETECTED
METHICILLIN RESISTANCE: NOT DETECTED
NEISSERIA MENINGITIDIS: NOT DETECTED
PSEUDOMONAS AERUGINOSA: NOT DETECTED
Proteus species: NOT DETECTED
STAPHYLOCOCCUS SPECIES: DETECTED — AB
STREPTOCOCCUS AGALACTIAE: NOT DETECTED
STREPTOCOCCUS SPECIES: NOT DETECTED
Serratia marcescens: NOT DETECTED
Staphylococcus aureus (BCID): NOT DETECTED
Streptococcus pneumoniae: NOT DETECTED
Streptococcus pyogenes: NOT DETECTED
Vancomycin resistance: NOT DETECTED

## 2015-10-20 LAB — POCT I-STAT 3, ART BLOOD GAS (G3+)
Acid-base deficit: 3 mmol/L — ABNORMAL HIGH (ref 0.0–2.0)
Bicarbonate: 23.6 mEq/L (ref 20.0–24.0)
O2 SAT: 97 %
PCO2 ART: 47.5 mmHg — AB (ref 35.0–45.0)
PO2 ART: 99 mmHg (ref 80.0–100.0)
Patient temperature: 98.6
TCO2: 25 mmol/L (ref 0–100)
pH, Arterial: 7.305 — ABNORMAL LOW (ref 7.350–7.450)

## 2015-10-20 LAB — CK: Total CK: 2547 U/L — ABNORMAL HIGH (ref 38–234)

## 2015-10-20 LAB — MAGNESIUM: MAGNESIUM: 1.6 mg/dL — AB (ref 1.7–2.4)

## 2015-10-20 LAB — GLUCOSE, CAPILLARY
Glucose-Capillary: 131 mg/dL — ABNORMAL HIGH (ref 65–99)
Glucose-Capillary: 137 mg/dL — ABNORMAL HIGH (ref 65–99)

## 2015-10-20 LAB — LACTIC ACID, PLASMA: Lactic Acid, Venous: 1.1 mmol/L (ref 0.5–2.0)

## 2015-10-20 LAB — MRSA PCR SCREENING: MRSA by PCR: POSITIVE — AB

## 2015-10-20 LAB — PROCALCITONIN: Procalcitonin: 59.28 ng/mL

## 2015-10-20 LAB — PHOSPHORUS: PHOSPHORUS: 8.5 mg/dL — AB (ref 2.5–4.6)

## 2015-10-20 MED ORDER — CHLORHEXIDINE GLUCONATE 0.12 % MT SOLN
15.0000 mL | Freq: Two times a day (BID) | OROMUCOSAL | Status: DC
  Administered 2015-10-20 – 2015-10-22 (×4): 15 mL via OROMUCOSAL
  Filled 2015-10-20 (×2): qty 15

## 2015-10-20 MED ORDER — SODIUM CHLORIDE 0.9 % IV SOLN
INTRAVENOUS | Status: DC
  Administered 2015-10-20 – 2015-10-24 (×6): via INTRAVENOUS

## 2015-10-20 MED ORDER — MUPIROCIN 2 % EX OINT
1.0000 "application " | TOPICAL_OINTMENT | Freq: Two times a day (BID) | CUTANEOUS | Status: AC
  Administered 2015-10-20 – 2015-10-25 (×10): 1 via NASAL
  Filled 2015-10-20 (×3): qty 22

## 2015-10-20 MED ORDER — LACTULOSE 10 GM/15ML PO SOLN
20.0000 g | Freq: Two times a day (BID) | ORAL | Status: DC
  Administered 2015-10-20 – 2015-10-21 (×3): 20 g via ORAL
  Filled 2015-10-20 (×3): qty 30

## 2015-10-20 MED ORDER — CETYLPYRIDINIUM CHLORIDE 0.05 % MT LIQD
7.0000 mL | Freq: Two times a day (BID) | OROMUCOSAL | Status: DC
  Administered 2015-10-21 (×2): 7 mL via OROMUCOSAL

## 2015-10-20 MED ORDER — ONDANSETRON HCL 4 MG/2ML IJ SOLN
4.0000 mg | Freq: Three times a day (TID) | INTRAMUSCULAR | Status: DC | PRN
  Administered 2015-10-20 – 2015-10-23 (×6): 4 mg via INTRAVENOUS
  Filled 2015-10-20 (×5): qty 2

## 2015-10-20 MED ORDER — MAGNESIUM SULFATE 2 GM/50ML IV SOLN
2.0000 g | Freq: Once | INTRAVENOUS | Status: AC
  Administered 2015-10-20: 2 g via INTRAVENOUS
  Filled 2015-10-20: qty 50

## 2015-10-20 MED ORDER — SODIUM CHLORIDE 0.9 % IV BOLUS (SEPSIS)
1000.0000 mL | Freq: Once | INTRAVENOUS | Status: AC
  Administered 2015-10-20: 1000 mL via INTRAVENOUS

## 2015-10-20 MED ORDER — ONDANSETRON HCL 4 MG/2ML IJ SOLN
INTRAMUSCULAR | Status: AC
  Filled 2015-10-20: qty 2

## 2015-10-20 MED ORDER — NOREPINEPHRINE BITARTRATE 1 MG/ML IV SOLN
0.0000 ug/min | INTRAVENOUS | Status: DC
  Administered 2015-10-20 (×2): 4 ug/min via INTRAVENOUS
  Filled 2015-10-20 (×2): qty 4

## 2015-10-20 MED ORDER — DEXTROSE 5 % IV SOLN
500.0000 mg | INTRAVENOUS | Status: DC
  Administered 2015-10-20 – 2015-10-21 (×2): 500 mg via INTRAVENOUS
  Filled 2015-10-20 (×3): qty 500

## 2015-10-20 MED ORDER — SODIUM CHLORIDE 0.9 % IV SOLN
1.5000 g | Freq: Two times a day (BID) | INTRAVENOUS | Status: AC
  Administered 2015-10-20 – 2015-10-26 (×12): 1.5 g via INTRAVENOUS
  Filled 2015-10-20 (×13): qty 1.5

## 2015-10-20 MED ORDER — CHLORHEXIDINE GLUCONATE CLOTH 2 % EX PADS
6.0000 | MEDICATED_PAD | Freq: Every day | CUTANEOUS | Status: AC
  Administered 2015-10-20 – 2015-10-24 (×5): 6 via TOPICAL

## 2015-10-20 NOTE — Care Management Note (Signed)
Case Management Note  Patient Details  Name: [name] MRN: [id number] Date of Birth: 1960/12/21  Subjective/Objective:         Pt admitted with shock - found down by daughter            Action/Plan:  PTA from home with daughter.  Pt is deaf and hx of substance abuse.  CSW consulted.  CM will continue to follow for disposition needs   Expected Discharge Date:                  Expected Discharge Plan:  Home/Self Care  In-House Referral:  Clinical Social Work  Discharge planning Services  CM Consult  Post Acute Care Choice:    Choice offered to:     DME Arranged:    DME Agency:     HH Arranged:    [place] Agency:     Status of Service:  In process, will continue to follow  Medicare Important Message Given:    Date Medicare IM Given:    Medicare IM give by:    Date Additional Medicare IM Given:    Additional Medicare Important Message give by:     If discussed at [place] of Stay Meetings, dates discussed:    Additional Comments:  Maryclare Labrador, RN 10/20/2015, 11:17 AM

## 2015-10-20 NOTE — Progress Notes (Signed)
  PHARMACY - PHYSICIAN COMMUNICATION CRITICAL VALUE ALERT - BLOOD CULTURE IDENTIFICATION (BCID)  Results for orders placed or performed during the hospital encounter of 10/19/15  Blood Culture ID Panel (Reflexed) (Collected: 10/19/2015  6:11 PM)  Result Value Ref Range   Enterococcus species NOT DETECTED NOT DETECTED   Vancomycin resistance NOT DETECTED NOT DETECTED   Listeria monocytogenes NOT DETECTED NOT DETECTED   Staphylococcus species DETECTED (A) NOT DETECTED   Staphylococcus aureus NOT DETECTED NOT DETECTED   Methicillin resistance NOT DETECTED NOT DETECTED   Streptococcus species NOT DETECTED NOT DETECTED   Streptococcus agalactiae NOT DETECTED NOT DETECTED   Streptococcus pneumoniae NOT DETECTED NOT DETECTED   Streptococcus pyogenes NOT DETECTED NOT DETECTED   Acinetobacter baumannii NOT DETECTED NOT DETECTED   Enterobacteriaceae species NOT DETECTED NOT DETECTED   Enterobacter cloacae complex NOT DETECTED NOT DETECTED   Escherichia coli NOT DETECTED NOT DETECTED   Klebsiella oxytoca NOT DETECTED NOT DETECTED   Klebsiella pneumoniae NOT DETECTED NOT DETECTED   Proteus species NOT DETECTED NOT DETECTED   Serratia marcescens NOT DETECTED NOT DETECTED   Carbapenem resistance NOT DETECTED NOT DETECTED   Haemophilus influenzae NOT DETECTED NOT DETECTED   Neisseria meningitidis NOT DETECTED NOT DETECTED   Pseudomonas aeruginosa NOT DETECTED NOT DETECTED   Candida albicans NOT DETECTED NOT DETECTED   Candida glabrata NOT DETECTED NOT DETECTED   Candida krusei NOT DETECTED NOT DETECTED   Candida parapsilosis NOT DETECTED NOT DETECTED   Candida tropicalis NOT DETECTED NOT DETECTED    Name of physician (or Provider) Contacted: None  Changes to prescribed antibiotics required:   1/2 Bcx with staph species - not MRSA - already one unasyn - no change needed   [employer].D. CPP, BCPS Clinical Pharmacist [phone] 10/20/2015 10:05 PM

## 2015-10-20 NOTE — Progress Notes (Signed)
[facility] Progress Note Patient Name: Ramata Genao DOB: July 06, 1960 MRN: [id number]   Date of Service  10/20/2015  HPI/Events of Note  Notified of need to renew order for Norepinephrine IV infusion.  eICU Interventions  Will renew order for Norepinephrine IV infusion.     Intervention Category Major Interventions: Hypotension - evaluation and management  Zeina Akkerman [place] 10/20/2015, 12:20 AM

## 2015-10-20 NOTE — Progress Notes (Signed)
Echocardiogram 2D Echocardiogram has been performed.  Tresa Res 10/20/2015, 1:40 PM

## 2015-10-20 NOTE — Progress Notes (Signed)
PULMONARY / CRITICAL CARE MEDICINE   Name: [name] MRN: [id number] DOB: [birth year]    ADMISSION DATE:  10/19/2015 CONSULTATION DATE:  10/19/15  REFERRING MD:  EDP  CHIEF COMPLAINT:  Altered mental status, low blood pressure  HISTORY OF PRESENT ILLNESS:   [name] is a 55F with previous hx ETOH, mitral valve replacement, deafness who presented to the ED 6/11 with AMS.  Admitted with hypotension, suspected aspiration PNA, AKI.    SUBJECTIVE:  Drowsy. Confused.  Attempted to communicate with (limited) sign language but she remains confused, does not attempt to sign back.  Speaks well, "I want my mama".   Weaning levophed off.   VITAL SIGNS: BP 85/67 mmHg  Pulse 72  Temp(Src) 97.9 F (36.6 C) (Oral)  Resp 9  Ht 5\' 5"  (1.651 m)  Wt 66 kg (145 lb 8.1 oz)  BMI 24.21 kg/m2  SpO2 96%   HEMODYNAMICS:    VENTILATOR SETTINGS:    INTAKE / OUTPUT: I/O last 3 completed shifts: In: 1447.9 [I.V.:1447.9] Out: 175 [Urine:175]  PHYSICAL EXAMINATION:  General: chronically ill appearing female, NAD  Neuro:  Awake, confused, deaf but speaks and can hear a bit.  Clearly confused.  Repeating "I want my mama".   CV:  s1s2 rrr PULM: resps even non labored on [place], diminished bases, few scattered rhonchi  GI: round, soft, mildly tender  Extremities:  Warm and dry, no edema    LABS:  BMET  Recent Labs Lab 10/19/15 1814 10/19/15 2122 10/20/15 0448  NA 136  --  137  K 4.0  --  3.9  CL 103  --  105  CO2 14*  --  21*  BUN 41*  --  43*  CREATININE 3.59* 3.46* 3.65*  GLUCOSE 92  --  139*    Electrolytes  Recent Labs Lab 10/19/15 1814 10/20/15 0448  CALCIUM 7.2* 6.6*  MG  --  1.6*  PHOS  --  8.5*    CBC  Recent Labs Lab 10/19/15 1814 10/19/15 2122 10/20/15 0448  WBC 7.1 11.2* 8.9  HGB 11.4* 10.8* 10.4*  HCT 34.8* 33.4* 31.6*  PLT 193 231 220    Coag's No results for input(s): APTT, INR in the last 168 hours.  Sepsis Markers  Recent Labs Lab  10/19/15 1822 10/19/15 2025  LATICACIDVEN 2.16* 2.16*    ABG No results for input(s): PHART, PCO2ART, PO2ART in the last 168 hours.  Liver Enzymes  Recent Labs Lab 10/19/15 1814 10/20/15 0448  AST 4887* 9304*  ALT 2285* 2638*  ALKPHOS 227* 213*  BILITOT 1.1 0.8  ALBUMIN 2.9* 2.7*    Cardiac Enzymes No results for input(s): TROPONINI, PROBNP in the last 168 hours.  Glucose  Recent Labs Lab 10/19/15 2309  GLUCAP 163*    Imaging Dg Chest 2 View  10/19/2015  CLINICAL DATA:  Wheezing, hypotension, possible drug overdose, altered mental status EXAM: CHEST  2 VIEW COMPARISON:  None. FINDINGS: Mild patchy opacity in the left upper lobe, suspicious for pneumonia, possibly on the basis of aspiration. Mild bibasilar opacities, possibly atelectasis. No definite pleural effusions. Cardiomegaly.  Prosthetic valve. Degenerative changes the visualized thoracolumbar spine, with compression fracture deformity at T12. Median sternotomy. IMPRESSION: Mild patchy left upper lobe opacity, suspicious for pneumonia, possibly on the basis of aspiration. Electronically Signed   By: Julian Hy M.D.   On: 10/19/2015 17:39   Ct Head Wo Contrast  10/19/2015  CLINICAL DATA:  Altered mental status.  Possible drug overdose. EXAM: CT HEAD  WITHOUT CONTRAST TECHNIQUE: Contiguous axial images were obtained from the base of the skull through the vertex without intravenous contrast. COMPARISON:  None. FINDINGS: Metallic hearing aid device on the right, with associated streak artifacts. Diffusely enlarged ventricles and subarachnoid spaces. Patchy white matter low density in both cerebral hemispheres. No intracranial hemorrhage, mass lesion or CT evidence of acute infarction. Right maxillary sinus mucosal thickening. Unremarkable bones. IMPRESSION: 1. Streak artifacts from a hearing aid device, obscuring some of the intracranial detail. 2. No acute abnormality seen. 3. Mild to moderate atrophy and moderate  chronic small vessel white matter ischemic changes in both cerebral hemispheres. 4. Chronic right maxillary sinusitis. Electronically Signed   By: Claudie Revering M.D.   On: 10/19/2015 17:50   Dg Chest Portable 1 View  10/19/2015  CLINICAL DATA:  Central line placement EXAM: PORTABLE CHEST 1 VIEW COMPARISON:  10/19/2015 FINDINGS: Interval placement of a right central venous catheter with tip over the mid SVC region. No pneumothorax. Postoperative changes in the mediastinum. Shallow inspiration. Linear atelectasis or infiltration in the left upper lung similar to prior study. No blunting of costophrenic angles. No pneumothorax. Mild cardiac enlargement. IMPRESSION: Right central venous catheter placed with tip over the mid SVC region. No pneumothorax. Persistent infiltration or atelectasis in the left upper lung. Electronically Signed   By: Lucienne Capers M.D.   On: 10/19/2015 21:42   STUDIES:  As above  CULTURES: 6/11 Blood cx >> 6/11 Urine  ANTIBIOTICS: Unasyn 6/11 >> Azithromycin 6/11 >>  SIGNIFICANT EVENTS:  LINES/TUBES: RIJ central line 6/11 >> Foley 6/11  DISCUSSION: [name] is 55F w/ hx significant for alcoholism, major depressive disorder, deafness, prior ASD repair and MVR who presents with altered mental status and shock of unclear etiology. She has a mildly elevated lactate, AKI, acute liver injury and is quite somnolent, although it is difficult to assess a possible encephalopathy with the communication barrier. Her mild troponin leak is likely secondary to her prolonged hypotension, as is her AKI and liver injury. She is status post 3-4L IVF and appears relatively euvolemic on exam. There is concern for some toxic ingestion, but her response to narcan and her uds are not overly impressive. I doubt an infectious etiology here and, based on her history, suspect she may have binged on alcohol or other substance while her daughter was away from home.   ASSESSMENT /  PLAN:  PULMONARY A: Possible infiltrate on CXR / concern for aspiration ? Hx sleep apnea P:   Continue abx  Pulmonary toilet Supplemental O2 as needed to keep sats >92%  CARDIOVASCULAR A:  Shock- hypovolemic vs cardiogenic vs septic (less likely)  Troponin leak - likely 2/2 shock state History of diastolic dysfunction Hx prior ASD repair / MVR P:  Continue gentle volume  Check CVP now  Wean pressors as able for MAP >65 Monitor for s/s of CHF Echo pending  Trend troponin, lactate   RENAL A:   Acute kidney injury Metabolic acidosis (lactic +/- ketoacidosis) hypoMg  P:   Continue gentle volume -- total 4.5 L given thus far  Avoid nephrotoxins Strict I/Os Trend lactate Trend CK  Continue HCO3 gtt for now  Recheck ABG   GASTROINTESTINAL A:   Acute hepatitis (shock liver vs EtOH vs other) Hyperammonemia  P:   Trend LFTs Lactulose 20g PO BID  Trend ammonia  Daily thiamine / folate / MVI  HEMATOLOGIC A:   No acute issues P:  SQ heparin  F/u CBC   INFECTIOUS  A:   No overt evidence of infection, but patchy opacity concerning for aspiration  P:   Continue antibiotics for now, but doubt sepsis UA benign Await blood cultures Check pct   ENDOCRINE A:   No acute issues P:   Monitor glucose on chem   NEUROLOGIC A:   Somnolent P:   RASS goal: 0 Follow clinically Watch closely for ETOH withdrawal - per daughter had been trying to quit but suspect she may have had a binge while daughter gone   FAMILY  - Updates:  Son updated by RN 6/12  - Inter-disciplinary family meet or Palliative Care meeting due by:  day [place], NP 10/20/2015  9:54 AM Pager: [phone] or [phone]   Attending Note:  I have examined patient, reviewed labs, studies and notes. I have discussed the case with Shon Millet, and I agree with the data and plans as amended above. 55 yo woman with hx EtOH abuse, deafness, admitted unresponsive and hypotensive with  ARF after ? Substance use, ? EtOH use. She was likely immobile for an extended period based on mild rhabdo on labs. Also with a transaminitis in absence of hyperbilirubinemia. On eval today she is more awake, is hearing impaired but does listen and respond to loud voice. She is oliguric, is coming off norepi. We will plan volume resuscitation, wean norepi to off, follow acute renal failure and rhabdo, LFT for clearance. Decrease bicarb gtt to [facility] and follow. Continue abx for possible PNA, narrow as able.   Independent critical care time is 37 minutes.   Baltazar Apo, MD, PhD 10/20/2015, 2:06 PM [facility] [phone] or if no answer [phone]

## 2015-10-21 ENCOUNTER — Encounter (HOSPITAL_COMMUNITY): Payer: Self-pay

## 2015-10-21 ENCOUNTER — Inpatient Hospital Stay (HOSPITAL_COMMUNITY): Payer: Medicare Other

## 2015-10-21 DIAGNOSIS — R579 Shock, unspecified: Secondary | ICD-10-CM

## 2015-10-21 LAB — URINE CULTURE: Culture: NO GROWTH

## 2015-10-21 LAB — COMPREHENSIVE METABOLIC PANEL
ALBUMIN: 2.6 g/dL — AB (ref 3.5–5.0)
ALK PHOS: 194 U/L — AB (ref 38–126)
ALT: 1697 U/L — AB (ref 14–54)
ANION GAP: 12 (ref 5–15)
AST: 2796 U/L — ABNORMAL HIGH (ref 15–41)
BILIRUBIN TOTAL: 0.7 mg/dL (ref 0.3–1.2)
BUN: 51 mg/dL — AB (ref 6–20)
CALCIUM: 6.7 mg/dL — AB (ref 8.9–10.3)
CO2: 25 mmol/L (ref 22–32)
CREATININE: 4.43 mg/dL — AB (ref 0.44–1.00)
Chloride: 100 mmol/L — ABNORMAL LOW (ref 101–111)
GFR calc Af Amer: 12 mL/min — ABNORMAL LOW (ref >60)
GFR calc non Af Amer: 10 mL/min — ABNORMAL LOW (ref >60)
GLUCOSE: 115 mg/dL — AB (ref 65–99)
Potassium: 3.1 mmol/L — ABNORMAL LOW (ref 3.5–5.1)
Sodium: 137 mmol/L (ref 135–145)
TOTAL PROTEIN: 4.8 g/dL — AB (ref 6.5–8.1)

## 2015-10-21 LAB — GLUCOSE, CAPILLARY
GLUCOSE-CAPILLARY: 117 mg/dL — AB (ref 65–99)
GLUCOSE-CAPILLARY: 137 mg/dL — AB (ref 65–99)
GLUCOSE-CAPILLARY: 95 mg/dL (ref 65–99)
Glucose-Capillary: 105 mg/dL — ABNORMAL HIGH (ref 65–99)
Glucose-Capillary: 126 mg/dL — ABNORMAL HIGH (ref 65–99)
Glucose-Capillary: 99 mg/dL (ref 65–99)

## 2015-10-21 LAB — CK: Total CK: 1771 U/L — ABNORMAL HIGH (ref 38–234)

## 2015-10-21 LAB — TROPONIN I: Troponin I: 0.56 ng/mL (ref <0.031)

## 2015-10-21 LAB — PROTIME-INR
INR: 1.42 (ref 0.00–1.49)
Prothrombin Time: 17.4 seconds — ABNORMAL HIGH (ref 11.6–15.2)

## 2015-10-21 LAB — AMMONIA: AMMONIA: 52 umol/L — AB (ref 9–35)

## 2015-10-21 LAB — PROCALCITONIN: PROCALCITONIN: 62.89 ng/mL

## 2015-10-21 LAB — MAGNESIUM: Magnesium: 2.2 mg/dL (ref 1.7–2.4)

## 2015-10-21 LAB — PHOSPHORUS: Phosphorus: 5.7 mg/dL — ABNORMAL HIGH (ref 2.5–4.6)

## 2015-10-21 MED ORDER — SODIUM CHLORIDE 0.9 % IV BOLUS (SEPSIS)
1000.0000 mL | Freq: Once | INTRAVENOUS | Status: AC
  Administered 2015-10-21: 1000 mL via INTRAVENOUS

## 2015-10-21 MED ORDER — POTASSIUM CHLORIDE CRYS ER 20 MEQ PO TBCR
40.0000 meq | EXTENDED_RELEASE_TABLET | Freq: Once | ORAL | Status: AC
  Administered 2015-10-21: 40 meq via ORAL
  Filled 2015-10-21: qty 2

## 2015-10-21 MED ORDER — SODIUM CHLORIDE 0.9% FLUSH
10.0000 mL | INTRAVENOUS | Status: DC | PRN
  Administered 2015-10-21 – 2015-10-22 (×2): 10 mL via INTRAVENOUS
  Filled 2015-10-21: qty 10

## 2015-10-21 NOTE — Telephone Encounter (Signed)
Patient was a no show for her Reclast appointment this morning. I left a message for pt to call me so we can reschedule.

## 2015-10-21 NOTE — Progress Notes (Signed)
CRITICAL VALUE ALERT  Critical value received: trop 0.56  Date of notification:  10/21/15  Time of notification:  [id number]  Critical value read back: yes   Nurse who received alert:LBass  MD notified (1st page): Richardson Landry Minor  Time of first page:  [phone]  MD notified (2nd page):  Time of second page:  Responding MD:  Richardson Landry Minor  Time MD responded:  [id number]

## 2015-10-21 NOTE — Progress Notes (Signed)
PULMONARY / CRITICAL CARE MEDICINE   Name: Jazel Sudderth MRN: [id number] DOB: 21-Apr-1961    ADMISSION DATE:  10/19/2015 CONSULTATION DATE:  10/19/15  REFERRING MD:  EDP  CHIEF COMPLAINT:  Altered mental status, low blood pressure  HISTORY OF PRESENT ILLNESS:   Ms. Blyther is a 55F with previous hx ETOH, mitral valve replacement, deafness who presented to the ED 6/11 with AMS.  Admitted with hypotension, suspected aspiration PNA, AKI.    SUBJECTIVE:  Drowsy. Confused. Levophed was restarted overnight, now off. +bc sa  Oliguric renal failure  VITAL SIGNS: BP 97/63 mmHg  Pulse 69  Temp(Src) 97 F (36.1 C) (Oral)  Resp 13  Ht 5\' 5"  (1.651 m)  Wt 152 lb 1.9 oz (69 kg)  BMI 25.31 kg/m2  SpO2 98%   HEMODYNAMICS: CVP:  [15 mmHg] 15 mmHg  VENTILATOR SETTINGS:    INTAKE / OUTPUT: I/O last 3 completed shifts: In: [id number] [I.V.:3433; IV Piggyback:2400] Out: 26 [Urine:260]  PHYSICAL EXAMINATION:  General: chronically ill appearing female, NAD  Neuro:  Awake, confused, deaf but speaks and can hear a bit.  Clearly confused.   CV:  s1s2 rrr PULM: resps even non labored on [place], diminished bases, few scattered rhonchi  GI: round, soft, mildly tender  Extremities:  Warm and dry, no edema    LABS:  BMET  Recent Labs Lab 10/19/15 1814 10/19/15 2122 10/20/15 0448 10/21/15 0505  NA 136  --  137 137  K 4.0  --  3.9 3.1*  CL 103  --  105 100*  CO2 14*  --  21* 25  BUN 41*  --  43* 51*  CREATININE 3.59* 3.46* 3.65* 4.43*  GLUCOSE 92  --  139* 115*    Electrolytes  Recent Labs Lab 10/19/15 1814 10/20/15 0448 10/21/15 0505  CALCIUM 7.2* 6.6* 6.7*  MG  --  1.6* 2.2  PHOS  --  8.5* 5.7*    CBC  Recent Labs Lab 10/19/15 1814 10/19/15 2122 10/20/15 0448  WBC 7.1 11.2* 8.9  HGB 11.4* 10.8* 10.4*  HCT 34.8* 33.4* 31.6*  PLT 193 231 220    Coag's  Recent Labs Lab 10/21/15 0505  INR 1.42    Sepsis Markers  Recent Labs Lab 10/19/15 1822 10/19/15 2025  10/20/15 1045 10/21/15 0505  LATICACIDVEN 2.16* 2.16* 1.1  --   PROCALCITON  --   --  59.28 62.89    ABG  Recent Labs Lab 10/20/15 1104  PHART 7.305*  PCO2ART 47.5*  PO2ART 99.0    Liver Enzymes  Recent Labs Lab 10/19/15 1814 10/20/15 0448 10/21/15 0505  AST 4887* 9304* 2796*  ALT 2285* 2638* 1697*  ALKPHOS 227* 213* 194*  BILITOT 1.1 0.8 0.7  ALBUMIN 2.9* 2.7* 2.6*    Cardiac Enzymes No results for input(s): TROPONINI, PROBNP in the last 168 hours.  Glucose  Recent Labs Lab 10/19/15 2309 10/20/15 1613 10/20/15 2035 10/21/15 0029 10/21/15 0447 10/21/15 0846  GLUCAP 163* 131* 137* 137* 117* 105*    Imaging No results found. STUDIES:  As above  CULTURES: 6/11 Blood cx >> coag negative staph  6/11 Urine>>neg  ANTIBIOTICS: Unasyn 6/11 >> Azithromycin 6/11 >>  SIGNIFICANT EVENTS:  LINES/TUBES: RIJ central line 6/11 >> Foley 6/11  DISCUSSION: Ms. Dua is 55F w/ hx significant for alcoholism, major depressive disorder, deafness, prior ASD repair and MVR who presents with altered mental status and shock of unclear etiology. She had mildly elevated lactate, AKI, acute liver injury and was quite somnolent,  although it is difficult to assess a possible encephalopathy with the communication barrier. Her mild troponin leak is likely secondary to her prolonged hypotension, as is her AKI and liver injury. Suspect she may have binged on alcohol or other substance while her daughter was away from home.   ASSESSMENT / PLAN:  PULMONARY A: Possible infiltrate on CXR / concern for aspiration ? Hx sleep apnea P:   Continue abx  Pulmonary toilet Supplemental O2 as needed to keep sats >92% Repeat CXR on 6/14  CARDIOVASCULAR A:  Shock- hypovolemic vs cardiogenic vs septic (less likely)  Troponin leak - likely 2/2 shock state History of diastolic dysfunction Hx prior ASD repair / MVR Echo grade 2 dysfunction P:  Continue gentle volume  Follow CVP Wean  pressors as able for MAP >65, now off Monitor for s/s of CHF Trend troponin, lactate   RENAL A:   Acute kidney injury, oliguric / anuric. Likely hypoperfusion injury / ATN Metabolic acidosis (lactic +/- ketoacidosis) hypoMg  Rhabdomyolysis  P:   Continue gentle volume  Avoid nephrotoxins Strict I/Os Check renal US for completeness 6/13 Will consult Renal 6/14 if no evidence recovery   GASTROINTESTINAL A:   Acute hepatitis (shock liver vs EtOH vs other) Hyperammonemia  P:   Trend LFTs, 6/13 trending down Trend ammonia  Daily thiamine / folate / MVI  HEMATOLOGIC A:   No acute issues P:  SQ heparin  F/u CBC   INFECTIOUS A:   Patchy opacity concerning for aspiration  Coag negative staph 1 of 2 blood cx P:   Continue empiric antibiotics for now, note elevated Pct. Consider d/c azithro in absence other evidence for atypical PNA UA benign Repeat [facility]  6/13  ENDOCRINE A:   No acute issues P:   Monitor glucose on chem   NEUROLOGIC A:   Somnolent (resolved 6/13) Deaf P:   RASS goal: 0 Follow clinically Watch closely for ETOH withdrawal - per daughter had been trying to quit but suspect she may have had a binge while daughter gone Thiamine and folic acid  FAMILY  - Updates: Mother and daughter at bedside 6/13  - Inter-disciplinary family meet or Palliative Care meeting due by:  day 7  [facility] Minor ACNP [facility] PCCM Pager [phone] till 3 pm If no answer page [phone] 10/21/2015, 10:36 AM  Attending Note:  I have examined patient, reviewed labs, studies and notes. I have discussed the case with S Minor, and I agree with the data and plans as amended above. 55 yo debilitated woman, hx EtOH abuse, deafness, MVR. Found poorly responsive, hypotensive with metabolic disarray and renal failure, rhabdo. She is more alert this am, did require norepi overnight last night but now weaned to off. continues to be oliguric. We will plan to continue empiric abx, give IVF's and  follow renal and hepatic recovery. Check renal US. If no evidence recovery then will consult Renal 6/14. Independent critical care time is 36 minutes.   Baltazar Apo, MD, PhD 10/21/2015, 1:14 PM [facility] [phone] or if no answer [phone]

## 2015-10-22 ENCOUNTER — Encounter (HOSPITAL_COMMUNITY): Payer: Self-pay | Admitting: *Deleted

## 2015-10-22 ENCOUNTER — Inpatient Hospital Stay (HOSPITAL_COMMUNITY): Payer: Medicare Other

## 2015-10-22 LAB — BASIC METABOLIC PANEL
Anion gap: 10 (ref 5–15)
BUN: 56 mg/dL — AB (ref 6–20)
CO2: 21 mmol/L — ABNORMAL LOW (ref 22–32)
CREATININE: 5.02 mg/dL — AB (ref 0.44–1.00)
Calcium: 7.8 mg/dL — ABNORMAL LOW (ref 8.9–10.3)
Chloride: 106 mmol/L (ref 101–111)
GFR calc Af Amer: 10 mL/min — ABNORMAL LOW (ref >60)
GFR, EST NON AFRICAN AMERICAN: 9 mL/min — AB (ref >60)
Glucose, Bld: 102 mg/dL — ABNORMAL HIGH (ref 65–99)
Potassium: 3.5 mmol/L (ref 3.5–5.1)
SODIUM: 137 mmol/L (ref 135–145)

## 2015-10-22 LAB — CBC
HCT: 28.2 % — ABNORMAL LOW (ref 36.0–46.0)
Hemoglobin: 9.3 g/dL — ABNORMAL LOW (ref 12.0–15.0)
MCH: 32.1 pg (ref 26.0–34.0)
MCHC: 33 g/dL (ref 30.0–36.0)
MCV: 97.2 fL (ref 78.0–100.0)
PLATELETS: 117 10*3/uL — AB (ref 150–400)
RBC: 2.9 MIL/uL — ABNORMAL LOW (ref 3.87–5.11)
RDW: 14.3 % (ref 11.5–15.5)
WBC: 3.2 10*3/uL — ABNORMAL LOW (ref 4.0–10.5)

## 2015-10-22 LAB — HEPATIC FUNCTION PANEL
ALK PHOS: 176 U/L — AB (ref 38–126)
ALT: 1127 U/L — ABNORMAL HIGH (ref 14–54)
AST: 876 U/L — ABNORMAL HIGH (ref 15–41)
Albumin: 2.4 g/dL — ABNORMAL LOW (ref 3.5–5.0)
BILIRUBIN DIRECT: 0.1 mg/dL (ref 0.1–0.5)
BILIRUBIN INDIRECT: 0.6 mg/dL (ref 0.3–0.9)
BILIRUBIN TOTAL: 0.7 mg/dL (ref 0.3–1.2)
Total Protein: 5.4 g/dL — ABNORMAL LOW (ref 6.5–8.1)

## 2015-10-22 LAB — TROPONIN I
TROPONIN I: 0.32 ng/mL — AB (ref <0.031)
TROPONIN I: 0.23 ng/mL — AB (ref <0.031)

## 2015-10-22 LAB — GLUCOSE, CAPILLARY
Glucose-Capillary: 87 mg/dL (ref 65–99)
Glucose-Capillary: 114 mg/dL — ABNORMAL HIGH (ref 65–99)

## 2015-10-22 LAB — PHOSPHORUS: PHOSPHORUS: 5.1 mg/dL — AB (ref 2.5–4.6)

## 2015-10-22 LAB — PROCALCITONIN: Procalcitonin: 36.97 ng/mL

## 2015-10-22 MED ORDER — CETYLPYRIDINIUM CHLORIDE 0.05 % MT LIQD
7.0000 mL | Freq: Two times a day (BID) | OROMUCOSAL | Status: DC
  Administered 2015-10-22 – 2015-10-24 (×5): 7 mL via OROMUCOSAL

## 2015-10-22 NOTE — Clinical Documentation Improvement (Signed)
Critical Care  Please clarify the clinical significance, if any, of the statement " Coag negative staph 1 of 2 blood cx" and document findings in next progress note. Thank you!   Sepsis ruled in and was present or evolving on admission  Sepsis ruled out. Blood Cultures likely contaminant  Other Condition  Clinically Undetermined  Supporting Information:  Has source of infection, hypotension with multi organ failure  Please exercise your independent, professional judgment when responding. A specific answer is not anticipated or expected.  Thank You, Zoila Shutter RN, BSN, [place] [phone]; Cell: [phone]

## 2015-10-22 NOTE — Progress Notes (Signed)
Report given to [facility] RN at this time

## 2015-10-22 NOTE — Progress Notes (Signed)
PULMONARY / CRITICAL CARE MEDICINE   Name: [name] MRN: [id number] DOB: [birth year]    ADMISSION DATE:  10/19/2015 CONSULTATION DATE:  10/19/15  REFERRING MD:  EDP  CHIEF COMPLAINT:  Altered mental status, low blood pressure  HISTORY OF PRESENT ILLNESS:   [name] is a 55F with previous hx ETOH, mitral valve replacement, deafness who presented to the ED 6/11 with AMS.  Admitted with hypotension, suspected aspiration PNA, AKI.    SUBJECTIVE:  MS improved today.  UOP improving also  VITAL SIGNS: BP 136/80 mmHg  Pulse 71  Temp(Src) 97.5 F (36.4 C) (Oral)  Resp 13  Ht 5\' 5"  (1.651 m)  Wt 159 lb 2.8 oz (72.2 kg)  BMI 26.49 kg/m2  SpO2 100%   HEMODYNAMICS: CVP:  [12 mmHg-16 mmHg] 14 mmHg  VENTILATOR SETTINGS:    INTAKE / OUTPUT: I/O last 3 completed shifts: In: 3908.1 [P.O.:220; I.V.:2288.1; IV Piggyback:1400] Out: 461 [Urine:461]  PHYSICAL EXAMINATION:  General: chronically ill appearing female, NAD  Neuro:  Awake, calmer, deaf but speaks and can hear a bit. Less confused.   CV:  s1s2 rrr PULM: resps even non labored on [place], diminished bases, few scattered rhonchi  GI: round, soft, mildly tender  Extremities:  Warm and dry, no edema    LABS:  BMET  Recent Labs Lab 10/20/15 0448 10/21/15 0505 10/22/15 0506  NA 137 137 137  K 3.9 3.1* 3.5  CL 105 100* 106  CO2 21* 25 21*  BUN 43* 51* 56*  CREATININE 3.65* 4.43* 5.02*  GLUCOSE 139* 115* 102*    Electrolytes  Recent Labs Lab 10/20/15 0448 10/21/15 0505 10/22/15 0506  CALCIUM 6.6* 6.7* 7.8*  MG 1.6* 2.2  --   PHOS 8.5* 5.7* 5.1*    CBC  Recent Labs Lab 10/19/15 2122 10/20/15 0448 10/22/15 0506  WBC 11.2* 8.9 3.2*  HGB 10.8* 10.4* 9.3*  HCT 33.4* 31.6* 28.2*  PLT 231 220 117*    Coag's  Recent Labs Lab 10/21/15 0505  INR 1.42    Sepsis Markers  Recent Labs Lab 10/19/15 1822 10/19/15 2025 10/20/15 1045 10/21/15 0505 10/22/15 0506  LATICACIDVEN 2.16* 2.16* 1.1  --    --   PROCALCITON  --   --  59.28 62.89 36.97    ABG  Recent Labs Lab 10/20/15 1104  PHART 7.305*  PCO2ART 47.5*  PO2ART 99.0    Liver Enzymes  Recent Labs Lab 10/19/15 1814 10/20/15 0448 10/21/15 0505  AST 4887* 9304* 2796*  ALT 2285* 2638* 1697*  ALKPHOS 227* 213* 194*  BILITOT 1.1 0.8 0.7  ALBUMIN 2.9* 2.7* 2.6*    Cardiac Enzymes  Recent Labs Lab 10/21/15 1150  TROPONINI 0.56*    Glucose  Recent Labs Lab 10/21/15 0447 10/21/15 0846 10/21/15 1133 10/21/15 1625 10/21/15 1938 10/22/15 0426  GLUCAP 117* 105* 126* 95 99 87    Imaging US Renal  10/21/2015  CLINICAL DATA:  Renal failure.  MRSA. EXAM: RENAL / URINARY TRACT ULTRASOUND COMPLETE COMPARISON:  None. FINDINGS: Suboptimal exam.  Noncompliant patient. Right Kidney: Length: 11.8 cm. Echogenicity within normal limits. No mass or hydronephrosis visualized. No perinephric fluid seen. Left Kidney: Length: 11.3 cm. Echogenicity within normal limits. No mass or hydronephrosis visualized. No perinephric fluid seen. Bladder: Bladder decompressed by Foley catheter. IMPRESSION: Kidneys appear grossly normal bilaterally.  No hydronephrosis. Electronically Signed   By: Franki Cabot M.D.   On: 10/21/2015 21:05   Dg Chest Port 1 View  10/22/2015  CLINICAL DATA:  Respiratory failure. EXAM: PORTABLE CHEST 1 VIEW COMPARISON:  10/19/2015. FINDINGS: Cardiomegaly. Previous median sternotomy for valve replacement. No pneumothorax. IJ catheter tip SVC. BILATERAL pulmonary opacities are redemonstrated, LEFT greater than RIGHT, and stable. No effusion. IMPRESSION: Stable chest. Electronically Signed   By: Staci Righter M.D.   On: 10/22/2015 07:10   STUDIES:  Renal US >> normal kidneys  CULTURES: 6/11 Blood cx >> coag negative staph  6/11 Urine>>neg  ANTIBIOTICS: Unasyn 6/11 >> Azithromycin 6/11 >> 6/14  SIGNIFICANT EVENTS:  LINES/TUBES: RIJ central line 6/11 >> Foley 6/11  DISCUSSION: [name] is 55F w/ hx  significant for alcoholism, major depressive disorder, deafness, prior ASD repair and MVR who presents with altered mental status and shock of unclear etiology. She had mildly elevated lactate, AKI, acute liver injury and was quite somnolent, although it is difficult to assess a possible encephalopathy with the communication barrier. Her mild troponin leak is likely secondary to her prolonged hypotension, as is her AKI and liver injury. Suspect she may have binged on alcohol or other substance while her daughter was away from home.  6/14 off pressors x 2 days. Making urine although creatine not improving.  ASSESSMENT / PLAN:  PULMONARY A: Possible infiltrate on CXR / concern for aspiration ? Hx sleep apnea P:   Continue abx  Pulmonary toilet Supplemental O2 as needed to keep sats >92% Repeat CXR as needed  CARDIOVASCULAR A:  Shock- hypovolemic vs cardiogenic vs septic (less likely), resolved Troponin leak - likely 2/2 shock state History of diastolic dysfunction Hx prior ASD repair / MVR Echo grade 2 dysfunction P:  Continue gentle volume  Follow CVP Off pressors  Monitor for s/s of CHF  RENAL Lab Results  Component Value Date   CREATININE 5.02* 10/22/2015   CREATININE 4.43* 10/21/2015   CREATININE 3.65* 10/20/2015   A:   Acute kidney injury, oliguric / anuric. Likely hypoperfusion injury / ATN Metabolic acidosis (lactic +/- ketoacidosis) hypoMg  Rhabdomyolysis  P:   Continue gentle volume  Avoid nephrotoxins Strict I/Os Check renal US for completeness 6/13, negative,  Will consult Renal 6/14 if no evidence recovery Making urine > 30 cc /hr after fluid bolus 6/13 Will need to follow closely    GASTROINTESTINAL A:   Acute hepatitis (shock liver vs EtOH vs other) Hyperammonemia  P:   Trend LFTs, reordered 6/14 Trend ammonia down to 52 from 73 Daily thiamine / folate / MVI  HEMATOLOGIC A:   No acute issues P:  SQ heparin  F/u CBC   INFECTIOUS A:    Patchy opacity concerning for aspiration  Coag negative staph 2 of 2 blood cx P:   Continue empiric antibiotics for now, note elevated Pct.  D/c azithro 6/14 UA benign Repeat [facility]  6/14  ENDOCRINE A:   No acute issues P:   Monitor glucose on chem   NEUROLOGIC A:   Somnolent (resolved 6/13)6/14 calmer Deaf P:   RASS goal: 0 Follow clinically Watch closely for ETOH withdrawal - per daughter had been trying to quit but suspect she may have had a binge while daughter gone Thiamine and folic acid   Global: She is improving, HD stable, making urine, consider tx to SDU and to [facility].  FAMILY  - Updates: Mother and daughter at bedside 6/13  - Inter-disciplinary family meet or Palliative Care meeting due by:  day [place] PCCM Pager [phone] till 3 pm If no answer page [phone] 10/22/2015, 8:59 AM  Attending Note:  I have examined patient, reviewed labs, studies and notes. I have discussed the case with S Minor, and I agree with the data and plans as amended above. 55 yo debilitated woman, hx EtOH abuse, deafness, MVR. Found poorly responsive, hypotensive with metabolic disarray and renal failure, rhabdo. She continues to be more alert. UOP has increased over last 24h.  We will plan to continue empiric unasyn, gentle IVF's and follow renal and hepatic recovery. Suspect that S Cr will plateau soon. No urgent indication to consider HD. Will consult Renal if this changes. Plan to SDU and to [facility] as of 6/14.    Baltazar Apo, MD, PhD 10/22/2015, 3:04 PM [facility] [phone] or if no answer [phone]

## 2015-10-23 ENCOUNTER — Inpatient Hospital Stay (HOSPITAL_COMMUNITY): Payer: Medicare Other

## 2015-10-23 DIAGNOSIS — D696 Thrombocytopenia, unspecified: Secondary | ICD-10-CM

## 2015-10-23 DIAGNOSIS — D649 Anemia, unspecified: Secondary | ICD-10-CM

## 2015-10-23 DIAGNOSIS — R778 Other specified abnormalities of plasma proteins: Secondary | ICD-10-CM

## 2015-10-23 DIAGNOSIS — N179 Acute kidney failure, unspecified: Secondary | ICD-10-CM

## 2015-10-23 DIAGNOSIS — J9601 Acute respiratory failure with hypoxia: Secondary | ICD-10-CM

## 2015-10-23 DIAGNOSIS — R7989 Other specified abnormal findings of blood chemistry: Secondary | ICD-10-CM

## 2015-10-23 DIAGNOSIS — R740 Nonspecific elevation of levels of transaminase and lactic acid dehydrogenase [LDH]: Secondary | ICD-10-CM

## 2015-10-23 DIAGNOSIS — J690 Pneumonitis due to inhalation of food and vomit: Secondary | ICD-10-CM

## 2015-10-23 DIAGNOSIS — R7401 Elevation of levels of liver transaminase levels: Secondary | ICD-10-CM

## 2015-10-23 DIAGNOSIS — R7881 Bacteremia: Secondary | ICD-10-CM

## 2015-10-23 HISTORY — DX: Acute respiratory failure with hypoxia: J96.01

## 2015-10-23 LAB — CBC
HCT: 27.3 % — ABNORMAL LOW (ref 36.0–46.0)
Hemoglobin: 9.1 g/dL — ABNORMAL LOW (ref 12.0–15.0)
MCH: 31.7 pg (ref 26.0–34.0)
MCHC: 33.3 g/dL (ref 30.0–36.0)
MCV: 95.1 fL (ref 78.0–100.0)
PLATELETS: 114 10*3/uL — AB (ref 150–400)
RBC: 2.87 MIL/uL — AB (ref 3.87–5.11)
RDW: 14 % (ref 11.5–15.5)
WBC: 3.8 10*3/uL — ABNORMAL LOW (ref 4.0–10.5)

## 2015-10-23 LAB — BASIC METABOLIC PANEL
Anion gap: 11 (ref 5–15)
BUN: 50 mg/dL — AB (ref 6–20)
CALCIUM: 8.1 mg/dL — AB (ref 8.9–10.3)
CO2: 22 mmol/L (ref 22–32)
CREATININE: 4.27 mg/dL — AB (ref 0.44–1.00)
Chloride: 105 mmol/L (ref 101–111)
GFR calc Af Amer: 12 mL/min — ABNORMAL LOW (ref >60)
GFR, EST NON AFRICAN AMERICAN: 11 mL/min — AB (ref >60)
GLUCOSE: 90 mg/dL (ref 65–99)
Potassium: 3.3 mmol/L — ABNORMAL LOW (ref 3.5–5.1)
SODIUM: 138 mmol/L (ref 135–145)

## 2015-10-23 LAB — CULTURE, BLOOD (ROUTINE X 2)

## 2015-10-23 LAB — GLUCOSE, CAPILLARY
GLUCOSE-CAPILLARY: 94 mg/dL (ref 65–99)
Glucose-Capillary: 98 mg/dL (ref 65–99)

## 2015-10-23 LAB — PHOSPHORUS: PHOSPHORUS: 4.2 mg/dL (ref 2.5–4.6)

## 2015-10-23 LAB — TROPONIN I: Troponin I: 0.2 ng/mL — ABNORMAL HIGH (ref <0.031)

## 2015-10-23 LAB — MAGNESIUM: Magnesium: 2 mg/dL (ref 1.7–2.4)

## 2015-10-23 LAB — OCCULT BLOOD X 1 CARD TO LAB, STOOL: Fecal Occult Bld: NEGATIVE

## 2015-10-23 MED ORDER — OXYMETAZOLINE HCL 0.05 % NA SOLN
1.0000 | Freq: Two times a day (BID) | NASAL | Status: AC
  Administered 2015-10-23 – 2015-10-24 (×3): 1 via NASAL
  Filled 2015-10-23: qty 15

## 2015-10-23 MED ORDER — TRAMADOL HCL 50 MG PO TABS
50.0000 mg | ORAL_TABLET | Freq: Two times a day (BID) | ORAL | Status: DC | PRN
  Administered 2015-10-23 – 2015-10-25 (×3): 50 mg via ORAL
  Filled 2015-10-23 (×3): qty 1

## 2015-10-23 MED ORDER — LACTULOSE 10 GM/15ML PO SOLN
10.0000 g | Freq: Every day | ORAL | Status: DC
Start: 2015-10-23 — End: 2015-10-24
  Administered 2015-10-23 – 2015-10-24 (×2): 10 g via ORAL
  Filled 2015-10-23 (×2): qty 15

## 2015-10-23 MED ORDER — POTASSIUM CHLORIDE CRYS ER 20 MEQ PO TBCR
40.0000 meq | EXTENDED_RELEASE_TABLET | Freq: Once | ORAL | Status: AC
  Administered 2015-10-23: 40 meq via ORAL
  Filled 2015-10-23: qty 2

## 2015-10-23 NOTE — Progress Notes (Signed)
Pt c/o 6/10 headache, no prn available, MD notified. New order received for tramadol 50mg . Will continue to monitor patient closely.

## 2015-10-23 NOTE — Progress Notes (Signed)
Patient refuses to get OOB this am, even to chair, also complains of nasal stuffiness. Incentive spirometer given to patient and education given on proper use and frequency. Humidity bottle added to nasal cannula. MD notified. New order received for Afrin nasal spray BID for 3 doses total. MD advises that it is not an option for patient to remain in bed, must ambulate.

## 2015-10-23 NOTE — Progress Notes (Signed)
MD notified that patient peripheral IV access flushes well, asked for order to remove central line.

## 2015-10-23 NOTE — Progress Notes (Signed)
PROGRESS NOTE  [name]  [id number] DOB: Oct 20, 1960 DOA: 10/19/2015 PCP: No primary care provider on file.  Brief Narrative:   [name] is a 66F with previous hx ETOH, mitral valve replacement, deafness who presented to the ED 6/11 with AMS, renal failure and rhabdomyolysis which she attributed to an unintentional overdose of muscle relaxant. Admitted with hypotension, suspected aspiration PNA, AKI.   Assessment & Plan:   Active Problems:   Shock ([place])   Aspiration pneumonia ([place])   Acute respiratory failure with hypoxia ([facility])   Acute kidney injury ([place])   Transaminitis   Positive blood culture   Elevated troponin   Thrombocytopenia ([facility])   Normocytic anemia  Acute hypoxic respiratory failure secondary to aspiration pneumonia -  Transition to room air -  Continue Unasyn through 6/18, then stop  Shock due to accidental overdose on muscle relaxant.  Patient states she does not remember drinking alcohol -  Hold all sedating and addictive medications and defer to patient's pain specialist -  Continue IVF  Acute kidney injury, likely ATN from hypotension and rhabdomyolysis.  Uop increasing and creatinine and CPK starting to trend down -  RUS:  No hydronephrosis -  continue IVF  Acute hepatitis (shock liver vs EtOH vs rhabdo), LFTs trending down -  Tylenol negative -  Check acute hepatitis panel -  Continue thiamine, folate, MVI  Hyperammonemia, likely related to shock and improving -  Use lactulose for constipation if needed  Coag negative staph 2 out of 2 blood cx -  F/u repeat BCx from 6/14  Troponin leak - likely 2/2 shock state  Hx prior ASD repair / MVR  Echo grade 2 dysfunction  Hypokalemia, oral potassium repletion  Normocytic anemia, likely due to AKI and acute illness -  Iron studies, B12, folate -  TSH -  Occult stool -  Repeat hgb in AM  Mild thrombocytopenia due to pneumonia and antibiotics and severe illness -  Trend platelets   DVT  prophylaxis:  heparin Code Status:  Full code Family Communication:  Patient alone Disposition Plan:  Pending improvement in kidney function    Consultants:   PCCM transfer to hospitalist on 6/15  Procedures:  CVC placement.  Removal on 6/15  Antimicrobials:  Unasyn 6/11 >>  Azithromycin 6/11 >> 6/14    Subjective: Feeling better overall.  Denies chest pains, shortness of breath, nausea.  She has a headache, backache and neck pain, acute on chronic.  Overall feels weak.  Objective: Filed Vitals:   10/23/15 0200 10/23/15 0400 10/23/15 0720 10/23/15 1200  BP: 125/84 108/78 142/85 158/84  Pulse: 72 69 73 75  Temp:  98.5 F (36.9 C) 98.4 F (36.9 C) 97.6 F (36.4 C)  TempSrc:  Oral Oral Oral  Resp: 13 21 17 14   Height:      Weight:  72.7 kg (160 lb 4.4 oz)    SpO2: 100% 100% 99% 96%    Intake/Output Summary (Last 24 hours) at 10/23/15 1514 Last data filed at 10/23/15 0720  Gross per 24 hour  Intake    740 ml  Output   1270 ml  Net   -530 ml   Filed Weights   10/21/15 0200 10/22/15 0500 10/23/15 0400  Weight: 69 kg (152 lb 1.9 oz) 72.2 kg (159 lb 2.8 oz) 72.7 kg (160 lb 4.4 oz)    Examination:  General exam:  Adult female.  No acute distress.  HEENT:  NCAT, MMM Respiratory system: Clear to auscultation bilaterally  Cardiovascular system: Regular rate and rhythm, normal S1/S2. No murmurs, rubs, gallops or clicks.  Warm extremities Gastrointestinal system: Normal active bowel sounds, soft, nondistended, nontender. MSK:  Normal tone and bulk, no lower extremity edema Neuro:  Grossly intact.  Able to sit up in bed unassisted but with verbal encouragement.    Data Reviewed: I have personally reviewed following labs and imaging studies  CBC:  Recent Labs Lab 10/19/15 1814 10/19/15 2122 10/20/15 0448 10/22/15 0506 10/23/15 0443  WBC 7.1 11.2* 8.9 3.2* 3.8*  NEUTROABS 6.2  --   --   --   --   HGB 11.4* 10.8* 10.4* 9.3* 9.1*  HCT 34.8* 33.4* 31.6* 28.2*  27.3*  MCV 97.8 97.7 96.9 97.2 95.1  PLT 193 231 220 117* [id number]*   Basic Metabolic Panel:  Recent Labs Lab 10/19/15 1814 10/19/15 2122 10/20/15 0448 10/21/15 0505 10/22/15 0506 10/23/15 0443  NA 136  --  137 137 137 138  K 4.0  --  3.9 3.1* 3.5 3.3*  CL 103  --  105 100* 106 105  CO2 14*  --  21* 25 21* 22  GLUCOSE 92  --  139* 115* 102* 90  BUN 41*  --  43* 51* 56* 50*  CREATININE 3.59* 3.46* 3.65* 4.43* 5.02* 4.27*  CALCIUM 7.2*  --  6.6* 6.7* 7.8* 8.1*  MG  --   --  1.6* 2.2  --  2.0  PHOS  --   --  8.5* 5.7* 5.1* 4.2   GFR: Estimated Creatinine Clearance: 14.9 mL/min (by C-G formula based on Cr of 4.27). Liver Function Tests:  Recent Labs Lab 10/19/15 1814 10/20/15 0448 10/21/15 0505 10/22/15 0959  AST 4887* 9304* 2796* 876*  ALT 2285* 2638* 1697* 1127*  ALKPHOS 227* 213* 194* 176*  BILITOT 1.1 0.8 0.7 0.7  PROT 5.4* 5.2* 4.8* 5.4*  ALBUMIN 2.9* 2.7* 2.6* 2.4*    Recent Labs Lab 10/19/15 1814  LIPASE 46    Recent Labs Lab 10/19/15 2215 10/21/15 1530  AMMONIA 70* 52*   Coagulation Profile:  Recent Labs Lab 10/21/15 0505  INR 1.42   Cardiac Enzymes:  Recent Labs Lab 10/19/15 2122 10/20/15 1045 10/21/15 0505 10/21/15 1150 10/22/15 0959 10/22/15 1855 10/22/15 2343  CKTOTAL 1935* [id number]* 1771*  --   --   --   --   TROPONINI  --   --   --  0.56* 0.32* 0.23* 0.20*   BNP (last 3 results) No results for input(s): PROBNP in the last 8760 hours. HbA1C: No results for input(s): HGBA1C in the last 72 hours. CBG:  Recent Labs Lab 10/21/15 1938 10/21/15 2352 10/22/15 0426 10/22/15 0817 10/22/15 1140  GLUCAP 99 98 87 94 114*   Lipid Profile: No results for input(s): CHOL, HDL, LDLCALC, TRIG, CHOLHDL, LDLDIRECT in the last 72 hours. Thyroid Function Tests: No results for input(s): TSH, T4TOTAL, FREET4, T3FREE, THYROIDAB in the last 72 hours. Anemia Panel: No results for input(s): VITAMINB12, FOLATE, FERRITIN, TIBC, IRON, RETICCTPCT in the  last 72 hours. Urine analysis:    Component Value Date/Time   COLORURINE AMBER* 10/19/2015 2022   APPEARANCEUR CLEAR 10/19/2015 2022   LABSPEC 1.019 10/19/2015 2022   PHURINE 5.5 10/19/2015 2022   GLUCOSEU NEGATIVE 10/19/2015 2022   HGBUR MODERATE* 10/19/2015 2022   BILIRUBINUR MODERATE* 10/19/2015 2022   KETONESUR NEGATIVE 10/19/2015 2022   PROTEINUR 30* 10/19/2015 2022   NITRITE NEGATIVE 10/19/2015 2022   LEUKOCYTESUR NEGATIVE 10/19/2015 2022   Sepsis Labs: @LABRCNTIP (procalcitonin:4,lacticidven:4)  )  Recent Results (from the past 240 hour(s))  Blood culture (routine x 2)     Status: None (Preliminary result)   Collection Time: 10/19/15  6:05 PM  Result Value Ref Range Status   Specimen Description BLOOD RIGHT ARM  Final   Special Requests BOTTLES DRAWN AEROBIC ONLY 5CC  Final   Culture NO GROWTH 4 DAYS  Final   Report Status PENDING  Incomplete  Blood culture (routine x 2)     Status: Abnormal   Collection Time: 10/19/15  6:11 PM  Result Value Ref Range Status   Specimen Description BLOOD RIGHT HAND  Final   Special Requests BOTTLES DRAWN AEROBIC ONLY 5CC  Final   Culture  Setup Time   Final    GRAM POSITIVE COCCI IN CLUSTERS AEROBIC BOTTLE ONLY Organism ID to follow CRITICAL RESULT CALLED TO, READ BACK BY AND VERIFIED WITH: L CURRAN PHARMD 2130 10/20/15 A BROWNING    Culture (A)  Final    STAPHYLOCOCCUS SPECIES (COAGULASE NEGATIVE) THE SIGNIFICANCE OF ISOLATING THIS ORGANISM FROM A SINGLE SET OF BLOOD CULTURES WHEN MULTIPLE SETS ARE DRAWN IS UNCERTAIN. PLEASE NOTIFY THE MICROBIOLOGY DEPARTMENT WITHIN ONE WEEK IF SPECIATION AND SENSITIVITIES ARE REQUIRED.    Report Status 10/23/2015 FINAL  Final  Blood Culture ID Panel (Reflexed)     Status: Abnormal   Collection Time: 10/19/15  6:11 PM  Result Value Ref Range Status   Enterococcus species NOT DETECTED NOT DETECTED Final   Vancomycin resistance NOT DETECTED NOT DETECTED Final   Listeria monocytogenes NOT DETECTED  NOT DETECTED Final   Staphylococcus species DETECTED (A) NOT DETECTED Final    Comment: CRITICAL RESULT CALLED TO, READ BACK BY AND VERIFIED WITH: L CURRAN PHARMD 2130 10/20/15 A BROWNING    Staphylococcus aureus NOT DETECTED NOT DETECTED Final   Methicillin resistance NOT DETECTED NOT DETECTED Final   Streptococcus species NOT DETECTED NOT DETECTED Final   Streptococcus agalactiae NOT DETECTED NOT DETECTED Final   Streptococcus pneumoniae NOT DETECTED NOT DETECTED Final   Streptococcus pyogenes NOT DETECTED NOT DETECTED Final   Acinetobacter baumannii NOT DETECTED NOT DETECTED Final   Enterobacteriaceae species NOT DETECTED NOT DETECTED Final   Enterobacter cloacae complex NOT DETECTED NOT DETECTED Final   Escherichia coli NOT DETECTED NOT DETECTED Final   Klebsiella oxytoca NOT DETECTED NOT DETECTED Final   Klebsiella pneumoniae NOT DETECTED NOT DETECTED Final   Proteus species NOT DETECTED NOT DETECTED Final   Serratia marcescens NOT DETECTED NOT DETECTED Final   Carbapenem resistance NOT DETECTED NOT DETECTED Final   Haemophilus influenzae NOT DETECTED NOT DETECTED Final   Neisseria meningitidis NOT DETECTED NOT DETECTED Final   Pseudomonas aeruginosa NOT DETECTED NOT DETECTED Final   Candida albicans NOT DETECTED NOT DETECTED Final   Candida glabrata NOT DETECTED NOT DETECTED Final   Candida krusei NOT DETECTED NOT DETECTED Final   Candida parapsilosis NOT DETECTED NOT DETECTED Final   Candida tropicalis NOT DETECTED NOT DETECTED Final  MRSA PCR Screening     Status: Abnormal   Collection Time: 10/19/15 11:20 PM  Result Value Ref Range Status   MRSA by PCR POSITIVE (A) NEGATIVE Final    Comment:        The GeneXpert MRSA Assay (FDA approved for NASAL specimens only), is one component of a comprehensive MRSA colonization surveillance program. It is not intended to diagnose MRSA infection nor to guide or monitor treatment for MRSA infections. CRITICAL RESULT CALLED TO,  READ BACK BY AND VERIFIED WITH: CASEY  MILLER AT [id number] 10/20/15.PMH   Urine culture     Status: None   Collection Time: 10/19/15 11:40 PM  Result Value Ref Range Status   Specimen Description URINE, CATHETERIZED  Final   Special Requests NONE  Final   Culture NO GROWTH  Final   Report Status 10/21/2015 FINAL  Final  Culture, blood (Routine X 2) w Reflex to ID Panel     Status: None (Preliminary result)   Collection Time: 10/22/15  9:45 AM  Result Value Ref Range Status   Specimen Description BLOOD LEFT ANTECUBITAL  Final   Special Requests   Final    BOTTLES DRAWN AEROBIC AND ANAEROBIC  4CC AER 6CC ANA   Culture NO GROWTH 1 DAY  Final   Report Status PENDING  Incomplete  Culture, blood (Routine X 2) w Reflex to ID Panel     Status: None (Preliminary result)   Collection Time: 10/22/15 10:31 AM  Result Value Ref Range Status   Specimen Description BLOOD RIGHT HAND  Final   Special Requests BOTTLES DRAWN AEROBIC ONLY  8CC  Final   Culture NO GROWTH 1 DAY  Final   Report Status PENDING  Incomplete      Radiology Studies: US Renal  10/21/2015  CLINICAL DATA:  Renal failure.  MRSA. EXAM: RENAL / URINARY TRACT ULTRASOUND COMPLETE COMPARISON:  None. FINDINGS: Suboptimal exam.  Noncompliant patient. Right Kidney: Length: 11.8 cm. Echogenicity within normal limits. No mass or hydronephrosis visualized. No perinephric fluid seen. Left Kidney: Length: 11.3 cm. Echogenicity within normal limits. No mass or hydronephrosis visualized. No perinephric fluid seen. Bladder: Bladder decompressed by Foley catheter. IMPRESSION: Kidneys appear grossly normal bilaterally.  No hydronephrosis. Electronically Signed   By: Franki Cabot M.D.   On: 10/21/2015 21:05   Dg Chest Port 1 View  10/23/2015  CLINICAL DATA:  Respiratory failure.  Shortness of breath. EXAM: PORTABLE CHEST 1 VIEW COMPARISON:  10/22/2015. FINDINGS: Right IJ line stable position. Prior CABG and cardiac valve replacement. Cardiomegaly again  noted. Left mid lung and bibasilar infiltrates again noted. These changes could be related to pneumonia and/or pulmonary edema. Small bilateral pleural effusions. No pneumothorax. IMPRESSION: 1. Right IJ line stable position. 2. Prior CABG and cardiac valve replacement. Cardiomegaly again noted. Left mid and bibasilar infiltrates again noted and are unchanged. These findings could be related to pneumonia and/or pulmonary edema. Small bilateral pleural effusions. Chest is unchanged from prior exam. Electronically Signed   By: [place]   On: 10/23/2015 06:44   Dg Chest Port 1 View  10/22/2015  CLINICAL DATA:  Respiratory failure. EXAM: PORTABLE CHEST 1 VIEW COMPARISON:  10/19/2015. FINDINGS: Cardiomegaly. Previous median sternotomy for valve replacement. No pneumothorax. IJ catheter tip SVC. BILATERAL pulmonary opacities are redemonstrated, LEFT greater than RIGHT, and stable. No effusion. IMPRESSION: Stable chest. Electronically Signed   By: Staci Righter M.D.   On: 10/22/2015 07:10     Scheduled Meds: . ampicillin-sulbactam (UNASYN) IV  1.5 g Intravenous Q12H  . antiseptic oral rinse  7 mL Mouth Rinse BID  . Chlorhexidine Gluconate Cloth  6 each Topical Q0600  . folic acid  1 mg Oral Daily  . heparin  5,000 Units Subcutaneous Q8H  . lactulose  10 g Oral Daily  . multivitamin with minerals  1 tablet Oral Daily  . mupirocin ointment  1 application Nasal BID  . oxymetazoline  1 spray Each Nare BID  . pantoprazole  40 mg Oral Q1200  . thiamine  100  mg Oral Daily   Continuous Infusions: . sodium chloride 50 mL/hr at 10/22/15 2304     LOS: 4 days    Time spent: 30 min    Janece Canterbury, MD [facility] Pager [phone]  If 7PM-7AM, please contact night-coverage [email or web address] Password [facility] 10/23/2015, 3:14 PM

## 2015-10-24 LAB — COMPREHENSIVE METABOLIC PANEL
ALBUMIN: 2.2 g/dL — AB (ref 3.5–5.0)
ALT: 575 U/L — ABNORMAL HIGH (ref 14–54)
ANION GAP: 8 (ref 5–15)
AST: 207 U/L — ABNORMAL HIGH (ref 15–41)
Alkaline Phosphatase: 145 U/L — ABNORMAL HIGH (ref 38–126)
BILIRUBIN TOTAL: 1.5 mg/dL — AB (ref 0.3–1.2)
BUN: 40 mg/dL — ABNORMAL HIGH (ref 6–20)
CHLORIDE: 109 mmol/L (ref 101–111)
CO2: 23 mmol/L (ref 22–32)
Calcium: 8.2 mg/dL — ABNORMAL LOW (ref 8.9–10.3)
Creatinine, Ser: 3.21 mg/dL — ABNORMAL HIGH (ref 0.44–1.00)
GFR calc Af Amer: 18 mL/min — ABNORMAL LOW (ref >60)
GFR calc non Af Amer: 15 mL/min — ABNORMAL LOW (ref >60)
GLUCOSE: 92 mg/dL (ref 65–99)
POTASSIUM: 4 mmol/L (ref 3.5–5.1)
SODIUM: 140 mmol/L (ref 135–145)
TOTAL PROTEIN: 4.9 g/dL — AB (ref 6.5–8.1)

## 2015-10-24 LAB — CK: Total CK: 194 U/L (ref 38–234)

## 2015-10-24 LAB — FERRITIN: FERRITIN: 731 ng/mL — AB (ref 11–307)

## 2015-10-24 LAB — CULTURE, BLOOD (ROUTINE X 2): Culture: NO GROWTH

## 2015-10-24 LAB — IRON AND TIBC
Iron: 24 ug/dL — ABNORMAL LOW (ref 28–170)
Saturation Ratios: 10 % — ABNORMAL LOW (ref 10.4–31.8)
TIBC: 235 ug/dL — ABNORMAL LOW (ref 250–450)
UIBC: 211 ug/dL

## 2015-10-24 LAB — CBC
HCT: 29.2 % — ABNORMAL LOW (ref 36.0–46.0)
HEMOGLOBIN: 9.5 g/dL — AB (ref 12.0–15.0)
MCH: 30.9 pg (ref 26.0–34.0)
MCHC: 32.5 g/dL (ref 30.0–36.0)
MCV: 95.1 fL (ref 78.0–100.0)
PLATELETS: 134 10*3/uL — AB (ref 150–400)
RBC: 3.07 MIL/uL — ABNORMAL LOW (ref 3.87–5.11)
RDW: 14 % (ref 11.5–15.5)
WBC: 4.1 10*3/uL (ref 4.0–10.5)

## 2015-10-24 LAB — TSH: TSH: 6.205 u[IU]/mL — AB (ref 0.350–4.500)

## 2015-10-24 LAB — FOLATE: FOLATE: 32.7 ng/mL (ref >5.9)

## 2015-10-24 LAB — VITAMIN B12: VITAMIN B 12: 3481 pg/mL — AB (ref 180–914)

## 2015-10-24 MED ORDER — LACTULOSE 10 GM/15ML PO SOLN: 10.0000 g | Freq: Every day | ORAL | Status: DC | PRN

## 2015-10-24 NOTE — Evaluation (Signed)
Physical Therapy Evaluation Patient Details Name: [name] MRN: [id number] DOB: 10/22/1960 Today's Date: 10/24/2015   History of Present Illness  [name] is a 55F with previous hx ETOH, mitral valve replacement, deafness who presented to the ED 6/11 with AMS, renal failure and rhabdomyolysis which she attributed to an unintentional overdose of muscle relaxant. Admitted with shock hypotension, suspected aspiration PNA, AKI.   Clinical Impression  Pt admitted with/for AMS and shock .  Pt currently limited functionally due to the problems listed. ( See problems list.)   Pt will benefit from PT to maximize function and safety in order to get ready for next venue listed below.     Follow Up Recommendations SNF    Equipment Recommendations   (TBA next venue)    Recommendations for Other Services       Precautions / Restrictions Precautions Precautions: Fall Restrictions Weight Bearing Restrictions: No      Mobility  Bed Mobility Overal bed mobility: Needs Assistance Bed Mobility: Supine to Sit;Sit to Supine     Supine to sit: Min guard Sit to supine: Min guard      Transfers Overall transfer level: Needs assistance Equipment used: 1 person hand held assist Transfers: Sit to/from [facility] Sit to Stand: Min assist Stand pivot transfers: Min assist       General transfer comment: Min A to power up into standing and min A for balance   Ambulation/Gait Ambulation/Gait assistance: Min assist Ambulation Distance (Feet): 25 Feet Assistive device: Rolling walker (2 wheeled) Gait Pattern/deviations: Step-through pattern Gait velocity: slower and quick to fatigue   General Gait Details: mildly unsteady due to proximal weaknesses  Stairs            Wheelchair Mobility    Modified Rankin (Stroke Patients Only)       Balance Overall balance assessment: Needs assistance Sitting-balance support: Feet supported Sitting balance-Leahy Scale:  Fair Sitting balance - Comments: able to sit with supervision    Standing balance support: Single extremity supported;Bilateral upper extremity supported Standing balance-Leahy Scale: Poor Standing balance comment: min A for balance and assist for balance                              Pertinent Vitals/Pain Pain Assessment: Faces Pain Score: 4  Faces Pain Scale: Hurts little more Pain Location: groin Pain Descriptors / Indicators: Sore Pain Intervention(s): Limited activity within patient's tolerance;Monitored during session    Home Living Family/patient expects to be discharged to:: Skilled nursing facility Living Arrangements: Children (89 y.o daughter)   Type of Home: Apartment Home Access: Level entry     Home Layout: One level Home Equipment: None Additional Comments: [name] is visiting relatives in [place] for the summer     Prior Function Level of Independence: Independent               Hand Dominance   Dominant Hand: Right    Extremity/Trunk Assessment   Upper Extremity Assessment: Defer to OT evaluation           Lower Extremity Assessment: Generalized weakness         Communication   Communication: Deaf (is able to use hearing aide )  Cognition Arousal/Alertness: Awake/alert Behavior During Therapy: WFL for tasks assessed/performed Overall Cognitive Status: No family/caregiver present to determine baseline cognitive functioning Area of Impairment: Problem solving;Safety/judgement         Safety/Judgement: Decreased awareness of safety  Problem Solving: Difficulty sequencing General Comments: Pt requires min- mod verbal cues for safety     General Comments General comments (skin integrity, edema, etc.): Discussed recommendation for SNF with pt and she is agreeable stating she doesn't have assist at discharge     Exercises        Assessment/Plan    PT Assessment Patient needs continued PT services  PT Diagnosis  Difficulty walking;Generalized weakness   PT Problem List Decreased strength;Decreased activity tolerance;Decreased balance;Decreased mobility;Decreased knowledge of use of DME;Pain  PT Treatment Interventions Gait training;DME instruction;Functional mobility training;Therapeutic activities;Balance training;Patient/family education   PT Goals (Current goals can be found in the Care Plan section) Acute Rehab PT Goals Patient Stated Goal: to get stronger  PT Goal Formulation: With patient Time For Goal Achievement: 10/31/15 Potential to Achieve Goals: Good    Frequency Min 3X/week   Barriers to discharge Decreased caregiver support      Co-evaluation               End of Session   Activity Tolerance: Patient limited by fatigue Patient left: in bed;with call bell/phone within reach Nurse Communication: Mobility status         Time: 1820-1850 PT Time Calculation (min) (ACUTE ONLY): 30 min   Charges:   PT Evaluation $PT Eval Moderate Complexity: 1 Procedure PT Treatments $Gait Training: 8-22 mins   PT G Codes:        [place], Tessie Fass 10/24/2015, 6:53 PM  10/24/2015  Donnella Sham, PT [phone]  (pager)

## 2015-10-24 NOTE — Progress Notes (Signed)
PROGRESS NOTE  [name]  [id number] DOB: April 14, 1961 DOA: 10/19/2015 PCP: No primary care provider on file.  Brief Narrative:   [name] is a 89F with previous hx ETOH, mitral valve replacement, deafness who presented to the ED 6/11 with AMS, renal failure and rhabdomyolysis which she attributed to an unintentional overdose of muscle relaxant. Admitted with hypotension, suspected aspiration PNA, AKI.   Assessment & Plan:   Active Problems:   Shock ([place])   Aspiration pneumonia ([place])   Acute respiratory failure with hypoxia ([facility])   Acute kidney injury ([place])   Transaminitis   Positive blood culture   Elevated troponin   Thrombocytopenia ([facility])   Normocytic anemia  Acute hypoxic respiratory failure secondary to aspiration pneumonia -  Transition to room air -  Continue Unasyn through 6/18, then stop  Shock due to accidental overdose on muscle relaxant.  Patient states she does not remember drinking alcohol -  Hold all sedating and addictive medications and defer to patient's pain specialist -  Continue IVF  Acute kidney injury, likely ATN from hypotension and rhabdomyolysis.  uop now brisk -  Creatinine continuing to trend down -  RUS:  No hydronephrosis -  Eating and drinking a little more  -  D/c IVF  Acute hepatitis (shock liver vs EtOH vs rhabdo), LFTs trending down -  Tylenol negative -  Acute hepatitis panel pending -  Continue thiamine, folate, MVI  Hyperammonemia, likely related to shock and improving -  Use lactulose for constipation if needed  Coag negative staph 2 out of 2 blood cx -  Repeat BCx from 6/14:  NGTD  Troponin leak - likely 2/2 shock state  Hx prior ASD repair / MVR  Echo grade 2 dysfunction  Hypokalemia, oral potassium repletion  Normocytic anemia, likely due to AKI and acute illness.  Consistent with inflammation and possibly some underlying iron deficiency. TSH mildly elevated, likely from sick euthyroid.  Vitamin B12 level WNL.     -  Repeat iron studies and TSH in 3-4 weeks  Mild thrombocytopenia due to pneumonia and antibiotics and severe illness, recovering  DVT prophylaxis:  heparin Code Status:  Full code Family Communication:  Patient alone Disposition Plan:  To SNF, likely over weekend..   Consultants:   PCCM transfer to hospitalist on 6/15  Procedures:  CVC placement.  Removal on 6/15  Antimicrobials:  Unasyn 6/11 >>  Azithromycin 6/11 >> 6/14    Subjective: Feeling better overall.  Denies chest pains, shortness of breath, nausea.  She has a headache, backache and neck pain, acute on chronic.  Overall feels weak.  Objective: Filed Vitals:   10/24/15 0800 10/24/15 1100 10/24/15 1116 10/24/15 1221  BP: 158/87  158/88 171/88  Pulse: 79  79 84  Temp: 99.1 F (37.3 C)  98.5 F (36.9 C) 98 F (36.7 C)  TempSrc: Oral   Oral  Resp: 21  18 17   Height:  5\' 4"  (1.626 m)    Weight:  72.9 kg (160 lb 11.5 oz)    SpO2: 97%  97% 99%    Intake/Output Summary (Last 24 hours) at 10/24/15 1849 Last data filed at 10/24/15 0600  Gross per 24 hour  Intake    600 ml  Output    775 ml  Net   -175 ml   Filed Weights   10/23/15 0400 10/24/15 0414 10/24/15 1100  Weight: 72.7 kg (160 lb 4.4 oz) 77.1 kg (169 lb 15.6 oz) 72.9 kg (160 lb 11.5 oz)  Examination:  General exam:  Adult female.  No acute distress.  More alert today compared to previous days. HEENT:  NCAT, MMM Respiratory system: Clear to auscultation bilaterally Cardiovascular system: Regular rate and rhythm, normal S1/S2. No murmurs, rubs, gallops or clicks.  Warm extremities Gastrointestinal system: Normal active bowel sounds, soft, nondistended, nontender. MSK:  Normal tone and bulk, no lower extremity edema Neuro:  Grossly intact.     Data Reviewed: I have personally reviewed following labs and imaging studies  CBC:  Recent Labs Lab 10/19/15 1814 10/19/15 2122 10/20/15 0448 10/22/15 0506 10/23/15 0443 10/24/15 0757  WBC  7.1 11.2* 8.9 3.2* 3.8* 4.1  NEUTROABS 6.2  --   --   --   --   --   HGB 11.4* 10.8* 10.4* 9.3* 9.1* 9.5*  HCT 34.8* 33.4* 31.6* 28.2* 27.3* 29.2*  MCV 97.8 97.7 96.9 97.2 95.1 95.1  PLT 193 231 220 117* 114* [id number]*   Basic Metabolic Panel:  Recent Labs Lab 10/20/15 0448 10/21/15 0505 10/22/15 0506 10/23/15 0443 10/24/15 0757  NA 137 137 137 138 140  K 3.9 3.1* 3.5 3.3* 4.0  CL 105 100* 106 105 109  CO2 21* 25 21* 22 23  GLUCOSE 139* 115* 102* 90 92  BUN 43* 51* 56* 50* 40*  CREATININE 3.65* 4.43* 5.02* 4.27* 3.21*  CALCIUM 6.6* 6.7* 7.8* 8.1* 8.2*  MG 1.6* 2.2  --  2.0  --   PHOS 8.5* 5.7* 5.1* 4.2  --    GFR: Estimated Creatinine Clearance: 19.4 mL/min (by C-G formula based on Cr of 3.21). Liver Function Tests:  Recent Labs Lab 10/19/15 1814 10/20/15 0448 10/21/15 0505 10/22/15 0959 10/24/15 0757  AST 4887* 9304* 2796* 876* 207*  ALT 2285* 2638* 1697* 1127* 575*  ALKPHOS 227* 213* 194* 176* 145*  BILITOT 1.1 0.8 0.7 0.7 1.5*  PROT 5.4* 5.2* 4.8* 5.4* 4.9*  ALBUMIN 2.9* 2.7* 2.6* 2.4* 2.2*    Recent Labs Lab 10/19/15 1814  LIPASE 46    Recent Labs Lab 10/19/15 2215 10/21/15 1530  AMMONIA 70* 52*   Coagulation Profile:  Recent Labs Lab 10/21/15 0505  INR 1.42   Cardiac Enzymes:  Recent Labs Lab 10/19/15 2122 10/20/15 1045 10/21/15 0505 10/21/15 1150 10/22/15 0959 10/22/15 1855 10/22/15 2343 10/24/15 0757  CKTOTAL 1935* [id number]* 1771*  --   --   --   --  194  TROPONINI  --   --   --  0.56* 0.32* 0.23* 0.20*  --    BNP (last 3 results) No results for input(s): PROBNP in the last 8760 hours. HbA1C: No results for input(s): HGBA1C in the last 72 hours. CBG:  Recent Labs Lab 10/21/15 1938 10/21/15 2352 10/22/15 0426 10/22/15 0817 10/22/15 1140  GLUCAP 99 98 87 94 114*   Lipid Profile: No results for input(s): CHOL, HDL, LDLCALC, TRIG, CHOLHDL, LDLDIRECT in the last 72 hours. Thyroid Function Tests:  Recent Labs  10/24/15 0757    TSH 6.205*   Anemia Panel:  Recent Labs  10/24/15 0757  VITAMINB12 3481*  FOLATE 32.7  FERRITIN 731*  TIBC 235*  IRON 24*   Urine analysis:    Component Value Date/Time   COLORURINE AMBER* 10/19/2015 2022   APPEARANCEUR CLEAR 10/19/2015 2022   LABSPEC 1.019 10/19/2015 2022   PHURINE 5.5 10/19/2015 2022   GLUCOSEU NEGATIVE 10/19/2015 2022   HGBUR MODERATE* 10/19/2015 2022   BILIRUBINUR MODERATE* 10/19/2015 2022   KETONESUR NEGATIVE 10/19/2015 2022   PROTEINUR 30* 10/19/2015 2022  NITRITE NEGATIVE 10/19/2015 2022   LEUKOCYTESUR NEGATIVE 10/19/2015 2022   Sepsis Labs: @LABRCNTIP (procalcitonin:4,lacticidven:4)  ) Recent Results (from the past 240 hour(s))  Blood culture (routine x 2)     Status: None   Collection Time: 10/19/15  6:05 PM  Result Value Ref Range Status   Specimen Description BLOOD RIGHT ARM  Final   Special Requests BOTTLES DRAWN AEROBIC ONLY 5CC  Final   Culture NO GROWTH 5 DAYS  Final   Report Status 10/24/2015 FINAL  Final  Blood culture (routine x 2)     Status: Abnormal   Collection Time: 10/19/15  6:11 PM  Result Value Ref Range Status   Specimen Description BLOOD RIGHT HAND  Final   Special Requests BOTTLES DRAWN AEROBIC ONLY 5CC  Final   Culture  Setup Time   Final    GRAM POSITIVE COCCI IN CLUSTERS AEROBIC BOTTLE ONLY Organism ID to follow CRITICAL RESULT CALLED TO, READ BACK BY AND VERIFIED WITH: L CURRAN PHARMD 2130 10/20/15 A BROWNING    Culture (A)  Final    STAPHYLOCOCCUS SPECIES (COAGULASE NEGATIVE) THE SIGNIFICANCE OF ISOLATING THIS ORGANISM FROM A SINGLE SET OF BLOOD CULTURES WHEN MULTIPLE SETS ARE DRAWN IS UNCERTAIN. PLEASE NOTIFY THE MICROBIOLOGY DEPARTMENT WITHIN ONE WEEK IF SPECIATION AND SENSITIVITIES ARE REQUIRED.    Report Status 10/23/2015 FINAL  Final  Blood Culture ID Panel (Reflexed)     Status: Abnormal   Collection Time: 10/19/15  6:11 PM  Result Value Ref Range Status   Enterococcus species NOT DETECTED NOT  DETECTED Final   Vancomycin resistance NOT DETECTED NOT DETECTED Final   Listeria monocytogenes NOT DETECTED NOT DETECTED Final   Staphylococcus species DETECTED (A) NOT DETECTED Final    Comment: CRITICAL RESULT CALLED TO, READ BACK BY AND VERIFIED WITH: L CURRAN PHARMD 2130 10/20/15 A BROWNING    Staphylococcus aureus NOT DETECTED NOT DETECTED Final   Methicillin resistance NOT DETECTED NOT DETECTED Final   Streptococcus species NOT DETECTED NOT DETECTED Final   Streptococcus agalactiae NOT DETECTED NOT DETECTED Final   Streptococcus pneumoniae NOT DETECTED NOT DETECTED Final   Streptococcus pyogenes NOT DETECTED NOT DETECTED Final   Acinetobacter baumannii NOT DETECTED NOT DETECTED Final   Enterobacteriaceae species NOT DETECTED NOT DETECTED Final   Enterobacter cloacae complex NOT DETECTED NOT DETECTED Final   Escherichia coli NOT DETECTED NOT DETECTED Final   Klebsiella oxytoca NOT DETECTED NOT DETECTED Final   Klebsiella pneumoniae NOT DETECTED NOT DETECTED Final   Proteus species NOT DETECTED NOT DETECTED Final   Serratia marcescens NOT DETECTED NOT DETECTED Final   Carbapenem resistance NOT DETECTED NOT DETECTED Final   Haemophilus influenzae NOT DETECTED NOT DETECTED Final   Neisseria meningitidis NOT DETECTED NOT DETECTED Final   Pseudomonas aeruginosa NOT DETECTED NOT DETECTED Final   Candida albicans NOT DETECTED NOT DETECTED Final   Candida glabrata NOT DETECTED NOT DETECTED Final   Candida krusei NOT DETECTED NOT DETECTED Final   Candida parapsilosis NOT DETECTED NOT DETECTED Final   Candida tropicalis NOT DETECTED NOT DETECTED Final  MRSA PCR Screening     Status: Abnormal   Collection Time: 10/19/15 11:20 PM  Result Value Ref Range Status   MRSA by PCR POSITIVE (A) NEGATIVE Final    Comment:        The GeneXpert MRSA Assay (FDA approved for NASAL specimens only), is one component of a comprehensive MRSA colonization surveillance program. It is not intended to  diagnose MRSA infection nor to guide or  monitor treatment for MRSA infections. CRITICAL RESULT CALLED TO, READ BACK BY AND VERIFIED WITH: CASEY MILLER AT [id number] 10/20/15.PMH   Urine culture     Status: None   Collection Time: 10/19/15 11:40 PM  Result Value Ref Range Status   Specimen Description URINE, CATHETERIZED  Final   Special Requests NONE  Final   Culture NO GROWTH  Final   Report Status 10/21/2015 FINAL  Final  Culture, blood (Routine X 2) w Reflex to ID Panel     Status: None (Preliminary result)   Collection Time: 10/22/15  9:45 AM  Result Value Ref Range Status   Specimen Description BLOOD LEFT ANTECUBITAL  Final   Special Requests   Final    BOTTLES DRAWN AEROBIC AND ANAEROBIC  4CC AER 6CC ANA   Culture NO GROWTH 2 DAYS  Final   Report Status PENDING  Incomplete  Culture, blood (Routine X 2) w Reflex to ID Panel     Status: None (Preliminary result)   Collection Time: 10/22/15 10:31 AM  Result Value Ref Range Status   Specimen Description BLOOD RIGHT HAND  Final   Special Requests BOTTLES DRAWN AEROBIC ONLY  8CC  Final   Culture NO GROWTH 2 DAYS  Final   Report Status PENDING  Incomplete      Radiology Studies: Dg Chest Port 1 View  10/23/2015  CLINICAL DATA:  Respiratory failure.  Shortness of breath. EXAM: PORTABLE CHEST 1 VIEW COMPARISON:  10/22/2015. FINDINGS: Right IJ line stable position. Prior CABG and cardiac valve replacement. Cardiomegaly again noted. Left mid lung and bibasilar infiltrates again noted. These changes could be related to pneumonia and/or pulmonary edema. Small bilateral pleural effusions. No pneumothorax. IMPRESSION: 1. Right IJ line stable position. 2. Prior CABG and cardiac valve replacement. Cardiomegaly again noted. Left mid and bibasilar infiltrates again noted and are unchanged. These findings could be related to pneumonia and/or pulmonary edema. Small bilateral pleural effusions. Chest is unchanged from prior exam. Electronically Signed    By: Marcello Moores  Register   On: 10/23/2015 06:44     Scheduled Meds: . ampicillin-sulbactam (UNASYN) IV  1.5 g Intravenous Q12H  . folic acid  1 mg Oral Daily  . heparin  5,000 Units Subcutaneous Q8H  . lactulose  10 g Oral Daily  . multivitamin with minerals  1 tablet Oral Daily  . mupirocin ointment  1 application Nasal BID  . pantoprazole  40 mg Oral Q1200  . thiamine  100 mg Oral Daily   Continuous Infusions: . sodium chloride 50 mL/hr at 10/24/15 1459     LOS: 5 days    Time spent: 30 min    Janece Canterbury, MD [facility] Pager [phone]  If 7PM-7AM, please contact night-coverage [email or web address] Password [facility] 10/24/2015, 6:49 PM

## 2015-10-24 NOTE — Telephone Encounter (Signed)
All we can do is sent her a letter. Please check with [name] because she had left the office when she was here for her annual exam and stated she was not willing to pay for any of her services I am not too clear but check with her.

## 2015-10-24 NOTE — Telephone Encounter (Signed)
SEVERAL PHONE CALLS TO PT . Her mobile and home number are not accepting calls. Unable to leave message. I will ask Dr Toney Rakes about the Reclast infusion.

## 2015-10-24 NOTE — Evaluation (Signed)
Occupational Therapy Evaluation Patient Details Name: [name] MRN: [id number] DOB: 1960-11-03 Today's Date: 10/24/2015    History of Present Illness [name] is a 55F with previous hx ETOH, mitral valve replacement, deafness who presented to the ED 6/11 with AMS, renal failure and rhabdomyolysis which she attributed to an unintentional overdose of muscle relaxant. Admitted with shock hypotension, suspected aspiration PNA, AKI.    Clinical Impression   Pt admitted with above. She demonstrates the below listed deficits and will benefit from continued OT to maximize safety and independence with BADLs.  Pt presents to OT with generalized weakness, decreased activity tolerance, impaired balance, and decreased safety awareness.  She requires min - mod A for ADLs.  She lives with her 60 y.o daughter, how is visiting relatives for the summer.   Recommend SNF level rehab at discharge, and pt is agreeable.       Follow Up Recommendations  SNF    Equipment Recommendations  None recommended by OT    Recommendations for Other Services       Precautions / Restrictions Precautions Precautions: Fall Restrictions Weight Bearing Restrictions: No      Mobility Bed Mobility Overal bed mobility: Needs Assistance Bed Mobility: Supine to Sit;Sit to Supine     Supine to sit: Min guard Sit to supine: Min guard      Transfers Overall transfer level: Needs assistance Equipment used: 1 person hand held assist Transfers: Sit to/from [facility] Sit to Stand: Min assist Stand pivot transfers: Min assist       General transfer comment: Min A to power up into standing and min A for balance     Balance Overall balance assessment: Needs assistance Sitting-balance support: Feet supported Sitting balance-Leahy Scale: Fair Sitting balance - Comments: able to sit with supervision    Standing balance support: Single extremity supported Standing balance-Leahy Scale:  Poor Standing balance comment: min A for balance and assist for balance                             ADL Overall ADL's : Needs assistance/impaired Eating/Feeding: Independent   Grooming: Wash/dry hands;Wash/dry face;Oral care;Brushing hair;Minimal assistance;Standing   Upper Body Bathing: Minimal assitance;Sitting   Lower Body Bathing: Moderate assistance;Sit to/from stand   Upper Body Dressing : Minimal assistance;Sitting   Lower Body Dressing: Moderate assistance;Sit to/from stand   Toilet Transfer: Minimal assistance;Ambulation;Regular Toilet;Grab bars Toilet Transfer Details (indicate cue type and reason): requires assist for balance and to move sit to stand  Toileting- Clothing Manipulation and Hygiene: Minimal assistance;Sit to/from stand       Functional mobility during ADLs: Minimal assistance General ADL Comments: Pt requires min A for balance,  and to move into standing.  She is impulsive requiring cues for safety      Vision     Perception     Praxis      Pertinent Vitals/Pain Pain Assessment: Faces Faces Pain Scale: Hurts little more Pain Location: peri area      Hand Dominance Right   Extremity/Trunk Assessment Upper Extremity Assessment Upper Extremity Assessment: Generalized weakness   Lower Extremity Assessment Lower Extremity Assessment: Defer to PT evaluation       Communication Communication Communication: Deaf (is able to use hearing aide )   Cognition Arousal/Alertness: Awake/alert Behavior During Therapy: Impulsive Overall Cognitive Status: No family/caregiver present to determine baseline cognitive functioning Area of Impairment: Problem solving;Safety/judgement  Safety/Judgement: Decreased awareness of safety   Problem Solving: Difficulty sequencing General Comments: Pt requires min- mod verbal cues for safety    General Comments       Exercises       Shoulder Instructions      Home Living  Family/patient expects to be discharged to:: Skilled nursing facility Living Arrangements: Children (55 y.o daughter)   Type of Home: Apartment Home Access: Level entry     Home Layout: One level     Bathroom Shower/Tub: Teacher, early years/pre: Standard     Home Equipment: None   Additional Comments: dauther is visiting relatives in [place] for the summer       Prior Functioning/Environment Level of Independence: Independent             OT Diagnosis: Generalized weakness;Cognitive deficits;Acute pain   OT Problem List: Decreased strength;Decreased activity tolerance;Impaired balance (sitting and/or standing);Decreased cognition;Decreased safety awareness;Decreased knowledge of use of DME or AE;Pain   OT Treatment/Interventions: Self-care/ADL training;Therapeutic exercise;Energy conservation;DME and/or AE instruction;Therapeutic activities;Cognitive remediation/compensation;Patient/family education;Balance training    OT Goals(Current goals can be found in the care plan section) Acute Rehab OT Goals Patient Stated Goal: to get stronger  OT Goal Formulation: With patient Time For Goal Achievement: 11/07/15 Potential to Achieve Goals: Good ADL Goals Pt Will Perform Grooming: with min guard assist;standing Pt Will Perform Upper Body Bathing: with supervision;sitting Pt Will Perform Lower Body Bathing: with min guard assist;sit to/from stand Pt Will Perform Upper Body Dressing: with supervision;sitting Pt Will Perform Lower Body Dressing: with min guard assist;sit to/from stand Pt Will Transfer to Toilet: with min guard assist;ambulating;regular height toilet;bedside commode;grab bars Pt Will Perform Toileting - Clothing Manipulation and hygiene: with min guard assist;sit to/from stand Pt/caregiver will Perform Home Exercise Program: Increased strength;Left upper extremity;With Supervision;With theraband;Right Upper extremity;With written HEP provided  OT Frequency:  Min 2X/week   Barriers to D/C: Decreased caregiver support          Co-evaluation              End of Session Equipment Utilized During Treatment: Gait belt Nurse Communication: Mobility status  Activity Tolerance: Patient limited by fatigue Patient left: in bed;with call bell/phone within reach;with bed alarm set   Time: [id number] OT Time Calculation (min): 17 min Charges:  OT General Charges $OT Visit: 1 Procedure OT Evaluation $OT Eval Moderate Complexity: 1 Procedure G-Codes:    Lucille Passy M November 05, 2015, 5:52 PM

## 2015-10-24 NOTE — Progress Notes (Signed)
Patient transferring to 5W. Report called to receiving nurse. All questions answered. Patient transferred to 5W.

## 2015-10-25 LAB — CK: Total CK: 133 U/L (ref 38–234)

## 2015-10-25 LAB — CBC
HEMATOCRIT: 27.5 % — AB (ref 36.0–46.0)
HEMOGLOBIN: 9.1 g/dL — AB (ref 12.0–15.0)
MCH: 31.4 pg (ref 26.0–34.0)
MCHC: 33.1 g/dL (ref 30.0–36.0)
MCV: 94.8 fL (ref 78.0–100.0)
Platelets: 129 10*3/uL — ABNORMAL LOW (ref 150–400)
RBC: 2.9 MIL/uL — AB (ref 3.87–5.11)
RDW: 13.9 % (ref 11.5–15.5)
WBC: 4.7 10*3/uL (ref 4.0–10.5)

## 2015-10-25 LAB — HEPATITIS PANEL, ACUTE
HEP A IGM: NEGATIVE
Hep B C IgM: NEGATIVE
Hepatitis B Surface Ag: NEGATIVE

## 2015-10-25 LAB — COMPREHENSIVE METABOLIC PANEL
ALBUMIN: 2.2 g/dL — AB (ref 3.5–5.0)
ALT: 417 U/L — ABNORMAL HIGH (ref 14–54)
ANION GAP: 8 (ref 5–15)
AST: 114 U/L — ABNORMAL HIGH (ref 15–41)
Alkaline Phosphatase: 129 U/L — ABNORMAL HIGH (ref 38–126)
BILIRUBIN TOTAL: 0.5 mg/dL (ref 0.3–1.2)
BUN: 27 mg/dL — ABNORMAL HIGH (ref 6–20)
CO2: 24 mmol/L (ref 22–32)
Calcium: 8.1 mg/dL — ABNORMAL LOW (ref 8.9–10.3)
Chloride: 106 mmol/L (ref 101–111)
Creatinine, Ser: 2.28 mg/dL — ABNORMAL HIGH (ref 0.44–1.00)
GFR calc Af Amer: 27 mL/min — ABNORMAL LOW (ref >60)
GFR calc non Af Amer: 23 mL/min — ABNORMAL LOW (ref >60)
GLUCOSE: 122 mg/dL — AB (ref 65–99)
POTASSIUM: 3 mmol/L — AB (ref 3.5–5.1)
SODIUM: 138 mmol/L (ref 135–145)
TOTAL PROTEIN: 5 g/dL — AB (ref 6.5–8.1)

## 2015-10-25 MED ORDER — POTASSIUM CHLORIDE CRYS ER 20 MEQ PO TBCR
40.0000 meq | EXTENDED_RELEASE_TABLET | Freq: Every day | ORAL | Status: DC
  Administered 2015-10-26 – 2015-10-27 (×2): 40 meq via ORAL
  Filled 2015-10-25 (×2): qty 2

## 2015-10-25 MED ORDER — ASPIRIN 81 MG PO CHEW
81.0000 mg | CHEWABLE_TABLET | Freq: Every day | ORAL | Status: DC
  Administered 2015-10-25 – 2015-10-27 (×3): 81 mg via ORAL
  Filled 2015-10-25 (×3): qty 1

## 2015-10-25 MED ORDER — POTASSIUM CHLORIDE CRYS ER 20 MEQ PO TBCR
40.0000 meq | EXTENDED_RELEASE_TABLET | ORAL | Status: AC
  Administered 2015-10-25 (×2): 40 meq via ORAL
  Filled 2015-10-25 (×2): qty 2

## 2015-10-25 MED ORDER — GABAPENTIN 600 MG PO TABS
600.0000 mg | ORAL_TABLET | Freq: Every day | ORAL | Status: DC
  Administered 2015-10-25 – 2015-10-27 (×3): 600 mg via ORAL
  Filled 2015-10-25 (×3): qty 1

## 2015-10-25 MED ORDER — CARVEDILOL 25 MG PO TABS
25.0000 mg | ORAL_TABLET | Freq: Two times a day (BID) | ORAL | Status: DC
  Administered 2015-10-25 – 2015-10-27 (×4): 25 mg via ORAL
  Filled 2015-10-25 (×5): qty 1

## 2015-10-25 MED ORDER — VILAZODONE HCL 40 MG PO TABS
40.0000 mg | ORAL_TABLET | Freq: Every day | ORAL | Status: DC
  Administered 2015-10-25 – 2015-10-27 (×3): 40 mg via ORAL
  Filled 2015-10-25 (×3): qty 1

## 2015-10-25 NOTE — Progress Notes (Signed)
PROGRESS NOTE  [name]  [id number] DOB: 1960/11/20 DOA: 10/19/2015 PCP: No primary care provider on file.  Brief Narrative:   Ms. Roselle is a 18F with previous hx ETOH, mitral valve replacement, deafness who presented to the ED 6/11 with AMS, renal failure and rhabdomyolysis which she attributed to an unintentional overdose of muscle relaxant. Admitted with hypotension, suspected aspiration PNA, AKI.   Assessment & Plan:   Active Problems:   Shock ([place])   Aspiration pneumonia ([place])   Acute respiratory failure with hypoxia ([facility])   Acute kidney injury ([place])   Transaminitis   Positive blood culture   Elevated troponin   Thrombocytopenia ([facility])   Normocytic anemia   Shock due to accidental overdose on muscle relaxant.  Patient states she does not remember drinking alcohol -  Hold all sedating and addictive medications and defer to patient's pain specialist  Acute hypoxic respiratory failure secondary to aspiration pneumonia -  Transition to room air -  Continue Unasyn through 6/18, then stop  Acute kidney injury, likely ATN from hypotension and rhabdomyolysis.  uop now brisk -  Creatinine continuing to trend down -  RUS:  No hydronephrosis  Acute hepatitis (shock liver vs EtOH vs rhabdo), LFTs trending down -  Tylenol negative -  Acute hepatitis panel negative -  Continue thiamine, folate, MVI  Hyperammonemia, likely related to shock and improving -  Use lactulose for constipation if needed  Coag negative staph 2 out of 2 blood cx -  Repeat BCx from 6/14:  NGTD  Troponin leak - likely 2/2 shock state  Hx prior ASD repair / MVR  Echo grade 2 dysfunction  Hypokalemia, oral potassium repletion   Normocytic anemia, likely due to AKI and acute illness.  Consistent with inflammation and possibly some underlying iron deficiency. TSH mildly elevated, likely from sick euthyroid.  Vitamin B12 level WNL.   -  Repeat iron studies and TSH in 3-4 weeks  Mild  thrombocytopenia due to pneumonia and antibiotics and severe illness, recovering  DVT prophylaxis:  heparin Code Status:  Full code Family Communication:  Patient alone Disposition Plan:  To SNF when bed available.   Consultants:   PCCM transfer to hospitalist on 6/15  Procedures:  CVC placement.  Removal on 6/15  Antimicrobials:  Unasyn 6/11 >>  Azithromycin 6/11 >> 6/14    Subjective: Feeling better overall.  Feels sad and wishes her situation in life were different.  Would like some of her home medications resumed.  Denies that this was a suicide attempt.  States she thinks she overdosed on muscle relaxant in attempt to get high.  Hx of drug abuse.  Does not remember drinking alcohol.    Objective: Filed Vitals:   10/24/15 1221 10/24/15 2135 10/25/15 0609 10/25/15 1511  BP: 171/88 152/96 160/77 149/86  Pulse: 84 84 79 78  Temp: 98 F (36.7 C) 98.2 F (36.8 C) 98.2 F (36.8 C) 98 F (36.7 C)  TempSrc: Oral Oral    Resp: 17 18 18 18   Height:      Weight:   72.94 kg (160 lb 12.9 oz)   SpO2: 99% 97% 100% 100%    Intake/Output Summary (Last 24 hours) at 10/25/15 1628 Last data filed at 10/25/15 1513  Gross per 24 hour  Intake    770 ml  Output   1600 ml  Net   -830 ml   Filed Weights   10/24/15 0414 10/24/15 1100 10/25/15 0609  Weight: 77.1 kg (169 lb  15.6 oz) 72.9 kg (160 lb 11.5 oz) 72.94 kg (160 lb 12.9 oz)    Examination:  General exam:  Adult female.  No acute distress.  HEENT:  NCAT, MMM Respiratory system: Clear to auscultation bilaterally Cardiovascular system: Regular rate and rhythm, normal S1/S2. No murmurs, rubs, gallops or clicks.  Warm extremities Gastrointestinal system: Normal active bowel sounds, soft, nondistended, nontender. MSK:  Normal tone and bulk,1+ nonpitting lower extremity edema Neuro:  Grossly intact.     Data Reviewed: I have personally reviewed following labs and imaging studies  CBC:  Recent Labs Lab 10/19/15 1814   10/20/15 0448 10/22/15 0506 10/23/15 0443 10/24/15 0757 10/25/15 0608  WBC 7.1  < > 8.9 3.2* 3.8* 4.1 4.7  NEUTROABS 6.2  --   --   --   --   --   --   HGB 11.4*  < > 10.4* 9.3* 9.1* 9.5* 9.1*  HCT 34.8*  < > 31.6* 28.2* 27.3* 29.2* 27.5*  MCV 97.8  < > 96.9 97.2 95.1 95.1 94.8  PLT 193  < > 220 117* 114* 134* 129*  < > = values in this interval not displayed. Basic Metabolic Panel:  Recent Labs Lab 10/20/15 0448 10/21/15 0505 10/22/15 0506 10/23/15 0443 10/24/15 0757 10/25/15 0608  NA 137 137 137 138 140 138  K 3.9 3.1* 3.5 3.3* 4.0 3.0*  CL 105 100* 106 105 109 106  CO2 21* 25 21* 22 23 24   GLUCOSE 139* 115* 102* 90 92 122*  BUN 43* 51* 56* 50* 40* 27*  CREATININE 3.65* 4.43* 5.02* 4.27* 3.21* 2.28*  CALCIUM 6.6* 6.7* 7.8* 8.1* 8.2* 8.1*  MG 1.6* 2.2  --  2.0  --   --   PHOS 8.5* 5.7* 5.1* 4.2  --   --    GFR: Estimated Creatinine Clearance: 27.3 mL/min (by C-G formula based on Cr of 2.28). Liver Function Tests:  Recent Labs Lab 10/20/15 0448 10/21/15 0505 10/22/15 0959 10/24/15 0757 10/25/15 0608  AST 9304* 2796* 876* 207* 114*  ALT 2638* 1697* 1127* 575* 417*  ALKPHOS 213* 194* 176* 145* 129*  BILITOT 0.8 0.7 0.7 1.5* 0.5  PROT 5.2* 4.8* 5.4* 4.9* 5.0*  ALBUMIN 2.7* 2.6* 2.4* 2.2* 2.2*    Recent Labs Lab 10/19/15 1814  LIPASE 46    Recent Labs Lab 10/19/15 2215 10/21/15 1530  AMMONIA 70* 52*   Coagulation Profile:  Recent Labs Lab 10/21/15 0505  INR 1.42   Cardiac Enzymes:  Recent Labs Lab 10/19/15 2122 10/20/15 1045 10/21/15 0505 10/21/15 1150 10/22/15 0959 10/22/15 1855 10/22/15 2343 10/24/15 0757 10/25/15 0608  CKTOTAL 1935* 2547* 1771*  --   --   --   --  194 133  TROPONINI  --   --   --  0.56* 0.32* 0.23* 0.20*  --   --    BNP (last 3 results) No results for input(s): PROBNP in the last 8760 hours. HbA1C: No results for input(s): HGBA1C in the last 72 hours. CBG:  Recent Labs Lab 10/21/15 1938 10/21/15 2352  10/22/15 0426 10/22/15 0817 10/22/15 1140  GLUCAP 99 98 87 94 114*   Lipid Profile: No results for input(s): CHOL, HDL, LDLCALC, TRIG, CHOLHDL, LDLDIRECT in the last 72 hours. Thyroid Function Tests:  Recent Labs  10/24/15 0757  TSH 6.205*   Anemia Panel:  Recent Labs  10/24/15 0757  VITAMINB12 3481*  FOLATE 32.7  FERRITIN 731*  TIBC 235*  IRON 24*   Urine analysis:  Component Value Date/Time   COLORURINE AMBER* 10/19/2015 2022   APPEARANCEUR CLEAR 10/19/2015 2022   LABSPEC 1.019 10/19/2015 2022   PHURINE 5.5 10/19/2015 2022   GLUCOSEU NEGATIVE 10/19/2015 2022   HGBUR MODERATE* 10/19/2015 2022   BILIRUBINUR MODERATE* 10/19/2015 2022   KETONESUR NEGATIVE 10/19/2015 2022   PROTEINUR 30* 10/19/2015 2022   NITRITE NEGATIVE 10/19/2015 2022   LEUKOCYTESUR NEGATIVE 10/19/2015 2022   Sepsis Labs: @LABRCNTIP (procalcitonin:4,lacticidven:4)  ) Recent Results (from the past 240 hour(s))  Blood culture (routine x 2)     Status: None   Collection Time: 10/19/15  6:05 PM  Result Value Ref Range Status   Specimen Description BLOOD RIGHT ARM  Final   Special Requests BOTTLES DRAWN AEROBIC ONLY 5CC  Final   Culture NO GROWTH 5 DAYS  Final   Report Status 10/24/2015 FINAL  Final  Blood culture (routine x 2)     Status: Abnormal   Collection Time: 10/19/15  6:11 PM  Result Value Ref Range Status   Specimen Description BLOOD RIGHT HAND  Final   Special Requests BOTTLES DRAWN AEROBIC ONLY 5CC  Final   Culture  Setup Time   Final    GRAM POSITIVE COCCI IN CLUSTERS AEROBIC BOTTLE ONLY Organism ID to follow CRITICAL RESULT CALLED TO, READ BACK BY AND VERIFIED WITH: L CURRAN PHARMD 2130 10/20/15 A BROWNING    Culture (A)  Final    STAPHYLOCOCCUS SPECIES (COAGULASE NEGATIVE) THE SIGNIFICANCE OF ISOLATING THIS ORGANISM FROM A SINGLE SET OF BLOOD CULTURES WHEN MULTIPLE SETS ARE DRAWN IS UNCERTAIN. PLEASE NOTIFY THE MICROBIOLOGY DEPARTMENT WITHIN ONE WEEK IF SPECIATION AND  SENSITIVITIES ARE REQUIRED.    Report Status 10/23/2015 FINAL  Final  Blood Culture ID Panel (Reflexed)     Status: Abnormal   Collection Time: 10/19/15  6:11 PM  Result Value Ref Range Status   Enterococcus species NOT DETECTED NOT DETECTED Final   Vancomycin resistance NOT DETECTED NOT DETECTED Final   Listeria monocytogenes NOT DETECTED NOT DETECTED Final   Staphylococcus species DETECTED (A) NOT DETECTED Final    Comment: CRITICAL RESULT CALLED TO, READ BACK BY AND VERIFIED WITH: L CURRAN PHARMD 2130 10/20/15 A BROWNING    Staphylococcus aureus NOT DETECTED NOT DETECTED Final   Methicillin resistance NOT DETECTED NOT DETECTED Final   Streptococcus species NOT DETECTED NOT DETECTED Final   Streptococcus agalactiae NOT DETECTED NOT DETECTED Final   Streptococcus pneumoniae NOT DETECTED NOT DETECTED Final   Streptococcus pyogenes NOT DETECTED NOT DETECTED Final   Acinetobacter baumannii NOT DETECTED NOT DETECTED Final   Enterobacteriaceae species NOT DETECTED NOT DETECTED Final   Enterobacter cloacae complex NOT DETECTED NOT DETECTED Final   Escherichia coli NOT DETECTED NOT DETECTED Final   Klebsiella oxytoca NOT DETECTED NOT DETECTED Final   Klebsiella pneumoniae NOT DETECTED NOT DETECTED Final   Proteus species NOT DETECTED NOT DETECTED Final   Serratia marcescens NOT DETECTED NOT DETECTED Final   Carbapenem resistance NOT DETECTED NOT DETECTED Final   Haemophilus influenzae NOT DETECTED NOT DETECTED Final   Neisseria meningitidis NOT DETECTED NOT DETECTED Final   Pseudomonas aeruginosa NOT DETECTED NOT DETECTED Final   Candida albicans NOT DETECTED NOT DETECTED Final   Candida glabrata NOT DETECTED NOT DETECTED Final   Candida krusei NOT DETECTED NOT DETECTED Final   Candida parapsilosis NOT DETECTED NOT DETECTED Final   Candida tropicalis NOT DETECTED NOT DETECTED Final  MRSA PCR Screening     Status: Abnormal   Collection Time: 10/19/15 11:20 PM  Result Value Ref Range  Status   MRSA by PCR POSITIVE (A) NEGATIVE Final    Comment:        The GeneXpert MRSA Assay (FDA approved for NASAL specimens only), is one component of a comprehensive MRSA colonization surveillance program. It is not intended to diagnose MRSA infection nor to guide or monitor treatment for MRSA infections. CRITICAL RESULT CALLED TO, READ BACK BY AND VERIFIED WITH: CASEY MILLER AT [id number] 10/20/15.PMH   Urine culture     Status: None   Collection Time: 10/19/15 11:40 PM  Result Value Ref Range Status   Specimen Description URINE, CATHETERIZED  Final   Special Requests NONE  Final   Culture NO GROWTH  Final   Report Status 10/21/2015 FINAL  Final  Culture, blood (Routine X 2) w Reflex to ID Panel     Status: None (Preliminary result)   Collection Time: 10/22/15  9:45 AM  Result Value Ref Range Status   Specimen Description BLOOD LEFT ANTECUBITAL  Final   Special Requests   Final    BOTTLES DRAWN AEROBIC AND ANAEROBIC  4CC AER 6CC ANA   Culture NO GROWTH 2 DAYS  Final   Report Status PENDING  Incomplete  Culture, blood (Routine X 2) w Reflex to ID Panel     Status: None (Preliminary result)   Collection Time: 10/22/15 10:31 AM  Result Value Ref Range Status   Specimen Description BLOOD RIGHT HAND  Final   Special Requests BOTTLES DRAWN AEROBIC ONLY  8CC  Final   Culture NO GROWTH 2 DAYS  Final   Report Status PENDING  Incomplete      Radiology Studies: No results found.   Scheduled Meds: . ampicillin-sulbactam (UNASYN) IV  1.5 g Intravenous Q12H  . aspirin  81 mg Oral Daily  . carvedilol  25 mg Oral BID WC  . folic acid  1 mg Oral Daily  . gabapentin  600 mg Oral Daily  . heparin  5,000 Units Subcutaneous Q8H  . multivitamin with minerals  1 tablet Oral Daily  . pantoprazole  40 mg Oral Q1200  . thiamine  100 mg Oral Daily  . Vilazodone HCl  40 mg Oral Daily   Continuous Infusions:     LOS: 6 days    Time spent: 30 min    Janece Canterbury, MD [facility] Pager [phone]  If 7PM-7AM, please contact night-coverage [email or web address] Password [facility] 10/25/2015, 4:28 PM

## 2015-10-26 DIAGNOSIS — T50901A Poisoning by unspecified drugs, medicaments and biological substances, accidental (unintentional), initial encounter: Secondary | ICD-10-CM

## 2015-10-26 HISTORY — DX: Poisoning by unspecified drugs, medicaments and biological substances, accidental (unintentional), initial encounter: T50.901A

## 2015-10-26 LAB — BASIC METABOLIC PANEL
ANION GAP: 9 (ref 5–15)
BUN: 19 mg/dL (ref 6–20)
CO2: 23 mmol/L (ref 22–32)
Calcium: 8.3 mg/dL — ABNORMAL LOW (ref 8.9–10.3)
Chloride: 109 mmol/L (ref 101–111)
Creatinine, Ser: 1.61 mg/dL — ABNORMAL HIGH (ref 0.44–1.00)
GFR calc Af Amer: 41 mL/min — ABNORMAL LOW (ref >60)
GFR, EST NON AFRICAN AMERICAN: 35 mL/min — AB (ref >60)
GLUCOSE: 92 mg/dL (ref 65–99)
POTASSIUM: 3.9 mmol/L (ref 3.5–5.1)
Sodium: 141 mmol/L (ref 135–145)

## 2015-10-26 NOTE — Discharge Summary (Addendum)
Physician Discharge Summary  [name] [id number] DOB: [birth year] DOA: 10/19/2015  PCP: No primary care provider on file.  Admit date: 10/19/2015 Discharge date:  10/27/2015  Recommendations for Outpatient Follow-up:  1. F/u with PCP in 1-2 weeks for repeat CMP to check creatinine and LFTs 2. To SNF for PT/OT 3. Repeat iron studies and TFTs in 3-4 weeks 4. Cardiology referral  5. Psychiatry referral for prescription drug abuse and depression  Discharge Diagnoses:  Principal Problem:   Shock ([place]) Active Problems:   Aspiration pneumonia ([name])   Acute respiratory failure with hypoxia ([name])   Acute kidney injury ([name])   Transaminitis   Positive blood culture   Elevated troponin   Thrombocytopenia (H[facility])   Normocytic anemia   Accidental drug overdose   Discharge Condition: stable, improved  Diet recommendation: healthy heart  Wt Readings from Last 3 Encounters:  10/26/15 73.2 kg (161 lb 6 oz)    History of present illness:   [name] is a 57F with previous hx ETOH, mitral valve replacement, deafness who presented to the ED 6/11 with AMS, renal failure and rhabdomyolysis which she attributed to an unintentional overdose of muscle relaxant. Admitted with hypotension, suspected aspiration PNA, AKI.   Hospital Course:   Shock due to accidental overdose on muscle relaxant. Patient states she does not remember drinking alcohol but she did overtake her muscle relaxant after which she had LOC.  She has a history of substance abuse and thinks she was trying to get relief but may also have been trying to get high.  She does not trust herself with this medication anymore and does not want it prescribed again.  She repeatedly denied suicide attempt.  She was admitted to the ICU but did not require intubation.  She had associated liver and renal failure and evidence of rhabdomyolysis.  She was given IVF and her sedating medications were held.  Her metabolic encephalopathy and  multisystem organ failure gradually improved.   - Stopped all sedating and addictive medications and defer to patient's pain specialist  Acute hypoxic respiratory failure secondary to aspiration pneumonia.  She did not require intubation and this was likely secondary to initial LOC.  Completed a 7-day course of antibiotics on 6/18.    Acute kidney injury, likely ATN from hypotension and rhabdomyolysis.  Initially oliguric, but uop has improved and her creatinine has trended down from peak of 5.02 on 6/14 to 1.61 on 6/18.  RUS: No hydronephrosis  Rhabdomyolysis.  CK peaked at 2547 and trended down to wnl with IVF.    Acute hepatitis (shock liver vs EtOH vs rhabdo), LFTs trending down.   - Tylenol negative - Acute hepatitis panel negative  Hyperammonemia, likely related to shock and improving  Coag negative staph 2 out of 2 blood cx, likely contaminate - Repeat BCx from 6/14: NGTD  Troponin leak - likely 2/2 shock state and renal failure.  Troponins were minimally elevated and trended down as kidney function improved.  Outpatient cardiology follow up.  ECHO demonstrated EF of 55-60% with no regional wall motion abnormalities.  She was found to have grade 2 DD, mildly dilated RV, moderately dilated RA, moderate TR, PA peak pressure 40mmHg.    Hx prior ASD repair / MVR  Echo grade 2 dysfunction  Hypokalemia, oral potassium repletion   Normocytic anemia, likely due to AKI and acute illness. Consistent with inflammation and possibly some underlying iron deficiency. TSH mildly elevated, likely from sick euthyroid. Vitamin B12 level WNL.  - Repeat  iron studies and TSH in 3-4 weeks  Mild thrombocytopenia due to pneumonia and antibiotics and severe illness, recovering  Consultants:   PCCM transfer to hospitalist on 6/15  Procedures:  CVC placement. Removal on 6/15  Antimicrobials:  Unasyn 6/11 >> 6/18  Azithromycin 6/11 >> 6/14  Discharge Exam: Filed Vitals:    10/26/15 0553 10/26/15 1454  BP: 159/82 132/92  Pulse: 71 74  Temp: 97.8 F (36.6 C) 97.5 F (36.4 C)  Resp: 18 20   Filed Vitals:   10/25/15 1955 10/25/15 2148 10/26/15 0553 10/26/15 1454  BP: 134/71 130/84 159/82 132/92  Pulse: 74 71 71 74  Temp:  97.8 F (36.6 C) 97.8 F (36.6 C) 97.5 F (36.4 C)  TempSrc:      Resp:  18 18 20   Height:      Weight:   73.2 kg (161 lb 6 oz)   SpO2:  97% 100% 98%    General exam: Adult female. No acute distress.  HEENT: NCAT, MMM Respiratory system: Clear to auscultation bilaterally Cardiovascular system: Regular rate and rhythm, normal S1/S2. No murmurs, rubs, gallops or clicks. Warm extremities Gastrointestinal system: Normal active bowel sounds, soft, nondistended, nontender. MSK: Normal tone and bulk, trace nonpitting lower extremity edema Neuro: Grossly intact.   Discharge Instructions      Discharge Instructions    (HEART FAILURE PATIENTS) Call MD:  Anytime you have any of the following symptoms: 1) 3 pound weight gain in 24 hours or 5 pounds in 1 week 2) shortness of breath, with or without a dry hacking cough 3) swelling in the hands, feet or stomach 4) if you have to sleep on extra pillows at night in order to breathe.    Complete by:  As directed      Call MD for:  difficulty breathing, headache or visual disturbances    Complete by:  As directed      Call MD for:  extreme fatigue    Complete by:  As directed      Call MD for:  hives    Complete by:  As directed      Call MD for:  persistant dizziness or light-headedness    Complete by:  As directed      Call MD for:  persistant nausea and vomiting    Complete by:  As directed      Call MD for:  severe uncontrolled pain    Complete by:  As directed      Call MD for:  temperature >100.4    Complete by:  As directed      Diet - low sodium heart healthy    Complete by:  As directed      Increase activity slowly    Complete by:  As directed              Medication List    STOP taking these medications        amoxicillin-clavulanate 500-125 MG tablet  Commonly known as:  AUGMENTIN     fluconazole 100 MG tablet  Commonly known as:  DIFLUCAN     hydrochlorothiazide 12.5 MG capsule  Commonly known as:  MICROZIDE     OVER THE COUNTER MEDICATION     prazosin 2 MG capsule  Commonly known as:  MINIPRESS     ranitidine 150 MG tablet  Commonly known as:  ZANTAC      TAKE these medications        aspirin 81 MG chewable tablet  Chew 81 mg by mouth daily.     carvedilol 25 MG tablet  Commonly known as:  COREG  Take 25 mg by mouth 2 (two) times daily with a meal.     Fish Oil 1000 MG Caps  Take 1 capsule by mouth daily.     folic acid 1 MG tablet  Commonly known as:  FOLVITE  Take 1 mg by mouth daily.     gabapentin 600 MG tablet  Commonly known as:  NEURONTIN  Take 600 mg by mouth daily.     hydroxypropyl methylcellulose / hypromellose 2.5 % ophthalmic solution  Commonly known as:  ISOPTO TEARS / GONIOVISC  Place 1 drop into both eyes daily as needed for dry eyes.     ondansetron 4 MG disintegrating tablet  Commonly known as:  ZOFRAN-ODT  Take 4 mg by mouth every 6 (six) hours as needed for nausea or vomiting.     oxymetazoline 0.05 % nasal spray  Commonly known as:  AFRIN  Place 1 spray into both nostrils 2 (two) times daily as needed for congestion.     pantoprazole 40 MG tablet  Commonly known as:  PROTONIX  Take 40 mg by mouth daily.     trolamine salicylate 10 % cream  Commonly known as:  ASPERCREME  Apply 1 application topically daily as needed for muscle pain.     VIIBRYD 40 MG Tabs  Generic drug:  Vilazodone HCl  Take 40 mg by mouth daily.     vitamin B-12 500 MCG tablet  Commonly known as:  CYANOCOBALAMIN  Take 500 mcg by mouth daily.     Vitamin D3 2000 units Tabs  Take 1 tablet by mouth daily.          The results of significant diagnostics from this hospitalization (including imaging,  microbiology, ancillary and laboratory) are listed below for reference.    Significant Diagnostic Studies: Dg Chest 2 View  10/19/2015  CLINICAL DATA:  Wheezing, hypotension, possible drug overdose, altered mental status EXAM: CHEST  2 VIEW COMPARISON:  None. FINDINGS: Mild patchy opacity in the left upper lobe, suspicious for pneumonia, possibly on the basis of aspiration. Mild bibasilar opacities, possibly atelectasis. No definite pleural effusions. Cardiomegaly.  Prosthetic valve. Degenerative changes the visualized thoracolumbar spine, with compression fracture deformity at T12. Median sternotomy. IMPRESSION: Mild patchy left upper lobe opacity, suspicious for pneumonia, possibly on the basis of aspiration. Electronically Signed   By: Julian Hy M.D.   On: 10/19/2015 17:39   Ct Head Wo Contrast  10/19/2015  CLINICAL DATA:  Altered mental status.  Possible drug overdose. EXAM: CT HEAD WITHOUT CONTRAST TECHNIQUE: Contiguous axial images were obtained from the base of the skull through the vertex without intravenous contrast. COMPARISON:  None. FINDINGS: Metallic hearing aid device on the right, with associated streak artifacts. Diffusely enlarged ventricles and subarachnoid spaces. Patchy white matter low density in both cerebral hemispheres. No intracranial hemorrhage, mass lesion or CT evidence of acute infarction. Right maxillary sinus mucosal thickening. Unremarkable bones. IMPRESSION: 1. Streak artifacts from a hearing aid device, obscuring some of the intracranial detail. 2. No acute abnormality seen. 3. Mild to moderate atrophy and moderate chronic small vessel white matter ischemic changes in both cerebral hemispheres. 4. Chronic right maxillary sinusitis. Electronically Signed   By: Claudie Revering M.D.   On: 10/19/2015 17:50   US Renal  10/21/2015  CLINICAL DATA:  Renal failure.  MRSA. EXAM: RENAL / URINARY TRACT ULTRASOUND COMPLETE COMPARISON:  None.  FINDINGS: Suboptimal exam.   Noncompliant patient. Right Kidney: Length: 11.8 cm. Echogenicity within normal limits. No mass or hydronephrosis visualized. No perinephric fluid seen. Left Kidney: Length: 11.3 cm. Echogenicity within normal limits. No mass or hydronephrosis visualized. No perinephric fluid seen. Bladder: Bladder decompressed by Foley catheter. IMPRESSION: Kidneys appear grossly normal bilaterally.  No hydronephrosis. Electronically Signed   By: Franki Cabot M.D.   On: 10/21/2015 21:05   Dg Chest Port 1 View  10/23/2015  CLINICAL DATA:  Respiratory failure.  Shortness of breath. EXAM: PORTABLE CHEST 1 VIEW COMPARISON:  10/22/2015. FINDINGS: Right IJ line stable position. Prior CABG and cardiac valve replacement. Cardiomegaly again noted. Left mid lung and bibasilar infiltrates again noted. These changes could be related to pneumonia and/or pulmonary edema. Small bilateral pleural effusions. No pneumothorax. IMPRESSION: 1. Right IJ line stable position. 2. Prior CABG and cardiac valve replacement. Cardiomegaly again noted. Left mid and bibasilar infiltrates again noted and are unchanged. These findings could be related to pneumonia and/or pulmonary edema. Small bilateral pleural effusions. Chest is unchanged from prior exam. Electronically Signed   By: Harrison   On: 10/23/2015 06:44   Dg Chest Port 1 View  10/22/2015  CLINICAL DATA:  Respiratory failure. EXAM: PORTABLE CHEST 1 VIEW COMPARISON:  10/19/2015. FINDINGS: Cardiomegaly. Previous median sternotomy for valve replacement. No pneumothorax. IJ catheter tip SVC. BILATERAL pulmonary opacities are redemonstrated, LEFT greater than RIGHT, and stable. No effusion. IMPRESSION: Stable chest. Electronically Signed   By: Staci Righter M.D.   On: 10/22/2015 07:10   Dg Chest Portable 1 View  10/19/2015  CLINICAL DATA:  Central line placement EXAM: PORTABLE CHEST 1 VIEW COMPARISON:  10/19/2015 FINDINGS: Interval placement of a right central venous catheter with tip over  the mid SVC region. No pneumothorax. Postoperative changes in the mediastinum. Shallow inspiration. Linear atelectasis or infiltration in the left upper lung similar to prior study. No blunting of costophrenic angles. No pneumothorax. Mild cardiac enlargement. IMPRESSION: Right central venous catheter placed with tip over the mid SVC region. No pneumothorax. Persistent infiltration or atelectasis in the left upper lung. Electronically Signed   By: Lucienne Capers M.D.   On: 10/19/2015 21:42    Microbiology: Recent Results (from the past 240 hour(s))  Blood culture (routine x 2)     Status: None   Collection Time: 10/19/15  6:05 PM  Result Value Ref Range Status   Specimen Description BLOOD RIGHT ARM  Final   Special Requests BOTTLES DRAWN AEROBIC ONLY 5CC  Final   Culture NO GROWTH 5 DAYS  Final   Report Status 10/24/2015 FINAL  Final  Blood culture (routine x 2)     Status: Abnormal   Collection Time: 10/19/15  6:11 PM  Result Value Ref Range Status   Specimen Description BLOOD RIGHT HAND  Final   Special Requests BOTTLES DRAWN AEROBIC ONLY 5CC  Final   Culture  Setup Time   Final    GRAM POSITIVE COCCI IN CLUSTERS AEROBIC BOTTLE ONLY Organism ID to follow CRITICAL RESULT CALLED TO, READ BACK BY AND VERIFIED WITH: L CURRAN PHARMD 2130 10/20/15 A BROWNING    Culture (A)  Final    STAPHYLOCOCCUS SPECIES (COAGULASE NEGATIVE) THE SIGNIFICANCE OF ISOLATING THIS ORGANISM FROM A SINGLE SET OF BLOOD CULTURES WHEN MULTIPLE SETS ARE DRAWN IS UNCERTAIN. PLEASE NOTIFY THE MICROBIOLOGY DEPARTMENT WITHIN ONE WEEK IF SPECIATION AND SENSITIVITIES ARE REQUIRED.    Report Status 10/23/2015 FINAL  Final  Blood Culture ID Panel (Reflexed)  Status: Abnormal   Collection Time: 10/19/15  6:11 PM  Result Value Ref Range Status   Enterococcus species NOT DETECTED NOT DETECTED Final   Vancomycin resistance NOT DETECTED NOT DETECTED Final   Listeria monocytogenes NOT DETECTED NOT DETECTED Final    Staphylococcus species DETECTED (A) NOT DETECTED Final    Comment: CRITICAL RESULT CALLED TO, READ BACK BY AND VERIFIED WITH: L CURRAN PHARMD 2130 10/20/15 A BROWNING    Staphylococcus aureus NOT DETECTED NOT DETECTED Final   Methicillin resistance NOT DETECTED NOT DETECTED Final   Streptococcus species NOT DETECTED NOT DETECTED Final   Streptococcus agalactiae NOT DETECTED NOT DETECTED Final   Streptococcus pneumoniae NOT DETECTED NOT DETECTED Final   Streptococcus pyogenes NOT DETECTED NOT DETECTED Final   Acinetobacter baumannii NOT DETECTED NOT DETECTED Final   Enterobacteriaceae species NOT DETECTED NOT DETECTED Final   Enterobacter cloacae complex NOT DETECTED NOT DETECTED Final   Escherichia coli NOT DETECTED NOT DETECTED Final   Klebsiella oxytoca NOT DETECTED NOT DETECTED Final   Klebsiella pneumoniae NOT DETECTED NOT DETECTED Final   Proteus species NOT DETECTED NOT DETECTED Final   Serratia marcescens NOT DETECTED NOT DETECTED Final   Carbapenem resistance NOT DETECTED NOT DETECTED Final   Haemophilus influenzae NOT DETECTED NOT DETECTED Final   Neisseria meningitidis NOT DETECTED NOT DETECTED Final   Pseudomonas aeruginosa NOT DETECTED NOT DETECTED Final   Candida albicans NOT DETECTED NOT DETECTED Final   Candida glabrata NOT DETECTED NOT DETECTED Final   Candida krusei NOT DETECTED NOT DETECTED Final   Candida parapsilosis NOT DETECTED NOT DETECTED Final   Candida tropicalis NOT DETECTED NOT DETECTED Final  MRSA PCR Screening     Status: Abnormal   Collection Time: 10/19/15 11:20 PM  Result Value Ref Range Status   MRSA by PCR POSITIVE (A) NEGATIVE Final    Comment:        The GeneXpert MRSA Assay (FDA approved for NASAL specimens only), is one component of a comprehensive MRSA colonization surveillance program. It is not intended to diagnose MRSA infection nor to guide or monitor treatment for MRSA infections. CRITICAL RESULT CALLED TO, READ BACK BY AND VERIFIED  WITH: CASEY MILLER AT [id number] 10/20/15.PMH   Urine culture     Status: None   Collection Time: 10/19/15 11:40 PM  Result Value Ref Range Status   Specimen Description URINE, CATHETERIZED  Final   Special Requests NONE  Final   Culture NO GROWTH  Final   Report Status 10/21/2015 FINAL  Final  Culture, blood (Routine X 2) w Reflex to ID Panel     Status: None (Preliminary result)   Collection Time: 10/22/15  9:45 AM  Result Value Ref Range Status   Specimen Description BLOOD LEFT ANTECUBITAL  Final   Special Requests   Final    BOTTLES DRAWN AEROBIC AND ANAEROBIC  4CC AER 6CC ANA   Culture NO GROWTH 3 DAYS  Final   Report Status PENDING  Incomplete  Culture, blood (Routine X 2) w Reflex to ID Panel     Status: None (Preliminary result)   Collection Time: 10/22/15 10:31 AM  Result Value Ref Range Status   Specimen Description BLOOD RIGHT HAND  Final   Special Requests BOTTLES DRAWN AEROBIC ONLY  8CC  Final   Culture NO GROWTH 3 DAYS  Final   Report Status PENDING  Incomplete     Labs: Basic Metabolic Panel:  Recent Labs Lab 10/20/15 0448 10/21/15 0505 10/22/15 0506 10/23/15 0443 10/24/15  [id number] 10/25/15 0608 10/26/15 0336  NA 137 137 137 138 140 138 141  K 3.9 3.1* 3.5 3.3* 4.0 3.0* 3.9  CL 105 100* 106 105 109 106 109  CO2 21* 25 21* 22 23 24 23   GLUCOSE 139* 115* 102* 90 92 122* 92  BUN 43* 51* 56* 50* 40* 27* 19  CREATININE 3.65* 4.43* 5.02* 4.27* 3.21* 2.28* 1.61*  CALCIUM 6.6* 6.7* 7.8* 8.1* 8.2* 8.1* 8.3*  MG 1.6* 2.2  --  2.0  --   --   --   PHOS 8.5* 5.7* 5.1* 4.2  --   --   --    Liver Function Tests:  Recent Labs Lab 10/20/15 0448 10/21/15 0505 10/22/15 0959 10/24/15 0757 10/25/15 0608  AST 9304* 2796* 876* 207* 114*  ALT 2638* 1697* 1127* 575* 417*  ALKPHOS 213* 194* 176* 145* 129*  BILITOT 0.8 0.7 0.7 1.5* 0.5  PROT 5.2* 4.8* 5.4* 4.9* 5.0*  ALBUMIN 2.7* 2.6* 2.4* 2.2* 2.2*    Recent Labs Lab 10/19/15 1814  LIPASE 46    Recent Labs Lab  10/19/15 2215 10/21/15 1530  AMMONIA 70* 52*   CBC:  Recent Labs Lab 10/19/15 1814  10/20/15 0448 10/22/15 0506 10/23/15 0443 10/24/15 0757 10/25/15 0608  WBC 7.1  < > 8.9 3.2* 3.8* 4.1 4.7  NEUTROABS 6.2  --   --   --   --   --   --   HGB 11.4*  < > 10.4* 9.3* 9.1* 9.5* 9.1*  HCT 34.8*  < > 31.6* 28.2* 27.3* 29.2* 27.5*  MCV 97.8  < > 96.9 97.2 95.1 95.1 94.8  PLT 193  < > 220 117* 114* 134* 129*  < > = values in this interval not displayed. Cardiac Enzymes:  Recent Labs Lab 10/19/15 2122 10/20/15 1045 10/21/15 0505 10/21/15 1150 10/22/15 0959 10/22/15 1855 10/22/15 2343 10/24/15 0757 10/25/15 0608  CKTOTAL 1935* 2547* 1771*  --   --   --   --  194 133  TROPONINI  --   --   --  0.56* 0.32* 0.23* 0.20*  --   --    BNP: BNP (last 3 results) No results for input(s): BNP in the last 8760 hours.  ProBNP (last 3 results) No results for input(s): PROBNP in the last 8760 hours.  CBG:  Recent Labs Lab 10/21/15 1938 10/21/15 2352 10/22/15 0426 10/22/15 0817 10/22/15 1140  GLUCAP 99 98 87 94 114*    Time coordinating discharge: 35 minutes  Signed:  SHORT, MACKENZIE  [facility] 10/26/2015, 4:53 PM   [place]

## 2015-10-27 ENCOUNTER — Encounter (HOSPITAL_COMMUNITY): Payer: Self-pay | Admitting: Emergency Medicine

## 2015-10-27 DIAGNOSIS — E559 Vitamin D deficiency, unspecified: Secondary | ICD-10-CM | POA: Diagnosis not present

## 2015-10-27 DIAGNOSIS — R2689 Other abnormalities of gait and mobility: Secondary | ICD-10-CM | POA: Diagnosis not present

## 2015-10-27 DIAGNOSIS — D696 Thrombocytopenia, unspecified: Secondary | ICD-10-CM | POA: Diagnosis not present

## 2015-10-27 DIAGNOSIS — J189 Pneumonia, unspecified organism: Secondary | ICD-10-CM | POA: Diagnosis not present

## 2015-10-27 DIAGNOSIS — K219 Gastro-esophageal reflux disease without esophagitis: Secondary | ICD-10-CM | POA: Diagnosis not present

## 2015-10-27 DIAGNOSIS — H04129 Dry eye syndrome of unspecified lacrimal gland: Secondary | ICD-10-CM | POA: Diagnosis not present

## 2015-10-27 DIAGNOSIS — R1314 Dysphagia, pharyngoesophageal phase: Secondary | ICD-10-CM | POA: Diagnosis not present

## 2015-10-27 DIAGNOSIS — E539 Vitamin B deficiency, unspecified: Secondary | ICD-10-CM | POA: Diagnosis not present

## 2015-10-27 DIAGNOSIS — R278 Other lack of coordination: Secondary | ICD-10-CM | POA: Diagnosis not present

## 2015-10-27 DIAGNOSIS — E440 Moderate protein-calorie malnutrition: Secondary | ICD-10-CM | POA: Diagnosis not present

## 2015-10-27 DIAGNOSIS — D649 Anemia, unspecified: Secondary | ICD-10-CM | POA: Diagnosis not present

## 2015-10-27 LAB — CULTURE, BLOOD (ROUTINE X 2)
CULTURE: NO GROWTH
Culture: NO GROWTH

## 2015-10-27 LAB — BASIC METABOLIC PANEL
Anion gap: 6 (ref 5–15)
BUN: 13 mg/dL (ref 6–20)
CHLORIDE: 109 mmol/L (ref 101–111)
CO2: 26 mmol/L (ref 22–32)
CREATININE: 1.5 mg/dL — AB (ref 0.44–1.00)
Calcium: 8.5 mg/dL — ABNORMAL LOW (ref 8.9–10.3)
GFR calc non Af Amer: 38 mL/min — ABNORMAL LOW (ref >60)
GFR, EST AFRICAN AMERICAN: 44 mL/min — AB (ref >60)
GLUCOSE: 90 mg/dL (ref 65–99)
Potassium: 4.7 mmol/L (ref 3.5–5.1)
Sodium: 141 mmol/L (ref 135–145)

## 2015-10-27 NOTE — Progress Notes (Addendum)
Patient was discharged to [facility]) by MD order; discharged instructions review and sent to facility by social work. Patient is going home first with family and then she is going to facility. She also received a copy of her paper work discharge.  IV DIC; Many attempts to give report to the nurse who is going to receive the patient, but nobody answered. RN left the number to the secretary; patient will be escorted to the car by nurse tech via wheelchair.

## 2015-10-27 NOTE — Clinical Social Work Placement (Signed)
   CLINICAL SOCIAL WORK PLACEMENT  NOTE  Date:  10/27/2015  Patient Details  Name: [name] MRN: [id number] Date of Birth: Jun 21, 1960  Clinical Social Work is seeking post-discharge placement for this patient at the [place] level of care (*CSW will initial, date and re-position this form in  chart as items are completed):  Yes   Patient/family provided with [facility] Work Department's list of facilities offering this level of care within the geographic area requested by the patient (or if unable, by the patient's family).  Yes   Patient/family informed of their freedom to choose among providers that offer the needed level of care, that participate in Medicare, Medicaid or managed care program needed by the patient, have an available bed and are willing to accept the patient.  Yes   Patient/family informed of [facility]'s ownership interest in [facility] and [facility], as well as of the fact that they are under no obligation to receive care at these facilities.  PASRR submitted to EDS on 10/27/15     PASRR number received on 10/27/15     Existing PASRR number confirmed on       FL2 transmitted to all facilities in geographic area requested by pt/family on 10/27/15     FL2 transmitted to all facilities within larger geographic area on       Patient informed that his/her managed care company has contracts with or will negotiate with certain facilities, including the following:            Patient/family informed of bed offers received.  Patient chooses bed at       Physician recommends and patient chooses bed at      Patient to be transferred to   on  .  Patient to be transferred to facility by       Patient family notified on   of transfer.  Name of family member notified:        PHYSICIAN Please sign FL2     Additional Comment:    _______________________________________________ Benard Halsted, [place] 10/27/2015, 8:35  AM

## 2015-10-27 NOTE — NC FL2 (Signed)
[place] LEVEL OF CARE SCREENING TOOL     IDENTIFICATION  Patient Name: [name] Birthdate: 1961-01-23 Sex: female Admission Date (Current Location): 10/19/2015  [facility] and [place] Number:  Herbalist and Address:  [facility] [place] [address], [place], [place] [address]      Provider Number: [id number]  Attending Physician Name and Address:  [name], MD  Relative Name and Phone Number:  [name], son, [phone]    Current Level of Care: Hospital Recommended Level of Care: [place] Prior Approval Number:    Date Approved/Denied:   PASRR Number: [id number]  Discharge Plan: SNF    Current Diagnoses: Patient Active Problem List   Diagnosis Date Noted  . Accidental drug overdose 10/26/2015  . Aspiration pneumonia ([place]) 10/23/2015  . Acute respiratory failure with hypoxia ([place]) 10/23/2015  . Acute kidney injury ([place]) 10/23/2015  . Transaminitis 10/23/2015  . Positive blood culture 10/23/2015  . Elevated troponin 10/23/2015  . Thrombocytopenia ([place]) 10/23/2015  . Normocytic anemia 10/23/2015  . Shock ([place]) 10/19/2015    Orientation RESPIRATION BLADDER Height & Weight     Self, Time, Situation, Place  Normal Continent Weight: 71.7 kg (158 lb 1.1 oz) Height:  5\' 4"  (162.6 cm)  BEHAVIORAL SYMPTOMS/MOOD NEUROLOGICAL BOWEL NUTRITION STATUS      Continent Diet (Please see DC Summary)  AMBULATORY STATUS COMMUNICATION OF NEEDS Skin   Limited Assist Verbally Normal                       Personal Care Assistance Level of Assistance  Bathing, Feeding, Dressing Bathing Assistance: Limited assistance Feeding assistance: Independent Dressing Assistance: Independent     Functional Limitations Info  Sight, Hearing Sight Info: Impaired Hearing Info: Impaired (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 0.9 %  sodium chloride infusion  250 mL Intravenous PRN Dannielle Burn, MD 10 mL/hr at 10/22/15 1000 250 mL at 10/22/15 1000  . aspirin chewable tablet 81 mg  81 mg Oral Daily Janece Canterbury, MD   81 mg at 10/26/15 1005  . carvedilol (COREG) tablet 25 mg  25 mg Oral BID WC Janece Canterbury, MD   25 mg at 10/26/15 1659  . folic acid (FOLVITE) tablet 1 mg  1 mg Oral Daily Dannielle Burn, MD   1 mg at 10/26/15 1006  . gabapentin (NEURONTIN) tablet 600 mg  600 mg Oral Daily Janece Canterbury, MD   600 mg at 10/26/15 1005  . heparin injection 5,000 Units  5,000 Units Subcutaneous Q8H Dannielle Burn, MD   5,000 Units at 10/27/15 0532  . lactulose (CHRONULAC) 10 GM/15ML solution 10 g  10 g Oral Daily PRN Janece Canterbury, MD      . multivitamin with minerals tablet 1 tablet  1 tablet Oral Daily Dannielle Burn, MD   1 tablet at 10/26/15 1006  . ondansetron (ZOFRAN) injection 4  mg  4 mg Intravenous Q8H PRN Rigoberto Noel, MD   4 mg at 10/23/15 1801  . pantoprazole (PROTONIX) EC tablet 40 mg  40 mg Oral Q1200 Dannielle Burn, MD   40 mg at 10/26/15 1258  . potassium chloride SA (K-DUR,KLOR-CON) CR tablet 40 mEq  40 mEq Oral Daily Janece Canterbury, MD   40 mEq at 10/26/15 1005  . sodium chloride flush (NS) 0.9 % injection 10 mL  10 mL Intravenous PRN Collene Gobble, MD   10 mL at 10/22/15 0506  . thiamine (VITAMIN B-1) tablet 100 mg  100 mg Oral Daily Dannielle Burn, MD   100 mg at 10/26/15 1006  . traMADol (ULTRAM) tablet 50 mg  50 mg Oral Q12H PRN Janece Canterbury, MD   50 mg at 10/25/15 1957  . Vilazodone HCl (VIIBRYD) TABS 40 mg  40  mg Oral Daily Janece Canterbury, MD   40 mg at 10/26/15 1005     Discharge Medications: Please see discharge summary for a list of discharge medications.  Relevant Imaging Results:  Relevant Lab Results:   Additional Information SSN: [place], [place]

## 2015-10-27 NOTE — Progress Notes (Signed)
Physical Therapy Treatment Patient Details Name: [name] MRN: [id number] DOB: 1960-06-22 Today's Date: 10/27/2015    History of Present Illness Ms. Zinman is a 75F with previous hx ETOH, mitral valve replacement, deafness who presented to the ED 6/11 with AMS, renal failure and rhabdomyolysis which she attributed to an unintentional overdose of muscle relaxant. Admitted with shock hypotension, suspected aspiration PNA, AKI.     PT Comments    Current plan remains appropriate.   Follow Up Recommendations  SNF     Equipment Recommendations   (TBA next venue)    Recommendations for Other Services       Precautions / Restrictions Precautions Precautions: Fall Restrictions Weight Bearing Restrictions: No    Mobility  Bed Mobility Overal bed mobility: Modified Independent Bed Mobility: Supine to Sit;Sit to Supine     Supine to sit: Modified independent (Device/Increase time) Sit to supine: Modified independent (Device/Increase time)   General bed mobility comments: increased time  Transfers Overall transfer level: Needs assistance Equipment used: None Transfers: Sit to/from Stand Sit to Stand: Min guard         General transfer comment: min guard for safety; a little unsteady upon stand but no LOB  Ambulation/Gait Ambulation/Gait assistance: Min guard;Min assist Ambulation Distance (Feet): 80 Feet Assistive device: None Gait Pattern/deviations: Step-through pattern;Decreased stride length;Wide base of support Gait velocity: decreased   General Gait Details: fatigued quickly; 4 standing rest breaks; VSS; reported feeling like her "heart is racing"; unsteadiness at times especially with direction changes and turning    Stairs            Wheelchair Mobility    Modified Rankin (Stroke Patients Only)       Balance     Sitting balance-Leahy Scale: Good       Standing balance-Leahy Scale: Fair                      Cognition  Arousal/Alertness: Awake/alert Behavior During Therapy: WFL for tasks assessed/performed Overall Cognitive Status: No family/caregiver present to determine baseline cognitive functioning Area of Impairment: Problem solving             Problem Solving: Slow processing      Exercises General Exercises - Lower Extremity Hip Flexion/Marching: Strengthening;Both;10 reps;Standing Heel Raises: Strengthening;Both;10 reps;Standing Mini-Sqauts: Strengthening;10 reps;Standing    General Comments        Pertinent Vitals/Pain Pain Assessment: Faces Faces Pain Scale: No hurt    Home Living                      Prior Function            PT Goals (current goals can now be found in the care plan section) Acute Rehab PT Goals Patient Stated Goal: to get stronger  PT Goal Formulation: With patient Time For Goal Achievement: 10/31/15 Potential to Achieve Goals: Good Progress towards PT goals: Progressing toward goals    Frequency  Min 3X/week    PT Plan Current plan remains appropriate    Co-evaluation             End of Session Equipment Utilized During Treatment: Gait belt Activity Tolerance: Patient limited by fatigue Patient left: with call bell/phone within reach;in bed     Time: 1210-1230 PT Time Calculation (min) (ACUTE ONLY): 20 min  Charges:  $Gait Training: 8-22 mins  G Codes:      [place] April, PTA Pager: [phone]   10/27/2015, 12:37 PM

## 2015-10-27 NOTE — Clinical Social Work Placement (Signed)
   CLINICAL SOCIAL WORK PLACEMENT  NOTE  Date:  10/27/2015  Patient Details  Name: [name] MRN: [id number] Date of Birth: [birth year]  Clinical Social Work is seeking post-discharge placement for this patient at the [place] level of care (*CSW will initial, date and re-position this form in  chart as items are completed):  Yes   Patient/family provided with [place] Work Department's list of facilities offering this level of care within the geographic area requested by the patient (or if unable, by the patient's family).  Yes   Patient/family informed of their freedom to choose among providers that offer the needed level of care, that participate in Medicare, Medicaid or managed care program needed by the patient, have an available bed and are willing to accept the patient.  Yes   Patient/family informed of [facility]'s ownership interest in [facility] and [facility], as well as of the fact that they are under no obligation to receive care at these facilities.  PASRR submitted to EDS on 10/27/15     PASRR number received on 10/27/15     Existing PASRR number confirmed on       FL2 transmitted to all facilities in geographic area requested by pt/family on 10/27/15     FL2 transmitted to all facilities within larger geographic area on       Patient informed that his/her managed care company has contracts with or will negotiate with certain facilities, including the following:        Yes   Patient/family informed of bed offers received.  Patient chooses bed at [facility]     Physician recommends and patient chooses bed at      Patient to be transferred to [facility] on 10/27/15.  Patient to be transferred to facility by Car     Patient family notified on 10/27/15 of transfer.  Name of family member notified:  [name]     PHYSICIAN Please sign FL2     Additional Comment:     _______________________________________________ Benard Halsted, [place] 10/27/2015, 11:10 AM

## 2015-10-27 NOTE — Progress Notes (Signed)
Patient will DC to: [facility] Anticipated DC date: 10/27/15 Family notified: Son & friend Transport by: Friend transporting patient by car at 1:30pm   Per MD patient ready for DC to [facility]. RN, patient, patient's family, and facility notified of DC. RN given number for report.   CSW signing off.  Cedric Fishman, [place] Social Worker [phone]

## 2015-10-27 NOTE — Telephone Encounter (Signed)
Patient had appointment June 13 for reclast but she was at the hospital. She is being transferred to a rehab facility according to patient's Mom and will not be available for the next 2 weeks but wants to reschedule reclast. Please call patient with time and date. Thx   I called [name] and LM regarding repeat blood work and then we will reschedule Reclast.

## 2015-10-27 NOTE — Clinical Social Work Note (Signed)
Clinical Social Work Assessment  Patient Details  Name: [name] MRN: [id number] Date of Birth: 1960-06-19  Date of referral:  10/27/15               Reason for consult:  Facility Placement                Permission sought to share information with:  Facility Sport and exercise psychologist, Family Supports Permission granted to share information::  Yes, Verbal Permission Granted  Name::     [employer]  Agency::  [facility] [employer] SNFs  Relationship::  Son  Contact Information:     Housing/Transportation Living arrangements for the past 2 months:  Apartment Source of Information:  Patient Patient Interpreter Needed:  None Criminal Activity/Legal Involvement Pertinent to Current Situation/Hospitalization:  No - Comment as needed Significant Relationships:  Adult Children Lives with:  Self Do you feel safe going back to the place where you live?  No Need for family participation in patient care:  Yes (Comment)  Care giving concerns:  CSW received referral for possible SNF placement at time of discharge. CSW met with patient at bedside regarding PT recommendation of SNF placement at time of discharge. Patient reports living alone and is currently unable to care for herself at home given patient's current physical needs. Patient expressed understanding of PT recommendation and is agreeable to SNF placement at time of discharge. CSW to continue to follow and assist with discharge planning needs.   Social Worker assessment / plan:  CSW spoke with patient concerning possibility of rehab at [facility] before returning home.  Employment status:  Disabled (Comment on whether or not currently receiving Disability) Insurance information:  Medicare PT Recommendations:  [place] / Referral to community resources:  [place]  Patient/Family's Response to care:  Patient recognizes need for rehab before returning home and is agreeable to a SNF in [place]. Patient  reported preference for [facility] or [facility]. Patient states she would like her son to take her to SNF by car.  Patient/Family's Understanding of and Emotional Response to Diagnosis, Current Treatment, and Prognosis:  Patient is realistic regarding therapy needs. No questions/concerns about plan or treatment.    Emotional Assessment Appearance:  Appears stated age Attitude/Demeanor/Rapport:  Other (Appropriate) Affect (typically observed):  Accepting, Appropriate, Pleasant Orientation:  Oriented to Self, Oriented to Place, Oriented to  Time, Oriented to Situation Alcohol / Substance use:  Not Applicable Psych involvement (Current and /or in the community):  No (Comment)  Discharge Needs  Concerns to be addressed:  Care Coordination Readmission within the last 30 days:  No Current discharge risk:  None Barriers to Discharge:  No Barriers Identified   Benard Halsted, [place] 10/27/2015, 8:33 AM

## 2015-10-28 DIAGNOSIS — J189 Pneumonia, unspecified organism: Secondary | ICD-10-CM | POA: Diagnosis not present

## 2015-10-28 DIAGNOSIS — D696 Thrombocytopenia, unspecified: Secondary | ICD-10-CM | POA: Diagnosis not present

## 2015-10-28 DIAGNOSIS — E559 Vitamin D deficiency, unspecified: Secondary | ICD-10-CM | POA: Diagnosis not present

## 2015-10-28 DIAGNOSIS — D649 Anemia, unspecified: Secondary | ICD-10-CM | POA: Diagnosis not present

## 2015-10-28 DIAGNOSIS — E440 Moderate protein-calorie malnutrition: Secondary | ICD-10-CM | POA: Diagnosis not present

## 2015-10-28 DIAGNOSIS — K219 Gastro-esophageal reflux disease without esophagitis: Secondary | ICD-10-CM | POA: Diagnosis not present

## 2015-11-04 NOTE — Telephone Encounter (Addendum)
Phone call from pt , she is home and had lab work in hospital. I will ask Dr Toney Rakes about labs due to abnormal Calcium and Creatinine  Before we schedule . Called  explained to Madigan I will forward labs to Dr Toney Rakes.. Talked with Jenniferanne she is going home tomorrow from rehab.

## 2015-11-04 NOTE — Telephone Encounter (Signed)
She was to follow-up with her PCP to make sure her creatinine her calcium her corrected before we can proceed with the Prolia

## 2015-11-07 DIAGNOSIS — K2970 Gastritis, unspecified, without bleeding: Secondary | ICD-10-CM | POA: Diagnosis not present

## 2015-11-07 DIAGNOSIS — T48201D Poisoning by unspecified drugs acting on muscles, accidental (unintentional), subsequent encounter: Secondary | ICD-10-CM | POA: Diagnosis not present

## 2015-11-07 DIAGNOSIS — M797 Fibromyalgia: Secondary | ICD-10-CM | POA: Diagnosis not present

## 2015-11-07 DIAGNOSIS — R112 Nausea with vomiting, unspecified: Secondary | ICD-10-CM | POA: Diagnosis not present

## 2015-11-08 ENCOUNTER — Encounter (HOSPITAL_COMMUNITY): Payer: Self-pay | Admitting: Emergency Medicine

## 2015-11-08 ENCOUNTER — Emergency Department (HOSPITAL_COMMUNITY)
Admission: EM | Admit: 2015-11-08 | Discharge: 2015-11-08 | Disposition: A | Discharge status: Home / Self Care | Payer: Medicare Other | Attending: Emergency Medicine | Admitting: Emergency Medicine

## 2015-11-08 DIAGNOSIS — F329 Major depressive disorder, single episode, unspecified: Secondary | ICD-10-CM | POA: Insufficient documentation

## 2015-11-08 DIAGNOSIS — Z79899 Other long term (current) drug therapy: Secondary | ICD-10-CM | POA: Insufficient documentation

## 2015-11-08 DIAGNOSIS — M1990 Unspecified osteoarthritis, unspecified site: Secondary | ICD-10-CM | POA: Diagnosis not present

## 2015-11-08 DIAGNOSIS — Z7982 Long term (current) use of aspirin: Secondary | ICD-10-CM | POA: Insufficient documentation

## 2015-11-08 DIAGNOSIS — I10 Essential (primary) hypertension: Secondary | ICD-10-CM | POA: Diagnosis not present

## 2015-11-08 DIAGNOSIS — A084 Viral intestinal infection, unspecified: Secondary | ICD-10-CM | POA: Insufficient documentation

## 2015-11-08 DIAGNOSIS — R109 Unspecified abdominal pain: Secondary | ICD-10-CM | POA: Diagnosis present

## 2015-11-08 LAB — URINE MICROSCOPIC-ADD ON
RBC / HPF: NONE SEEN RBC/hpf (ref 0–5)
SQUAMOUS EPITHELIAL / LPF: NONE SEEN

## 2015-11-08 LAB — CBC
HEMATOCRIT: 28.5 % — AB (ref 36.0–46.0)
HEMOGLOBIN: 9.8 g/dL — AB (ref 12.0–15.0)
MCH: 31.6 pg (ref 26.0–34.0)
MCHC: 34.4 g/dL (ref 30.0–36.0)
MCV: 91.9 fL (ref 78.0–100.0)
Platelets: 283 10*3/uL (ref 150–400)
RBC: 3.1 MIL/uL — AB (ref 3.87–5.11)
RDW: 13.9 % (ref 11.5–15.5)
WBC: 4.6 10*3/uL (ref 4.0–10.5)

## 2015-11-08 LAB — COMPREHENSIVE METABOLIC PANEL
ALBUMIN: 3.8 g/dL (ref 3.5–5.0)
ALK PHOS: 125 U/L (ref 38–126)
ALT: 30 U/L (ref 14–54)
ANION GAP: 14 (ref 5–15)
AST: 35 U/L (ref 15–41)
BUN: 14 mg/dL (ref 6–20)
CALCIUM: 8.6 mg/dL — AB (ref 8.9–10.3)
CO2: 20 mmol/L — ABNORMAL LOW (ref 22–32)
Chloride: 105 mmol/L (ref 101–111)
Creatinine, Ser: 0.94 mg/dL (ref 0.44–1.00)
GFR calc Af Amer: >60 mL/min (ref >60)
GFR calc non Af Amer: >60 mL/min (ref >60)
Glucose, Bld: 86 mg/dL (ref 65–99)
POTASSIUM: 3.8 mmol/L (ref 3.5–5.1)
SODIUM: 139 mmol/L (ref 135–145)
TOTAL PROTEIN: 7.1 g/dL (ref 6.5–8.1)
Total Bilirubin: 0.6 mg/dL (ref 0.3–1.2)

## 2015-11-08 LAB — URINALYSIS, ROUTINE W REFLEX MICROSCOPIC
BILIRUBIN URINE: NEGATIVE
Glucose, UA: NEGATIVE mg/dL
Hgb urine dipstick: NEGATIVE
KETONES UR: NEGATIVE mg/dL
NITRITE: NEGATIVE
PH: 6 (ref 5.0–8.0)
Protein, ur: NEGATIVE mg/dL
SPECIFIC GRAVITY, URINE: 1.018 (ref 1.005–1.030)

## 2015-11-08 LAB — LIPASE, BLOOD: LIPASE: 90 U/L — AB (ref 11–51)

## 2015-11-08 MED ORDER — ONDANSETRON HCL 4 MG/2ML IJ SOLN
4.0000 mg | Freq: Once | INTRAMUSCULAR | Status: AC
  Administered 2015-11-08: 4 mg via INTRAVENOUS
  Filled 2015-11-08: qty 2

## 2015-11-08 MED ORDER — GLYCOPYRROLATE 0.2 MG/ML IJ SOLN: 0.1000 mg | Freq: Once | INTRAMUSCULAR | Status: DC

## 2015-11-08 MED ORDER — DIPHENOXYLATE-ATROPINE 2.5-0.025 MG PO TABS
2.0000 | ORAL_TABLET | Freq: Once | ORAL | Status: AC
  Administered 2015-11-08: 2 via ORAL
  Filled 2015-11-08: qty 2

## 2015-11-08 MED ORDER — DIPHENOXYLATE-ATROPINE 2.5-0.025 MG PO TABS: 1.0000 | ORAL_TABLET | Freq: Four times a day (QID) | ORAL | Status: DC | PRN

## 2015-11-08 MED ORDER — ONDANSETRON 4 MG PO TBDP: 4.0000 mg | ORAL_TABLET | Freq: Three times a day (TID) | ORAL | Status: DC | PRN

## 2015-11-08 MED ORDER — MORPHINE SULFATE (PF) 4 MG/ML IV SOLN
4.0000 mg | INTRAVENOUS | Status: DC | PRN
  Administered 2015-11-08: 4 mg via INTRAVENOUS
  Filled 2015-11-08: qty 1

## 2015-11-08 MED ORDER — GLYCOPYRROLATE 0.2 MG/ML IJ SOLN
0.2000 mg | Freq: Once | INTRAMUSCULAR | Status: AC
  Administered 2015-11-08: 0.2 mg via INTRAVENOUS
  Filled 2015-11-08: qty 1

## 2015-11-08 MED ORDER — SODIUM CHLORIDE 0.9 % IV BOLUS (SEPSIS)
1000.0000 mL | Freq: Once | INTRAVENOUS | Status: AC
  Administered 2015-11-08: 1000 mL via INTRAVENOUS

## 2015-11-08 MED ORDER — DICYCLOMINE HCL 20 MG PO TABS: 20.0000 mg | ORAL_TABLET | Freq: Two times a day (BID) | ORAL | Status: DC

## 2015-11-08 NOTE — ED Notes (Signed)
MD at bedside. 

## 2015-11-08 NOTE — Discharge Instructions (Signed)

## 2015-11-08 NOTE — ED Notes (Addendum)
Pt from home via EMS with complaints of "feeling like her heart is racing" with no tachycardia. Pt has a hx of anxiety. Pt also has upper abdominal pain. Pt took 2 of her anti anxiety pills after she felt like her heart was racing, and her stomach pain began after that. Pt states she has not eaten today. Pt is dry heaving at time of assessment. Pt states that this began about an hour ago. Pt states she experienced about 1 episode of emesis.   Pt is hearing impaired.

## 2015-11-08 NOTE — ED Provider Notes (Signed)
CSN: [id number]     Arrival date & time 11/08/15  0026 History  By signing my name below, I, [name], attest that this documentation has been prepared under the direction and in the presence of No att. providers found. Electronically signed, [name], ED Scribe. 11/08/2015. 1:34 AM.   Chief Complaint  Patient presents with  . Abdominal Pain  . Anxiety  . Emesis  . Hypotension   Patient is a 55 y.o. female presenting with anxiety and vomiting. The history is provided by the patient. No language interpreter was used.  Anxiety Associated symptoms include abdominal pain. Pertinent negatives include no chest pain, no headaches and no shortness of breath.  Emesis Associated symptoms: abdominal pain and diarrhea   Associated symptoms: no chills, no headaches and no sore throat    HPI Comments: [name] is a 55 y.o. female with a PMHx of GERD, who presents to the Emergency Department complaining of constant, cramping abdominal pain onset this evening. Pt reports she woke up from her sleep tonight when the abdominal pain started. Pt states she did not do much today and believes her abdominal pain could be due to not eating. Pt reports one episode of vomiting, diarrhea at home and one episode of vomiting in the ambulance. Pt states she currently fells nauseas. Pt reports no modifying factors to her abdominal pain. Pt denies hematuria, dysuria.   Past Medical History  Diagnosis Date  . Depression   . Migraine   . Fibromyalgia   . Hearing impairment   . Arthritis   . Cardiomyopathy   . Pulmonary embolism ([place])   . Gastritis   . Osteoporosis   . Anemia   . Allergy     SEASONAL  . Anxiety   . GERD (gastroesophageal reflux disease)   . Hypertension     [name], take htn medication to regulate heart beat.  . Sleep apnea   . Vitamin D deficiency   . Deaf    Past Surgical History  Procedure Laterality Date  . Appendectomy    . Inner ear surgery      TUBES  . Tonsillectomy and  adenoidectomy    . Tubal ligation    . Laparoscopic cholecystectomy  2012  . Cardiac surgery  January 2007    mitral valve repair  . Cardiac surgery  1973    atrial septal defect  . Cochlear implant    . Cardiac surgery     Family History  Problem Relation Age of Onset  . Breast cancer Mother     bilateral; ages 22 and 6; TAH/BSO ~50  . Depression Sister   . Heart disease Father   . Hypertension Father   . Colon cancer Neg Hx   . Esophageal cancer Neg Hx   . Pancreatic cancer Neg Hx   . Rectal cancer Neg Hx   . Stomach cancer Neg Hx    Social History  Substance Use Topics  . Smoking status: Never Smoker   . Smokeless tobacco: Never Used  . Alcohol Use: 0.0 oz/week    0 Standard drinks or equivalent per week   OB History    Gravida Para Term Preterm AB TAB SAB Ectopic Multiple Living   2 2 2  0 0 0 0 0       Review of Systems  Constitutional: Negative for fever, chills, diaphoresis, appetite change and fatigue.  HENT: Negative for mouth sores, sore throat and trouble swallowing.   Eyes: Negative for visual disturbance.  Respiratory: Negative for cough, chest tightness, shortness of breath and wheezing.   Cardiovascular: Negative for chest pain.  Gastrointestinal: Positive for vomiting, abdominal pain and diarrhea. Negative for nausea and abdominal distention.  Endocrine: Negative for polydipsia, polyphagia and polyuria.  Genitourinary: Negative for dysuria, frequency and hematuria.  Musculoskeletal: Negative for gait problem.  Skin: Negative for color change, pallor and rash.  Neurological: Negative for dizziness, syncope, light-headedness and headaches.  Hematological: Does not bruise/bleed easily.  Psychiatric/Behavioral: Negative for behavioral problems and confusion.  All other systems reviewed and are negative.     Allergies  Depakote; Valium; and Diazepam  Home Medications   Prior to Admission medications   Medication Sig Start Date End Date Taking?  Authorizing Provider  aspirin 81 MG chewable tablet Chew 81 mg by mouth daily.   Yes Historical Provider, MD  carvedilol (COREG) 25 MG tablet TAKE 1 TABLET BY MOUTH TWICE A DAY WITH MEALS (APPOINTMENT NEEDED FOR ADDITIONAL REFILLS) 10/17/15  Yes Jerline Pain, MD  Cholecalciferol (VITAMIN D3) 2000 units TABS Take 1 tablet by mouth daily.   Yes Historical Provider, MD  folic acid (FOLVITE) 1 MG tablet Take 1 mg by mouth daily.   Yes Historical Provider, MD  gabapentin (NEURONTIN) 600 MG tablet Take 600 mg by mouth at bedtime.    Yes Historical Provider, MD  hydrochlorothiazide (MICROZIDE) 12.5 MG capsule Take 1 capsule (12.5 mg total) by mouth every morning. 05/16/14  Yes Jerline Pain, MD  hydroxypropyl methylcellulose / hypromellose (ISOPTO TEARS / GONIOVISC) 2.5 % ophthalmic solution Place 1 drop into both eyes daily as needed for dry eyes.   Yes Historical Provider, MD  mirabegron ER (MYRBETRIQ) 50 MG TB24 tablet Take 1 tablet (50 mg total) by mouth daily. 07/17/14  Yes Terrance Mass, MD  Multiple Vitamin (MULTIVITAMIN WITH MINERALS) TABS tablet Take 1 tablet by mouth daily.   Yes Historical Provider, MD  Omega-3 Fatty Acids (FISH OIL) 1000 MG CAPS Take 1 capsule by mouth daily.   Yes Historical Provider, MD  oxymetazoline (AFRIN) 0.05 % nasal spray Place 1 spray into both nostrils 2 (two) times daily as needed for congestion.   Yes Historical Provider, MD  pantoprazole (PROTONIX) 40 MG tablet TAKE 1 TABLET (40 MG TOTAL) BY MOUTH DAILY. 03/29/15  Yes Davonna Belling, MD  trolamine salicylate (ASPERCREME) 10 % cream Apply 1 application topically daily as needed for muscle pain.   Yes Historical Provider, MD  Vilazodone HCl (VIIBRYD) 40 MG TABS Take 40 mg by mouth daily.   Yes Historical Provider, MD  vitamin B-12 (CYANOCOBALAMIN) 500 MCG tablet Take 500 mcg by mouth daily.   Yes Historical Provider, MD  cyclobenzaprine (FLEXERIL) 10 MG tablet Take 1 tablet (10 mg total) by mouth 2 (two) times daily  as needed for muscle spasms. Patient not taking: Reported on 11/08/2015 09/25/15   Gareth Morgan, MD  dicyclomine (BENTYL) 20 MG tablet Take 1 tablet (20 mg total) by mouth 2 (two) times daily. 11/08/15   [name], MD  diphenoxylate-atropine (LOMOTIL) 2.5-0.025 MG tablet Take 1 tablet by mouth 4 (four) times daily as needed for diarrhea or loose stools. 11/08/15   [name], MD  HYDROcodone-acetaminophen (NORCO) 5-325 MG tablet Take 1 tablet by mouth every 6 (six) hours as needed for severe pain. Patient not taking: Reported on 09/25/2015 09/16/15   Mercedes Camprubi-Soms, PA-C  HYDROcodone-acetaminophen (NORCO/VICODIN) 5-325 MG tablet Take 1 tablet by mouth every 4 (four) hours as needed for moderate pain or severe  pain. Patient not taking: Reported on 09/16/2015 07/17/15   Clayton Bibles, PA-C  ondansetron (ZOFRAN ODT) 4 MG disintegrating tablet Take 1 tablet (4 mg total) by mouth every 8 (eight) hours as needed for nausea. 11/08/15   [name], MD  oxyCODONE (ROXICODONE) 5 MG immediate release tablet Take 1 tablet (5 mg total) by mouth every 4 (four) hours as needed for severe pain. Patient not taking: Reported on 11/08/2015 10/06/15   Deno Etienne, DO  penicillin v potassium (VEETID) 500 MG tablet Take 1 tablet (500 mg total) by mouth 4 (four) times daily. Patient not taking: Reported on 09/16/2015 07/17/15   Clayton Bibles, PA-C  ranitidine (ZANTAC) 150 MG tablet Take 1 tablet (150 mg total) by mouth 2 (two) times daily. Patient not taking: Reported on 09/25/2015 09/16/15   Mercedes Camprubi-Soms, PA-C   BP 121/81 mmHg  Pulse 84  Temp(Src) 98.4 F (36.9 C) (Oral)  Resp 18  Ht 5\' 3"  (1.6 m)  Wt 145 lb (65.772 kg)  BMI 25.69 kg/m2  SpO2 98% Physical Exam  Constitutional: She is oriented to person, place, and time. She appears well-developed and well-nourished. No distress.  HENT:  Head: Normocephalic.  Left cochlear implant  Eyes: Conjunctivae are normal. Pupils are equal, round, and reactive to light. No scleral  icterus.  Neck: Normal range of motion. Neck supple. No thyromegaly present.  Cardiovascular: Normal rate and regular rhythm.  Exam reveals no gallop and no friction rub.   No murmur heard. Pulmonary/Chest: Effort normal and breath sounds normal. No respiratory distress. She has no wheezes. She has no rales.  Abdominal: Soft. Bowel sounds are normal. She exhibits no distension. There is no tenderness. There is no rebound.  Musculoskeletal: Normal range of motion.  Neurological: She is alert and oriented to person, place, and time.  Skin: Skin is warm and dry. No rash noted.  Psychiatric: She has a normal mood and affect. Her behavior is normal.  Nursing note and vitals reviewed.   ED Course  Procedures  DIAGNOSTIC STUDIES: Oxygen Saturation is 98% on RA, normal by my interpretation.  COORDINATION OF CARE: 1:23 AM-Will order blood work and urinalysis. Discussed treatment plan with pt at bedside and pt agreed to plan.   Labs Review Labs Reviewed  LIPASE, BLOOD - Abnormal; Notable for the following:    Lipase 90 (*)    All other components within normal limits  COMPREHENSIVE METABOLIC PANEL - Abnormal; Notable for the following:    CO2 20 (*)    Calcium 8.6 (*)    All other components within normal limits  CBC - Abnormal; Notable for the following:    RBC 3.10 (*)    Hemoglobin 9.8 (*)    HCT 28.5 (*)    All other components within normal limits  URINALYSIS, ROUTINE W REFLEX MICROSCOPIC (NOT AT [facility]) - Abnormal; Notable for the following:    APPearance CLOUDY (*)    Leukocytes, UA SMALL (*)    All other components within normal limits  URINE MICROSCOPIC-ADD ON - Abnormal; Notable for the following:    Bacteria, UA FEW (*)    Casts HYALINE CASTS (*)    All other components within normal limits    Imaging Review No results found. I have personally reviewed and evaluated these images and lab results as part of my medical decision-making.   EKG Interpretation None       MDM   Final diagnoses:  Viral gastroenteritis   I personally performed the services described  in this documentation, which was scribed in my presence. The recorded information has been reviewed and is accurate.     [name], MD 11/20/15 [phone]

## 2015-11-12 ENCOUNTER — Telehealth: Payer: Self-pay | Admitting: Gynecology

## 2015-11-12 NOTE — Telephone Encounter (Signed)
Reclast scheduled for 10/21/15  Patient was in hospital. Need to reschedule. Per Dr Toney Rakes due to abnormal lab results pt much be cleared with PCP prior to Reclast infusion.

## 2015-11-13 DIAGNOSIS — T48201D Poisoning by unspecified drugs acting on muscles, accidental (unintentional), subsequent encounter: Secondary | ICD-10-CM | POA: Diagnosis not present

## 2015-11-13 DIAGNOSIS — M797 Fibromyalgia: Secondary | ICD-10-CM | POA: Diagnosis not present

## 2015-11-14 DIAGNOSIS — D649 Anemia, unspecified: Secondary | ICD-10-CM | POA: Diagnosis not present

## 2015-11-14 DIAGNOSIS — R899 Unspecified abnormal finding in specimens from other organs, systems and tissues: Secondary | ICD-10-CM | POA: Diagnosis not present

## 2015-11-14 DIAGNOSIS — D696 Thrombocytopenia, unspecified: Secondary | ICD-10-CM | POA: Diagnosis not present

## 2015-11-14 DIAGNOSIS — L03119 Cellulitis of unspecified part of limb: Secondary | ICD-10-CM | POA: Diagnosis not present

## 2015-11-14 NOTE — Telephone Encounter (Signed)
PC left message that Dr Toney Rakes would like for her to have clearance from PCP prior to Reclast due to recent abnormal lab result levels. I have left a message for Jennilyn to call me back.

## 2015-11-17 DIAGNOSIS — Z7901 Long term (current) use of anticoagulants: Secondary | ICD-10-CM | POA: Diagnosis not present

## 2015-11-18 DIAGNOSIS — M797 Fibromyalgia: Secondary | ICD-10-CM | POA: Diagnosis not present

## 2015-11-18 DIAGNOSIS — T48201D Poisoning by unspecified drugs acting on muscles, accidental (unintentional), subsequent encounter: Secondary | ICD-10-CM | POA: Diagnosis not present

## 2015-11-25 DIAGNOSIS — H9190 Unspecified hearing loss, unspecified ear: Secondary | ICD-10-CM | POA: Diagnosis not present

## 2015-11-25 DIAGNOSIS — R799 Abnormal finding of blood chemistry, unspecified: Secondary | ICD-10-CM | POA: Diagnosis not present

## 2015-11-25 DIAGNOSIS — I959 Hypotension, unspecified: Secondary | ICD-10-CM | POA: Diagnosis not present

## 2015-11-25 DIAGNOSIS — R899 Unspecified abnormal finding in specimens from other organs, systems and tissues: Secondary | ICD-10-CM | POA: Diagnosis not present

## 2015-11-25 DIAGNOSIS — D649 Anemia, unspecified: Secondary | ICD-10-CM | POA: Diagnosis not present

## 2015-11-25 DIAGNOSIS — G894 Chronic pain syndrome: Secondary | ICD-10-CM | POA: Diagnosis not present

## 2015-11-25 DIAGNOSIS — Z7901 Long term (current) use of anticoagulants: Secondary | ICD-10-CM | POA: Diagnosis not present

## 2015-11-25 DIAGNOSIS — E785 Hyperlipidemia, unspecified: Secondary | ICD-10-CM | POA: Diagnosis not present

## 2015-11-25 DIAGNOSIS — L03119 Cellulitis of unspecified part of limb: Secondary | ICD-10-CM | POA: Diagnosis not present

## 2015-11-25 DIAGNOSIS — Z952 Presence of prosthetic heart valve: Secondary | ICD-10-CM | POA: Diagnosis not present

## 2015-11-25 DIAGNOSIS — M797 Fibromyalgia: Secondary | ICD-10-CM | POA: Diagnosis not present

## 2015-11-25 DIAGNOSIS — F329 Major depressive disorder, single episode, unspecified: Secondary | ICD-10-CM | POA: Diagnosis not present

## 2015-11-25 DIAGNOSIS — I059 Rheumatic mitral valve disease, unspecified: Secondary | ICD-10-CM | POA: Diagnosis not present

## 2015-11-25 DIAGNOSIS — M810 Age-related osteoporosis without current pathological fracture: Secondary | ICD-10-CM | POA: Diagnosis not present

## 2015-11-27 DIAGNOSIS — T48201D Poisoning by unspecified drugs acting on muscles, accidental (unintentional), subsequent encounter: Secondary | ICD-10-CM | POA: Diagnosis not present

## 2015-11-27 DIAGNOSIS — M797 Fibromyalgia: Secondary | ICD-10-CM | POA: Diagnosis not present

## 2015-11-27 DIAGNOSIS — H47323 Drusen of optic disc, bilateral: Secondary | ICD-10-CM | POA: Diagnosis not present

## 2015-12-02 DIAGNOSIS — T48201D Poisoning by unspecified drugs acting on muscles, accidental (unintentional), subsequent encounter: Secondary | ICD-10-CM | POA: Diagnosis not present

## 2015-12-02 DIAGNOSIS — M797 Fibromyalgia: Secondary | ICD-10-CM | POA: Diagnosis not present

## 2015-12-04 ENCOUNTER — Emergency Department (HOSPITAL_COMMUNITY)
Admission: EM | Admit: 2015-12-04 | Discharge: 2015-12-05 | Disposition: A | Discharge status: Home / Self Care | Payer: Medicare Other | Source: Home / Self Care | Attending: Emergency Medicine | Admitting: Emergency Medicine

## 2015-12-04 ENCOUNTER — Emergency Department (HOSPITAL_COMMUNITY): Payer: Medicare Other

## 2015-12-04 ENCOUNTER — Encounter (HOSPITAL_COMMUNITY): Payer: Self-pay | Admitting: *Deleted

## 2015-12-04 DIAGNOSIS — K760 Fatty (change of) liver, not elsewhere classified: Secondary | ICD-10-CM | POA: Diagnosis not present

## 2015-12-04 DIAGNOSIS — Y999 Unspecified external cause status: Secondary | ICD-10-CM

## 2015-12-04 DIAGNOSIS — R109 Unspecified abdominal pain: Secondary | ICD-10-CM | POA: Diagnosis not present

## 2015-12-04 DIAGNOSIS — R112 Nausea with vomiting, unspecified: Secondary | ICD-10-CM | POA: Diagnosis not present

## 2015-12-04 DIAGNOSIS — E872 Acidosis: Secondary | ICD-10-CM | POA: Diagnosis not present

## 2015-12-04 DIAGNOSIS — W19XXXA Unspecified fall, initial encounter: Secondary | ICD-10-CM

## 2015-12-04 DIAGNOSIS — I10 Essential (primary) hypertension: Secondary | ICD-10-CM | POA: Insufficient documentation

## 2015-12-04 DIAGNOSIS — Y939 Activity, unspecified: Secondary | ICD-10-CM | POA: Insufficient documentation

## 2015-12-04 DIAGNOSIS — S52571A Other intraarticular fracture of lower end of right radius, initial encounter for closed fracture: Secondary | ICD-10-CM

## 2015-12-04 DIAGNOSIS — Y929 Unspecified place or not applicable: Secondary | ICD-10-CM

## 2015-12-04 DIAGNOSIS — Z7982 Long term (current) use of aspirin: Secondary | ICD-10-CM | POA: Insufficient documentation

## 2015-12-04 DIAGNOSIS — F1010 Alcohol abuse, uncomplicated: Secondary | ICD-10-CM | POA: Insufficient documentation

## 2015-12-04 DIAGNOSIS — S52501A Unspecified fracture of the lower end of right radius, initial encounter for closed fracture: Secondary | ICD-10-CM

## 2015-12-04 DIAGNOSIS — S52592A Other fractures of lower end of left radius, initial encounter for closed fracture: Secondary | ICD-10-CM | POA: Diagnosis not present

## 2015-12-04 MED ORDER — OXYCODONE-ACETAMINOPHEN 5-325 MG PO TABS
1.0000 | ORAL_TABLET | Freq: Once | ORAL | Status: AC
  Administered 2015-12-05: 1 via ORAL
  Filled 2015-12-04: qty 1

## 2015-12-04 NOTE — ED Notes (Signed)
[name], Son [phone]

## 2015-12-04 NOTE — ED Triage Notes (Addendum)
Pt is very hard of hearing, family member reports pt heavily drinking on Tuesday, had a fall and possible right arm fracture. Last drank this am and also needing detox from etoh. No acute distress noted at triage.

## 2015-12-05 DIAGNOSIS — M47812 Spondylosis without myelopathy or radiculopathy, cervical region: Secondary | ICD-10-CM | POA: Diagnosis not present

## 2015-12-05 MED ORDER — OXYCODONE-ACETAMINOPHEN 5-325 MG PO TABS
1.0000 | ORAL_TABLET | Freq: Once | ORAL | Status: AC
Start: 2015-12-05 — End: 2015-12-05
  Administered 2015-12-05: 1 via ORAL
  Filled 2015-12-05: qty 1

## 2015-12-05 MED ORDER — HYDROCODONE-ACETAMINOPHEN 5-325 MG PO TABS: 1.0000 | ORAL_TABLET | ORAL | 0 refills | Status: DC | PRN

## 2015-12-05 NOTE — ED Provider Notes (Signed)
[facility] DEPT Provider Note   CSN: [id number] Arrival date & time: 12/04/15  1750  First Provider Contact:  None       History   Chief Complaint Chief Complaint  Patient presents with  . Fall    HPI [name] is a 55 y.o. female.  HPI   Patient to the ER bib family member for right arm injury. The family member also request the patient get treatment for ETOH detox. Patient drinks significant quantitiy of alcohol. She fell this past Tuesday after heavily drinking. She denies loc or head/neck pain. She has since had right arm bruising, swelling and decreased ROM. The patient denies to me that she last drank this morning. She is hard of hearing but interview was done by writing and she says it has been a few days. She denies that she has ever gotten sick when she hasn't had any alcohol, denies tremors or history of seizures. Pt is resting comfortably at this time.  Past Medical History:  Diagnosis Date  . Allergy    SEASONAL  . Anemia   . Anxiety   . Arthritis   . Cardiomyopathy   . Deaf   . Depression   . Fibromyalgia   . Gastritis   . GERD (gastroesophageal reflux disease)   . Hearing impairment   . Hypertension    [name], take htn medication to regulate heart beat.  . Migraine   . Osteoporosis   . Pulmonary embolism ([place])   . Sleep apnea   . Vitamin D deficiency     Patient Active Problem List   Diagnosis Date Noted  . Accidental drug overdose 10/26/2015  . Aspiration pneumonia ([place]) 10/23/2015  . Acute respiratory failure with hypoxia ([place]) 10/23/2015  . Acute kidney injury ([place]) 10/23/2015  . Transaminitis 10/23/2015  . Positive blood culture 10/23/2015  . Elevated troponin 10/23/2015  . Thrombocytopenia ([place]) 10/23/2015  . Normocytic anemia 10/23/2015  . Shock ([place]) 10/19/2015  . Major depressive disorder, recurrent episode, moderate ([place]) 09/18/2014  . Non-traumatic compression fracture of vertebral column with routine healing 07/17/2014  .  Osteoporosis 07/17/2014  . H/O vitamin D deficiency 07/17/2014  . Odynophagia 07/09/2014  . Dysphagia 07/08/2014  . Chest pain 05/20/2014  . Nausea & vomiting 05/20/2014  . 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 Clinical depression 03/29/2014  . Breath shortness 03/29/2014  . Hypercholesterolemia without hypertriglyceridemia 03/29/2014  . Fatigue 03/29/2014  . Classical migraine with intractable migraine 03/29/2014  . Disorder of mitral valve 03/29/2014  . Atypical migraine 03/29/2014  . Atrial septal defect of fossa ovalis 03/29/2014  . HLD (hyperlipidemia) 03/29/2014  . Arthralgia of multiple joints 03/29/2014  . Anemia, iron deficiency 03/29/2014  . Fibrositis 03/29/2014  . Abdominal pain 12/20/2013  . Absolute anemia 09/20/2013  . Bronchitis 09/20/2013  . Vaginitis and vulvovaginitis 09/20/2013  . Family history of breast cancer 07/13/2013  . Palpitation 07/02/2013  . History of mitral valve repair 07/02/2013  . Status post patch closure of ASD 07/02/2013  . Hyperlipidemia 07/02/2013  . OAB (overactive bladder) 06/13/2013  . Ovarian cyst, left 03/15/2013  . Vaginal lesion 03/15/2013  . Ovarian mass 03/15/2013  . Maternal DVT (deep vein thrombosis), history of 02/26/2013  . PMB (postmenopausal bleeding) 02/26/2013  . Alcohol dependence ([place]) 11/07/2012  . Unspecified vitamin D deficiency 08/17/2012  . L1 vertebral fracture ([place]) 08/01/2012  . Spinal compression fracture ([place]) 07/12/2012  . Menopausal state 07/12/2012  . Psoriasis 07/12/2012  . Major depressive  disorder, recurrent episode, severe, without mention of psychotic behavior 12/07/2011  . Generalized anxiety disorder 12/07/2011  . Cardiomyopathy 03/16/2011  . Hearing impairment   . Migraine   . Arthritis     Past Surgical History:  Procedure Laterality Date  . APPENDECTOMY    . CARDIAC SURGERY  January 2007   mitral valve repair  . CARDIAC SURGERY  1973   atrial  septal defect  . CARDIAC SURGERY    . COCHLEAR IMPLANT    . INNER EAR SURGERY     TUBES  . LAPAROSCOPIC CHOLECYSTECTOMY  2012  . TONSILLECTOMY AND ADENOIDECTOMY    . TUBAL LIGATION      OB History    Gravida Para Term Preterm AB Living   2 2 2  0 0     SAB TAB Ectopic Multiple Live Births   0 0 0           Home Medications    Prior to Admission medications   Medication Sig Start Date End Date Taking? Authorizing Provider  aspirin 81 MG chewable tablet Chew 81 mg by mouth daily.   Yes Historical Provider, MD  carvedilol (COREG) 25 MG tablet TAKE 1 TABLET BY MOUTH TWICE A DAY WITH MEALS (APPOINTMENT NEEDED FOR ADDITIONAL REFILLS) 10/17/15  Yes Jerline Pain, MD  Cholecalciferol (VITAMIN D3) 2000 units TABS Take 1 tablet by mouth daily.   Yes Historical Provider, MD  dicyclomine (BENTYL) 20 MG tablet Take 1 tablet (20 mg total) by mouth 2 (two) times daily. 11/08/15  Yes [name], MD  diphenoxylate-atropine (LOMOTIL) 2.5-0.025 MG tablet Take 1 tablet by mouth 4 (four) times daily as needed for diarrhea or loose stools. 11/08/15  Yes [name], MD  folic acid (FOLVITE) 1 MG tablet Take 1 mg by mouth daily.   Yes Historical Provider, MD  gabapentin (NEURONTIN) 600 MG tablet Take 600 mg by mouth at bedtime.    Yes Historical Provider, MD  hydrochlorothiazide (MICROZIDE) 12.5 MG capsule Take 1 capsule (12.5 mg total) by mouth every morning. 05/16/14  Yes Jerline Pain, MD  hydroxypropyl methylcellulose / hypromellose (ISOPTO TEARS / GONIOVISC) 2.5 % ophthalmic solution Place 1 drop into both eyes daily as needed for dry eyes.   Yes Historical Provider, MD  mirabegron ER (MYRBETRIQ) 50 MG TB24 tablet Take 1 tablet (50 mg total) by mouth daily. 07/17/14  Yes Terrance Mass, MD  Multiple Vitamin (MULTIVITAMIN WITH MINERALS) TABS tablet Take 1 tablet by mouth daily.   Yes Historical Provider, MD  Omega-3 Fatty Acids (FISH OIL) 1000 MG CAPS Take 1 capsule by mouth daily.   Yes Historical Provider, MD   ondansetron (ZOFRAN ODT) 4 MG disintegrating tablet Take 1 tablet (4 mg total) by mouth every 8 (eight) hours as needed for nausea. 11/08/15  Yes [name], MD  oxymetazoline (AFRIN) 0.05 % nasal spray Place 1 spray into both nostrils 2 (two) times daily as needed for congestion.   Yes Historical Provider, MD  pantoprazole (PROTONIX) 40 MG tablet TAKE 1 TABLET (40 MG TOTAL) BY MOUTH DAILY. 03/29/15  Yes Davonna Belling, MD  trolamine salicylate (ASPERCREME) 10 % cream Apply 1 application topically daily as needed for muscle pain.   Yes Historical Provider, MD  Vilazodone HCl (VIIBRYD) 40 MG TABS Take 40 mg by mouth daily.   Yes Historical Provider, MD  vitamin B-12 (CYANOCOBALAMIN) 500 MCG tablet Take 500 mcg by mouth daily.   Yes Historical Provider, MD  cyclobenzaprine (FLEXERIL) 10 MG tablet  Take 1 tablet (10 mg total) by mouth 2 (two) times daily as needed for muscle spasms. Patient not taking: Reported on 11/08/2015 09/25/15   Gareth Morgan, MD  HYDROcodone-acetaminophen (NORCO) 5-325 MG tablet Take 1 tablet by mouth every 6 (six) hours as needed for severe pain. Patient not taking: Reported on 09/25/2015 09/16/15   Mercedes Camprubi-Soms, PA-C  HYDROcodone-acetaminophen (NORCO/VICODIN) 5-325 MG tablet Take 1 tablet by mouth every 4 (four) hours as needed for moderate pain or severe pain. Patient not taking: Reported on 09/16/2015 07/17/15   Clayton Bibles, PA-C  oxyCODONE (ROXICODONE) 5 MG immediate release tablet Take 1 tablet (5 mg total) by mouth every 4 (four) hours as needed for severe pain. Patient not taking: Reported on 11/08/2015 10/06/15   Deno Etienne, DO  penicillin v potassium (VEETID) 500 MG tablet Take 1 tablet (500 mg total) by mouth 4 (four) times daily. Patient not taking: Reported on 09/16/2015 07/17/15   Clayton Bibles, PA-C  ranitidine (ZANTAC) 150 MG tablet Take 1 tablet (150 mg total) by mouth 2 (two) times daily. Patient not taking: Reported on 09/25/2015 09/16/15   Mercedes Camprubi-Soms, PA-C      Family History Family History  Problem Relation Age of Onset  . Breast cancer Mother     bilateral; ages 47 and 42; TAH/BSO ~50  . Depression Sister   . Heart disease Father   . Hypertension Father   . Colon cancer Neg Hx   . Esophageal cancer Neg Hx   . Pancreatic cancer Neg Hx   . Rectal cancer Neg Hx   . Stomach cancer Neg Hx     Social History Social History  Substance Use Topics  . Smoking status: Never Smoker  . Smokeless tobacco: Never Used  . Alcohol use 0.0 oz/week     Allergies   Depakote [divalproex sodium]; Valium; and Diazepam   Review of Systems Review of Systems  Review of Systems All other systems negative except as documented in the HPI. All pertinent positives and negatives as reviewed in the HPI.  Physical Exam Updated Vital Signs BP 115/76   Pulse 86   Temp 98.4 F (36.9 C) (Oral)   Resp 17   SpO2 100%   Physical Exam  Constitutional: She appears well-developed and well-nourished. No distress.  HENT:  Head: Normocephalic and atraumatic.  Eyes: Pupils are equal, round, and reactive to light.  Neck: Normal range of motion. Neck supple.  Cardiovascular: Normal rate and regular rhythm.   Pulmonary/Chest: Effort normal.  Abdominal: Soft.  Musculoskeletal:       Right wrist: She exhibits decreased range of motion, tenderness, bony tenderness, swelling and crepitus. She exhibits no effusion, no deformity and no laceration.  Neurological: She is alert.  Skin: Skin is warm and dry.  Psychiatric: She has a normal mood and affect. Her behavior is normal. She expresses no homicidal and no suicidal ideation.  Nursing note and vitals reviewed.    ED Treatments / Results  Labs (all labs ordered are listed, but only abnormal results are displayed) Labs Reviewed - No data to display  EKG  EKG Interpretation None       Radiology Dg Forearm Right  Result Date: 12/04/2015 CLINICAL DATA:  Fall today with right distal forearm and wrist  pain with swelling and bruising. EXAM: RIGHT FOREARM - 2 VIEW COMPARISON:  None. FINDINGS: Comminuted fracture the distal radial metaphysis extends to the articular surface apex anterior angulation noted at fracture site. No associated ulnar fracture is identified.  IMPRESSION: Comminuted distal radius fracture with intra-articular extension. Electronically Signed   By: Misty Stanley M.D.   On: 12/04/2015 19:16  Dg Wrist Complete Right  Result Date: 12/04/2015 CLINICAL DATA:  Fall earlier today with wrist pain and swelling. EXAM: RIGHT WRIST - COMPLETE 3+ VIEW COMPARISON:  None. FINDINGS: Comminuted fracture of the distal radius identified with intra-articular extension. No associated fracture the distal ulna. Carpal alignment is anatomic. There are some degenerative changes at the first carpometacarpal joint and also at the first MCP joint. IMPRESSION: Comminuted distal radius fracture with intra-articular extension. Electronically Signed   By: Misty Stanley M.D.   On: 12/04/2015 19:17   Procedures Procedures (including critical care time)  Medications Ordered in ED Medications  oxyCODONE-acetaminophen (PERCOCET/ROXICET) 5-325 MG per tablet 1 tablet (1 tablet Oral Given 12/05/15 0029)     Initial Impression / Assessment and Plan / ED Course  I have reviewed the triage vital signs and the nursing notes.  Pertinent labs & imaging results that were available during my care of the patient were reviewed by me and considered in my medical decision making (see chart for details).  Clinical Course    Case discussed with Dr. Percell Miller- pt can follow-up in office. Will place splint in ED with arm sling. Discussed pain medication and no drinking with patient. She agrees to not drink and says she has not in a few days due to the pain in her arm.  Denies SI/ HI, does not have any withdrawal symptoms and does not request inpatient detox.  Final Clinical Impressions(s) / ED Diagnoses   Final diagnoses:    Distal radial fracture, right, closed, initial encounter    New Prescriptions New Prescriptions   No medications on file     Delos Haring, PA-C 12/05/15 [place], MD 12/07/15 [phone]

## 2015-12-06 ENCOUNTER — Emergency Department (HOSPITAL_COMMUNITY): Payer: Medicare Other

## 2015-12-06 ENCOUNTER — Inpatient Hospital Stay (HOSPITAL_COMMUNITY)
Admission: EM | Admit: 2015-12-06 | Discharge: 2015-12-07 | DRG: 641 | Disposition: A | Discharge status: Home / Self Care | Payer: Medicare Other | Attending: Internal Medicine | Admitting: Internal Medicine

## 2015-12-06 ENCOUNTER — Encounter (HOSPITAL_COMMUNITY): Payer: Self-pay | Admitting: Emergency Medicine

## 2015-12-06 DIAGNOSIS — Z8249 Family history of ischemic heart disease and other diseases of the circulatory system: Secondary | ICD-10-CM

## 2015-12-06 DIAGNOSIS — I10 Essential (primary) hypertension: Secondary | ICD-10-CM

## 2015-12-06 DIAGNOSIS — F1029 Alcohol dependence with unspecified alcohol-induced disorder: Secondary | ICD-10-CM | POA: Diagnosis not present

## 2015-12-06 DIAGNOSIS — Z888 Allergy status to other drugs, medicaments and biological substances status: Secondary | ICD-10-CM

## 2015-12-06 DIAGNOSIS — E872 Acidosis: Secondary | ICD-10-CM | POA: Diagnosis not present

## 2015-12-06 DIAGNOSIS — F329 Major depressive disorder, single episode, unspecified: Secondary | ICD-10-CM | POA: Diagnosis present

## 2015-12-06 DIAGNOSIS — Z7982 Long term (current) use of aspirin: Secondary | ICD-10-CM | POA: Diagnosis not present

## 2015-12-06 DIAGNOSIS — R824 Acetonuria: Secondary | ICD-10-CM | POA: Diagnosis present

## 2015-12-06 DIAGNOSIS — F1020 Alcohol dependence, uncomplicated: Secondary | ICD-10-CM | POA: Diagnosis present

## 2015-12-06 DIAGNOSIS — Z803 Family history of malignant neoplasm of breast: Secondary | ICD-10-CM

## 2015-12-06 DIAGNOSIS — R1011 Right upper quadrant pain: Secondary | ICD-10-CM

## 2015-12-06 DIAGNOSIS — Z9049 Acquired absence of other specified parts of digestive tract: Secondary | ICD-10-CM

## 2015-12-06 DIAGNOSIS — E8729 Other acidosis: Secondary | ICD-10-CM

## 2015-12-06 DIAGNOSIS — R109 Unspecified abdominal pain: Secondary | ICD-10-CM | POA: Diagnosis present

## 2015-12-06 DIAGNOSIS — Z79899 Other long term (current) drug therapy: Secondary | ICD-10-CM | POA: Diagnosis not present

## 2015-12-06 DIAGNOSIS — F10230 Alcohol dependence with withdrawal, uncomplicated: Secondary | ICD-10-CM | POA: Diagnosis present

## 2015-12-06 DIAGNOSIS — R002 Palpitations: Secondary | ICD-10-CM | POA: Diagnosis present

## 2015-12-06 DIAGNOSIS — Z86711 Personal history of pulmonary embolism: Secondary | ICD-10-CM

## 2015-12-06 DIAGNOSIS — R112 Nausea with vomiting, unspecified: Secondary | ICD-10-CM | POA: Diagnosis not present

## 2015-12-06 DIAGNOSIS — K760 Fatty (change of) liver, not elsewhere classified: Secondary | ICD-10-CM | POA: Diagnosis not present

## 2015-12-06 HISTORY — DX: Acidosis: E87.2

## 2015-12-06 HISTORY — DX: Other acidosis: E87.29

## 2015-12-06 LAB — URINALYSIS, ROUTINE W REFLEX MICROSCOPIC
BILIRUBIN URINE: NEGATIVE
Glucose, UA: NEGATIVE mg/dL
Leukocytes, UA: NEGATIVE
NITRITE: NEGATIVE
PROTEIN: 100 mg/dL — AB
SPECIFIC GRAVITY, URINE: 1.025 (ref 1.005–1.030)
pH: 5.5 (ref 5.0–8.0)

## 2015-12-06 LAB — BASIC METABOLIC PANEL WITH GFR
Anion gap: 19 — ABNORMAL HIGH (ref 5–15)
BUN: 11 mg/dL (ref 6–20)
CO2: 11 mmol/L — ABNORMAL LOW (ref 22–32)
Calcium: 7.7 mg/dL — ABNORMAL LOW (ref 8.9–10.3)
Chloride: 105 mmol/L (ref 101–111)
Creatinine, Ser: 0.98 mg/dL (ref 0.44–1.00)
GFR calc Af Amer: >60 mL/min
GFR calc non Af Amer: >60 mL/min
Glucose, Bld: 57 mg/dL — ABNORMAL LOW (ref 65–99)
Potassium: 4.3 mmol/L (ref 3.5–5.1)
Sodium: 135 mmol/L (ref 135–145)

## 2015-12-06 LAB — BASIC METABOLIC PANEL
ANION GAP: 12 (ref 5–15)
BUN: 10 mg/dL (ref 6–20)
CO2: 13 mmol/L — ABNORMAL LOW (ref 22–32)
Calcium: 8 mg/dL — ABNORMAL LOW (ref 8.9–10.3)
Chloride: 109 mmol/L (ref 101–111)
Creatinine, Ser: 0.84 mg/dL (ref 0.44–1.00)
GFR calc Af Amer: >60 mL/min (ref >60)
Glucose, Bld: 79 mg/dL (ref 65–99)
POTASSIUM: 3.6 mmol/L (ref 3.5–5.1)
SODIUM: 134 mmol/L — AB (ref 135–145)

## 2015-12-06 LAB — COMPREHENSIVE METABOLIC PANEL
ALK PHOS: 88 U/L (ref 38–126)
ALT: 22 U/L (ref 14–54)
AST: 39 U/L (ref 15–41)
Albumin: 4.2 g/dL (ref 3.5–5.0)
Anion gap: 22 — ABNORMAL HIGH (ref 5–15)
BILIRUBIN TOTAL: 1.1 mg/dL (ref 0.3–1.2)
BUN: 12 mg/dL (ref 6–20)
CALCIUM: 8.2 mg/dL — AB (ref 8.9–10.3)
CO2: 11 mmol/L — ABNORMAL LOW (ref 22–32)
CREATININE: 0.86 mg/dL (ref 0.44–1.00)
Chloride: 102 mmol/L (ref 101–111)
Glucose, Bld: 69 mg/dL (ref 65–99)
Potassium: 3.8 mmol/L (ref 3.5–5.1)
Sodium: 135 mmol/L (ref 135–145)
TOTAL PROTEIN: 7.2 g/dL (ref 6.5–8.1)

## 2015-12-06 LAB — CBC
HCT: 32.5 % — ABNORMAL LOW (ref 36.0–46.0)
Hemoglobin: 10.8 g/dL — ABNORMAL LOW (ref 12.0–15.0)
MCH: 31.6 pg (ref 26.0–34.0)
MCHC: 33.2 g/dL (ref 30.0–36.0)
MCV: 95 fL (ref 78.0–100.0)
Platelets: 272 10*3/uL (ref 150–400)
RBC: 3.42 MIL/uL — AB (ref 3.87–5.11)
RDW: 15.3 % (ref 11.5–15.5)
WBC: 3.1 10*3/uL — AB (ref 4.0–10.5)

## 2015-12-06 LAB — BLOOD GAS, VENOUS
Acid-base deficit: 17.9 mmol/L — ABNORMAL HIGH (ref 0.0–2.0)
Bicarbonate: 9.4 meq/L — ABNORMAL LOW (ref 20.0–24.0)
Drawn by: 103701
O2 Saturation: 70.4 %
Patient temperature: 98.6
TCO2: 9.3 mmol/L (ref 0–100)
pCO2, Ven: 27.4 mmHg — ABNORMAL LOW (ref 45.0–50.0)
pH, Ven: 7.16 — CL (ref 7.250–7.300)
pO2, Ven: 42.4 mmHg (ref 31.0–45.0)

## 2015-12-06 LAB — ETHANOL: Alcohol, Ethyl (B): <5 mg/dL (ref <5)

## 2015-12-06 LAB — LACTIC ACID, PLASMA
LACTIC ACID, VENOUS: 0.6 mmol/L (ref 0.5–1.9)
Lactic Acid, Venous: 0.7 mmol/L (ref 0.5–1.9)

## 2015-12-06 LAB — URINE MICROSCOPIC-ADD ON: Bacteria, UA: NONE SEEN

## 2015-12-06 LAB — GLUCOSE, CAPILLARY: GLUCOSE-CAPILLARY: 97 mg/dL (ref 65–99)

## 2015-12-06 LAB — RAPID URINE DRUG SCREEN, HOSP PERFORMED
Amphetamines: NOT DETECTED
Barbiturates: NOT DETECTED
Benzodiazepines: NOT DETECTED
Cocaine: NOT DETECTED
Opiates: POSITIVE — AB
Tetrahydrocannabinol: NOT DETECTED

## 2015-12-06 LAB — CBG MONITORING, ED: Glucose-Capillary: 68 mg/dL (ref 65–99)

## 2015-12-06 LAB — LIPASE, BLOOD: LIPASE: 33 U/L (ref 11–51)

## 2015-12-06 LAB — ACETAMINOPHEN LEVEL: Acetaminophen (Tylenol), Serum: <10 ug/mL — ABNORMAL LOW (ref 10–30)

## 2015-12-06 LAB — MRSA PCR SCREENING: MRSA BY PCR: NEGATIVE

## 2015-12-06 LAB — SALICYLATE LEVEL: Salicylate Lvl: <4 mg/dL (ref 2.8–30.0)

## 2015-12-06 LAB — TROPONIN I

## 2015-12-06 LAB — OSMOLALITY: OSMOLALITY: 291 mosm/kg (ref 275–295)

## 2015-12-06 LAB — CK: Total CK: 68 U/L (ref 38–234)

## 2015-12-06 MED ORDER — METOCLOPRAMIDE HCL 10 MG PO TABS
5.0000 mg | ORAL_TABLET | Freq: Once | ORAL | Status: AC
  Administered 2015-12-06: 5 mg via ORAL
  Filled 2015-12-06: qty 1

## 2015-12-06 MED ORDER — CARVEDILOL 25 MG PO TABS
25.0000 mg | ORAL_TABLET | Freq: Two times a day (BID) | ORAL | Status: DC
  Administered 2015-12-06 – 2015-12-07 (×2): 25 mg via ORAL
  Filled 2015-12-06: qty 2
  Filled 2015-12-06: qty 1

## 2015-12-06 MED ORDER — THIAMINE HCL 100 MG/ML IJ SOLN
100.0000 mg | Freq: Every day | INTRAMUSCULAR | Status: DC
  Filled 2015-12-06: qty 2

## 2015-12-06 MED ORDER — LORAZEPAM 1 MG PO TABS: 1.0000 mg | ORAL_TABLET | Freq: Four times a day (QID) | ORAL | Status: DC | PRN

## 2015-12-06 MED ORDER — FOLIC ACID 1 MG PO TABS
1.0000 mg | ORAL_TABLET | Freq: Every day | ORAL | Status: DC
  Administered 2015-12-06 – 2015-12-07 (×2): 1 mg via ORAL
  Filled 2015-12-06 (×2): qty 1

## 2015-12-06 MED ORDER — DIATRIZOATE MEGLUMINE & SODIUM 66-10 % PO SOLN
30.0000 mL | Freq: Once | ORAL | Status: AC
  Administered 2015-12-06: 30 mL via ORAL

## 2015-12-06 MED ORDER — SODIUM CHLORIDE 0.9% FLUSH: 3.0000 mL | Freq: Two times a day (BID) | INTRAVENOUS | Status: DC

## 2015-12-06 MED ORDER — HYDROMORPHONE HCL 1 MG/ML IJ SOLN
0.5000 mg | Freq: Once | INTRAMUSCULAR | Status: AC
  Administered 2015-12-06: 0.5 mg via INTRAVENOUS
  Filled 2015-12-06: qty 1

## 2015-12-06 MED ORDER — LORATADINE 10 MG PO TABS
10.0000 mg | ORAL_TABLET | Freq: Every day | ORAL | Status: DC
  Administered 2015-12-06 – 2015-12-07 (×2): 10 mg via ORAL
  Filled 2015-12-06 (×2): qty 1

## 2015-12-06 MED ORDER — METOCLOPRAMIDE HCL 5 MG/ML IJ SOLN
5.0000 mg | Freq: Once | INTRAMUSCULAR | Status: DC
  Filled 2015-12-06: qty 2

## 2015-12-06 MED ORDER — VITAMIN B-1 100 MG PO TABS
100.0000 mg | ORAL_TABLET | Freq: Every day | ORAL | Status: DC
  Administered 2015-12-07: 100 mg via ORAL
  Filled 2015-12-06: qty 1

## 2015-12-06 MED ORDER — POLYVINYL ALCOHOL 1.4 % OP SOLN
1.0000 [drp] | Freq: Every day | OPHTHALMIC | Status: DC | PRN
  Filled 2015-12-06: qty 15

## 2015-12-06 MED ORDER — LORAZEPAM 1 MG PO TABS: 0.0000 mg | ORAL_TABLET | Freq: Two times a day (BID) | ORAL | Status: DC

## 2015-12-06 MED ORDER — ENOXAPARIN SODIUM 40 MG/0.4ML ~~LOC~~ SOLN
40.0000 mg | SUBCUTANEOUS | Status: DC
  Administered 2015-12-06: 40 mg via SUBCUTANEOUS
  Filled 2015-12-06: qty 0.4

## 2015-12-06 MED ORDER — THIAMINE HCL 100 MG/ML IJ SOLN
Freq: Once | INTRAVENOUS | Status: AC
  Administered 2015-12-06: 10:00:00 via INTRAVENOUS
  Filled 2015-12-06: qty 1000

## 2015-12-06 MED ORDER — SODIUM CHLORIDE 0.9 % IV BOLUS (SEPSIS)
500.0000 mL | Freq: Once | INTRAVENOUS | Status: AC
  Administered 2015-12-06: 500 mL via INTRAVENOUS

## 2015-12-06 MED ORDER — VILAZODONE HCL 40 MG PO TABS: 40.0000 mg | ORAL_TABLET | Freq: Every day | ORAL | Status: DC

## 2015-12-06 MED ORDER — CYANOCOBALAMIN 500 MCG PO TABS
500.0000 ug | ORAL_TABLET | Freq: Every day | ORAL | Status: DC
  Administered 2015-12-06 – 2015-12-07 (×2): 500 ug via ORAL
  Filled 2015-12-06 (×2): qty 1

## 2015-12-06 MED ORDER — ADULT MULTIVITAMIN W/MINERALS CH
1.0000 | ORAL_TABLET | Freq: Every day | ORAL | Status: DC
  Administered 2015-12-06 – 2015-12-07 (×2): 1 via ORAL
  Filled 2015-12-06 (×2): qty 1

## 2015-12-06 MED ORDER — OXYCODONE HCL 5 MG PO TABS
5.0000 mg | ORAL_TABLET | ORAL | Status: DC | PRN
  Administered 2015-12-06 (×2): 5 mg via ORAL
  Filled 2015-12-06 (×2): qty 1

## 2015-12-06 MED ORDER — LORAZEPAM 2 MG/ML IJ SOLN: 1.0000 mg | Freq: Four times a day (QID) | INTRAMUSCULAR | Status: DC | PRN

## 2015-12-06 MED ORDER — VILAZODONE HCL 20 MG PO TABS: 40.0000 mg | ORAL_TABLET | Freq: Every day | ORAL | Status: DC

## 2015-12-06 MED ORDER — VILAZODONE HCL 20 MG PO TABS
40.0000 mg | ORAL_TABLET | Freq: Every day | ORAL | Status: DC
  Administered 2015-12-06: 40 mg via ORAL
  Filled 2015-12-06 (×2): qty 2

## 2015-12-06 MED ORDER — HYPROMELLOSE (GONIOSCOPIC) 2.5 % OP SOLN: 1.0000 [drp] | Freq: Every day | OPHTHALMIC | Status: DC | PRN

## 2015-12-06 MED ORDER — GABAPENTIN 300 MG PO CAPS
600.0000 mg | ORAL_CAPSULE | Freq: Every day | ORAL | Status: DC
  Administered 2015-12-06: 600 mg via ORAL
  Filled 2015-12-06: qty 2

## 2015-12-06 MED ORDER — SODIUM CHLORIDE 0.9 % IV BOLUS (SEPSIS)
1000.0000 mL | Freq: Once | INTRAVENOUS | Status: AC
  Administered 2015-12-06: 1000 mL via INTRAVENOUS

## 2015-12-06 MED ORDER — OXYCODONE-ACETAMINOPHEN 7.5-325 MG PO TABS
1.0000 | ORAL_TABLET | ORAL | Status: DC | PRN
  Administered 2015-12-06 – 2015-12-07 (×3): 1 via ORAL
  Filled 2015-12-06 (×3): qty 1

## 2015-12-06 MED ORDER — MIRABEGRON ER 50 MG PO TB24
50.0000 mg | ORAL_TABLET | Freq: Every day | ORAL | Status: DC
  Administered 2015-12-06 – 2015-12-07 (×2): 50 mg via ORAL
  Filled 2015-12-06 (×2): qty 1

## 2015-12-06 MED ORDER — VITAMIN D3 25 MCG (1000 UNIT) PO TABS
2000.0000 [IU] | ORAL_TABLET | Freq: Every day | ORAL | Status: DC
  Administered 2015-12-06 – 2015-12-07 (×2): 2000 [IU] via ORAL
  Filled 2015-12-06 (×2): qty 2

## 2015-12-06 MED ORDER — ASPIRIN 81 MG PO CHEW
81.0000 mg | CHEWABLE_TABLET | Freq: Every day | ORAL | Status: DC
  Administered 2015-12-06 – 2015-12-07 (×2): 81 mg via ORAL
  Filled 2015-12-06 (×2): qty 1

## 2015-12-06 MED ORDER — PROMETHAZINE HCL 25 MG/ML IJ SOLN
12.5000 mg | Freq: Once | INTRAMUSCULAR | Status: AC
  Administered 2015-12-06: 12.5 mg via INTRAVENOUS
  Filled 2015-12-06: qty 1

## 2015-12-06 MED ORDER — GABAPENTIN 600 MG PO TABS
600.0000 mg | ORAL_TABLET | Freq: Every day | ORAL | Status: DC
  Filled 2015-12-06: qty 1

## 2015-12-06 MED ORDER — METOCLOPRAMIDE HCL 5 MG/ML IJ SOLN
10.0000 mg | Freq: Once | INTRAMUSCULAR | Status: AC
  Administered 2015-12-06: 10 mg via INTRAVENOUS
  Filled 2015-12-06: qty 2

## 2015-12-06 MED ORDER — LORAZEPAM 1 MG PO TABS: 0.0000 mg | ORAL_TABLET | Freq: Four times a day (QID) | ORAL | Status: DC

## 2015-12-06 NOTE — ED Notes (Signed)
Pt given [place] sandwich, apple sauce, saline crackers, and orange juice.

## 2015-12-06 NOTE — ED Provider Notes (Signed)
[facility] DEPT Provider Note   CSN: [id number] Arrival date & time: 12/06/15  [id number]  First Provider Contact:  First MD Initiated Contact with Patient 12/06/15 0133   By signing my name below, I, Georgette Shell, attest that this documentation has been prepared under the direction and in the presence of Charlann Lange, PA-C. Electronically Signed: Georgette Shell, ED Scribe. 12/06/15. 1:51 AM.  History   Chief Complaint Chief Complaint  Patient presents with  . Abdominal Pain    HPI Comments: [name] is a 55 y.o. female who presents to the Emergency Department complaining of sudden onset, constant, 10/10 abdominal pain onset 5pm one day ago. Pt also has associated vomiting (x3 epsodes). Pt also had heart palpitations and took Carvedilol 2 25mg  with mild relief to the palpations. Pt is unsure of what her heart rate was and she was unable to check her blood pressure PTA.  Pt also complains of right arm pain s/p fall on 12/04/2015. Pt was seen in the ED for her pain and diagnosed with a distal radial fracture has a fiberglass splint in place. Pt saw her PCP and was prescribed hydrocodone for the pain. She notes the hydrocodone does not help the pain. No other alleviating factors noted. Pt denies fever. She has no other complaints at this time.   The history is provided by the patient.    Past Medical History:  Diagnosis Date  . Allergy    SEASONAL  . Anemia   . Anxiety   . Arthritis   . Cardiomyopathy   . Deaf   . Depression   . Fibromyalgia   . Gastritis   . GERD (gastroesophageal reflux disease)   . Hearing impairment   . Hypertension    [name], take htn medication to regulate heart beat.  . Migraine   . Osteoporosis   . Pulmonary embolism ([place])   . Sleep apnea   . Vitamin D deficiency     Patient Active Problem List   Diagnosis Date Noted  . Accidental drug overdose 10/26/2015  . Aspiration pneumonia ([place]) 10/23/2015  . Acute respiratory failure with hypoxia ([place])  10/23/2015  . Acute kidney injury ([place]) 10/23/2015  . Transaminitis 10/23/2015  . Positive blood culture 10/23/2015  . Elevated troponin 10/23/2015  . Thrombocytopenia ([place]) 10/23/2015  . Normocytic anemia 10/23/2015  . Shock ([place]) 10/19/2015  . Major depressive disorder, recurrent episode, moderate ([place]) 09/18/2014  . Non-traumatic compression fracture of vertebral column with routine healing 07/17/2014  . Osteoporosis 07/17/2014  . H/O vitamin D deficiency 07/17/2014  . Odynophagia 07/09/2014  . Dysphagia 07/08/2014  . Chest pain 05/20/2014  . Nausea & vomiting 05/20/2014  . 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 Clinical depression 03/29/2014  . Breath shortness 03/29/2014  . Hypercholesterolemia without hypertriglyceridemia 03/29/2014  . Fatigue 03/29/2014  . Classical migraine with intractable migraine 03/29/2014  . Disorder of mitral valve 03/29/2014  . Atypical migraine 03/29/2014  . Atrial septal defect of fossa ovalis 03/29/2014  . HLD (hyperlipidemia) 03/29/2014  . Arthralgia of multiple joints 03/29/2014  . Anemia, iron deficiency 03/29/2014  . Fibrositis 03/29/2014  . Abdominal pain 12/20/2013  . Absolute anemia 09/20/2013  . Bronchitis 09/20/2013  . Vaginitis and vulvovaginitis 09/20/2013  . Family history of breast cancer 07/13/2013  . Palpitation 07/02/2013  . History of mitral valve repair 07/02/2013  . Status post patch closure of ASD 07/02/2013  . Hyperlipidemia 07/02/2013  . OAB (overactive bladder) 06/13/2013  . Ovarian cyst, left  03/15/2013  . Vaginal lesion 03/15/2013  . Ovarian mass 03/15/2013  . Maternal DVT (deep vein thrombosis), history of 02/26/2013  . PMB (postmenopausal bleeding) 02/26/2013  . Alcohol dependence ([place]) 11/07/2012  . Unspecified vitamin D deficiency 08/17/2012  . L1 vertebral fracture ([place]) 08/01/2012  . Spinal compression fracture ([place]) 07/12/2012  . Menopausal state 07/12/2012  .  Psoriasis 07/12/2012  . Major depressive disorder, recurrent episode, severe, without mention of psychotic behavior 12/07/2011  . Generalized anxiety disorder 12/07/2011  . Cardiomyopathy 03/16/2011  . Hearing impairment   . Migraine   . Arthritis     Past Surgical History:  Procedure Laterality Date  . APPENDECTOMY    . CARDIAC SURGERY  January 2007   mitral valve repair  . CARDIAC SURGERY  1973   atrial septal defect  . CARDIAC SURGERY    . COCHLEAR IMPLANT    . INNER EAR SURGERY     TUBES  . LAPAROSCOPIC CHOLECYSTECTOMY  2012  . TONSILLECTOMY AND ADENOIDECTOMY    . TUBAL LIGATION      OB History    Gravida Para Term Preterm AB Living   2 2 2  0 0     SAB TAB Ectopic Multiple Live Births   0 0 0           Home Medications    Prior to Admission medications   Medication Sig Start Date End Date Taking? Authorizing Provider  aspirin 81 MG chewable tablet Chew 81 mg by mouth daily.    Historical Provider, MD  carvedilol (COREG) 25 MG tablet TAKE 1 TABLET BY MOUTH TWICE A DAY WITH MEALS (APPOINTMENT NEEDED FOR ADDITIONAL REFILLS) 10/17/15   Jerline Pain, MD  Cholecalciferol (VITAMIN D3) 2000 units TABS Take 1 tablet by mouth daily.    Historical Provider, MD  cyclobenzaprine (FLEXERIL) 10 MG tablet Take 1 tablet (10 mg total) by mouth 2 (two) times daily as needed for muscle spasms. Patient not taking: Reported on 11/08/2015 09/25/15   Gareth Morgan, MD  dicyclomine (BENTYL) 20 MG tablet Take 1 tablet (20 mg total) by mouth 2 (two) times daily. 11/08/15   [name], MD  diphenoxylate-atropine (LOMOTIL) 2.5-0.025 MG tablet Take 1 tablet by mouth 4 (four) times daily as needed for diarrhea or loose stools. 11/08/15   [name], MD  folic acid (FOLVITE) 1 MG tablet Take 1 mg by mouth daily.    Historical Provider, MD  gabapentin (NEURONTIN) 600 MG tablet Take 600 mg by mouth at bedtime.     Historical Provider, MD  hydrochlorothiazide (MICROZIDE) 12.5 MG capsule Take 1 capsule  (12.5 mg total) by mouth every morning. 05/16/14   Jerline Pain, MD  HYDROcodone-acetaminophen (NORCO) 5-325 MG tablet Take 1 tablet by mouth every 6 (six) hours as needed for severe pain. Patient not taking: Reported on 09/25/2015 09/16/15   Mercedes Camprubi-Soms, PA-C  HYDROcodone-acetaminophen (NORCO/VICODIN) 5-325 MG tablet Take 1 tablet by mouth every 4 (four) hours as needed for moderate pain or severe pain. Patient not taking: Reported on 09/16/2015 07/17/15   [place] Bibles, PA-C  HYDROcodone-acetaminophen (NORCO/VICODIN) 5-325 MG tablet Take 1-2 tablets by mouth every 4 (four) hours as needed. 12/05/15   Delos Haring, PA-C  hydroxypropyl methylcellulose / hypromellose (ISOPTO TEARS / GONIOVISC) 2.5 % ophthalmic solution Place 1 drop into both eyes daily as needed for dry eyes.    Historical Provider, MD  mirabegron ER (MYRBETRIQ) 50 MG TB24 tablet Take 1 tablet (50 mg total) by mouth daily. 07/17/14  Terrance Mass, MD  Multiple Vitamin (MULTIVITAMIN WITH MINERALS) TABS tablet Take 1 tablet by mouth daily.    Historical Provider, MD  Omega-3 Fatty Acids (FISH OIL) 1000 MG CAPS Take 1 capsule by mouth daily.    Historical Provider, MD  ondansetron (ZOFRAN ODT) 4 MG disintegrating tablet Take 1 tablet (4 mg total) by mouth every 8 (eight) hours as needed for nausea. 11/08/15   [name], MD  oxyCODONE (ROXICODONE) 5 MG immediate release tablet Take 1 tablet (5 mg total) by mouth every 4 (four) hours as needed for severe pain. Patient not taking: Reported on 11/08/2015 10/06/15   Deno Etienne, DO  oxymetazoline (AFRIN) 0.05 % nasal spray Place 1 spray into both nostrils 2 (two) times daily as needed for congestion.    Historical Provider, MD  pantoprazole (PROTONIX) 40 MG tablet TAKE 1 TABLET (40 MG TOTAL) BY MOUTH DAILY. 03/29/15   Davonna Belling, MD  penicillin v potassium (VEETID) 500 MG tablet Take 1 tablet (500 mg total) by mouth 4 (four) times daily. Patient not taking: Reported on 09/16/2015 07/17/15    [place] Bibles, PA-C  ranitidine (ZANTAC) 150 MG tablet Take 1 tablet (150 mg total) by mouth 2 (two) times daily. Patient not taking: Reported on 09/25/2015 09/16/15   Mercedes Camprubi-Soms, PA-C  trolamine salicylate (ASPERCREME) 10 % cream Apply 1 application topically daily as needed for muscle pain.    Historical Provider, MD  Vilazodone HCl (VIIBRYD) 40 MG TABS Take 40 mg by mouth daily.    Historical Provider, MD  vitamin B-12 (CYANOCOBALAMIN) 500 MCG tablet Take 500 mcg by mouth daily.    Historical Provider, MD    Family History Family History  Problem Relation Age of Onset  . Breast cancer Mother     bilateral; ages 36 and 75; TAH/BSO ~50  . Depression Sister   . Heart disease Father   . Hypertension Father   . Colon cancer Neg Hx   . Esophageal cancer Neg Hx   . Pancreatic cancer Neg Hx   . Rectal cancer Neg Hx   . Stomach cancer Neg Hx     Social History Social History  Substance Use Topics  . Smoking status: Never Smoker  . Smokeless tobacco: Never Used  . Alcohol use 0.0 oz/week     Allergies   Depakote [divalproex sodium]; Valium; Diazepam; and Tizanidine hcl   Review of Systems Review of Systems  Constitutional: Negative for fever.  Cardiovascular: Positive for palpitations.  Gastrointestinal: Positive for abdominal pain and vomiting.  Musculoskeletal: Positive for arthralgias.     Physical Exam Updated Vital Signs BP 97/65 (BP Location: Left Arm)   Pulse 76   Temp 97.9 F (36.6 C) (Oral)   SpO2 98%   Physical Exam  Constitutional: She appears well-developed and well-nourished.  HENT:  Head: Normocephalic.  Eyes: Conjunctivae are normal.  Cardiovascular: Normal rate.   Pulmonary/Chest: Effort normal. No respiratory distress.  Abdominal: She exhibits no distension.  Musculoskeletal: Normal range of motion.  Neurological: She is alert.  Skin: Skin is warm and dry.  Psychiatric: She has a normal mood and affect. Her behavior is normal.  Nursing  note and vitals reviewed.    ED Treatments / Results  DIAGNOSTIC STUDIES: Oxygen Saturation is 98% on RA, normal by my interpretation.    COORDINATION OF CARE: 1:46 AM Discussed treatment plan with pt at bedside and pt agreed to plan.  Labs (all labs ordered are listed, but only abnormal results are displayed)  Labs Reviewed  LIPASE, BLOOD  COMPREHENSIVE METABOLIC PANEL  CBC  URINALYSIS, ROUTINE W REFLEX MICROSCOPIC (NOT AT [facility])    EKG  EKG Interpretation  Date/Time:  Saturday December 06 2015 01:05:44 EDT Ventricular Rate:  73 PR Interval:    QRS Duration: 89 QT Interval:  518 QTC Calculation: 571 R Axis:   49 Text Interpretation:  Sinus rhythm Prolonged QT interval Confirmed by [facility]  MD, APRIL ([id number]) on 12/06/2015 1:44:01 AM       Radiology Dg Forearm Right  Result Date: 12/04/2015 CLINICAL DATA:  Fall today with right distal forearm and wrist pain with swelling and bruising. EXAM: RIGHT FOREARM - 2 VIEW COMPARISON:  None. FINDINGS: Comminuted fracture the distal radial metaphysis extends to the articular surface apex anterior angulation noted at fracture site. No associated ulnar fracture is identified. IMPRESSION: Comminuted distal radius fracture with intra-articular extension. Electronically Signed   By: Misty Stanley M.D.   On: 12/04/2015 19:16  Dg Wrist Complete Right  Result Date: 12/04/2015 CLINICAL DATA:  Fall earlier today with wrist pain and swelling. EXAM: RIGHT WRIST - COMPLETE 3+ VIEW COMPARISON:  None. FINDINGS: Comminuted fracture of the distal radius identified with intra-articular extension. No associated fracture the distal ulna. Carpal alignment is anatomic. There are some degenerative changes at the first carpometacarpal joint and also at the first MCP joint. IMPRESSION: Comminuted distal radius fracture with intra-articular extension. Electronically Signed   By: Misty Stanley M.D.   On: 12/04/2015 19:17   Procedures Procedures (including  critical care time)  Medications Ordered in ED Medications  metoCLOPramide (REGLAN) injection 10 mg (10 mg Intravenous Given 12/06/15 0152)     Initial Impression / Assessment and Plan / ED Course  I have reviewed the triage vital signs and the nursing notes.  Pertinent labs & imaging results that were available during my care of the patient were reviewed by me and considered in my medical decision making (see chart for details).  Clinical Course  Comment By Time  On re-evaluation, patient endorses pain in arm and nausea are returning. Abdomen is mildly TTP in epigastric region. No peritoneal signs. Roxanna Mew, [place] 07/29 0840  Spoke with critical care, appreciate their time and input. Agree to see patient.  Roxanna Mew, [place] 07/29 1007  Critical care saw patient, patient does not need ICU.  Roxanna Mew, [place] 07/29 1102    Patient presents with abdominal pain, nausea and vomiting that started yesterday around 5:00 pm. She was started on hydrocodone for pain from a recently fractured wrist and was concerned the medication caused her symptoms. No fever or diarrhea. She doesn't remember her last bowel movement. She is not passing gas. History of cholecystectomy and appendectomy. No history of obstruction  She is found to be acidotic with a CO2 of 11 and anion gap of 22. Aggressive hydration is started. Pain and nausea and treated with medications. Zofran avoided secondary to prolonged QT on EKG.   Patient care signed out to Gay Filler, PA-C, for follow up on pending labs and recheck after hydration.  Final Clinical Impressions(s) / ED Diagnoses   Final diagnoses:  None   Abdominal pain Acidosis  New Prescriptions New Prescriptions   No medications on file  I personally performed the services described in this documentation, which was scribed in my presence. The recorded information has been reviewed and is accurate.      Charlann Lange, PA-C 12/08/15  0425    April Palumbo, MD 12/09/15  2312  

## 2015-12-06 NOTE — ED Provider Notes (Signed)
Assumed care from [facility]. [name] is a 55 y.o. chronically ill female with multiple medical conditions presents to ED with complaint of sudden, constant, [id number], periumbilical pain with associated N/V. She recently had a fall with distal radial fracture and started on hydrocodone - thought could be related to narcotics; however, pt not narcotic dependent. H/o cholecystectomy and appendectomy. Afebrile, soft BPs, not tachycardic/tachypneic. Diffusely tender on abdominal exam. IVF and reglan given. Acute abdomen and chest negative - low suspicion for obstruction or perforation. CT shows diverticulosis w/o diverticulitis. Troponin negative. CK normal. U/A shows ketones and protein, no evidence of UTI - IV hydration. CMP remarkable for AG. Will re-check BMET to assess AG. Blood pressures were transiently low in 80s; however, have since improved to 110s/60s. Pending improvement in AG can d/c home. If no improvement consider admission for unknown etiology of AG.   On repeat BMP AG 19, bicarcb remains persistently low at 11. Blood pressures remain soft. Patient endorse chronic alcohol use, quantity of use is varied. She states sometimes she drinks for a week straight, while other times she won't drink for a few weeks. She states she drinks 1/2-1pint vodka in a two day timespan. Last drink was approximately 2 days ago. On re-evaluation, patient reports increasing pain level in right arm and nausea. Abdomen is mildly TTP in epigastric, no peritoneal signs. Patient is eating without emesis. Anti-emetic and pain medication re-ordered. Unclear source of metabolic acidosis, will check VBG, salicylate, acetaminophen, ethanol, and lactic acid. Banana bag ordered.   VBG critical with pH 7.1. Lactic acid negative. Ethanol, acetaminophen, and salicylate normal. Given critical pH, low bicarb, and unknown source of AG will consult critical care for further evaluation and management.    10:13 AM: Spoke with critical  care, appreciate their time and input, agree to see patient.   11:02 AM: Critical care saw patient, patient does not need ICU. Will consult hospitalist for admission.   11:20 AM: Spoke with Dr. Lonny Prude, agree to admit patient for further evaluation of AG metabolic acidosis.    Roxanna Mew, [place] 12/06/15 1120    Veatrice Kells, MD 12/09/15 2312

## 2015-12-06 NOTE — Progress Notes (Signed)
Pt was sitting up and awake in bed when [facility] arrived; her mother was bedside. She told me of how she broke her arm and is in the hospital because she was having problems w/her heart. Pt asked for prayer as she waits on her diagnosis. Pt and mother were appreciative of visit and prayer. Please page if additional support is needed.  Chaplain Ernest Haber, M.Div.   12/06/15 1500  Clinical Encounter Type  Visited With Patient and family together

## 2015-12-06 NOTE — ED Notes (Signed)
Patient transported to X-ray 

## 2015-12-06 NOTE — ED Notes (Signed)
Gave patient another sandwich, ginger ale, and stick of cheese to eat.

## 2015-12-06 NOTE — ED Triage Notes (Signed)
Pt is hearing impaired, reads lips.  Pt c/o abd pain, emesis x 3 since 1700 last night.  Pt  States she felt her heart racing earlier last night so she took Carvedilol 25mg  x 2, pt unsure of what rate was and was unable to check BP at home.  Pt c/o R arm pain, pt has new fiberglass splint in place, s/p fall with distal radial fx 7/27. Pt states hydrocodone is not controlling pain.

## 2015-12-06 NOTE — Consult Note (Signed)
Name: [name] MRN: [id number] DOB: September 02, 1960    ADMISSION DATE:  12/06/2015 CONSULTATION DATE:  12/06/2015  REFERRING MD :  Gay Filler, PA-C / EDP  CHIEF COMPLAINT:  Metabolic Acidosis  SIGNIFICANT EVENTS  7/29 - Presented to ED  STUDIES:  R Forearm/Wrist X-ray 7/27:  Comminuted distal radius fracture with intra-articular extension. Port CXR 7/29: Silhouetting of bilateral hemidiaphragms suggestive of atelectasis. Abd X-ray 7/29: No air-fluid levels or evidence of bowel obstruction. Right upper quadrant cholecystectomy clips noted. CT Abd/Pelvis W/O 7/29: No acute intra-abdominal pelvic pathology. Diverticulosis noted. Fatty liver noted. No portal venous gas. Fat-containing umbilical hernia. Small hiatal hernia. No obvious obstruction. No free air or free fluid.  HISTORY OF PRESENT ILLNESS:  Per documentation patient presented to the emergency department complaining of sudden onset constant abdominal pain at approximately 5 PM the day previous. She called fighters pain as 10/10. Patient also reported heart palpitations and took 50 mg of Coreg with mild relief and palpitations. Patient was also complaining of pain in her right arm status post fall on 7/27 subsequently being diagnosed with a radial fracture with splint in place. Patient was prescribed hydrocodone for pain. Reported to the emergency department physician that hydrocodone did not seem to help the pain. At the time of presentation she denied any fever. Patient was subsequently found to have an anion gap metabolic acidosis and PCCM was consulted for medical assessment. The patient reports to me that she had spontaneous palpitations yesterday evening starting while watching TV. This was subsequently followed by nausea and vomiting of approximately 2 cups of brown liquid emesis. Denies any hematemesis. Reports upper abdominal discomfort. Patient reports she has had decreased oral intake of late due to nausea with stomach  discomfort. Reports she is primarily been drinking green tea. Reports decreased oral intake due to worsening of symptoms with oral intake. Denies any diarrhea, melena, or hematochezia. Denies any dysuria or hematuria. Denies any subjective fever, chills, or sweats. Denies any dyspnea or cough. Denies any chest pain or pressure.  PAST MEDICAL HISTORY :  Past Medical History:  Diagnosis Date  . Allergy    SEASONAL  . Anemia   . Anxiety   . Arthritis   . Cardiomyopathy   . Deaf   . Depression   . Fibromyalgia   . Gastritis   . GERD (gastroesophageal reflux disease)   . Hearing impairment   . Hypertension    [name], take htn medication to regulate heart beat.  . Migraine   . Osteoporosis   . Pulmonary embolism ([place])    occured post c-section of her daughter  . Sleep apnea   . Vitamin D deficiency     PAST SURGICAL HISTORY: Past Surgical History:  Procedure Laterality Date  . APPENDECTOMY    . CARDIAC SURGERY  January 2007   mitral valve repair  . CARDIAC SURGERY  1973   atrial septal defect  . CARDIAC SURGERY    . COCHLEAR IMPLANT    . INNER EAR SURGERY     TUBES  . LAPAROSCOPIC CHOLECYSTECTOMY  2012  . TONSILLECTOMY AND ADENOIDECTOMY    . TUBAL LIGATION      Prior to Admission medications   Medication Sig Start Date End Date Taking? Authorizing Provider  aspirin 81 MG chewable tablet Chew 81 mg by mouth daily.   Yes Historical Provider, MD  carvedilol (COREG) 25 MG tablet TAKE 1 TABLET BY MOUTH TWICE A DAY WITH MEALS (APPOINTMENT NEEDED FOR ADDITIONAL REFILLS) 10/17/15  Yes Jerline Pain, MD  Cholecalciferol (VITAMIN D3) 2000 units TABS Take 1 tablet by mouth daily.   Yes Historical Provider, MD  diphenoxylate-atropine (LOMOTIL) 2.5-0.025 MG tablet Take 1 tablet by mouth 4 (four) times daily as needed for diarrhea or loose stools. 11/08/15  Yes [name], MD  fexofenadine (ALLEGRA) 180 MG tablet Take 180 mg by mouth daily.   Yes Historical Provider, MD  folic acid  (FOLVITE) 1 MG tablet Take 1 mg by mouth daily.   Yes Historical Provider, MD  gabapentin (NEURONTIN) 600 MG tablet Take 600 mg by mouth at bedtime.    Yes Historical Provider, MD  hydrochlorothiazide (MICROZIDE) 12.5 MG capsule Take 1 capsule (12.5 mg total) by mouth every morning. 05/16/14  Yes Jerline Pain, MD  HYDROcodone-acetaminophen (NORCO/VICODIN) 5-325 MG tablet Take 1-2 tablets by mouth every 4 (four) hours as needed. Patient taking differently: Take 1-2 tablets by mouth every 4 (four) hours as needed (for pain.).  12/05/15  Yes Tiffany Carlota Raspberry, PA-C  hydroxypropyl methylcellulose / hypromellose (ISOPTO TEARS / GONIOVISC) 2.5 % ophthalmic solution Place 1 drop into both eyes daily as needed for dry eyes.   Yes Historical Provider, MD  mirabegron ER (MYRBETRIQ) 50 MG TB24 tablet Take 1 tablet (50 mg total) by mouth daily. 07/17/14  Yes Terrance Mass, MD  Multiple Vitamin (MULTIVITAMIN WITH MINERALS) TABS tablet Take 1 tablet by mouth daily.   Yes Historical Provider, MD  Omega-3 Fatty Acids (FISH OIL) 1000 MG CAPS Take 1,000 mg by mouth daily.    Yes Historical Provider, MD  ondansetron (ZOFRAN ODT) 4 MG disintegrating tablet Take 1 tablet (4 mg total) by mouth every 8 (eight) hours as needed for nausea. 11/08/15  Yes [name], MD  pantoprazole (PROTONIX) 40 MG tablet TAKE 1 TABLET (40 MG TOTAL) BY MOUTH DAILY. 03/29/15  Yes Davonna Belling, MD  prazosin (MINIPRESS) 1 MG capsule Take 1 mg by mouth at bedtime. 11/18/15  Yes Historical Provider, MD  trolamine salicylate (ASPERCREME) 10 % cream Apply 1 application topically daily as needed for muscle pain.   Yes Historical Provider, MD  Vilazodone HCl (VIIBRYD) 40 MG TABS Take 40 mg by mouth daily.   Yes Historical Provider, MD  vitamin B-12 (CYANOCOBALAMIN) 500 MCG tablet Take 500 mcg by mouth daily.   Yes Historical Provider, MD  cyclobenzaprine (FLEXERIL) 10 MG tablet Take 1 tablet (10 mg total) by mouth 2 (two) times daily as needed for muscle  spasms. Patient not taking: Reported on 11/08/2015 09/25/15   Gareth Morgan, MD  dicyclomine (BENTYL) 20 MG tablet Take 1 tablet (20 mg total) by mouth 2 (two) times daily. Patient not taking: Reported on 12/06/2015 11/08/15   [name], MD  HYDROcodone-acetaminophen [facility]) 5-325 MG tablet Take 1 tablet by mouth every 6 (six) hours as needed for severe pain. Patient not taking: Reported on 09/25/2015 09/16/15   Mercedes Camprubi-Soms, PA-C  HYDROcodone-acetaminophen (NORCO/VICODIN) 5-325 MG tablet Take 1 tablet by mouth every 4 (four) hours as needed for moderate pain or severe pain. Patient not taking: Reported on 09/16/2015 07/17/15   Clayton Bibles, PA-C  oxyCODONE (ROXICODONE) 5 MG immediate release tablet Take 1 tablet (5 mg total) by mouth every 4 (four) hours as needed for severe pain. Patient not taking: Reported on 11/08/2015 10/06/15   Deno Etienne, DO  oxymetazoline (AFRIN) 0.05 % nasal spray Place 1 spray into both nostrils 2 (two) times daily as needed for congestion.    Historical Provider, MD  penicillin  v potassium (VEETID) 500 MG tablet Take 1 tablet (500 mg total) by mouth 4 (four) times daily. Patient not taking: Reported on 09/16/2015 07/17/15   Clayton Bibles, PA-C  ranitidine (ZANTAC) 150 MG tablet Take 1 tablet (150 mg total) by mouth 2 (two) times daily. Patient not taking: Reported on 09/25/2015 09/16/15   Mercedes Camprubi-Soms, PA-C   Allergies  Allergen Reactions  . Depakote [Divalproex Sodium] Anaphylaxis  . Diazepam Other (See Comments)    Died on operating table  . Valium Anaphylaxis  . Tizanidine Hcl Other (See Comments)    "passed out"    FAMILY HISTORY:  Family History  Problem Relation Age of Onset  . Breast cancer Mother     bilateral; ages 45 and 59; TAH/BSO ~50  . Depression Sister   . Heart disease Father   . Hypertension Father   . Colon cancer Neg Hx   . Esophageal cancer Neg Hx   . Pancreatic cancer Neg Hx   . Rectal cancer Neg Hx   . Stomach cancer Neg Hx     SOCIAL HISTORY: Social History   Social History  . Marital status: Single    Spouse name: N/A  . Number of children: N/A  . Years of education: N/A   Social History Main Topics  . Smoking status: Never Smoker  . Smokeless tobacco: Never Used  . 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  . Drug use: No  . Sexual activity: Yes    Birth control/ protection: Surgical     Comment: 1st intercourse- 19, partners- 6   Other Topics Concern  . None   Social History Narrative   Lives with 108 year old daughter.        REVIEW OF SYSTEMS:  Denies any rashes but does bruise easily. Denies any headache or vision changes. Reports pain in right wrist. A pertinent 14 point review of systems is negative except as per the history of presenting illness.  SUBJECTIVE: As above.  VITAL SIGNS: Temp:  [97.9 F (36.6 C)-98.2 F (36.8 C)] 98.2 F (36.8 C) (07/29 0726) Pulse Rate:  [70-79] 79 (07/29 0922) Resp:  [9-17] 16 (07/29 0922) BP: (89-112)/(57-69) 109/69 (07/29 0922) SpO2:  [94 %-100 %] 100 % (07/29 0922) Weight:  [137 lb (62.1 kg)] 137 lb (62.1 kg) (07/29 0154)  PHYSICAL EXAMINATION: General:  Awake. Alert. No acute distress. Laying in with lights off. Integument:  Warm & dry. No rash on exposed skin. No bruising. Lymphatics:  No appreciated cervical or supraclavicular lymphadenoapthy. HEENT:  Moist mucus membranes. No oral ulcers. No scleral injection or icterus. Poor dentition. Cardiovascular:  Regular rate. No edema. No appreciable JVD.  Pulmonary:  Good aeration & clear to auscultation bilaterally. Symmetric chest wall expansion. No accessory muscle use. Abdomen: Soft. Normal bowel sounds. Nondistended. Minimal discomfort to right upper quadrant palpation. Musculoskeletal:  Normal bulk and tone. Cast on right forearm/wrist. No joint deformity or effusion appreciated. Neurological:  CN 2-12 grossly in tact. No meningismus. Moving all 4  extremities equally. Symmetric brachioradialis deep tendon reflexes. Notably hard of hearing. Psychiatric:  Mood and affect congruent. Speech normal rhythm, rate & tone.    Recent Labs Lab 12/06/15 0136 12/06/15 0649  NA 135 135  K 3.8 4.3  CL 102 105  CO2 11* 11*  BUN 12 11  CREATININE 0.86 0.98  GLUCOSE 69 57*    Recent Labs Lab 12/06/15 0203  HGB 10.8*  HCT 32.5*  WBC 3.1*  PLT 272   Ct Abdomen Pelvis Wo Contrast  Result Date: 12/06/2015 CLINICAL DATA:  56 year old female with abdominal pain EXAM: CT ABDOMEN AND PELVIS WITHOUT CONTRAST TECHNIQUE: Multidetector CT imaging of the abdomen and pelvis was performed following the standard protocol without IV contrast. COMPARISON:  Abdominal CT dated 10/06/2015 and ultrasound dated 10/21/2015 FINDINGS: Evaluation of this exam is limited in the absence of intravenous contrast. Evaluation is also limited due to respiratory motion artifact. There is cardiomegaly. Mechanical mitral valve. There is hypoattenuation of the cardiac blood pool suggestive of a degree of anemia. Clinical correlation is recommended. There is slight prominence of the pulmonary vasculature is and the visualized lung bases which may represent mild congestive changes. No intra-abdominal free air or free fluid. Cholecystectomy. Fatty liver. The pancreas, spleen, adrenal glands, kidneys, visualized ureters, and urinary bladder appear unremarkable. The uterus is anteverted and grossly unremarkable. There is sigmoid diverticulosis without active inflammatory changes. There is a small hiatal hernia. There is no evidence of bowel obstruction or active inflammation. There is mild aortoiliac atherosclerotic disease. The abdominal aorta and IVC are grossly unremarkable on this noncontrast study. No portal venous gas identified. There is no adenopathy. There is a small fat containing umbilical hernia. The abdominal wall soft tissues are otherwise unremarkable. There is degenerative  changes of the spine. Scoliosis. Multilevel endplate irregularity and disc space narrowing. There is a chronic appearing 5 mm retropulsed fragment from the posterior aspect of the superior endplate of the L1 vertebrae. No acute fracture. IMPRESSION: No acute intra-abdominal pelvic pathology. Diverticulosis. Fatty liver. Electronically Signed   By: Anner Crete M.D.   On: 12/06/2015 06:44  Dg Forearm Right  Result Date: 12/04/2015 CLINICAL DATA:  Fall today with right distal forearm and wrist pain with swelling and bruising. EXAM: RIGHT FOREARM - 2 VIEW COMPARISON:  None. FINDINGS: Comminuted fracture the distal radial metaphysis extends to the articular surface apex anterior angulation noted at fracture site. No associated ulnar fracture is identified. IMPRESSION: Comminuted distal radius fracture with intra-articular extension. Electronically Signed   By: Misty Stanley M.D.   On: 12/04/2015 19:16  Dg Wrist Complete Right  Result Date: 12/04/2015 CLINICAL DATA:  Fall earlier today with wrist pain and swelling. EXAM: RIGHT WRIST - COMPLETE 3+ VIEW COMPARISON:  None. FINDINGS: Comminuted fracture of the distal radius identified with intra-articular extension. No associated fracture the distal ulna. Carpal alignment is anatomic. There are some degenerative changes at the first carpometacarpal joint and also at the first MCP joint. IMPRESSION: Comminuted distal radius fracture with intra-articular extension. Electronically Signed   By: Misty Stanley M.D.   On: 12/04/2015 19:17  Dg Abdomen Acute W/chest  Result Date: 12/06/2015 CLINICAL DATA:  55 year old female with abdominal pain EXAM: DG ABDOMEN ACUTE W/ 1V CHEST COMPARISON:  Chest radiograph dated 10/23/2015 FINDINGS: The lungs are clear. No pleural effusion or pneumothorax. Stable mild cardiomegaly. Median sternotomy wires and CABG vascular clips. Mechanical cardiac valve. There is no bowel dilatation or evidence of obstruction. No free air or  radiopaque calculi. Right upper quadrant cholecystectomy clips. There is degenerative changes of the spine and scoliosis. No acute fracture. IMPRESSION: No acute cardiopulmonary process. No bowel obstruction. Electronically Signed   By: Anner Crete M.D.   On: 12/06/2015 03:31   ASSESSMENT / PLAN:  55 year old female presenting with palpitations, nausea, vomiting, and abdominal pain. Found to have anion Metabolic acidosis. Patient has negative salicylate and alcohol levels. Denies any new medications other  than hydrocodone for pain. Currently respiratory status is stable on room air. Pain and nausea seem to be acceptably controlled at this time. Patient has a normal lactic acid.  1. Anion gap metabolic acidosis: Recommend continued workup. Defer to internal medicine. Would not recommend treatment at this time with bicarbonate due potential of worsening intracellular acidosis. 2. Abdominal pain with nausea and vomiting: No obvious abnormality on noncontrast CT scan of the abdomen. Recommend continued workup with consideration of right upper quadrant ultrasound and consideration of CT scan of the abdomen/pelvis with contrast.  Recommend further workup and admission by Internal Medicine Service/Hospitalist Service. No indication for ICU admission at this time. This was communicated to ED staff.  Sonia Baller Ashok Cordia, M.D. [facility] Pager:  [phone] After 3pm or if no response, call [phone] 12/06/2015, 11:07 AM

## 2015-12-06 NOTE — H&P (Signed)
History and Physical    KEYRY LENKER [id number] DOB: 26-Mar-1961 DOA: 12/06/2015  PCP: Anthoney Harada, MD Patient coming from: Home  Chief Complaint: Nausea, vomiting, abdominal pain  HPI: TATYANNA GALENTINE is a 55 y.o. female with medical history significant of alcohol dependence, ASD repair, depression. Patient presents to the [facility] emergency department for evaluation of palpitations, nausea, vomiting, and abdominal pain. Symptoms started yesterday and she feels the nausea and vomiting may be associated with her recent use of Norco. She reports 2 episodes of nonbloody, numb bilious emesis. No hematochezia or or melena. She reports having her gallbladder and appendix removed. Pain is achy and mainly epigastric and does not radiate. Her last drink, according to her, was 2 days ago. She states that she generally does not drink when her daughter is around. She is currently trying to quit drinking as she had a significant problem with this in the past. Palpitations resolved.  ED Course: On ED evaluation patient was found to have a metabolic acidosis with elevated anion gap to a nadir of 22. VBG was significant for venous pH of 7.160 and venous PCO2 of 27.4. I see was initially consulted, however, she did not meet criteria for ICU admission. Lactic acid was normal. Mental status was normal. She was given Phenergan for her nausea which she says did not help.  Review of Systems: As per HPI otherwise 10 point review of systems negative.   Past Medical History:  Diagnosis Date  . Allergy    SEASONAL  . Anemia   . Anxiety   . Arthritis   . Cardiomyopathy   . Deaf   . Depression   . Fibromyalgia   . Gastritis   . GERD (gastroesophageal reflux disease)   . Hearing impairment   . Hypertension    [name], take htn medication to regulate heart beat.  . Migraine   . Osteoporosis   . Pulmonary embolism ([place])    occured post c-section of her daughter  . Sleep apnea   . Vitamin D  deficiency     Past Surgical History:  Procedure Laterality Date  . APPENDECTOMY    . CARDIAC SURGERY  January 2007   mitral valve repair  . CARDIAC SURGERY  1973   atrial septal defect  . CARDIAC SURGERY    . COCHLEAR IMPLANT    . INNER EAR SURGERY     TUBES  . LAPAROSCOPIC CHOLECYSTECTOMY  2012  . TONSILLECTOMY AND ADENOIDECTOMY    . TUBAL LIGATION       reports that she has never smoked. She has never used smokeless tobacco. She reports that she drinks alcohol. She reports that she does not use drugs.  Allergies  Allergen Reactions  . Depakote [Divalproex Sodium] Anaphylaxis  . Diazepam Other (See Comments)    Died on operating table  . Valium Anaphylaxis  . Tizanidine Hcl Other (See Comments)    "passed out"    Family History  Problem Relation Age of Onset  . Breast cancer Mother     bilateral; ages 60 and 36; TAH/BSO ~50  . Depression Sister   . Heart disease Father   . Hypertension Father   . Colon cancer Neg Hx   . Esophageal cancer Neg Hx   . Pancreatic cancer Neg Hx   . Rectal cancer Neg Hx   . Stomach cancer Neg Hx    Unacceptable: Noncontributory, unremarkable, or negative. Acceptable: Family history reviewed and not pertinent (If you reviewed it)  Prior  to Admission medications   Medication Sig Start Date End Date Taking? Authorizing Provider  aspirin 81 MG chewable tablet Chew 81 mg by mouth daily.   Yes Historical Provider, MD  carvedilol (COREG) 25 MG tablet TAKE 1 TABLET BY MOUTH TWICE A DAY WITH MEALS (APPOINTMENT NEEDED FOR ADDITIONAL REFILLS) 10/17/15  Yes Jerline Pain, MD  Cholecalciferol (VITAMIN D3) 2000 units TABS Take 1 tablet by mouth daily.   Yes Historical Provider, MD  diphenoxylate-atropine (LOMOTIL) 2.5-0.025 MG tablet Take 1 tablet by mouth 4 (four) times daily as needed for diarrhea or loose stools. 11/08/15  Yes [name], MD  fexofenadine (ALLEGRA) 180 MG tablet Take 180 mg by mouth daily.   Yes Historical Provider, MD  folic acid  (FOLVITE) 1 MG tablet Take 1 mg by mouth daily.   Yes Historical Provider, MD  gabapentin (NEURONTIN) 600 MG tablet Take 600 mg by mouth at bedtime.    Yes Historical Provider, MD  hydrochlorothiazide (MICROZIDE) 12.5 MG capsule Take 1 capsule (12.5 mg total) by mouth every morning. 05/16/14  Yes Jerline Pain, MD  HYDROcodone-acetaminophen (NORCO/VICODIN) 5-325 MG tablet Take 1-2 tablets by mouth every 4 (four) hours as needed. Patient taking differently: Take 1-2 tablets by mouth every 4 (four) hours as needed (for pain.).  12/05/15  Yes Tiffany Carlota Raspberry, PA-C  hydroxypropyl methylcellulose / hypromellose (ISOPTO TEARS / GONIOVISC) 2.5 % ophthalmic solution Place 1 drop into both eyes daily as needed for dry eyes.   Yes Historical Provider, MD  mirabegron ER (MYRBETRIQ) 50 MG TB24 tablet Take 1 tablet (50 mg total) by mouth daily. 07/17/14  Yes Terrance Mass, MD  Multiple Vitamin (MULTIVITAMIN WITH MINERALS) TABS tablet Take 1 tablet by mouth daily.   Yes Historical Provider, MD  Omega-3 Fatty Acids (FISH OIL) 1000 MG CAPS Take 1,000 mg by mouth daily.    Yes Historical Provider, MD  ondansetron (ZOFRAN ODT) 4 MG disintegrating tablet Take 1 tablet (4 mg total) by mouth every 8 (eight) hours as needed for nausea. 11/08/15  Yes [name], MD  pantoprazole (PROTONIX) 40 MG tablet TAKE 1 TABLET (40 MG TOTAL) BY MOUTH DAILY. 03/29/15  Yes Davonna Belling, MD  prazosin (MINIPRESS) 1 MG capsule Take 1 mg by mouth at bedtime. 11/18/15  Yes Historical Provider, MD  trolamine salicylate (ASPERCREME) 10 % cream Apply 1 application topically daily as needed for muscle pain.   Yes Historical Provider, MD  Vilazodone HCl (VIIBRYD) 40 MG TABS Take 40 mg by mouth daily.   Yes Historical Provider, MD  vitamin B-12 (CYANOCOBALAMIN) 500 MCG tablet Take 500 mcg by mouth daily.   Yes Historical Provider, MD  cyclobenzaprine (FLEXERIL) 10 MG tablet Take 1 tablet (10 mg total) by mouth 2 (two) times daily as needed for muscle  spasms. Patient not taking: Reported on 11/08/2015 09/25/15   Gareth Morgan, MD  dicyclomine (BENTYL) 20 MG tablet Take 1 tablet (20 mg total) by mouth 2 (two) times daily. Patient not taking: Reported on 12/06/2015 11/08/15   [name], MD  HYDROcodone-acetaminophen [facility]) 5-325 MG tablet Take 1 tablet by mouth every 6 (six) hours as needed for severe pain. Patient not taking: Reported on 09/25/2015 09/16/15   Mercedes Camprubi-Soms, PA-C  HYDROcodone-acetaminophen (NORCO/VICODIN) 5-325 MG tablet Take 1 tablet by mouth every 4 (four) hours as needed for moderate pain or severe pain. Patient not taking: Reported on 09/16/2015 07/17/15   Clayton Bibles, PA-C  oxyCODONE (ROXICODONE) 5 MG immediate release tablet Take 1 tablet (  5 mg total) by mouth every 4 (four) hours as needed for severe pain. Patient not taking: Reported on 11/08/2015 10/06/15   Deno Etienne, DO  oxymetazoline (AFRIN) 0.05 % nasal spray Place 1 spray into both nostrils 2 (two) times daily as needed for congestion.    Historical Provider, MD  penicillin v potassium (VEETID) 500 MG tablet Take 1 tablet (500 mg total) by mouth 4 (four) times daily. Patient not taking: Reported on 09/16/2015 07/17/15   Clayton Bibles, PA-C  ranitidine (ZANTAC) 150 MG tablet Take 1 tablet (150 mg total) by mouth 2 (two) times daily. Patient not taking: Reported on 09/25/2015 09/16/15   Zacarias Pontes, PA-C    Physical Exam: Vitals:   12/06/15 0726 12/06/15 0922 12/06/15 1130 12/06/15 1212  BP: 99/63 109/69 105/63 104/65  Pulse: 79 79 78 78  Resp: 10 16 13 17   Temp: 98.2 F (36.8 C)     TempSrc: Oral     SpO2: 97% 100% 100% 99%  Weight:      Height:          Constitutional: NAD, calm, comfortable Vitals:   12/06/15 0726 12/06/15 0922 12/06/15 1130 12/06/15 1212  BP: 99/63 109/69 105/63 104/65  Pulse: 79 79 78 78  Resp: 10 16 13 17   Temp: 98.2 F (36.8 C)     TempSrc: Oral     SpO2: 97% 100% 100% 99%  Weight:      Height:       Eyes: PERRL, lids and  conjunctivae normal ENMT: Mucous membranes are moist. Posterior pharynx clear of any exudate or lesions.Normal dentition.  Neck: normal, supple, no masses, no thyromegaly Respiratory: clear to auscultation bilaterally, no wheezing, no crackles. Normal respiratory effort. No accessory muscle use.  Cardiovascular: Regular rate and rhythm, no murmurs / rubs / gallops. No extremity edema. 2+ pedal pulses. No carotid bruits.  Abdomen: mild RUQ tenderness, no masses palpated. No hepatosplenomegaly. Bowel sounds positive.  Musculoskeletal: no clubbing / cyanosis. No joint deformity upper and lower extremities. Good ROM, no contractures. Normal muscle tone.  Skin: no rashes, lesions, ulcers. No induration Neurologic: CN 2-12 grossly intact except VIII impaired. Sensation intact, DTR normal. Strength 5/5 in all 4.  Psychiatric: Normal judgment and insight. Alert and oriented x 3. Slightly depressed mood  Labs on Admission: I have personally reviewed following labs and imaging studies  CBC:  Recent Labs Lab 12/06/15 0203  WBC 3.1*  HGB 10.8*  HCT 32.5*  MCV 95.0  PLT [id number]   Basic Metabolic Panel:  Recent Labs Lab 12/06/15 0136 12/06/15 0649  NA 135 135  K 3.8 4.3  CL 102 105  CO2 11* 11*  GLUCOSE 69 57*  BUN 12 11  CREATININE 0.86 0.98  CALCIUM 8.2* 7.7*   GFR: Estimated Creatinine Clearance: 54.3 mL/min (by C-G formula based on SCr of 0.98 mg/dL). Liver Function Tests:  Recent Labs Lab 12/06/15 0136  AST 39  ALT 22  ALKPHOS 88  BILITOT 1.1  PROT 7.2  ALBUMIN 4.2    Recent Labs Lab 12/06/15 0136  LIPASE 33   No results for input(s): AMMONIA in the last 168 hours. Coagulation Profile: No results for input(s): INR, PROTIME in the last 168 hours. Cardiac Enzymes:  Recent Labs Lab 12/06/15 0136  CKTOTAL 25  TROPONINI <0.03   BNP (last 3 results) No results for input(s): PROBNP in the last 8760 hours. HbA1C: No results for input(s): HGBA1C in the last 72  hours. CBG:  Recent Labs  Lab 12/06/15 0939  GLUCAP 68   Urine analysis:    Component Value Date/Time   COLORURINE YELLOW 12/06/2015 0225   APPEARANCEUR CLEAR 12/06/2015 0225   LABSPEC 1.025 12/06/2015 0225   PHURINE 5.5 12/06/2015 0225   GLUCOSEU NEGATIVE 12/06/2015 0225   HGBUR TRACE (A) 12/06/2015 0225   BILIRUBINUR NEGATIVE 12/06/2015 0225   KETONESUR >80 (A) 12/06/2015 0225   PROTEINUR 100 (A) 12/06/2015 0225   UROBILINOGEN 0.2 12/15/2014 2213   NITRITE NEGATIVE 12/06/2015 0225   LEUKOCYTESUR NEGATIVE 12/06/2015 0225    Radiological Exams on Admission: Ct Abdomen Pelvis Wo Contrast  Result Date: 12/06/2015 CLINICAL DATA:  55 year old female with abdominal pain EXAM: CT ABDOMEN AND PELVIS WITHOUT CONTRAST TECHNIQUE: Multidetector CT imaging of the abdomen and pelvis was performed following the standard protocol without IV contrast. COMPARISON:  Abdominal CT dated 10/06/2015 and ultrasound dated 10/21/2015 FINDINGS: Evaluation of this exam is limited in the absence of intravenous contrast. Evaluation is also limited due to respiratory motion artifact. There is cardiomegaly. Mechanical mitral valve. There is hypoattenuation of the cardiac blood pool suggestive of a degree of anemia. Clinical correlation is recommended. There is slight prominence of the pulmonary vasculature is and the visualized lung bases which may represent mild congestive changes. No intra-abdominal free air or free fluid. Cholecystectomy. Fatty liver. The pancreas, spleen, adrenal glands, kidneys, visualized ureters, and urinary bladder appear unremarkable. The uterus is anteverted and grossly unremarkable. There is sigmoid diverticulosis without active inflammatory changes. There is a small hiatal hernia. There is no evidence of bowel obstruction or active inflammation. There is mild aortoiliac atherosclerotic disease. The abdominal aorta and IVC are grossly unremarkable on this noncontrast study. No portal venous  gas identified. There is no adenopathy. There is a small fat containing umbilical hernia. The abdominal wall soft tissues are otherwise unremarkable. There is degenerative changes of the spine. Scoliosis. Multilevel endplate irregularity and disc space narrowing. There is a chronic appearing 5 mm retropulsed fragment from the posterior aspect of the superior endplate of the L1 vertebrae. No acute fracture. IMPRESSION: No acute intra-abdominal pelvic pathology. Diverticulosis. Fatty liver. Electronically Signed   By: Anner Crete M.D.   On: 12/06/2015 06:44  Dg Forearm Right  Result Date: 12/04/2015 CLINICAL DATA:  Fall today with right distal forearm and wrist pain with swelling and bruising. EXAM: RIGHT FOREARM - 2 VIEW COMPARISON:  None. FINDINGS: Comminuted fracture the distal radial metaphysis extends to the articular surface apex anterior angulation noted at fracture site. No associated ulnar fracture is identified. IMPRESSION: Comminuted distal radius fracture with intra-articular extension. Electronically Signed   By: Misty Stanley M.D.   On: 12/04/2015 19:16  Dg Wrist Complete Right  Result Date: 12/04/2015 CLINICAL DATA:  Fall earlier today with wrist pain and swelling. EXAM: RIGHT WRIST - COMPLETE 3+ VIEW COMPARISON:  None. FINDINGS: Comminuted fracture of the distal radius identified with intra-articular extension. No associated fracture the distal ulna. Carpal alignment is anatomic. There are some degenerative changes at the first carpometacarpal joint and also at the first MCP joint. IMPRESSION: Comminuted distal radius fracture with intra-articular extension. Electronically Signed   By: Misty Stanley M.D.   On: 12/04/2015 19:17  Dg Abdomen Acute W/chest  Result Date: 12/06/2015 CLINICAL DATA:  55 year old female with abdominal pain EXAM: DG ABDOMEN ACUTE W/ 1V CHEST COMPARISON:  Chest radiograph dated 10/23/2015 FINDINGS: The lungs are clear. No pleural effusion or pneumothorax. Stable  mild cardiomegaly. Median sternotomy wires and CABG vascular clips. Mechanical cardiac valve.  There is no bowel dilatation or evidence of obstruction. No free air or radiopaque calculi. Right upper quadrant cholecystectomy clips. There is degenerative changes of the spine and scoliosis. No acute fracture. IMPRESSION: No acute cardiopulmonary process. No bowel obstruction. Electronically Signed   By: Anner Crete M.D.   On: 12/06/2015 03:31   EKG: Independently reviewed.  Assessment/Plan Principal Problem:   Increased anion gap metabolic acidosis Active Problems:   Alcohol dependence (HCC)   Abdominal pain   Essential hypertension   Anion gap metabolic acidosis Unknown etiology. No recent ingestions. Salicylate level and lactic acid within normal limits. Could be secondary to alcoholic ketoacidosis especially since urine ketones elevated. Patient vitals stable -repeat BMP now and in the AM -serum ketones - s/p 1.5NS bolus; will allow oral fluid intake  Abdominal pain Likely secondary to emesis. Improved. Lipase wnl. CT scan significant for diverticulosis without diverticulitis. - monitor symptoms  Alcohol dependence - CIWA protocol; diazepam listed as allergy, however, patient has tolerated lorazepam in the past  Hypertension -continue coreg   DVT prophylaxis: Lovenox Code Status: Full Code Family Communication: Non at bedside Disposition Plan: Discharge home pending improvement Consults called: None Admission status: Inpatient, telemetry   Cordelia Poche MD [facility]  If 7PM-7AM, please contact night-coverage [email or web address] Password [email or web address]  12/06/2015, 1:38 PM

## 2015-12-07 DIAGNOSIS — E872 Acidosis: Principal | ICD-10-CM

## 2015-12-07 LAB — BASIC METABOLIC PANEL
ANION GAP: 9 (ref 5–15)
BUN: 9 mg/dL (ref 6–20)
CHLORIDE: 113 mmol/L — AB (ref 101–111)
CO2: 19 mmol/L — AB (ref 22–32)
Calcium: 8.6 mg/dL — ABNORMAL LOW (ref 8.9–10.3)
Creatinine, Ser: 0.77 mg/dL (ref 0.44–1.00)
GFR calc Af Amer: >60 mL/min (ref >60)
GLUCOSE: 87 mg/dL (ref 65–99)
POTASSIUM: 3.6 mmol/L (ref 3.5–5.1)
Sodium: 141 mmol/L (ref 135–145)

## 2015-12-07 MED ORDER — HYDROCODONE-ACETAMINOPHEN 5-325 MG PO TABS: 1.0000 | ORAL_TABLET | ORAL | 0 refills | Status: DC | PRN

## 2015-12-07 NOTE — Discharge Instructions (Signed)
Follow with Anthoney Harada, MD in 1 week Follow up with Dr. Percell Miller from orthopedics in 1 week  Please get a complete blood count and chemistry panel checked by your Primary MD at your next visit, and again as instructed by your Primary MD. Please get your medications reviewed and adjusted by your Primary MD.  Please request your Primary MD to go over all Hospital Tests and Procedure/Radiological results at the follow up, please get all Hospital records sent to your Prim MD by signing hospital release before you go home.  If you had Pneumonia of Lung problems at the Hospital: Please get a 2 view Chest X ray done in 6-8 weeks after hospital discharge or sooner if instructed by your Primary MD.  If you have Congestive Heart Failure: Please call your Cardiologist or Primary MD anytime you have any of the following symptoms:  1) 3 pound weight gain in 24 hours or 5 pounds in 1 week  2) shortness of breath, with or without a dry hacking cough  3) swelling in the hands, feet or stomach  4) if you have to sleep on extra pillows at night in order to breathe  Follow cardiac low salt diet and 1.5 lit/day fluid restriction.  If you have diabetes Accuchecks 4 times/day, Once in AM empty stomach and then before each meal. Log in all results and show them to your primary doctor at your next visit. If any glucose reading is under 80 or above 300 call your primary MD immediately.  If you have Seizure/Convulsions/Epilepsy: Please do not drive, operate heavy machinery, participate in activities at heights or participate in high speed sports until you have seen by Primary MD or a Neurologist and advised to do so again.  If you had Gastrointestinal Bleeding: Please ask your Primary MD to check a complete blood count within one week of discharge or at your next visit. Your endoscopic/colonoscopic biopsies that are pending at the time of discharge, will also need to followed by your Primary MD.  Get  Medicines reviewed and adjusted. Please take all your medications with you for your next visit with your Primary MD  Please request your Primary MD to go over all hospital tests and procedure/radiological results at the follow up, please ask your Primary MD to get all Hospital records sent to his/her office.  If you experience worsening of your admission symptoms, develop shortness of breath, life threatening emergency, suicidal or homicidal thoughts you must seek medical attention immediately by calling 911 or calling your MD immediately  if symptoms less severe.  You must read complete instructions/literature along with all the possible adverse reactions/side effects for all the Medicines you take and that have been prescribed to you. Take any new Medicines after you have completely understood and accpet all the possible adverse reactions/side effects.   Do not drive or operate heavy machinery when taking Pain medications.   Do not take more than prescribed Pain, Sleep and Anxiety Medications  Special Instructions: If you have smoked or chewed Tobacco  in the last 2 yrs please stop smoking, stop any regular Alcohol  and or any Recreational drug use.  Wear Seat belts while driving.  Please note You were cared for by a hospitalist during your hospital stay. If you have any questions about your discharge medications or the care you received while you were in the hospital after you are discharged, you can call the unit and asked to speak with the hospitalist on call if the  hospitalist that took care of you is not available. Once you are discharged, your primary care physician will handle any further medical issues. Please note that NO REFILLS for any discharge medications will be authorized once you are discharged, as it is imperative that you return to your primary care physician (or establish a relationship with a primary care physician if you do not have one) for your aftercare needs so that they  can reassess your need for medications and monitor your lab values.  You can reach the hospitalist office at phone [phone] or fax [phone]   If you do not have a primary care physician, you can call [phone] for a physician referral.  Activity: As tolerated with Full fall precautions use walker/cane & assistance as needed  Diet: regular  Disposition Home

## 2015-12-07 NOTE — Discharge Summary (Signed)
Physician Discharge Summary  [name] [id number] DOB: [birth year] DOA: 12/06/2015  PCP: Anthoney Harada, MD  Admit date: 12/06/2015 Discharge date: 12/07/2015  Admitted From: home Disposition:  home  Recommendations for Outpatient Follow-up:  1. Follow up with PCP in 1-2 weeks  Home Health: none Equipment/Devices: none  Discharge Condition: stable CODE STATUS: Full Diet recommendation: regular  HPI: Per Dr. Lonny Prude,  [name] is a 55 y.o. female with medical history significant of alcohol dependence, ASD repair, depression. Patient presents to the [facility] emergency department for evaluation of palpitations, nausea, vomiting, and abdominal pain. Symptoms started yesterday and she feels the nausea and vomiting may be associated with her recent use of Norco. She reports 2 episodes of nonbloody, numb bilious emesis. No hematochezia or or melena. She reports having her gallbladder and appendix removed. Pain is achy and mainly epigastric and does not radiate. Her last drink, according to her, was 2 days ago. She states that she generally does not drink when her daughter is around. She is currently trying to quit drinking as she had a significant problem with this in the past. Palpitations resolved.  Hospital Course: Discharge Diagnoses:  Principal Problem:   Increased anion gap metabolic acidosis Active Problems:   Alcohol dependence (HCC)   Abdominal pain   Essential hypertension  Patient was admitted to the hospital with self limiting nausea and vomiting without apparent etiology, possibly related to alcohol abuse and home medications. She underwent a CT scan of the abdomen and pelvis on admission which showed no acute intra-abdominal pathology. Her lipase was normal. Her LFTs were normal. Her nausea and vomiting have resolved, and she is able to eat a regular diet. Patient states that at home every time she drinks alcohol she eats nothing and drank a couple days  prior to admission. She was found to be acidotic on admission with a bicarbonate of 11, and the presence of urine ketones strongly suggests starvation ketoacidosis. This resolved with IV fluids, bicarbonate on discharge is 19, recommend follow-up with PCP in about 1-2 weeks for repeat blood work. Discussed extensively with the patient to avoid alcohol completely. She showed no signs of alcohol withdrawal, and she consistently scored a 0 on CIWA scores. She was discharged home in stable condition with close outpatient follow-up.   Discharge Instructions     Medication List    TAKE these medications   aspirin 81 MG chewable tablet Chew 81 mg by mouth daily.   carvedilol 25 MG tablet Commonly known as:  COREG TAKE 1 TABLET BY MOUTH TWICE A DAY WITH MEALS (APPOINTMENT NEEDED FOR ADDITIONAL REFILLS)   diphenoxylate-atropine 2.5-0.025 MG tablet Commonly known as:  LOMOTIL Take 1 tablet by mouth 4 (four) times daily as needed for diarrhea or loose stools.   fexofenadine 180 MG tablet Commonly known as:  ALLEGRA Take 180 mg by mouth daily.   Fish Oil 1000 MG Caps Take 1,000 mg by mouth daily.   folic acid 1 MG tablet Commonly known as:  FOLVITE Take 1 mg by mouth daily.   gabapentin 600 MG tablet Commonly known as:  NEURONTIN Take 600 mg by mouth at bedtime.   hydrochlorothiazide 12.5 MG capsule Commonly known as:  MICROZIDE Take 1 capsule (12.5 mg total) by mouth every morning.   HYDROcodone-acetaminophen 5-325 MG tablet Commonly known as:  NORCO/VICODIN Take 1-2 tablets by mouth every 4 (four) hours as needed. What changed:  reasons to take this   hydroxypropyl methylcellulose / hypromellose 2.5 %  ophthalmic solution Commonly known as:  ISOPTO TEARS / GONIOVISC Place 1 drop into both eyes daily as needed for dry eyes.   mirabegron ER 50 MG Tb24 tablet Commonly known as:  MYRBETRIQ Take 1 tablet (50 mg total) by mouth daily.   multivitamin with minerals Tabs tablet Take  1 tablet by mouth daily.   ondansetron 4 MG disintegrating tablet Commonly known as:  ZOFRAN ODT Take 1 tablet (4 mg total) by mouth every 8 (eight) hours as needed for nausea.   oxymetazoline 0.05 % nasal spray Commonly known as:  AFRIN Place 1 spray into both nostrils 2 (two) times daily as needed for congestion.   pantoprazole 40 MG tablet Commonly known as:  PROTONIX TAKE 1 TABLET (40 MG TOTAL) BY MOUTH DAILY.   prazosin 1 MG capsule Commonly known as:  MINIPRESS Take 1 mg by mouth at bedtime.   trolamine salicylate 10 % cream Commonly known as:  ASPERCREME Apply 1 application topically daily as needed for muscle pain.   VIIBRYD 40 MG Tabs Generic drug:  Vilazodone HCl Take 40 mg by mouth daily.   vitamin B-12 500 MCG tablet Commonly known as:  CYANOCOBALAMIN Take 500 mcg by mouth daily.   Vitamin D3 2000 units Tabs Take 1 tablet by mouth daily.      Follow-up Information    MURPHY, TIMOTHY D, MD. Schedule an appointment as soon as possible for a visit in 1 week(s).   Specialty:  Orthopedic Surgery Contact information: [address] [place] [address] [phone]          Allergies  Allergen Reactions  . Depakote [Divalproex Sodium] Anaphylaxis  . Diazepam Other (See Comments)    Died on operating table  . Valium Anaphylaxis  . Tizanidine Hcl Other (See Comments)    "passed out"    Consultations:    Procedures/Studies:  Ct Abdomen Pelvis Wo Contrast  Result Date: 12/06/2015 CLINICAL DATA:  55 year old female with abdominal pain EXAM: CT ABDOMEN AND PELVIS WITHOUT CONTRAST TECHNIQUE: Multidetector CT imaging of the abdomen and pelvis was performed following the standard protocol without IV contrast. COMPARISON:  Abdominal CT dated 10/06/2015 and ultrasound dated 10/21/2015 FINDINGS: Evaluation of this exam is limited in the absence of intravenous contrast. Evaluation is also limited due to respiratory motion artifact. There is  cardiomegaly. Mechanical mitral valve. There is hypoattenuation of the cardiac blood pool suggestive of a degree of anemia. Clinical correlation is recommended. There is slight prominence of the pulmonary vasculature is and the visualized lung bases which may represent mild congestive changes. No intra-abdominal free air or free fluid. Cholecystectomy. Fatty liver. The pancreas, spleen, adrenal glands, kidneys, visualized ureters, and urinary bladder appear unremarkable. The uterus is anteverted and grossly unremarkable. There is sigmoid diverticulosis without active inflammatory changes. There is a small hiatal hernia. There is no evidence of bowel obstruction or active inflammation. There is mild aortoiliac atherosclerotic disease. The abdominal aorta and IVC are grossly unremarkable on this noncontrast study. No portal venous gas identified. There is no adenopathy. There is a small fat containing umbilical hernia. The abdominal wall soft tissues are otherwise unremarkable. There is degenerative changes of the spine. Scoliosis. Multilevel endplate irregularity and disc space narrowing. There is a chronic appearing 5 mm retropulsed fragment from the posterior aspect of the superior endplate of the L1 vertebrae. No acute fracture. IMPRESSION: No acute intra-abdominal pelvic pathology. Diverticulosis. Fatty liver. Electronically Signed   By: Anner Crete M.D.   On: 12/06/2015  06:44  Dg Forearm Right  Result Date: 12/04/2015 CLINICAL DATA:  Fall today with right distal forearm and wrist pain with swelling and bruising. EXAM: RIGHT FOREARM - 2 VIEW COMPARISON:  None. FINDINGS: Comminuted fracture the distal radial metaphysis extends to the articular surface apex anterior angulation noted at fracture site. No associated ulnar fracture is identified. IMPRESSION: Comminuted distal radius fracture with intra-articular extension. Electronically Signed   By: Misty Stanley M.D.   On: 12/04/2015 19:16  Dg Wrist  Complete Right  Result Date: 12/04/2015 CLINICAL DATA:  Fall earlier today with wrist pain and swelling. EXAM: RIGHT WRIST - COMPLETE 3+ VIEW COMPARISON:  None. FINDINGS: Comminuted fracture of the distal radius identified with intra-articular extension. No associated fracture the distal ulna. Carpal alignment is anatomic. There are some degenerative changes at the first carpometacarpal joint and also at the first MCP joint. IMPRESSION: Comminuted distal radius fracture with intra-articular extension. Electronically Signed   By: Misty Stanley M.D.   On: 12/04/2015 19:17  Dg Abdomen Acute W/chest  Result Date: 12/06/2015 CLINICAL DATA:  55 year old female with abdominal pain EXAM: DG ABDOMEN ACUTE W/ 1V CHEST COMPARISON:  Chest radiograph dated 10/23/2015 FINDINGS: The lungs are clear. No pleural effusion or pneumothorax. Stable mild cardiomegaly. Median sternotomy wires and CABG vascular clips. Mechanical cardiac valve. There is no bowel dilatation or evidence of obstruction. No free air or radiopaque calculi. Right upper quadrant cholecystectomy clips. There is degenerative changes of the spine and scoliosis. No acute fracture. IMPRESSION: No acute cardiopulmonary process. No bowel obstruction. Electronically Signed   By: Anner Crete M.D.   On: 12/06/2015 03:31     Subjective: - no chest pain, shortness of breath, no abdominal pain, nausea or vomiting.   Discharge Exam: Vitals:   12/06/15 2046 12/07/15 0529  BP: (!) 101/56 (!) 157/84  Pulse: 78 86  Resp: 16 18  Temp: 98.8 F (37.1 C) 98 F (36.7 C)   Vitals:   12/06/15 1500 12/06/15 1600 12/06/15 2046 12/07/15 0529  BP: 124/69  (!) 101/56 (!) 157/84  Pulse: 82  78 86  Resp: 14  16 18   Temp: 98.3 F (36.8 C) 98.7 F (37.1 C) 98.8 F (37.1 C) 98 F (36.7 C)  TempSrc: Oral Oral Oral Oral  SpO2: 100%  98% 100%  Weight:      Height: 5\' 3"  (1.6 m)       General: Pt is alert, awake, not in acute distress Cardiovascular: RRR,  S1/S2 +, no rubs, no gallops Respiratory: CTA bilaterally, no wheezing, no rhonchi Abdominal: Soft, NT, ND, bowel sounds + Extremities: no edema, no cyanosis    The results of significant diagnostics from this hospitalization (including imaging, microbiology, ancillary and laboratory) are listed below for reference.     Microbiology: Recent Results (from the past 240 hour(s))  MRSA PCR Screening     Status: None   Collection Time: 12/06/15  3:00 PM  Result Value Ref Range Status   MRSA by PCR NEGATIVE NEGATIVE Final    Comment:        The GeneXpert MRSA Assay (FDA approved for NASAL specimens only), is one component of a comprehensive MRSA colonization surveillance program. It is not intended to diagnose MRSA infection nor to guide or monitor treatment for MRSA infections.      Labs: BNP (last 3 results) No results for input(s): BNP in the last 8760 hours. Basic Metabolic Panel:  Recent Labs Lab 12/06/15 0136 12/06/15 0649 12/06/15 1726 12/07/15 0725  NA 135 135 134* 141  K 3.8 4.3 3.6 3.6  CL 102 105 109 113*  CO2 11* 11* 13* 19*  GLUCOSE 69 57* 79 87  BUN 12 11 10 9   CREATININE 0.86 0.98 0.84 0.77  CALCIUM 8.2* 7.7* 8.0* 8.6*   Liver Function Tests:  Recent Labs Lab 12/06/15 0136  AST 39  ALT 22  ALKPHOS 88  BILITOT 1.1  PROT 7.2  ALBUMIN 4.2    Recent Labs Lab 12/06/15 0136  LIPASE 33   No results for input(s): AMMONIA in the last 168 hours. CBC:  Recent Labs Lab 12/06/15 0203  WBC 3.1*  HGB 10.8*  HCT 32.5*  MCV 95.0  PLT 272   Cardiac Enzymes:  Recent Labs Lab 12/06/15 0136  CKTOTAL 13  TROPONINI <0.03   BNP: Invalid input(s): POCBNP CBG:  Recent Labs Lab 12/06/15 0939 12/06/15 1709  GLUCAP 68 97   D-Dimer No results for input(s): DDIMER in the last 72 hours. Hgb A1c No results for input(s): HGBA1C in the last 72 hours. Lipid Profile No results for input(s): CHOL, HDL, LDLCALC, TRIG, CHOLHDL, LDLDIRECT in the  last 72 hours. Thyroid function studies No results for input(s): TSH, T4TOTAL, T3FREE, THYROIDAB in the last 72 hours.  Invalid input(s): FREET3 Anemia work up No results for input(s): VITAMINB12, FOLATE, FERRITIN, TIBC, IRON, RETICCTPCT in the last 72 hours. Urinalysis    Component Value Date/Time   COLORURINE YELLOW 12/06/2015 0225   APPEARANCEUR CLEAR 12/06/2015 0225   LABSPEC 1.025 12/06/2015 0225   PHURINE 5.5 12/06/2015 0225   GLUCOSEU NEGATIVE 12/06/2015 0225   HGBUR TRACE (A) 12/06/2015 0225   BILIRUBINUR NEGATIVE 12/06/2015 0225   KETONESUR >80 (A) 12/06/2015 0225   PROTEINUR 100 (A) 12/06/2015 0225   UROBILINOGEN 0.2 12/15/2014 2213   NITRITE NEGATIVE 12/06/2015 0225   LEUKOCYTESUR NEGATIVE 12/06/2015 0225   Sepsis Labs Invalid input(s): PROCALCITONIN,  WBC,  LACTICIDVEN Microbiology Recent Results (from the past 240 hour(s))  MRSA PCR Screening     Status: None   Collection Time: 12/06/15  3:00 PM  Result Value Ref Range Status   MRSA by PCR NEGATIVE NEGATIVE Final    Comment:        The GeneXpert MRSA Assay (FDA approved for NASAL specimens only), is one component of a comprehensive MRSA colonization surveillance program. It is not intended to diagnose MRSA infection nor to guide or monitor treatment for MRSA infections.      Time coordinating discharge: Over 30 minutes  SIGNED:  Marzetta Board, MD  [facility] 12/07/2015, 2:00 PM Pager [phone]  If 7PM-7AM, please contact night-coverage [email or web address] Password [email or web address]

## 2015-12-09 ENCOUNTER — Ambulatory Visit (INDEPENDENT_AMBULATORY_CARE_PROVIDER_SITE_OTHER): Payer: Medicare Other | Admitting: Cardiology

## 2015-12-09 ENCOUNTER — Encounter: Payer: Self-pay | Admitting: Cardiology

## 2015-12-09 VITALS — BP 124/78 | HR 90 | Ht 63.0 in | Wt 135.0 lb

## 2015-12-09 DIAGNOSIS — F4310 Post-traumatic stress disorder, unspecified: Secondary | ICD-10-CM

## 2015-12-09 DIAGNOSIS — R002 Palpitations: Secondary | ICD-10-CM

## 2015-12-09 DIAGNOSIS — E785 Hyperlipidemia, unspecified: Secondary | ICD-10-CM | POA: Diagnosis not present

## 2015-12-09 DIAGNOSIS — R0600 Dyspnea, unspecified: Secondary | ICD-10-CM

## 2015-12-09 HISTORY — DX: Post-traumatic stress disorder, unspecified: F43.10

## 2015-12-09 LAB — MISC LABCORP TEST (SEND OUT): LABCORP TEST CODE: 4788

## 2015-12-09 MED ORDER — CARVEDILOL 25 MG PO TABS: ORAL_TABLET | ORAL | 11 refills | Status: DC

## 2015-12-09 NOTE — Patient Instructions (Signed)
Medication Instructions:  Please stop your Hydrochlorothiazide. Continue all other medications as listed.  Follow-Up: Follow up in 1 year with Dr. Marlou Porch.  You will receive a letter in the mail 2 months before you are due.  Please call us when you receive this letter to schedule your follow up appointment.  If you need a refill on your cardiac medications before your next appointment, please call your pharmacy.  Thank you for choosing [facility]!!

## 2015-12-09 NOTE — Telephone Encounter (Signed)
PC to pt has not had her appointment with PCP. Will see him on 12/16/15 to check for clearance for Reclast.

## 2015-12-09 NOTE — Progress Notes (Signed)
[facility]. [address], [place], [place]  [address] Phone: [phone] Fax:  [phone]  Date:  12/09/2015   ID:  [name], DOB 05/08/1961, MRN [id number]  PCP:  Anthoney Harada, MD   History of Present Illness: [name] is a 55 y.o. 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 She felt dizzy and felt as though she was going to pass out, hands were numb. She had nausea as well as epigastric abdominal discomfort, nonradiating, crampy. 7/10. She took an extra dose of her carvedilol 25 mg during episode of palpitations. Chest x-ray unremarkable. EKG on 06/24/13 showed nonspecific ST-T wave changes, sinus rhythm, borderline QT prolongation. Once she ate she felt better.   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 She was having symptoms of trouble swallowing.  She has noted shortness of breath with minimal activity. Pounding of heart during minimal activity. She is concerned that her heart may be weekend after her multiple illnesses. Likely we went over her echocardiogram which showed normal ejection fraction. She does have grade 2 diastolic dysfunction which is being treated.  She had a terrible illness in June resulting in a two-week hospitalization after taking tizanidine. She was found in her bed and urine, swelling, hypotensive. She also recently slipped on the kitchen floor and broke her wrist.   Wt Readings from Last 3 Encounters:  12/09/15 135 lb (61.2 kg)  12/06/15 137 lb (62.1 kg)  11/08/15 145 lb (65.8 kg)     Past Medical History:  Diagnosis Date  . Allergy    SEASONAL  . Anemia   . Anxiety   . Arthritis   . Cardiomyopathy   . Deaf   . Depression   . Fibromyalgia   . Gastritis   . GERD (gastroesophageal  reflux disease)   . Hearing impairment   . Hypertension    [name], take htn medication to regulate heart beat.  . Migraine   . Osteoporosis   . Pulmonary embolism ([place])    occured post c-section of her daughter  . Sleep apnea   . Vitamin D deficiency     Past Surgical History:  Procedure Laterality Date  . APPENDECTOMY    . CARDIAC SURGERY  January 2007   mitral valve repair  . CARDIAC SURGERY  1973   atrial septal defect  . CARDIAC SURGERY    . COCHLEAR IMPLANT    . INNER EAR SURGERY     TUBES  . LAPAROSCOPIC CHOLECYSTECTOMY  2012  . TONSILLECTOMY AND ADENOIDECTOMY    . TUBAL LIGATION      Current Outpatient Prescriptions  Medication Sig Dispense Refill  . aspirin 81 MG chewable tablet Chew 81 mg by mouth daily.    . carvedilol (COREG) 25 MG tablet TAKE 1 TABLET BY MOUTH TWICE A DAY WITH MEALS (APPOINTMENT NEEDED FOR ADDITIONAL REFILLS) 60 tablet 11  . Cholecalciferol (VITAMIN D3) 2000 units TABS Take 1 tablet by mouth daily.    . diphenoxylate-atropine (LOMOTIL) 2.5-0.025 MG tablet Take 1 tablet by mouth 4 (four) times daily as needed for diarrhea or loose stools. 30 tablet 0  . fexofenadine (ALLEGRA) 180 MG tablet Take 180 mg by mouth daily.    . folic acid (FOLVITE) 1 MG tablet Take 1 mg  by mouth daily.    [name] gabapentin (NEURONTIN) 600 MG tablet Take 600 mg by mouth at bedtime.     . mirabegron ER (MYRBETRIQ) 50 MG TB24 tablet Take 1 tablet (50 mg total) by mouth daily. 30 tablet 11  . Multiple Vitamin (MULTIVITAMIN WITH MINERALS) TABS tablet Take 1 tablet by mouth daily.    . Omega-3 Fatty Acids (FISH OIL) 1000 MG CAPS Take 1,000 mg by mouth daily.     . ondansetron (ZOFRAN ODT) 4 MG disintegrating tablet Take 1 tablet (4 mg total) by mouth every 8 (eight) hours as needed for nausea. 6 tablet 0  . oxymetazoline (AFRIN) 0.05 % nasal spray Place 1 spray into both nostrils 2 (two) times daily as needed for congestion.    . pantoprazole (PROTONIX) 40 MG tablet TAKE 1 TABLET  (40 MG TOTAL) BY MOUTH DAILY. 14 tablet 1  . Vilazodone HCl (VIIBRYD) 40 MG TABS Take 40 mg by mouth daily.    . vitamin B-12 (CYANOCOBALAMIN) 500 MCG tablet Take 500 mcg by mouth daily.     No current facility-administered medications for this visit.     Allergies:    Allergies  Allergen Reactions  . Depakote [Divalproex Sodium] Anaphylaxis  . Diazepam Other (See Comments)    Died on operating table  . Valium Anaphylaxis  . Tizanidine Hcl Other (See Comments)    "passed out"    Social History:  The patient  reports that she has never smoked. She has never used smokeless tobacco. She reports that she drinks alcohol. She reports that she does not use drugs.   ROS:  Please see the history of present illness.   Denies any fevers, chills, orthopnea, PND, no vomiting    PHYSICAL EXAM: VS:  BP 124/78   Pulse 90   Ht 5\' 3"  (1.6 m)   Wt 135 lb (61.2 kg)   BMI 23.91 kg/m  Well nourished, well developed, in no acute distress  HEENT: normal  Neck: no JVD  Cardiac:  normal S1, S2; RRR; soft systolic murmur apex. Lungs:  clear to auscultation bilaterally, no wheezing, rhonchi or rales  Abd: soft, nontender, no hepatomegaly  Ext: no edema  Skin: warm and dry, wrist cast.  Neuro: no focal abnormalities noted  EKG:  As above   Echocardiogram: 08/2011 -Normal ejection fraction -Mild mitral regurgitation, prior mitral valve repair -Intact ASD repair  ECHO 10/20/15: - Left ventricle: The cavity size was normal. Wall thickness was   normal. Systolic function was normal. The estimated ejection   fraction was in the range of 55% to 60%. Wall motion was normal;   there were no regional wall motion abnormalities. Features are   consistent with a pseudonormal left ventricular filling pattern,   with concomitant abnormal relaxation and increased filling   pressure (grade 2 diastolic dysfunction). - Mitral valve: Calcified annulus. - Left atrium: The atrium was mildly dilated. - Right  ventricle: The cavity size was mildly dilated. Wall   thickness was normal. Systolic function was mildly reduced. - Right atrium: The atrium was moderately dilated. - Tricuspid valve: There was moderate regurgitation. - Pulmonary arteries: Systolic pressure was mildly increased. PA   peak pressure: 43 mm Hg (S).  ASSESSMENT AND PLAN:  1. Palpitations-resolved no recent issues, previously, no adverse arrhythmias detected in the emergency department. EKG unremarkable. Continuing with beta blocker. May have been anxiety, hunger provoking discomfort. After she ate, her symptoms resolved. Her tingling in her hands bilaterally he may have been  hyperventilation. 2. Mitral valve repair, ASD repair-stable.echocardiogram 2013 reviewed 3. Dyspnea -  Echo. Normal EF. Grade 2 diastolic dysfunction. Continue to treat hypertension. I'm fine with her stopping her HCTZ cousin of recent hypotension. She is also off of Prazosin which she was taking for nightmares. Sensation of shortness of breath, heart pounding with minimal activity may be from deconditioning especially from multiple hospitalizations. Continue to exercise 4. Hearing loss- has talked with ENT about cochlear implant. Would be fine from CV standpoint.  5. Hyperlipidemia-monitored by Dr. [name] 6. Depression/Behavioral Health-she was in the hospital for 2 weeks in June after being found in her bed with hypotension, acidosis. She thinks it was related to tizanidine. She also slipped on her linoleum floor and broke her wrist recently. 7. I will see her back in 12 months  Signed, Candee Furbish, MD [facility]  12/09/2015 11:22 AM

## 2015-12-12 DIAGNOSIS — M47812 Spondylosis without myelopathy or radiculopathy, cervical region: Secondary | ICD-10-CM | POA: Diagnosis not present

## 2015-12-17 ENCOUNTER — Inpatient Hospital Stay (HOSPITAL_COMMUNITY)
Admission: EM | Admit: 2015-12-17 | Discharge: 2015-12-26 | DRG: 208 | Disposition: A | Discharge status: Home Health | Payer: Medicare Other | Attending: Internal Medicine | Admitting: Internal Medicine

## 2015-12-17 ENCOUNTER — Encounter (HOSPITAL_COMMUNITY): Payer: Self-pay | Admitting: Emergency Medicine

## 2015-12-17 ENCOUNTER — Observation Stay (HOSPITAL_COMMUNITY): Payer: Medicare Other

## 2015-12-17 ENCOUNTER — Emergency Department (HOSPITAL_COMMUNITY): Payer: Medicare Other

## 2015-12-17 DIAGNOSIS — N179 Acute kidney failure, unspecified: Secondary | ICD-10-CM | POA: Diagnosis not present

## 2015-12-17 DIAGNOSIS — Z8249 Family history of ischemic heart disease and other diseases of the circulatory system: Secondary | ICD-10-CM

## 2015-12-17 DIAGNOSIS — J9811 Atelectasis: Secondary | ICD-10-CM | POA: Diagnosis present

## 2015-12-17 DIAGNOSIS — E860 Dehydration: Secondary | ICD-10-CM | POA: Diagnosis present

## 2015-12-17 DIAGNOSIS — Z9114 Patient's other noncompliance with medication regimen: Secondary | ICD-10-CM

## 2015-12-17 DIAGNOSIS — I469 Cardiac arrest, cause unspecified: Secondary | ICD-10-CM | POA: Diagnosis present

## 2015-12-17 DIAGNOSIS — Z818 Family history of other mental and behavioral disorders: Secondary | ICD-10-CM

## 2015-12-17 DIAGNOSIS — E873 Alkalosis: Secondary | ICD-10-CM | POA: Diagnosis present

## 2015-12-17 DIAGNOSIS — Z452 Encounter for adjustment and management of vascular access device: Secondary | ICD-10-CM | POA: Diagnosis not present

## 2015-12-17 DIAGNOSIS — L8990 Pressure ulcer of unspecified site, unspecified stage: Secondary | ICD-10-CM | POA: Insufficient documentation

## 2015-12-17 DIAGNOSIS — J690 Pneumonitis due to inhalation of food and vomit: Secondary | ICD-10-CM | POA: Diagnosis not present

## 2015-12-17 DIAGNOSIS — R1319 Other dysphagia: Secondary | ICD-10-CM

## 2015-12-17 DIAGNOSIS — J9601 Acute respiratory failure with hypoxia: Secondary | ICD-10-CM | POA: Diagnosis present

## 2015-12-17 DIAGNOSIS — H9190 Unspecified hearing loss, unspecified ear: Secondary | ICD-10-CM | POA: Diagnosis present

## 2015-12-17 DIAGNOSIS — F1010 Alcohol abuse, uncomplicated: Secondary | ICD-10-CM | POA: Diagnosis not present

## 2015-12-17 DIAGNOSIS — Z6826 Body mass index (BMI) 26.0-26.9, adult: Secondary | ICD-10-CM

## 2015-12-17 DIAGNOSIS — G9341 Metabolic encephalopathy: Secondary | ICD-10-CM | POA: Diagnosis present

## 2015-12-17 DIAGNOSIS — D638 Anemia in other chronic diseases classified elsewhere: Secondary | ICD-10-CM | POA: Diagnosis present

## 2015-12-17 DIAGNOSIS — Z01818 Encounter for other preprocedural examination: Secondary | ICD-10-CM

## 2015-12-17 DIAGNOSIS — E872 Acidosis, unspecified: Secondary | ICD-10-CM

## 2015-12-17 DIAGNOSIS — K224 Dyskinesia of esophagus: Secondary | ICD-10-CM | POA: Diagnosis present

## 2015-12-17 DIAGNOSIS — T17990A Other foreign object in respiratory tract, part unspecified in causing asphyxiation, initial encounter: Secondary | ICD-10-CM | POA: Diagnosis not present

## 2015-12-17 DIAGNOSIS — E876 Hypokalemia: Secondary | ICD-10-CM

## 2015-12-17 DIAGNOSIS — I447 Left bundle-branch block, unspecified: Secondary | ICD-10-CM | POA: Diagnosis present

## 2015-12-17 DIAGNOSIS — T730XXA Starvation, initial encounter: Secondary | ICD-10-CM | POA: Diagnosis present

## 2015-12-17 DIAGNOSIS — Z4682 Encounter for fitting and adjustment of non-vascular catheter: Secondary | ICD-10-CM | POA: Diagnosis not present

## 2015-12-17 DIAGNOSIS — Z8774 Personal history of (corrected) congenital malformations of heart and circulatory system: Secondary | ICD-10-CM

## 2015-12-17 DIAGNOSIS — Z86711 Personal history of pulmonary embolism: Secondary | ICD-10-CM

## 2015-12-17 DIAGNOSIS — E8729 Other acidosis: Secondary | ICD-10-CM | POA: Diagnosis present

## 2015-12-17 DIAGNOSIS — X58XXXA Exposure to other specified factors, initial encounter: Secondary | ICD-10-CM | POA: Diagnosis present

## 2015-12-17 DIAGNOSIS — K219 Gastro-esophageal reflux disease without esophagitis: Secondary | ICD-10-CM | POA: Diagnosis present

## 2015-12-17 DIAGNOSIS — R0602 Shortness of breath: Secondary | ICD-10-CM | POA: Diagnosis not present

## 2015-12-17 DIAGNOSIS — I483 Typical atrial flutter: Secondary | ICD-10-CM

## 2015-12-17 DIAGNOSIS — I214 Non-ST elevation (NSTEMI) myocardial infarction: Secondary | ICD-10-CM | POA: Diagnosis present

## 2015-12-17 DIAGNOSIS — R578 Other shock: Secondary | ICD-10-CM | POA: Diagnosis present

## 2015-12-17 DIAGNOSIS — R1310 Dysphagia, unspecified: Secondary | ICD-10-CM | POA: Diagnosis present

## 2015-12-17 DIAGNOSIS — E785 Hyperlipidemia, unspecified: Secondary | ICD-10-CM | POA: Diagnosis present

## 2015-12-17 DIAGNOSIS — Z4659 Encounter for fitting and adjustment of other gastrointestinal appliance and device: Secondary | ICD-10-CM

## 2015-12-17 DIAGNOSIS — D696 Thrombocytopenia, unspecified: Secondary | ICD-10-CM | POA: Diagnosis present

## 2015-12-17 DIAGNOSIS — I4581 Long QT syndrome: Secondary | ICD-10-CM

## 2015-12-17 DIAGNOSIS — M797 Fibromyalgia: Secondary | ICD-10-CM | POA: Diagnosis present

## 2015-12-17 DIAGNOSIS — G4733 Obstructive sleep apnea (adult) (pediatric): Secondary | ICD-10-CM | POA: Diagnosis present

## 2015-12-17 DIAGNOSIS — F10239 Alcohol dependence with withdrawal, unspecified: Secondary | ICD-10-CM | POA: Diagnosis present

## 2015-12-17 DIAGNOSIS — K7010 Alcoholic hepatitis without ascites: Secondary | ICD-10-CM | POA: Diagnosis present

## 2015-12-17 DIAGNOSIS — D72829 Elevated white blood cell count, unspecified: Secondary | ICD-10-CM | POA: Diagnosis not present

## 2015-12-17 DIAGNOSIS — E875 Hyperkalemia: Secondary | ICD-10-CM | POA: Diagnosis present

## 2015-12-17 DIAGNOSIS — R112 Nausea with vomiting, unspecified: Secondary | ICD-10-CM | POA: Diagnosis not present

## 2015-12-17 DIAGNOSIS — D61818 Other pancytopenia: Secondary | ICD-10-CM | POA: Diagnosis present

## 2015-12-17 DIAGNOSIS — F329 Major depressive disorder, single episode, unspecified: Secondary | ICD-10-CM

## 2015-12-17 DIAGNOSIS — R109 Unspecified abdominal pain: Secondary | ICD-10-CM

## 2015-12-17 DIAGNOSIS — E43 Unspecified severe protein-calorie malnutrition: Secondary | ICD-10-CM | POA: Diagnosis present

## 2015-12-17 DIAGNOSIS — I429 Cardiomyopathy, unspecified: Secondary | ICD-10-CM | POA: Diagnosis present

## 2015-12-17 DIAGNOSIS — M810 Age-related osteoporosis without current pathological fracture: Secondary | ICD-10-CM | POA: Diagnosis present

## 2015-12-17 DIAGNOSIS — E1310 Other specified diabetes mellitus with ketoacidosis without coma: Secondary | ICD-10-CM | POA: Diagnosis present

## 2015-12-17 DIAGNOSIS — Y906 Blood alcohol level of 120-199 mg/100 ml: Secondary | ICD-10-CM | POA: Diagnosis present

## 2015-12-17 DIAGNOSIS — F419 Anxiety disorder, unspecified: Secondary | ICD-10-CM | POA: Diagnosis present

## 2015-12-17 DIAGNOSIS — L89151 Pressure ulcer of sacral region, stage 1: Secondary | ICD-10-CM | POA: Diagnosis present

## 2015-12-17 DIAGNOSIS — Z9049 Acquired absence of other specified parts of digestive tract: Secondary | ICD-10-CM

## 2015-12-17 DIAGNOSIS — J189 Pneumonia, unspecified organism: Secondary | ICD-10-CM

## 2015-12-17 DIAGNOSIS — Z888 Allergy status to other drugs, medicaments and biological substances status: Secondary | ICD-10-CM

## 2015-12-17 DIAGNOSIS — Z7982 Long term (current) use of aspirin: Secondary | ICD-10-CM

## 2015-12-17 DIAGNOSIS — I10 Essential (primary) hypertension: Secondary | ICD-10-CM | POA: Diagnosis present

## 2015-12-17 DIAGNOSIS — D6959 Other secondary thrombocytopenia: Secondary | ICD-10-CM | POA: Diagnosis present

## 2015-12-17 DIAGNOSIS — Z79899 Other long term (current) drug therapy: Secondary | ICD-10-CM

## 2015-12-17 DIAGNOSIS — Z952 Presence of prosthetic heart valve: Secondary | ICD-10-CM

## 2015-12-17 HISTORY — DX: Atherosclerotic heart disease of native coronary artery without angina pectoris: I25.10

## 2015-12-17 HISTORY — DX: Starvation, initial encounter: T73.0XXA

## 2015-12-17 HISTORY — DX: Atrial septal defect, unspecified: Q21.10

## 2015-12-17 HISTORY — DX: Acidosis: E87.2

## 2015-12-17 HISTORY — DX: Atrial septal defect: Q21.1

## 2015-12-17 HISTORY — DX: Other acidosis: E87.29

## 2015-12-17 HISTORY — DX: Acidosis, unspecified: E87.20

## 2015-12-17 LAB — COMPREHENSIVE METABOLIC PANEL
ALK PHOS: 130 U/L — AB (ref 38–126)
ALT: 34 U/L (ref 14–54)
ANION GAP: 35 — AB (ref 5–15)
AST: 77 U/L — ABNORMAL HIGH (ref 15–41)
Albumin: 4.8 g/dL (ref 3.5–5.0)
BUN: 17 mg/dL (ref 6–20)
CALCIUM: 8.1 mg/dL — AB (ref 8.9–10.3)
CHLORIDE: 98 mmol/L — AB (ref 101–111)
CO2: 7 mmol/L — ABNORMAL LOW (ref 22–32)
CREATININE: 1.23 mg/dL — AB (ref 0.44–1.00)
GFR, EST AFRICAN AMERICAN: 56 mL/min — AB (ref >60)
GFR, EST NON AFRICAN AMERICAN: 48 mL/min — AB (ref >60)
Glucose, Bld: 76 mg/dL (ref 65–99)
Potassium: 3.8 mmol/L (ref 3.5–5.1)
Sodium: 140 mmol/L (ref 135–145)
Total Bilirubin: 0.4 mg/dL (ref 0.3–1.2)
Total Protein: 8.1 g/dL (ref 6.5–8.1)

## 2015-12-17 LAB — BASIC METABOLIC PANEL
Anion gap: 25 — ABNORMAL HIGH (ref 5–15)
BUN: 14 mg/dL (ref 6–20)
BUN: 15 mg/dL (ref 6–20)
CALCIUM: 6.4 mg/dL — AB (ref 8.9–10.3)
CHLORIDE: 106 mmol/L (ref 101–111)
CO2: 8 mmol/L — AB (ref 22–32)
CREATININE: 1.17 mg/dL — AB (ref 0.44–1.00)
CREATININE: 1.04 mg/dL — AB (ref 0.44–1.00)
Calcium: 6.7 mg/dL — ABNORMAL LOW (ref 8.9–10.3)
Chloride: 113 mmol/L — ABNORMAL HIGH (ref 101–111)
GFR calc Af Amer: 60 mL/min — ABNORMAL LOW (ref >60)
GFR calc Af Amer: >60 mL/min (ref >60)
GFR calc non Af Amer: 51 mL/min — ABNORMAL LOW (ref >60)
GFR calc non Af Amer: 59 mL/min — ABNORMAL LOW (ref >60)
GLUCOSE: 146 mg/dL — AB (ref 65–99)
Glucose, Bld: 92 mg/dL (ref 65–99)
Potassium: 4.3 mmol/L (ref 3.5–5.1)
Potassium: 6 mmol/L — ABNORMAL HIGH (ref 3.5–5.1)
Sodium: 139 mmol/L (ref 135–145)
Sodium: 141 mmol/L (ref 135–145)

## 2015-12-17 LAB — BLOOD GAS, VENOUS
ACID-BASE DEFICIT: 25.5 mmol/L — AB (ref 0.0–2.0)
BICARBONATE: 5.8 meq/L — AB (ref 20.0–24.0)
Drawn by: 103701
O2 Saturation: 56.2 %
PCO2 VEN: 27.3 mmHg — AB (ref 45.0–50.0)
PO2 VEN: 38.7 mmHg (ref 31.0–45.0)
Patient temperature: 98.6
TCO2: 6.1 mmol/L (ref 0–100)
pH, Ven: 6.961 — CL (ref 7.250–7.300)

## 2015-12-17 LAB — URINE MICROSCOPIC-ADD ON

## 2015-12-17 LAB — URINALYSIS, ROUTINE W REFLEX MICROSCOPIC
Bilirubin Urine: NEGATIVE
GLUCOSE, UA: NEGATIVE mg/dL
Leukocytes, UA: NEGATIVE
NITRITE: NEGATIVE
PH: 5.5 (ref 5.0–8.0)
PROTEIN: 100 mg/dL — AB
Specific Gravity, Urine: 1.014 (ref 1.005–1.030)

## 2015-12-17 LAB — BLOOD GAS, ARTERIAL
ACID-BASE DEFICIT: 25.5 mmol/L — AB (ref 0.0–2.0)
Bicarbonate: 6.9 mEq/L — ABNORMAL LOW (ref 20.0–24.0)
Drawn by: 345601
FIO2: 1
LHR: 24 {breaths}/min
MECHVT: 460 mL
O2 SAT: 93.1 %
PATIENT TEMPERATURE: 98.6
PEEP/CPAP: 5 cmH2O
PH ART: 6.88 — AB (ref 7.350–7.450)
PO2 ART: 101 mmHg — AB (ref 80.0–100.0)
TCO2: 7.5 mmol/L (ref 0–100)
pCO2 arterial: 39 mmHg (ref 35.0–45.0)

## 2015-12-17 LAB — CBC
HCT: 42.8 % (ref 36.0–46.0)
Hemoglobin: 13.7 g/dL (ref 12.0–15.0)
MCH: 31.4 pg (ref 26.0–34.0)
MCHC: 32 g/dL (ref 30.0–36.0)
MCV: 98.2 fL (ref 78.0–100.0)
PLATELETS: 492 10*3/uL — AB (ref 150–400)
RBC: 4.36 MIL/uL (ref 3.87–5.11)
RDW: 15.4 % (ref 11.5–15.5)
WBC: 20.1 10*3/uL — ABNORMAL HIGH (ref 4.0–10.5)

## 2015-12-17 LAB — ETHANOL: Alcohol, Ethyl (B): 140 mg/dL — ABNORMAL HIGH (ref <5)

## 2015-12-17 LAB — RAPID URINE DRUG SCREEN, HOSP PERFORMED
AMPHETAMINES: NOT DETECTED
BARBITURATES: NOT DETECTED
BENZODIAZEPINES: NOT DETECTED
COCAINE: NOT DETECTED
Opiates: NOT DETECTED
Tetrahydrocannabinol: NOT DETECTED

## 2015-12-17 LAB — PHOSPHORUS: PHOSPHORUS: 4.4 mg/dL (ref 2.5–4.6)

## 2015-12-17 LAB — LACTIC ACID, PLASMA
LACTIC ACID, VENOUS: 9.8 mmol/L — AB (ref 0.5–1.9)
Lactic Acid, Venous: 6.3 mmol/L (ref 0.5–1.9)

## 2015-12-17 LAB — OSMOLALITY: OSMOLALITY: 308 mosm/kg — AB (ref 275–295)

## 2015-12-17 LAB — I-STAT TROPONIN, ED: TROPONIN I, POC: 0.01 ng/mL (ref 0.00–0.08)

## 2015-12-17 LAB — MAGNESIUM
Magnesium: 2.2 mg/dL (ref 1.7–2.4)
Magnesium: 2 mg/dL (ref 1.7–2.4)

## 2015-12-17 LAB — I-STAT CG4 LACTIC ACID, ED: Lactic Acid, Venous: 12.19 mmol/L (ref 0.5–1.9)

## 2015-12-17 LAB — TROPONIN I: Troponin I: 0.03 ng/mL (ref <0.03)

## 2015-12-17 LAB — LIPASE, BLOOD: LIPASE: 42 U/L (ref 11–51)

## 2015-12-17 MED ORDER — ETOMIDATE 2 MG/ML IV SOLN
20.0000 mg | Freq: Once | INTRAVENOUS | Status: AC
  Administered 2015-12-17: 20 mg via INTRAVENOUS

## 2015-12-17 MED ORDER — VITAMIN B-1 100 MG PO TABS
100.0000 mg | ORAL_TABLET | Freq: Every day | ORAL | Status: DC
Start: 2015-12-17 — End: 2015-12-18
  Filled 2015-12-17 (×2): qty 1

## 2015-12-17 MED ORDER — OMEGA-3-ACID ETHYL ESTERS 1 G PO CAPS
1.0000 g | ORAL_CAPSULE | Freq: Every day | ORAL | Status: DC
  Filled 2015-12-17 (×2): qty 1

## 2015-12-17 MED ORDER — ASPIRIN 81 MG PO CHEW
81.0000 mg | CHEWABLE_TABLET | Freq: Every day | ORAL | Status: DC
  Administered 2015-12-17: 81 mg via ORAL
  Filled 2015-12-17 (×2): qty 1

## 2015-12-17 MED ORDER — FENTANYL CITRATE (PF) 100 MCG/2ML IJ SOLN
100.0000 ug | INTRAMUSCULAR | Status: DC | PRN
  Administered 2015-12-18: 100 ug via INTRAVENOUS
  Filled 2015-12-17 (×2): qty 2

## 2015-12-17 MED ORDER — ALBUTEROL SULFATE (2.5 MG/3ML) 0.083% IN NEBU
2.5000 mg | INHALATION_SOLUTION | RESPIRATORY_TRACT | Status: DC | PRN
  Administered 2015-12-17: 2.5 mg via RESPIRATORY_TRACT
  Filled 2015-12-17 (×2): qty 3

## 2015-12-17 MED ORDER — DOCUSATE SODIUM 50 MG/5ML PO LIQD: 100.0000 mg | Freq: Two times a day (BID) | ORAL | Status: DC | PRN

## 2015-12-17 MED ORDER — ADULT MULTIVITAMIN W/MINERALS CH
1.0000 | ORAL_TABLET | Freq: Every day | ORAL | Status: DC
  Filled 2015-12-17 (×2): qty 1

## 2015-12-17 MED ORDER — DEXMEDETOMIDINE HCL IN NACL 200 MCG/50ML IV SOLN
0.0000 ug/kg/h | INTRAVENOUS | Status: DC
  Administered 2015-12-18: 0.5 ug/kg/h via INTRAVENOUS
  Administered 2015-12-18 (×4): 1 ug/kg/h via INTRAVENOUS
  Administered 2015-12-18: 0.1 ug/kg/h via INTRAVENOUS
  Administered 2015-12-19 (×3): 1 ug/kg/h via INTRAVENOUS
  Filled 2015-12-17 (×10): qty 50

## 2015-12-17 MED ORDER — CALCIUM GLUCONATE 10 % IV SOLN
1.0000 g | Freq: Once | INTRAVENOUS | Status: AC
  Administered 2015-12-18: 1 g via INTRAVENOUS
  Filled 2015-12-17 (×2): qty 10

## 2015-12-17 MED ORDER — FAMOTIDINE IN NACL 20-0.9 MG/50ML-% IV SOLN
20.0000 mg | Freq: Two times a day (BID) | INTRAVENOUS | Status: DC
  Administered 2015-12-18 – 2015-12-24 (×13): 20 mg via INTRAVENOUS
  Filled 2015-12-17 (×13): qty 50

## 2015-12-17 MED ORDER — PROPOFOL 1000 MG/100ML IV EMUL
INTRAVENOUS | Status: AC
  Administered 2015-12-17: 23:00:00
  Filled 2015-12-17: qty 100

## 2015-12-17 MED ORDER — THIAMINE HCL 100 MG/ML IJ SOLN
100.0000 mg | Freq: Once | INTRAMUSCULAR | Status: AC
Start: 2015-12-17 — End: 2015-12-17
  Administered 2015-12-17: 100 mg via INTRAVENOUS
  Filled 2015-12-17: qty 2

## 2015-12-17 MED ORDER — VITAMIN D3 25 MCG (1000 UNIT) PO TABS
2000.0000 [IU] | ORAL_TABLET | Freq: Every day | ORAL | Status: DC
  Filled 2015-12-17: qty 2

## 2015-12-17 MED ORDER — ROCURONIUM BROMIDE 50 MG/5ML IV SOLN
100.0000 mg | Freq: Once | INTRAVENOUS | Status: AC
  Administered 2015-12-17: 100 mg via INTRAVENOUS

## 2015-12-17 MED ORDER — SODIUM BICARBONATE 8.4 % IV SOLN
INTRAVENOUS | Status: DC
  Administered 2015-12-18 (×3): via INTRAVENOUS
  Filled 2015-12-17 (×4): qty 150

## 2015-12-17 MED ORDER — PRO-STAT SUGAR FREE PO LIQD
30.0000 mL | Freq: Two times a day (BID) | ORAL | Status: DC
  Administered 2015-12-17 – 2015-12-19 (×4): 30 mL
  Filled 2015-12-17 (×4): qty 30

## 2015-12-17 MED ORDER — CHLORHEXIDINE GLUCONATE 0.12% ORAL RINSE (MEDLINE KIT)
15.0000 mL | Freq: Two times a day (BID) | OROMUCOSAL | Status: DC
  Administered 2015-12-18 – 2015-12-19 (×4): 15 mL via OROMUCOSAL

## 2015-12-17 MED ORDER — GABAPENTIN 300 MG PO CAPS
600.0000 mg | ORAL_CAPSULE | Freq: Every day | ORAL | Status: DC
  Filled 2015-12-17: qty 2

## 2015-12-17 MED ORDER — HYDROXYZINE HCL 50 MG/ML IM SOLN
50.0000 mg | Freq: Once | INTRAMUSCULAR | Status: AC
  Administered 2015-12-17: 50 mg via INTRAMUSCULAR
  Filled 2015-12-17: qty 1

## 2015-12-17 MED ORDER — SODIUM CHLORIDE 0.9 % IV SOLN
INTRAVENOUS | Status: DC
  Administered 2015-12-17 – 2015-12-22 (×4): via INTRAVENOUS

## 2015-12-17 MED ORDER — ONDANSETRON HCL 4 MG PO TABS
4.0000 mg | ORAL_TABLET | Freq: Four times a day (QID) | ORAL | Status: DC | PRN
  Filled 2015-12-17: qty 1

## 2015-12-17 MED ORDER — ACETAMINOPHEN 650 MG RE SUPP: 650.0000 mg | Freq: Four times a day (QID) | RECTAL | Status: DC | PRN

## 2015-12-17 MED ORDER — ANTISEPTIC ORAL RINSE SOLUTION (CORINZ)
7.0000 mL | Freq: Four times a day (QID) | OROMUCOSAL | Status: DC
  Administered 2015-12-18 – 2015-12-19 (×7): 7 mL via OROMUCOSAL

## 2015-12-17 MED ORDER — SODIUM CHLORIDE 0.9 % IV BOLUS (SEPSIS)
1000.0000 mL | Freq: Once | INTRAVENOUS | Status: AC
  Administered 2015-12-17: 1000 mL via INTRAVENOUS

## 2015-12-17 MED ORDER — ACETAMINOPHEN 325 MG PO TABS
650.0000 mg | ORAL_TABLET | Freq: Four times a day (QID) | ORAL | Status: DC | PRN
  Administered 2015-12-18: 650 mg via ORAL
  Filled 2015-12-17 (×2): qty 2

## 2015-12-17 MED ORDER — IOPAMIDOL (ISOVUE-300) INJECTION 61%
100.0000 mL | Freq: Once | INTRAVENOUS | Status: AC | PRN
  Administered 2015-12-17: 100 mL via INTRAVENOUS

## 2015-12-17 MED ORDER — THIAMINE HCL 100 MG/ML IJ SOLN
100.0000 mg | Freq: Every day | INTRAMUSCULAR | Status: DC
  Filled 2015-12-17 (×2): qty 2

## 2015-12-17 MED ORDER — SODIUM CHLORIDE 0.9 % IV SOLN
3.0000 g | Freq: Four times a day (QID) | INTRAVENOUS | Status: DC
  Administered 2015-12-18 – 2015-12-23 (×23): 3 g via INTRAVENOUS
  Filled 2015-12-17 (×25): qty 3

## 2015-12-17 MED ORDER — SODIUM CHLORIDE 0.9 % IV BOLUS (SEPSIS)
1000.0000 mL | Freq: Once | INTRAVENOUS | Status: AC
Start: 2015-12-17 — End: 2015-12-17
  Administered 2015-12-17: 1000 mL via INTRAVENOUS

## 2015-12-17 MED ORDER — FAMOTIDINE IN NACL 20-0.9 MG/50ML-% IV SOLN
20.0000 mg | INTRAVENOUS | Status: DC
  Administered 2015-12-17: 20 mg via INTRAVENOUS
  Filled 2015-12-17 (×2): qty 50

## 2015-12-17 MED ORDER — ONDANSETRON HCL 4 MG/2ML IJ SOLN
4.0000 mg | INTRAMUSCULAR | Status: DC | PRN
  Administered 2015-12-17: 4 mg via INTRAVENOUS
  Filled 2015-12-17: qty 2

## 2015-12-17 MED ORDER — METOCLOPRAMIDE HCL 5 MG/ML IJ SOLN
10.0000 mg | Freq: Once | INTRAMUSCULAR | Status: AC
Start: 2015-12-17 — End: 2015-12-17
  Administered 2015-12-17: 10 mg via INTRAVENOUS
  Filled 2015-12-17: qty 2

## 2015-12-17 MED ORDER — MORPHINE SULFATE (PF) 4 MG/ML IV SOLN: 4.0000 mg | Freq: Once | INTRAVENOUS | Status: DC

## 2015-12-17 MED ORDER — GABAPENTIN 600 MG PO TABS
600.0000 mg | ORAL_TABLET | Freq: Every day | ORAL | Status: DC
  Filled 2015-12-17: qty 1

## 2015-12-17 MED ORDER — KETOROLAC TROMETHAMINE 15 MG/ML IJ SOLN
15.0000 mg | Freq: Four times a day (QID) | INTRAMUSCULAR | Status: DC | PRN
  Administered 2015-12-17: 15 mg via INTRAVENOUS
  Filled 2015-12-17: qty 1

## 2015-12-17 MED ORDER — VITAL HIGH PROTEIN PO LIQD
1000.0000 mL | ORAL | Status: DC
  Administered 2015-12-18: 15:00:00
  Administered 2015-12-18: 1000 mL
  Filled 2015-12-17: qty 1000

## 2015-12-17 MED ORDER — ZOLEDRONIC ACID 5 MG/100ML IV SOLN: 5.0000 mg | Freq: Once | INTRAVENOUS | Status: DC

## 2015-12-17 MED ORDER — ENOXAPARIN SODIUM 40 MG/0.4ML ~~LOC~~ SOLN
40.0000 mg | SUBCUTANEOUS | Status: DC
  Administered 2015-12-17: 40 mg via SUBCUTANEOUS
  Filled 2015-12-17: qty 0.4

## 2015-12-17 MED ORDER — SODIUM CHLORIDE 0.9% FLUSH
3.0000 mL | Freq: Two times a day (BID) | INTRAVENOUS | Status: DC
  Administered 2015-12-18 – 2015-12-25 (×14): 3 mL via INTRAVENOUS

## 2015-12-17 MED ORDER — CYANOCOBALAMIN 500 MCG PO TABS
500.0000 ug | ORAL_TABLET | Freq: Every day | ORAL | Status: DC
  Filled 2015-12-17: qty 1

## 2015-12-17 MED ORDER — CARVEDILOL 12.5 MG PO TABS: 25.0000 mg | ORAL_TABLET | Freq: Two times a day (BID) | ORAL | Status: DC

## 2015-12-17 MED ORDER — HYDROMORPHONE HCL 1 MG/ML IJ SOLN
0.5000 mg | Freq: Once | INTRAMUSCULAR | Status: AC
  Administered 2015-12-17: 0.5 mg via INTRAVENOUS
  Filled 2015-12-17: qty 1

## 2015-12-17 MED ORDER — FOLIC ACID 1 MG PO TABS
1.0000 mg | ORAL_TABLET | Freq: Every day | ORAL | Status: DC
  Filled 2015-12-17 (×2): qty 1

## 2015-12-17 MED ORDER — SODIUM CHLORIDE 0.9 % IV SOLN
INTRAVENOUS | Status: DC
  Administered 2015-12-17: 21:00:00 via INTRAVENOUS

## 2015-12-17 MED ORDER — FENTANYL CITRATE (PF) 100 MCG/2ML IJ SOLN: 100.0000 ug | INTRAMUSCULAR | Status: DC | PRN

## 2015-12-17 MED FILL — Medication: Qty: 1 | Status: AC

## 2015-12-17 NOTE — Progress Notes (Signed)
CRITICAL VALUE ALERT  Critical value received:  LACTIC 6.3  Date of notification: 12/17/15  Time of notification:  2136  Critical value read back:YES  Nurse who received alert: LISA/ Zaion Hreha  MD notified (1st page):  Forrest Moron NP  Time of first page:  2136  MD notified (2nd page):  Time of second page:  Responding MD:  Forrest Moron NP  Time MD responded:  2200

## 2015-12-17 NOTE — H&P (Signed)
TRH H&P   Patient Demographics:    [name], is a 55 y.o. female  MRN: [id number]   DOB - 03-28-1961  Admit Date - 12/17/2015  Outpatient Primary MD for the patient is Anthoney Harada, MD  Referring MD: Dr. Stark Jock  Outpatient Specialists: None   Patient coming from: Home  Chief Complaint  Patient presents with  . Nausea  . Emesis  . Shortness of Breath      HPI:    [name]  is a 55 y.o. female, With alcohol abuse, major depression, hypertension, hearing impairment who was recently hospitalized with increased anion gap metabolic acidosis likely in the setting of alcohol abuse with starvation ketoacidosis and poor by mouth intake. Symptoms improved with IV hydration and was discharged home. History provided by patient mother as patient is quite anxious and has hearing impairment, unable to comprehend properly. Patient lives with her daughter and drinks almost regularly. For past few days she has been drinking vodka and not eating or drinking any other fluids. Since started having nausea vomiting with epigastric pain this morning. Denied hematemesis, tremors or seizures. Denied any headaches, fevers, chills, chest pain, palpitations or shortness of breath. Patient informed that she had not eaten anything for past 3 days and also not taken her medications. No recent change in her medications or sick contact.  In the ED she was tachycardic to 120s, normal blood pressure and mildly tachypneic. Blood work showed WBC of 20.1 K, hemoglobin, platelets of 492. Chemistry showed sodium of 140, K of 3.8, chloride of 98, CO2 of 7 with anion gap of 35. Creatinine of 1.23, glucose of 76 and lactic acid of 12.19. Chest x-ray was unremarkable. LFT shows AST of 77 and alkaline phosphatase of 130. Alcohol level of 130 and normal lipase level. Venous blood gas showed pH of 6.9, PO2 of  38.7, bicarbonate of 5.8 and PCO2 of 27.3. Given 2 L IV normal saline bolus, 1 dose of IV Dilaudid and Reglan and hospitalist admission requested.   Review of systems:     limited as patient poorly communicative and as hearing impairment. Review of systems as outlined in history of present illness,     With Past History of the following :    Past Medical History:  Diagnosis Date  . Allergy    SEASONAL  . Anemia   . Anxiety   . Arthritis   . Cardiomyopathy   . Deaf   . Depression   . Fibromyalgia   . Gastritis   . GERD (gastroesophageal reflux disease)   . Hearing impairment   . Hypertension    [name], take htn medication to regulate heart beat.  . Migraine   . Osteoporosis   . Pulmonary embolism ([place])    occured post c-section of her daughter  . Sleep apnea   . Vitamin D deficiency  Past Surgical History:  Procedure Laterality Date  . APPENDECTOMY    . CARDIAC SURGERY  January 2007   mitral valve repair  . CARDIAC SURGERY  1973   atrial septal defect  . CARDIAC SURGERY    . COCHLEAR IMPLANT    . INNER EAR SURGERY     TUBES  . LAPAROSCOPIC CHOLECYSTECTOMY  2012  . TONSILLECTOMY AND ADENOIDECTOMY    . TUBAL LIGATION        Social History:     Social History  Substance Use Topics  . Smoking status: Never Smoker  . Smokeless tobacco: Never Used  . 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     Lives - With her daughter  Mobility - ambulatory     Family History :     Family History  Problem Relation Age of Onset  . Breast cancer Mother     bilateral; ages 38 and 38; TAH/BSO ~50  . Depression Sister   . Heart disease Father   . Hypertension Father   . Colon cancer Neg Hx   . Esophageal cancer Neg Hx   . Pancreatic cancer Neg Hx   . Rectal cancer Neg Hx   . Stomach cancer Neg Hx       Home Medications:   Prior to Admission medications   Medication Sig Start Date End Date Taking?  Authorizing Provider  aspirin 81 MG chewable tablet Chew 81 mg by mouth daily.   Yes Historical Provider, MD  carvedilol (COREG) 25 MG tablet TAKE 1 TABLET BY MOUTH TWICE A DAY WITH MEALS (APPOINTMENT NEEDED FOR ADDITIONAL REFILLS) 12/09/15  Yes Jerline Pain, MD  Cholecalciferol (VITAMIN D3) 2000 units TABS Take 1 tablet by mouth daily.   Yes Historical Provider, MD  diphenoxylate-atropine (LOMOTIL) 2.5-0.025 MG tablet Take 1 tablet by mouth 4 (four) times daily as needed for diarrhea or loose stools. 11/08/15  Yes [name], MD  fexofenadine (ALLEGRA) 180 MG tablet Take 180 mg by mouth daily.   Yes Historical Provider, MD  folic acid (FOLVITE) 1 MG tablet Take 1 mg by mouth daily.   Yes Historical Provider, MD  gabapentin (NEURONTIN) 600 MG tablet Take 600 mg by mouth at bedtime.    Yes Historical Provider, MD  mirabegron ER (MYRBETRIQ) 50 MG TB24 tablet Take 1 tablet (50 mg total) by mouth daily. 07/17/14  Yes Terrance Mass, MD  Omega-3 Fatty Acids (FISH OIL) 1000 MG CAPS Take 1,000 mg by mouth 2 (two) times daily.    Yes Historical Provider, MD  ondansetron (ZOFRAN ODT) 4 MG disintegrating tablet Take 1 tablet (4 mg total) by mouth every 8 (eight) hours as needed for nausea. 11/08/15  Yes [name], MD  pantoprazole (PROTONIX) 40 MG tablet TAKE 1 TABLET (40 MG TOTAL) BY MOUTH DAILY. 03/29/15  Yes Davonna Belling, MD  Vilazodone HCl (VIIBRYD) 40 MG TABS Take 40 mg by mouth daily.   Yes Historical Provider, MD  vitamin B-12 (CYANOCOBALAMIN) 500 MCG tablet Take 500 mcg by mouth daily.   Yes Historical Provider, MD     Allergies:     Allergies  Allergen Reactions  . Depakote [Divalproex Sodium] Anaphylaxis  . Diazepam Other (See Comments)    Died on operating table  . Valium Anaphylaxis  . Tizanidine Hcl Other (See Comments)    "passed out"     Physical Exam:   Vitals  Blood pressure 123/79, pulse [name])  121, temperature 98.6 F (37 C), temperature source Oral, resp. rate 24, SpO2 97  %.   Middle aged female lying in bed appears restless HEENT: No pallor, dry mucosa, no icterus, supple neck Chest: Clear to auscultation bilaterally, no added sounds CVS: S1 and S2 tachycardic, no murmurs rub or gallop GI: Soft, nondistended, bowel sounds present, epigastric tenderness to palpation Muscular skeletal: Warm, no edema CNS: Alert and oriented    Data Review:    CBC  Recent Labs Lab 12/17/15 1544  WBC 20.1*  HGB 13.7  HCT 42.8  PLT 492*  MCV 98.2  MCH 31.4  MCHC 32.0  RDW 15.4   ------------------------------------------------------------------------------------------------------------------  Chemistries   Recent Labs Lab 12/17/15 1544  NA 140  K 3.8  CL 98*  CO2 7*  GLUCOSE 76  BUN 17  CREATININE 1.23*  CALCIUM 8.1*  AST 77*  ALT 34  ALKPHOS 130*  BILITOT 0.4   ------------------------------------------------------------------------------------------------------------------ estimated creatinine clearance is 42.7 mL/min (by C-G formula based on SCr of 1.23 mg/dL). ------------------------------------------------------------------------------------------------------------------ No results for input(s): TSH, T4TOTAL, T3FREE, THYROIDAB in the last 72 hours.  Invalid input(s): FREET3  Coagulation profile No results for input(s): INR, PROTIME in the last 168 hours. ------------------------------------------------------------------------------------------------------------------- No results for input(s): DDIMER in the last 72 hours. -------------------------------------------------------------------------------------------------------------------  Cardiac Enzymes No results for input(s): CKMB, TROPONINI, MYOGLOBIN in the last 168 hours.  Invalid input(s): CK ------------------------------------------------------------------------------------------------------------------ No results found for:  BNP   ---------------------------------------------------------------------------------------------------------------  Urinalysis    Component Value Date/Time   COLORURINE YELLOW 12/06/2015 0225   APPEARANCEUR CLEAR 12/06/2015 0225   LABSPEC 1.025 12/06/2015 0225   PHURINE 5.5 12/06/2015 0225   GLUCOSEU NEGATIVE 12/06/2015 0225   HGBUR TRACE (A) 12/06/2015 0225   BILIRUBINUR NEGATIVE 12/06/2015 0225   KETONESUR >80 (A) 12/06/2015 0225   PROTEINUR 100 (A) 12/06/2015 0225   UROBILINOGEN 0.2 12/15/2014 2213   NITRITE NEGATIVE 12/06/2015 0225   LEUKOCYTESUR NEGATIVE 12/06/2015 0225    ----------------------------------------------------------------------------------------------------------------   Imaging Results:    Dg Chest 2 View  Result Date: 12/17/2015 CLINICAL DATA:  Shortness of breath, nausea and vomiting beginning this morning. EXAM: CHEST  2 VIEW COMPARISON:  PA and lateral chest 07/17/2015. FINDINGS: The lungs are clear. Heart size is normal. The patient is status post mitral valve repair. No pneumothorax or pleural effusion. Remote T12 compression fracture is seen. IMPRESSION: No acute disease. Electronically Signed   By: Inge Rise M.D.   On: 12/17/2015 15:23    EKG: Sinus tachycardia with prolonged QTC of 563.   Assessment & Plan:    Principal Problem:  Anion gap Metabolic acidosis Suspect severe dehydration with starvation ketoacidosis. She was hospitalized recently with similar symptoms of abdominal pain nausea and vomiting with significant anion gap metabolic acidosis suspected due to poor by mouth intake with heavy alcohol use. Nausea and vomiting improved with IV hydration and Reglan received in the ED. Admit to telemetry under observation. Received 2 L IV normal saline bolus in the ED. Ordered third liter normal saline followed by maintenance IV normal saline at 1 50 mL per hour. Has significantly elevated lactic acid >12. No clinical signs or symptoms of  sepsis. Check blood cultures. Given her abdominal pain will obtain CT of the abdomen and pelvis to rule out bowel ischemia.  Check urine for drug screen. (Mother informs patient had history of polysubstance abuse in the past), ethylene glycol level.   Active Problems:   Alcohol dependence ([place]) Monitor on CIWA. Supportive care with thiamine, folate  and multivitamin. She is allergies to diazepam and listed as anaphylaxis so cannot use benzodiazepine for withdrawal symptoms. Needs counseling on alcohol cessation.    Major depression, chronic ([facility]) Hold trazodone given prolonged QTC     Acute kidney injury ([place]) Prerenal secondary to dehydration. Monitor with aggressive hydration  Prolonged QTC (563) Monitor on telemetry. Hold QT prolonging agents (d/c villazodone , hold reglan) Check magnesium.    Essential hypertension Resume Coreg.     Leucocytosis Likely stress-induced. Follow CT abdomen and pelvis and blood cultures.        DVT Prophylaxis   Lovenox -  AM Labs Ordered, also please review Full Orders  Family Communication: Discussed with mother at bedside in detail  Code Status full code  Likely DC to  home  Condition: Fair  Consults called: None   Admission status: Observation   Time spent in minutes : 60   Louellen Molder M.D on 12/17/2015 at 6:04 PM  Between 7am to 7pm - Pager - [phone]. After 7pm go to [email or web address] - password [facility]  [facility] - Office  [phone]

## 2015-12-17 NOTE — Progress Notes (Addendum)
RRRN, Malachy Mood, paged this NP re: her response to a rapid call by nursing. Pt said "I can't breathe". At that time, pt displayed anxiety. VSS. No hypoxia or tachypnea. No stridor or other respiratory distress. Vistaril ordered. Malachy Mood wanted to take pt to SDU for tonight for closer monitoring. Order written. Likely can move back to floor in am. Will follow to see if Vistaril helped issue.  CXR on admit was normal. Venous blood gas was done on day shift. Will follow.  KJKG, NP [facility] Update: Upon arrival to SDU, pt was doing better. She was receiving 1L bolus for a continued elevated lactate level.  However, later, RN was working with pt and pt's eyes rolled back and she brady'd down. Code Blue was called. CPR for 6 mins with 1 dose Epi. ROSC. ER doc came and intubated pt. CXR showed RLL aspiration PNA. (pt had been vomiting at home under influence of ETOH).  Dr. Lake Bells, PCCM, to bedside for consult.  F/up ABG, BMP, Mg, phos, and troponins, EKG ordered. PCCM assumed care and spoke to family.  KJKG, NP [facility]

## 2015-12-17 NOTE — ED Notes (Signed)
Failed atmpt at collecting blood

## 2015-12-17 NOTE — ED Provider Notes (Signed)
I was called to the ICU/stepdown for a code. When I arrived, patient was being bagged and CPR was being performed. After a few mortise of CPR, patient had return of spontaneous circulation. However she was not waking up appropriately. Due to this she was intubated by me without difficulty. Patient is tachycardic but has an appropriate blood pressure. She has been placed on the ventilator. Discussed with the ICU E-Link intensivist, Dr. Oletta Darter and he is aware patient and one of his colleagues, Dr. Lake Bells is about to come see the patient. I discussed with mom/family. They state she has esophageal issues and that she seemed to choke on a pill.   Cardiopulmonary Resuscitation (CPR) Procedure Note Directed/Performed by: Ephraim Hamburger I personally directed ancillary staff and/or performed CPR in an effort to regain return of spontaneous circulation and to maintain cardiac, neuro and systemic perfusion.    INTUBATION Performed by: Sherwood Gambler T  Required items: required blood products, implants, devices, and special equipment available Patient identity confirmed: provided demographic data and hospital-assigned identification number Time out: Immediately prior to procedure a "time out" was called to verify the correct patient, procedure, equipment, support staff and site/side marked as required.  Indications: respiratory failure, airway protection, post-CPR  Intubation method: Direct Laryngoscopy   Preoxygenation: BVM  Sedatives: Etomidate Paralytic: Rocuronium  Tube Size: 7.5 cuffed  Post-procedure assessment: chest rise and ETCO2 monitor Breath sounds: equal and absent over the epigastrium Tube secured with: ETT holder Chest x-ray interpreted by radiologist and me.  Chest x-ray findings: endotracheal tube in appropriate position  Patient tolerated the procedure well with no immediate complications.      Sherwood Gambler, MD 12/17/15 2251

## 2015-12-17 NOTE — ED Triage Notes (Signed)
Patient here from home with complaints of nausea, vomiting, SOB that started around 9am today. Sat 97% RA. Hx of same.

## 2015-12-17 NOTE — Progress Notes (Signed)
This RN called in to room regarding pt's c/o "I can't breathe". VS: BP 134/71 HR 130's, RR 20, 99% on 3L . Rapid Response RN called in to assess pt.

## 2015-12-17 NOTE — ED Notes (Signed)
PA made aware of patient lactic result. 

## 2015-12-17 NOTE — ED Notes (Signed)
Pt states drank vodka earlier today and is causing her mouth to be dry.

## 2015-12-17 NOTE — ED Provider Notes (Signed)
[facility] DEPT Provider Note   CSN: [id number] Arrival date & time: 12/17/15  1410  First Provider Contact:  None       History   Chief Complaint Chief Complaint  Patient presents with  . Nausea  . Emesis  . Shortness of Breath    HPI [name] is a 55 y.o. female.  HPI 54 y.o. female with a hx of alcohol dependence, ASD repair, depression, presents to the Emergency Department today complaining of N/V and epigastric pain with onset today around 9am. Pt recently admitted for starvation ketoacidosis on 12-06-15 and DCed on 12-07-15. Pt told to refrain from alcohol at discharge. Pt states that x 3 days ago she drank alcohol again. Unsure of amount, but states that it was Vodka. No hx DT or alcoholic seizures. This morning, pt has had several episodes of non bilious emesis. Her gallbladder and appendix are removed. Pt has not had any food x 3 days and has only been drinking alcohol. No fevers. No headaches. Pt has also not taken her home medications for the past three days, which includes carvedilol. No other symptoms noted.     Per Admission Note 12-07-15:  "[name] a 55 y.o.femalewith medical history significant of alcohol dependence, ASD repair, depression. Patient presents to the [facility] emergency department for evaluation of palpitations, nausea, vomiting, and abdominal pain. Symptoms started yesterday and she feels the nausea and vomiting may be associated with her recent use of Norco. She reports 2 episodes of nonbloody, numb bilious emesis. No hematochezia or or melena. She reports having her gallbladder and appendix removed. Pain is achy and mainly epigastric and does not radiate. Her last drink, according to her, was 2 days ago. She states that she generally does not drink when her daughter is around. She is currently trying to quit drinking as she had a significant problem with this in the past. Palpitations resolved.  Patient was admitted to the hospital with  self limiting nausea and vomiting without apparent etiology, possibly related to alcohol abuse and home medications. She underwent a CT scan of the abdomen and pelvis on admission which showed no acute intra-abdominal pathology. Her lipase was normal. Her LFTs were normal. Her nausea and vomiting have resolved, and she is able to eat a regular diet. Patient states that at home every time she drinks alcohol she eats nothing and drank a couple days prior to admission. She was found to be acidotic on admission with a bicarbonate of 11, and the presence of urine ketones strongly suggests starvation ketoacidosis. This resolved with IV fluids, bicarbonate on discharge is 19, recommend follow-up with PCP in about 1-2 weeks for repeat blood work. Discussed extensively with the patient to avoid alcohol completely. She showed no signs of alcohol withdrawal, and she consistently scored a 0 on CIWA scores. She was discharged home in stable condition with close outpatient follow-up."    Past Medical History:  Diagnosis Date  . Allergy    SEASONAL  . Anemia   . Anxiety   . Arthritis   . Cardiomyopathy   . Deaf   . Depression   . Fibromyalgia   . Gastritis   . GERD (gastroesophageal reflux disease)   . Hearing impairment   . Hypertension    [name], take htn medication to regulate heart beat.  . Migraine   . Osteoporosis   . Pulmonary embolism ([place])    occured post c-section of her daughter  . Sleep apnea   .  Vitamin D deficiency     Patient Active Problem List   Diagnosis Date Noted  . Increased anion gap metabolic acidosis [id number]  . Essential hypertension 12/06/2015  . Accidental drug overdose 10/26/2015  . Aspiration pneumonia ([place]) 10/23/2015  . Acute respiratory failure with hypoxia ([place]) 10/23/2015  . Acute kidney injury ([place]) 10/23/2015  . Transaminitis 10/23/2015  . Positive blood culture 10/23/2015  . Elevated troponin 10/23/2015  . Thrombocytopenia ([place]) 10/23/2015  . Normocytic  anemia 10/23/2015  . Shock ([place]) 10/19/2015  . Major depressive disorder, recurrent episode, moderate ([place]) 09/18/2014  . Non-traumatic compression fracture of vertebral column with routine healing 07/17/2014  . Osteoporosis 07/17/2014  . H/O vitamin D deficiency 07/17/2014  . Odynophagia 07/09/2014  . Dysphagia 07/08/2014  . Chest pain 05/20/2014  . Nausea & vomiting 05/20/2014  . 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 Major depression, chronic ([place]) 03/29/2014  . Breath shortness 03/29/2014  . Hypercholesterolemia without hypertriglyceridemia 03/29/2014  . Fatigue 03/29/2014  . Classical migraine with intractable migraine 03/29/2014  . Disorder of mitral valve 03/29/2014  . Atypical migraine 03/29/2014  . Atrial septal defect of fossa ovalis 03/29/2014  . HLD (hyperlipidemia) 03/29/2014  . Arthralgia of multiple joints 03/29/2014  . Anemia, iron deficiency 03/29/2014  . Fibrositis 03/29/2014  . Abdominal pain 12/20/2013  . Absolute anemia 09/20/2013  . Bronchitis 09/20/2013  . Vaginitis and vulvovaginitis 09/20/2013  . Family history of breast cancer 07/13/2013  . Palpitation 07/02/2013  . History of mitral valve repair 07/02/2013  . Status post patch closure of ASD 07/02/2013  . Hyperlipidemia 07/02/2013  . OAB (overactive bladder) 06/13/2013  . Ovarian cyst, left 03/15/2013  . Vaginal lesion 03/15/2013  . Ovarian mass 03/15/2013  . Maternal DVT (deep vein thrombosis), history of 02/26/2013  . PMB (postmenopausal bleeding) 02/26/2013  . Alcohol dependence ([place]) 11/07/2012  . Unspecified vitamin D deficiency 08/17/2012  . L1 vertebral fracture ([place]) 08/01/2012  . Spinal compression fracture ([place]) 07/12/2012  . Menopausal state 07/12/2012  . Psoriasis 07/12/2012  . Major depressive disorder, recurrent episode, severe, without mention of psychotic behavior 12/07/2011  . Generalized anxiety disorder 12/07/2011  . Cardiomyopathy  03/16/2011  . Hearing impairment   . Migraine   . Arthritis     Past Surgical History:  Procedure Laterality Date  . APPENDECTOMY    . CARDIAC SURGERY  January 2007   mitral valve repair  . CARDIAC SURGERY  1973   atrial septal defect  . CARDIAC SURGERY    . COCHLEAR IMPLANT    . INNER EAR SURGERY     TUBES  . LAPAROSCOPIC CHOLECYSTECTOMY  2012  . TONSILLECTOMY AND ADENOIDECTOMY    . TUBAL LIGATION      OB History    Gravida Para Term Preterm AB Living   2 2 2  0 0     SAB TAB Ectopic Multiple Live Births   0 0 0           Home Medications    Prior to Admission medications   Medication Sig Start Date End Date Taking? Authorizing Provider  aspirin 81 MG chewable tablet Chew 81 mg by mouth daily.    Historical Provider, MD  carvedilol (COREG) 25 MG tablet TAKE 1 TABLET BY MOUTH TWICE A DAY WITH MEALS (APPOINTMENT NEEDED FOR ADDITIONAL REFILLS) 12/09/15   Jerline Pain, MD  Cholecalciferol (VITAMIN D3) 2000 units TABS Take 1 tablet by mouth daily.    Historical Provider, MD  diphenoxylate-atropine (LOMOTIL)  2.5-0.025 MG tablet Take 1 tablet by mouth 4 (four) times daily as needed for diarrhea or loose stools. 11/08/15   [name], MD  fexofenadine (ALLEGRA) 180 MG tablet Take 180 mg by mouth daily.    Historical Provider, MD  folic acid (FOLVITE) 1 MG tablet Take 1 mg by mouth daily.    Historical Provider, MD  gabapentin (NEURONTIN) 600 MG tablet Take 600 mg by mouth at bedtime.     Historical Provider, MD  mirabegron ER (MYRBETRIQ) 50 MG TB24 tablet Take 1 tablet (50 mg total) by mouth daily. 07/17/14   Terrance Mass, MD  Multiple Vitamin (MULTIVITAMIN WITH MINERALS) TABS tablet Take 1 tablet by mouth daily.    Historical Provider, MD  Omega-3 Fatty Acids (FISH OIL) 1000 MG CAPS Take 1,000 mg by mouth daily.     Historical Provider, MD  ondansetron (ZOFRAN ODT) 4 MG disintegrating tablet Take 1 tablet (4 mg total) by mouth every 8 (eight) hours as needed for nausea. 11/08/15    [name], MD  oxymetazoline (AFRIN) 0.05 % nasal spray Place 1 spray into both nostrils 2 (two) times daily as needed for congestion.    Historical Provider, MD  pantoprazole (PROTONIX) 40 MG tablet TAKE 1 TABLET (40 MG TOTAL) BY MOUTH DAILY. 03/29/15   Davonna Belling, MD  Vilazodone HCl (VIIBRYD) 40 MG TABS Take 40 mg by mouth daily.    Historical Provider, MD  vitamin B-12 (CYANOCOBALAMIN) 500 MCG tablet Take 500 mcg by mouth daily.    Historical Provider, MD    Family History Family History  Problem Relation Age of Onset  . Breast cancer Mother     bilateral; ages 46 and 22; TAH/BSO ~50  . Depression Sister   . Heart disease Father   . Hypertension Father   . Colon cancer Neg Hx   . Esophageal cancer Neg Hx   . Pancreatic cancer Neg Hx   . Rectal cancer Neg Hx   . Stomach cancer Neg Hx     Social History Social History  Substance Use Topics  . Smoking status: Never Smoker  . Smokeless tobacco: Never Used  . 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     Allergies   Depakote [divalproex sodium]; Diazepam; Valium; and Tizanidine hcl   Review of Systems Review of Systems   Physical Exam Updated Vital Signs BP 127/90 (BP Location: Right Arm)   Pulse 120   Temp 98.6 F (37 C) (Oral)   Resp 21   SpO2 99%   Physical Exam  Constitutional: She is oriented to person, place, and time. Vital signs are normal. She appears well-developed and well-nourished.  Pt ill appearing and sitting up right. Dry heaving. Non tremulous on exam  HENT:  Head: Normocephalic.  Right Ear: Hearing normal.  Left Ear: Hearing normal.  Eyes: Conjunctivae and EOM are normal. Pupils are equal, round, and reactive to light.  Neck: Normal range of motion. Neck supple.  Cardiovascular: Regular rhythm, normal heart sounds and intact distal pulses.  Tachycardia present.   Pulmonary/Chest: Effort normal and breath sounds normal. No respiratory  distress. She has no wheezes. She has no rales. She exhibits no tenderness.  Pt visibly gasping for air. No wheezing noted. Breath sounds normal  Neurological: She is alert and oriented to person, place, and time.  Cranial Nerves:  II: Pupils equal, round, reactive to light III,IV, VI: ptosis not present, extra-ocular  motions intact bilaterally  V,VII: smile symmetric, facial light touch sensation equal VIII: hearing grossly normal bilaterally  IX,X: midline uvula rise  XI: bilateral shoulder shrug equal and strong XII: midline tongue extension  Skin: Skin is warm and dry.  Psychiatric: She has a normal mood and affect. Her speech is normal and behavior is normal. Thought content normal.  Nursing note and vitals reviewed.   ED Treatments / Results  Labs (all labs ordered are listed, but only abnormal results are displayed) Labs Reviewed  CBC - Abnormal; Notable for the following:       Result Value   WBC 20.1 (*)    Platelets 492 (*)    All other components within normal limits  COMPREHENSIVE METABOLIC PANEL - Abnormal; Notable for the following:    Chloride 98 (*)    CO2 7 (*)    Creatinine, Ser 1.23 (*)    Calcium 8.1 (*)    AST 77 (*)    Alkaline Phosphatase 130 (*)    GFR calc non Af Amer 48 (*)    GFR calc Af Amer 56 (*)    Anion gap 35 (*)    All other components within normal limits  ETHANOL - Abnormal; Notable for the following:    Alcohol, Ethyl (B) 140 (*)    All other components within normal limits  I-STAT CG4 LACTIC ACID, ED - Abnormal; Notable for the following:    Lactic Acid, Venous 12.19 (*)    All other components within normal limits  LIPASE, BLOOD  URINALYSIS, ROUTINE W REFLEX MICROSCOPIC (NOT AT [facility])  BLOOD GAS, VENOUS  I-STAT TROPOININ, ED   EKG  EKG Interpretation  Date/Time:  Wednesday December 17 2015 14:24:55 EDT Ventricular Rate:  123 PR Interval:    QRS Duration: 107 QT Interval:  393 QTC Calculation: 563 R Axis:   80 Text  Interpretation:  Sinus tachycardia Poor data quality Confirmed by DELO  MD, DOUGLAS ([id number]) on 12/17/2015 5:02:11 PM      Radiology Dg Chest 2 View  Result Date: 12/17/2015 CLINICAL DATA:  Shortness of breath, nausea and vomiting beginning this morning. EXAM: CHEST  2 VIEW COMPARISON:  PA and lateral chest 07/17/2015. FINDINGS: The lungs are clear. Heart size is normal. The patient is status post mitral valve repair. No pneumothorax or pleural effusion. Remote T12 compression fracture is seen. IMPRESSION: No acute disease. Electronically Signed   By: Inge Rise M.D.   On: 12/17/2015 15:23   Procedures Procedures (including critical care time) CRITICAL CARE Performed by: Ozella Rocks  Total critical care time: 35 minutes  Critical care time was exclusive of separately billable procedures and treating other patients.  Critical care was necessary to treat or prevent imminent or life-threatening deterioration.  Critical care was time spent personally by me on the following activities: development of treatment plan with patient and/or surrogate as well as nursing, discussions with consultants, evaluation of patient's response to treatment, examination of patient, obtaining history from patient or surrogate, ordering and performing treatments and interventions, ordering and review of laboratory studies, ordering and review of radiographic studies, pulse oximetry and re-evaluation of patient's condition.   Medications Ordered in ED Medications - No data to display   Initial Impression / Assessment and Plan / ED Course  I have reviewed the triage vital signs and the nursing notes.  Pertinent labs & imaging results that were available during my care of the patient were reviewed by me and considered in my medical  decision making (see chart for details).  Clinical Course   Final Clinical Impressions(s) / ED Diagnoses  I have reviewed and evaluated the relevant laboratory valuesI have  reviewed and evaluated the relevant imaging studies. I have interpreted the relevant EKG.I have reviewed the relevant previous healthcare records.I have reviewed EMS Documentation.I obtained HPI from historian. Patient discussed with supervising physician  ED Course:  Assessment: Pt is a 58yF with hx alcohol dependence, ASD repair, depression who presents with N/V and epigastric pain since this AM. Hx the same. Recently DCed from hospital on 12-07-15 due to similar and thought to be from starvation ketoacidosis from alcohol use. Pt notes drinking alcohol again x 3 days ago as well as no PO intake besides ETOH. Pt also has not taken home medications for the past three days. On exam, pt in sickly appearing, but sitting up and dry heaving. Non tremulous. VSS but with tachycardia 120s. Non hypertensive. Afebrile. Lungs CTA. Heart RRR. Abdomen with epigastric tenderness. CBC/CMP with leukocytosis 20.1. Anion Gap 35 with CO2 7. iStat lactic acid 12. VBG pending. Lipase negative. iStat Trop negative. Pt without gallbladder or appendix. UA pending. CXR unremarakble. EKG with sinus tachycardia. Given fluids and thiamine in ED as well as Reglan. Emesis controlled. Pt able to tolerate PO. Well appearing and phonating well. Plan is to admit to medicine for likely recurrent starvation ketoacidosis.     Disposition/Plan:  Admit to Medicine   Supervising Physician Veryl Speak, MD   Final diagnoses:  Metabolic acidosis    New Prescriptions New Prescriptions   No medications on file     Shary Decamp, PA-C 12/17/15 1723    Veryl Speak, MD 12/17/15 2047

## 2015-12-17 NOTE — ED Notes (Signed)
Pt was informed about Urinalysis 

## 2015-12-17 NOTE — Consult Note (Signed)
PULMONARY / CRITICAL CARE MEDICINE   Name: [name] MRN: [id number] DOB: [birth year]    ADMISSION DATE:  12/17/2015 CONSULTATION DATE:  12/17/2015  REFERRING MD:  Dr. Clementeen Graham, triad hospitalist  CHIEF COMPLAINT:  Cardiac arrest  HISTORY OF PRESENT ILLNESS:   55 year old female with a past medical history significant for heavy alcohol use was admitted to the [facility] on 12/17/2015 in the setting of feeling poorly for several days. She just been discharged from our facility after treatment for metabolic acidosis, nausea and vomiting in the setting of heavy alcohol use and poor by mouth intake. During that hospitalization she was treated with IV fluids and her symptoms and lab derangements resolved. However, she went home and she drank nothing but alcohol for 3 days per to admission. Her family reports that she did not eat during that time. This evening she was admitted to the hospitalist service and treated with IV fluids. On the floor she was having difficulty with shortness of breath and tachycardia so she was transferred to the intensive care unit. Of note, she had a lactic acid value greater than 10 on admission. Not long after transfer to the intensive care unit she had a cardiac arrest in the setting of the sensation of feeling that she was choking. 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 Chest x-ray after intubation after her cardiac arrest showed evidence of a right lower lobe pneumonia. Her cardiac arrest lasted for 6 minutes. She received CPR with 1 dose of epinephrine. Pulmonary critical care medicine was consulted at the time of cardiac arrest.  PAST MEDICAL HISTORY :  She  has a past medical history of Allergy; Anemia; Anxiety; Arthritis; Cardiomyopathy; Deaf; Depression; Fibromyalgia; Gastritis; GERD (gastroesophageal reflux disease); Hearing impairment; Hypertension; Migraine; Osteoporosis;  Pulmonary embolism ([place]); Sleep apnea; and Vitamin D deficiency.  PAST SURGICAL HISTORY: She  has a past surgical history that includes Appendectomy; Inner ear surgery; Tonsillectomy and adenoidectomy; Tubal ligation; Laparoscopic cholecystectomy (2012); Cardiac surgery (January 2007); Cardiac surgery (1973); Cochlear implant; and Cardiac surgery.  Allergies  Allergen Reactions  . Depakote [Divalproex Sodium] Anaphylaxis  . Diazepam Other (See Comments)    Died on operating table  . Valium Anaphylaxis  . Tizanidine Hcl Other (See Comments)    "passed out"    No current facility-administered medications on file prior to encounter.    Current Outpatient Prescriptions on File Prior to Encounter  Medication Sig  . aspirin 81 MG chewable tablet Chew 81 mg by mouth daily.  . carvedilol (COREG) 25 MG tablet TAKE 1 TABLET BY MOUTH TWICE A DAY WITH MEALS (APPOINTMENT NEEDED FOR ADDITIONAL REFILLS)  . Cholecalciferol (VITAMIN D3) 2000 units TABS Take 1 tablet by mouth daily.  . diphenoxylate-atropine (LOMOTIL) 2.5-0.025 MG tablet Take 1 tablet by mouth 4 (four) times daily as needed for diarrhea or loose stools.  . fexofenadine (ALLEGRA) 180 MG tablet Take 180 mg by mouth daily.  . folic acid (FOLVITE) 1 MG tablet Take 1 mg by mouth daily.  [name] gabapentin (NEURONTIN) 600 MG tablet Take 600 mg by mouth at bedtime.   . mirabegron ER (MYRBETRIQ) 50 MG TB24 tablet Take 1 tablet (50 mg total) by mouth daily.  . Omega-3 Fatty Acids (FISH OIL) 1000 MG CAPS Take 1,000 mg by mouth 2 (two) times daily.   . ondansetron (ZOFRAN ODT) 4 MG disintegrating tablet Take 1 tablet (4 mg total) by mouth every 8 (  eight) hours as needed for nausea.  . pantoprazole (PROTONIX) 40 MG tablet TAKE 1 TABLET (40 MG TOTAL) BY MOUTH DAILY.  . Vilazodone HCl (VIIBRYD) 40 MG TABS Take 40 mg by mouth daily.  . vitamin B-12 (CYANOCOBALAMIN) 500 MCG tablet Take 500 mcg by mouth daily.    FAMILY HISTORY:  Her indicated that her  mother is alive. She indicated that her father is deceased. She indicated that her sister is alive. She indicated that the status of her neg hx is unknown.    SOCIAL HISTORY: She  reports that she has never smoked. She has never used smokeless tobacco. She reports that she drinks alcohol. She reports that she does not use drugs.  REVIEW OF SYSTEMS:   Cannot obtain due to intubation  SUBJECTIVE:  As above  VITAL SIGNS: BP (!) 152/73   Pulse (!) 143   Temp 98.5 F (36.9 C) (Oral)   Resp 18   SpO2 98%   HEMODYNAMICS:    VENTILATOR SETTINGS: Vent Mode: PRVC FiO2 (%):  [100 %] 100 % Set Rate:  [24 bmp] 24 bmp Vt Set:  [460 mL] 460 mL PEEP:  [5 cmH20] 5 cmH20 Plateau Pressure:  [15 cmH20] 15 cmH20  INTAKE / OUTPUT: No intake/output data recorded.  PHYSICAL EXAMINATION: General:  Chronically ill-appearing female, sedated and paralyzed on vent Neuro:  Sedated heavily, paralyzed on vent HEENT:  Normocephalic atraumatic, pupils equal round reactive to light, endotracheal tube is in place Cardiovascular:  Tachycardic, no murmurs gallops or rubs Lungs:  Rhonchi right lower lobe, diminished right lower lobe, vent supported breaths, otherwise clear Abdomen:  Belly mildly distended, nontender nondistended Musculoskeletal:  Normal bulk and tone Skin:  Diaphoretic, cool to the touch, some mottling and extremities  LABS:  BMET  Recent Labs Lab 12/17/15 1544 12/17/15 2036 12/17/15 2235  NA 140 139 141  K 3.8 4.3 6.0*  CL 98* 106 113*  CO2 7* 8* <7*  BUN 17 14 15   CREATININE 1.23* 1.17* 1.04*  GLUCOSE 76 92 146*    Electrolytes  Recent Labs Lab 12/17/15 1544 12/17/15 1600 12/17/15 2036 12/17/15 2235  CALCIUM 8.1*  --  6.7* 6.4*  MG  --  2.2  --  2.0  PHOS  --   --   --  4.4    CBC  Recent Labs Lab 12/17/15 1544  WBC 20.1*  HGB 13.7  HCT 42.8  PLT 492*    Coag's No results for input(s): APTT, INR in the last 168 hours.  Sepsis Markers  Recent  Labs Lab 12/17/15 1704 12/17/15 2036 12/17/15 2235  LATICACIDVEN 12.19* 6.3* 9.8*    ABG No results for input(s): PHART, PCO2ART, PO2ART in the last 168 hours.  Liver Enzymes  Recent Labs Lab 12/17/15 1544  AST 77*  ALT 34  ALKPHOS 130*  BILITOT 0.4  ALBUMIN 4.8    Cardiac Enzymes  Recent Labs Lab 12/17/15 2235  TROPONINI 0.03*    Glucose No results for input(s): GLUCAP in the last 168 hours.  Imaging Dg Chest 2 View  Result Date: 12/17/2015 CLINICAL DATA:  Shortness of breath, nausea and vomiting beginning this morning. EXAM: CHEST  2 VIEW COMPARISON:  PA and lateral chest 07/17/2015. FINDINGS: The lungs are clear. Heart size is normal. The patient is status post mitral valve repair. No pneumothorax or pleural effusion. Remote T12 compression fracture is seen. IMPRESSION: No acute disease. Electronically Signed   By: Inge Rise M.D.   On: 12/17/2015 15:23  Dg Chest Port 1 View  Result Date: 12/17/2015 CLINICAL DATA:  CPR and intubation. EXAM: PORTABLE CHEST 1 VIEW COMPARISON:  12/17/2015. FINDINGS: Endotracheal tube is now identified with tip 2.3 cm above the carina. New dense consolidation/opacity throughout the medial right mid and lower lung noted. There is no evidence of pleural effusion or pneumothorax. Defibrillator pad overlying the lower chest noted. Cardiomegaly and cardiac valve replacement again identified. IMPRESSION: Endotracheal tube with tip 2.3 cm above the carina. New dense consolidations/opacity within the medial right mid and lower lung. Electronically Signed   By: Margarette Canada M.D.   On: 12/17/2015 23:00     STUDIES:    CULTURES: 12/17/2015 respiratory culture  ANTIBIOTICS: 12/17/2015 Unasyn  SIGNIFICANT EVENTS: 12/17/2015 admission to the hospitalist service later followed by cardiac arrest  LINES/TUBES: 12/17/2015 endotracheal tube 12/17/2015 left internal jugular central venous catheter  DISCUSSION: 55 year old female with a  past medical history significant for heavy alcohol abuse is admitted on 12/17/2015 with a severe combined metabolic acidosis in the setting of ketoacidosis and lactic acidosis. The presumed diagnosis on admission was recurrent starvation acidosis. However she was also noted to have a significant lactic acidosis which was likely secondary to volume depletion in the setting of significant nausea and vomiting and no by mouth intake. Not long after admission she was admitted to the intensive care unit where she had a cardiac arrest which is due to an aspiration pneumonia. She likely aspirated secondary to esophageal dysmotility. She is now critically ill in the intensive care unit.  ASSESSMENT / PLAN:  PULMONARY A: Aspiration pneumonia Acute respiratory failure with hypoxemia Obstructive sleep apnea P:   Full ventilatory support Ventilator associated pneumonia prevention protocol Chest x-ray daily ABG noted, metabolic acidosis so will adjust high respiratory rate help compensate overnight  CARDIOVASCULAR A:  Sinus tachycardia in the setting of acute respiratory therapy hypoxemia History of "cardiomyopathy but echocardiogram in June 2017 showed a normal LVEF, mild dilation of the right side with an RVSP in the 40 range Lactic acidosis, presumably due to volume depletion and now hypoxemia postcardiac arrest P:  Telemetry monitoring CVP monitoring Continue IV fluid resuscitation with sodium bicarbonate  RENAL A:   Severe metabolic acidosis, anion gap positive secondary to ketoacidosis and lactic acidosis Acute kidney injury Hyperkalemia due to metabolic acidosis P:   Repeat basic metabolic panel Sodium bicarbonate drip Repeat ABG Continue IV fluid resuscitation with bicarbonate drip  GASTROINTESTINAL A:   Mildly elevated AST, likely consistent with alcoholic hepatitis P:   Place OG tube Start tube feedings, watch closely for refeeding syndrome, follow magnesium and phosphorus  closely  HEMATOLOGIC A:   No acute issues P:  Monitor for bleeding  INFECTIOUS A:   Aspiration pneumonia P:   Respiratory culture Start Unasyn  ENDOCRINE A:   At risk for hypoglycemia P:   Key 4 hour blood sugar checks  NEUROLOGIC A:   Acute encephalopathy in the setting of hypoxemia At risk for alcohol withdrawal Heavy alcohol abuse P:   RASS goal: -1 PAD protocol with when necessary fentanyl and Precedex Thiamine Folate    FAMILY  - Updates: mother, son updated by me at bedside this evening  - Inter-disciplinary family meet or Palliative Care meeting due by:  day 7  My cc time outside of procedures 50 minutes  Roselie Awkward, MD [facility] Pager: [phone] Cell: [phone] After 3pm or if no response, call [phone]  12/17/2015, 11:32 PM

## 2015-12-17 NOTE — Procedures (Signed)
Central Venous Catheter Insertion Procedure Note AZIANA DALL [id number] [birth year]  Procedure: Insertion of Central Venous Catheter Indications: Assessment of intravascular volume and Drug and/or fluid administration  Procedure Details Consent: Risks of procedure as well as the alternatives and risks of each were explained to the (patient/caregiver).  Consent for procedure obtained. Time Out: Verified patient identification, verified procedure, site/side was marked, verified correct patient position, special equipment/implants available, medications/allergies/relevent history reviewed, required imaging and test results available.  Performed  Maximum sterile technique was used including antiseptics, cap, gloves, gown, hand hygiene, mask and sheet. Skin prep: Chlorhexidine; local anesthetic administered A antimicrobial bonded/coated triple lumen catheter was placed in the left internal jugular vein using the Seldinger technique.  Ultrasound was used to verify the patency of the vein and for real time needle guidance.  Evaluation Blood flow good Complications: No apparent complications Patient did tolerate procedure well. Chest X-ray ordered to verify placement.  CXR: pending.  Simonne Maffucci 12/17/2015, 11:30 PM

## 2015-12-17 NOTE — ED Notes (Signed)
Patient transported to X-ray 

## 2015-12-18 ENCOUNTER — Observation Stay (HOSPITAL_COMMUNITY): Payer: Medicare Other

## 2015-12-18 DIAGNOSIS — I214 Non-ST elevation (NSTEMI) myocardial infarction: Secondary | ICD-10-CM | POA: Diagnosis not present

## 2015-12-18 DIAGNOSIS — E1310 Other specified diabetes mellitus with ketoacidosis without coma: Secondary | ICD-10-CM | POA: Diagnosis not present

## 2015-12-18 DIAGNOSIS — R571 Hypovolemic shock: Secondary | ICD-10-CM | POA: Diagnosis not present

## 2015-12-18 DIAGNOSIS — J189 Pneumonia, unspecified organism: Secondary | ICD-10-CM | POA: Diagnosis not present

## 2015-12-18 DIAGNOSIS — Z452 Encounter for adjustment and management of vascular access device: Secondary | ICD-10-CM | POA: Diagnosis not present

## 2015-12-18 DIAGNOSIS — I483 Typical atrial flutter: Secondary | ICD-10-CM | POA: Diagnosis present

## 2015-12-18 DIAGNOSIS — F1010 Alcohol abuse, uncomplicated: Secondary | ICD-10-CM | POA: Diagnosis not present

## 2015-12-18 DIAGNOSIS — E872 Acidosis: Secondary | ICD-10-CM | POA: Diagnosis not present

## 2015-12-18 DIAGNOSIS — J690 Pneumonitis due to inhalation of food and vomit: Secondary | ICD-10-CM | POA: Diagnosis not present

## 2015-12-18 DIAGNOSIS — R918 Other nonspecific abnormal finding of lung field: Secondary | ICD-10-CM | POA: Diagnosis not present

## 2015-12-18 DIAGNOSIS — Z4682 Encounter for fitting and adjustment of non-vascular catheter: Secondary | ICD-10-CM | POA: Diagnosis not present

## 2015-12-18 DIAGNOSIS — Y906 Blood alcohol level of 120-199 mg/100 ml: Secondary | ICD-10-CM | POA: Diagnosis present

## 2015-12-18 DIAGNOSIS — T17990A Other foreign object in respiratory tract, part unspecified in causing asphyxiation, initial encounter: Secondary | ICD-10-CM | POA: Diagnosis not present

## 2015-12-18 DIAGNOSIS — J9811 Atelectasis: Secondary | ICD-10-CM | POA: Diagnosis not present

## 2015-12-18 DIAGNOSIS — R112 Nausea with vomiting, unspecified: Secondary | ICD-10-CM | POA: Diagnosis present

## 2015-12-18 DIAGNOSIS — E876 Hypokalemia: Secondary | ICD-10-CM | POA: Diagnosis not present

## 2015-12-18 DIAGNOSIS — I10 Essential (primary) hypertension: Secondary | ICD-10-CM | POA: Diagnosis not present

## 2015-12-18 DIAGNOSIS — I429 Cardiomyopathy, unspecified: Secondary | ICD-10-CM | POA: Diagnosis not present

## 2015-12-18 DIAGNOSIS — E43 Unspecified severe protein-calorie malnutrition: Secondary | ICD-10-CM | POA: Insufficient documentation

## 2015-12-18 DIAGNOSIS — I469 Cardiac arrest, cause unspecified: Secondary | ICD-10-CM | POA: Diagnosis not present

## 2015-12-18 DIAGNOSIS — D6959 Other secondary thrombocytopenia: Secondary | ICD-10-CM | POA: Diagnosis present

## 2015-12-18 DIAGNOSIS — R578 Other shock: Secondary | ICD-10-CM | POA: Diagnosis not present

## 2015-12-18 DIAGNOSIS — I4581 Long QT syndrome: Secondary | ICD-10-CM | POA: Diagnosis present

## 2015-12-18 DIAGNOSIS — L8990 Pressure ulcer of unspecified site, unspecified stage: Secondary | ICD-10-CM

## 2015-12-18 DIAGNOSIS — J181 Lobar pneumonia, unspecified organism: Secondary | ICD-10-CM | POA: Diagnosis not present

## 2015-12-18 DIAGNOSIS — F329 Major depressive disorder, single episode, unspecified: Secondary | ICD-10-CM | POA: Diagnosis present

## 2015-12-18 DIAGNOSIS — R1310 Dysphagia, unspecified: Secondary | ICD-10-CM | POA: Diagnosis not present

## 2015-12-18 DIAGNOSIS — K7010 Alcoholic hepatitis without ascites: Secondary | ICD-10-CM | POA: Diagnosis present

## 2015-12-18 DIAGNOSIS — T730XXA Starvation, initial encounter: Secondary | ICD-10-CM | POA: Diagnosis present

## 2015-12-18 DIAGNOSIS — F10239 Alcohol dependence with withdrawal, unspecified: Secondary | ICD-10-CM | POA: Diagnosis not present

## 2015-12-18 DIAGNOSIS — E873 Alkalosis: Secondary | ICD-10-CM | POA: Diagnosis present

## 2015-12-18 DIAGNOSIS — G9341 Metabolic encephalopathy: Secondary | ICD-10-CM | POA: Diagnosis not present

## 2015-12-18 DIAGNOSIS — L89151 Pressure ulcer of sacral region, stage 1: Secondary | ICD-10-CM | POA: Diagnosis present

## 2015-12-18 DIAGNOSIS — N179 Acute kidney failure, unspecified: Secondary | ICD-10-CM | POA: Diagnosis not present

## 2015-12-18 DIAGNOSIS — D61818 Other pancytopenia: Secondary | ICD-10-CM | POA: Diagnosis not present

## 2015-12-18 DIAGNOSIS — X58XXXA Exposure to other specified factors, initial encounter: Secondary | ICD-10-CM | POA: Diagnosis present

## 2015-12-18 DIAGNOSIS — J9601 Acute respiratory failure with hypoxia: Secondary | ICD-10-CM | POA: Diagnosis not present

## 2015-12-18 DIAGNOSIS — E860 Dehydration: Secondary | ICD-10-CM | POA: Diagnosis present

## 2015-12-18 HISTORY — DX: Pressure ulcer of unspecified site, unspecified stage: L89.90

## 2015-12-18 LAB — BLOOD GAS, ARTERIAL
Acid-base deficit: 22.7 mmol/L — ABNORMAL HIGH (ref 0.0–2.0)
Acid-base deficit: 4.1 mmol/L — ABNORMAL HIGH (ref 0.0–2.0)
BICARBONATE: 6 meq/L — AB (ref 20.0–24.0)
Bicarbonate: 17.8 mEq/L — ABNORMAL LOW (ref 20.0–24.0)
DRAWN BY: 27407
Drawn by: 345601
FIO2: 1
FIO2: 0.6
LHR: 35 {breaths}/min
MECHVT: 460 mL
O2 Saturation: 98.5 %
O2 Saturation: 100 %
PATIENT TEMPERATURE: 98.6
PATIENT TEMPERATURE: 98.6
PCO2 ART: 24.7 mmHg — AB (ref 35.0–45.0)
PEEP: 5 cmH2O
PEEP: 5 cmH2O
PO2 ART: 219 mmHg — AB (ref 80.0–100.0)
RATE: 35 resp/min
TCO2: 6 mmol/L (ref 0–100)
TCO2: 18.5 mmol/L (ref 0–100)
VT: 460 mL
pCO2 arterial: 20.7 mmHg — ABNORMAL LOW (ref 35.0–45.0)
pH, Arterial: 7.089 — CL (ref 7.350–7.450)
pH, Arterial: 7.471 — ABNORMAL HIGH (ref 7.350–7.450)
pO2, Arterial: 154 mmHg — ABNORMAL HIGH (ref 80.0–100.0)

## 2015-12-18 LAB — GLUCOSE, CAPILLARY
GLUCOSE-CAPILLARY: 311 mg/dL — AB (ref 65–99)
GLUCOSE-CAPILLARY: 409 mg/dL — AB (ref 65–99)
GLUCOSE-CAPILLARY: 394 mg/dL — AB (ref 65–99)
GLUCOSE-CAPILLARY: 399 mg/dL — AB (ref 65–99)
GLUCOSE-CAPILLARY: 410 mg/dL — AB (ref 65–99)
GLUCOSE-CAPILLARY: 398 mg/dL — AB (ref 65–99)
GLUCOSE-CAPILLARY: 366 mg/dL — AB (ref 65–99)
GLUCOSE-CAPILLARY: 129 mg/dL — AB (ref 65–99)
GLUCOSE-CAPILLARY: 171 mg/dL — AB (ref 65–99)
Glucose-Capillary: 298 mg/dL — ABNORMAL HIGH (ref 65–99)
Glucose-Capillary: 206 mg/dL — ABNORMAL HIGH (ref 65–99)
Glucose-Capillary: 301 mg/dL — ABNORMAL HIGH (ref 65–99)
Glucose-Capillary: 162 mg/dL — ABNORMAL HIGH (ref 65–99)
Glucose-Capillary: 139 mg/dL — ABNORMAL HIGH (ref 65–99)
Glucose-Capillary: 169 mg/dL — ABNORMAL HIGH (ref 65–99)

## 2015-12-18 LAB — BASIC METABOLIC PANEL
ANION GAP: 13 (ref 5–15)
ANION GAP: 9 (ref 5–15)
Anion gap: 18 — ABNORMAL HIGH (ref 5–15)
Anion gap: 16 — ABNORMAL HIGH (ref 5–15)
BUN: 20 mg/dL (ref 6–20)
BUN: 20 mg/dL (ref 6–20)
BUN: 21 mg/dL — ABNORMAL HIGH (ref 6–20)
BUN: 19 mg/dL (ref 6–20)
CALCIUM: 6.4 mg/dL — AB (ref 8.9–10.3)
CALCIUM: 6.7 mg/dL — AB (ref 8.9–10.3)
CHLORIDE: 106 mmol/L (ref 101–111)
CO2: 11 mmol/L — AB (ref 22–32)
CO2: 16 mmol/L — ABNORMAL LOW (ref 22–32)
CO2: 21 mmol/L — AB (ref 22–32)
CO2: 25 mmol/L (ref 22–32)
CREATININE: 1.68 mg/dL — AB (ref 0.44–1.00)
Calcium: 6.5 mg/dL — ABNORMAL LOW (ref 8.9–10.3)
Calcium: 6.3 mg/dL — CL (ref 8.9–10.3)
Chloride: 103 mmol/L (ref 101–111)
Chloride: 103 mmol/L (ref 101–111)
Chloride: 105 mmol/L (ref 101–111)
Creatinine, Ser: 1.8 mg/dL — ABNORMAL HIGH (ref 0.44–1.00)
Creatinine, Ser: 1.61 mg/dL — ABNORMAL HIGH (ref 0.44–1.00)
Creatinine, Ser: 1.37 mg/dL — ABNORMAL HIGH (ref 0.44–1.00)
GFR calc Af Amer: 39 mL/min — ABNORMAL LOW (ref >60)
GFR calc Af Amer: 35 mL/min — ABNORMAL LOW (ref >60)
GFR calc non Af Amer: 33 mL/min — ABNORMAL LOW (ref >60)
GFR calc non Af Amer: 31 mL/min — ABNORMAL LOW (ref >60)
GFR calc non Af Amer: 43 mL/min — ABNORMAL LOW (ref >60)
GFR, EST AFRICAN AMERICAN: 41 mL/min — AB (ref >60)
GFR, EST AFRICAN AMERICAN: 49 mL/min — AB (ref >60)
GFR, EST NON AFRICAN AMERICAN: 35 mL/min — AB (ref >60)
GLUCOSE: 347 mg/dL — AB (ref 65–99)
GLUCOSE: 284 mg/dL — AB (ref 65–99)
GLUCOSE: 147 mg/dL — AB (ref 65–99)
Glucose, Bld: 410 mg/dL — ABNORMAL HIGH (ref 65–99)
POTASSIUM: 2.7 mmol/L — AB (ref 3.5–5.1)
POTASSIUM: 2.9 mmol/L — AB (ref 3.5–5.1)
Potassium: 4.1 mmol/L (ref 3.5–5.1)
Potassium: 3.4 mmol/L — ABNORMAL LOW (ref 3.5–5.1)
Sodium: 135 mmol/L (ref 135–145)
Sodium: 135 mmol/L (ref 135–145)
Sodium: 137 mmol/L (ref 135–145)
Sodium: 139 mmol/L (ref 135–145)

## 2015-12-18 LAB — LACTIC ACID, PLASMA
LACTIC ACID, VENOUS: 5.3 mmol/L — AB (ref 0.5–1.9)
Lactic Acid, Venous: 2 mmol/L (ref 0.5–1.9)
Lactic Acid, Venous: 5.9 mmol/L (ref 0.5–1.9)

## 2015-12-18 LAB — CBC
HEMATOCRIT: 30.4 % — AB (ref 36.0–46.0)
HEMOGLOBIN: 10.1 g/dL — AB (ref 12.0–15.0)
MCH: 32 pg (ref 26.0–34.0)
MCHC: 33.2 g/dL (ref 30.0–36.0)
MCV: 96.2 fL (ref 78.0–100.0)
Platelets: 217 10*3/uL (ref 150–400)
RBC: 3.16 MIL/uL — ABNORMAL LOW (ref 3.87–5.11)
RDW: 15.5 % (ref 11.5–15.5)
WBC: 5.6 10*3/uL (ref 4.0–10.5)

## 2015-12-18 LAB — BETA-HYDROXYBUTYRIC ACID: BETA-HYDROXYBUTYRIC ACID: 2.72 mmol/L — AB (ref 0.05–0.27)

## 2015-12-18 LAB — TROPONIN I
TROPONIN I: 0.1 ng/mL — AB (ref <0.03)
Troponin I: 7.89 ng/mL (ref <0.03)

## 2015-12-18 LAB — MAGNESIUM
Magnesium: 1.8 mg/dL (ref 1.7–2.4)
Magnesium: 1.4 mg/dL — ABNORMAL LOW (ref 1.7–2.4)

## 2015-12-18 LAB — ETHYLENE GLYCOL: Ethylene Glycol Lvl: NOT DETECTED mg/dL

## 2015-12-18 LAB — OSMOLALITY, URINE: Osmolality, Ur: 407 mOsm/kg (ref 300–900)

## 2015-12-18 LAB — MRSA PCR SCREENING: MRSA BY PCR: POSITIVE — AB

## 2015-12-18 LAB — PHOSPHORUS
PHOSPHORUS: 3.9 mg/dL (ref 2.5–4.6)
Phosphorus: <0.1 mg/dL — CL (ref 2.5–4.6)

## 2015-12-18 MED ORDER — SODIUM PHOSPHATES 45 MMOLE/15ML IV SOLN
30.0000 mmol | Freq: Once | INTRAVENOUS | Status: AC
  Administered 2015-12-18: 30 mmol via INTRAVENOUS
  Filled 2015-12-18: qty 10

## 2015-12-18 MED ORDER — NOREPINEPHRINE BITARTRATE 1 MG/ML IV SOLN
2.0000 ug/min | INTRAVENOUS | Status: DC
  Administered 2015-12-18: 5 ug/min via INTRAVENOUS
  Administered 2015-12-18: 10 ug/min via INTRAVENOUS
  Administered 2015-12-18: 12 ug/min via INTRAVENOUS
  Administered 2015-12-18: 9 ug/min via INTRAVENOUS
  Filled 2015-12-18 (×3): qty 4

## 2015-12-18 MED ORDER — INSULIN ASPART 100 UNIT/ML ~~LOC~~ SOLN
18.0000 [IU] | SUBCUTANEOUS | Status: AC
  Administered 2015-12-18: 18 [IU] via SUBCUTANEOUS

## 2015-12-18 MED ORDER — FOLIC ACID 1 MG PO TABS
1.0000 mg | ORAL_TABLET | Freq: Every day | ORAL | Status: DC
  Administered 2015-12-18 – 2015-12-19 (×2): 1 mg
  Filled 2015-12-18 (×2): qty 1

## 2015-12-18 MED ORDER — DEXTROSE 50 % IV SOLN
INTRAVENOUS | Status: AC
  Administered 2015-12-18: 50 mL
  Filled 2015-12-18: qty 50

## 2015-12-18 MED ORDER — ASPIRIN 81 MG PO CHEW
81.0000 mg | CHEWABLE_TABLET | Freq: Every day | ORAL | Status: DC
  Administered 2015-12-18 – 2015-12-26 (×7): 81 mg
  Filled 2015-12-18 (×6): qty 1

## 2015-12-18 MED ORDER — INSULIN ASPART 100 UNIT/ML ~~LOC~~ SOLN: 0.0000 [IU] | SUBCUTANEOUS | Status: DC

## 2015-12-18 MED ORDER — POTASSIUM CHLORIDE 20 MEQ/15ML (10%) PO SOLN
40.0000 meq | ORAL | Status: AC
  Administered 2015-12-18 (×2): 40 meq
  Filled 2015-12-18 (×2): qty 30

## 2015-12-18 MED ORDER — SODIUM CHLORIDE 0.9 % IV SOLN
2.0000 g | Freq: Once | INTRAVENOUS | Status: AC
  Administered 2015-12-18: 2 g via INTRAVENOUS
  Filled 2015-12-18: qty 20

## 2015-12-18 MED ORDER — INSULIN ASPART 100 UNIT/ML IV SOLN
10.0000 [IU] | Freq: Once | INTRAVENOUS | Status: AC
  Administered 2015-12-18: 10 [IU] via INTRAVENOUS

## 2015-12-18 MED ORDER — DEXTROSE 50 % IV SOLN
1.0000 | Freq: Once | INTRAVENOUS | Status: AC
  Administered 2015-12-18: 50 mL via INTRAVENOUS

## 2015-12-18 MED ORDER — NOREPINEPHRINE BITARTRATE 1 MG/ML IV SOLN
2.0000 ug/min | INTRAVENOUS | Status: DC
  Administered 2015-12-18: 9 ug/min via INTRAVENOUS
  Filled 2015-12-18: qty 16

## 2015-12-18 MED ORDER — SODIUM CHLORIDE 0.9% FLUSH
10.0000 mL | INTRAVENOUS | Status: DC | PRN
  Administered 2015-12-22: 20 mL
  Administered 2015-12-22 (×2): 10 mL
  Administered 2015-12-23 – 2015-12-24 (×2): 20 mL
  Filled 2015-12-18 (×5): qty 40

## 2015-12-18 MED ORDER — ADULT MULTIVITAMIN LIQUID CH
15.0000 mL | Freq: Every day | ORAL | Status: DC
  Administered 2015-12-18 – 2015-12-19 (×2): 15 mL
  Filled 2015-12-18 (×3): qty 15

## 2015-12-18 MED ORDER — SODIUM CHLORIDE 0.9 % IV SOLN
INTRAVENOUS | Status: DC
  Administered 2015-12-18: 3.4 [IU]/h via INTRAVENOUS
  Filled 2015-12-18: qty 2.5

## 2015-12-18 MED ORDER — MUPIROCIN 2 % EX OINT
1.0000 "application " | TOPICAL_OINTMENT | Freq: Two times a day (BID) | CUTANEOUS | Status: AC
  Administered 2015-12-18 – 2015-12-22 (×10): 1 via NASAL
  Filled 2015-12-18 (×3): qty 22

## 2015-12-18 MED ORDER — CHLORHEXIDINE GLUCONATE CLOTH 2 % EX PADS
6.0000 | MEDICATED_PAD | Freq: Every day | CUTANEOUS | Status: AC
  Administered 2015-12-18 – 2015-12-22 (×5): 6 via TOPICAL

## 2015-12-18 MED ORDER — VITAMIN B-1 100 MG PO TABS
100.0000 mg | ORAL_TABLET | Freq: Every day | ORAL | Status: DC
  Administered 2015-12-18 – 2015-12-19 (×2): 100 mg
  Filled 2015-12-18 (×2): qty 1

## 2015-12-18 MED ORDER — SODIUM CHLORIDE 0.9 % IV BOLUS (SEPSIS)
1000.0000 mL | Freq: Once | INTRAVENOUS | Status: AC
  Administered 2015-12-18: 1000 mL via INTRAVENOUS

## 2015-12-18 MED ORDER — MAGNESIUM SULFATE 4 GM/100ML IV SOLN
4.0000 g | Freq: Once | INTRAVENOUS | Status: AC
  Administered 2015-12-18: 4 g via INTRAVENOUS
  Filled 2015-12-18: qty 100

## 2015-12-18 MED ORDER — FENTANYL CITRATE (PF) 100 MCG/2ML IJ SOLN
25.0000 ug | INTRAMUSCULAR | Status: DC | PRN
  Administered 2015-12-18: 100 ug via INTRAVENOUS
  Administered 2015-12-18: 25 ug via INTRAVENOUS
  Administered 2015-12-19: 50 ug via INTRAVENOUS
  Administered 2015-12-19: 25 ug via INTRAVENOUS
  Filled 2015-12-18 (×3): qty 2

## 2015-12-18 MED ORDER — GLUCERNA 1.2 CAL PO LIQD
1000.0000 mL | ORAL | Status: DC
  Administered 2015-12-18: 1000 mL
  Filled 2015-12-18 (×3): qty 1000

## 2015-12-18 NOTE — Significant Event (Signed)
Rapid Response Event Note  Overview: Time Called: 1935 Arrival Time: 1945 Event Type: Respiratory, Unknown  Initial Focused Assessment:Patient sitting up in bed alert and very HOH. C/O of not  being able to breathe. Pt. Very anxious and able to talk with sats 98-100%. Her mother is at bedside and states she has paralysis of her esophagus. The patient is taking ice chips tolerating well.  She has upper lobe wheezes and coarse bases. No stridor. V/S stable with exception ST 130's. When she is relaxed respirations are 16-20.  She was c/o burning from her stomach to her throat and nausea. Constant reasurrance that her meds were on the way and she was upset when they did not help immediately.    Interventions:Toradol and zofran. Transferred to stepdown 1225 for closer observation. RT assessed patient.   Plan of Care (if not transferred):  Event Summary: Name of Physician Notified: Forrest Moron NP at 2000    at    Outcome: Transferred (Comment) (1225)  Event End Time: 2130  Pricilla Riffle

## 2015-12-18 NOTE — Progress Notes (Signed)
[facility] Progress Note Patient Name: [name] DOB: May 23, 1960 MRN: [id number]   Date of Service  12/18/2015  HPI/Events of Note  Ca++ = 6.3 and albumin = 4.8.  eICU Interventions  Will replete Ca++.     Intervention Category Intermediate Interventions: Electrolyte abnormality - evaluation and management  Connelly [place] 12/18/2015, 9:04 PM

## 2015-12-18 NOTE — Progress Notes (Signed)
CRITICAL VALUE ALERT  Critical value received:  Lactic acid 9.8                                        Calcium 6.4                                        Potassium 6.0                                        Troponin 0.03  Date of notification:  12/17/15  Time of notification: 2320  Critical value read back:Yes.    Nurse who received alert:  Darrin Nipper RN  MD notified (1st page):  Mcquaid on unit  Time of first page: 2330

## 2015-12-18 NOTE — Progress Notes (Signed)
CRITICAL VALUE ALERT  Critical value received:  Lactic acid 2.0  Date of notification:  12/18/15  Time of notification: 0330  Critical value read back:Yes.    Nurse who received alert:  Darrin Nipper, RN  MD notified (1st page):  [place]  Time of first page: 0330

## 2015-12-18 NOTE — Progress Notes (Signed)
Initial Nutrition Assessment  DOCUMENTATION CODES:   Severe malnutrition in context of acute illness/injury  INTERVENTION:  - Will order Glucerna 1.2 @ 30 mL/hr with 30 mL Prostat BID. This regimen + kcal from current IVF will provide 1574 kcal, 73 grams of protein, and 580 mL free water + fluid from IVF.  - RD will follow-up 8/11.  NUTRITION DIAGNOSIS:   Inadequate oral intake related to inability to eat as evidenced by NPO status.  GOAL:   Patient will meet greater than or equal to 90% of their needs  MONITOR:   Vent status, TF tolerance, Weight trends, Labs, I & O's  REASON FOR ASSESSMENT:   Ventilator, Consult Enteral/tube feeding initiation and management  ASSESSMENT:   55 y.o. female with alcohol abuse, major depression, hypertension, hearing impairment who was recently hospitalized with increased anion gap metabolic acidosis likely in the setting of alcohol abuse with starvation ketoacidosis and poor by mouth intake. Patient lives with her daughter and drinks almost regularly. For past few days she has been drinking vodka and not eating or drinking any other fluids. Since started having nausea vomiting with epigastric pain this morning. Denied hematemesis, tremors or seizures. Denied any headaches, fevers, chills, chest pain, palpitations or shortness of breath. Patient informed that she had not eaten anything for past 3 days and also not taken her medications.   Pt seen for new vent and TF consult. BMI indicates normal weight. No family/visitors present at this time. Pt with hx of alcohol abuse and noted that she was drinking, not eating any food, for 3 days PTA. Question if pt is at risk for refeeding given substance abuse hx and possible other times of poor food intake PTA. Noted she was recently hospitalized but no meal completions documented from that time.   Pt was intubated ~2245 on 8/9 following period of anxiety and unresponsiveness.   Patient is currently intubated  on ventilator support with OGT in place.  MV: 15.9 L/min Temp (24hrs), Avg:98.4 F (36.9 C), Min:97.7 F (36.5 C), Max:98.7 F (37.1 C) Propofol: none  Pt currently receiving TF per protocol: Vital High Protein @ 40 mL/hr with 30 mL Prostat BID which provides 1160 kcal, 114 grams of protein, and 803 mL free water. Will order TF as outlined above and follow-up tomorrow. Per chart review, pt has lost 13 lbs (9% body weight) in the past 1 month which is significant for time frame. Physical assessment shows no muscle or fat wasting at this time. Pt with bilateral mitten restraints in place and cast to R lower arm.   Medications reviewed; 1 g IV Ca gluconate x1 dose yesterday, 1 mg folic acid/day, sliding scale Novolog, 10 units Novolog x1 dose today, 15 mL multivitamin/day, 100 mg thiamine/day.  Labs reviewed; CBGs: 311 and 409 mg/dL this AM, creatinine: 1.68 mg/dL, Ca: 6.4 mg/dL, GFR: 33 mL/min.  Drips: Levo @ 12 mcg/min, Precedex @ 1 mcg/kg/hr.  IVF: D5-150 mEq sodium bicarb @ 125 mL/hr (510 kcal)/   Diet Order:  Diet NPO time specified  Skin:  Wound (see comment) (Stage 1 sacral pressure injury)  Last BM:  8/9  Height:   Ht Readings from Last 1 Encounters:  12/17/15 5\' 3"  (1.6 m)    Weight:   Wt Readings from Last 1 Encounters:  12/18/15 132 lb 4.4 oz (60 kg)    Ideal Body Weight:  52.27 kg  BMI:  Body mass index is 23.43 kg/m.  Estimated Nutritional Needs:   Kcal:  [id number]  Protein:  72-90 grams (1.2-1.5 grams/kg)  Fluid:  1.6 L/day  EDUCATION NEEDS:   No education needs identified at this time    Jarome Matin, MS, RD, LDN Inpatient Clinical Dietitian Pager # [phone] After hours/weekend pager # [phone]

## 2015-12-18 NOTE — Progress Notes (Signed)
CRITICAL VALUE ALERT  Critical value received:  Lactic Acid 5.3  Date of notification:  12/18/15  Time of notification:  1010  Critical value read back:Yes.    Nurse who received alert:  Javier Glazier RN  MD notified (1st page):  Marni Griffon  Time of first page:  1016  MD notified (2nd page):  Time of second page:  Responding MD:  Marni Griffon  Time MD responded:  1016  No orders at this time.

## 2015-12-18 NOTE — Progress Notes (Signed)
CRITICAL VALUE ALERT  Critical value received: Lactic 5.9, Troponin 1.89, Potassium 2.7, Phosphorous <1   Date of notification: 12/18/2015  Time of notification:  [id number]  Critical value read back:Yes.    Nurse who received alert: Javier Glazier, RN  MD notified (1st page):  Dr. Oletta Darter (left message with [facility] nurse to be paged back)  Time of first page:  1655  Responding MD:  Oletta Darter  Time MD responded:  [id number]  MD made aware of all critical values as well as pt's complaint of abdominal pain and new onset core temp. At 1600 temp was 102 Farenheit. Orders added as MD deemed appropriate. Will continue to hold off on abdominal CT. Will continue to monitor patient

## 2015-12-18 NOTE — Progress Notes (Signed)
PULMONARY / CRITICAL CARE MEDICINE   Name: [name] MRN: [id number] DOB: [birth year]    ADMISSION DATE:  12/17/2015 CONSULTATION DATE:  12/17/2015  REFERRING MD:  Dr. Clementeen Graham, triad hospitalist  CHIEF COMPLAINT:  Cardiac arrest  SUBJECTIVE: Patient more awake this AM. Started on Vasopressors & Bicarb gtt overnight.  REVIEW OF SYSTEMS:  Unable to obtain given intubation.  VITAL SIGNS: BP (!) 79/64   Pulse (!) 101   Temp 98.7 F (37.1 C) (Oral)   Resp 19   Ht '5\' 3"'  (1.6 m)   Wt 132 lb 4.4 oz (60 kg)   SpO2 100%   BMI 23.43 kg/m   HEMODYNAMICS: CVP:  [9 mmHg-17 mmHg] 17 mmHg  VENTILATOR SETTINGS: Vent Mode: PRVC FiO2 (%):  [100 %] 100 % Set Rate:  [24 bmp-35 bmp] 35 bmp Vt Set:  [460 mL] 460 mL PEEP:  [5 cmH20] 5 cmH20 Plateau Pressure:  [15 cmH20-22 cmH20] 22 cmH20  INTAKE / OUTPUT: I/O last 3 completed shifts: In: 1234.9 [I.V.:724.9; NG/GT:200; IV Piggyback:310] Out: -   PHYSICAL EXAMINATION: General:  Chronically ill-appearing female, anxious and interactive.  Neuro:  Awake, follows commands. Interactive. Anxious  HEENT:  Normocephalic atraumatic, pupils equal round reactive to light, endotracheal tube is in place Cardiovascular:  Tachycardic, no murmurs gallops or rubs Lungs:   diminished right lower lobe, vent supported breaths, otherwise clear Abdomen:  Belly mildly distended, nontender nondistended Musculoskeletal:  Normal bulk and tone Skin:  Diaphoretic, cool to the touch, some mottling and extremities  LABS:  BMET  Recent Labs Lab 12/17/15 2036 12/17/15 2235 12/18/15 0443  NA 139 141 135  K 4.3 6.0* 4.1  CL 106 113* 106  CO2 8* <7* 11*  BUN '14 15 20  ' CREATININE 1.17* 1.04* 1.68*  GLUCOSE 92 146* 347*    Electrolytes  Recent Labs Lab 12/17/15 1600 12/17/15 2036 12/17/15 2235 12/18/15 0443  CALCIUM  --  6.7* 6.4* 6.4*  MG 2.2  --  2.0 1.8  PHOS  --   --  4.4 3.9    CBC  Recent Labs Lab 12/17/15 1544 12/18/15 0443  WBC  20.1* 5.6  HGB 13.7 10.1*  HCT 42.8 30.4*  PLT 492* 217    Coag's No results for input(s): APTT, INR in the last 168 hours.  Sepsis Markers  Recent Labs Lab 12/17/15 2036 12/17/15 2235 12/18/15 0227  LATICACIDVEN 6.3* 9.8* 2.0*    ABG  Recent Labs Lab 12/17/15 2304 12/18/15 0300  PHART 6.880* 7.089*  PCO2ART 39.0 20.7*  PO2ART 101* 154*    Liver Enzymes  Recent Labs Lab 12/17/15 1544  AST 77*  ALT 34  ALKPHOS 130*  BILITOT 0.4  ALBUMIN 4.8    Cardiac Enzymes  Recent Labs Lab 12/17/15 2235 12/18/15 0443  TROPONINI 0.03* 0.10*    Glucose  Recent Labs Lab 12/18/15 0409 12/18/15 0741  GLUCAP 311* 409*    Imaging Dg Chest 2 View  Result Date: 12/17/2015 CLINICAL DATA:  Shortness of breath, nausea and vomiting beginning this morning. EXAM: CHEST  2 VIEW COMPARISON:  PA and lateral chest 07/17/2015. FINDINGS: The lungs are clear. Heart size is normal. The patient is status post mitral valve repair. No pneumothorax or pleural effusion. Remote T12 compression fracture is seen. IMPRESSION: No acute disease. Electronically Signed   By: Inge Rise M.D.   On: 12/17/2015 15:23   Dg Abd 1 View  Result Date: 12/18/2015 CLINICAL DATA:  OG tube placement. EXAM: ABDOMEN - 1 VIEW  COMPARISON:  12/06/2015 FINDINGS: An OG tube is identified with tip overlying the distal stomach. The bowel gas pattern is unremarkable. Defibrillator pads overlying the lower chest noted. IMPRESSION: OG tube with tip overlying the distal stomach. Electronically Signed   By: Margarette Canada M.D.   On: 12/18/2015 01:15   Dg Chest Port 1 View  Result Date: 12/18/2015 CLINICAL DATA:  Central line placement.  Initial encounter. EXAM: PORTABLE CHEST 1 VIEW COMPARISON:  Chest radiograph performed earlier today at 10:32 p.m. FINDINGS: The patient's left IJ line is noted ending about the distal SVC. The patient's endotracheal tube is seen ending 2-3 cm above the carina. The right lower lobe  airspace opacification is compatible with pneumonia, similar in appearance to the prior study. No pleural effusion or pneumothorax is seen. The cardiomediastinal silhouette is mildly enlarged. The patient is status post median sternotomy. A valve replacement is noted. External pacing pads are seen. No acute osseous abnormalities are identified. IMPRESSION: 1. Left IJ line noted ending about the distal SVC. 2. Endotracheal tube seen ending 2-3 cm above the carina. 3. Right lower lobe pneumonia, similar in appearance to the recent prior study. 4. Mild cardiomegaly. Electronically Signed   By: Garald Balding M.D.   On: 12/18/2015 00:17   Dg Chest Port 1 View  Result Date: 12/17/2015 CLINICAL DATA:  CPR and intubation. EXAM: PORTABLE CHEST 1 VIEW COMPARISON:  12/17/2015. FINDINGS: Endotracheal tube is now identified with tip 2.3 cm above the carina. New dense consolidation/opacity throughout the medial right mid and lower lung noted. There is no evidence of pleural effusion or pneumothorax. Defibrillator pad overlying the lower chest noted. Cardiomegaly and cardiac valve replacement again identified. IMPRESSION: Endotracheal tube with tip 2.3 cm above the carina. New dense consolidations/opacity within the medial right mid and lower lung. Electronically Signed   By: Margarette Canada M.D.   On: 12/17/2015 23:00     STUDIES:    CULTURES: 12/17/2015 respiratory culture  ANTIBIOTICS: 12/17/2015 Unasyn  SIGNIFICANT EVENTS: 12/17/2015 admission to the hospitalist service later followed by cardiac arrest 8/10 Events since 8/9--> remained significantly acidotic, still on vasoactive gtts, hyperglycemic->ssi started; mild trop I rise; but does awaken and respond.   LINES/TUBES: 12/17/2015 endotracheal tube>>> 12/17/2015 left internal jugular central venous catheter>>>  DISCUSSION: 55 year old female with a past medical history significant for heavy alcohol abuse is admitted on 12/17/2015 with a severe combined  metabolic acidosis in the setting of ketoacidosis and lactic acidosis. The presumed diagnosis on admission was recurrent starvation acidosis. However she was also noted to have a significant lactic acidosis which was likely secondary to volume depletion in the setting of significant nausea and vomiting and no by mouth intake. Not long after admission she was admitted to the intensive care unit where she had a cardiac arrest which is due to an aspiration pneumonia. She likely aspirated secondary to esophageal dysmotility. She remains critically ill in the intensive care unit  ASSESSMENT / PLAN:  PULMONARY A: Aspiration pneumonia Acute respiratory failure with hypoxemia Obstructive sleep apnea P:   Full ventilatory support Ventilator associated pneumonia prevention protocol Chest x-ray daily  F/u abg given high Ve and bicarb supplementation-->high risk for eventual respiratory alk    CARDIOVASCULAR A:  Sinus tachycardia in the setting of acute respiratory therapy hypoxemia History of "cardiomyopathy but echocardiogram in June 2017 showed a normal LVEF, mild dilation of the right side with an RVSP in the 40 range Lactic acidosis, presumably due to volume depletion and now hypoxemia postcardiac  arrest ->weaning pressors ->CVP at goal P:  Telemetry monitoring CVP monitoring Continue IV fluid resuscitation with sodium bicarbonate Wean for MAP > 65  RENAL A:   Severe metabolic acidosis (both AGMA and NAMA), anion gap positive secondary to ketoacidosis and lactic acidosis Acute kidney injury Hyperkalemia  P:   Serial blood chemistries & abgs Sodium bicarbonate drip (cont at current) Strict I&O Repeat lactic acid   GASTROINTESTINAL A:   Mildly elevated AST, likely consistent with alcoholic hepatitis P:   Started tube feedings, watch closely for refeeding syndrome, follow magnesium and phosphorus closely She had nml lipase-->CT abd was ordered, will hold this for  now  HEMATOLOGIC A:   Anemia of chronic disease ->hgb drift reflects initial hemoconcentration. She has no evidence of bleeding.  P:  Monitor for bleeding [place] heparin  Cbc in am   INFECTIOUS A:   Aspiration pneumonia P:   Respiratory culture Started Unasyn 8/9  ENDOCRINE A:   Hyperglycemia  P:   Accu-Checks q4hr Checking A1c and Beta-hydroxybutyric acid  SSI started   NEUROLOGIC A:   Acute encephalopathy in the setting of hypoxemia alcohol withdrawal Heavy alcohol abuse P:   RASS goal: -1 PAD protocol with when necessary fentanyl and Precedex Thiamine, Folate & MV    FAMILY  - Updates: Son updated by Dr. Ashok Cordia at bedside 8/10.  - Inter-disciplinary family meet or Palliative Care meeting due by:  8/16  Erick Colace ACNP-BC [place] Pager # [phone] OR # [phone] if no answer  PCCM Attending Note: Patient seen and examined with nurse practitioner. Please refer to his progress note which I have reviewed in detail.  Status post cardiac arrest. Neurologically the patient seems to be intact and grossly nonfocal moving extremities and nodding to questions. 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 Continuing to trend electrolytes in the setting of worsening lactic acidosis likely secondary to her acute renal failure. May require decreasing bicarbonate infusion rate or simply discontinuing depending upon subsequent blood gas results and electrolyte panels. Continuing to wean FiO2. Continuing to wean vasopressor support. Patient remains critically ill and this was communicated to her son at bedside.  I spent a total of 36 minutes of critical care time today caring for the patient, updating her son at bedside, and reviewing the patient's electronic medical record.  Sonia Baller Ashok Cordia, M.D. [facility] Pulmonary & Critical Care Pager:   [phone] After 3pm or if no response, call [phone] 3:06 PM 12/18/15

## 2015-12-18 NOTE — Progress Notes (Signed)
Pharmacy Antibiotic Note  [name] is a 55 y.o. female admitted on 12/17/2015 with aspiration pna. Pharmacy has been consulted for unasyn dosing.  Plan: Unasyn 3 Gm IV q6h     Temp (24hrs), Avg:98.6 F (37 C), Min:98.5 F (36.9 C), Max:98.6 F (37 C)   Recent Labs Lab 12/17/15 1544 12/17/15 1704 12/17/15 2036 12/17/15 2235  WBC 20.1*  --   --   --   CREATININE 1.23*  --  1.17* 1.04*  LATICACIDVEN  --  12.19* 6.3* 9.8*    Estimated Creatinine Clearance: 50.6 mL/min (by C-G formula based on SCr of 1.04 mg/dL).    Allergies  Allergen Reactions  . Depakote [Divalproex Sodium] Anaphylaxis  . Diazepam Other (See Comments)    Died on operating table  . Valium Anaphylaxis  . Tizanidine Hcl Other (See Comments)    "passed out"    Antimicrobials this admission: 8/10 unasyn >>    >>   Dose adjustments this admission:   Microbiology results:  BCx:   UCx:    Sputum:    MRSA PCR:   Thank you for allowing pharmacy to be a part of this patient's care.  Dorrene German 12/18/2015 12:03 AM

## 2015-12-18 NOTE — Progress Notes (Signed)
[place] Progress Note Patient Name: [name] DOB: November 18, 1960 MRN: [id number]   Date of Service  12/18/2015  HPI/Events of Note  sbp 60s, K=6.4  eICU Interventions  Start levophed,  D50 1 amp and 10u novolog     Intervention Category Major Interventions: Sepsis - evaluation and management;Hypotension - evaluation and management  Andre Swander 12/18/2015, 6:22 AM

## 2015-12-18 NOTE — Progress Notes (Signed)
[place] Progress Note Patient Name: [name] DOB: Apr 30, 1961 MRN: [id number]   Date of Service  12/18/2015  HPI/Events of Note  Multiple issues: 1. Lactic Acid = 5.3 >> 5.9, 2. Fever to 102.0 F (already on Unasyn, blood cultures from 12/17/2015 X 2 negative to date and already on Tylenol), 3. K+ = 2.7 (already being replaced) and 4. PO4--- < 0.1.  eICU Interventions  Will order: 1. Bolus with 0.9 NaCl 1 liter IV over 1 hour now.  2. Replete PO4---.      Intervention Category Intermediate Interventions: Electrolyte abnormality - evaluation and management;Infection - evaluation and management  Sommer,Steven Eugene 12/18/2015, 5:10 PM

## 2015-12-18 NOTE — Progress Notes (Signed)
Sips of sprite and a chewable aspirin were given at 2159 patient tolerated well and attempted to take a folic acid but immediately spit it out and took another sip of sprite. No coughing was noted. I left the room to crush the remaining pills to attempt administration in applesauce. I returned at approximately 2214 to find the patient extremely anxious with complaints of not being able to breath, similar to the episode she had before transfer to the unit. She suddenly became unresponsive, bagging and CPR were started.

## 2015-12-18 NOTE — Progress Notes (Signed)
Spoke with Chrys Racer, RN regarding switching to [employer].   Confirmed RN will adjust settings in infusion pump for concentrated product.  Reuel Boom, PharmD, BCPS Pager: [phone] 12/18/2015, 5:54 PM

## 2015-12-18 NOTE — Progress Notes (Signed)
RT attempted to place arterial line x 2 with no success, NP aware.

## 2015-12-18 NOTE — Progress Notes (Signed)
CRITICAL VALUE ALERT  Critical value received: Ca+ 6.4  Date of notification: 12/18/15  Time of notification: 0530  Critical value read back:Yes.    Nurse who received alert:  Darrin NipperRN  MD notified (1st page): Elink  Time of first page: [phone]

## 2015-12-19 ENCOUNTER — Inpatient Hospital Stay (HOSPITAL_COMMUNITY): Payer: Medicare Other

## 2015-12-19 DIAGNOSIS — I214 Non-ST elevation (NSTEMI) myocardial infarction: Secondary | ICD-10-CM | POA: Diagnosis present

## 2015-12-19 DIAGNOSIS — E876 Hypokalemia: Secondary | ICD-10-CM

## 2015-12-19 DIAGNOSIS — N179 Acute kidney failure, unspecified: Secondary | ICD-10-CM

## 2015-12-19 DIAGNOSIS — R571 Hypovolemic shock: Secondary | ICD-10-CM

## 2015-12-19 DIAGNOSIS — I483 Typical atrial flutter: Secondary | ICD-10-CM

## 2015-12-19 LAB — BASIC METABOLIC PANEL
ANION GAP: 8 (ref 5–15)
Anion gap: 11 (ref 5–15)
Anion gap: 8 (ref 5–15)
Anion gap: 9 (ref 5–15)
BUN: 19 mg/dL (ref 6–20)
BUN: 19 mg/dL (ref 6–20)
BUN: 18 mg/dL (ref 6–20)
BUN: 16 mg/dL (ref 6–20)
CALCIUM: 7.1 mg/dL — AB (ref 8.9–10.3)
CHLORIDE: 102 mmol/L (ref 101–111)
CO2: 27 mmol/L (ref 22–32)
CO2: 27 mmol/L (ref 22–32)
CO2: 28 mmol/L (ref 22–32)
CO2: 28 mmol/L (ref 22–32)
CREATININE: 1.18 mg/dL — AB (ref 0.44–1.00)
CREATININE: 0.94 mg/dL (ref 0.44–1.00)
Calcium: 7.1 mg/dL — ABNORMAL LOW (ref 8.9–10.3)
Calcium: 7.2 mg/dL — ABNORMAL LOW (ref 8.9–10.3)
Calcium: 7.2 mg/dL — ABNORMAL LOW (ref 8.9–10.3)
Chloride: 105 mmol/L (ref 101–111)
Chloride: 105 mmol/L (ref 101–111)
Chloride: 108 mmol/L (ref 101–111)
Creatinine, Ser: 1.03 mg/dL — ABNORMAL HIGH (ref 0.44–1.00)
Creatinine, Ser: 1.05 mg/dL — ABNORMAL HIGH (ref 0.44–1.00)
GFR calc Af Amer: 59 mL/min — ABNORMAL LOW (ref >60)
GFR calc Af Amer: >60 mL/min (ref >60)
GFR calc Af Amer: >60 mL/min (ref >60)
GFR calc non Af Amer: >60 mL/min (ref >60)
GFR calc non Af Amer: 59 mL/min — ABNORMAL LOW (ref >60)
GFR, EST NON AFRICAN AMERICAN: 51 mL/min — AB (ref >60)
GLUCOSE: 165 mg/dL — AB (ref 65–99)
GLUCOSE: 110 mg/dL — AB (ref 65–99)
Glucose, Bld: 140 mg/dL — ABNORMAL HIGH (ref 65–99)
Glucose, Bld: 99 mg/dL (ref 65–99)
POTASSIUM: 2.8 mmol/L — AB (ref 3.5–5.1)
POTASSIUM: 2.7 mmol/L — AB (ref 3.5–5.1)
Potassium: 3.3 mmol/L — ABNORMAL LOW (ref 3.5–5.1)
Potassium: 3.1 mmol/L — ABNORMAL LOW (ref 3.5–5.1)
SODIUM: 140 mmol/L (ref 135–145)
SODIUM: 141 mmol/L (ref 135–145)
Sodium: 141 mmol/L (ref 135–145)
Sodium: 144 mmol/L (ref 135–145)

## 2015-12-19 LAB — ECHOCARDIOGRAM COMPLETE
CHL CUP LVOT MV VTI INDEX: 0.52 cm2/m2
CHL CUP MV M VEL: 121
CHL CUP TV REG PEAK VELOCITY: 324 cm/s
E decel time: 276 msec
FS: 24 % — AB (ref 28–44)
Height: 63 in
IVS/LV PW RATIO, ED: 0.94
LA ID, A-P, ES: 46 mm
LADIAMINDEX: 2.84 cm/m2
LAVOLA4C: 56.5 mL
LEFT ATRIUM END SYS DIAM: 46 mm
LV PW d: 8.72 mm — AB (ref 0.6–1.1)
LV SIMPSON'S DISK: 34
LV dias vol index: 31 mL/m2
LV dias vol: 50 mL (ref 46–106)
LVOT MV VTI: 0.84
LVOT SV: 42 mL
LVOT VTI: 16.5 cm
LVOT area: 2.54 cm2
LVOT diameter: 18 mm
LVOT peak vel: 86.2 cm/s
LVSYSVOL: 33 mL (ref 14–42)
LVSYSVOLIN: 20 mL/m2
MV Annulus VTI: 50 cm
MV Dec: 276
MV Peak grad: 15 mmHg
MVG: 7 mmHg
MVPKAVEL: 140 m/s
MVPKEVEL: 195 m/s
RV LATERAL S' VELOCITY: 5 cm/s
RV TAPSE: 15.1 mm
Stroke v: 17 ml
TRMAXVEL: 324 cm/s
WEIGHTICAEL: 2116.42 [oz_av]

## 2015-12-19 LAB — PROTIME-INR
INR: 1.25
Prothrombin Time: 15.7 seconds — ABNORMAL HIGH (ref 11.4–15.2)

## 2015-12-19 LAB — LACTIC ACID, PLASMA
LACTIC ACID, VENOUS: 1.9 mmol/L (ref 0.5–1.9)
Lactic Acid, Venous: 3 mmol/L (ref 0.5–1.9)

## 2015-12-19 LAB — BLOOD GAS, ARTERIAL
ACID-BASE EXCESS: 5.6 mmol/L — AB (ref 0.0–2.0)
Bicarbonate: 28.2 mEq/L — ABNORMAL HIGH (ref 20.0–24.0)
DRAWN BY: 232811
FIO2: 0.3
LHR: 20 {breaths}/min
MECHVT: 420 mL
O2 SAT: 98.6 %
PATIENT TEMPERATURE: 37.1
PEEP/CPAP: 5 cmH2O
PH ART: 7.53 — AB (ref 7.350–7.450)
PO2 ART: 95.3 mmHg (ref 80.0–100.0)
TCO2: 25.6 mmol/L (ref 0–100)
pCO2 arterial: 34 mmHg — ABNORMAL LOW (ref 35.0–45.0)

## 2015-12-19 LAB — GLUCOSE, CAPILLARY
GLUCOSE-CAPILLARY: 142 mg/dL — AB (ref 65–99)
GLUCOSE-CAPILLARY: 147 mg/dL — AB (ref 65–99)
Glucose-Capillary: 128 mg/dL — ABNORMAL HIGH (ref 65–99)
Glucose-Capillary: 131 mg/dL — ABNORMAL HIGH (ref 65–99)
Glucose-Capillary: 108 mg/dL — ABNORMAL HIGH (ref 65–99)
Glucose-Capillary: 115 mg/dL — ABNORMAL HIGH (ref 65–99)
Glucose-Capillary: 118 mg/dL — ABNORMAL HIGH (ref 65–99)
Glucose-Capillary: 120 mg/dL — ABNORMAL HIGH (ref 65–99)
Glucose-Capillary: 138 mg/dL — ABNORMAL HIGH (ref 65–99)
Glucose-Capillary: 109 mg/dL — ABNORMAL HIGH (ref 65–99)
Glucose-Capillary: 73 mg/dL (ref 65–99)

## 2015-12-19 LAB — C DIFFICILE QUICK SCREEN W PCR REFLEX
C DIFFICILE (CDIFF) INTERP: NOT DETECTED
C DIFFICILE (CDIFF) TOXIN: NEGATIVE
C DIFFICLE (CDIFF) ANTIGEN: NEGATIVE

## 2015-12-19 LAB — TROPONIN I
TROPONIN I: 18.59 ng/mL — AB (ref <0.03)
Troponin I: 13.88 ng/mL (ref <0.03)
Troponin I: 21.83 ng/mL (ref <0.03)
Troponin I: 9.89 ng/mL (ref <0.03)
Troponin I: 7.63 ng/mL (ref <0.03)

## 2015-12-19 LAB — PHOSPHORUS
PHOSPHORUS: 1.1 mg/dL — AB (ref 2.5–4.6)
PHOSPHORUS: 2.6 mg/dL (ref 2.5–4.6)

## 2015-12-19 LAB — MAGNESIUM: Magnesium: 2.3 mg/dL (ref 1.7–2.4)

## 2015-12-19 LAB — HEMOGLOBIN A1C
HEMOGLOBIN A1C: 4.6 % — AB (ref 4.8–5.6)
MEAN PLASMA GLUCOSE: 85 mg/dL

## 2015-12-19 LAB — HEPARIN LEVEL (UNFRACTIONATED): Heparin Unfractionated: 0.16 IU/mL — ABNORMAL LOW (ref 0.30–0.70)

## 2015-12-19 LAB — APTT: APTT: 27 s (ref 24–36)

## 2015-12-19 MED ORDER — CETYLPYRIDINIUM CHLORIDE 0.05 % MT LIQD
7.0000 mL | Freq: Two times a day (BID) | OROMUCOSAL | Status: DC
  Administered 2015-12-19 – 2015-12-26 (×11): 7 mL via OROMUCOSAL

## 2015-12-19 MED ORDER — MORPHINE SULFATE (PF) 2 MG/ML IV SOLN
1.0000 mg | INTRAVENOUS | Status: DC | PRN
  Administered 2015-12-19 (×3): 1 mg via INTRAVENOUS
  Filled 2015-12-19 (×3): qty 1

## 2015-12-19 MED ORDER — NITROGLYCERIN 0.4 MG SL SUBL
0.4000 mg | SUBLINGUAL_TABLET | SUBLINGUAL | Status: DC | PRN
  Administered 2015-12-19: 0.4 mg via SUBLINGUAL
  Filled 2015-12-19: qty 1

## 2015-12-19 MED ORDER — HEPARIN (PORCINE) IN NACL 100-0.45 UNIT/ML-% IJ SOLN
900.0000 [IU]/h | INTRAMUSCULAR | Status: DC
  Administered 2015-12-19: 900 [IU]/h via INTRAVENOUS
  Filled 2015-12-19: qty 250

## 2015-12-19 MED ORDER — DEXTROSE 5 % IV SOLN
INTRAVENOUS | Status: DC
  Administered 2015-12-19: 17:00:00 via INTRAVENOUS

## 2015-12-19 MED ORDER — NITROGLYCERIN 0.4 MG SL SUBL: 0.4000 mg | SUBLINGUAL_TABLET | SUBLINGUAL | Status: DC | PRN

## 2015-12-19 MED ORDER — POTASSIUM CHLORIDE 20 MEQ/15ML (10%) PO SOLN
40.0000 meq | ORAL | Status: AC
  Administered 2015-12-19 (×2): 40 meq
  Filled 2015-12-19 (×2): qty 30

## 2015-12-19 MED ORDER — HEPARIN BOLUS VIA INFUSION
3000.0000 [IU] | Freq: Once | INTRAVENOUS | Status: AC
  Administered 2015-12-19: 3000 [IU] via INTRAVENOUS
  Filled 2015-12-19: qty 3000

## 2015-12-19 MED ORDER — ASPIRIN 81 MG PO CHEW: 81.0000 mg | CHEWABLE_TABLET | Freq: Every day | ORAL | Status: DC

## 2015-12-19 MED ORDER — LIP MEDEX EX OINT
TOPICAL_OINTMENT | CUTANEOUS | Status: AC
  Administered 2015-12-19
  Filled 2015-12-19: qty 7

## 2015-12-19 MED ORDER — DEXTROSE 10 % IV SOLN
INTRAVENOUS | Status: DC | PRN
  Administered 2015-12-20 – 2015-12-21 (×5): via INTRAVENOUS

## 2015-12-19 MED ORDER — DEXTROSE 50 % IV SOLN
25.0000 mL | Freq: Once | INTRAVENOUS | Status: AC
Start: 2015-12-19 — End: 2015-12-19
  Administered 2015-12-19: 25 mL via INTRAVENOUS
  Filled 2015-12-19: qty 50

## 2015-12-19 MED ORDER — ATORVASTATIN CALCIUM 40 MG PO TABS
40.0000 mg | ORAL_TABLET | Freq: Every day | ORAL | Status: DC
  Administered 2015-12-21 – 2015-12-25 (×5): 40 mg via ORAL
  Filled 2015-12-19 (×5): qty 1

## 2015-12-19 MED ORDER — SALINE SPRAY 0.65 % NA SOLN
1.0000 | NASAL | Status: DC | PRN
  Filled 2015-12-19: qty 44

## 2015-12-19 MED ORDER — POTASSIUM CHLORIDE 10 MEQ/50ML IV SOLN
10.0000 meq | INTRAVENOUS | Status: AC
  Administered 2015-12-19 (×4): 10 meq via INTRAVENOUS
  Filled 2015-12-19 (×4): qty 50

## 2015-12-19 MED ORDER — LIP MEDEX EX OINT: TOPICAL_OINTMENT | CUTANEOUS | Status: DC | PRN

## 2015-12-19 MED ORDER — SODIUM PHOSPHATES 45 MMOLE/15ML IV SOLN
30.0000 mmol | Freq: Once | INTRAVENOUS | Status: AC
  Administered 2015-12-19: 30 mmol via INTRAVENOUS
  Filled 2015-12-19: qty 10

## 2015-12-19 MED ORDER — HEPARIN BOLUS VIA INFUSION
1500.0000 [IU] | INTRAVENOUS | Status: AC
  Administered 2015-12-19: 1500 [IU] via INTRAVENOUS
  Filled 2015-12-19: qty 1500

## 2015-12-19 MED ORDER — HEPARIN (PORCINE) IN NACL 100-0.45 UNIT/ML-% IJ SOLN
700.0000 [IU]/h | INTRAMUSCULAR | Status: DC
  Administered 2015-12-19: 700 [IU]/h via INTRAVENOUS
  Filled 2015-12-19: qty 250

## 2015-12-19 MED ORDER — INSULIN GLARGINE 100 UNIT/ML ~~LOC~~ SOLN
10.0000 [IU] | SUBCUTANEOUS | Status: DC
  Administered 2015-12-19: 10 [IU] via SUBCUTANEOUS
  Filled 2015-12-19 (×3): qty 0.1

## 2015-12-19 MED ORDER — MORPHINE SULFATE (PF) 2 MG/ML IV SOLN
1.0000 mg | INTRAVENOUS | Status: DC | PRN
  Administered 2015-12-19 – 2015-12-22 (×15): 2 mg via INTRAVENOUS
  Filled 2015-12-19 (×15): qty 1

## 2015-12-19 MED ORDER — INSULIN ASPART 100 UNIT/ML ~~LOC~~ SOLN
1.0000 [IU] | SUBCUTANEOUS | Status: DC
  Administered 2015-12-19 – 2015-12-21 (×4): 1 [IU] via SUBCUTANEOUS

## 2015-12-19 NOTE — Procedures (Signed)
Extubation Procedure Note  Patient Details:   Name: [name] DOB: [birth year] MRN: [id number]   Airway Documentation:     Evaluation  O2 sats: stable throughout Complications: No apparent complications Patient did tolerate procedure well. Bilateral Breath Sounds: Clear, Diminished   Yes  IS = 250cc  Irish Elders Royal 12/19/2015, 10:20 AM

## 2015-12-19 NOTE — Progress Notes (Signed)
[place] Progress Note Patient Name: [name] DOB: [birth year] MRN: [id number]   Date of Service  12/19/2015  HPI/Events of Note  Hypoglycemia - Blood glucose = 73. Patient on Lantus, Novolog SSI, NPO and not on enteric nutrition.  eICU Interventions  Will order: 1. D5W to run IV at 50 mL/hour.     Intervention Category Major Interventions: Other:  Lysle Dingwall 12/19/2015, 5:22 PM

## 2015-12-19 NOTE — Progress Notes (Signed)
PULMONARY / CRITICAL CARE MEDICINE   Name: [name] MRN: [id number] DOB: 01-12-1961    ADMISSION DATE:  12/17/2015 CONSULTATION DATE:  12/17/2015  REFERRING MD:  Dr. Clementeen Graham, triad hospitalist  CHIEF COMPLAINT:  Cardiac arrest  SUBJECTIVE: Patient more awake this AM. Started on Vasopressors & Bicarb gtt overnight.  REVIEW OF SYSTEMS:  Unable to obtain given intubation.  VITAL SIGNS: BP 117/78   Pulse 85   Temp (!) 102 F (38.9 C)   Resp 20   Ht 5\' 3"  (1.6 m)   Wt 132 lb 4.4 oz (60 kg)   SpO2 100%   BMI 23.43 kg/m   HEMODYNAMICS: CVP:  [14 mmHg-18 mmHg] 15 mmHg  VENTILATOR SETTINGS: Vent Mode: PRVC FiO2 (%):  [30 %-60 %] 30 % Set Rate:  [18 bmp-35 bmp] 18 bmp Vt Set:  [406 mL-460 mL] 420 mL PEEP:  [5 cmH20] 5 cmH20 Plateau Pressure:  [14 cmH20-20 cmH20] 18 cmH20  INTAKE / OUTPUT: I/O last 3 completed shifts: In: 91 [I.V.:4066; Other:200; NG/GT:769; IV Piggyback:2550] Out: 1360 [Urine:1360]  PHYSICAL EXAMINATION: General:  Chronically ill-appearing female, anxious and interactive. No distress  Neuro:  Awake, follows commands. Interactive. Anxious oriented writing notes  HEENT:  Normocephalic atraumatic, pupils equal round reactive to light, endotracheal tube is in place Cardiovascular:  Tachycardic, no murmurs gallops or rubs Lungs:   diminished right lower lobe, vent supported breaths, excellent Vt on SBT Abdomen:  Belly mildly distended, nontender nondistended Musculoskeletal:  Normal bulk and tone Skin:  Diaphoretic, cool to the touch, some mottling and extremities  LABS:  BMET  Recent Labs Lab 12/18/15 2000 12/18/15 2350 12/19/15 0450  NA 139 140 141  K 2.9* 2.8* 2.7*  CL 105 102 105  CO2 25 27 28   BUN 19 19 19   CREATININE 1.37* 1.18* 1.03*  GLUCOSE 147* 165* 110*    Electrolytes  Recent Labs Lab 12/18/15 0443  12/18/15 1545 12/18/15 2000 12/18/15 2350 12/19/15 0450  CALCIUM 6.4*  < > 6.7* 6.3* 7.2* 7.1*  MG 1.8  --  1.4*  --    --  2.3  PHOS 3.9  --  <0.1*  --   --  1.1*  < > = values in this interval not displayed.  CBC  Recent Labs Lab 12/17/15 1544 12/18/15 0443  WBC 20.1* 5.6  HGB 13.7 10.1*  HCT 42.8 30.4*  PLT 492* 217    Coag's No results for input(s): APTT, INR in the last 168 hours.  Sepsis Markers  Recent Labs Lab 12/18/15 0910 12/18/15 1545 12/18/15 2345  LATICACIDVEN 5.3* 5.9* 3.0*    ABG  Recent Labs Lab 12/18/15 0300 12/18/15 1315 12/19/15 0145  PHART 7.089* 7.471* 7.530*  PCO2ART 20.7* 24.7* 34.0*  PO2ART 154* 219* 95.3    Liver Enzymes  Recent Labs Lab 12/17/15 1544  AST 77*  ALT 34  ALKPHOS 130*  BILITOT 0.4  ALBUMIN 4.8    Cardiac Enzymes  Recent Labs Lab 12/18/15 1039 12/18/15 2145 12/19/15 0450  TROPONINI 7.89* 13.88* 21.83*    Glucose  Recent Labs Lab 12/19/15 0043 12/19/15 0141 12/19/15 0319 12/19/15 0421 12/19/15 0518 12/19/15 0621  GLUCAP 128* 131* 118* 115* 108* 120*    Imaging Dg Chest Port 1 View  Result Date: 12/19/2015 CLINICAL DATA:  Pneumonia. EXAM: PORTABLE CHEST 1 VIEW COMPARISON:  12/18/2015. FINDINGS: Endotracheal tube, NG tube, left IJ line stable position. Prior cardiac valve repair. Stable cardiomegaly. Progressive bibasilar atelectasis and infiltrates. Small right pleural effusion. No pneumothorax .  IMPRESSION: 1. Lines and tubes in stable position. 2. Progressive bibasilar atelectasis and infiltrates. Small right pleural effusion. 3. No pneumothorax. Electronically Signed   By: Marcello Moores  Register   On: 12/19/2015 07:04   Dg Chest Port 1 View  Result Date: 12/18/2015 CLINICAL DATA:  Pneumonia, followup, intubation, central line, history cardiomyopathy, pulmonary embolism, hypertension, GERD EXAM: PORTABLE CHEST 1 VIEW COMPARISON:  Portable exam 0943 hours compared 12/17/2015 FINDINGS: Tip of endotracheal tube projects 5.9 cm above carina. Nasogastric tube extends into stomach. LEFT jugular central venous catheter tip  projects over SVC near cavoatrial junction. External pacing leads project over chest. Enlargement of cardiac silhouette post MVR. Mediastinal contours normal vascularity normal. Persistent RIGHT middle lobe consolidation consistent with pneumonia. Question developing LEFT lower lobe consolidation as well. Remaining lungs clear. No pleural effusion or pneumothorax. IMPRESSION: Persistent RIGHT middle lobe pneumonia with question developing infiltrate versus atelectasis in LEFT lower lobe. Line and tube positions as above. Electronically Signed   By: Lavonia Dana M.D.   On: 12/18/2015 11:13    STUDIES:  PCXR 8/11: persistent RLL airspace disease. Actually a little worse when c/w exam the am 8/10Tubes and lines good position  MICROBIOLOGY: MRSA PCR 8/9:  Positive Blood Ctx x2 8/9 >> Tracheal Asp Ctx 8/9 >> Few GPC clusters  ANTIBIOTICS: Unasyn 8/9 >>  SIGNIFICANT EVENTS: 8/09 - admission to the hospitalist service later followed by cardiac arrest 8/10 - Events since 8/9--> remained significantly acidotic, still on vasoactive gtts, hyperglycemic->ssi started; mild trop I rise; but does awaken and respond. Started on insulin gtt for DKA 8/11 - iatrogenic metabolic and resp alkalosis bicarb stopped, Ve adjusted. Trop I 21.83, replacing electrolytes.  Resolved Problems: Severe metabolic acidosis (both AGMA and NAMA), anion gap positive secondary to ketoacidosis (DKA) and lactic acidosis (resolved 8/11); Acute kidney injury (resolved 8/11); Hyperkalemia (resolved); Passing SBT-->extubated   LINES/TUBES: OETT 7.5 8/9 >> L IJ CVL 8/9 >> Foley 8/9 >> OGT 8/9 >> PIV x1  DISCUSSION: 55 year old female with a past medical history significant for heavy alcohol abuse is admitted on 12/17/2015 with a severe combined metabolic acidosis in the setting of ketoacidosis and lactic acidosis. The presumed diagnosis on admission was recurrent starvation acidosis. However she was also noted to have a significant  lactic acidosis which was likely secondary to volume depletion in the setting of significant nausea and vomiting and no by mouth intake. Not long after admission she was admitted to the intensive care unit where she had a cardiac arrest which is due to an aspiration pneumonia. She likely aspirated secondary to esophageal dysmotility & spasm. She is now fully awake, weaning off pressors, follows commands, and tolerating SBT. Her most concerning issue for today is her CE elevation. We have asked cards to see. We will start heparin gtt and aspirin. ECHO is pending. She will likely need cardiac cath. She reports "esophageal spasm" this may be true and should be evaluated but think we need to address the cardiac issues as well.    ASSESSMENT / PLAN:  PULMONARY A: Acute Hypoxic Respiratory Failure - Secondary to aspiration pneumonia. Aspiration Pneumonia Respiratory Alkalosis - Resolved. OSA  P:   SBT If extubated she will regulate her own gas exchange  Chest x-ray daily  PAD protocol -->dc sedation if extubated    CARDIOVASCULAR A:  S/P Cardiac Arrest 8/9 NSTEMI History of "cardiomyopathy but echocardiogram in June 2017 showed a normal LVEF, mild dilation of the right side with an RVSP in the 40 range Shock -  Possibly secondary to Precedex vs Cardiogenic.  P:  Telemetry monitoring CVP goal 8-12 Cards eval Continuing ASA 81mg  Lipitor qhs Starting Heparin gtt per pharmacy protocol Wean Levophed for MAP > 65 Awaiting TTE  RENAL A:   Metabolic/Lactic Acidosis - Resolved. Metabolic Alkalosis - Secondary to bicarb gtt. Resolved.  Hypokalemia - Replaced. Hypophosphatemia - Replaced. Acute Renal Failure - Improving.  P:   Trending UOP w/ Foley Monitoring electrolytes & renal function daily Repeat electrolytes at 1600 today Replacing electrolytes as indicated S/P KCl VT this AM S/P Sodium Phos IV  GASTROINTESTINAL A:   H/O EtOH Possible esophageal spasm H/O Esophageal  Dysmotility  P:   Holding on tube feeds pending possible extubation Pepcid IV q12hr Repeat LFTs in AM Possible GI Evaluation pending Cardiology evaluation Monitoring closely for re-feeding syndrome  HEMATOLOGIC A:   Anemia - No signs of active bleeding. Likely due to chronic disease/EtOH.  P:  Repeat CBC in AM Systemic anticoagulation w/ Heparin gtt per pharmacy protocol  INFECTIOUS A:   Aspiration Pneumonia  P:   Empiric Unasyn Day #3 Awaiting cultures  ENDOCRINE A:   DKA - Resolved.  P:   Accu-Checks q4hr SSI started  Lantus started  NEUROLOGIC A:   Acute Encephalopathy - Multifactorial in setting of acidosis & hypoxia. Resolving.  EtOH Withdrawal Heavy EtOH Abuse  P:   RASS goal: -1 PAD protocol with when necessary fentanyl and Precedex-->if extubated we will dc this  Thiamine, Folate & MV   FAMILY  - Updates: Mother updated at bedside 8/11 by PB.  - Inter-disciplinary family meet or Palliative Care meeting due by:  8/16  Erick Colace ACNP-BC [place] Pager # [phone] OR # [phone] if no answer  PCCM Attending Note: Patient seen and examined with nurse practitioner. Please refer to his progress note which I have reviewed in detail. Hypokalemia and hypophosphatemia replaced. Patient passed spontaneous breathing trial this morning and plan for extubation if cuff leak is present. Cardiology evaluation pending. Patient continuing on heparin infusion, Lipitor, and aspirin. Hopefully once we are able to discontinue Precedex and fentanyl we will be able to wean vasopressor support. Awaiting echocardiogram. Continue close ICU monitoring.  I spent a total of 33 minutes of critical care time this morning caring for the patient and reviewing the patient's electronic medical record.  Sonia Baller Ashok Cordia, M.D. [facility] Pulmonary & Critical Care Pager:  [phone] After 3pm or if no response, call [phone] 10:05 AM 12/19/15

## 2015-12-19 NOTE — Progress Notes (Signed)
CRITICAL VALUE ALERT  Critical value received: Ca+ 6.3  K+ 2.9  Date of notification: 12/18/15  Time of notification: 2030  Critical value read back:Yes.    Nurse who received alert:  Darrin Nipper, RN  MD notified (1st page): Elink  Time of first page: [phone]

## 2015-12-19 NOTE — Progress Notes (Signed)
CRITICAL VALUE ALERT  Critical value received:  K+ 2.7 , trop 21.83  Date of notification: 12/19/2015  Time of notification: 0530  Critical value read back:Yes.    Nurse who received alert:  Darrin Nipper, RN  MD notified (1st page): Elink  Time of first page: [phone]

## 2015-12-19 NOTE — Progress Notes (Signed)
Nutrition Follow-up  DOCUMENTATION CODES:   Severe malnutrition in context of acute illness/injury  INTERVENTION:  - Diet advancement as medically feasible. - Continue to monitor for refeeding with diet advancement.  - RD will follow-up 8/14.  NUTRITION DIAGNOSIS:   Inadequate oral intake related to inability to eat as evidenced by NPO status. -ongoing  GOAL:   Patient will meet greater than or equal to 90% of their needs -unmet at this time.  MONITOR:   Diet advancement, Weight trends, Labs, Skin, I & O's ASSESSMENT:   55 y.o. female with alcohol abuse, major depression, hypertension, hearing impairment who was recently hospitalized with increased anion gap metabolic acidosis likely in the setting of alcohol abuse with starvation ketoacidosis and poor by mouth intake. Patient lives with her daughter and drinks almost regularly. For past few days she has been drinking vodka and not eating or drinking any other fluids. Since started having nausea vomiting with epigastric pain this morning. Denied hematemesis, tremors or seizures. Denied any headaches, fevers, chills, chest pain, palpitations or shortness of breath. Patient informed that she had not eaten anything for past 3 days and also not taken her medications.   8/11 Pt extubated about 30 minutes ago and OGT removed at that time. Estimated nutrition needs updated related to procedure. Pt remains NPO; will follow-up 8/14 to obtain further information from pt and to provide nutrition-related interventions as needed. Notes indicate pt had NSTEMI on 8/9.  Medications reviewed; 2 g IV Ca gluconate x1 dose yesterday, 1 mg folic acid/day, 1 unit Novolog every 4 hours, 10 units Lantus/day, 4 g IV Mg sulfate x1 dose yesterday, 15 mL multivitamin/day, 40 mEq KCL every 4 hours, 30 mmol KPHos x1 dose today, 100 mg thiamine/day. Labs reviewed; CBGs: 108-138 mg/dL today, K: 3.3 mmol/L, Ca: 7.1 mg/dL.    8/10 - No family/visitors present at  this time.  - Pt with hx of alcohol abuse and noted that she was drinking, not eating any food, for 3 days PTA.  - Question if pt is at risk for refeeding given substance abuse hx and possible other times of poor food intake PTA.  - Noted she was recently hospitalized but no meal completions documented from that time.  - Pt was intubated ~2245 on 8/9 following period of anxiety and unresponsiveness.  - Pt receiving TF per protocol: Vital High Protein @ 40 mL/hr with 30 mL Prostat BID which provides 1160 kcal, 114 grams of protein, and 803 mL free water.  - Will order Glucerna 1.2 @ 30 mL/hr with 30 mL Prostat BID to provide (+kcal from IVF) 1574 kcal, 73 grams of protein, and 580 mL free water.  - Pt has lost 13 lbs (9% body weight) in the past 1 month which is significant for time frame. - Physical assessment shows no muscle or fat wasting at this time. Pt with bilateral mitten restraints in place and cast to R lower arm.   Patient is currently intubated on ventilator support with OGT in place.  MV: 15.9 L/min Temp (24hrs), Avg:98.4 F (36.9 C), Min:97.7 F (36.5 C), Max:98.7 F (37.1 C) Propofol: none  Drips: Levo @ 12 mcg/min, Precedex @ 1 mcg/kg/hr.  IVF: D5-150 mEq sodium bicarb @ 125 mL/hr (510 kcal)/     Diet Order:  Diet NPO time specified  Skin:  Wound (see comment) (Stage 1 sacral pressure injury)  Last BM:  8/9  Height:   Ht Readings from Last 1 Encounters:  12/18/15 5\' 3"  (1.6 m)  Weight:   Wt Readings from Last 1 Encounters:  12/19/15 132 lb 4.4 oz (60 kg)    Ideal Body Weight:  52.27 kg  BMI:  Body mass index is 23.43 kg/m.  Estimated Nutritional Needs:   Kcal:  1500-1700  Protein:  60-72 grams (1-1.2 grams/kg)  Fluid:  1.5-1.7 L/day  EDUCATION NEEDS:   No education needs identified at this time    Jarome Matin, MS, RD, LDN Inpatient Clinical Dietitian Pager # [phone] After hours/weekend pager # [phone]

## 2015-12-19 NOTE — Progress Notes (Signed)
Pharmacy Antibiotic Note  [name] is a 55 y.o. female admitted on 12/17/2015 with aspiration pna. Pharmacy has been consulted for unasyn dosing.  Today, 12/19/2015  Day #2 antibiotics  Renal: Scr improved  Tm = 102  Plan:  Continue Unasyn 3 Gm IV q6h  Height: 5\' 3"  (160 cm) Weight: 132 lb 4.4 oz (60 kg) IBW/kg (Calculated) : 52.4  Temp (24hrs), Avg:100.4 F (38 C), Min:98.8 F (37.1 C), Max:102 F (38.9 C)   Recent Labs Lab 12/17/15 1544  12/18/15 0227 12/18/15 0443 12/18/15 0910 12/18/15 1545 12/18/15 2000 12/18/15 2345 12/18/15 2350 12/19/15 0450 12/19/15 0737  WBC 20.1*  --   --  5.6  --   --   --   --   --   --   --   CREATININE 1.23*  < >  --  1.68* 1.80* 1.61* 1.37*  --  1.18* 1.03* 0.94  LATICACIDVEN  --   < > 2.0*  --  5.3* 5.9*  --  3.0*  --   --  1.9  < > = values in this interval not displayed.  Estimated Creatinine Clearance: 55.9 mL/min (by C-G formula based on SCr of 0.94 mg/dL).    Allergies  Allergen Reactions  . Depakote [Divalproex Sodium] Anaphylaxis  . Diazepam Other (See Comments)    Died on operating table  . Valium Anaphylaxis  . Tizanidine Hcl Other (See Comments)    "passed out"    Antimicrobials this admission: unasyn 8/10 >>   Dose adjustments this admission:   Microbiology results: 8/9 BCx: NGTD 8/9 sputum: (GS = GPC clusters) - re-incubated 8/9 MRSA PCR: positive Thank you for allowing pharmacy to be a part of this patient's care.  Doreene Eland, PharmD, BCPS.   Pager: [phone] 12/19/2015 11:27 AM

## 2015-12-19 NOTE — Progress Notes (Signed)
[facility] Progress Note Patient Name: [name] DOB: 1960-07-06 MRN: [id number]   Date of Service  12/19/2015  HPI/Events of Note  Multiple issues: 1. Multiple watery stools and 2. K+ = 3.1 and Creatinine = 1.05.   eICU Interventions  Will order: 1. C. Difficile PCR. 2. Enteric precautions. 3. Replete K+.      Intervention Category Intermediate Interventions: Electrolyte abnormality - evaluation and management Minor Interventions: Clinical assessment - ordering diagnostic tests  Lysle Dingwall 12/19/2015, 6:55 PM

## 2015-12-19 NOTE — Progress Notes (Signed)
Echocardiogram 2D Echocardiogram has been performed.  [name] 12/19/2015, 3:45 PM

## 2015-12-19 NOTE — Progress Notes (Signed)
[facility] Progress Note Patient Name: [name] DOB: 1960/07/18 MRN: [id number]   Date of Service  12/19/2015  HPI/Events of Note  Note ABG as below.   Recent Labs Lab 12/17/15 1740 12/17/15 2304 12/18/15 0300 12/18/15 1315 12/19/15 0145  PHART  --  6.880* 7.089* 7.471* 7.530*  PCO2ART  --  39.0 20.7* 24.7* 34.0*  PO2ART  --  101* 154* 219* 95.3  HCO3 5.8* 6.9* 6.0* 17.8* 28.2*  TCO2 6.1 7.5 6.0 18.5 25.6  O2SAT 56.2 93.1 98.5 100.0 98.6   Hypokalemia on labs 23:50  eICU Interventions  D/c bicarb gtt Decrease RR from 20 to 18 Recheck am ABG Replace K+     Intervention Category Major Interventions: Acid-Base disturbance - evaluation and management  Kristi Hyer S. 12/19/2015, 2:07 AM

## 2015-12-19 NOTE — Progress Notes (Signed)
ANTICOAGULATION CONSULT NOTE - [facility] for heparin Indication: chest pain/ACS  Allergies  Allergen Reactions  . Depakote [Divalproex Sodium] Anaphylaxis  . Diazepam Other (See Comments)    Died on operating table  . Valium Anaphylaxis  . Tizanidine Hcl Other (See Comments)    "passed out"    Patient Measurements: Height: 5\' 3"  (160 cm) Weight: 132 lb 4.4 oz (60 kg) IBW/kg (Calculated) : 52.4 Heparin Dosing Weight: 60kg  Vital Signs: Temp: 99.4 F (37.4 C) (08/11 1630) Temp Source: Oral (08/11 1630) BP: 95/70 (08/11 1800) Pulse Rate: 95 (08/11 1700)  Labs:  Recent Labs  12/17/15 1544  12/18/15 0443  12/19/15 0450 12/19/15 0737 12/19/15 1015 12/19/15 1438 12/19/15 1610  HGB 13.7  --  10.1*  --   --   --   --   --   --   HCT 42.8  --  30.4*  --   --   --   --   --   --   PLT 492*  --  217  --   --   --   --   --   --   APTT  --   --   --   --   --   --  27  --   --   LABPROT  --   --   --   --   --   --  15.7*  --   --   INR  --   --   --   --   --   --  1.25  --   --   HEPARINUNFRC  --   --   --   --   --   --   --   --  0.16*  CREATININE 1.23*  < > 1.68*  < > 1.03* 0.94  --   --  1.05*  TROPONINI  --   < > 0.10*  < > 21.83* 18.59*  --  9.89*  --   < > = values in this interval not displayed.  Estimated Creatinine Clearance: 50.1 mL/min (by C-G formula based on SCr of 1.05 mg/dL).   Assessment: 58 yoF w/ hx of heavy alchohol use recently d/c from hospital s/p cardiac arrest 8/9 hs.  Concerns for possible ACS with significantly increased troponin.  Adding heparin per pharmacy, statin and ASA. No beta blocker for hypotension and vasopressor use  Baseline INR = 1.25, aPTT = 27sec  Today, 12/19/2015   Heparin level = 0.16, subtherapeutic on heparin infusion at 700 units/hr  CBC: 8/10 am - Hgb decreased from admission, Ptlc WNL  Renal : SCr 1.05  No bleeding or complications of therapy noted by nursing  Goal of Therapy:  Heparin  level 0.3-0.7 units/ml Monitor platelets by anticoagulation protocol: Yes   Plan:  Give 1500 unit IV heparin bolus x 1 Increase heparin infusion to 900 units/hr Check anti-Xa level 6 hours after rate change. Daily anti-Xa level, CBC while on heparin infusion. Monitor closely for s/sx of bleeding.    Lindell Spar, PharmD, BCPS Pager: [phone] 12/19/2015 6:27 PM

## 2015-12-19 NOTE — Consult Note (Addendum)
Cardiology Consult    Patient ID: [name] MRN: [id number], DOB/AGE: 05/11/1960   Admit date: 12/17/2015 Date of Consult: 12/19/2015  Primary Physician: Anthoney Harada, MD Reason for Consult: Elevated Troponin Primary Cardiologist: Dr. Marlou Porch Requesting Provider: Dr. Lake Bells   History of Present Illness    [name] is a 55 y.o. female with past medical history of ASD (s/p repair along with mitral valve repair), prior PE, HLD, depression, alcohol abuse and recent admission for starvation ketoacidosis (from 7/29 - 7/30-2017) who presented to [facility] ED on 12/17/2015 for persistent nausea and vomiting starting earlier that day.   In review of notes, she reported drinking Vodka for the past several days and not consuming any food or taking her medications.   In the ED, she was noted to be tachycardiac into the 120's. EKG shows sinus tachycardia, HR 137, with new LBBB when compared to previous tracings.  WBC 20.1, Hgb 13.7, platelets 492. Na+ 140, K+ 3.8, creatinine 1.23. Lipase 42. Ethanol 140. Initial troponin negative. Lactic Acid 12.19. Blood gas with pH 6.96, pCO2 27.3, O2 38.7, consistent with metabolic acidosis. CXR without any acute cardiopulmonary abnormalities.   Upon being transferred to the ICU, she began to say she could not breathe after being given an ASA and something to drink. She went bradycardiac and became unresponsive. Code Blue was initiated and CPR was performed for 6 minutes with 1 dose of Epinephrine with ROSC. Intubated at that time.  Cyclic troponin values have been obtained and are 0.03, 0.10, 7.89, 13.88, and 21.83.  Lactic Acid has improved to 1.9. Repeat CXR shows RLL PNA and now with progressive bibasilar atelectasis and infiltrates, consistent with aspiration PNA. Has been hypotensive with most recent BP at 94/64 on Levophed 4 mcg/min.   During the encounter today, she remains intubated but is able to nod yes/no to questions along with writing responses  on a notepad. She denies any recent chest discomfort or dyspnea with exertion. 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 She denies any pain at this current time. Is expressing she wants to be extubated.   Past Medical History   Past Medical History:  Diagnosis Date  . Allergy    SEASONAL  . Anemia   . Anxiety   . Arthritis   . Cardiomyopathy   . Deaf   . Depression   . Fibromyalgia   . Gastritis   . GERD (gastroesophageal reflux disease)   . Hearing impairment   . Hypertension    [name], take htn medication to regulate heart beat.  . Migraine   . Osteoporosis   . Pulmonary embolism ([place])    occured post c-section of her daughter  . Sleep apnea   . Vitamin D deficiency     Past Surgical History:  Procedure Laterality Date  . APPENDECTOMY    . CARDIAC SURGERY  January 2007   mitral valve repair  . CARDIAC SURGERY  1973   atrial septal defect  . CARDIAC SURGERY    . COCHLEAR IMPLANT    . INNER EAR SURGERY     TUBES  . LAPAROSCOPIC CHOLECYSTECTOMY  2012  . TONSILLECTOMY AND ADENOIDECTOMY    . TUBAL LIGATION       Allergies  Allergies  Allergen Reactions  . Depakote [Divalproex Sodium] Anaphylaxis  . Diazepam Other (See Comments)    Died on operating table  . Valium  Anaphylaxis  . Tizanidine Hcl Other (See Comments)    "passed out"    Inpatient Medications    . ampicillin-sulbactam (UNASYN) IV  3 g Intravenous Q6H  . antiseptic oral rinse  7 mL Mouth Rinse QID  . aspirin  81 mg Per Tube Daily  . chlorhexidine gluconate (SAGE KIT)  15 mL Mouth Rinse BID  . Chlorhexidine Gluconate Cloth  6 each Topical Q0600  . famotidine (PEPCID) IV  20 mg Intravenous Q12H  . feeding supplement (GLUCERNA 1.2 CAL)  1,000 mL Per Tube Q24H  . feeding supplement (PRO-STAT SUGAR FREE 64)  30 mL Per Tube BID  . folic acid  1 mg Per Tube Daily  . multivitamin  15 mL Per Tube Daily  .  mupirocin ointment  1 application Nasal BID  . sodium chloride flush  3 mL Intravenous Q12H  . sodium phosphate  Dextrose 5% IVPB  30 mmol Intravenous Once  . thiamine  100 mg Per Tube Daily    Family History    Family History  Problem Relation Age of Onset  . Breast cancer Mother     bilateral; ages 31 and 62; TAH/BSO ~50  . Depression Sister   . Heart disease Father   . Hypertension Father   . Colon cancer Neg Hx   . Esophageal cancer Neg Hx   . Pancreatic cancer Neg Hx   . Rectal cancer Neg Hx   . Stomach cancer Neg Hx     Social History    Social History   Social History  . Marital status: Single    Spouse name: [name]  . Number of children: [name]  . Years of education: [name]   Occupational History  . Not on file.   Social History Main Topics  . Smoking status: Never Smoker  . Smokeless tobacco: Never Used  . 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  . Drug use: No  . Sexual activity: Yes    Birth control/ protection: Surgical     Comment: 1st intercourse- 19, partners- 6   Other Topics Concern  . Not on file   Social History Narrative   Lives with 62 year old daughter.         Review of Systems    General:  No chills, fever, night sweats or weight changes.  Cardiovascular:  No chest pain, dyspnea on exertion, edema, orthopnea, palpitations, paroxysmal nocturnal dyspnea. Dermatological: No rash, lesions/masses Respiratory: No cough, dyspnea Urologic: No hematuria, dysuria Abdominal:   No diarrhea, bright red blood per rectum, melena, or hematemesis. Positive for nausea and vomiting.  Neurologic:  No visual changes, wkns, changes in mental status. All other systems reviewed and are otherwise negative except as noted above.  Physical Exam    Blood pressure 116/80, pulse 86, temperature (!) 102 F (38.9 C), resp. rate 20, height '5\' 3"'  (1.6 m), weight 132 lb 4.4 oz (60 kg), SpO2 100 %.  General: Pleasant,  Caucasian female, currently intubated, but awake and alert. Able to nod yes/no to questions and writes answers on notepad. Psych: Normal affect. Neuro: Alert and oriented X 3. Moves all extremities spontaneously. HEENT: Normal  Neck: Supple without bruits or JVD. Lungs:  Resp regular and unlabored, CTA without wheezing or rales. Currently intubated. Heart: RRR no s3, s4, 2/6 SEM at [place]. Abdomen: Soft, non-tender, non-distended, BS + x 4.  Extremities: No clubbing, cyanosis or edema. DP/PT/Radials 2+ and equal bilaterally.  Labs    Troponin [facility] Test)  Recent Labs  12/17/15 1550  TROPIPOC 0.01    Recent Labs  12/18/15 0443 12/18/15 1039 12/18/15 2145 12/19/15 0450  TROPONINI 0.10* 7.89* 13.88* 21.83*   Lab Results  Component Value Date   WBC 5.6 12/18/2015   HGB 10.1 (L) 12/18/2015   HCT 30.4 (L) 12/18/2015   MCV 96.2 12/18/2015   PLT 217 12/18/2015    Recent Labs Lab 12/17/15 1544  12/19/15 0737  NA 140  < > 141  K 3.8  < > 3.3*  CL 98*  < > 105  CO2 7*  < > 28  BUN 17  < > 16  CREATININE 1.23*  < > 0.94  CALCIUM 8.1*  < > 7.1*  PROT 8.1  --   --   BILITOT 0.4  --   --   ALKPHOS 130*  --   --   ALT 34  --   --   AST 77*  --   --   GLUCOSE 76  < > 140*  < > = values in this interval not displayed. Lab Results  Component Value Date   CHOL 213 (H) 07/23/2013   HDL 76 07/23/2013   LDLCALC 112 (H) 07/23/2013   TRIG 125 07/23/2013   Lab Results  Component Value Date   DDIMER 0.32 05/20/2014     Radiology Studies    Ct Abdomen Pelvis Wo Contrast  Result Date: 12/06/2015 CLINICAL DATA:  55 year old female with abdominal pain EXAM: CT ABDOMEN AND PELVIS WITHOUT CONTRAST TECHNIQUE: Multidetector CT imaging of the abdomen and pelvis was performed following the standard protocol without IV contrast. COMPARISON:  Abdominal CT dated 10/06/2015 and ultrasound dated 10/21/2015 FINDINGS: Evaluation of this exam is limited in the absence of intravenous  contrast. Evaluation is also limited due to respiratory motion artifact. There is cardiomegaly. Mechanical mitral valve. There is hypoattenuation of the cardiac blood pool suggestive of a degree of anemia. Clinical correlation is recommended. There is slight prominence of the pulmonary vasculature is and the visualized lung bases which may represent mild congestive changes. No intra-abdominal free air or free fluid. Cholecystectomy. Fatty liver. The pancreas, spleen, adrenal glands, kidneys, visualized ureters, and urinary bladder appear unremarkable. The uterus is anteverted and grossly unremarkable. There is sigmoid diverticulosis without active inflammatory changes. There is a small hiatal hernia. There is no evidence of bowel obstruction or active inflammation. There is mild aortoiliac atherosclerotic disease. The abdominal aorta and IVC are grossly unremarkable on this noncontrast study. No portal venous gas identified. There is no adenopathy. There is a small fat containing umbilical hernia. The abdominal wall soft tissues are otherwise unremarkable. There is degenerative changes of the spine. Scoliosis. Multilevel endplate irregularity and disc space narrowing. There is a chronic appearing 5 mm retropulsed fragment from the posterior aspect of the superior endplate of the L1 vertebrae. No acute fracture. IMPRESSION: No acute intra-abdominal pelvic pathology. Diverticulosis. Fatty liver. Electronically Signed   By: Anner Crete M.D.   On: 12/06/2015 06:44  Dg Chest 2 View  Result Date: 12/17/2015 CLINICAL DATA:  Shortness of breath, nausea and vomiting beginning this morning. EXAM: CHEST  2 VIEW COMPARISON:  PA and lateral chest 07/17/2015. FINDINGS: The lungs are clear. Heart size is normal. The patient is status post mitral valve repair. No pneumothorax or pleural effusion. Remote T12 compression fracture is seen. IMPRESSION: No acute disease. Electronically Signed   By: Inge Rise M.D.   On:  12/17/2015 15:23   Dg Forearm Right  Result Date: 12/04/2015 CLINICAL DATA:  Fall today with right distal forearm and wrist pain with swelling and bruising. EXAM: RIGHT FOREARM - 2 VIEW COMPARISON:  None. FINDINGS: Comminuted fracture the distal radial metaphysis extends to the articular surface apex anterior angulation noted at fracture site. No associated ulnar fracture is identified. IMPRESSION: Comminuted distal radius fracture with intra-articular extension. Electronically Signed   By: Misty Stanley M.D.   On: 12/04/2015 19:16  Dg Wrist Complete Right  Result Date: 12/04/2015 CLINICAL DATA:  Fall earlier today with wrist pain and swelling. EXAM: RIGHT WRIST - COMPLETE 3+ VIEW COMPARISON:  None. FINDINGS: Comminuted fracture of the distal radius identified with intra-articular extension. No associated fracture the distal ulna. Carpal alignment is anatomic. There are some degenerative changes at the first carpometacarpal joint and also at the first MCP joint. IMPRESSION: Comminuted distal radius fracture with intra-articular extension. Electronically Signed   By: Misty Stanley M.D.   On: 12/04/2015 19:17  Dg Abd 1 View  Result Date: 12/18/2015 CLINICAL DATA:  OG tube placement. EXAM: ABDOMEN - 1 VIEW COMPARISON:  12/06/2015 FINDINGS: An OG tube is identified with tip overlying the distal stomach. The bowel gas pattern is unremarkable. Defibrillator pads overlying the lower chest noted. IMPRESSION: OG tube with tip overlying the distal stomach. Electronically Signed   By: Margarette Canada M.D.   On: 12/18/2015 01:15   Dg Chest Port 1 View  Result Date: 12/19/2015 CLINICAL DATA:  Pneumonia. EXAM: PORTABLE CHEST 1 VIEW COMPARISON:  12/18/2015. FINDINGS: Endotracheal tube, NG tube, left IJ line stable position. Prior cardiac valve repair. Stable cardiomegaly. Progressive bibasilar atelectasis and infiltrates. Small right pleural effusion. No pneumothorax . IMPRESSION: 1. Lines and tubes in stable position.  2. Progressive bibasilar atelectasis and infiltrates. Small right pleural effusion. 3. No pneumothorax. Electronically Signed   By: Marcello Moores  Register   On: 12/19/2015 07:04   Dg Chest Port 1 View  Result Date: 12/18/2015 CLINICAL DATA:  Pneumonia, followup, intubation, central line, history cardiomyopathy, pulmonary embolism, hypertension, GERD EXAM: PORTABLE CHEST 1 VIEW COMPARISON:  Portable exam 0943 hours compared 12/17/2015 FINDINGS: Tip of endotracheal tube projects 5.9 cm above carina. Nasogastric tube extends into stomach. LEFT jugular central venous catheter tip projects over SVC near cavoatrial junction. External pacing leads project over chest. Enlargement of cardiac silhouette post MVR. Mediastinal contours normal vascularity normal. Persistent RIGHT middle lobe consolidation consistent with pneumonia. Question developing LEFT lower lobe consolidation as well. Remaining lungs clear. No pleural effusion or pneumothorax. IMPRESSION: Persistent RIGHT middle lobe pneumonia with question developing infiltrate versus atelectasis in LEFT lower lobe. Line and tube positions as above. Electronically Signed   By: Lavonia Dana M.D.   On: 12/18/2015 11:13   Dg Chest Port 1 View  Result Date: 12/18/2015 CLINICAL DATA:  Central line placement.  Initial encounter. EXAM: PORTABLE CHEST 1 VIEW COMPARISON:  Chest radiograph performed earlier today at 10:32 p.m. FINDINGS: The patient's left IJ line is noted ending about the distal SVC. The patient's endotracheal tube is seen ending 2-3 cm above the carina. The right lower lobe airspace opacification is compatible with pneumonia, similar in appearance to the prior study. No pleural effusion or pneumothorax is seen. The cardiomediastinal silhouette is mildly enlarged. The patient is status post median sternotomy. A valve replacement is noted. External pacing pads are seen. No acute osseous abnormalities are identified. IMPRESSION: 1. Left IJ line noted ending about the  distal SVC. 2. Endotracheal tube  seen ending 2-3 cm above the carina. 3. Right lower lobe pneumonia, similar in appearance to the recent prior study. 4. Mild cardiomegaly. Electronically Signed   By: Garald Balding M.D.   On: 12/18/2015 00:17   Dg Chest Port 1 View  Result Date: 12/17/2015 CLINICAL DATA:  CPR and intubation. EXAM: PORTABLE CHEST 1 VIEW COMPARISON:  12/17/2015. FINDINGS: Endotracheal tube is now identified with tip 2.3 cm above the carina. New dense consolidation/opacity throughout the medial right mid and lower lung noted. There is no evidence of pleural effusion or pneumothorax. Defibrillator pad overlying the lower chest noted. Cardiomegaly and cardiac valve replacement again identified. IMPRESSION: Endotracheal tube with tip 2.3 cm above the carina. New dense consolidations/opacity within the medial right mid and lower lung. Electronically Signed   By: Margarette Canada M.D.   On: 12/17/2015 23:00   Dg Abdomen Acute W/chest  Result Date: 12/06/2015 CLINICAL DATA:  55 year old female with abdominal pain EXAM: DG ABDOMEN ACUTE W/ 1V CHEST COMPARISON:  Chest radiograph dated 10/23/2015 FINDINGS: The lungs are clear. No pleural effusion or pneumothorax. Stable mild cardiomegaly. Median sternotomy wires and CABG vascular clips. Mechanical cardiac valve. There is no bowel dilatation or evidence of obstruction. No free air or radiopaque calculi. Right upper quadrant cholecystectomy clips. There is degenerative changes of the spine and scoliosis. No acute fracture. IMPRESSION: No acute cardiopulmonary process. No bowel obstruction. Electronically Signed   By: Anner Crete M.D.   On: 12/06/2015 03:31   EKG & Cardiac Imaging    EKG: Sinus tachycardia, HR 137, with new LBBB when compared to previous tracings  Echocardiogram: 10/2015 Study Conclusions  - Left ventricle: The cavity size was normal. Wall thickness was   normal. Systolic function was normal. The estimated ejection   fraction  was in the range of 55% to 60%. Wall motion was normal;   there were no regional wall motion abnormalities. Features are   consistent with a pseudonormal left ventricular filling pattern,   with concomitant abnormal relaxation and increased filling   pressure (grade 2 diastolic dysfunction). - Mitral valve: Calcified annulus. - Left atrium: The atrium was mildly dilated. - Right ventricle: The cavity size was mildly dilated. Wall   thickness was normal. Systolic function was mildly reduced. - Right atrium: The atrium was moderately dilated. - Tricuspid valve: There was moderate regurgitation. - Pulmonary arteries: Systolic pressure was mildly increased. PA   peak pressure: 43 mm Hg (S).  Assessment & Plan    1. NSTEMI - patient presented with nausea and vomiting in the setting of consuming alcohol in excess. Labs on admission consistent with metabolic acidosis and lactic acidosis. Had a cardiac arrest on admission thought to be secondary to aspiration PNA as this occurred after taking a PO medication. Required CPR for 6 minutes and 1 dose of epinephrine with ROSC.  - cyclic troponin values obtained and have been 0.03, 0.10, 7.89, 13.88, and 21.83. EKG on admission showed sinus tachycardia, HR 137, with a new LBBB.  - last echocardiogram in 10/2015 showed an EF of 55-60% with no wall motion abnormalities.  - history is somewhat limited by her current intubation, but when asked about recent chest discomfort or dyspnea with exertion, she denies this. Says she has esophageal spasms but no acute changes in her pain.  - with her significantly elevated troponin, recent cardiac arrest, and new LBBB  I feel like a cardiac catheterization is  warranted later this admission once her current clinical condition improves, however her  chronic alcohol use and medication noncompliance is certainly a concern. Would not be a good DAPT candidate, possibly a candidate for LEADERS FREE study? - obtain repeat  echocardiogram and EKG. Recommend initiation of ASA, statin, and Heparin per pharmacy consult.  BP does not allow for addition of BB at this time.   2. ASD/ Mitral Valve Repair - s/p ASD repair in 1973, mitral valve repair in 2007 - repeat echocardiogram pending.   3. Aspiration PNA - currently intubated, possible extubation later today by CCM. - currently on Unasyn - per admitting team  4. Metabolic Acidosis/ Lactic Acidosis - lactic acid 12.19 on admission, improved to 1.9 today. - per admitting team  5. Chronic Normocytic Anemia - Hgb variable from 9.1 - 15.0 this year. 13.7 on admission, 10.1 on 8/10. - continue to monitor. No evidence of active bleeding on exam.   6. Alcohol Abuse/ Medication Noncompliance - has reported consuming nothing but Vodka in the days leading up to her hospital admission. Not taking any medications. - concern for medication compliance going forward.   Signed, Erma Heritage, PA-C 12/19/2015, 8:54 AM Pager: [phone]   I have examined the patient and reviewed assessment and plan and discussed with patient.  Agree with above as stated.  Just extubated. No chest pressure.  She has had esophageal issues in the past which complicates how she judges her sx.  I personally reviewed her ECG.  I think she had atrial flutter 2:1 conduction on 8/9 when HR was 137.  Now back in NSR.  CP free with high troponin.  Cr. Better.  Plan for echo to eval for RWMA. Higher troponin than usual for demand ischemia.  Would likely need an ischemic eval in the future but she would have to be stable from a medical standpoint and be compliant with meds, particularly DAPT.  Known congential heart disease.  Larae Grooms

## 2015-12-19 NOTE — Progress Notes (Signed)
ANTICOAGULATION CONSULT NOTE - Initial Consult  Pharmacy Consult for heparin Indication: chest pain/ACS  Allergies  Allergen Reactions  . Depakote [Divalproex Sodium] Anaphylaxis  . Diazepam Other (See Comments)    Died on operating table  . Valium Anaphylaxis  . Tizanidine Hcl Other (See Comments)    "passed out"    Patient Measurements: Height: 5\' 3"  (160 cm) Weight: 132 lb 4.4 oz (60 kg) IBW/kg (Calculated) : 52.4 Heparin Dosing Weight: 60kg  Vital Signs: BP: 94/64 (08/11 0900) Pulse Rate: 87 (08/11 0900)  Labs:  Recent Labs  12/17/15 1544  12/18/15 0443  12/18/15 1039  12/18/15 2145 12/18/15 2350 12/19/15 0450 12/19/15 0737  HGB 13.7  --  10.1*  --   --   --   --   --   --   --   HCT 42.8  --  30.4*  --   --   --   --   --   --   --   PLT 492*  --  217  --   --   --   --   --   --   --   CREATININE 1.23*  < > 1.68*  < >  --   < >  --  1.18* 1.03* 0.94  TROPONINI  --   < > 0.10*  --  7.89*  --  13.88*  --  21.83*  --   < > = values in this interval not displayed.  Estimated Creatinine Clearance: 55.9 mL/min (by C-G formula based on SCr of 0.94 mg/dL).   Medical History: Past Medical History:  Diagnosis Date  . Allergy    SEASONAL  . Anemia   . Anxiety   . Arthritis   . Cardiomyopathy   . Deaf   . Depression   . Fibromyalgia   . Gastritis   . GERD (gastroesophageal reflux disease)   . Hearing impairment   . Hypertension    [name], take htn medication to regulate heart beat.  . Migraine   . Osteoporosis   . Pulmonary embolism ([place])    occured post c-section of her daughter  . Sleep apnea   . Vitamin D deficiency     Assessment: 67 yoF w/ hx of heavy alchohol use recently d/c from hospital s/p cardiac arrest 8/9 hs.  Concerns for possible ACS with significantly increased troponin.  Adding heparin per pharmacy, statin and ASA. No beta blocker for hypotension and vasopressor use  Baseline INR = 1.71m APTT = 27sec  Today, 12/19/2015   CBC:8/10  am - Hgb decreased from admission, ptlc WNL  Remains on norephinephrine (weaning)  Renal : Scr to WNL  No longer on enoxaparin for VTE prophylaxis  Goal of Therapy:  Heparin level 0.3-0.7 units/ml Monitor platelets by anticoagulation protocol: Yes   Plan:  Give 3000 units bolus x 1 Start heparin infusion at 700 units/hr Check anti-Xa level in 6 hours and daily while on heparin Continue to monitor H&H and platelets  Doreene Eland, PharmD, BCPS.   Pager: [phone] 12/19/2015 9:43 AM

## 2015-12-20 ENCOUNTER — Inpatient Hospital Stay (HOSPITAL_COMMUNITY): Payer: Medicare Other

## 2015-12-20 DIAGNOSIS — E872 Acidosis: Secondary | ICD-10-CM

## 2015-12-20 DIAGNOSIS — J690 Pneumonitis due to inhalation of food and vomit: Principal | ICD-10-CM

## 2015-12-20 DIAGNOSIS — F1010 Alcohol abuse, uncomplicated: Secondary | ICD-10-CM

## 2015-12-20 DIAGNOSIS — I214 Non-ST elevation (NSTEMI) myocardial infarction: Secondary | ICD-10-CM

## 2015-12-20 DIAGNOSIS — J189 Pneumonia, unspecified organism: Secondary | ICD-10-CM

## 2015-12-20 DIAGNOSIS — E43 Unspecified severe protein-calorie malnutrition: Secondary | ICD-10-CM

## 2015-12-20 LAB — LIPID PANEL
Cholesterol: 149 mg/dL (ref 0–200)
HDL: 35 mg/dL — AB (ref >40)
LDL CALC: 81 mg/dL (ref 0–99)
TRIGLYCERIDES: 167 mg/dL — AB (ref <150)
Total CHOL/HDL Ratio: 4.3 RATIO
VLDL: 33 mg/dL (ref 0–40)

## 2015-12-20 LAB — GLUCOSE, CAPILLARY
GLUCOSE-CAPILLARY: 120 mg/dL — AB (ref 65–99)
GLUCOSE-CAPILLARY: 122 mg/dL — AB (ref 65–99)
GLUCOSE-CAPILLARY: 49 mg/dL — AB (ref 65–99)
GLUCOSE-CAPILLARY: 49 mg/dL — AB (ref 65–99)
GLUCOSE-CAPILLARY: 67 mg/dL (ref 65–99)
GLUCOSE-CAPILLARY: 82 mg/dL (ref 65–99)
Glucose-Capillary: 93 mg/dL (ref 65–99)
Glucose-Capillary: 89 mg/dL (ref 65–99)
Glucose-Capillary: 124 mg/dL — ABNORMAL HIGH (ref 65–99)
Glucose-Capillary: 104 mg/dL — ABNORMAL HIGH (ref 65–99)

## 2015-12-20 LAB — PHOSPHORUS: PHOSPHORUS: 2.5 mg/dL (ref 2.5–4.6)

## 2015-12-20 LAB — CULTURE, RESPIRATORY W GRAM STAIN: Special Requests: NORMAL

## 2015-12-20 LAB — CBC
HCT: 25.4 % — ABNORMAL LOW (ref 36.0–46.0)
HEMATOCRIT: 24.6 % — AB (ref 36.0–46.0)
HEMOGLOBIN: 8 g/dL — AB (ref 12.0–15.0)
Hemoglobin: 8.4 g/dL — ABNORMAL LOW (ref 12.0–15.0)
MCH: 30.8 pg (ref 26.0–34.0)
MCH: 31.3 pg (ref 26.0–34.0)
MCHC: 32.5 g/dL (ref 30.0–36.0)
MCHC: 33.1 g/dL (ref 30.0–36.0)
MCV: 94.6 fL (ref 78.0–100.0)
MCV: 94.8 fL (ref 78.0–100.0)
PLATELETS: 74 10*3/uL — AB (ref 150–400)
Platelets: 75 10*3/uL — ABNORMAL LOW (ref 150–400)
RBC: 2.6 MIL/uL — ABNORMAL LOW (ref 3.87–5.11)
RBC: 2.68 MIL/uL — AB (ref 3.87–5.11)
RDW: 17.6 % — AB (ref 11.5–15.5)
RDW: 17.5 % — ABNORMAL HIGH (ref 11.5–15.5)
WBC: 5.3 10*3/uL (ref 4.0–10.5)
WBC: 5.6 10*3/uL (ref 4.0–10.5)

## 2015-12-20 LAB — COMPREHENSIVE METABOLIC PANEL
ALT: 29 U/L (ref 14–54)
ANION GAP: 7 (ref 5–15)
AST: 93 U/L — AB (ref 15–41)
Albumin: 2.8 g/dL — ABNORMAL LOW (ref 3.5–5.0)
Alkaline Phosphatase: 68 U/L (ref 38–126)
BILIRUBIN TOTAL: 0.6 mg/dL (ref 0.3–1.2)
BUN: 12 mg/dL (ref 6–20)
CHLORIDE: 111 mmol/L (ref 101–111)
CO2: 27 mmol/L (ref 22–32)
Calcium: 7.2 mg/dL — ABNORMAL LOW (ref 8.9–10.3)
Creatinine, Ser: 0.93 mg/dL (ref 0.44–1.00)
Glucose, Bld: 90 mg/dL (ref 65–99)
POTASSIUM: 3.3 mmol/L — AB (ref 3.5–5.1)
Sodium: 145 mmol/L (ref 135–145)
TOTAL PROTEIN: 5.6 g/dL — AB (ref 6.5–8.1)

## 2015-12-20 LAB — HEPARIN LEVEL (UNFRACTIONATED)
HEPARIN UNFRACTIONATED: 0.36 [IU]/mL (ref 0.30–0.70)
Heparin Unfractionated: 0.12 IU/mL — ABNORMAL LOW (ref 0.30–0.70)
Heparin Unfractionated: 0.3 IU/mL (ref 0.30–0.70)
Heparin Unfractionated: 0.3 IU/mL (ref 0.30–0.70)

## 2015-12-20 LAB — CULTURE, RESPIRATORY: CULTURE: NORMAL

## 2015-12-20 LAB — MAGNESIUM: MAGNESIUM: 1.7 mg/dL (ref 1.7–2.4)

## 2015-12-20 MED ORDER — METOPROLOL TARTRATE 25 MG/10 ML ORAL SUSPENSION
12.5000 mg | Freq: Two times a day (BID) | ORAL | Status: DC
  Administered 2015-12-20: 12.5 mg via ORAL
  Filled 2015-12-20 (×2): qty 5

## 2015-12-20 MED ORDER — LORAZEPAM 2 MG/ML IJ SOLN
1.0000 mg | INTRAMUSCULAR | Status: DC | PRN
  Administered 2015-12-21: 2 mg via INTRAVENOUS
  Filled 2015-12-20: qty 1

## 2015-12-20 MED ORDER — DIPHENHYDRAMINE HCL 50 MG/ML IJ SOLN
12.5000 mg | Freq: Four times a day (QID) | INTRAMUSCULAR | Status: DC | PRN
  Filled 2015-12-20: qty 1

## 2015-12-20 MED ORDER — ADULT MULTIVITAMIN LIQUID CH
15.0000 mL | Freq: Every day | ORAL | Status: DC
  Administered 2015-12-22 – 2015-12-25 (×4): 15 mL via ORAL
  Filled 2015-12-20 (×6): qty 15

## 2015-12-20 MED ORDER — THIAMINE HCL 100 MG/ML IJ SOLN
100.0000 mg | Freq: Every day | INTRAMUSCULAR | Status: DC
  Administered 2015-12-20 – 2015-12-24 (×5): 100 mg via INTRAVENOUS
  Filled 2015-12-20 (×5): qty 2

## 2015-12-20 MED ORDER — METOPROLOL TARTRATE 5 MG/5ML IV SOLN
2.5000 mg | Freq: Four times a day (QID) | INTRAVENOUS | Status: DC
  Administered 2015-12-20 – 2015-12-21 (×2): 2.5 mg via INTRAVENOUS
  Filled 2015-12-20 (×2): qty 5

## 2015-12-20 MED ORDER — FOLIC ACID 5 MG/ML IJ SOLN
1.0000 mg | Freq: Every day | INTRAMUSCULAR | Status: DC
  Administered 2015-12-20 – 2015-12-24 (×5): 1 mg via INTRAVENOUS
  Filled 2015-12-20 (×5): qty 0.2

## 2015-12-20 MED ORDER — HEPARIN (PORCINE) IN NACL 100-0.45 UNIT/ML-% IJ SOLN
1000.0000 [IU]/h | INTRAMUSCULAR | Status: DC
  Administered 2015-12-20: 950 [IU]/h via INTRAVENOUS
  Filled 2015-12-20 (×3): qty 250

## 2015-12-20 NOTE — Progress Notes (Signed)
Subjective:  C/O chest wall soreness from CPR  Objective:   Vital Signs : Vitals:   12/20/15 0400 12/20/15 0615 12/20/15 0800 12/20/15 0900  BP:  127/84 (!) 133/96 129/87  Pulse:  (!) 101 (!) 102 (!) 101  Resp:  20 15 (!) 22  Temp: 98.6 F (37 C)  98.6 F (37 C)   TempSrc: Oral  Oral   SpO2:  95% 99% 96%  Weight:      Height:        Intake/Output from previous day:  Intake/Output Summary (Last 24 hours) at 12/20/15 1003 Last data filed at 12/20/15 0900  Gross per 24 hour  Intake           1415.3 ml  Output              650 ml  Net            765.3 ml    I/O since admission: +[phone]  Wt Readings from Last 3 Encounters:  12/20/15 151 lb 10.8 oz (68.8 kg)  12/09/15 135 lb (61.2 kg)  12/06/15 137 lb (62.1 kg)    Medications: . ampicillin-sulbactam (UNASYN) IV  3 g Intravenous Q6H  . antiseptic oral rinse  7 mL Mouth Rinse BID  . aspirin  81 mg Per Tube Daily  . atorvastatin  40 mg Oral q1800  . Chlorhexidine Gluconate Cloth  6 each Topical Q0600  . famotidine (PEPCID) IV  20 mg Intravenous Q12H  . feeding supplement (GLUCERNA 1.2 CAL)  1,000 mL Per Tube Q24H  . feeding supplement (PRO-STAT SUGAR FREE 64)  30 mL Per Tube BID  . folic acid  1 mg Per Tube Daily  . insulin aspart  1 Units Subcutaneous Q4H  . insulin glargine  10 Units Subcutaneous Q24H  . multivitamin  15 mL Per Tube Daily  . mupirocin ointment  1 application Nasal BID  . sodium chloride flush  3 mL Intravenous Q12H  . thiamine  100 mg Per Tube Daily    . sodium chloride 20 mL/hr at 12/19/15 1800  . dextrose 75 mL/hr at 12/20/15 0822  . heparin 950 Units/hr (12/20/15 0100)  . insulin (NOVOLIN-R) infusion Stopped (12/19/15 0924)  . norepinephrine (LEVOPHED) Adult infusion Stopped (12/19/15 1742)    Physical Exam:   General appearance: c/o chest wall pain Neck: no adenopathy, no JVD, supple, symmetrical, trachea midline and thyroid not enlarged, symmetric, no tenderness/mass/nodules Lungs:  no wheezing Heart: Tachycardic at 105; 1/6 sem; no s3 Abdomen: soft, non-tender; bowel sounds normal; no masses,  no organomegaly Extremities: R arm in hard cast Neurologic: Grossly normal   Rate: 105  Rhythm: sinus tachycardia  ECG (independently read by me): ST vs A Flutter at 137  Lab Results:   Recent Labs  12/18/15 1545  12/19/15 0450 12/19/15 0737 12/19/15 1610 12/20/15 0455  NA 137  < > 141 141 144 145  K 2.7*  < > 2.7* 3.3* 3.1* 3.3*  CL 103  < > 105 105 108 111  CO2 21*  < > '28 28 27 27  ' GLUCOSE 284*  < > 110* 140* 99 90  BUN 21*  < > '19 16 18 12  ' CREATININE 1.61*  < > 1.03* 0.94 1.05* 0.93  CALCIUM 6.7*  < > 7.1* 7.1* 7.2* 7.2*  MG 1.4*  --  2.3  --   --  1.7  PHOS <0.1*  --  1.1*  --  2.6 2.5  < > = values  in this interval not displayed.  Hepatic Function Latest Ref Rng & Units 12/20/2015 12/17/2015 12/06/2015  Total Protein 6.5 - 8.1 g/dL 5.6(L) 8.1 7.2  Albumin 3.5 - 5.0 g/dL 2.8(L) 4.8 4.2  AST 15 - 41 U/L 93(H) 77(H) 39  ALT 14 - 54 U/L 29 34 22  Alk Phosphatase 38 - 126 U/L 68 130(H) 88  Total Bilirubin 0.3 - 1.2 mg/dL 0.6 0.4 1.1  Bilirubin, Direct 0.1 - 0.5 mg/dL - - -     Recent Labs  12/17/15 1544 12/18/15 0443 12/20/15 0455  WBC 20.1* 5.6 5.3  HGB 13.7 10.1* 8.0*  HCT 42.8 30.4* 24.6*  MCV 98.2 96.2 94.6  PLT 492* 217 75*     Recent Labs  12/19/15 0737 12/19/15 1438 12/19/15 2038  TROPONINI 18.59* 9.89* 7.63*    Lab Results  Component Value Date   TSH 6.205 (H) 10/24/2015    Recent Labs  12/18/15 0910  HGBA1C 4.6*     Recent Labs  12/17/15 1544 12/20/15 0455  PROT 8.1 5.6*  ALBUMIN 4.8 2.8*  AST 77* 93*  ALT 34 29  ALKPHOS 130* 68  BILITOT 0.4 0.6    Recent Labs  12/19/15 1015  INR 1.25   BNP (last 3 results) No results for input(s): BNP in the last 8760 hours.  ProBNP (last 3 results) No results for input(s): PROBNP in the last 8760 hours.   Lipid Panel     Component Value Date/Time   CHOL 149  12/20/2015 0455   TRIG 167 (H) 12/20/2015 0455   HDL 35 (L) 12/20/2015 0455   CHOLHDL 4.3 12/20/2015 0455   VLDL 33 12/20/2015 0455   LDLCALC 81 12/20/2015 0455      Imaging:  Dg Chest Port 1 View  Result Date: 12/20/2015 CLINICAL DATA:  Patient with history of pneumonia and cardiomyopathy. EXAM: PORTABLE CHEST 1 VIEW COMPARISON:  Chest radiograph 12/19/2015 FINDINGS: Interval extubation and removal of enteric tube. Left IJ central venous catheter tip projects over the superior vena cava. Monitoring leads overlie the patient. Stable cardiomegaly. Interval worsening of airspace consolidation within the right mid and lower lung. Probable tiny bilateral pleural effusions. No pneumothorax. IMPRESSION: Interval increase in right mid and lower lung consolidation. Probable small bilateral pleural effusions. Interval extubation. Electronically Signed   By: Lovey Newcomer M.D.   On: 12/20/2015 07:17   Dg Chest Port 1 View  Result Date: 12/19/2015 CLINICAL DATA:  Pneumonia. EXAM: PORTABLE CHEST 1 VIEW COMPARISON:  12/18/2015. FINDINGS: Endotracheal tube, NG tube, left IJ line stable position. Prior cardiac valve repair. Stable cardiomegaly. Progressive bibasilar atelectasis and infiltrates. Small right pleural effusion. No pneumothorax . IMPRESSION: 1. Lines and tubes in stable position. 2. Progressive bibasilar atelectasis and infiltrates. Small right pleural effusion. 3. No pneumothorax. Electronically Signed   By: Marcello Moores  Register   On: 12/19/2015 07:04   Dg Chest Port 1 View  Result Date: 12/18/2015 CLINICAL DATA:  Pneumonia, followup, intubation, central line, history cardiomyopathy, pulmonary embolism, hypertension, GERD EXAM: PORTABLE CHEST 1 VIEW COMPARISON:  Portable exam 0943 hours compared 12/17/2015 FINDINGS: Tip of endotracheal tube projects 5.9 cm above carina. Nasogastric tube extends into stomach. LEFT jugular central venous catheter tip projects over SVC near cavoatrial junction. External  pacing leads project over chest. Enlargement of cardiac silhouette post MVR. Mediastinal contours normal vascularity normal. Persistent RIGHT middle lobe consolidation consistent with pneumonia. Question developing LEFT lower lobe consolidation as well. Remaining lungs clear. No pleural effusion or pneumothorax. IMPRESSION: Persistent RIGHT  middle lobe pneumonia with question developing infiltrate versus atelectasis in LEFT lower lobe. Line and tube positions as above. Electronically Signed   By: Lavonia Dana M.D.   On: 12/18/2015 11:13    Echocardiogram: 10/2015 Study Conclusions  - Left ventricle: The cavity size was normal. Wall thickness was normal. Systolic function was normal. The estimated ejection fraction was in the range of 55% to 60%. Wall motion was normal; there were no regional wall motion abnormalities. Features are consistent with a pseudonormal left ventricular filling pattern, with concomitant abnormal relaxation and increased filling pressure (grade 2 diastolic dysfunction). - Mitral valve: Calcified annulus. - Left atrium: The atrium was mildly dilated. - Right ventricle: The cavity size was mildly dilated. Wall thickness was normal. Systolic function was mildly reduced. - Right atrium: The atrium was moderately dilated. - Tricuspid valve: There was moderate regurgitation. - Pulmonary arteries: Systolic pressure was mildly increased. PA peak pressure: 43 mm Hg (S).   Assessment/Plan:   Principal Problem:   Metabolic acidosis Active Problems:   Alcohol dependence (HCC)   Major depression, chronic (HCC)   Acute kidney injury ([place])   Essential hypertension   Lactic acidosis   Leucocytosis   Dehydration   Starvation ketoacidosis   Alcohol abuse   Pressure ulcer   Protein-calorie malnutrition, severe   NSTEMI (non-ST elevated myocardial infarction) ([place])   Typical atrial flutter ([place])     1. NSTEMI - patient presented with nausea and  vomiting in the setting of consuming alcohol in excess. Labs on admission consistent with metabolic acidosis and lactic acidosis. Had a cardiac arrest on admission thought to be secondary to aspiration PNA as this occurred after taking a PO medication. Required CPR for 6 minutes and 1 dose of epinephrine with ROSC.  - cyclic troponin values obtained and have been 0.03, 0.10, 7.89, 13.88, and 21.83. EKG on admission showed sinus tachycardia, HR 137, with a new LBBB.  Troponin today decreased to 7.63. - last echocardiogram in 10/2015 showed an EF of 55-60% with no wall motion abnormalities.  - history is somewhat limited by her current intubation, but when asked about recent chest discomfort or dyspnea with exertion, she denies this. Says she has esophageal spasms but no acute changes in her pain.  - with her significantly elevated troponin, recent cardiac arrest, and new LBBB  I feel like a cardiac catheterization is  warranted later this admission once her current clinical condition improves, however her chronic alcohol use and medication noncompliance is certainly a concern. Would not be a good DAPT candidate, possibly a candidate for LEADERS FREE study? - obtain repeat echocardiogram and EKG. Recommend initiation of ASA, statin, and Heparin per pharmacy consult. - Will start liquid metoprolol 12.5 mg bid today since she does not feel that she can swallow pills due to esophagus motility issues.  Will repeat ECG today.  2. ASD/ Mitral Valve Repair - s/p ASD repair in 1973, mitral valve repair in 2007 - repeat echocardiogram pending.   3. Aspiration PNA - currently intubated, possible extubation later today by CCM. - currently on Unasyn - per admitting team  4. Metabolic Acidosis/ Lactic Acidosis - lactic acid 12.19 on admission, improved to 1.9 today. - per admitting team  5. Chronic Normocytic Anemia - Hgb variable from 9.1 - 15.0 this year. 13.7 on admission, 10.1 on 8/10. - continue to  monitor. No evidence of active bleeding on exam.   6. Alcohol Abuse/ Medication Noncompliance - has reported consuming nothing but Vodka in  the days leading up to her hospital admission. Not taking any medications. - concern for medication compliance going forward.   7. Hypokalema - K 3.3; supplement to 4.0 range; MG 1.7  8. Esophageal Dysmotility - Pt states that she cannot swallow pills. She can drink liquids.   9. Thrombocytopenia - Platelets 75K; no active bleeding ? ETOH toxicity  10. Abnormal LFTs - contributed by ETOH, hypotension  Troy Sine, MD, [facility] 12/20/2015, 10:03 AM

## 2015-12-20 NOTE — Progress Notes (Signed)
ANTICOAGULATION CONSULT NOTE - [place] for heparin Indication: chest pain/ACS  Allergies  Allergen Reactions  . Depakote [Divalproex Sodium] Anaphylaxis  . Diazepam Other (See Comments)    Died on operating table  . Valium Anaphylaxis  . Tizanidine Hcl Other (See Comments)    "passed out"    Patient Measurements: Height: 5\' 3"  (160 cm) Weight: 151 lb 10.8 oz (68.8 kg) IBW/kg (Calculated) : 52.4 Heparin Dosing Weight: 60kg  Vital Signs: Temp: 98.6 F (37 C) (08/12 0400) Temp Source: Oral (08/12 0400) BP: 127/84 (08/12 0615) Pulse Rate: 101 (08/12 0615)  Labs:  Recent Labs  12/17/15 1544  12/18/15 0443  12/19/15 0737 12/19/15 1015 12/19/15 1438 12/19/15 1610 12/19/15 2038 12/20/15 0010 12/20/15 0455 12/20/15 0600  HGB 13.7  --  10.1*  --   --   --   --   --   --   --  8.0*  --   HCT 42.8  --  30.4*  --   --   --   --   --   --   --  24.6*  --   PLT 492*  --  217  --   --   --   --   --   --   --  75*  --   APTT  --   --   --   --   --  27  --   --   --   --   --   --   LABPROT  --   --   --   --   --  15.7*  --   --   --   --   --   --   INR  --   --   --   --   --  1.25  --   --   --   --   --   --   HEPARINUNFRC  --   --   --   --   --   --   --  0.16*  --  0.30  --  0.36  CREATININE 1.23*  < > 1.68*  < > 0.94  --   --  1.05*  --   --  0.93  --   TROPONINI  --   < > 0.10*  < > 18.59*  --  9.89*  --  7.63*  --   --   --   < > = values in this interval not displayed.  Estimated Creatinine Clearance: 63.7 mL/min (by C-G formula based on SCr of 0.93 mg/dL).   Assessment: 75 yoF w/ hx of heavy alchohol use recently d/c from hospital s/p cardiac arrest 8/9 hs.  Concerns for possible ACS with significantly increased troponin.  Adding heparin per pharmacy, statin and ASA. No beta blocker for hypotension and vasopressor use  Baseline INR = 1.25, aPTT = 27sec  Today, 12/20/2015   Heparin level = 0.36, therapeutic on heparin infusion at  950 units/hr  CBC: reveals decreased Hgb and pltc down to 75   Renal : SCr WNL  No obvious bleeding or complications of therapy noted at this time  Goal of Therapy:  Heparin level 0.3-0.7 units/ml Monitor platelets by anticoagulation protocol: Yes   Plan:  Continue heparin infusion at 950 units/hr Check confirmatory anti-Xa level and CBC at noon Daily anti-Xa level, CBC while on heparin infusion. Monitor closely for s/sx of bleeding.   Doreene Eland, PharmD,  BCPS.   Pager: [phone] 12/20/2015 7:10 AM

## 2015-12-20 NOTE — Progress Notes (Addendum)
ANTICOAGULATION CONSULT NOTE - [place] for heparin Indication: chest pain/ACS  Allergies  Allergen Reactions  . Depakote [Divalproex Sodium] Anaphylaxis  . Diazepam Other (See Comments)    Died on operating table  . Valium Anaphylaxis  . Tizanidine Hcl Other (See Comments)    "passed out"    Patient Measurements: Height: 5\' 3"  (160 cm) Weight: 151 lb 10.8 oz (68.8 kg) IBW/kg (Calculated) : 52.4 Heparin Dosing Weight: 60kg  Vital Signs: Temp: 97.5 F (36.4 C) (08/12 1200) Temp Source: Oral (08/12 1200) BP: 130/86 (08/12 1300) Pulse Rate: 96 (08/12 1300)  Labs:  Recent Labs  12/18/15 0443  12/19/15 0737 12/19/15 1015 12/19/15 1438  12/19/15 1610 12/19/15 2038 12/20/15 0010 12/20/15 0455 12/20/15 0600 12/20/15 1145  HGB 10.1*  --   --   --   --   --   --   --   --  8.0*  --  8.4*  HCT 30.4*  --   --   --   --   --   --   --   --  24.6*  --  25.4*  PLT 217  --   --   --   --   --   --   --   --  75*  --  74*  APTT  --   --   --  27  --   --   --   --   --   --   --   --   LABPROT  --   --   --  15.7*  --   --   --   --   --   --   --   --   INR  --   --   --  1.25  --   --   --   --   --   --   --   --   HEPARINUNFRC  --   --   --   --   --   < > 0.16*  --  0.30  --  0.36 0.12*  CREATININE 1.68*  < > 0.94  --   --   --  1.05*  --   --  0.93  --   --   TROPONINI 0.10*  < > 18.59*  --  9.89*  --   --  7.63*  --   --   --   --   < > = values in this interval not displayed.  Estimated Creatinine Clearance: 63.7 mL/min (by C-G formula based on SCr of 0.93 mg/dL).   Assessment: 64 yoF w/ hx of heavy alchohol use recently d/c from hospital s/p cardiac arrest 8/9 hs.  Concerns for possible ACS with significantly increased troponin.  Adding heparin per pharmacy, statin and ASA. No beta blocker for hypotension and vasopressor use  Baseline INR = 1.25, aPTT = 27sec  Today, 12/20/2015   Heparin level = 0.12, now  subtherapeutic on heparin  infusion at 950 units/hr.  Heparin level was therapeutic this morning but decreased on confirmatory level  RN reports heparin off only 65min this am for tubing change.  Heparin now running into central line so will need peripheral stick  CBC: reveals decreased Hgb and pltc down to 75   Repeat CBC confirms this - suspect decrease is related to volume and being dehydrated at admission.  Previous pltc as low as 120's in past  Do no suspect HIT  due to timing - expect to see decline after 5-7 days of heparin (decline was < 24h from start of heparin)  Renal : SCr WNL  Patient reports to RN today (8/12)  that she has a slow nosebleed since 8/11  Dr. Halford Chessman aware of Hgb/platelets and minor bleeding - continue to monitor  Goal of Therapy:  Heparin level 0.3-0.7 units/ml Monitor platelets by anticoagulation protocol: Yes   Plan:   Continue heparin infusion at 950 units/hr  Repeat heparin level before making dose change to confirm not d/t error  Daily anti-Xa level, CBC while on heparin infusion.  Monitor closely for s/sx of bleeding.   Doreene Eland, PharmD, BCPS.   Pager: [phone] 12/20/2015 2:04 PM  ___________________  Adden: Repeat heparin level now back therapeutic at 0.3 (will adjust goal range to 0.3-0.5 d/t low platelets).  With level at lower end of goal range, will increase heparin drip up slightly to 1000 units/hr. RN reported that nosebleed has stopped at this time.  Will monitor cbc closely and bleeding events.  F/u with AM labs and adjust dose if needed Dia Sitter, PharmD, BCPS 12/20/2015 3:09 PM

## 2015-12-20 NOTE — Progress Notes (Signed)
PHARMACY - HEPARIN (brief note)  Patient on IV heparin for chest pain/ACS.  Heparin currently infusing @ 900 units/hr.    Heparin level = 0.3 (Goal 0000000) No complications of therapy noted  Assessment: Heparin level therapeutic; however at low end of goal  Plan: Increase heparin slightly to ensure therapeutic dose - will increase to 950 units/hr  Recheck heparin level in 6 hr   Leone Haven, PharmD

## 2015-12-20 NOTE — Progress Notes (Signed)
eLink Physician-Brief Progress Note Patient Name: [name] DOB: 18-Nov-1960 MRN: [id number]   Date of Service  12/20/2015  HPI/Events of Note  Patient on Metoprolol PO. Patient is NPO d/t esophageal dysmotility.   eICU Interventions  Will change Metoprolol to 2.5 mg IV Q 6 hours. Hold for SBP < 110 or HR < 60.     Intervention Category Intermediate Interventions: Hypertension - evaluation and management  Sommer,Steven Eugene 12/20/2015, 8:11 PM

## 2015-12-20 NOTE — Progress Notes (Addendum)
PULMONARY / CRITICAL CARE MEDICINE   Name: [name] MRN: [id number] DOB: [birth year]    ADMISSION DATE:  12/17/2015 CONSULTATION DATE:  12/17/2015  REFERRING MD:  Dr. Clementeen Graham, triad hospitalist  CHIEF COMPLAINT:  Cardiac arrest  SUBJECTIVE:  Wants something to drink.  C/o soreness in chest.  VITAL SIGNS: BP 129/87   Pulse (!) 101   Temp 98.6 F (37 C) (Oral)   Resp (!) 22   Ht 5\' 3"  (1.6 m)   Wt 151 lb 10.8 oz (68.8 kg)   SpO2 96%   BMI 26.87 kg/m   INTAKE / OUTPUT: I/O last 3 completed shifts: In: 3220.7 [I.V.:1675.7; NG/GT:115; IV Piggyback:1430] Out: 1250 [Urine:1250]  PHYSICAL EXAMINATION: General: alert Neuro: follows commands HEENT: poor dentition Cardiac: regular, no murmur Chest: no wheeze Abd: soft, non tender Ext: no edema Skin: no rashes  LABS:  BMET  Recent Labs Lab 12/19/15 0737 12/19/15 1610 12/20/15 0455  NA 141 144 145  K 3.3* 3.1* 3.3*  CL 105 108 111  CO2 28 27 27   BUN 16 18 12   CREATININE 0.94 1.05* 0.93  GLUCOSE 140* 99 90    Electrolytes  Recent Labs Lab 12/18/15 1545  12/19/15 0450 12/19/15 0737 12/19/15 1610 12/20/15 0455  CALCIUM 6.7*  < > 7.1* 7.1* 7.2* 7.2*  MG 1.4*  --  2.3  --   --  1.7  PHOS <0.1*  --  1.1*  --  2.6 2.5  < > = values in this interval not displayed.  CBC  Recent Labs Lab 12/17/15 1544 12/18/15 0443 12/20/15 0455  WBC 20.1* 5.6 5.3  HGB 13.7 10.1* 8.0*  HCT 42.8 30.4* 24.6*  PLT 492* 217 75*    Coag's  Recent Labs Lab 12/19/15 1015  APTT 27  INR 1.25    Sepsis Markers  Recent Labs Lab 12/18/15 1545 12/18/15 2345 12/19/15 0737  LATICACIDVEN 5.9* 3.0* 1.9    ABG  Recent Labs Lab 12/18/15 0300 12/18/15 1315 12/19/15 0145  PHART 7.089* 7.471* 7.530*  PCO2ART 20.7* 24.7* 34.0*  PO2ART 154* 219* 95.3    Liver Enzymes  Recent Labs Lab 12/17/15 1544 12/20/15 0455  AST 77* 93*  ALT 34 29  ALKPHOS 130* 68  BILITOT 0.4 0.6  ALBUMIN 4.8 2.8*    Cardiac  Enzymes  Recent Labs Lab 12/19/15 0737 12/19/15 1438 12/19/15 2038  TROPONINI 18.59* 9.89* 7.63*    Glucose  Recent Labs Lab 12/19/15 1937 12/19/15 2015 12/19/15 2337 12/20/15 0340 12/20/15 0448 12/20/15 0753  GLUCAP 49* 93 67 49* 82 89    Imaging Dg Chest Port 1 View  Result Date: 12/20/2015 CLINICAL DATA:  Patient with history of pneumonia and cardiomyopathy. EXAM: PORTABLE CHEST 1 VIEW COMPARISON:  Chest radiograph 12/19/2015 FINDINGS: Interval extubation and removal of enteric tube. Left IJ central venous catheter tip projects over the superior vena cava. Monitoring leads overlie the patient. Stable cardiomegaly. Interval worsening of airspace consolidation within the right mid and lower lung. Probable tiny bilateral pleural effusions. No pneumothorax. IMPRESSION: Interval increase in right mid and lower lung consolidation. Probable small bilateral pleural effusions. Interval extubation. Electronically Signed   By: Lovey Newcomer M.D.   On: 12/20/2015 07:17    STUDIES:  8/11 Echo >> EF 55 to 60%   MICROBIOLOGY: MRSA PCR 8/9:  Positive Blood Ctx x2 8/9 >> Tracheal Asp Ctx 8/9 >> Few GPC clusters  ANTIBIOTICS: Unasyn 8/9 >>  SIGNIFICANT EVENTS: 8/09 Admit, respiratory leading to cardiac arrest 8/10 Insulin  gtt for DKA, pressors 8/11 Cardiology consulted 8/12 off pressors  LINES/TUBES: ETT 8/09 >> 8/11 Lt IJ CVL 8/09 >>  DISCUSSION: 55 yo female presented to ER with n/v, epigastric pain and dyspnea after drinking binge of vodka.  She had recent admission for anion gap metabolic acidosis felt to be from starvation ketoacidosis.  She has hx of ETOH, depression, HTN, DM, paresis/paralysis of esophagus.  She developed respiratory, leading to cardiac arrest 8/09 after choking on pill with ROSC in 6 minutes.  She was found to have RLL pneumonia.  ASSESSMENT / PLAN:  PULMONARY A: Acute hypoxic respiratory failure 2nd to aspiration PNA. Hx of OSA P:   Monitor  oxygenation F/u CXR intermittently  CARDIOVASCULAR A:  Respiratory leading to cardiac arrest 8/09. NSTEMI. Hypotension >> resolved. P:  Continue ASA, lopressor, lipitor Continue heparin gtt F/u with cardiology  RENAL A:   Anion gap metabolic acidosis 2nd to DKA, starvation ketoacidosis, and lactic acidosis >> resolved. AKI >> improved. Hypokalemia. P:   Continue IV fluids F/u and replace electrolytes as needed  Monitor renal fx, urine outpt  GASTROINTESTINAL A:   Dysphagia with hx of esophageal paresis/paralysis. Severe protein calorie malnutrition. P:   Speech to assess swallowing  HEMATOLOGIC A:   Anemia/thrombocytopenia in setting of ETOH. P:  F/u CBC  INFECTIOUS A:   Aspiration Pneumonia. P:   Day 4 of unasyn  ENDOCRINE A:   DKA - Resolved. P:   SSI with Lantus  NEUROLOGIC A:   Acute metabolic encephalopathy. Hx of ETOH, Depression, Fibromyalgia, Decreased hearing. P:   Thiamine, folic acid, MVI Prn ativan for CIWA > 8  Change to SDU status 8/12 >> To [facility] 8/12 and PCCM off.  Chesley Mires, MD [facility] Pulmonary/Critical Care 12/20/2015, 11:29 AM Pager:  [phone] After 3pm call: [phone]

## 2015-12-20 NOTE — Progress Notes (Signed)
[place] Progress Note Patient Name: [name] DOB: March 15, 1961 MRN: [id number]   Date of Service  12/20/2015  HPI/Events of Note  Allergies - Patient c/o runny nose and takes Allegra at home. Currently NPO.  eICU Interventions  Will order: 1. Benadryl 12.5 mg IV Q 6 hours PRN allergies.     Intervention Category Intermediate Interventions: Other:  Lysle Dingwall 12/20/2015, 5:08 PM

## 2015-12-21 DIAGNOSIS — R1319 Other dysphagia: Secondary | ICD-10-CM

## 2015-12-21 DIAGNOSIS — J690 Pneumonitis due to inhalation of food and vomit: Secondary | ICD-10-CM

## 2015-12-21 DIAGNOSIS — E876 Hypokalemia: Secondary | ICD-10-CM

## 2015-12-21 LAB — GLUCOSE, CAPILLARY
GLUCOSE-CAPILLARY: 77 mg/dL (ref 65–99)
GLUCOSE-CAPILLARY: 79 mg/dL (ref 65–99)
Glucose-Capillary: 99 mg/dL (ref 65–99)

## 2015-12-21 LAB — CBC
HCT: 25.7 % — ABNORMAL LOW (ref 36.0–46.0)
HEMOGLOBIN: 8.4 g/dL — AB (ref 12.0–15.0)
MCH: 30.9 pg (ref 26.0–34.0)
MCHC: 32.7 g/dL (ref 30.0–36.0)
MCV: 94.5 fL (ref 78.0–100.0)
Platelets: 70 10*3/uL — ABNORMAL LOW (ref 150–400)
RBC: 2.72 MIL/uL — AB (ref 3.87–5.11)
RDW: 16.7 % — ABNORMAL HIGH (ref 11.5–15.5)
WBC: 4.1 10*3/uL (ref 4.0–10.5)

## 2015-12-21 LAB — BASIC METABOLIC PANEL
ANION GAP: 7 (ref 5–15)
BUN: 8 mg/dL (ref 6–20)
CO2: 27 mmol/L (ref 22–32)
Calcium: 7.7 mg/dL — ABNORMAL LOW (ref 8.9–10.3)
Chloride: 104 mmol/L (ref 101–111)
Creatinine, Ser: 0.75 mg/dL (ref 0.44–1.00)
GFR calc Af Amer: >60 mL/min (ref >60)
GFR calc non Af Amer: >60 mL/min (ref >60)
GLUCOSE: 94 mg/dL (ref 65–99)
POTASSIUM: 3.3 mmol/L — AB (ref 3.5–5.1)
Sodium: 138 mmol/L (ref 135–145)

## 2015-12-21 LAB — HEPARIN LEVEL (UNFRACTIONATED)
HEPARIN UNFRACTIONATED: 0.16 [IU]/mL — AB (ref 0.30–0.70)
Heparin Unfractionated: 0.13 IU/mL — ABNORMAL LOW (ref 0.30–0.70)

## 2015-12-21 LAB — HEPATIC FUNCTION PANEL
ALT: 28 U/L (ref 14–54)
AST: 56 U/L — AB (ref 15–41)
Albumin: 2.6 g/dL — ABNORMAL LOW (ref 3.5–5.0)
Alkaline Phosphatase: 77 U/L (ref 38–126)
BILIRUBIN DIRECT: 0.1 mg/dL (ref 0.1–0.5)
Indirect Bilirubin: 0.4 mg/dL (ref 0.3–0.9)
TOTAL PROTEIN: 5.5 g/dL — AB (ref 6.5–8.1)
Total Bilirubin: 0.5 mg/dL (ref 0.3–1.2)

## 2015-12-21 MED ORDER — HYDRALAZINE HCL 20 MG/ML IJ SOLN: 10.0000 mg | Freq: Four times a day (QID) | INTRAMUSCULAR | Status: DC | PRN

## 2015-12-21 MED ORDER — POTASSIUM CHLORIDE 10 MEQ/100ML IV SOLN
10.0000 meq | INTRAVENOUS | Status: AC
  Administered 2015-12-21 (×4): 10 meq via INTRAVENOUS
  Filled 2015-12-21 (×4): qty 100

## 2015-12-21 MED ORDER — POTASSIUM CHLORIDE CRYS ER 20 MEQ PO TBCR: 40.0000 meq | EXTENDED_RELEASE_TABLET | Freq: Once | ORAL | Status: DC

## 2015-12-21 MED ORDER — METOPROLOL TARTRATE 5 MG/5ML IV SOLN
2.5000 mg | INTRAVENOUS | Status: DC
  Administered 2015-12-21 – 2015-12-22 (×6): 2.5 mg via INTRAVENOUS
  Filled 2015-12-21 (×6): qty 5

## 2015-12-21 NOTE — Evaluation (Signed)
Clinical/Bedside Swallow Evaluation Patient Details  Name: [name] MRN: [id number] Date of Birth: February 15, 1961  Today's Date: 12/21/2015 Time: SLP Start Time (ACUTE ONLY): [id number] SLP Stop Time (ACUTE ONLY): 1446 SLP Time Calculation (min) (ACUTE ONLY): 18 min  Past Medical History:  Past Medical History:  Diagnosis Date  . Allergy    SEASONAL  . Anemia   . Anxiety   . Arthritis   . Cardiomyopathy   . Deaf   . Depression   . Fibromyalgia   . Gastritis   . GERD (gastroesophageal reflux disease)   . Hearing impairment   . Hypertension    [name], take htn medication to regulate heart beat.  . Migraine   . Osteoporosis   . Pulmonary embolism ([place])    occured post c-section of her daughter  . Sleep apnea   . Vitamin D deficiency    Past Surgical History:  Past Surgical History:  Procedure Laterality Date  . APPENDECTOMY    . CARDIAC SURGERY  January 2007   mitral valve repair  . CARDIAC SURGERY  1973   atrial septal defect  . CARDIAC SURGERY    . COCHLEAR IMPLANT    . INNER EAR SURGERY     TUBES  . LAPAROSCOPIC CHOLECYSTECTOMY  2012  . TONSILLECTOMY AND ADENOIDECTOMY    . TUBAL LIGATION     HPI:  55 yo female presented to ER with n/v, epigastric pain and dyspnea after drinking binge of vodka. She had recent admission for anion gap metabolic acidosis felt to be from starvation ketoacidosis. She has hx of ETOH, depression, HTN, DM, paresis/paralysis of esophagus. (Esophagram 03/11/14: "Diffuse fixed esophageal narrowing noted throughout the mid and distal esophagus. There is associated proximal thoracic esophagus diverticulum. These findings suggest a long-standing process resulting in diffuse esophageal scarring. Chronic reflux esophagitis and scleroderma could present in this fashion. A process such as Barrett's esophagus or esophageal malignancy cannot be excluded. The esophageal narrowing resulted in significant delay in passage of a standardized barium tablet.") Cardiac  arrest 8/9; intubated 8/9-11.   Pt describes chronic hx of esophageal deficits, having to eat soft foods and use liquid wash.  She describes frequent accumulation of POs in esophagus until there is spillover into her airway, causing coughing.     Assessment / Plan / Recommendation Clinical Impression  Pt presents with a primary and chronic esophageal dysphagia.  There are no acute deficits with oropharyngeal function and no observed s/s of aspiration.  Pt describes necessity of using caution when she eats, backflow of materials, frequent belching, poor transitioning of solids into stomach (she describes "paralysis" of her esophageal muscles.)  Recommend initiating a full liquid diet; SLP will follow briefly for education and discussion of further strategies, compensatory techniques.     Aspiration Risk  Mild aspiration risk    Diet Recommendation   full liquid diet  Medication Administration: Crushed with puree    Other  Recommendations Oral Care Recommendations: Oral care BID   Follow up Recommendations  None    Frequency and Duration min 1 x/week  1 week       Prognosis Prognosis for Safe Diet Advancement: Guarded      Swallow Study   General Date of Onset: 12/17/15 HPI: 55 yo female presented to ER with n/v, epigastric pain and dyspnea after drinking binge of vodka. She had recent admission for anion gap metabolic acidosis felt to be from starvation ketoacidosis. She has hx of ETOH, depression, HTN, DM, paresis/paralysis of esophagus. (  Esophagram 03/11/14: "Diffuse fixed esophageal narrowing noted throughout the mid and distal esophagus. There is associated proximal thoracic esophagus diverticulum. These findings suggest a long-standing process resulting in diffuse esophageal scarring. Chronic reflux esophagitis and scleroderma could present in this fashion. A process such as Barrett's esophagus or esophageal malignancy cannot be excluded. The esophageal narrowing resulted in  significant delay in passage of a standardized barium tablet.") Cardiac arrest 8/9; intubated 8/9-11.   Pt describes chronic hx of esophageal deficits, having to eat soft foods and use liquid wash.  She describes frequent accumulation of POs in esophagus until there is spillover into her airway, causing coughing.   Type of Study: Bedside Swallow Evaluation Previous Swallow Assessment: no Diet Prior to this Study: NPO Temperature Spikes Noted: No Respiratory Status: Room air History of Recent Intubation: Yes Length of Intubations (days): 2 days Date extubated: 12/19/15 Behavior/Cognition: Alert;Cooperative;Pleasant mood Oral Cavity Assessment: Within Functional Limits Oral Care Completed by SLP: No Oral Cavity - Dentition: Missing dentition Vision: Functional for self-feeding Self-Feeding Abilities: Able to feed self Patient Positioning: Upright in bed Baseline Vocal Quality: Normal Volitional Cough: Strong Volitional Swallow: Able to elicit    Oral/Motor/Sensory Function Overall Oral Motor/Sensory Function: Within functional limits   Ice Chips Ice chips: Within functional limits   Thin Liquid Thin Liquid: Within functional limits Presentation: Cup;Straw;Self Fed    Nectar Thick Nectar Thick Liquid: Not tested   Honey Thick Honey Thick Liquid: Not tested   Puree Puree: Impaired Presentation: Spoon Pharyngeal Phase Impairments: Multiple swallows   Solid   GO   Solid: Not tested       Tarina Volk L. Tivis Ringer, [place] CCC/SLP Pager [phone]  Juan Quam Laurice 12/21/2015,2:53 PM

## 2015-12-21 NOTE — Progress Notes (Signed)
Subjective:  Complains of chest wall soreness from CPR  Objective:   Vital Signs : Vitals:   12/21/15 0000 12/21/15 0400 12/21/15 0700 12/21/15 0800  BP:   (!) 138/91 (!) 131/93  Pulse:   93 92  Resp:      Temp: 98.6 F (37 C) 98.5 F (36.9 C)  98.5 F (36.9 C)  TempSrc: Oral Oral  Oral  SpO2:      Weight:  157 lb 13.6 oz (71.6 kg)    Height:        Intake/Output from previous day:  Intake/Output Summary (Last 24 hours) at 12/21/15 0852 Last data filed at 12/21/15 0823  Gross per 24 hour  Intake          1504.64 ml  Output             1150 ml  Net           354.64 ml    I/O since admission: +[phone]  Wt Readings from Last 3 Encounters:  12/21/15 157 lb 13.6 oz (71.6 kg)  12/09/15 135 lb (61.2 kg)  12/06/15 137 lb (62.1 kg)    Medications: . ampicillin-sulbactam (UNASYN) IV  3 g Intravenous Q6H  . antiseptic oral rinse  7 mL Mouth Rinse BID  . aspirin  81 mg Per Tube Daily  . atorvastatin  40 mg Oral q1800  . Chlorhexidine Gluconate Cloth  6 each Topical Q0600  . famotidine (PEPCID) IV  20 mg Intravenous Q12H  . folic acid  1 mg Intravenous Daily  . insulin aspart  1 Units Subcutaneous Q4H  . insulin glargine  10 Units Subcutaneous Q24H  . metoprolol  2.5 mg Intravenous Q6H  . multivitamin  15 mL Oral Daily  . mupirocin ointment  1 application Nasal BID  . potassium chloride  10 mEq Intravenous Q1 Hr x 4  . sodium chloride flush  3 mL Intravenous Q12H  . thiamine injection  100 mg Intravenous Daily    . sodium chloride 50 mL/hr at 12/21/15 0518  . dextrose 75 mL/hr at 12/21/15 0640  . heparin 1,000 Units/hr (12/20/15 1800)    Physical Exam:   General appearance: c/o chest wall pain Neck: no adenopathy, no JVD, supple, symmetrical, trachea midline and thyroid not enlarged, symmetric, no tenderness/mass/nodules Lungs: no wheezing Chest wall: tenderness to palpation.  Heart: RR at 96; 1/6 sem; no s3 Abdomen: soft, non-tender; bowel sounds normal; no  masses,  no organomegaly Extremities: R arm in hard cast Neurologic: Grossly normal   Rate: 96  Rhythm: NSR  12/20/15 ECG (independently read by me): NSR at 96; LBBB has resolved; NST changes; QTc 424 msec.  12/17/15 ECG (independently read by me): ST vs A Flutter at 137  Lab Results:   Recent Labs  12/18/15 1545  12/19/15 0450  12/19/15 1610 12/20/15 0455 12/21/15 0400  NA 137  < > 141  < > 144 145 138  K 2.7*  < > 2.7*  < > 3.1* 3.3* 3.3*  CL 103  < > 105  < > 108 111 104  CO2 21*  < > 28  < > '27 27 27  ' GLUCOSE 284*  < > 110*  < > 99 90 94  BUN 21*  < > 19  < > '18 12 8  ' CREATININE 1.61*  < > 1.03*  < > 1.05* 0.93 0.75  CALCIUM 6.7*  < > 7.1*  < > 7.2* 7.2* 7.7*  MG 1.4*  --  2.3  --   --  1.7  --   PHOS <0.1*  --  1.1*  --  2.6 2.5  --   < > = values in this interval not displayed.  Hepatic Function Latest Ref Rng & Units 12/21/2015 12/20/2015 12/17/2015  Total Protein 6.5 - 8.1 g/dL 5.5(L) 5.6(L) 8.1  Albumin 3.5 - 5.0 g/dL 2.6(L) 2.8(L) 4.8  AST 15 - 41 U/L 56(H) 93(H) 77(H)  ALT 14 - 54 U/L 28 29 34  Alk Phosphatase 38 - 126 U/L 77 68 130(H)  Total Bilirubin 0.3 - 1.2 mg/dL 0.5 0.6 0.4  Bilirubin, Direct 0.1 - 0.5 mg/dL 0.1 - -     Recent Labs  12/20/15 0455 12/20/15 1145 12/21/15 0400  WBC 5.3 5.6 4.1  HGB 8.0* 8.4* 8.4*  HCT 24.6* 25.4* 25.7*  MCV 94.6 94.8 94.5  PLT 75* 74* 70*     Recent Labs  12/19/15 0737 12/19/15 1438 12/19/15 2038  TROPONINI 18.59* 9.89* 7.63*    Lab Results  Component Value Date   TSH 6.205 (H) 10/24/2015    Recent Labs  12/18/15 0910  HGBA1C 4.6*     Recent Labs  12/20/15 0455 12/21/15 0400  PROT 5.6* 5.5*  ALBUMIN 2.8* 2.6*  AST 93* 56*  ALT 29 28  ALKPHOS 68 77  BILITOT 0.6 0.5  BILIDIR  --  0.1  IBILI  --  0.4    Recent Labs  12/19/15 1015  INR 1.25   BNP (last 3 results) No results for input(s): BNP in the last 8760 hours.  ProBNP (last 3 results) No results for input(s): PROBNP in the last  8760 hours.   Lipid Panel     Component Value Date/Time   CHOL 149 12/20/2015 0455   TRIG 167 (H) 12/20/2015 0455   HDL 35 (L) 12/20/2015 0455   CHOLHDL 4.3 12/20/2015 0455   VLDL 33 12/20/2015 0455   LDLCALC 81 12/20/2015 0455      Imaging:  Dg Chest Port 1 View  Result Date: 12/20/2015 CLINICAL DATA:  Patient with history of pneumonia and cardiomyopathy. EXAM: PORTABLE CHEST 1 VIEW COMPARISON:  Chest radiograph 12/19/2015 FINDINGS: Interval extubation and removal of enteric tube. Left IJ central venous catheter tip projects over the superior vena cava. Monitoring leads overlie the patient. Stable cardiomegaly. Interval worsening of airspace consolidation within the right mid and lower lung. Probable tiny bilateral pleural effusions. No pneumothorax. IMPRESSION: Interval increase in right mid and lower lung consolidation. Probable small bilateral pleural effusions. Interval extubation. Electronically Signed   By: Lovey Newcomer M.D.   On: 12/20/2015 07:17    Echocardiogram: 10/2015 Study Conclusions  - Left ventricle: The cavity size was normal. Wall thickness was normal. Systolic function was normal. The estimated ejection fraction was in the range of 55% to 60%. Wall motion was normal; there were no regional wall motion abnormalities. Features are consistent with a pseudonormal left ventricular filling pattern, with concomitant abnormal relaxation and increased filling pressure (grade 2 diastolic dysfunction). - Mitral valve: Calcified annulus. - Left atrium: The atrium was mildly dilated. - Right ventricle: The cavity size was mildly dilated. Wall thickness was normal. Systolic function was mildly reduced. - Right atrium: The atrium was moderately dilated. - Tricuspid valve: There was moderate regurgitation. - Pulmonary arteries: Systolic pressure was mildly increased. PA peak pressure: 43 mm Hg (S).   Assessment/Plan:   Principal Problem:   Metabolic  acidosis Active Problems:   Alcohol dependence ([place])   Major  depression, chronic ([facility])   Acute kidney injury ([place])   Essential hypertension   Lactic acidosis   Leucocytosis   Dehydration   Starvation ketoacidosis   Alcohol abuse   Pressure ulcer   Protein-calorie malnutrition, severe   NSTEMI (non-ST elevated myocardial infarction) ([place])   Typical atrial flutter (H[facility])   Pneumonia     1. NSTEMI - patient presented with nausea and vomiting in the setting of consuming alcohol in excess. Labs on admission consistent with metabolic acidosis and lactic acidosis. Had a cardiac arrest on admission thought to be secondary to aspiration PNA as this occurred after taking a PO medication. Required CPR for 6 minutes and 1 dose of epinephrine with ROSC.  - cyclic troponin values obtained and have been 0.03, 0.10, 7.89, 13.88, and 21.83. EKG on admission showed sinus tachycardia, HR 137, with a new LBBB.  Troponin today decreased to 7.63. - last echocardiogram in 10/2015 showed an EF of 55-60% with no wall motion abnormalities.  - history is somewhat limited by her current intubation, but when asked about recent chest discomfort or dyspnea with exertion, she denies this. Says she has esophageal spasms but no acute changes in her pain.  - with her significantly elevated troponin, recent cardiac arrest, and new LBBB  I feel like a cardiac catheterization is  warranted at some point once her current clinical condition improves, however her chronic alcohol use and medication noncompliance is certainly a concern. Would not be a good DAPT candidate, possibly a candidate for LEADERS FREE study? - obtain repeat echocardiogram and EKG.  She is refusing all oral meds;  Will change metoprolol  2.5 mg iv to q 4 hrs with heart rate at 96 now.  With platelet count decreased to 70 K; dc heparin.   2. ASD/ Mitral Valve Repair - s/p ASD repair in 1973, mitral valve repair in 2007 - repeat echocardiogram pending.    3. Aspiration PNA - - currently on Unasyn - per admitting team  4. Metabolic Acidosis/ Lactic Acidosis - lactic acid 12.19 on admission, improved to 1.9 today. - per admitting team  5. Chronic Normocytic Anemia - Hgb variable from 9.1 - 15.0 this year. 13.7 on admission, 10.1 on 8/10.--> 8.4/25.7 today. - continue to monitor. No evidence of active bleeding on exam.   6. Alcohol Abuse/ Medication Noncompliance - has reported consuming nothing but Vodka in the days leading up to her hospital admission. Not taking any medications. - concern for medication compliance going forward.   7. Hypokalema - K 3.3; supplement to 4.0 range; MG 1.7.  Can't swallow; to get 4 runs of 10 meq today  8. Esophageal Dysmotility - Pt states that she cannot swallow pills. She can drink liquids.  Consider swallowing study   9. Anemia/Thrombocytopenia - H/H today 8.4/25.7 - Platelets 75K yesterday --> 70 K today; no active bleeding ? ETOH toxicity.  Will dc heparin.  10. Abnormal LFTs - contributed by ETOH, hypotension  Troy Sine, MD, [facility] 12/21/2015, 8:52 AM

## 2015-12-21 NOTE — Progress Notes (Addendum)
PHARMACY - HEPARIN (brief note)  Patient on IV heparin for chest pain/ACS.  IV heparin currently infusing @ 1000 units/hr  Heparin level @ 0400 = 0.16  (goal 0.3-0.7).  Previous heparin level had been therapeutic at current rate.  Spoke with RN.  IV heparin currently infusing through central line.  IV heparin was briefly held and lab was drawn through the central line, which may account for the subtherapeutic result.  Plan: Continue heparin @ 1000 units/hr Repeat heparin level @ 7am before making dose change to confirm level not due to error.  Level to be drawn through peripheral site.  Leone Haven, PharmD

## 2015-12-21 NOTE — Progress Notes (Signed)
ANTICOAGULATION CONSULT NOTE - Learned for heparin Indication: chest pain/ACS  Allergies  Allergen Reactions  . Depakote [Divalproex Sodium] Anaphylaxis  . Diazepam Other (See Comments)    Died on operating table  . Valium Anaphylaxis  . Tizanidine Hcl Other (See Comments)    "passed out"    Patient Measurements: Height: 5\' 3"  (160 cm) Weight: 157 lb 13.6 oz (71.6 kg) IBW/kg (Calculated) : 52.4 Heparin Dosing Weight: 60kg  Vital Signs: Temp: 98.5 F (36.9 C) (08/13 0800) Temp Source: Oral (08/13 0800) BP: 131/93 (08/13 0800) Pulse Rate: 92 (08/13 0800)  Labs:  Recent Labs  12/19/15 0737 12/19/15 1015 12/19/15 1438 12/19/15 1610 12/19/15 2038  12/20/15 0455  12/20/15 1145 12/20/15 1438 12/21/15 0400 12/21/15 0656  HGB  --   --   --   --   --   < > 8.0*  --  8.4*  --  8.4*  --   HCT  --   --   --   --   --   --  24.6*  --  25.4*  --  25.7*  --   PLT  --   --   --   --   --   --  75*  --  74*  --  70*  --   APTT  --  27  --   --   --   --   --   --   --   --   --   --   LABPROT  --  15.7*  --   --   --   --   --   --   --   --   --   --   INR  --  1.25  --   --   --   --   --   --   --   --   --   --   HEPARINUNFRC  --   --   --  0.16*  --   < >  --   < > 0.12* 0.30 0.16* 0.13*  CREATININE 0.94  --   --  1.05*  --   --  0.93  --   --   --  0.75  --   TROPONINI 18.59*  --  9.89*  --  7.63*  --   --   --   --   --   --   --   < > = values in this interval not displayed.  Estimated Creatinine Clearance: 75.4 mL/min (by C-G formula based on SCr of 0.8 mg/dL).   Assessment: 89 yoF w/ hx of heavy alchohol use recently d/c from hospital s/p cardiac arrest 8/9 hs.  Concerns for possible ACS with significantly increased troponin.  Adding heparin per pharmacy, statin and ASA. No beta blocker for hypotension and vasopressor use  Baseline INR = 1.25, aPTT = 27sec  Today, 12/21/2015   Heparin level = 0.16 this am - RN communicates this was  drawn from central line with heparin infusing via central access.  Pharmacist asked for level to be re-drawn peripherally, repeat level = 0.13. Current heparin rate 1000 units/hr  CBC: reveals decreased Hgb and pltc down to 70  Suspect decrease (in part) is related to volume and being dehydrated at admission.  Previous pltc as low as 120's in past  Do no suspect HIT due to timing - expect to see decline after 5-7 days of heparin (decline  was < 24h from start of heparin)  Renal : SCr WNL  Patient reports to RN today (8/12)  that she has a slow nosebleed since 8/11  Dr. Halford Chessman aware of Hgb/platelets and minor bleeding - continue to monitor  Discuss with cardiology (Dr. Claiborne Billings), will stop heparin gtt in light of platelets and received 48h of heparin gtt  Patient is refusing oral meds, including aspirin and statin.  To consider ASA supp as pltc allows  Goal of Therapy:  Heparin level 0.3-0.7 units/ml Monitor platelets by anticoagulation protocol: Yes   Plan:   D/c heparin and heparin labs  Will f/u [facility] re: VTE prophylaxis  Doreene Eland, PharmD, BCPS.   Pager: [phone] 12/21/2015 9:24 AM

## 2015-12-21 NOTE — Progress Notes (Signed)
PROGRESS NOTE                                                                                                                                                                                                             Patient Demographics:    [name], is a 55 y.o. female, DOB - 27-Jul-1960, [id number]  Admit date - 12/17/2015   Admitting Physician No admitting provider for patient encounter.  Outpatient Primary MD for the patient is Anthoney Harada, MD  LOS - 3  Outpatient Specialists: none  Chief Complaint  Patient presents with  . Nausea  . Emesis  . Shortness of Breath       Brief Narrative   55 year old female with history of alcohol abuse, major depression, hearing impairment presented to the ED with nausea and vomiting with epigastric pain, and gap metabolic acidosis/lactic acidosis. She had leukocytosis of 20k and was admitted to telemetry. During the evening she went into acute respiratory failure leading to cardiac arrest after choking on a pill. CPR was initiated with ROSC in 6 minutes. She was transferred to ICU intubated. She was found to have right lower lobe pneumonia. Also found to have NSTEMI. Patient placed on pressors and IV heparin. She was successfully extubated on 8/11 and  transferred to hospitalist service.    Subjective:   Feels better today. NPO pending SLP eval   Assessment  & Plan :    Principal Problem: Acute hypoxic respiratory failure Likely secondary to aspiration pneumonia. Required intubation and ICU monitoring. Continue Unasyn. Now maintaining sats on room air. Cultures negative.  Active Problems: Cardiac arrest with NSTEMI Likely contributed acute respiratory failure. On IV heparin which has been discontinued today due to thrombocytopenia. Continue aspirin, metoprolol and Lipitor. 2-D echo shows EF of 35-60% with no wall motion abnormality.  Will need cardiac cath  prior to discharge. Cardiology  Following.  Aspiration pneumonia Continue empiric Unasyn.    Alcohol dependence ([place]) Counseled strongly on cessation. She wishes to quit. Social work consulted.  Anion gap metabolic acidosis/lactic acidosis Suspected due to heavy alcohol use with dehydration and vomiting. Now resolved with hydration.  Dysphagia with history of esophageal paresis Swallowing evaluation pending. Continue PPI   Anemia/thrombocytopenia Suspected in the setting of acute illness/alcohol abuse Discontinue IV heparin. Monitor closely.    Major depression, chronic (HCC) Stable. Trazodone  on hold due to prolonged QTC.     Acute kidney injury ([place]) Secondary to dehydration. Required aggressive hydration and now resolved.     Essential hypertension Continue BB. Add  Prn hydralazine   Hypokalemia replenish   Hx of MVR     Code Status :Full code  Family Communication  : none at bedside  Disposition Plan  :  Home once improved, possibly in 3-4 days (upon decision on cardiac cath) Transfer to tele  Barriers For Discharge :  Improving symptoms  Consults  :   PCCM cardiology  Procedures  :  ETT 8/09 >> 8/11 Lt IJ CVL 8/09 >> 2d echo  DVT Prophylaxis  :SCDs   Lab Results  Component Value Date   PLT 70 (L) 12/21/2015    Antibiotics  :    Anti-infectives    Start     Dose/Rate Route Frequency Ordered Stop   12/18/15 0000  Ampicillin-Sulbactam (UNASYN) 3 g in sodium chloride 0.9 % 100 mL IVPB     3 g 100 mL/hr over 60 Minutes Intravenous Every 6 hours 12/17/15 2352          Objective:   Vitals:   12/21/15 0800 12/21/15 0900 12/21/15 1000 12/21/15 1200  BP: (!) 131/93 (!) 145/100 (!) 144/99   Pulse: 92 89 89   Resp: 20 19 (!) 25   Temp: 98.5 F (36.9 C)   98.5 F (36.9 C)  TempSrc: Oral   Oral  SpO2: 99% 99% 97%   Weight:      Height:        Wt Readings from Last 3 Encounters:  12/21/15 71.6 kg (157 lb 13.6 oz)  12/09/15 61.2 kg (135  lb)  12/06/15 62.1 kg (137 lb)     Intake/Output Summary (Last 24 hours) at 12/21/15 1244 Last data filed at 12/21/15 0823  Gross per 24 hour  Intake          1126.64 ml  Output             1050 ml  Net            76.64 ml     Physical Exam  Gen: not in distress HEENT: no pallor, moist mucosa, supple neck,  left IJ  Chest: clear b/l, no added sounds CVS: N S1&S2, no murmurs, rubs or gallop GI: soft, NT, ND, BS+ Musculoskeletal: warm, no edema CNS: AAOX3, non focal    Data Review:    CBC  Recent Labs Lab 12/17/15 1544 12/18/15 0443 12/20/15 0455 12/20/15 1145 12/21/15 0400  WBC 20.1* 5.6 5.3 5.6 4.1  HGB 13.7 10.1* 8.0* 8.4* 8.4*  HCT 42.8 30.4* 24.6* 25.4* 25.7*  PLT 492* 217 75* 74* 70*  MCV 98.2 96.2 94.6 94.8 94.5  MCH 31.4 32.0 30.8 31.3 30.9  MCHC 32.0 33.2 32.5 33.1 32.7  RDW 15.4 15.5 17.6* 17.5* 16.7*    Chemistries   Recent Labs Lab 12/17/15 1544  12/17/15 2235 12/18/15 0443  12/18/15 1545  12/19/15 0450 12/19/15 0737 12/19/15 1610 12/20/15 0455 12/21/15 0400  NA 140  < > 141 135  < > 137  < > 141 141 144 145 138  K 3.8  < > 6.0* 4.1  < > 2.7*  < > 2.7* 3.3* 3.1* 3.3* 3.3*  CL 98*  < > 113* 106  < > 103  < > 105 105 108 111 104  CO2 7*  < > <7* 11*  < > 21*  < >  28 28 27 27 27   GLUCOSE 76  < > 146* 347*  < > 284*  < > 110* 140* 99 90 94  BUN 17  < > 15 20  < > 21*  < > 19 16 18 12 8   CREATININE 1.23*  < > 1.04* 1.68*  < > 1.61*  < > 1.03* 0.94 1.05* 0.93 0.75  CALCIUM 8.1*  < > 6.4* 6.4*  < > 6.7*  < > 7.1* 7.1* 7.2* 7.2* 7.7*  MG  --   < > 2.0 1.8  --  1.4*  --  2.3  --   --  1.7  --   AST 77*  --   --   --   --   --   --   --   --   --  93* 56*  ALT 34  --   --   --   --   --   --   --   --   --  29 28  ALKPHOS 130*  --   --   --   --   --   --   --   --   --  68 77  BILITOT 0.4  --   --   --   --   --   --   --   --   --  0.6 0.5  < > = values in this interval not  displayed. ------------------------------------------------------------------------------------------------------------------  Recent Labs  12/20/15 0455  CHOL 149  HDL 35*  LDLCALC 81  TRIG 167*  CHOLHDL 4.3    Lab Results  Component Value Date   HGBA1C 4.6 (L) 12/18/2015   ------------------------------------------------------------------------------------------------------------------ No results for input(s): TSH, T4TOTAL, T3FREE, THYROIDAB in the last 72 hours.  Invalid input(s): FREET3 ------------------------------------------------------------------------------------------------------------------ No results for input(s): VITAMINB12, FOLATE, FERRITIN, TIBC, IRON, RETICCTPCT in the last 72 hours.  Coagulation profile  Recent Labs Lab 12/19/15 1015  INR 1.25    No results for input(s): DDIMER in the last 72 hours.  Cardiac Enzymes  Recent Labs Lab 12/19/15 0737 12/19/15 1438 12/19/15 2038  TROPONINI 18.59* 9.89* 7.63*   ------------------------------------------------------------------------------------------------------------------ No results found for: BNP  Inpatient Medications  Scheduled Meds: . ampicillin-sulbactam (UNASYN) IV  3 g Intravenous Q6H  . antiseptic oral rinse  7 mL Mouth Rinse BID  . aspirin  81 mg Per Tube Daily  . atorvastatin  40 mg Oral q1800  . Chlorhexidine Gluconate Cloth  6 each Topical Q0600  . famotidine (PEPCID) IV  20 mg Intravenous Q12H  . folic acid  1 mg Intravenous Daily  . metoprolol  2.5 mg Intravenous Q4H  . multivitamin  15 mL Oral Daily  . mupirocin ointment  1 application Nasal BID  . potassium chloride  10 mEq Intravenous Q1 Hr x 4  . sodium chloride flush  3 mL Intravenous Q12H  . thiamine injection  100 mg Intravenous Daily   Continuous Infusions: . sodium chloride 50 mL/hr at 12/21/15 0518   PRN Meds:.acetaminophen **OR** acetaminophen, albuterol, diphenhydrAMINE, lip balm, LORazepam, morphine injection,  nitroGLYCERIN, sodium chloride, sodium chloride flush  Micro Results Recent Results (from the past 240 hour(s))  Culture, blood (routine x 2)     Status: None (Preliminary result)   Collection Time: 12/17/15  6:49 PM  Result Value Ref Range Status   Specimen Description BLOOD LEFT HAND  Final   Special Requests IN PEDIATRIC BOTTLE [facility]  Final   Culture   Final  NO GROWTH 3 DAYS Performed at [facility]    Report Status PENDING  Incomplete  Culture, blood (routine x 2)     Status: None (Preliminary result)   Collection Time: 12/17/15  6:49 PM  Result Value Ref Range Status   Specimen Description BLOOD LEFT HAND  Final   Special Requests BOTTLES DRAWN AEROBIC ONLY 5CC  Final   Culture   Final    NO GROWTH 3 DAYS Performed at [facility]    Report Status PENDING  Incomplete  MRSA PCR Screening     Status: Abnormal   Collection Time: 12/17/15  9:42 PM  Result Value Ref Range Status   MRSA by PCR POSITIVE (A) NEGATIVE Final    Comment:        The GeneXpert MRSA Assay (FDA approved for NASAL specimens only), is one component of a comprehensive MRSA colonization surveillance program. It is not intended to diagnose MRSA infection nor to guide or monitor treatment for MRSA infections. RESULT CALLED TO, READ BACK BY AND VERIFIED WITH: M.BARHAM,RN AT 0045 12/18/15 BY W.SHEA   Culture, respiratory (NON-Expectorated)     Status: None   Collection Time: 12/17/15 11:32 PM  Result Value Ref Range Status   Specimen Description TRACHEAL ASPIRATE  Final   Special Requests Normal  Final   Gram Stain   Final    MODERATE WBC PRESENT, PREDOMINANTLY PMN FEW GRAM POSITIVE COCCI IN CLUSTERS    Culture   Final    Consistent with normal respiratory flora. Performed at [facility]    Report Status 12/20/2015 FINAL  Final  C difficile quick scan w PCR reflex     Status: None   Collection Time: 12/19/15  6:53 PM  Result Value Ref Range Status   C Diff antigen  NEGATIVE NEGATIVE Final   C Diff toxin NEGATIVE NEGATIVE Final   C Diff interpretation No C. difficile detected.  Final    Radiology Reports Ct Abdomen Pelvis Wo Contrast  Result Date: 12/06/2015 CLINICAL DATA:  55 year old female with abdominal pain EXAM: CT ABDOMEN AND PELVIS WITHOUT CONTRAST TECHNIQUE: Multidetector CT imaging of the abdomen and pelvis was performed following the standard protocol without IV contrast. COMPARISON:  Abdominal CT dated 10/06/2015 and ultrasound dated 10/21/2015 FINDINGS: Evaluation of this exam is limited in the absence of intravenous contrast. Evaluation is also limited due to respiratory motion artifact. There is cardiomegaly. Mechanical mitral valve. There is hypoattenuation of the cardiac blood pool suggestive of a degree of anemia. Clinical correlation is recommended. There is slight prominence of the pulmonary vasculature is and the visualized lung bases which may represent mild congestive changes. No intra-abdominal free air or free fluid. Cholecystectomy. Fatty liver. The pancreas, spleen, adrenal glands, kidneys, visualized ureters, and urinary bladder appear unremarkable. The uterus is anteverted and grossly unremarkable. There is sigmoid diverticulosis without active inflammatory changes. There is a small hiatal hernia. There is no evidence of bowel obstruction or active inflammation. There is mild aortoiliac atherosclerotic disease. The abdominal aorta and IVC are grossly unremarkable on this noncontrast study. No portal venous gas identified. There is no adenopathy. There is a small fat containing umbilical hernia. The abdominal wall soft tissues are otherwise unremarkable. There is degenerative changes of the spine. Scoliosis. Multilevel endplate irregularity and disc space narrowing. There is a chronic appearing 5 mm retropulsed fragment from the posterior aspect of the superior endplate of the L1 vertebrae. No acute fracture. IMPRESSION: No acute  intra-abdominal pelvic pathology. Diverticulosis. Fatty  liver. Electronically Signed   By: Anner Crete M.D.   On: 12/06/2015 06:44  Dg Chest 2 View  Result Date: 12/17/2015 CLINICAL DATA:  Shortness of breath, nausea and vomiting beginning this morning. EXAM: CHEST  2 VIEW COMPARISON:  PA and lateral chest 07/17/2015. FINDINGS: The lungs are clear. Heart size is normal. The patient is status post mitral valve repair. No pneumothorax or pleural effusion. Remote T12 compression fracture is seen. IMPRESSION: No acute disease. Electronically Signed   By: Inge Rise M.D.   On: 12/17/2015 15:23   Dg Forearm Right  Result Date: 12/04/2015 CLINICAL DATA:  Fall today with right distal forearm and wrist pain with swelling and bruising. EXAM: RIGHT FOREARM - 2 VIEW COMPARISON:  None. FINDINGS: Comminuted fracture the distal radial metaphysis extends to the articular surface apex anterior angulation noted at fracture site. No associated ulnar fracture is identified. IMPRESSION: Comminuted distal radius fracture with intra-articular extension. Electronically Signed   By: Misty Stanley M.D.   On: 12/04/2015 19:16  Dg Wrist Complete Right  Result Date: 12/04/2015 CLINICAL DATA:  Fall earlier today with wrist pain and swelling. EXAM: RIGHT WRIST - COMPLETE 3+ VIEW COMPARISON:  None. FINDINGS: Comminuted fracture of the distal radius identified with intra-articular extension. No associated fracture the distal ulna. Carpal alignment is anatomic. There are some degenerative changes at the first carpometacarpal joint and also at the first MCP joint. IMPRESSION: Comminuted distal radius fracture with intra-articular extension. Electronically Signed   By: Misty Stanley M.D.   On: 12/04/2015 19:17  Dg Abd 1 View  Result Date: 12/18/2015 CLINICAL DATA:  OG tube placement. EXAM: ABDOMEN - 1 VIEW COMPARISON:  12/06/2015 FINDINGS: An OG tube is identified with tip overlying the distal stomach. The bowel gas pattern  is unremarkable. Defibrillator pads overlying the lower chest noted. IMPRESSION: OG tube with tip overlying the distal stomach. Electronically Signed   By: Margarette Canada M.D.   On: 12/18/2015 01:15   Dg Chest Port 1 View  Result Date: 12/20/2015 CLINICAL DATA:  Patient with history of pneumonia and cardiomyopathy. EXAM: PORTABLE CHEST 1 VIEW COMPARISON:  Chest radiograph 12/19/2015 FINDINGS: Interval extubation and removal of enteric tube. Left IJ central venous catheter tip projects over the superior vena cava. Monitoring leads overlie the patient. Stable cardiomegaly. Interval worsening of airspace consolidation within the right mid and lower lung. Probable tiny bilateral pleural effusions. No pneumothorax. IMPRESSION: Interval increase in right mid and lower lung consolidation. Probable small bilateral pleural effusions. Interval extubation. Electronically Signed   By: Lovey Newcomer M.D.   On: 12/20/2015 07:17   Dg Chest Port 1 View  Result Date: 12/19/2015 CLINICAL DATA:  Pneumonia. EXAM: PORTABLE CHEST 1 VIEW COMPARISON:  12/18/2015. FINDINGS: Endotracheal tube, NG tube, left IJ line stable position. Prior cardiac valve repair. Stable cardiomegaly. Progressive bibasilar atelectasis and infiltrates. Small right pleural effusion. No pneumothorax . IMPRESSION: 1. Lines and tubes in stable position. 2. Progressive bibasilar atelectasis and infiltrates. Small right pleural effusion. 3. No pneumothorax. Electronically Signed   By: Marcello Moores  Register   On: 12/19/2015 07:04   Dg Chest Port 1 View  Result Date: 12/18/2015 CLINICAL DATA:  Pneumonia, followup, intubation, central line, history cardiomyopathy, pulmonary embolism, hypertension, GERD EXAM: PORTABLE CHEST 1 VIEW COMPARISON:  Portable exam 0943 hours compared 12/17/2015 FINDINGS: Tip of endotracheal tube projects 5.9 cm above carina. Nasogastric tube extends into stomach. LEFT jugular central venous catheter tip projects over SVC near cavoatrial  junction. External pacing leads project  over chest. Enlargement of cardiac silhouette post MVR. Mediastinal contours normal vascularity normal. Persistent RIGHT middle lobe consolidation consistent with pneumonia. Question developing LEFT lower lobe consolidation as well. Remaining lungs clear. No pleural effusion or pneumothorax. IMPRESSION: Persistent RIGHT middle lobe pneumonia with question developing infiltrate versus atelectasis in LEFT lower lobe. Line and tube positions as above. Electronically Signed   By: Lavonia Dana M.D.   On: 12/18/2015 11:13   Dg Chest Port 1 View  Result Date: 12/18/2015 CLINICAL DATA:  Central line placement.  Initial encounter. EXAM: PORTABLE CHEST 1 VIEW COMPARISON:  Chest radiograph performed earlier today at 10:32 p.m. FINDINGS: The patient's left IJ line is noted ending about the distal SVC. The patient's endotracheal tube is seen ending 2-3 cm above the carina. The right lower lobe airspace opacification is compatible with pneumonia, similar in appearance to the prior study. No pleural effusion or pneumothorax is seen. The cardiomediastinal silhouette is mildly enlarged. The patient is status post median sternotomy. A valve replacement is noted. External pacing pads are seen. No acute osseous abnormalities are identified. IMPRESSION: 1. Left IJ line noted ending about the distal SVC. 2. Endotracheal tube seen ending 2-3 cm above the carina. 3. Right lower lobe pneumonia, similar in appearance to the recent prior study. 4. Mild cardiomegaly. Electronically Signed   By: Garald Balding M.D.   On: 12/18/2015 00:17   Dg Chest Port 1 View  Result Date: 12/17/2015 CLINICAL DATA:  CPR and intubation. EXAM: PORTABLE CHEST 1 VIEW COMPARISON:  12/17/2015. FINDINGS: Endotracheal tube is now identified with tip 2.3 cm above the carina. New dense consolidation/opacity throughout the medial right mid and lower lung noted. There is no evidence of pleural effusion or pneumothorax.  Defibrillator pad overlying the lower chest noted. Cardiomegaly and cardiac valve replacement again identified. IMPRESSION: Endotracheal tube with tip 2.3 cm above the carina. New dense consolidations/opacity within the medial right mid and lower lung. Electronically Signed   By: Margarette Canada M.D.   On: 12/17/2015 23:00   Dg Abdomen Acute W/chest  Result Date: 12/06/2015 CLINICAL DATA:  55 year old female with abdominal pain EXAM: DG ABDOMEN ACUTE W/ 1V CHEST COMPARISON:  Chest radiograph dated 10/23/2015 FINDINGS: The lungs are clear. No pleural effusion or pneumothorax. Stable mild cardiomegaly. Median sternotomy wires and CABG vascular clips. Mechanical cardiac valve. There is no bowel dilatation or evidence of obstruction. No free air or radiopaque calculi. Right upper quadrant cholecystectomy clips. There is degenerative changes of the spine and scoliosis. No acute fracture. IMPRESSION: No acute cardiopulmonary process. No bowel obstruction. Electronically Signed   By: Anner Crete M.D.   On: 12/06/2015 03:31   Time Spent in minutes  25   Louellen Molder M.D on 12/21/2015 at 12:44 PM  Between 7am to 7pm - Pager - [phone]  After 7pm go to [email or web address] - password [facility]  [facility] -  Office  [phone]

## 2015-12-22 ENCOUNTER — Encounter (HOSPITAL_COMMUNITY): Payer: Self-pay | Admitting: Cardiology

## 2015-12-22 DIAGNOSIS — I469 Cardiac arrest, cause unspecified: Secondary | ICD-10-CM

## 2015-12-22 DIAGNOSIS — I10 Essential (primary) hypertension: Secondary | ICD-10-CM

## 2015-12-22 HISTORY — DX: Cardiac arrest, cause unspecified: I46.9

## 2015-12-22 LAB — CBC WITH DIFFERENTIAL/PLATELET
BASOS PCT: 1 %
Basophils Absolute: 0 10*3/uL (ref 0.0–0.1)
EOS ABS: 0.2 10*3/uL (ref 0.0–0.7)
EOS PCT: 6 %
HCT: 26.5 % — ABNORMAL LOW (ref 36.0–46.0)
HEMOGLOBIN: 8.6 g/dL — AB (ref 12.0–15.0)
LYMPHS ABS: 0.6 10*3/uL — AB (ref 0.7–4.0)
Lymphocytes Relative: 21 %
MCH: 31.2 pg (ref 26.0–34.0)
MCHC: 32.5 g/dL (ref 30.0–36.0)
MCV: 96 fL (ref 78.0–100.0)
MONOS PCT: 21 %
Monocytes Absolute: 0.6 10*3/uL (ref 0.1–1.0)
NEUTROS PCT: 51 %
Neutro Abs: 1.4 10*3/uL — ABNORMAL LOW (ref 1.7–7.7)
PLATELETS: 82 10*3/uL — AB (ref 150–400)
RBC: 2.76 MIL/uL — ABNORMAL LOW (ref 3.87–5.11)
RDW: 16.7 % — AB (ref 11.5–15.5)
WBC: 2.8 10*3/uL — ABNORMAL LOW (ref 4.0–10.5)

## 2015-12-22 LAB — BASIC METABOLIC PANEL
Anion gap: 7 (ref 5–15)
BUN: 8 mg/dL (ref 6–20)
CALCIUM: 8.4 mg/dL — AB (ref 8.9–10.3)
CHLORIDE: 105 mmol/L (ref 101–111)
CO2: 27 mmol/L (ref 22–32)
CREATININE: 0.9 mg/dL (ref 0.44–1.00)
GFR calc non Af Amer: >60 mL/min (ref >60)
Glucose, Bld: 84 mg/dL (ref 65–99)
Potassium: 3.7 mmol/L (ref 3.5–5.1)
SODIUM: 139 mmol/L (ref 135–145)

## 2015-12-22 LAB — CBC
HCT: 24.5 % — ABNORMAL LOW (ref 36.0–46.0)
Hemoglobin: 8 g/dL — ABNORMAL LOW (ref 12.0–15.0)
MCH: 31 pg (ref 26.0–34.0)
MCHC: 32.7 g/dL (ref 30.0–36.0)
MCV: 95 fL (ref 78.0–100.0)
PLATELETS: 67 10*3/uL — AB (ref 150–400)
RBC: 2.58 MIL/uL — AB (ref 3.87–5.11)
RDW: 16.5 % — ABNORMAL HIGH (ref 11.5–15.5)
WBC: 3 10*3/uL — ABNORMAL LOW (ref 4.0–10.5)

## 2015-12-22 LAB — CULTURE, BLOOD (ROUTINE X 2)
CULTURE: NO GROWTH
Culture: NO GROWTH

## 2015-12-22 MED ORDER — LIDOCAINE 5 % EX PTCH
1.0000 | MEDICATED_PATCH | CUTANEOUS | Status: DC
  Administered 2015-12-22 – 2015-12-23 (×2): 1 via TRANSDERMAL
  Filled 2015-12-22 (×5): qty 1

## 2015-12-22 MED ORDER — HYDROCODONE-ACETAMINOPHEN 5-325 MG PO TABS
2.0000 | ORAL_TABLET | ORAL | Status: DC | PRN
  Administered 2015-12-22 – 2015-12-26 (×12): 2 via ORAL
  Filled 2015-12-22 (×12): qty 2

## 2015-12-22 MED ORDER — METOPROLOL TARTRATE 5 MG/5ML IV SOLN: 5.0000 mg | Freq: Four times a day (QID) | INTRAVENOUS | Status: DC

## 2015-12-22 MED ORDER — METOPROLOL TARTRATE 25 MG PO TABS: 25.0000 mg | ORAL_TABLET | Freq: Two times a day (BID) | ORAL | Status: DC

## 2015-12-22 MED ORDER — METOPROLOL TARTRATE 50 MG PO TABS
50.0000 mg | ORAL_TABLET | Freq: Two times a day (BID) | ORAL | Status: DC
  Administered 2015-12-22 – 2015-12-23 (×2): 50 mg via ORAL
  Filled 2015-12-22 (×2): qty 1

## 2015-12-22 MED ORDER — GLUCERNA SHAKE PO LIQD
237.0000 mL | Freq: Three times a day (TID) | ORAL | Status: DC
  Administered 2015-12-22 – 2015-12-26 (×8): 237 mL via ORAL
  Filled 2015-12-22 (×14): qty 237

## 2015-12-22 MED ORDER — MORPHINE SULFATE (PF) 2 MG/ML IV SOLN
2.0000 mg | INTRAVENOUS | Status: DC | PRN
  Administered 2015-12-22 – 2015-12-25 (×9): 2 mg via INTRAVENOUS
  Filled 2015-12-22 (×9): qty 1

## 2015-12-22 NOTE — Progress Notes (Signed)
PROGRESS NOTE                                                                                                                                                                                                             Patient Demographics:    [name], is a 55 y.o. female, DOB - 02/14/1961, [id number]  Admit date - 12/17/2015   Admitting Physician Louellen Molder, MD  Outpatient Primary MD for the patient is Anthoney Harada, MD  LOS - 4  Outpatient Specialists: none  Chief Complaint  Patient presents with  . Nausea  . Emesis  . Shortness of Breath       Brief Narrative   55 year old female with history of alcohol abuse, major depression, hearing impairment presented to the ED with nausea and vomiting with epigastric pain, and gap metabolic acidosis/lactic acidosis. She had leukocytosis of 20k and was admitted to telemetry. During the evening she went into acute respiratory failure leading to cardiac arrest after choking on a pill. CPR was initiated with ROSC in 6 minutes. She was transferred to ICU intubated. She was found to have right lower lobe pneumonia. Also found to have NSTEMI. Patient placed on pressors and IV heparin. She was successfully extubated on 8/11 and  transferred to hospitalist service.    Subjective:   No overnight issues.   Assessment  & Plan :    Principal Problem: Acute hypoxic respiratory failure Likely secondary to aspiration pneumonia. Required intubation and ICU monitoring. Continue Unasyn. Stable on room air. Cultures negative.  Active Problems: Cardiac arrest with NSTEMI Likely contributed acute respiratory failure. IV heparin discontinued due to thrombus cytopenia  Continue aspirin, metoprolol and Lipitor. 2-D echo shows EF of 55-60% with no wall motion abnormality.  Cardiology holding off on cardiac cath.  Aspiration pneumonia Continue empiric Unasyn.    Alcohol  dependence ([place]) Counseled strongly on cessation. She wishes to quit. Social work consulted.  Anion gap metabolic acidosis/lactic acidosis Suspected due to heavy alcohol use with dehydration and vomiting. Now resolved with hydration.  Dysphagia with history of esophageal paresis  full liquid diet after seen by SLP   Anemia/thrombocytopenia Suspected in the setting of acute illness/alcohol abuse Discontinue IV heparin. Monitor closely.    Major depression, chronic (HCC) Stable. Trazodone on hold due to prolonged QTC.     Acute kidney injury (  [place]) Secondary to dehydration. Required aggressive hydration and now resolved.     Essential hypertension Switch to by mouth metoprolol..  Prn hydralazine   Hypokalemia replenished    Hx of MVR     Code Status :Full code  Family Communication  : none at bedside  Disposition Plan  :  Home possibly in the next 24-48 hours if not getting cardiac cath and cell counts improved.    Barriers For Discharge :  Improving symptoms  Consults  :   PCCM cardiology  Procedures  :  ETT 8/09 >> 8/11 Lt IJ CVL 8/09 >> 2d echo  DVT Prophylaxis  :SCDs   Lab Results  Component Value Date   PLT 67 (L) 12/22/2015    Antibiotics  :    Anti-infectives    Start     Dose/Rate Route Frequency Ordered Stop   12/18/15 0000  Ampicillin-Sulbactam (UNASYN) 3 g in sodium chloride 0.9 % 100 mL IVPB     3 g 100 mL/hr over 60 Minutes Intravenous Every 6 hours 12/17/15 2352          Objective:   Vitals:   12/22/15 0151 12/22/15 0514 12/22/15 0522 12/22/15 1150  BP: 127/86 136/86  (!) 143/98  Pulse: (!) 106 99    Resp:  18    Temp:  99.2 F (37.3 C)    TempSrc:  Oral    SpO2:  97%    Weight:   70.3 kg (154 lb 14.4 oz)   Height:        Wt Readings from Last 3 Encounters:  12/22/15 70.3 kg (154 lb 14.4 oz)  12/09/15 61.2 kg (135 lb)  12/06/15 62.1 kg (137 lb)     Intake/Output Summary (Last 24 hours) at 12/22/15 1427 Last data  filed at 12/22/15 1327  Gross per 24 hour  Intake          2164.16 ml  Output              300 ml  Net          1864.16 ml     Physical Exam  Gen: not in distress HEENT: no pallor, moist mucosa, supple neck,  left IJ  Chest: clear b/l, no added sounds CVS: N S1&S2, no murmurs, rubs or gallop GI: soft, NT, ND, BS+ Musculoskeletal: warm, no edema     Data Review:    CBC  Recent Labs Lab 12/18/15 0443 12/20/15 0455 12/20/15 1145 12/21/15 0400 12/22/15 0410  WBC 5.6 5.3 5.6 4.1 3.0*  HGB 10.1* 8.0* 8.4* 8.4* 8.0*  HCT 30.4* 24.6* 25.4* 25.7* 24.5*  PLT 217 75* 74* 70* 67*  MCV 96.2 94.6 94.8 94.5 95.0  MCH 32.0 30.8 31.3 30.9 31.0  MCHC 33.2 32.5 33.1 32.7 32.7  RDW 15.5 17.6* 17.5* 16.7* 16.5*    Chemistries   Recent Labs Lab 12/17/15 1544  12/17/15 2235 12/18/15 0443  12/18/15 1545  12/19/15 0450 12/19/15 0737 12/19/15 1610 12/20/15 0455 12/21/15 0400 12/22/15 0410  NA 140  < > 141 135  < > 137  < > 141 141 144 145 138 139  K 3.8  < > 6.0* 4.1  < > 2.7*  < > 2.7* 3.3* 3.1* 3.3* 3.3* 3.7  CL 98*  < > 113* 106  < > 103  < > 105 105 108 111 104 105  CO2 7*  < > <7* 11*  < > 21*  < > 28 28 27 27 27  27  GLUCOSE 76  < > 146* 347*  < > 284*  < > 110* 140* 99 90 94 84  BUN 17  < > 15 20  < > 21*  < > 19 16 18 12 8 8   CREATININE 1.23*  < > 1.04* 1.68*  < > 1.61*  < > 1.03* 0.94 1.05* 0.93 0.75 0.90  CALCIUM 8.1*  < > 6.4* 6.4*  < > 6.7*  < > 7.1* 7.1* 7.2* 7.2* 7.7* 8.4*  MG  --   < > 2.0 1.8  --  1.4*  --  2.3  --   --  1.7  --   --   AST 77*  --   --   --   --   --   --   --   --   --  93* 56*  --   ALT 34  --   --   --   --   --   --   --   --   --  29 28  --   ALKPHOS 130*  --   --   --   --   --   --   --   --   --  68 77  --   BILITOT 0.4  --   --   --   --   --   --   --   --   --  0.6 0.5  --   < > = values in this interval not  displayed. ------------------------------------------------------------------------------------------------------------------  Recent Labs  12/20/15 0455  CHOL 149  HDL 35*  LDLCALC 81  TRIG 167*  CHOLHDL 4.3    Lab Results  Component Value Date   HGBA1C 4.6 (L) 12/18/2015   ------------------------------------------------------------------------------------------------------------------ No results for input(s): TSH, T4TOTAL, T3FREE, THYROIDAB in the last 72 hours.  Invalid input(s): FREET3 ------------------------------------------------------------------------------------------------------------------ No results for input(s): VITAMINB12, FOLATE, FERRITIN, TIBC, IRON, RETICCTPCT in the last 72 hours.  Coagulation profile  Recent Labs Lab 12/19/15 1015  INR 1.25    No results for input(s): DDIMER in the last 72 hours.  Cardiac Enzymes  Recent Labs Lab 12/19/15 0737 12/19/15 1438 12/19/15 2038  TROPONINI 18.59* 9.89* 7.63*   ------------------------------------------------------------------------------------------------------------------ No results found for: BNP  Inpatient Medications  Scheduled Meds: . ampicillin-sulbactam (UNASYN) IV  3 g Intravenous Q6H  . antiseptic oral rinse  7 mL Mouth Rinse BID  . aspirin  81 mg Per Tube Daily  . atorvastatin  40 mg Oral q1800  . famotidine (PEPCID) IV  20 mg Intravenous Q12H  . feeding supplement (GLUCERNA SHAKE)  237 mL Oral TID BM  . folic acid  1 mg Intravenous Daily  . lidocaine  1 patch Transdermal Q24H  . metoprolol  5 mg Intravenous Q6H  . multivitamin  15 mL Oral Daily  . sodium chloride flush  3 mL Intravenous Q12H  . thiamine injection  100 mg Intravenous Daily   Continuous Infusions:   PRN Meds:.acetaminophen **OR** acetaminophen, albuterol, diphenhydrAMINE, hydrALAZINE, HYDROcodone-acetaminophen, lip balm, LORazepam, morphine injection, nitroGLYCERIN, sodium chloride, sodium chloride flush  Micro  Results Recent Results (from the past 240 hour(s))  Culture, blood (routine x 2)     Status: None   Collection Time: 12/17/15  6:49 PM  Result Value Ref Range Status   Specimen Description BLOOD LEFT HAND  Final   Special Requests IN PEDIATRIC BOTTLE 2CC  Final   Culture   Final    NO GROWTH 5 DAYS  Performed at [facility]    Report Status 12/22/2015 FINAL  Final  Culture, blood (routine x 2)     Status: None   Collection Time: 12/17/15  6:49 PM  Result Value Ref Range Status   Specimen Description BLOOD LEFT HAND  Final   Special Requests BOTTLES DRAWN AEROBIC ONLY 5CC  Final   Culture   Final    NO GROWTH 5 DAYS Performed at [facility]    Report Status 12/22/2015 FINAL  Final  MRSA PCR Screening     Status: Abnormal   Collection Time: 12/17/15  9:42 PM  Result Value Ref Range Status   MRSA by PCR POSITIVE (A) NEGATIVE Final    Comment:        The GeneXpert MRSA Assay (FDA approved for NASAL specimens only), is one component of a comprehensive MRSA colonization surveillance program. It is not intended to diagnose MRSA infection nor to guide or monitor treatment for MRSA infections. RESULT CALLED TO, READ BACK BY AND VERIFIED WITH: M.BARHAM,RN AT 0045 12/18/15 BY W.SHEA   Culture, respiratory (NON-Expectorated)     Status: None   Collection Time: 12/17/15 11:32 PM  Result Value Ref Range Status   Specimen Description TRACHEAL ASPIRATE  Final   Special Requests Normal  Final   Gram Stain   Final    MODERATE WBC PRESENT, PREDOMINANTLY PMN FEW GRAM POSITIVE COCCI IN CLUSTERS    Culture   Final    Consistent with normal respiratory flora. Performed at [facility]    Report Status 12/20/2015 FINAL  Final  C difficile quick scan w PCR reflex     Status: None   Collection Time: 12/19/15  6:53 PM  Result Value Ref Range Status   C Diff antigen NEGATIVE NEGATIVE Final   C Diff toxin NEGATIVE NEGATIVE Final   C Diff interpretation No C.  difficile detected.  Final    Radiology Reports Ct Abdomen Pelvis Wo Contrast  Result Date: 12/06/2015 CLINICAL DATA:  55 year old female with abdominal pain EXAM: CT ABDOMEN AND PELVIS WITHOUT CONTRAST TECHNIQUE: Multidetector CT imaging of the abdomen and pelvis was performed following the standard protocol without IV contrast. COMPARISON:  Abdominal CT dated 10/06/2015 and ultrasound dated 10/21/2015 FINDINGS: Evaluation of this exam is limited in the absence of intravenous contrast. Evaluation is also limited due to respiratory motion artifact. There is cardiomegaly. Mechanical mitral valve. There is hypoattenuation of the cardiac blood pool suggestive of a degree of anemia. Clinical correlation is recommended. There is slight prominence of the pulmonary vasculature is and the visualized lung bases which may represent mild congestive changes. No intra-abdominal free air or free fluid. Cholecystectomy. Fatty liver. The pancreas, spleen, adrenal glands, kidneys, visualized ureters, and urinary bladder appear unremarkable. The uterus is anteverted and grossly unremarkable. There is sigmoid diverticulosis without active inflammatory changes. There is a small hiatal hernia. There is no evidence of bowel obstruction or active inflammation. There is mild aortoiliac atherosclerotic disease. The abdominal aorta and IVC are grossly unremarkable on this noncontrast study. No portal venous gas identified. There is no adenopathy. There is a small fat containing umbilical hernia. The abdominal wall soft tissues are otherwise unremarkable. There is degenerative changes of the spine. Scoliosis. Multilevel endplate irregularity and disc space narrowing. There is a chronic appearing 5 mm retropulsed fragment from the posterior aspect of the superior endplate of the L1 vertebrae. No acute fracture. IMPRESSION: No acute intra-abdominal pelvic pathology. Diverticulosis. Fatty liver. Electronically Signed  By: Anner Crete  M.D.   On: 12/06/2015 06:44  Dg Chest 2 View  Result Date: 12/17/2015 CLINICAL DATA:  Shortness of breath, nausea and vomiting beginning this morning. EXAM: CHEST  2 VIEW COMPARISON:  PA and lateral chest 07/17/2015. FINDINGS: The lungs are clear. Heart size is normal. The patient is status post mitral valve repair. No pneumothorax or pleural effusion. Remote T12 compression fracture is seen. IMPRESSION: No acute disease. Electronically Signed   By: Inge Rise M.D.   On: 12/17/2015 15:23   Dg Forearm Right  Result Date: 12/04/2015 CLINICAL DATA:  Fall today with right distal forearm and wrist pain with swelling and bruising. EXAM: RIGHT FOREARM - 2 VIEW COMPARISON:  None. FINDINGS: Comminuted fracture the distal radial metaphysis extends to the articular surface apex anterior angulation noted at fracture site. No associated ulnar fracture is identified. IMPRESSION: Comminuted distal radius fracture with intra-articular extension. Electronically Signed   By: Misty Stanley M.D.   On: 12/04/2015 19:16  Dg Wrist Complete Right  Result Date: 12/04/2015 CLINICAL DATA:  Fall earlier today with wrist pain and swelling. EXAM: RIGHT WRIST - COMPLETE 3+ VIEW COMPARISON:  None. FINDINGS: Comminuted fracture of the distal radius identified with intra-articular extension. No associated fracture the distal ulna. Carpal alignment is anatomic. There are some degenerative changes at the first carpometacarpal joint and also at the first MCP joint. IMPRESSION: Comminuted distal radius fracture with intra-articular extension. Electronically Signed   By: Misty Stanley M.D.   On: 12/04/2015 19:17  Dg Abd 1 View  Result Date: 12/18/2015 CLINICAL DATA:  OG tube placement. EXAM: ABDOMEN - 1 VIEW COMPARISON:  12/06/2015 FINDINGS: An OG tube is identified with tip overlying the distal stomach. The bowel gas pattern is unremarkable. Defibrillator pads overlying the lower chest noted. IMPRESSION: OG tube with tip overlying  the distal stomach. Electronically Signed   By: Margarette Canada M.D.   On: 12/18/2015 01:15   Dg Chest Port 1 View  Result Date: 12/20/2015 CLINICAL DATA:  Patient with history of pneumonia and cardiomyopathy. EXAM: PORTABLE CHEST 1 VIEW COMPARISON:  Chest radiograph 12/19/2015 FINDINGS: Interval extubation and removal of enteric tube. Left IJ central venous catheter tip projects over the superior vena cava. Monitoring leads overlie the patient. Stable cardiomegaly. Interval worsening of airspace consolidation within the right mid and lower lung. Probable tiny bilateral pleural effusions. No pneumothorax. IMPRESSION: Interval increase in right mid and lower lung consolidation. Probable small bilateral pleural effusions. Interval extubation. Electronically Signed   By: Lovey Newcomer M.D.   On: 12/20/2015 07:17   Dg Chest Port 1 View  Result Date: 12/19/2015 CLINICAL DATA:  Pneumonia. EXAM: PORTABLE CHEST 1 VIEW COMPARISON:  12/18/2015. FINDINGS: Endotracheal tube, NG tube, left IJ line stable position. Prior cardiac valve repair. Stable cardiomegaly. Progressive bibasilar atelectasis and infiltrates. Small right pleural effusion. No pneumothorax . IMPRESSION: 1. Lines and tubes in stable position. 2. Progressive bibasilar atelectasis and infiltrates. Small right pleural effusion. 3. No pneumothorax. Electronically Signed   By: Marcello Moores  Register   On: 12/19/2015 07:04   Dg Chest Port 1 View  Result Date: 12/18/2015 CLINICAL DATA:  Pneumonia, followup, intubation, central line, history cardiomyopathy, pulmonary embolism, hypertension, GERD EXAM: PORTABLE CHEST 1 VIEW COMPARISON:  Portable exam 0943 hours compared 12/17/2015 FINDINGS: Tip of endotracheal tube projects 5.9 cm above carina. Nasogastric tube extends into stomach. LEFT jugular central venous catheter tip projects over SVC near cavoatrial junction. External pacing leads project over chest. Enlargement of cardiac  silhouette post MVR. Mediastinal contours  normal vascularity normal. Persistent RIGHT middle lobe consolidation consistent with pneumonia. Question developing LEFT lower lobe consolidation as well. Remaining lungs clear. No pleural effusion or pneumothorax. IMPRESSION: Persistent RIGHT middle lobe pneumonia with question developing infiltrate versus atelectasis in LEFT lower lobe. Line and tube positions as above. Electronically Signed   By: Lavonia Dana M.D.   On: 12/18/2015 11:13   Dg Chest Port 1 View  Result Date: 12/18/2015 CLINICAL DATA:  Central line placement.  Initial encounter. EXAM: PORTABLE CHEST 1 VIEW COMPARISON:  Chest radiograph performed earlier today at 10:32 p.m. FINDINGS: The patient's left IJ line is noted ending about the distal SVC. The patient's endotracheal tube is seen ending 2-3 cm above the carina. The right lower lobe airspace opacification is compatible with pneumonia, similar in appearance to the prior study. No pleural effusion or pneumothorax is seen. The cardiomediastinal silhouette is mildly enlarged. The patient is status post median sternotomy. A valve replacement is noted. External pacing pads are seen. No acute osseous abnormalities are identified. IMPRESSION: 1. Left IJ line noted ending about the distal SVC. 2. Endotracheal tube seen ending 2-3 cm above the carina. 3. Right lower lobe pneumonia, similar in appearance to the recent prior study. 4. Mild cardiomegaly. Electronically Signed   By: Garald Balding M.D.   On: 12/18/2015 00:17   Dg Chest Port 1 View  Result Date: 12/17/2015 CLINICAL DATA:  CPR and intubation. EXAM: PORTABLE CHEST 1 VIEW COMPARISON:  12/17/2015. FINDINGS: Endotracheal tube is now identified with tip 2.3 cm above the carina. New dense consolidation/opacity throughout the medial right mid and lower lung noted. There is no evidence of pleural effusion or pneumothorax. Defibrillator pad overlying the lower chest noted. Cardiomegaly and cardiac valve replacement again identified. IMPRESSION:  Endotracheal tube with tip 2.3 cm above the carina. New dense consolidations/opacity within the medial right mid and lower lung. Electronically Signed   By: Margarette Canada M.D.   On: 12/17/2015 23:00   Dg Abdomen Acute W/chest  Result Date: 12/06/2015 CLINICAL DATA:  55 year old female with abdominal pain EXAM: DG ABDOMEN ACUTE W/ 1V CHEST COMPARISON:  Chest radiograph dated 10/23/2015 FINDINGS: The lungs are clear. No pleural effusion or pneumothorax. Stable mild cardiomegaly. Median sternotomy wires and CABG vascular clips. Mechanical cardiac valve. There is no bowel dilatation or evidence of obstruction. No free air or radiopaque calculi. Right upper quadrant cholecystectomy clips. There is degenerative changes of the spine and scoliosis. No acute fracture. IMPRESSION: No acute cardiopulmonary process. No bowel obstruction. Electronically Signed   By: Anner Crete M.D.   On: 12/06/2015 03:31   Time Spent in minutes  25   Louellen Molder M.D on 12/22/2015 at 2:27 PM  Between 7am to 7pm - Pager - [phone]  After 7pm go to [email or web address] - password [facility]  [facility] -  Office  [phone]

## 2015-12-22 NOTE — Telephone Encounter (Signed)
Phone call to pt regarding Reclast and she stated that she is in hospital at this time and unsure of when she will be going home. She was admitted and during admission had a heart attack. I will forward this information to Dr Toney Rakes in regards to Reclast infusion.

## 2015-12-22 NOTE — Progress Notes (Signed)
Subjective:  Complains of chest wall soreness from CPR.   Objective:   Vital Signs : Vitals:   12/21/15 2135 12/22/15 0151 12/22/15 0514 12/22/15 0522  BP: 124/85 127/86 136/86   Pulse: 98 (!) 106 99   Resp: 16  18   Temp: 99.1 F (37.3 C)  99.2 F (37.3 C)   TempSrc: Oral  Oral   SpO2: 99%  97%   Weight:    154 lb 14.4 oz (70.3 kg)  Height:        Intake/Output from previous day:  Intake/Output Summary (Last 24 hours) at 12/22/15 0803 Last data filed at 12/22/15 0515  Gross per 24 hour  Intake          2143.33 ml  Output             1000 ml  Net          1143.33 ml    I/O since admission: +7847  Wt Readings from Last 3 Encounters:  12/22/15 154 lb 14.4 oz (70.3 kg)  12/09/15 135 lb (61.2 kg)  12/06/15 137 lb (62.1 kg)    Medications: . ampicillin-sulbactam (UNASYN) IV  3 g Intravenous Q6H  . antiseptic oral rinse  7 mL Mouth Rinse BID  . aspirin  81 mg Per Tube Daily  . atorvastatin  40 mg Oral q1800  . famotidine (PEPCID) IV  20 mg Intravenous Q12H  . folic acid  1 mg Intravenous Daily  . metoprolol  2.5 mg Intravenous Q4H  . multivitamin  15 mL Oral Daily  . mupirocin ointment  1 application Nasal BID  . sodium chloride flush  3 mL Intravenous Q12H  . thiamine injection  100 mg Intravenous Daily    . sodium chloride 50 mL/hr at 12/22/15 0406    Physical Exam:   General appearance: c/o chest wall pain Neck: no adenopathy, no JVD, supple, symmetrical, trachea midline and thyroid not enlarged, symmetric, no tenderness/mass/nodules Lungs: no wheezing, decreased inspiratory effort Chest wall: tenderness to palpation.  Heart: RR at 96; 1/6 sem; no s3 Abdomen: soft, non-tender; bowel sounds normal; no masses,  no organomegaly Extremities: R arm in hard cast Neurologic: Grossly normal   Rate: 96  Rhythm: NSR  12/20/15 ECG (independently read by me): NSR at 96; LBBB has resolved; NST changes; QTc 424 msec.  12/17/15 ECG (independently read by me): ST  vs A Flutter at 137  Lab Results:   Recent Labs  12/19/15 1610 12/20/15 0455 12/21/15 0400 12/22/15 0410  NA 144 145 138 139  K 3.1* 3.3* 3.3* 3.7  CL 108 111 104 105  CO2 '27 27 27 27  ' GLUCOSE 99 90 94 84  BUN '18 12 8 8  ' CREATININE 1.05* 0.93 0.75 0.90  CALCIUM 7.2* 7.2* 7.7* 8.4*  MG  --  1.7  --   --   PHOS 2.6 2.5  --   --     Hepatic Function Latest Ref Rng & Units 12/21/2015 12/20/2015 12/17/2015  Total Protein 6.5 - 8.1 g/dL 5.5(L) 5.6(L) 8.1  Albumin 3.5 - 5.0 g/dL 2.6(L) 2.8(L) 4.8  AST 15 - 41 U/L 56(H) 93(H) 77(H)  ALT 14 - 54 U/L 28 29 34  Alk Phosphatase 38 - 126 U/L 77 68 130(H)  Total Bilirubin 0.3 - 1.2 mg/dL 0.5 0.6 0.4  Bilirubin, Direct 0.1 - 0.5 mg/dL 0.1 - -     Recent Labs  12/20/15 1145 12/21/15 0400 12/22/15 0410  WBC 5.6 4.1 3.0*  HGB 8.4* 8.4* 8.0*  HCT 25.4* 25.7* 24.5*  MCV 94.8 94.5 95.0  PLT 74* 70* 67*     Recent Labs  12/19/15 1438 12/19/15 2038  TROPONINI 9.89* 7.63*    Lab Results  Component Value Date   TSH 6.205 (H) 10/24/2015   No results for input(s): HGBA1C in the last 72 hours.   Recent Labs  12/20/15 0455 12/21/15 0400  PROT 5.6* 5.5*  ALBUMIN 2.8* 2.6*  AST 93* 56*  ALT 29 28  ALKPHOS 68 77  BILITOT 0.6 0.5  BILIDIR  --  0.1  IBILI  --  0.4    Recent Labs  12/19/15 1015  INR 1.25     Lipid Panel     Component Value Date/Time   CHOL 149 12/20/2015 0455   TRIG 167 (H) 12/20/2015 0455   HDL 35 (L) 12/20/2015 0455   CHOLHDL 4.3 12/20/2015 0455   VLDL 33 12/20/2015 0455   LDLCALC 81 12/20/2015 0455     Echocardiogram: 10/2015 Study Conclusions  - Left ventricle: The cavity size was normal. Wall thickness was normal. Systolic function was normal. The estimated ejection fraction was in the range of 55% to 60%. Wall motion was normal; there were no regional wall motion abnormalities. Features are consistent with a pseudonormal left ventricular filling pattern, with concomitant  abnormal relaxation and increased filling pressure (grade 2 diastolic dysfunction). - Mitral valve: Calcified annulus. - Left atrium: The atrium was mildly dilated. - Right ventricle: The cavity size was mildly dilated. Wall thickness was normal. Systolic function was mildly reduced. - Right atrium: The atrium was moderately dilated. - Tricuspid valve: There was moderate regurgitation. - Pulmonary arteries: Systolic pressure was mildly increased. PA peak pressure: 43 mm Hg (S).   Assessment/Plan:   Principal Problem:   Metabolic acidosis Active Problems:   Status post patch closure of ASD   Major depression, chronic (HCC)   Dysphagia   Acute kidney injury (HCC)   Thrombocytopenia (HCC)   Essential hypertension   Lactic acidosis   Leucocytosis   Dehydration   Starvation ketoacidosis   Alcohol abuse   Pressure ulcer   Protein-calorie malnutrition, severe   NSTEMI (non-ST elevated myocardial infarction) (HCC)   Typical atrial flutter (HCC)   Pneumonia   Aspiration pneumonia of right lower lobe due to gastric secretions (HCC)   Hypokalemia   Cardiac arrest ([place])     1. NSTEMI - patient presented 12/17/15 with nausea and vomiting in the setting of consuming alcohol in excess. Labs on admission consistent with metabolic acidosis and lactic acidosis. Had a cardiac arrest on admission thought to be secondary to aspiration PNA as this occurred after taking a PO medication. Required CPR for 6 minutes and 1 dose of epinephrine with ROSC.  - cyclic troponin values obtained and have been 0.03, 0.10, 7.89, 13.88, and 21.83. EKG on admission showed sinus tachycardia, HR 137, with a new LBBB.  -  echocardiogram in 10/28/15 showed an EF of 55-60% with no wall motion abnormalities.  - with her significantly elevated troponin, recent cardiac arrest, and new LBBB -?  cardiac catheterization warranted once her current clinical condition improves, however her chronic alcohol use and  medication noncompliance is certainly a concern. Would not be a good DAPT candidate, possibly a candidate for LEADERS FREE study?  2. ASD/ Mitral Valve Repair - s/p ASD repair in 1973, mitral valve repair in 2007 - Followed by Dr Marlou Porch  3. Aspiration PNA - - currently on Unasyn -  per admitting team  4. Metabolic Acidosis/ Lactic Acidosis - lactic acid 12.19 on admission, improved to 1.9 today. - per admitting team  5. Chronic Normocytic Anemia - Hgb variable from 9.1 - 15.0 this year. 13.7 on admission, 10.1 on 8/10.--> 8.0 / 24.5 today. - continue to monitor. No evidence of active bleeding   6. Alcohol Abuse/ Medication Noncompliance - has reported consuming nothing but Vodka in the days leading up to her hospital admission. Not taking any medications. - concern for medication compliance going forward.   7. Hypokalema - K 3.7; supplement to 4.0 range;  8. Esophageal Dysmotility - Pt states that she cannot swallow pills. She can drink liquids.  She is taking some PO meds when crushed   9. Thrombocytopenia -   Plts down to 67K- off Heparin  10. Abnormal LFTs - contributed by ETOH, hypotension  Plan- She is currently on a thin liquid diet, getting Lopressor IV. Pt asked about coronary angiogram- will discuss with MD.   Patient seen and examined. Agree with assessment and plan. Swallowing study data reviewed.  Tolerating iv Lopressor 2.5 mg q 4 hrs. Rate now in the 90s. BP is stable.  Will change to Lopressor 5 mg iv q 6 hrs.  With plt count 67K and no chest pain will hold off on cardiac cath, unless change in symptomotology. ? Will she be able take crush  pills and take in apple sauce.   Troy Sine, MD, [facility] 12/22/2015 2:10 PM

## 2015-12-22 NOTE — Telephone Encounter (Signed)
Will wait until she gets out of the hospital and contacts [place].

## 2015-12-22 NOTE — Progress Notes (Signed)
Nutrition Follow-up  DOCUMENTATION CODES:   Severe malnutrition in context of acute illness/injury  INTERVENTION:   -Provide Glucerna Shake po TID, each supplement provides 220 kcal and 10 grams of protein -Encourage PO intake -RD to continue to monitor  NUTRITION DIAGNOSIS:   Inadequate oral intake related to inability to eat as evidenced by NPO status.  Progressed to full liquid.  GOAL:   Patient will meet greater than or equal to 90% of their needs  Progressing.  MONITOR:   Diet advancement, Weight trends, Labs, Skin, I & O's  ASSESSMENT:   55 y.o. female with alcohol abuse, major depression, hypertension, hearing impairment who was recently hospitalized with increased anion gap metabolic acidosis likely in the setting of alcohol abuse with starvation ketoacidosis and poor by mouth intake. Patient lives with her daughter and drinks almost regularly. For past few days she has been drinking vodka and not eating or drinking any other fluids. Since started having nausea vomiting with epigastric pain this morning. Denied hematemesis, tremors or seizures. Denied any headaches, fevers, chills, chest pain, palpitations or shortness of breath. Patient informed that she had not eaten anything for past 3 days and also not taken her medications.   Patient extubated 8/11.  SLP evaluated 8/13: pt with chronic dysphagia, recommended full liquid diet, pt with mild aspiration risk. Pt with increased needs d/t ETOH abuse PTA. RD to order Glucerna shakes TID along with full liquid diet. PO intake: 50%. Weight has increased 22 lb since admission 8/9.  Medications: Folic acid injection daily, Liquid MVI daily, Thiamine injection daily  Labs reviewed: CBGs: 77-99  Diet Order:  Diet full liquid Room service appropriate? Yes; Fluid consistency: Thin  Skin:  Wound (see comment) (Stage 1 sacral pressure injury)  Last BM:  8/12  Height:   Ht Readings from Last 1 Encounters:  12/21/15 5\' 3"   (1.6 m)    Weight:   Wt Readings from Last 1 Encounters:  12/22/15 154 lb 14.4 oz (70.3 kg)    Ideal Body Weight:  52.27 kg  BMI:  Body mass index is 27.44 kg/m.  Estimated Nutritional Needs:   Kcal:  1500-1700  Protein:  60-72 grams (1-1.2 grams/kg)  Fluid:  1.5-1.7 L/day  EDUCATION NEEDS:   No education needs identified at this time  Clayton Bibles, MS, RD, LDN Pager: [phone] After Hours Pager: [phone]

## 2015-12-22 NOTE — Progress Notes (Signed)
Pharmacy Antibiotic Note  [name] is a 55 y.o. female admitted on 12/17/2015 with aspiration pna. Pharmacy has been consulted for unasyn dosing.  Plan:  Continue Unasyn 3 Gm IV q6h  Follow up ability to transition to PO  Height: 5\' 3"  (160 cm) Weight: 154 lb 14.4 oz (70.3 kg) IBW/kg (Calculated) : 52.4  Temp (24hrs), Avg:99 F (37.2 C), Min:98.7 F (37.1 C), Max:99.2 F (37.3 C)   Recent Labs Lab 12/18/15 0227 12/18/15 0443 12/18/15 0910 12/18/15 1545  12/18/15 2345  12/19/15 0737 12/19/15 1610 12/20/15 0455 12/20/15 1145 12/21/15 0400 12/22/15 0410  WBC  --  5.6  --   --   --   --   --   --   --  5.3 5.6 4.1 3.0*  CREATININE  --  1.68* 1.80* 1.61*  < >  --   < > 0.94 1.05* 0.93  --  0.75 0.90  LATICACIDVEN 2.0*  --  5.3* 5.9*  --  3.0*  --  1.9  --   --   --   --   --   < > = values in this interval not displayed.  Estimated Creatinine Clearance: 66.5 mL/min (by C-G formula based on SCr of 0.9 mg/dL).    Allergies  Allergen Reactions  . Depakote [Divalproex Sodium] Anaphylaxis  . Diazepam Other (See Comments)    Died on operating table  . Valium Anaphylaxis  . Tizanidine Hcl Other (See Comments)    "passed out"    Antimicrobials this admission: Unasyn 8/10 >>   Microbiology results: 8/9 BCx: NGTD 8/9 sputum: (GS = GPC clusters) - normal flora (final) 8/9 MRSA PCR: positive 8/11 C.diff: neg  Thank you for allowing pharmacy to be a part of this patient's care.  Peggyann Juba, PharmD, BCPS Pager: [phone] 12/22/2015 12:13 PM

## 2015-12-23 DIAGNOSIS — R1310 Dysphagia, unspecified: Secondary | ICD-10-CM

## 2015-12-23 LAB — CBC
HCT: 24.6 % — ABNORMAL LOW (ref 36.0–46.0)
Hemoglobin: 8 g/dL — ABNORMAL LOW (ref 12.0–15.0)
MCH: 31 pg (ref 26.0–34.0)
MCHC: 32.5 g/dL (ref 30.0–36.0)
MCV: 95.3 fL (ref 78.0–100.0)
PLATELETS: 76 10*3/uL — AB (ref 150–400)
RBC: 2.58 MIL/uL — AB (ref 3.87–5.11)
RDW: 16.8 % — AB (ref 11.5–15.5)
WBC: 2.3 10*3/uL — ABNORMAL LOW (ref 4.0–10.5)

## 2015-12-23 MED ORDER — METOPROLOL TARTRATE 50 MG PO TABS
75.0000 mg | ORAL_TABLET | Freq: Two times a day (BID) | ORAL | Status: DC
  Administered 2015-12-23 – 2015-12-26 (×6): 75 mg via ORAL
  Filled 2015-12-23 (×6): qty 1

## 2015-12-23 MED ORDER — AMOXICILLIN-POT CLAVULANATE 875-125 MG PO TABS
1.0000 | ORAL_TABLET | Freq: Two times a day (BID) | ORAL | Status: DC
  Administered 2015-12-23 – 2015-12-26 (×6): 1 via ORAL
  Filled 2015-12-23 (×6): qty 1

## 2015-12-23 NOTE — Progress Notes (Signed)
Hospital Problem List     Principal Problem:   Metabolic acidosis Active Problems:   Status post patch closure of ASD   Major depression, chronic (HCC)   Dysphagia   Acute kidney injury (HCC)   Thrombocytopenia (HCC)   Essential hypertension   Lactic acidosis   Leucocytosis   Dehydration   Starvation ketoacidosis   Alcohol abuse   Pressure ulcer   Protein-calorie malnutrition, severe   NSTEMI (non-ST elevated myocardial infarction) (HCC)   Typical atrial flutter (HCC)   Pneumonia   Aspiration pneumonia of right lower lobe due to gastric secretions (HCC)   Hypokalemia   Cardiac arrest ([place])   Uncontrolled hypertension     Patient Profile:   Primary Cardiologist: Dr. Marlou Porch  55 y.o. female with past medical history of ASD (s/p repair along with mitral valve repair), prior PE, HLD, depression, alcohol abuse and recent admission for starvation ketoacidosis (from 7/29 - 7/30-2017) who presented to [facility] ED on 12/17/2015 for persistent nausea and vomiting starting earlier that day. Had cardiac arrest in setting of aspiration. Cards consulted for NSTEMI.   Subjective   Concerned about still being on Full Liquids, requesting soft food. Ambulated with PT this AM. Having chest discomfort secondary to CPR, worse with deep inspiration.   Inpatient Medications    . amoxicillin-clavulanate  1 tablet Oral Q12H  . ampicillin-sulbactam (UNASYN) IV  3 g Intravenous Q6H  . antiseptic oral rinse  7 mL Mouth Rinse BID  . aspirin  81 mg Per Tube Daily  . atorvastatin  40 mg Oral q1800  . famotidine (PEPCID) IV  20 mg Intravenous Q12H  . feeding supplement (GLUCERNA SHAKE)  237 mL Oral TID BM  . folic acid  1 mg Intravenous Daily  . lidocaine  1 patch Transdermal Q24H  . metoprolol tartrate  50 mg Oral BID  . multivitamin  15 mL Oral Daily  . sodium chloride flush  3 mL Intravenous Q12H  . thiamine injection  100 mg Intravenous Daily    Vital Signs    Vitals:   12/22/15 1500  12/22/15 2149 12/23/15 0548 12/23/15 0700  BP: 131/81 (!) 141/95 (!) 155/94   Pulse: 89 98 (!) 101 96  Resp: 18 20    Temp: 98.7 F (37.1 C) 99.2 F (37.3 C) 98.3 F (36.8 C)   TempSrc: Oral Oral Oral   SpO2: 98% 100% 95%   Weight:   152 lb (68.9 kg)   Height:        Intake/Output Summary (Last 24 hours) at 12/23/15 1053 Last data filed at 12/23/15 0619  Gross per 24 hour  Intake             1090 ml  Output              850 ml  Net              240 ml   Filed Weights   12/21/15 1359 12/22/15 0522 12/23/15 0548  Weight: 161 lb 4.8 oz (73.2 kg) 154 lb 14.4 oz (70.3 kg) 152 lb (68.9 kg)    Physical Exam    General: Well developed, well nourished, female appearing in no acute distress. Head: Normocephalic, atraumatic.  Neck: Supple without bruits, JVD not elevated. Lungs:  Resp regular and unlabored, CTA without wheezing or rales. Heart: RRR, S1, S2, no S3, S4, or murmur; no rub. Tender to palpation along sternum.  Abdomen: Soft, non-tender, non-distended with normoactive bowel sounds. No hepatomegaly. No  rebound/guarding. No obvious abdominal masses. Extremities: No clubbing, cyanosis, or edema. Distal pedal pulses are 2+ bilaterally. Neuro: Alert and oriented X 3. Moves all extremities spontaneously. Psych: Normal affect.  Labs    CBC  Recent Labs  12/22/15 1600 12/23/15 0425  WBC 2.8* 2.3*  NEUTROABS 1.4*  --   HGB 8.6* 8.0*  HCT 26.5* 24.6*  MCV 96.0 95.3  PLT 82* 76*   Basic Metabolic Panel  Recent Labs  12/21/15 0400 12/22/15 0410  NA 138 139  K 3.3* 3.7  CL 104 105  CO2 27 27  GLUCOSE 94 84  BUN 8 8  CREATININE 0.75 0.90  CALCIUM 7.7* 8.4*   Liver Function Tests  Recent Labs  12/21/15 0400  AST 56*  ALT 28  ALKPHOS 77  BILITOT 0.5  PROT 5.5*  ALBUMIN 2.6*    Telemetry    NSR, HR in 90's - low-100's.   ECG    Sinus tachycardia, HR 104. No acute ST or T-wave changes.   Cardiac Studies and Radiology    Ct Abdomen Pelvis Wo  Contrast  Result Date: 12/06/2015 CLINICAL DATA:  55 year old female with abdominal pain EXAM: CT ABDOMEN AND PELVIS WITHOUT CONTRAST TECHNIQUE: Multidetector CT imaging of the abdomen and pelvis was performed following the standard protocol without IV contrast. COMPARISON:  Abdominal CT dated 10/06/2015 and ultrasound dated 10/21/2015 FINDINGS: Evaluation of this exam is limited in the absence of intravenous contrast. Evaluation is also limited due to respiratory motion artifact. There is cardiomegaly. Mechanical mitral valve. There is hypoattenuation of the cardiac blood pool suggestive of a degree of anemia. Clinical correlation is recommended. There is slight prominence of the pulmonary vasculature is and the visualized lung bases which may represent mild congestive changes. No intra-abdominal free air or free fluid. Cholecystectomy. Fatty liver. The pancreas, spleen, adrenal glands, kidneys, visualized ureters, and urinary bladder appear unremarkable. The uterus is anteverted and grossly unremarkable. There is sigmoid diverticulosis without active inflammatory changes. There is a small hiatal hernia. There is no evidence of bowel obstruction or active inflammation. There is mild aortoiliac atherosclerotic disease. The abdominal aorta and IVC are grossly unremarkable on this noncontrast study. No portal venous gas identified. There is no adenopathy. There is a small fat containing umbilical hernia. The abdominal wall soft tissues are otherwise unremarkable. There is degenerative changes of the spine. Scoliosis. Multilevel endplate irregularity and disc space narrowing. There is a chronic appearing 5 mm retropulsed fragment from the posterior aspect of the superior endplate of the L1 vertebrae. No acute fracture. IMPRESSION: No acute intra-abdominal pelvic pathology. Diverticulosis. Fatty liver. Electronically Signed   By: Anner Crete M.D.   On: 12/06/2015 06:44  Dg Chest 2 View  Result Date:  12/17/2015 CLINICAL DATA:  Shortness of breath, nausea and vomiting beginning this morning. EXAM: CHEST  2 VIEW COMPARISON:  PA and lateral chest 07/17/2015. FINDINGS: The lungs are clear. Heart size is normal. The patient is status post mitral valve repair. No pneumothorax or pleural effusion. Remote T12 compression fracture is seen. IMPRESSION: No acute disease. Electronically Signed   By: Inge Rise M.D.   On: 12/17/2015 15:23   Dg Forearm Right  Result Date: 12/04/2015 CLINICAL DATA:  Fall today with right distal forearm and wrist pain with swelling and bruising. EXAM: RIGHT FOREARM - 2 VIEW COMPARISON:  None. FINDINGS: Comminuted fracture the distal radial metaphysis extends to the articular surface apex anterior angulation noted at fracture site. No associated ulnar fracture is identified. IMPRESSION:  Comminuted distal radius fracture with intra-articular extension. Electronically Signed   By: Misty Stanley M.D.   On: 12/04/2015 19:16  Dg Wrist Complete Right  Result Date: 12/04/2015 CLINICAL DATA:  Fall earlier today with wrist pain and swelling. EXAM: RIGHT WRIST - COMPLETE 3+ VIEW COMPARISON:  None. FINDINGS: Comminuted fracture of the distal radius identified with intra-articular extension. No associated fracture the distal ulna. Carpal alignment is anatomic. There are some degenerative changes at the first carpometacarpal joint and also at the first MCP joint. IMPRESSION: Comminuted distal radius fracture with intra-articular extension. Electronically Signed   By: Misty Stanley M.D.   On: 12/04/2015 19:17  Dg Abd 1 View  Result Date: 12/18/2015 CLINICAL DATA:  OG tube placement. EXAM: ABDOMEN - 1 VIEW COMPARISON:  12/06/2015 FINDINGS: An OG tube is identified with tip overlying the distal stomach. The bowel gas pattern is unremarkable. Defibrillator pads overlying the lower chest noted. IMPRESSION: OG tube with tip overlying the distal stomach. Electronically Signed   By: Margarette Canada M.D.    On: 12/18/2015 01:15   Dg Chest Port 1 View  Result Date: 12/20/2015 CLINICAL DATA:  Patient with history of pneumonia and cardiomyopathy. EXAM: PORTABLE CHEST 1 VIEW COMPARISON:  Chest radiograph 12/19/2015 FINDINGS: Interval extubation and removal of enteric tube. Left IJ central venous catheter tip projects over the superior vena cava. Monitoring leads overlie the patient. Stable cardiomegaly. Interval worsening of airspace consolidation within the right mid and lower lung. Probable tiny bilateral pleural effusions. No pneumothorax. IMPRESSION: Interval increase in right mid and lower lung consolidation. Probable small bilateral pleural effusions. Interval extubation. Electronically Signed   By: Lovey Newcomer M.D.   On: 12/20/2015 07:17   Dg Chest Port 1 View  Result Date: 12/19/2015 CLINICAL DATA:  Pneumonia. EXAM: PORTABLE CHEST 1 VIEW COMPARISON:  12/18/2015. FINDINGS: Endotracheal tube, NG tube, left IJ line stable position. Prior cardiac valve repair. Stable cardiomegaly. Progressive bibasilar atelectasis and infiltrates. Small right pleural effusion. No pneumothorax . IMPRESSION: 1. Lines and tubes in stable position. 2. Progressive bibasilar atelectasis and infiltrates. Small right pleural effusion. 3. No pneumothorax. Electronically Signed   By: Marcello Moores  Register   On: 12/19/2015 07:04   Dg Chest Port 1 View  Result Date: 12/18/2015 CLINICAL DATA:  Pneumonia, followup, intubation, central line, history cardiomyopathy, pulmonary embolism, hypertension, GERD EXAM: PORTABLE CHEST 1 VIEW COMPARISON:  Portable exam 0943 hours compared 12/17/2015 FINDINGS: Tip of endotracheal tube projects 5.9 cm above carina. Nasogastric tube extends into stomach. LEFT jugular central venous catheter tip projects over SVC near cavoatrial junction. External pacing leads project over chest. Enlargement of cardiac silhouette post MVR. Mediastinal contours normal vascularity normal. Persistent RIGHT middle lobe  consolidation consistent with pneumonia. Question developing LEFT lower lobe consolidation as well. Remaining lungs clear. No pleural effusion or pneumothorax. IMPRESSION: Persistent RIGHT middle lobe pneumonia with question developing infiltrate versus atelectasis in LEFT lower lobe. Line and tube positions as above. Electronically Signed   By: Lavonia Dana M.D.   On: 12/18/2015 11:13   Dg Chest Port 1 View  Result Date: 12/18/2015 CLINICAL DATA:  Central line placement.  Initial encounter. EXAM: PORTABLE CHEST 1 VIEW COMPARISON:  Chest radiograph performed earlier today at 10:32 p.m. FINDINGS: The patient's left IJ line is noted ending about the distal SVC. The patient's endotracheal tube is seen ending 2-3 cm above the carina. The right lower lobe airspace opacification is compatible with pneumonia, similar in appearance to the prior study. No pleural effusion or  pneumothorax is seen. The cardiomediastinal silhouette is mildly enlarged. The patient is status post median sternotomy. A valve replacement is noted. External pacing pads are seen. No acute osseous abnormalities are identified. IMPRESSION: 1. Left IJ line noted ending about the distal SVC. 2. Endotracheal tube seen ending 2-3 cm above the carina. 3. Right lower lobe pneumonia, similar in appearance to the recent prior study. 4. Mild cardiomegaly. Electronically Signed   By: Garald Balding M.D.   On: 12/18/2015 00:17   Dg Chest Port 1 View  Result Date: 12/17/2015 CLINICAL DATA:  CPR and intubation. EXAM: PORTABLE CHEST 1 VIEW COMPARISON:  12/17/2015. FINDINGS: Endotracheal tube is now identified with tip 2.3 cm above the carina. New dense consolidation/opacity throughout the medial right mid and lower lung noted. There is no evidence of pleural effusion or pneumothorax. Defibrillator pad overlying the lower chest noted. Cardiomegaly and cardiac valve replacement again identified. IMPRESSION: Endotracheal tube with tip 2.3 cm above the carina. New  dense consolidations/opacity within the medial right mid and lower lung. Electronically Signed   By: Margarette Canada M.D.   On: 12/17/2015 23:00   Dg Abdomen Acute W/chest  Result Date: 12/06/2015 CLINICAL DATA:  55 year old female with abdominal pain EXAM: DG ABDOMEN ACUTE W/ 1V CHEST COMPARISON:  Chest radiograph dated 10/23/2015 FINDINGS: The lungs are clear. No pleural effusion or pneumothorax. Stable mild cardiomegaly. Median sternotomy wires and CABG vascular clips. Mechanical cardiac valve. There is no bowel dilatation or evidence of obstruction. No free air or radiopaque calculi. Right upper quadrant cholecystectomy clips. There is degenerative changes of the spine and scoliosis. No acute fracture. IMPRESSION: No acute cardiopulmonary process. No bowel obstruction. Electronically Signed   By: Anner Crete M.D.   On: 12/06/2015 03:31   Echocardiogram: 12/19/2015 Study Conclusions  - Left ventricle: The cavity size was normal. Wall thickness was   normal. Systolic function was normal. The estimated ejection   fraction was in the range of 55% to 60%. Wall motion was normal;   there were no regional wall motion abnormalities. The study is   not technically sufficient to allow evaluation of LV diastolic   function. - Mitral valve: Prior procedures included surgical repair. There   was mild regurgitation. Valve area by pressure half-time: 2.35   cm^2. - Left atrium: The atrium was moderately dilated. - Tricuspid valve: There was moderate regurgitation. - Pulmonary arteries: Systolic pressure was moderately increased. - Pericardium, extracardiac: A small pericardial effusion was   identified.  Impressions:  - Normal LV systolic function; s/p MV repair with mild MR; moderate   LAE; moderate TR with moderately elevated pulmonary pressure.  Assessment & Plan    1. NSTEMI - patient presented 12/17/15 with nausea and vomiting in the setting of consuming alcohol in excess. Labs on  admission consistent with metabolic acidosis and lactic acidosis. Had a cardiac arrest on admission thought to be secondary to aspiration PNA as this occurred after taking a PO medication. Required CPR for 6 minutes and 1 dose of epinephrine with ROSC.  - cyclic troponin values obtained and have been 0.03, 0.10, 7.89, 13.88, and 21.83. EKG on admission showed sinus tachycardia, HR 137, with a new LBBB.  -  echocardiogram in 10/28/15 showed an EF of 55-60% with no wall motion abnormalities. Echo this admission shows preserved EF of 55-60%. - with her significantly elevated troponin, recent cardiac arrest, and new LBBB (now resolved), a cardiac catheterization is warranted once her current clinical condition improves, however her chronic alcohol  use and medication noncompliance is certainly a concern. Would not be a good DAPT candidate. Current Hgb and thrombocytopenia inhibitory to this as well.  - continue ASA, statin (LFT's improved), and BB (switched to PO Lopressor 50mg  BID by admitting team).  2. ASD/ Mitral Valve Repair - s/p ASD repair in 1973, mitral valve repair in 2007 - Followed by Dr Marlou Porch  3. Aspiration PNA - currently on Unasyn - per admitting team  4. Metabolic Acidosis/ Lactic Acidosis - lactic acid 12.19 on admission, improved to 1.9. - per admitting team  5. Chronic Normocytic Anemia - Hgb variable from 9.1 - 15.0 this year. 13.7 on admission, 8.0 today - continue to monitor. No evidence of active bleeding   6. Alcohol Abuse/ Medication Noncompliance - has reported consuming nothing but Vodka in the days leading up to her hospital admission. Not taking any medications. - concern for medication compliance going forward.   7. Hypokalema - K 3.7 on 8/14; supplement to 4.0 range  8. Esophageal Dysmotility - Pt states that she cannot swallow pills. She can drink liquids.  She is taking some PO meds when crushed   9. Thrombocytopenia - platelets at 76K on  8/15.  10. Abnormal LFTs - contributed by ETOH, hypotension. Improved. AST 56, ALT 28.  No further inpatient cardiac workup anticipated. Will arrange follow-up with Dr. Marlou Porch so potential cardiac catheterization can be discussed at that time if labs have improved. Patient with poor sleep last night, feels tired and weak this AM. Wishes for diet to be advanced and see how she does with this prior to discharge. Perhaps repeat Speech Therapy eval?    Arna Medici , PA-C 10:53 AM 12/23/2015 Pager: [phone]   Patient seen and examined. Agree with assessment and plan. Continues to have chest wall pain. Now having advancing diet and switched to oral lopressor at 50 mg bid.  Pulse in the 80 range, not yet steady state. .Platelets remain low but slightly increasing. ECG today with ST at 103 and minimal nonspecific T changes. Continue to monitor in hospital today. Once dc'd will need f/u with Dr. Marlou Porch and an ischemic evaluation.   Troy Sine, MD, [facility] 12/23/2015 1:29 PM

## 2015-12-23 NOTE — Care Management Important Message (Signed)
Important Message  Patient Details IM Letter given to Cookie/Case Manager to present to Patient Name: [name] MRN: [id number] Date of Birth: 1960-06-21   Medicare Important Message Given:  Yes    Camillo Flaming 12/23/2015, 10:51 AM

## 2015-12-23 NOTE — Progress Notes (Signed)
PROGRESS NOTE                                                                                                                                                                                                             Patient Demographics:    [name], is a 55 y.o. female, DOB - 1961/04/09, [id number]  Admit date - 12/17/2015   Admitting Physician Louellen Molder, MD  Outpatient Primary MD for the patient is Anthoney Harada, MD  LOS - 5  Outpatient Specialists: none  Chief Complaint  Patient presents with  . Nausea  . Emesis  . Shortness of Breath       Brief Narrative   55 year old female with history of alcohol abuse, major depression, hearing impairment presented to the ED with nausea and vomiting with epigastric pain, and gap metabolic acidosis/lactic acidosis. She had leukocytosis of 20k and was admitted to telemetry. During the evening she went into acute respiratory failure leading to cardiac arrest after choking on a pill. CPR was initiated with ROSC in 6 minutes. She was transferred to ICU intubated. She was found to have right lower lobe pneumonia. Also found to have NSTEMI. Patient placed on pressors and IV heparin. She was successfully extubated on 8/11 and  transferred to hospitalist service.    Subjective:   No overnight issues.   Assessment  & Plan :    Principal Problem: Acute hypoxic respiratory failure Likely secondary to aspiration pneumonia. Required intubation and ICU monitoring. Switch Unasyn to Augmentin if able to swallow pill. Stable on room air. Cultures negative.  Active Problems: Cardiac arrest with NSTEMI Likely contributed acute respiratory failure. IV heparin discontinued due to thrombocytopenia. Cardiology recommend outpatient workup and follow-up with Dr. Marlou Porch. Recommend in-hospital monitoring today for adequate blood pressure and heart rate control.  Continue  aspirin, metoprolol and Lipitor. 2-D echo shows EF of 55-60% with no wall motion abnormality.    Aspiration pneumonia Switch Unasyn to Augmentin to complete 7 day course of antibiotic.    Alcohol dependence ([place]) Counseled strongly on cessation. She wishes to quit. Social work consulted.  Anion gap metabolic acidosis/lactic acidosis Suspected due to heavy alcohol use with dehydration and vomiting. Resolved with hydration.  Dysphagia with history of esophageal paresis On full liquid diet. SLP to reevaluate if diet can be advanced   Anemia/thrombocytopenia Suspected in  the setting of acute illness/alcohol abuse Off IV heparin. Monitor.    Major depression, chronic (HCC) Stable. Trazodone on hold due to prolonged QTC.     Acute kidney injury ([place]) Secondary to dehydration. Required aggressive hydration and now resolved.     Essential hypertension Adjust metoprolol dose.  Prn hydralazine   Hypokalemia replenished    Hx of MVR     Code Status :Full code  Family Communication  : none at bedside  Disposition Plan  :  Home possibly in a.m. if heart rate , blood pressure better controlled and pancytopenia improving  Barriers For Discharge :  Improving symptoms  Consults  :   PCCM cardiology  Procedures  :  ETT 8/09 >> 8/11 Lt IJ CVL 8/09 >> 2d echo  DVT Prophylaxis  :SCDs   Lab Results  Component Value Date   PLT 76 (L) 12/23/2015    Antibiotics  :    Anti-infectives    Start     Dose/Rate Route Frequency Ordered Stop   12/23/15 2200  amoxicillin-clavulanate (AUGMENTIN) 875-125 MG per tablet 1 tablet     1 tablet Oral Every 12 hours 12/23/15 1009     12/18/15 0000  Ampicillin-Sulbactam (UNASYN) 3 g in sodium chloride 0.9 % 100 mL IVPB     3 g 100 mL/hr over 60 Minutes Intravenous Every 6 hours 12/17/15 2352          Objective:   Vitals:   12/22/15 2149 12/23/15 0548 12/23/15 0700 12/23/15 1322  BP: (!) 141/95 (!) 155/94  (!) 148/99  Pulse: 98  (!) 101 96 90  Resp: 20   16  Temp: 99.2 F (37.3 C) 98.3 F (36.8 C)    TempSrc: Oral Oral    SpO2: 100% 95%  100%  Weight:  68.9 kg (152 lb)    Height:        Wt Readings from Last 3 Encounters:  12/23/15 68.9 kg (152 lb)  12/09/15 61.2 kg (135 lb)  12/06/15 62.1 kg (137 lb)     Intake/Output Summary (Last 24 hours) at 12/23/15 1405 Last data filed at 12/23/15 0900  Gross per 24 hour  Intake             1310 ml  Output              650 ml  Net              660 ml     Physical Exam  Gen: not in distress HEENT: no pallor, moist mucosa, supple neck,  left IJ  Chest: clear b/l, no added sounds CVS: N S1&S2, no murmurs, rubs or gallop GI: soft, NT, ND, BS+ Musculoskeletal: warm, no edema     Data Review:    CBC  Recent Labs Lab 12/20/15 1145 12/21/15 0400 12/22/15 0410 12/22/15 1600 12/23/15 0425  WBC 5.6 4.1 3.0* 2.8* 2.3*  HGB 8.4* 8.4* 8.0* 8.6* 8.0*  HCT 25.4* 25.7* 24.5* 26.5* 24.6*  PLT 74* 70* 67* 82* 76*  MCV 94.8 94.5 95.0 96.0 95.3  MCH 31.3 30.9 31.0 31.2 31.0  MCHC 33.1 32.7 32.7 32.5 32.5  RDW 17.5* 16.7* 16.5* 16.7* 16.8*  LYMPHSABS  --   --   --  0.6*  --   MONOABS  --   --   --  0.6  --   EOSABS  --   --   --  0.2  --   BASOSABS  --   --   --  0.0  --     Chemistries   Recent Labs Lab 12/17/15 1544  12/17/15 2235 12/18/15 0443  12/18/15 1545  12/19/15 0450 12/19/15 0737 12/19/15 1610 12/20/15 0455 12/21/15 0400 12/22/15 0410  NA 140  < > 141 135  < > 137  < > 141 141 144 145 138 139  K 3.8  < > 6.0* 4.1  < > 2.7*  < > 2.7* 3.3* 3.1* 3.3* 3.3* 3.7  CL 98*  < > 113* 106  < > 103  < > 105 105 108 111 104 105  CO2 7*  < > <7* 11*  < > 21*  < > 28 28 27 27 27 27   GLUCOSE 76  < > 146* 347*  < > 284*  < > 110* 140* 99 90 94 84  BUN 17  < > 15 20  < > 21*  < > 19 16 18 12 8 8   CREATININE 1.23*  < > 1.04* 1.68*  < > 1.61*  < > 1.03* 0.94 1.05* 0.93 0.75 0.90  CALCIUM 8.1*  < > 6.4* 6.4*  < > 6.7*  < > 7.1* 7.1* 7.2* 7.2* 7.7*  8.4*  MG  --   < > 2.0 1.8  --  1.4*  --  2.3  --   --  1.7  --   --   AST 77*  --   --   --   --   --   --   --   --   --  93* 56*  --   ALT 34  --   --   --   --   --   --   --   --   --  29 28  --   ALKPHOS 130*  --   --   --   --   --   --   --   --   --  68 77  --   BILITOT 0.4  --   --   --   --   --   --   --   --   --  0.6 0.5  --   < > = values in this interval not displayed. ------------------------------------------------------------------------------------------------------------------ No results for input(s): CHOL, HDL, LDLCALC, TRIG, CHOLHDL, LDLDIRECT in the last 72 hours.  Lab Results  Component Value Date   HGBA1C 4.6 (L) 12/18/2015   ------------------------------------------------------------------------------------------------------------------ No results for input(s): TSH, T4TOTAL, T3FREE, THYROIDAB in the last 72 hours.  Invalid input(s): FREET3 ------------------------------------------------------------------------------------------------------------------ No results for input(s): VITAMINB12, FOLATE, FERRITIN, TIBC, IRON, RETICCTPCT in the last 72 hours.  Coagulation profile  Recent Labs Lab 12/19/15 1015  INR 1.25    No results for input(s): DDIMER in the last 72 hours.  Cardiac Enzymes  Recent Labs Lab 12/19/15 0737 12/19/15 1438 12/19/15 2038  TROPONINI 18.59* 9.89* 7.63*   ------------------------------------------------------------------------------------------------------------------ No results found for: BNP  Inpatient Medications  Scheduled Meds: . amoxicillin-clavulanate  1 tablet Oral Q12H  . ampicillin-sulbactam (UNASYN) IV  3 g Intravenous Q6H  . antiseptic oral rinse  7 mL Mouth Rinse BID  . aspirin  81 mg Per Tube Daily  . atorvastatin  40 mg Oral q1800  . famotidine (PEPCID) IV  20 mg Intravenous Q12H  . feeding supplement (GLUCERNA SHAKE)  237 mL Oral TID BM  . folic acid  1 mg Intravenous Daily  . lidocaine  1 patch  Transdermal Q24H  . metoprolol tartrate  50 mg  Oral BID  . multivitamin  15 mL Oral Daily  . sodium chloride flush  3 mL Intravenous Q12H  . thiamine injection  100 mg Intravenous Daily   Continuous Infusions:   PRN Meds:.acetaminophen **OR** acetaminophen, albuterol, diphenhydrAMINE, hydrALAZINE, HYDROcodone-acetaminophen, lip balm, morphine injection, nitroGLYCERIN, sodium chloride, sodium chloride flush  Micro Results Recent Results (from the past 240 hour(s))  Culture, blood (routine x 2)     Status: None   Collection Time: 12/17/15  6:49 PM  Result Value Ref Range Status   Specimen Description BLOOD LEFT HAND  Final   Special Requests IN PEDIATRIC BOTTLE [place]  Final   Culture   Final    NO GROWTH 5 DAYS Performed at [facility]    Report Status 12/22/2015 FINAL  Final  Culture, blood (routine x 2)     Status: None   Collection Time: 12/17/15  6:49 PM  Result Value Ref Range Status   Specimen Description BLOOD LEFT HAND  Final   Special Requests BOTTLES DRAWN AEROBIC ONLY 5CC  Final   Culture   Final    NO GROWTH 5 DAYS Performed at [facility]    Report Status 12/22/2015 FINAL  Final  MRSA PCR Screening     Status: Abnormal   Collection Time: 12/17/15  9:42 PM  Result Value Ref Range Status   MRSA by PCR POSITIVE (A) NEGATIVE Final    Comment:        The GeneXpert MRSA Assay (FDA approved for NASAL specimens only), is one component of a comprehensive MRSA colonization surveillance program. It is not intended to diagnose MRSA infection nor to guide or monitor treatment for MRSA infections. RESULT CALLED TO, READ BACK BY AND VERIFIED WITH: M.BARHAM,RN AT 0045 12/18/15 BY W.SHEA   Culture, respiratory (NON-Expectorated)     Status: None   Collection Time: 12/17/15 11:32 PM  Result Value Ref Range Status   Specimen Description TRACHEAL ASPIRATE  Final   Special Requests Normal  Final   Gram Stain   Final    MODERATE WBC PRESENT, PREDOMINANTLY  PMN FEW GRAM POSITIVE COCCI IN CLUSTERS    Culture   Final    Consistent with normal respiratory flora. Performed at [facility]    Report Status 12/20/2015 FINAL  Final  C difficile quick scan w PCR reflex     Status: None   Collection Time: 12/19/15  6:53 PM  Result Value Ref Range Status   C Diff antigen NEGATIVE NEGATIVE Final   C Diff toxin NEGATIVE NEGATIVE Final   C Diff interpretation No C. difficile detected.  Final    Radiology Reports Ct Abdomen Pelvis Wo Contrast  Result Date: 12/06/2015 CLINICAL DATA:  55 year old female with abdominal pain EXAM: CT ABDOMEN AND PELVIS WITHOUT CONTRAST TECHNIQUE: Multidetector CT imaging of the abdomen and pelvis was performed following the standard protocol without IV contrast. COMPARISON:  Abdominal CT dated 10/06/2015 and ultrasound dated 10/21/2015 FINDINGS: Evaluation of this exam is limited in the absence of intravenous contrast. Evaluation is also limited due to respiratory motion artifact. There is cardiomegaly. Mechanical mitral valve. There is hypoattenuation of the cardiac blood pool suggestive of a degree of anemia. Clinical correlation is recommended. There is slight prominence of the pulmonary vasculature is and the visualized lung bases which may represent mild congestive changes. No intra-abdominal free air or free fluid. Cholecystectomy. Fatty liver. The pancreas, spleen, adrenal glands, kidneys, visualized ureters, and urinary bladder appear unremarkable. The uterus is anteverted and  grossly unremarkable. There is sigmoid diverticulosis without active inflammatory changes. There is a small hiatal hernia. There is no evidence of bowel obstruction or active inflammation. There is mild aortoiliac atherosclerotic disease. The abdominal aorta and IVC are grossly unremarkable on this noncontrast study. No portal venous gas identified. There is no adenopathy. There is a small fat containing umbilical hernia. The abdominal wall soft  tissues are otherwise unremarkable. There is degenerative changes of the spine. Scoliosis. Multilevel endplate irregularity and disc space narrowing. There is a chronic appearing 5 mm retropulsed fragment from the posterior aspect of the superior endplate of the L1 vertebrae. No acute fracture. IMPRESSION: No acute intra-abdominal pelvic pathology. Diverticulosis. Fatty liver. Electronically Signed   By: Anner Crete M.D.   On: 12/06/2015 06:44  Dg Chest 2 View  Result Date: 12/17/2015 CLINICAL DATA:  Shortness of breath, nausea and vomiting beginning this morning. EXAM: CHEST  2 VIEW COMPARISON:  PA and lateral chest 07/17/2015. FINDINGS: The lungs are clear. Heart size is normal. The patient is status post mitral valve repair. No pneumothorax or pleural effusion. Remote T12 compression fracture is seen. IMPRESSION: No acute disease. Electronically Signed   By: Inge Rise M.D.   On: 12/17/2015 15:23   Dg Forearm Right  Result Date: 12/04/2015 CLINICAL DATA:  Fall today with right distal forearm and wrist pain with swelling and bruising. EXAM: RIGHT FOREARM - 2 VIEW COMPARISON:  None. FINDINGS: Comminuted fracture the distal radial metaphysis extends to the articular surface apex anterior angulation noted at fracture site. No associated ulnar fracture is identified. IMPRESSION: Comminuted distal radius fracture with intra-articular extension. Electronically Signed   By: Misty Stanley M.D.   On: 12/04/2015 19:16  Dg Wrist Complete Right  Result Date: 12/04/2015 CLINICAL DATA:  Fall earlier today with wrist pain and swelling. EXAM: RIGHT WRIST - COMPLETE 3+ VIEW COMPARISON:  None. FINDINGS: Comminuted fracture of the distal radius identified with intra-articular extension. No associated fracture the distal ulna. Carpal alignment is anatomic. There are some degenerative changes at the first carpometacarpal joint and also at the first MCP joint. IMPRESSION: Comminuted distal radius fracture with  intra-articular extension. Electronically Signed   By: Misty Stanley M.D.   On: 12/04/2015 19:17  Dg Abd 1 View  Result Date: 12/18/2015 CLINICAL DATA:  OG tube placement. EXAM: ABDOMEN - 1 VIEW COMPARISON:  12/06/2015 FINDINGS: An OG tube is identified with tip overlying the distal stomach. The bowel gas pattern is unremarkable. Defibrillator pads overlying the lower chest noted. IMPRESSION: OG tube with tip overlying the distal stomach. Electronically Signed   By: Margarette Canada M.D.   On: 12/18/2015 01:15   Dg Chest Port 1 View  Result Date: 12/20/2015 CLINICAL DATA:  Patient with history of pneumonia and cardiomyopathy. EXAM: PORTABLE CHEST 1 VIEW COMPARISON:  Chest radiograph 12/19/2015 FINDINGS: Interval extubation and removal of enteric tube. Left IJ central venous catheter tip projects over the superior vena cava. Monitoring leads overlie the patient. Stable cardiomegaly. Interval worsening of airspace consolidation within the right mid and lower lung. Probable tiny bilateral pleural effusions. No pneumothorax. IMPRESSION: Interval increase in right mid and lower lung consolidation. Probable small bilateral pleural effusions. Interval extubation. Electronically Signed   By: Lovey Newcomer M.D.   On: 12/20/2015 07:17   Dg Chest Port 1 View  Result Date: 12/19/2015 CLINICAL DATA:  Pneumonia. EXAM: PORTABLE CHEST 1 VIEW COMPARISON:  12/18/2015. FINDINGS: Endotracheal tube, NG tube, left IJ line stable position. Prior cardiac valve repair. Stable  cardiomegaly. Progressive bibasilar atelectasis and infiltrates. Small right pleural effusion. No pneumothorax . IMPRESSION: 1. Lines and tubes in stable position. 2. Progressive bibasilar atelectasis and infiltrates. Small right pleural effusion. 3. No pneumothorax. Electronically Signed   By: Marcello Moores  Register   On: 12/19/2015 07:04   Dg Chest Port 1 View  Result Date: 12/18/2015 CLINICAL DATA:  Pneumonia, followup, intubation, central line, history  cardiomyopathy, pulmonary embolism, hypertension, GERD EXAM: PORTABLE CHEST 1 VIEW COMPARISON:  Portable exam 0943 hours compared 12/17/2015 FINDINGS: Tip of endotracheal tube projects 5.9 cm above carina. Nasogastric tube extends into stomach. LEFT jugular central venous catheter tip projects over SVC near cavoatrial junction. External pacing leads project over chest. Enlargement of cardiac silhouette post MVR. Mediastinal contours normal vascularity normal. Persistent RIGHT middle lobe consolidation consistent with pneumonia. Question developing LEFT lower lobe consolidation as well. Remaining lungs clear. No pleural effusion or pneumothorax. IMPRESSION: Persistent RIGHT middle lobe pneumonia with question developing infiltrate versus atelectasis in LEFT lower lobe. Line and tube positions as above. Electronically Signed   By: Lavonia Dana M.D.   On: 12/18/2015 11:13   Dg Chest Port 1 View  Result Date: 12/18/2015 CLINICAL DATA:  Central line placement.  Initial encounter. EXAM: PORTABLE CHEST 1 VIEW COMPARISON:  Chest radiograph performed earlier today at 10:32 p.m. FINDINGS: The patient's left IJ line is noted ending about the distal SVC. The patient's endotracheal tube is seen ending 2-3 cm above the carina. The right lower lobe airspace opacification is compatible with pneumonia, similar in appearance to the prior study. No pleural effusion or pneumothorax is seen. The cardiomediastinal silhouette is mildly enlarged. The patient is status post median sternotomy. A valve replacement is noted. External pacing pads are seen. No acute osseous abnormalities are identified. IMPRESSION: 1. Left IJ line noted ending about the distal SVC. 2. Endotracheal tube seen ending 2-3 cm above the carina. 3. Right lower lobe pneumonia, similar in appearance to the recent prior study. 4. Mild cardiomegaly. Electronically Signed   By: Garald Balding M.D.   On: 12/18/2015 00:17   Dg Chest Port 1 View  Result Date:  12/17/2015 CLINICAL DATA:  CPR and intubation. EXAM: PORTABLE CHEST 1 VIEW COMPARISON:  12/17/2015. FINDINGS: Endotracheal tube is now identified with tip 2.3 cm above the carina. New dense consolidation/opacity throughout the medial right mid and lower lung noted. There is no evidence of pleural effusion or pneumothorax. Defibrillator pad overlying the lower chest noted. Cardiomegaly and cardiac valve replacement again identified. IMPRESSION: Endotracheal tube with tip 2.3 cm above the carina. New dense consolidations/opacity within the medial right mid and lower lung. Electronically Signed   By: Margarette Canada M.D.   On: 12/17/2015 23:00   Dg Abdomen Acute W/chest  Result Date: 12/06/2015 CLINICAL DATA:  55 year old female with abdominal pain EXAM: DG ABDOMEN ACUTE W/ 1V CHEST COMPARISON:  Chest radiograph dated 10/23/2015 FINDINGS: The lungs are clear. No pleural effusion or pneumothorax. Stable mild cardiomegaly. Median sternotomy wires and CABG vascular clips. Mechanical cardiac valve. There is no bowel dilatation or evidence of obstruction. No free air or radiopaque calculi. Right upper quadrant cholecystectomy clips. There is degenerative changes of the spine and scoliosis. No acute fracture. IMPRESSION: No acute cardiopulmonary process. No bowel obstruction. Electronically Signed   By: Anner Crete M.D.   On: 12/06/2015 03:31   Time Spent in minutes  25   Louellen Molder M.D on 12/23/2015 at 2:05 PM  Between 7am to 7pm - Pager - [phone]  After  7pm go to [email or web address] - password [facility]  [facility] -  Office  [phone]

## 2015-12-23 NOTE — Evaluation (Signed)
Physical Therapy Evaluation Patient Details Name: CECILIA MORLEY MRN: [id number] DOB: Apr 03, 1961 Today's Date: 12/23/2015   History of Present Illness  55 y.o.female with past medical history of ASD (s/p repair along with mitral valve repair), prior PE, HLD, depression, alcohol abuse and recent admission for starvation ketoacidosis (from 7/29 - 7/30-2017) who presented to [facility] ED on 12/17/2015 for persistent nausea and vomiting starting earlier that day. Had cardiac arrest in setting of aspiration, requiring intubation and ICU stay; Cards consulted for NSTEMI.   Clinical Impression  Pt admitted with above diagnosis. Pt currently with functional limitations due to the deficits listed below (see PT Problem List). *Pt will benefit from skilled PT to increase their independence and safety with mobility to allow discharge to the venue listed below.   Pt is deconditioned and weaker than her baseline; recommend HHPT; pt would benefit from amb with nursing staff as well    Follow Up Recommendations Home health PT    Equipment Recommendations  None recommended by PT    Recommendations for Other Services       Precautions / Restrictions Precautions Required Braces or Orthoses: Other Brace/Splint      Mobility  Bed Mobility Overal bed mobility: Modified Independent                Transfers Overall transfer level: Needs assistance   Transfers: Sit to/from Stand Sit to Stand: Min guard         General transfer comment: for safety  Ambulation/Gait Ambulation/Gait assistance: Min guard Ambulation Distance (Feet): 120 Feet Assistive device: None Gait Pattern/deviations: Step-through pattern;Decreased stride length;Wide base of support     General Gait Details: maintains external rotation bil  hips during gait; unsteady but without overt LOB/ min-guard for safety  Stairs            Wheelchair Mobility    Modified Rankin (Stroke Patients Only)       Balance                                              Pertinent Vitals/Pain Pain Assessment: No/denies pain    Home Living Family/patient expects to be discharged to:: Private residence Living Arrangements: Children (14yo )   Type of Home: Apartment Home Access: Level entry     Home Layout: One level Home Equipment: None      Prior Function Level of Independence: Needs assistance      ADL's / Homemaking Assistance Needed: 55yo dtr assists with laundry; pt can't the distance to walk to apt laundry         Hand Dominance        Extremity/Trunk Assessment   Upper Extremity Assessment: Defer to OT evaluation           Lower Extremity Assessment: Overall WFL for tasks assessed         Communication   Communication: Deaf (uses hearing aide)  Cognition Arousal/Alertness: Awake/alert Behavior During Therapy: WFL for tasks assessed/performed Overall Cognitive Status: Within Functional Limits for tasks assessed                      General Comments      Exercises        Assessment/Plan    PT Assessment Patient needs continued PT services  PT Diagnosis Difficulty walking   PT Problem List Decreased mobility;Decreased activity  tolerance  PT Treatment Interventions Gait training;Functional mobility training;Therapeutic exercise;Therapeutic activities   PT Goals (Current goals can be found in the Care Plan section) Acute Rehab PT Goals Patient Stated Goal: get stronger PT Goal Formulation: With patient Time For Goal Achievement: 12/30/15 Potential to Achieve Goals: Good    Frequency Min 3X/week   Barriers to discharge        Co-evaluation               End of Session Equipment Utilized During Treatment: Gait belt Activity Tolerance: Patient tolerated treatment well Patient left: in chair;with call bell/phone within reach;with chair alarm set           Time: 1046-1100 PT Time Calculation (min) (ACUTE ONLY): 14 min   Charges:    PT Evaluation $PT Eval Low Complexity: 1 Procedure     PT G Codes:        Derreck Wiltsey 2016-01-16, 12:28 PM

## 2015-12-23 NOTE — Evaluation (Signed)
SLP Cancellation Note  Patient Details Name: [name] MRN: [id number] DOB: 1960/09/26   Cancelled treatment:       Reason Eval/Treat Not Completed: Other (comment) (patient needing to use restroom, will return as able)   Luanna Salk, [place] [facility] SLP [phone]

## 2015-12-23 NOTE — Progress Notes (Signed)
Speech Language Pathology Treatment:    Patient Details Name: [name] MRN: [id number] DOB: Aug 04, 1960 Today's Date: 12/23/2015 Time: [id number] SLP Time Calculation (min) (ACUTE ONLY): 38 min  Assessment / Plan / Recommendation Clinical Impression  Pt today reports being "ready" for solid foods.  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    Pt does have poor dentition and admits to her upper partial getting "bent" during hospital stay.  Unfortunately due to inadequate dentition, she can not adequately masticate as there are no dentition to "touch" without partials in place.   Pt also admits she does not always wear her partials to eat.  Advised her to see her dentist to repair partial and assure proper fit to allow adequate mastication.    SLP reviewed compensatory strategies with pt to maximize airway protection/esophageal clearance with intake.  Provided information verbally and in writing.    Pt able to articulate precautions to this SLP  And demonstrated compensation strategies with po intake.  No indication of deficits with soft fruit, graham cracker and water.  Given pt awareness of her chronic dysphagia and now tolerating po medications crushed *advised her to check with her MD or pharmacist*, recommend advance to dys3/thin= extra gravy/sauce.  No SLP follow up. .   Esophagram exam of swallow in  03/2014 " The cervical esophagus is normal. Diffuse esophageal narrowing is noted throughout the mid and distal esophagus. Associated proximal thoracic esophagus diverticulum is present. These findings suggest a long-standing process resulting in diffuse esophageal scarring. Process such as reflux esophagitis and scleroderma could present in this fashion. A process such is Barrett's esophagus or esophageal malignancy cannot be excluded. Hiatal hernia is present. No reflux could be demonstrated. Standardized barium tablet took approximately 15 min to pass into the stomach."   HPI HPI: 55 yo  female presented to ER with n/v, epigastric pain and dyspnea after drinking binge of vodka. She had recent admission for anion gap metabolic acidosis felt to be from starvation ketoacidosis. She has hx of ETOH, depression, HTN, DM, paresis/paralysis of esophagus. (Esophagram 03/11/14: "Diffuse fixed esophageal narrowing noted throughout the mid and distal esophagus. There is associated proximal thoracic esophagus diverticulum. These findings suggest a long-standing process resulting in diffuse esophageal scarring. Chronic reflux esophagitis and scleroderma could present in this fashion. A process such as Barrett's esophagus or esophageal malignancy cannot be excluded. The esophageal narrowing resulted in significant delay in passage of a standardized barium tablet.") Cardiac arrest 8/9; intubated 8/9-11.   Pt describes chronic hx of esophageal deficits, having to eat soft foods and use liquid wash.  She describes frequent accumulation of POs in esophagus until there is spillover into her airway, causing coughing.        SLP Plan  Continue with current plan of care     Recommendations  Diet recommendations: Dysphagia 3 (mechanical soft) Liquids provided via: Cup;Straw Medication Administration: Crushed with puree Supervision: Patient able to self feed Compensations: Slow rate;Small sips/bites Postural Changes and/or Swallow Maneuvers: Out of bed for meals;Seated upright 90 degrees             Oral Care Recommendations: Oral care BID Follow up Recommendations: None Plan: Continue with current plan of care     [place], [facility] SLP [phone]

## 2015-12-24 ENCOUNTER — Encounter (HOSPITAL_COMMUNITY): Payer: Self-pay | Admitting: Student

## 2015-12-24 LAB — CBC
HCT: 23.3 % — ABNORMAL LOW (ref 36.0–46.0)
Hemoglobin: 7.7 g/dL — ABNORMAL LOW (ref 12.0–15.0)
MCH: 31.2 pg (ref 26.0–34.0)
MCHC: 33 g/dL (ref 30.0–36.0)
MCV: 94.3 fL (ref 78.0–100.0)
PLATELETS: 110 10*3/uL — AB (ref 150–400)
RBC: 2.47 MIL/uL — ABNORMAL LOW (ref 3.87–5.11)
RDW: 17.1 % — AB (ref 11.5–15.5)
WBC: 3 10*3/uL — AB (ref 4.0–10.5)

## 2015-12-24 MED ORDER — VITAMIN B-1 100 MG PO TABS
100.0000 mg | ORAL_TABLET | Freq: Every day | ORAL | Status: DC
  Administered 2015-12-25 – 2015-12-26 (×2): 100 mg via ORAL
  Filled 2015-12-24 (×2): qty 1

## 2015-12-24 MED ORDER — DIPHENHYDRAMINE HCL 12.5 MG/5ML PO ELIX: 12.5000 mg | ORAL_SOLUTION | Freq: Four times a day (QID) | ORAL | Status: DC | PRN

## 2015-12-24 MED ORDER — FERROUS SULFATE 325 (65 FE) MG PO TABS
325.0000 mg | ORAL_TABLET | Freq: Two times a day (BID) | ORAL | Status: DC
  Administered 2015-12-24 – 2015-12-26 (×4): 325 mg via ORAL
  Filled 2015-12-24 (×4): qty 1

## 2015-12-24 MED ORDER — FAMOTIDINE 20 MG PO TABS
20.0000 mg | ORAL_TABLET | Freq: Two times a day (BID) | ORAL | Status: DC
  Administered 2015-12-24 – 2015-12-26 (×4): 20 mg via ORAL
  Filled 2015-12-24 (×4): qty 1

## 2015-12-24 MED ORDER — FOLIC ACID 1 MG PO TABS
1.0000 mg | ORAL_TABLET | Freq: Every day | ORAL | Status: DC
  Administered 2015-12-25 – 2015-12-26 (×2): 1 mg via ORAL
  Filled 2015-12-24 (×2): qty 1

## 2015-12-24 NOTE — Progress Notes (Addendum)
PHARMACIST - PHYSICIAN COMMUNICATION DR:   Tyrell Antonio CONCERNING: H2 Blocker IV to Oral Route Change Policy  The patient is receiving Pepcid by the intravenous route. Based on criteria approved by the Pharmacy and [place], the medication is being converted to the equivalent oral dose form as well as thiamine and folic acid.  These criteria include:  -No Active GI bleeding  -Able to tolerate diet of full liquids (or better) or tube feeding  -Able to tolerate other medications by the oral or enteral route   RECOMMENDATION: This patient is receiving diphenhydramine by the intravenous route.  Based on criteria approved by the Pharmacy and Therapeutics Committee, intravenous diphenhydramine is being converted to the equivalent oral dose form(s).   DESCRIPTION: These criteria include:  Diphenhydramine is not prescribed to treat or prevent a severe allergic reaction  Diphenhydramine is not prescribed as premedication prior to receiving blood product, biologic medication, antimicrobial, or chemotherapy agent  The patient has tolerated at least one dose of an oral or enteral medication  The patient has no evidence of active gastrointestinal bleeding or impaired GI absorption (gastrectomy, short bowel, patient on TNA or NPO).  The patient is not undergoing procedural sedation   If you have any questions about this conversion, please contact the Pharmacy Department ([phone]). Thank you.  Peggyann Juba, PharmD, BCPS 12/24/2015 11:44 AM

## 2015-12-24 NOTE — Progress Notes (Signed)
Hospital Problem List     Principal Problem:   Metabolic acidosis Active Problems:   Status post patch closure of ASD   Major depression, chronic (HCC)   Dysphagia   Acute kidney injury (HCC)   Thrombocytopenia (HCC)   Essential hypertension   Lactic acidosis   Leucocytosis   Dehydration   Starvation ketoacidosis   Alcohol abuse   Pressure ulcer   Protein-calorie malnutrition, severe   NSTEMI (non-ST elevated myocardial infarction) (HCC)   Typical atrial flutter (HCC)   Pneumonia   Aspiration pneumonia of right lower lobe due to gastric secretions (HCC)   Hypokalemia   Cardiac arrest ([place])   Uncontrolled hypertension     Patient Profile:   Primary Cardiologist: Dr. Marlou Porch  55 y.o. female with past medical history of ASD (s/p repair along with mitral valve repair), prior PE, HLD, depression, alcohol abuse and recent admission for starvation ketoacidosis (from 7/29 - 7/30-2017) who presented to [facility] ED on 12/17/2015 for persistent nausea and vomiting starting earlier that day. Had cardiac arrest in setting of aspiration. Cards consulted for NSTEMI.   Subjective   Chest discomfort still present from where she required CPR. Patient very confused about her hospital course (treatment for aspiration PNA, why she had a heart attack, etc.). Questions answered as best as possible.   She is very anxious about potentially going home today. Concerned about her ability to care for herself.  Inpatient Medications    . amoxicillin-clavulanate  1 tablet Oral Q12H  . antiseptic oral rinse  7 mL Mouth Rinse BID  . aspirin  81 mg Per Tube Daily  . atorvastatin  40 mg Oral q1800  . famotidine (PEPCID) IV  20 mg Intravenous Q12H  . feeding supplement (GLUCERNA SHAKE)  237 mL Oral TID BM  . folic acid  1 mg Intravenous Daily  . lidocaine  1 patch Transdermal Q24H  . metoprolol tartrate  75 mg Oral BID  . multivitamin  15 mL Oral Daily  . sodium chloride flush  3 mL Intravenous Q12H  .  thiamine injection  100 mg Intravenous Daily    Vital Signs    Vitals:   12/23/15 1322 12/23/15 1915 12/23/15 2036 12/24/15 0520  BP: (!) 148/99  132/85 (!) 133/92  Pulse: 90  90 93  Resp: 16  18 18   Temp:  98.9 F (37.2 C) 98.6 F (37 C) 97.9 F (36.6 C)  TempSrc:  Oral Oral Oral  SpO2: 100%  100% 100%  Weight:    150 lb (68 kg)  Height:        Intake/Output Summary (Last 24 hours) at 12/24/15 0929 Last data filed at 12/23/15 2303  Gross per 24 hour  Intake              550 ml  Output                0 ml  Net              550 ml   Filed Weights   12/22/15 0522 12/23/15 0548 12/24/15 0520  Weight: 154 lb 14.4 oz (70.3 kg) 152 lb (68.9 kg) 150 lb (68 kg)    Physical Exam    General: Well developed, well nourished, female appearing in no acute distress. Head: Normocephalic, atraumatic.  Neck: Supple without bruits, JVD not elevated. Lungs:  Resp regular and unlabored, CTA without wheezing or rales. Heart: RRR, S1, S2, no S3, S4, or murmur; no rub. Tender  to palpation along sternum.  Abdomen: Soft, non-tender, non-distended with normoactive bowel sounds. No hepatomegaly. No rebound/guarding. No obvious abdominal masses. Extremities: No clubbing, cyanosis, or edema. Distal pedal pulses are 2+ bilaterally. Neuro: Alert and oriented X 3. Moves all extremities spontaneously. Psych: Normal affect.  Labs    CBC  Recent Labs  12/22/15 1600 12/23/15 0425 12/24/15 0445  WBC 2.8* 2.3* 3.0*  NEUTROABS 1.4*  --   --   HGB 8.6* 8.0* 7.7*  HCT 26.5* 24.6* 23.3*  MCV 96.0 95.3 94.3  PLT 82* 76* [id number]*   Basic Metabolic Panel  Recent Labs  12/22/15 0410  NA 139  K 3.7  CL 105  CO2 27  GLUCOSE 84  BUN 8  CREATININE 0.90  CALCIUM 8.4*   Liver Function Tests No results for input(s): AST, ALT, ALKPHOS, BILITOT, PROT, ALBUMIN in the last 72 hours.  Telemetry    NSR, HR in 80's - 90's. Occasional PVC's.   ECG    Sinus tachycardia, HR 104. No acute ST or T-wave  changes.   Cardiac Studies and Radiology    Ct Abdomen Pelvis Wo Contrast  Result Date: 12/06/2015 CLINICAL DATA:  55 year old female with abdominal pain EXAM: CT ABDOMEN AND PELVIS WITHOUT CONTRAST TECHNIQUE: Multidetector CT imaging of the abdomen and pelvis was performed following the standard protocol without IV contrast. COMPARISON:  Abdominal CT dated 10/06/2015 and ultrasound dated 10/21/2015 FINDINGS: Evaluation of this exam is limited in the absence of intravenous contrast. Evaluation is also limited due to respiratory motion artifact. There is cardiomegaly. Mechanical mitral valve. There is hypoattenuation of the cardiac blood pool suggestive of a degree of anemia. Clinical correlation is recommended. There is slight prominence of the pulmonary vasculature is and the visualized lung bases which may represent mild congestive changes. No intra-abdominal free air or free fluid. Cholecystectomy. Fatty liver. The pancreas, spleen, adrenal glands, kidneys, visualized ureters, and urinary bladder appear unremarkable. The uterus is anteverted and grossly unremarkable. There is sigmoid diverticulosis without active inflammatory changes. There is a small hiatal hernia. There is no evidence of bowel obstruction or active inflammation. There is mild aortoiliac atherosclerotic disease. The abdominal aorta and IVC are grossly unremarkable on this noncontrast study. No portal venous gas identified. There is no adenopathy. There is a small fat containing umbilical hernia. The abdominal wall soft tissues are otherwise unremarkable. There is degenerative changes of the spine. Scoliosis. Multilevel endplate irregularity and disc space narrowing. There is a chronic appearing 5 mm retropulsed fragment from the posterior aspect of the superior endplate of the L1 vertebrae. No acute fracture. IMPRESSION: No acute intra-abdominal pelvic pathology. Diverticulosis. Fatty liver. Electronically Signed   By: Anner Crete  M.D.   On: 12/06/2015 06:44  Dg Chest 2 View  Result Date: 12/17/2015 CLINICAL DATA:  Shortness of breath, nausea and vomiting beginning this morning. EXAM: CHEST  2 VIEW COMPARISON:  PA and lateral chest 07/17/2015. FINDINGS: The lungs are clear. Heart size is normal. The patient is status post mitral valve repair. No pneumothorax or pleural effusion. Remote T12 compression fracture is seen. IMPRESSION: No acute disease. Electronically Signed   By: Inge Rise M.D.   On: 12/17/2015 15:23   Dg Forearm Right  Result Date: 12/04/2015 CLINICAL DATA:  Fall today with right distal forearm and wrist pain with swelling and bruising. EXAM: RIGHT FOREARM - 2 VIEW COMPARISON:  None. FINDINGS: Comminuted fracture the distal radial metaphysis extends to the articular surface apex anterior angulation noted at fracture site.  No associated ulnar fracture is identified. IMPRESSION: Comminuted distal radius fracture with intra-articular extension. Electronically Signed   By: Misty Stanley M.D.   On: 12/04/2015 19:16  Dg Wrist Complete Right  Result Date: 12/04/2015 CLINICAL DATA:  Fall earlier today with wrist pain and swelling. EXAM: RIGHT WRIST - COMPLETE 3+ VIEW COMPARISON:  None. FINDINGS: Comminuted fracture of the distal radius identified with intra-articular extension. No associated fracture the distal ulna. Carpal alignment is anatomic. There are some degenerative changes at the first carpometacarpal joint and also at the first MCP joint. IMPRESSION: Comminuted distal radius fracture with intra-articular extension. Electronically Signed   By: Misty Stanley M.D.   On: 12/04/2015 19:17  Dg Abd 1 View  Result Date: 12/18/2015 CLINICAL DATA:  OG tube placement. EXAM: ABDOMEN - 1 VIEW COMPARISON:  12/06/2015 FINDINGS: An OG tube is identified with tip overlying the distal stomach. The bowel gas pattern is unremarkable. Defibrillator pads overlying the lower chest noted. IMPRESSION: OG tube with tip overlying  the distal stomach. Electronically Signed   By: Margarette Canada M.D.   On: 12/18/2015 01:15   Dg Chest Port 1 View  Result Date: 12/20/2015 CLINICAL DATA:  Patient with history of pneumonia and cardiomyopathy. EXAM: PORTABLE CHEST 1 VIEW COMPARISON:  Chest radiograph 12/19/2015 FINDINGS: Interval extubation and removal of enteric tube. Left IJ central venous catheter tip projects over the superior vena cava. Monitoring leads overlie the patient. Stable cardiomegaly. Interval worsening of airspace consolidation within the right mid and lower lung. Probable tiny bilateral pleural effusions. No pneumothorax. IMPRESSION: Interval increase in right mid and lower lung consolidation. Probable small bilateral pleural effusions. Interval extubation. Electronically Signed   By: Lovey Newcomer M.D.   On: 12/20/2015 07:17   Dg Chest Port 1 View  Result Date: 12/19/2015 CLINICAL DATA:  Pneumonia. EXAM: PORTABLE CHEST 1 VIEW COMPARISON:  12/18/2015. FINDINGS: Endotracheal tube, NG tube, left IJ line stable position. Prior cardiac valve repair. Stable cardiomegaly. Progressive bibasilar atelectasis and infiltrates. Small right pleural effusion. No pneumothorax . IMPRESSION: 1. Lines and tubes in stable position. 2. Progressive bibasilar atelectasis and infiltrates. Small right pleural effusion. 3. No pneumothorax. Electronically Signed   By: Marcello Moores  Register   On: 12/19/2015 07:04   Dg Chest Port 1 View  Result Date: 12/18/2015 CLINICAL DATA:  Pneumonia, followup, intubation, central line, history cardiomyopathy, pulmonary embolism, hypertension, GERD EXAM: PORTABLE CHEST 1 VIEW COMPARISON:  Portable exam 0943 hours compared 12/17/2015 FINDINGS: Tip of endotracheal tube projects 5.9 cm above carina. Nasogastric tube extends into stomach. LEFT jugular central venous catheter tip projects over SVC near cavoatrial junction. External pacing leads project over chest. Enlargement of cardiac silhouette post MVR. Mediastinal contours  normal vascularity normal. Persistent RIGHT middle lobe consolidation consistent with pneumonia. Question developing LEFT lower lobe consolidation as well. Remaining lungs clear. No pleural effusion or pneumothorax. IMPRESSION: Persistent RIGHT middle lobe pneumonia with question developing infiltrate versus atelectasis in LEFT lower lobe. Line and tube positions as above. Electronically Signed   By: Lavonia Dana M.D.   On: 12/18/2015 11:13   Dg Chest Port 1 View  Result Date: 12/18/2015 CLINICAL DATA:  Central line placement.  Initial encounter. EXAM: PORTABLE CHEST 1 VIEW COMPARISON:  Chest radiograph performed earlier today at 10:32 p.m. FINDINGS: The patient's left IJ line is noted ending about the distal SVC. The patient's endotracheal tube is seen ending 2-3 cm above the carina. The right lower lobe airspace opacification is compatible with pneumonia, similar in appearance to  the prior study. No pleural effusion or pneumothorax is seen. The cardiomediastinal silhouette is mildly enlarged. The patient is status post median sternotomy. A valve replacement is noted. External pacing pads are seen. No acute osseous abnormalities are identified. IMPRESSION: 1. Left IJ line noted ending about the distal SVC. 2. Endotracheal tube seen ending 2-3 cm above the carina. 3. Right lower lobe pneumonia, similar in appearance to the recent prior study. 4. Mild cardiomegaly. Electronically Signed   By: Garald Balding M.D.   On: 12/18/2015 00:17   Dg Chest Port 1 View  Result Date: 12/17/2015 CLINICAL DATA:  CPR and intubation. EXAM: PORTABLE CHEST 1 VIEW COMPARISON:  12/17/2015. FINDINGS: Endotracheal tube is now identified with tip 2.3 cm above the carina. New dense consolidation/opacity throughout the medial right mid and lower lung noted. There is no evidence of pleural effusion or pneumothorax. Defibrillator pad overlying the lower chest noted. Cardiomegaly and cardiac valve replacement again identified. IMPRESSION:  Endotracheal tube with tip 2.3 cm above the carina. New dense consolidations/opacity within the medial right mid and lower lung. Electronically Signed   By: Margarette Canada M.D.   On: 12/17/2015 23:00   Dg Abdomen Acute W/chest  Result Date: 12/06/2015 CLINICAL DATA:  55 year old female with abdominal pain EXAM: DG ABDOMEN ACUTE W/ 1V CHEST COMPARISON:  Chest radiograph dated 10/23/2015 FINDINGS: The lungs are clear. No pleural effusion or pneumothorax. Stable mild cardiomegaly. Median sternotomy wires and CABG vascular clips. Mechanical cardiac valve. There is no bowel dilatation or evidence of obstruction. No free air or radiopaque calculi. Right upper quadrant cholecystectomy clips. There is degenerative changes of the spine and scoliosis. No acute fracture. IMPRESSION: No acute cardiopulmonary process. No bowel obstruction. Electronically Signed   By: Anner Crete M.D.   On: 12/06/2015 03:31   Echocardiogram: 12/19/2015 Study Conclusions  - Left ventricle: The cavity size was normal. Wall thickness was   normal. Systolic function was normal. The estimated ejection   fraction was in the range of 55% to 60%. Wall motion was normal;   there were no regional wall motion abnormalities. The study is   not technically sufficient to allow evaluation of LV diastolic   function. - Mitral valve: Prior procedures included surgical repair. There   was mild regurgitation. Valve area by pressure half-time: 2.35   cm^2. - Left atrium: The atrium was moderately dilated. - Tricuspid valve: There was moderate regurgitation. - Pulmonary arteries: Systolic pressure was moderately increased. - Pericardium, extracardiac: A small pericardial effusion was   identified.  Impressions:  - Normal LV systolic function; s/p MV repair with mild MR; moderate   LAE; moderate TR with moderately elevated pulmonary pressure.  Assessment & Plan    1. NSTEMI - patient presented 12/17/15 with nausea and vomiting in the  setting of consuming alcohol in excess. Labs on admission consistent with metabolic acidosis and lactic acidosis. Had a cardiac arrest on admission thought to be secondary to aspiration PNA as this occurred after taking a PO medication. Required CPR for 6 minutes and 1 dose of epinephrine with ROSC.  - cyclic troponin values obtained and have been 0.03, 0.10, 7.89, 13.88, and 21.83. EKG on admission showed sinus tachycardia, HR 137, with a new LBBB.  -  echocardiogram in 10/28/15 showed an EF of 55-60% with no wall motion abnormalities. Echo this admission shows preserved EF of 55-60%. - with her significantly elevated troponin, recent cardiac arrest, and new LBBB (now resolved), a cardiac catheterization is warranted once her current  clinical condition improves, however her chronic alcohol use and medication noncompliance is certainly a concern. Would not be a good DAPT candidate. Current anemia and thrombocytopenia inhibitory to this as well.  - continue ASA, statin (LFT's improved), and BB. These medications were reviewed with the patient.  2. ASD/ Mitral Valve Repair - s/p ASD repair in 1973, mitral valve repair in 2007 - Followed by Dr Marlou Porch  3. Aspiration PNA - currently on Unasyn - per admitting team  4. Metabolic Acidosis/ Lactic Acidosis - lactic acid 12.19 on admission, improved to 1.9. - per admitting team  5. Chronic Normocytic Anemia - Hgb variable from 9.1 - 15.0 this year. 13.7 on admission, 7.7 today - No evidence of active bleeding   6. Alcohol Abuse/ Medication Noncompliance - has reported consuming nothing but Vodka in the days leading up to her hospital admission. Not taking any medications. - concern for medication compliance going forward.   7. Hypokalema - K 3.7 on 8/14; supplement to 4.0 range. Recheck BMET.  8. Esophageal Dysmotility - continued follow up as an outpatient. - diet advanced to Dysphagia 3.  9. Thrombocytopenia - platelets improved to  110K on 8/16.  10. Abnormal LFTs - contributed by ETOH, hypotension. Improved. AST 56, ALT 28.   Cardiology Hospital Follow-up arranged for 8/29. Included in AVS. No further inpatient cardiac workup anticipated.  Arna Medici , PA-C 9:29 AM 12/24/2015 Pager: [phone]   Patient seen and examined. Agree with assessment and plan. No anginal symptoms. ERCG yestrday showed sinus tach at 104 without significant STT changes; QTC 460 msec.  Continues to have pancytopenia, although platelet count has significantly increased from 67K to 110K today. EF 55 - 60% without wall motion abnormalities. Tolerating metoprolol now at 75 mg bid with HR 80. No anginal chest pain but left sided chest wall discomfort.  She will ultimately need an ischemic evaluation.   Troy Sine, MD, [facility] 12/24/2015 2:43 PM

## 2015-12-24 NOTE — Progress Notes (Signed)
PROGRESS NOTE                                                                                                                                                                                                             Patient Demographics:    [name], is a 55 y.o. female, DOB - 1961/05/05, [id number]  Admit date - 12/17/2015   Admitting Physician Louellen Molder, MD  Outpatient Primary MD for the patient is Anthoney Harada, MD  LOS - 6  Outpatient Specialists: none  Chief Complaint  Patient presents with  . Nausea  . Emesis  . Shortness of Breath       Brief Narrative   55 year old female with history of alcohol abuse, major depression, hearing impairment presented to the ED with nausea and vomiting with epigastric pain, and gap metabolic acidosis/lactic acidosis. She had leukocytosis of 20k and was admitted to telemetry. During the evening she went into acute respiratory failure leading to cardiac arrest after choking on a pill. CPR was initiated with ROSC in 6 minutes. She was transferred to ICU intubated. She was found to have right lower lobe pneumonia. Also found to have NSTEMI. Patient placed on pressors and IV heparin. She was successfully extubated on 8/11 and  transferred to hospitalist service.    Subjective:   No overnight issues.   Assessment  & Plan :    Principal Problem: Acute hypoxic respiratory failure Likely secondary to aspiration pneumonia. Required intubation and ICU monitoring. Switch Unasyn to Augmentin if able to swallow pill. Stable on room air. Cultures negative.  Active Problems: Cardiac arrest with NSTEMI Likely contributed acute respiratory failure. IV heparin discontinued due to thrombocytopenia. Cardiology recommend outpatient workup and follow-up with Dr. Marlou Porch. Recommend in-hospital monitoring today for adequate blood pressure and heart rate control.  Continue  aspirin, metoprolol and Lipitor. 2-D echo shows EF of 55-60% with no wall motion abnormality.    Aspiration pneumonia Switch Unasyn to Augmentin to complete 7 day course of antibiotic.    Alcohol dependence ([place]) Counseled strongly on cessation. She wishes to quit. Social work consulted.  Anion gap metabolic acidosis/lactic acidosis Suspected due to heavy alcohol use with dehydration and vomiting. Resolved with hydration.  Dysphagia with history of esophageal paresis Dysphagia 3 diet    Anemia/thrombocytopenia Suspected in the setting of acute illness/alcohol abuse Off IV  heparin. Monitor. Trending down, repeat in am. Start ferrous sulfate     Major depression, chronic (HCC) Stable. Trazodone on hold due to prolonged QTC.     Acute kidney injury ([place]) Secondary to dehydration. Required aggressive hydration and now resolved.     Essential hypertension Adjust metoprolol dose.  Prn hydralazine   Hypokalemia replenished    Hx of MVR     Code Status :Full code  Family Communication  : none at bedside  Disposition Plan  :  Home possibly in a.m. if hb stable.   Barriers For Discharge :  Improving symptoms  Consults  :   PCCM cardiology  Procedures  :  ETT 8/09 >> 8/11 Lt IJ CVL 8/09 >> 2d echo  DVT Prophylaxis  :SCDs   Lab Results  Component Value Date   PLT 110 (L) 12/24/2015    Antibiotics  :    Anti-infectives    Start     Dose/Rate Route Frequency Ordered Stop   12/23/15 2200  amoxicillin-clavulanate (AUGMENTIN) 875-125 MG per tablet 1 tablet     1 tablet Oral Every 12 hours 12/23/15 1009     12/18/15 0000  Ampicillin-Sulbactam (UNASYN) 3 g in sodium chloride 0.9 % 100 mL IVPB  Status:  Discontinued     3 g 100 mL/hr over 60 Minutes Intravenous Every 6 hours 12/17/15 2352 12/23/15 1408        Objective:   Vitals:   12/23/15 1915 12/23/15 2036 12/24/15 0520 12/24/15 1500  BP:  132/85 (!) 133/92 135/80  Pulse:  90 93 90  Resp:  18 18 18     Temp: 98.9 F (37.2 C) 98.6 F (37 C) 97.9 F (36.6 C) 98.2 F (36.8 C)  TempSrc: Oral Oral Oral Oral  SpO2:  100% 100% 100%  Weight:   68 kg (150 lb)   Height:        Wt Readings from Last 3 Encounters:  12/24/15 68 kg (150 lb)  12/09/15 61.2 kg (135 lb)  12/06/15 62.1 kg (137 lb)     Intake/Output Summary (Last 24 hours) at 12/24/15 1733 Last data filed at 12/24/15 1300  Gross per 24 hour  Intake              500 ml  Output                0 ml  Net              500 ml     Physical Exam  Gen: not in distress HEENT: no pallor, moist mucosa, supple neck,  left IJ  Chest: clear b/l, no added sounds CVS: N S1&S2, no murmurs, rubs or gallop GI: soft, NT, ND, BS+ Musculoskeletal: warm, no edema     Data Review:    CBC  Recent Labs Lab 12/21/15 0400 12/22/15 0410 12/22/15 1600 12/23/15 0425 12/24/15 0445  WBC 4.1 3.0* 2.8* 2.3* 3.0*  HGB 8.4* 8.0* 8.6* 8.0* 7.7*  HCT 25.7* 24.5* 26.5* 24.6* 23.3*  PLT 70* 67* 82* 76* 110*  MCV 94.5 95.0 96.0 95.3 94.3  MCH 30.9 31.0 31.2 31.0 31.2  MCHC 32.7 32.7 32.5 32.5 33.0  RDW 16.7* 16.5* 16.7* 16.8* 17.1*  LYMPHSABS  --   --  0.6*  --   --   MONOABS  --   --  0.6  --   --   EOSABS  --   --  0.2  --   --   BASOSABS  --   --  0.0  --   --     Chemistries   Recent Labs Lab 12/17/15 2235 12/18/15 0443  12/18/15 1545  12/19/15 0450 12/19/15 0737 12/19/15 1610 12/20/15 0455 12/21/15 0400 12/22/15 0410  NA 141 135  < > 137  < > 141 141 144 145 138 139  K 6.0* 4.1  < > 2.7*  < > 2.7* 3.3* 3.1* 3.3* 3.3* 3.7  CL 113* 106  < > 103  < > 105 105 108 111 104 105  CO2 <7* 11*  < > 21*  < > 28 28 27 27 27 27   GLUCOSE 146* 347*  < > 284*  < > 110* 140* 99 90 94 84  BUN 15 20  < > 21*  < > 19 16 18 12 8 8   CREATININE 1.04* 1.68*  < > 1.61*  < > 1.03* 0.94 1.05* 0.93 0.75 0.90  CALCIUM 6.4* 6.4*  < > 6.7*  < > 7.1* 7.1* 7.2* 7.2* 7.7* 8.4*  MG 2.0 1.8  --  1.4*  --  2.3  --   --  1.7  --   --   AST  --   --   --    --   --   --   --   --  93* 56*  --   ALT  --   --   --   --   --   --   --   --  29 28  --   ALKPHOS  --   --   --   --   --   --   --   --  68 77  --   BILITOT  --   --   --   --   --   --   --   --  0.6 0.5  --   < > = values in this interval not displayed. ------------------------------------------------------------------------------------------------------------------ No results for input(s): CHOL, HDL, LDLCALC, TRIG, CHOLHDL, LDLDIRECT in the last 72 hours.  Lab Results  Component Value Date   HGBA1C 4.6 (L) 12/18/2015   ------------------------------------------------------------------------------------------------------------------ No results for input(s): TSH, T4TOTAL, T3FREE, THYROIDAB in the last 72 hours.  Invalid input(s): FREET3 ------------------------------------------------------------------------------------------------------------------ No results for input(s): VITAMINB12, FOLATE, FERRITIN, TIBC, IRON, RETICCTPCT in the last 72 hours.  Coagulation profile  Recent Labs Lab 12/19/15 1015  INR 1.25    No results for input(s): DDIMER in the last 72 hours.  Cardiac Enzymes  Recent Labs Lab 12/19/15 0737 12/19/15 1438 12/19/15 2038  TROPONINI 18.59* 9.89* 7.63*   ------------------------------------------------------------------------------------------------------------------ No results found for: BNP  Inpatient Medications  Scheduled Meds: . amoxicillin-clavulanate  1 tablet Oral Q12H  . antiseptic oral rinse  7 mL Mouth Rinse BID  . aspirin  81 mg Per Tube Daily  . atorvastatin  40 mg Oral q1800  . famotidine  20 mg Oral BID  . feeding supplement (GLUCERNA SHAKE)  237 mL Oral TID BM  . ferrous sulfate  325 mg Oral BID WC  . [START ON [id number] folic acid  1 mg Oral Daily  . lidocaine  1 patch Transdermal Q24H  . metoprolol tartrate  75 mg Oral BID  . multivitamin  15 mL Oral Daily  . sodium chloride flush  3 mL Intravenous Q12H  . [START ON  12/25/2015] thiamine  100 mg Oral Daily   Continuous Infusions:   PRN Meds:.acetaminophen **OR** acetaminophen, albuterol, diphenhydrAMINE, hydrALAZINE, HYDROcodone-acetaminophen, lip balm, morphine injection, nitroGLYCERIN, sodium chloride, sodium  chloride flush  Micro Results Recent Results (from the past 240 hour(s))  Culture, blood (routine x 2)     Status: None   Collection Time: 12/17/15  6:49 PM  Result Value Ref Range Status   Specimen Description BLOOD LEFT HAND  Final   Special Requests IN PEDIATRIC BOTTLE [place]  Final   Culture   Final    NO GROWTH 5 DAYS Performed at [facility]    Report Status 12/22/2015 FINAL  Final  Culture, blood (routine x 2)     Status: None   Collection Time: 12/17/15  6:49 PM  Result Value Ref Range Status   Specimen Description BLOOD LEFT HAND  Final   Special Requests BOTTLES DRAWN AEROBIC ONLY 5CC  Final   Culture   Final    NO GROWTH 5 DAYS Performed at [facility]    Report Status 12/22/2015 FINAL  Final  MRSA PCR Screening     Status: Abnormal   Collection Time: 12/17/15  9:42 PM  Result Value Ref Range Status   MRSA by PCR POSITIVE (A) NEGATIVE Final    Comment:        The GeneXpert MRSA Assay (FDA approved for NASAL specimens only), is one component of a comprehensive MRSA colonization surveillance program. It is not intended to diagnose MRSA infection nor to guide or monitor treatment for MRSA infections. RESULT CALLED TO, READ BACK BY AND VERIFIED WITH: M.BARHAM,RN AT 0045 12/18/15 BY W.SHEA   Culture, respiratory (NON-Expectorated)     Status: None   Collection Time: 12/17/15 11:32 PM  Result Value Ref Range Status   Specimen Description TRACHEAL ASPIRATE  Final   Special Requests Normal  Final   Gram Stain   Final    MODERATE WBC PRESENT, PREDOMINANTLY PMN FEW GRAM POSITIVE COCCI IN CLUSTERS    Culture   Final    Consistent with normal respiratory flora. Performed at [facility]    Report  Status 12/20/2015 FINAL  Final  C difficile quick scan w PCR reflex     Status: None   Collection Time: 12/19/15  6:53 PM  Result Value Ref Range Status   C Diff antigen NEGATIVE NEGATIVE Final   C Diff toxin NEGATIVE NEGATIVE Final   C Diff interpretation No C. difficile detected.  Final    Radiology Reports Ct Abdomen Pelvis Wo Contrast  Result Date: 12/06/2015 CLINICAL DATA:  56 year old female with abdominal pain EXAM: CT ABDOMEN AND PELVIS WITHOUT CONTRAST TECHNIQUE: Multidetector CT imaging of the abdomen and pelvis was performed following the standard protocol without IV contrast. COMPARISON:  Abdominal CT dated 10/06/2015 and ultrasound dated 10/21/2015 FINDINGS: Evaluation of this exam is limited in the absence of intravenous contrast. Evaluation is also limited due to respiratory motion artifact. There is cardiomegaly. Mechanical mitral valve. There is hypoattenuation of the cardiac blood pool suggestive of a degree of anemia. Clinical correlation is recommended. There is slight prominence of the pulmonary vasculature is and the visualized lung bases which may represent mild congestive changes. No intra-abdominal free air or free fluid. Cholecystectomy. Fatty liver. The pancreas, spleen, adrenal glands, kidneys, visualized ureters, and urinary bladder appear unremarkable. The uterus is anteverted and grossly unremarkable. There is sigmoid diverticulosis without active inflammatory changes. There is a small hiatal hernia. There is no evidence of bowel obstruction or active inflammation. There is mild aortoiliac atherosclerotic disease. The abdominal aorta and IVC are grossly unremarkable on this noncontrast study. No portal venous gas identified. There is  no adenopathy. There is a small fat containing umbilical hernia. The abdominal wall soft tissues are otherwise unremarkable. There is degenerative changes of the spine. Scoliosis. Multilevel endplate irregularity and disc space narrowing. There  is a chronic appearing 5 mm retropulsed fragment from the posterior aspect of the superior endplate of the L1 vertebrae. No acute fracture. IMPRESSION: No acute intra-abdominal pelvic pathology. Diverticulosis. Fatty liver. Electronically Signed   By: Anner Crete M.D.   On: 12/06/2015 06:44  Dg Chest 2 View  Result Date: 12/17/2015 CLINICAL DATA:  Shortness of breath, nausea and vomiting beginning this morning. EXAM: CHEST  2 VIEW COMPARISON:  PA and lateral chest 07/17/2015. FINDINGS: The lungs are clear. Heart size is normal. The patient is status post mitral valve repair. No pneumothorax or pleural effusion. Remote T12 compression fracture is seen. IMPRESSION: No acute disease. Electronically Signed   By: Inge Rise M.D.   On: 12/17/2015 15:23   Dg Forearm Right  Result Date: 12/04/2015 CLINICAL DATA:  Fall today with right distal forearm and wrist pain with swelling and bruising. EXAM: RIGHT FOREARM - 2 VIEW COMPARISON:  None. FINDINGS: Comminuted fracture the distal radial metaphysis extends to the articular surface apex anterior angulation noted at fracture site. No associated ulnar fracture is identified. IMPRESSION: Comminuted distal radius fracture with intra-articular extension. Electronically Signed   By: Misty Stanley M.D.   On: 12/04/2015 19:16  Dg Wrist Complete Right  Result Date: 12/04/2015 CLINICAL DATA:  Fall earlier today with wrist pain and swelling. EXAM: RIGHT WRIST - COMPLETE 3+ VIEW COMPARISON:  None. FINDINGS: Comminuted fracture of the distal radius identified with intra-articular extension. No associated fracture the distal ulna. Carpal alignment is anatomic. There are some degenerative changes at the first carpometacarpal joint and also at the first MCP joint. IMPRESSION: Comminuted distal radius fracture with intra-articular extension. Electronically Signed   By: Misty Stanley M.D.   On: 12/04/2015 19:17  Dg Abd 1 View  Result Date: 12/18/2015 CLINICAL DATA:  OG  tube placement. EXAM: ABDOMEN - 1 VIEW COMPARISON:  12/06/2015 FINDINGS: An OG tube is identified with tip overlying the distal stomach. The bowel gas pattern is unremarkable. Defibrillator pads overlying the lower chest noted. IMPRESSION: OG tube with tip overlying the distal stomach. Electronically Signed   By: Margarette Canada M.D.   On: 12/18/2015 01:15   Dg Chest Port 1 View  Result Date: 12/20/2015 CLINICAL DATA:  Patient with history of pneumonia and cardiomyopathy. EXAM: PORTABLE CHEST 1 VIEW COMPARISON:  Chest radiograph 12/19/2015 FINDINGS: Interval extubation and removal of enteric tube. Left IJ central venous catheter tip projects over the superior vena cava. Monitoring leads overlie the patient. Stable cardiomegaly. Interval worsening of airspace consolidation within the right mid and lower lung. Probable tiny bilateral pleural effusions. No pneumothorax. IMPRESSION: Interval increase in right mid and lower lung consolidation. Probable small bilateral pleural effusions. Interval extubation. Electronically Signed   By: Lovey Newcomer M.D.   On: 12/20/2015 07:17   Dg Chest Port 1 View  Result Date: 12/19/2015 CLINICAL DATA:  Pneumonia. EXAM: PORTABLE CHEST 1 VIEW COMPARISON:  12/18/2015. FINDINGS: Endotracheal tube, NG tube, left IJ line stable position. Prior cardiac valve repair. Stable cardiomegaly. Progressive bibasilar atelectasis and infiltrates. Small right pleural effusion. No pneumothorax . IMPRESSION: 1. Lines and tubes in stable position. 2. Progressive bibasilar atelectasis and infiltrates. Small right pleural effusion. 3. No pneumothorax. Electronically Signed   By: Marcello Moores  Register   On: 12/19/2015 07:04   Dg Chest  Port 1 View  Result Date: 12/18/2015 CLINICAL DATA:  Pneumonia, followup, intubation, central line, history cardiomyopathy, pulmonary embolism, hypertension, GERD EXAM: PORTABLE CHEST 1 VIEW COMPARISON:  Portable exam 0943 hours compared 12/17/2015 FINDINGS: Tip of  endotracheal tube projects 5.9 cm above carina. Nasogastric tube extends into stomach. LEFT jugular central venous catheter tip projects over SVC near cavoatrial junction. External pacing leads project over chest. Enlargement of cardiac silhouette post MVR. Mediastinal contours normal vascularity normal. Persistent RIGHT middle lobe consolidation consistent with pneumonia. Question developing LEFT lower lobe consolidation as well. Remaining lungs clear. No pleural effusion or pneumothorax. IMPRESSION: Persistent RIGHT middle lobe pneumonia with question developing infiltrate versus atelectasis in LEFT lower lobe. Line and tube positions as above. Electronically Signed   By: Lavonia Dana M.D.   On: 12/18/2015 11:13   Dg Chest Port 1 View  Result Date: 12/18/2015 CLINICAL DATA:  Central line placement.  Initial encounter. EXAM: PORTABLE CHEST 1 VIEW COMPARISON:  Chest radiograph performed earlier today at 10:32 p.m. FINDINGS: The patient's left IJ line is noted ending about the distal SVC. The patient's endotracheal tube is seen ending 2-3 cm above the carina. The right lower lobe airspace opacification is compatible with pneumonia, similar in appearance to the prior study. No pleural effusion or pneumothorax is seen. The cardiomediastinal silhouette is mildly enlarged. The patient is status post median sternotomy. A valve replacement is noted. External pacing pads are seen. No acute osseous abnormalities are identified. IMPRESSION: 1. Left IJ line noted ending about the distal SVC. 2. Endotracheal tube seen ending 2-3 cm above the carina. 3. Right lower lobe pneumonia, similar in appearance to the recent prior study. 4. Mild cardiomegaly. Electronically Signed   By: Garald Balding M.D.   On: 12/18/2015 00:17   Dg Chest Port 1 View  Result Date: 12/17/2015 CLINICAL DATA:  CPR and intubation. EXAM: PORTABLE CHEST 1 VIEW COMPARISON:  12/17/2015. FINDINGS: Endotracheal tube is now identified with tip 2.3 cm above  the carina. New dense consolidation/opacity throughout the medial right mid and lower lung noted. There is no evidence of pleural effusion or pneumothorax. Defibrillator pad overlying the lower chest noted. Cardiomegaly and cardiac valve replacement again identified. IMPRESSION: Endotracheal tube with tip 2.3 cm above the carina. New dense consolidations/opacity within the medial right mid and lower lung. Electronically Signed   By: Margarette Canada M.D.   On: 12/17/2015 23:00   Dg Abdomen Acute W/chest  Result Date: 12/06/2015 CLINICAL DATA:  55 year old female with abdominal pain EXAM: DG ABDOMEN ACUTE W/ 1V CHEST COMPARISON:  Chest radiograph dated 10/23/2015 FINDINGS: The lungs are clear. No pleural effusion or pneumothorax. Stable mild cardiomegaly. Median sternotomy wires and CABG vascular clips. Mechanical cardiac valve. There is no bowel dilatation or evidence of obstruction. No free air or radiopaque calculi. Right upper quadrant cholecystectomy clips. There is degenerative changes of the spine and scoliosis. No acute fracture. IMPRESSION: No acute cardiopulmonary process. No bowel obstruction. Electronically Signed   By: Anner Crete M.D.   On: 12/06/2015 03:31   Time Spent in minutes  25   Wenceslaus Gist A M.D on 12/24/2015 at 5:33 PM  Between 7am to 7pm - Pager - [phone]  After 7pm go to [email or web address] - password [facility]  [facility] -  Office  [phone]

## 2015-12-24 NOTE — Plan of Care (Signed)
Problem: Education: Goal: Knowledge of [place] [facility] Education information/materials will improve Outcome: Progressing Educated patient about safety and calling to get assistance before getting up. Discussed about medications and side effects.

## 2015-12-25 LAB — HEMOGLOBIN AND HEMATOCRIT, BLOOD
HEMATOCRIT: 23.3 % — AB (ref 36.0–46.0)
HEMOGLOBIN: 7.8 g/dL — AB (ref 12.0–15.0)

## 2015-12-25 LAB — ABO/RH: ABO/RH(D): O POS

## 2015-12-25 LAB — BASIC METABOLIC PANEL
Anion gap: 7 (ref 5–15)
BUN: 7 mg/dL (ref 6–20)
CALCIUM: 7.8 mg/dL — AB (ref 8.9–10.3)
CHLORIDE: 104 mmol/L (ref 101–111)
CO2: 28 mmol/L (ref 22–32)
CREATININE: 0.9 mg/dL (ref 0.44–1.00)
Glucose, Bld: 79 mg/dL (ref 65–99)
Potassium: 3.2 mmol/L — ABNORMAL LOW (ref 3.5–5.1)
SODIUM: 139 mmol/L (ref 135–145)

## 2015-12-25 LAB — PREPARE RBC (CROSSMATCH)

## 2015-12-25 MED ORDER — POTASSIUM CHLORIDE CRYS ER 20 MEQ PO TBCR
40.0000 meq | EXTENDED_RELEASE_TABLET | Freq: Once | ORAL | Status: AC
  Administered 2015-12-25: 40 meq via ORAL
  Filled 2015-12-25: qty 2

## 2015-12-25 MED ORDER — SODIUM CHLORIDE 0.9 % IV SOLN
Freq: Once | INTRAVENOUS | Status: AC
  Administered 2015-12-25: 09:00:00 via INTRAVENOUS

## 2015-12-25 MED ORDER — FUROSEMIDE 10 MG/ML IJ SOLN
20.0000 mg | Freq: Once | INTRAMUSCULAR | Status: AC
  Administered 2015-12-25: 20 mg via INTRAVENOUS
  Filled 2015-12-25: qty 2

## 2015-12-25 MED ORDER — ONDANSETRON 4 MG PO TBDP
4.0000 mg | ORAL_TABLET | Freq: Once | ORAL | Status: AC
  Administered 2015-12-25: 4 mg via ORAL
  Filled 2015-12-25: qty 1

## 2015-12-25 NOTE — Progress Notes (Signed)
PT Cancellation Note  Patient Details Name: [name] MRN: [id number] DOB: Sep 26, 1960   Cancelled Treatment:     receiving blood transfusion this am.  Will check back as schedule permits.     Rica Koyanagi  PTA [facility]  Acute  Rehab Pager      [phone]

## 2015-12-25 NOTE — Progress Notes (Signed)
PROGRESS NOTE                                                                                                                                                                                                             Patient Demographics:    [name], is a 55 y.o. female, DOB - Jan 05, 1961, [id number]  Admit date - 12/17/2015   Admitting Physician Louellen Molder, MD  Outpatient Primary MD for the patient is Anthoney Harada, MD  LOS - 7  Outpatient Specialists: none  Chief Complaint  Patient presents with  . Nausea  . Emesis  . Shortness of Breath       Brief Narrative   55 year old female with history of alcohol abuse, major depression, hearing impairment presented to the ED with nausea and vomiting with epigastric pain, and gap metabolic acidosis/lactic acidosis. She had leukocytosis of 20k and was admitted to telemetry. During the evening she went into acute respiratory failure leading to cardiac arrest after choking on a pill. CPR was initiated with ROSC in 6 minutes. She was transferred to ICU intubated. She was found to have right lower lobe pneumonia. Also found to have NSTEMI. Patient placed on pressors and IV heparin. She was successfully extubated on 8/11 and  transferred to hospitalist service.    Subjective:   No overnight issues.   Assessment  & Plan :    Principal Problem: Acute hypoxic respiratory failure Likely secondary to aspiration pneumonia. Required intubation and ICU monitoring. Switch Unasyn to Augmentin if able to swallow pill. Stable on room air. Cultures negative.  Active Problems: Cardiac arrest with NSTEMI Likely contributed acute respiratory failure. IV heparin discontinued due to thrombocytopenia. Cardiology recommend outpatient workup and follow-up with Dr. Marlou Porch. Recommend in-hospital monitoring today for adequate blood pressure and heart rate control.  Continue  aspirin, metoprolol and Lipitor. 2-D echo shows EF of 55-60% with no wall motion abnormality.    Aspiration pneumonia Switch Unasyn to Augmentin to complete 7 day course of antibiotic.    Alcohol dependence ([place]) Counseled strongly on cessation. She wishes to quit. Social work consulted.  Anion gap metabolic acidosis/lactic acidosis Suspected due to heavy alcohol use with dehydration and vomiting. Resolved with hydration.  Dysphagia with history of esophageal paresis Dysphagia 3 diet    Anemia/thrombocytopenia Suspected in the setting of acute illness/alcohol abuse Off IV  heparin. Monitor. Trending down, repeat in am. Started  ferrous sulfate  Will transfuse one unit due to recent cardiac arrest, and unknown if CAD>     Major depression, chronic (HCC) Stable. Trazodone on hold due to prolonged QTC.     Acute kidney injury ([place]) Secondary to dehydration. Required aggressive hydration and now resolved.     Essential hypertension On metoprolol. Prn hydralazine   Hypokalemia replenished    Hx of MVR     Code Status :Full code  Family Communication  : none at bedside  Disposition Plan  :  Home possibly in a.m. if hb stable.   Barriers For Discharge :  Improving symptoms  Consults  :   PCCM cardiology  Procedures  :  ETT 8/09 >> 8/11 Lt IJ CVL 8/09 >> 2d echo  DVT Prophylaxis  :SCDs   Lab Results  Component Value Date   PLT 110 (L) 12/24/2015    Antibiotics  :    Anti-infectives    Start     Dose/Rate Route Frequency Ordered Stop   12/23/15 2200  amoxicillin-clavulanate (AUGMENTIN) 875-125 MG per tablet 1 tablet     1 tablet Oral Every 12 hours 12/23/15 1009     12/18/15 0000  Ampicillin-Sulbactam (UNASYN) 3 g in sodium chloride 0.9 % 100 mL IVPB  Status:  Discontinued     3 g 100 mL/hr over 60 Minutes Intravenous Every 6 hours 12/17/15 2352 12/23/15 1408        Objective:   Vitals:   12/24/15 2058 12/25/15 0644 12/25/15 1130 12/25/15 1155    BP: (!) 152/98 (!) 136/92 (!) 148/90 135/87  Pulse: 88 77 80 75  Resp: 18 18 18 18   Temp: 98.8 F (37.1 C) 98.7 F (37.1 C) 98.6 F (37 C) 98.5 F (36.9 C)  TempSrc: Oral Oral Oral Oral  SpO2: 98% 99% 99% 99%  Weight:  68.5 kg (151 lb)    Height:        Wt Readings from Last 3 Encounters:  12/25/15 68.5 kg (151 lb)  12/09/15 61.2 kg (135 lb)  12/06/15 62.1 kg (137 lb)     Intake/Output Summary (Last 24 hours) at 12/25/15 1428 Last data filed at 12/25/15 1140  Gross per 24 hour  Intake              770 ml  Output                0 ml  Net              770 ml     Physical Exam  Gen: not in distress HEENT: no pallor, moist mucosa, supple neck,  left IJ  Chest: clear b/l, no added sounds CVS: N S1&S2, no murmurs, rubs or gallop GI: soft, NT, ND, BS+ Musculoskeletal: warm, no edema     Data Review:    CBC  Recent Labs Lab 12/21/15 0400 12/22/15 0410 12/22/15 1600 12/23/15 0425 12/24/15 0445 12/25/15 0445  WBC 4.1 3.0* 2.8* 2.3* 3.0*  --   HGB 8.4* 8.0* 8.6* 8.0* 7.7* 7.8*  HCT 25.7* 24.5* 26.5* 24.6* 23.3* 23.3*  PLT 70* 67* 82* 76* 110*  --   MCV 94.5 95.0 96.0 95.3 94.3  --   MCH 30.9 31.0 31.2 31.0 31.2  --   MCHC 32.7 32.7 32.5 32.5 33.0  --   RDW 16.7* 16.5* 16.7* 16.8* 17.1*  --   LYMPHSABS  --   --  0.6*  --   --   --  MONOABS  --   --  0.6  --   --   --   EOSABS  --   --  0.2  --   --   --   BASOSABS  --   --  0.0  --   --   --     Chemistries   Recent Labs Lab 12/18/15 1545  12/19/15 0450  12/19/15 1610 12/20/15 0455 12/21/15 0400 12/22/15 0410 12/25/15 0445  NA 137  < > 141  < > 144 145 138 139 139  K 2.7*  < > 2.7*  < > 3.1* 3.3* 3.3* 3.7 3.2*  CL 103  < > 105  < > 108 111 104 105 104  CO2 21*  < > 28  < > 27 27 27 27 28   GLUCOSE 284*  < > 110*  < > 99 90 94 84 79  BUN 21*  < > 19  < > 18 12 8 8 7   CREATININE 1.61*  < > 1.03*  < > 1.05* 0.93 0.75 0.90 0.90  CALCIUM 6.7*  < > 7.1*  < > 7.2* 7.2* 7.7* 8.4* 7.8*  MG 1.4*  --   2.3  --   --  1.7  --   --   --   AST  --   --   --   --   --  93* 56*  --   --   ALT  --   --   --   --   --  29 28  --   --   ALKPHOS  --   --   --   --   --  68 77  --   --   BILITOT  --   --   --   --   --  0.6 0.5  --   --   < > = values in this interval not displayed. ------------------------------------------------------------------------------------------------------------------ No results for input(s): CHOL, HDL, LDLCALC, TRIG, CHOLHDL, LDLDIRECT in the last 72 hours.  Lab Results  Component Value Date   HGBA1C 4.6 (L) 12/18/2015   ------------------------------------------------------------------------------------------------------------------ No results for input(s): TSH, T4TOTAL, T3FREE, THYROIDAB in the last 72 hours.  Invalid input(s): FREET3 ------------------------------------------------------------------------------------------------------------------ No results for input(s): VITAMINB12, FOLATE, FERRITIN, TIBC, IRON, RETICCTPCT in the last 72 hours.  Coagulation profile  Recent Labs Lab 12/19/15 1015  INR 1.25    No results for input(s): DDIMER in the last 72 hours.  Cardiac Enzymes  Recent Labs Lab 12/19/15 0737 12/19/15 1438 12/19/15 2038  TROPONINI 18.59* 9.89* 7.63*   ------------------------------------------------------------------------------------------------------------------ No results found for: BNP  Inpatient Medications  Scheduled Meds: . amoxicillin-clavulanate  1 tablet Oral Q12H  . antiseptic oral rinse  7 mL Mouth Rinse BID  . aspirin  81 mg Per Tube Daily  . atorvastatin  40 mg Oral q1800  . famotidine  20 mg Oral BID  . feeding supplement (GLUCERNA SHAKE)  237 mL Oral TID BM  . ferrous sulfate  325 mg Oral BID WC  . folic acid  1 mg Oral Daily  . furosemide  20 mg Intravenous Once  . lidocaine  1 patch Transdermal Q24H  . metoprolol tartrate  75 mg Oral BID  . multivitamin  15 mL Oral Daily  . sodium chloride flush  3 mL  Intravenous Q12H  . thiamine  100 mg Oral Daily   Continuous Infusions:   PRN Meds:.acetaminophen **OR** acetaminophen, albuterol, diphenhydrAMINE, hydrALAZINE, HYDROcodone-acetaminophen, lip balm, morphine injection, nitroGLYCERIN, sodium  chloride, sodium chloride flush  Micro Results Recent Results (from the past 240 hour(s))  Culture, blood (routine x 2)     Status: None   Collection Time: 12/17/15  6:49 PM  Result Value Ref Range Status   Specimen Description BLOOD LEFT HAND  Final   Special Requests IN PEDIATRIC BOTTLE [place]  Final   Culture   Final    NO GROWTH 5 DAYS Performed at [facility]    Report Status 12/22/2015 FINAL  Final  Culture, blood (routine x 2)     Status: None   Collection Time: 12/17/15  6:49 PM  Result Value Ref Range Status   Specimen Description BLOOD LEFT HAND  Final   Special Requests BOTTLES DRAWN AEROBIC ONLY 5CC  Final   Culture   Final    NO GROWTH 5 DAYS Performed at [facility]    Report Status 12/22/2015 FINAL  Final  MRSA PCR Screening     Status: Abnormal   Collection Time: 12/17/15  9:42 PM  Result Value Ref Range Status   MRSA by PCR POSITIVE (A) NEGATIVE Final    Comment:        The GeneXpert MRSA Assay (FDA approved for NASAL specimens only), is one component of a comprehensive MRSA colonization surveillance program. It is not intended to diagnose MRSA infection nor to guide or monitor treatment for MRSA infections. RESULT CALLED TO, READ BACK BY AND VERIFIED WITH: M.BARHAM,RN AT 0045 12/18/15 BY W.SHEA   Culture, respiratory (NON-Expectorated)     Status: None   Collection Time: 12/17/15 11:32 PM  Result Value Ref Range Status   Specimen Description TRACHEAL ASPIRATE  Final   Special Requests Normal  Final   Gram Stain   Final    MODERATE WBC PRESENT, PREDOMINANTLY PMN FEW GRAM POSITIVE COCCI IN CLUSTERS    Culture   Final    Consistent with normal respiratory flora. Performed at [facility]      Report Status 12/20/2015 FINAL  Final  C difficile quick scan w PCR reflex     Status: None   Collection Time: 12/19/15  6:53 PM  Result Value Ref Range Status   C Diff antigen NEGATIVE NEGATIVE Final   C Diff toxin NEGATIVE NEGATIVE Final   C Diff interpretation No C. difficile detected.  Final    Radiology Reports Ct Abdomen Pelvis Wo Contrast  Result Date: 12/06/2015 CLINICAL DATA:  55 year old female with abdominal pain EXAM: CT ABDOMEN AND PELVIS WITHOUT CONTRAST TECHNIQUE: Multidetector CT imaging of the abdomen and pelvis was performed following the standard protocol without IV contrast. COMPARISON:  Abdominal CT dated 10/06/2015 and ultrasound dated 10/21/2015 FINDINGS: Evaluation of this exam is limited in the absence of intravenous contrast. Evaluation is also limited due to respiratory motion artifact. There is cardiomegaly. Mechanical mitral valve. There is hypoattenuation of the cardiac blood pool suggestive of a degree of anemia. Clinical correlation is recommended. There is slight prominence of the pulmonary vasculature is and the visualized lung bases which may represent mild congestive changes. No intra-abdominal free air or free fluid. Cholecystectomy. Fatty liver. The pancreas, spleen, adrenal glands, kidneys, visualized ureters, and urinary bladder appear unremarkable. The uterus is anteverted and grossly unremarkable. There is sigmoid diverticulosis without active inflammatory changes. There is a small hiatal hernia. There is no evidence of bowel obstruction or active inflammation. There is mild aortoiliac atherosclerotic disease. The abdominal aorta and IVC are grossly unremarkable on this noncontrast study. No portal venous gas  identified. There is no adenopathy. There is a small fat containing umbilical hernia. The abdominal wall soft tissues are otherwise unremarkable. There is degenerative changes of the spine. Scoliosis. Multilevel endplate irregularity and disc space  narrowing. There is a chronic appearing 5 mm retropulsed fragment from the posterior aspect of the superior endplate of the L1 vertebrae. No acute fracture. IMPRESSION: No acute intra-abdominal pelvic pathology. Diverticulosis. Fatty liver. Electronically Signed   By: Anner Crete M.D.   On: 12/06/2015 06:44  Dg Chest 2 View  Result Date: 12/17/2015 CLINICAL DATA:  Shortness of breath, nausea and vomiting beginning this morning. EXAM: CHEST  2 VIEW COMPARISON:  PA and lateral chest 07/17/2015. FINDINGS: The lungs are clear. Heart size is normal. The patient is status post mitral valve repair. No pneumothorax or pleural effusion. Remote T12 compression fracture is seen. IMPRESSION: No acute disease. Electronically Signed   By: Inge Rise M.D.   On: 12/17/2015 15:23   Dg Forearm Right  Result Date: 12/04/2015 CLINICAL DATA:  Fall today with right distal forearm and wrist pain with swelling and bruising. EXAM: RIGHT FOREARM - 2 VIEW COMPARISON:  None. FINDINGS: Comminuted fracture the distal radial metaphysis extends to the articular surface apex anterior angulation noted at fracture site. No associated ulnar fracture is identified. IMPRESSION: Comminuted distal radius fracture with intra-articular extension. Electronically Signed   By: Misty Stanley M.D.   On: 12/04/2015 19:16  Dg Wrist Complete Right  Result Date: 12/04/2015 CLINICAL DATA:  Fall earlier today with wrist pain and swelling. EXAM: RIGHT WRIST - COMPLETE 3+ VIEW COMPARISON:  None. FINDINGS: Comminuted fracture of the distal radius identified with intra-articular extension. No associated fracture the distal ulna. Carpal alignment is anatomic. There are some degenerative changes at the first carpometacarpal joint and also at the first MCP joint. IMPRESSION: Comminuted distal radius fracture with intra-articular extension. Electronically Signed   By: Misty Stanley M.D.   On: 12/04/2015 19:17  Dg Abd 1 View  Result Date:  12/18/2015 CLINICAL DATA:  OG tube placement. EXAM: ABDOMEN - 1 VIEW COMPARISON:  12/06/2015 FINDINGS: An OG tube is identified with tip overlying the distal stomach. The bowel gas pattern is unremarkable. Defibrillator pads overlying the lower chest noted. IMPRESSION: OG tube with tip overlying the distal stomach. Electronically Signed   By: Margarette Canada M.D.   On: 12/18/2015 01:15   Dg Chest Port 1 View  Result Date: 12/20/2015 CLINICAL DATA:  Patient with history of pneumonia and cardiomyopathy. EXAM: PORTABLE CHEST 1 VIEW COMPARISON:  Chest radiograph 12/19/2015 FINDINGS: Interval extubation and removal of enteric tube. Left IJ central venous catheter tip projects over the superior vena cava. Monitoring leads overlie the patient. Stable cardiomegaly. Interval worsening of airspace consolidation within the right mid and lower lung. Probable tiny bilateral pleural effusions. No pneumothorax. IMPRESSION: Interval increase in right mid and lower lung consolidation. Probable small bilateral pleural effusions. Interval extubation. Electronically Signed   By: Lovey Newcomer M.D.   On: 12/20/2015 07:17   Dg Chest Port 1 View  Result Date: 12/19/2015 CLINICAL DATA:  Pneumonia. EXAM: PORTABLE CHEST 1 VIEW COMPARISON:  12/18/2015. FINDINGS: Endotracheal tube, NG tube, left IJ line stable position. Prior cardiac valve repair. Stable cardiomegaly. Progressive bibasilar atelectasis and infiltrates. Small right pleural effusion. No pneumothorax . IMPRESSION: 1. Lines and tubes in stable position. 2. Progressive bibasilar atelectasis and infiltrates. Small right pleural effusion. 3. No pneumothorax. Electronically Signed   By: Marcello Moores  Register   On: 12/19/2015 07:04  Dg Chest Port 1 View  Result Date: 12/18/2015 CLINICAL DATA:  Pneumonia, followup, intubation, central line, history cardiomyopathy, pulmonary embolism, hypertension, GERD EXAM: PORTABLE CHEST 1 VIEW COMPARISON:  Portable exam 0943 hours compared  12/17/2015 FINDINGS: Tip of endotracheal tube projects 5.9 cm above carina. Nasogastric tube extends into stomach. LEFT jugular central venous catheter tip projects over SVC near cavoatrial junction. External pacing leads project over chest. Enlargement of cardiac silhouette post MVR. Mediastinal contours normal vascularity normal. Persistent RIGHT middle lobe consolidation consistent with pneumonia. Question developing LEFT lower lobe consolidation as well. Remaining lungs clear. No pleural effusion or pneumothorax. IMPRESSION: Persistent RIGHT middle lobe pneumonia with question developing infiltrate versus atelectasis in LEFT lower lobe. Line and tube positions as above. Electronically Signed   By: Lavonia Dana M.D.   On: 12/18/2015 11:13   Dg Chest Port 1 View  Result Date: 12/18/2015 CLINICAL DATA:  Central line placement.  Initial encounter. EXAM: PORTABLE CHEST 1 VIEW COMPARISON:  Chest radiograph performed earlier today at 10:32 p.m. FINDINGS: The patient's left IJ line is noted ending about the distal SVC. The patient's endotracheal tube is seen ending 2-3 cm above the carina. The right lower lobe airspace opacification is compatible with pneumonia, similar in appearance to the prior study. No pleural effusion or pneumothorax is seen. The cardiomediastinal silhouette is mildly enlarged. The patient is status post median sternotomy. A valve replacement is noted. External pacing pads are seen. No acute osseous abnormalities are identified. IMPRESSION: 1. Left IJ line noted ending about the distal SVC. 2. Endotracheal tube seen ending 2-3 cm above the carina. 3. Right lower lobe pneumonia, similar in appearance to the recent prior study. 4. Mild cardiomegaly. Electronically Signed   By: Garald Balding M.D.   On: 12/18/2015 00:17   Dg Chest Port 1 View  Result Date: 12/17/2015 CLINICAL DATA:  CPR and intubation. EXAM: PORTABLE CHEST 1 VIEW COMPARISON:  12/17/2015. FINDINGS: Endotracheal tube is now  identified with tip 2.3 cm above the carina. New dense consolidation/opacity throughout the medial right mid and lower lung noted. There is no evidence of pleural effusion or pneumothorax. Defibrillator pad overlying the lower chest noted. Cardiomegaly and cardiac valve replacement again identified. IMPRESSION: Endotracheal tube with tip 2.3 cm above the carina. New dense consolidations/opacity within the medial right mid and lower lung. Electronically Signed   By: Margarette Canada M.D.   On: 12/17/2015 23:00   Dg Abdomen Acute W/chest  Result Date: 12/06/2015 CLINICAL DATA:  55 year old female with abdominal pain EXAM: DG ABDOMEN ACUTE W/ 1V CHEST COMPARISON:  Chest radiograph dated 10/23/2015 FINDINGS: The lungs are clear. No pleural effusion or pneumothorax. Stable mild cardiomegaly. Median sternotomy wires and CABG vascular clips. Mechanical cardiac valve. There is no bowel dilatation or evidence of obstruction. No free air or radiopaque calculi. Right upper quadrant cholecystectomy clips. There is degenerative changes of the spine and scoliosis. No acute fracture. IMPRESSION: No acute cardiopulmonary process. No bowel obstruction. Electronically Signed   By: Anner Crete M.D.   On: 12/06/2015 03:31   Time Spent in minutes  25   Kesley Mullens A M.D on 12/25/2015 at 2:28 PM  Between 7am to 7pm - Pager - [phone]  After 7pm go to [email or web address] - password [facility]  [facility] -  Office  [phone]

## 2015-12-25 NOTE — Progress Notes (Signed)
    Patient's HR and BP remain stable. Continue ASA, statin, and Lopressor at time of discharge. Outpatient Cardiology follow-up arranged for 01/06/2016. Please call with questions.  Signed, Erma Heritage, PA-C 12/25/2015, 9:08 AM Pager: [phone]

## 2015-12-25 NOTE — Progress Notes (Signed)
Pt selected [facility] for [facility] needs.

## 2015-12-26 LAB — TYPE AND SCREEN
ABO/RH(D): O POS
Antibody Screen: NEGATIVE
Unit division: 0

## 2015-12-26 LAB — HEMOGLOBIN AND HEMATOCRIT, BLOOD
HEMATOCRIT: 28.6 % — AB (ref 36.0–46.0)
HEMOGLOBIN: 9.7 g/dL — AB (ref 12.0–15.0)

## 2015-12-26 MED ORDER — NITROGLYCERIN 0.4 MG SL SUBL: 0.4000 mg | SUBLINGUAL_TABLET | SUBLINGUAL | 0 refills | Status: DC | PRN

## 2015-12-26 MED ORDER — METOPROLOL TARTRATE 75 MG PO TABS: 75.0000 mg | ORAL_TABLET | Freq: Two times a day (BID) | ORAL | 0 refills | Status: DC

## 2015-12-26 MED ORDER — ATORVASTATIN CALCIUM 40 MG PO TABS: 40.0000 mg | ORAL_TABLET | Freq: Every day | ORAL | 0 refills | Status: DC

## 2015-12-26 MED ORDER — HYDROCODONE-ACETAMINOPHEN 5-325 MG PO TABS: 1.0000 | ORAL_TABLET | Freq: Four times a day (QID) | ORAL | 0 refills | Status: DC | PRN

## 2015-12-26 MED ORDER — FERROUS SULFATE 325 (65 FE) MG PO TABS: 325.0000 mg | ORAL_TABLET | Freq: Two times a day (BID) | ORAL | 0 refills | Status: DC

## 2015-12-26 MED ORDER — ADULT MULTIVITAMIN LIQUID CH: 15.0000 mL | Freq: Every day | ORAL | 0 refills | Status: DC

## 2015-12-26 MED ORDER — GLUCERNA SHAKE PO LIQD: 237.0000 mL | Freq: Three times a day (TID) | ORAL | 0 refills | Status: DC

## 2015-12-26 NOTE — Discharge Summary (Addendum)
Physician Discharge Summary  [name] [id number] DOB: Jan 19, 1961 DOA: 12/17/2015  PCP: Anthoney Harada, MD  Admit date: 12/17/2015 Discharge date: 12/26/2015  Admitted From: home  Disposition; home   Recommendations for Outpatient Follow-up:  1. Follow up with PCP in 1-2 weeks 2. Please obtain BMP/CBC in one week 3. Follow with cardiology for further care of CAD.   Home Health: Yes    Discharge Condition: stable.  CODE STATUS: full code.  Diet recommendation: Heart Healthy    Brief/Interim Summary: Brief Narrative   55 year old female with history of alcohol abuse, major depression, hearing impairment presented to the ED with nausea and vomiting with epigastric pain, and gap metabolic acidosis/lactic acidosis. She had leukocytosis of 20k and was admitted to telemetry. During the evening she went into acute respiratory failure leading to cardiac arrest after choking on a pill. CPR was initiated with ROSC in 6 minutes. She was transferred to ICU intubated. She was found to have right lower lobe pneumonia. Also found to have NSTEMI. Patient placed on pressors and IV heparin. She was successfully extubated on 8/11 and  transferred to hospitalist service.    Subjective:   No overnight issues.   Assessment  & Plan :    Acute hypoxic respiratory failure Likely secondary to aspiration pneumonia. Required intubation and ICU monitoring. Switch Unasyn to Augmentin if able to swallow pill. Stable on room air. Cultures negative.  Cardiac arrest with NSTEMI Likely contributed acute respiratory failure. IV heparin discontinued due to thrombocytopenia. Cardiology recommend outpatient workup and follow-up with Dr. Marlou Porch. Recommend in-hospital monitoring today for adequate blood pressure and heart rate control.  Continue aspirin, metoprolol and Lipitor. 2-D echo shows EF of 55-60% with no wall motion abnormality.    Aspiration pneumonia Switch Unasyn to Augmentin .  Patient received 9 days of antibiotics.     Alcohol dependence ([place]) Counseled strongly on cessation. She wishes to quit. Social work consulted.  Anion gap metabolic acidosis/lactic acidosis Suspected due to heavy alcohol use with dehydration and vomiting. Resolved with hydration.  Dysphagia with history of esophageal paresis Dysphagia 3 diet    Anemia/thrombocytopenia Suspected in the setting of acute illness/alcohol abuse Off IV heparin. Monitor. Trending down, repeat in am. Started  ferrous sulfate  S/P one unit PRBC. Hb increase to 9.     Major depression, chronic (HCC) Stable. Trazodone on hold due to prolonged QTC.     Acute kidney injury ([place]) Secondary to dehydration. Required aggressive hydration and now resolved.     Essential hypertension On metoprolol. Prn hydralazine   Hypokalemia replenished    Hx of MVR  stage I pressure ulcer, sacrum; local care.    Discharge Diagnoses:   Cardiac arrest with NSTEMI   Metabolic acidosis   Aspiration pneumonia of right lower lobe due to gastric secretions (HCC)   Status post patch closure of ASD   Major depression, chronic (HCC)   Dysphagia   Acute kidney injury (HCC)   Thrombocytopenia (HCC)   Essential hypertension   Lactic acidosis   Leucocytosis   Dehydration   Starvation ketoacidosis   Alcohol abuse   Pressure ulcer, sacrum, stage 1   Protein-calorie malnutrition, severe   NSTEMI (non-ST elevated myocardial infarction) ([place])   Typical atrial flutter (HCC)   Pneumonia   Hypokalemia   Cardiac arrest ([place])   Uncontrolled hypertension    Discharge Instructions  Discharge Instructions    Diet - low sodium heart healthy    Complete by:  As directed  Increase activity slowly    Complete by:  As directed       Medication List    STOP taking these medications   carvedilol 25 MG tablet Commonly known as:  COREG   diphenoxylate-atropine 2.5-0.025 MG tablet Commonly known as:   LOMOTIL     TAKE these medications   aspirin 81 MG chewable tablet Chew 81 mg by mouth daily.   atorvastatin 40 MG tablet Commonly known as:  LIPITOR Take 1 tablet (40 mg total) by mouth daily at 6 PM.   feeding supplement (GLUCERNA SHAKE) Liqd Take 237 mLs by mouth 3 (three) times daily between meals.   ferrous sulfate 325 (65 FE) MG tablet Take 1 tablet (325 mg total) by mouth 2 (two) times daily with a meal.   fexofenadine 180 MG tablet Commonly known as:  ALLEGRA Take 180 mg by mouth daily.   Fish Oil 1000 MG Caps Take 1,000 mg by mouth 2 (two) times daily.   folic acid 1 MG tablet Commonly known as:  FOLVITE Take 1 mg by mouth daily.   gabapentin 600 MG tablet Commonly known as:  NEURONTIN Take 600 mg by mouth at bedtime.   HYDROcodone-acetaminophen 5-325 MG tablet Commonly known as:  NORCO/VICODIN Take 1 tablet by mouth every 6 (six) hours as needed for moderate pain.   Metoprolol Tartrate 75 MG Tabs Take 75 mg by mouth 2 (two) times daily.   mirabegron ER 50 MG Tb24 tablet Commonly known as:  MYRBETRIQ Take 1 tablet (50 mg total) by mouth daily.   multivitamin Liqd Take 15 mLs by mouth daily.   nitroGLYCERIN 0.4 MG SL tablet Commonly known as:  NITROSTAT Place 1 tablet (0.4 mg total) under the tongue every 5 (five) minutes as needed for chest pain.   ondansetron 4 MG disintegrating tablet Commonly known as:  ZOFRAN ODT Take 1 tablet (4 mg total) by mouth every 8 (eight) hours as needed for nausea.   pantoprazole 40 MG tablet Commonly known as:  PROTONIX TAKE 1 TABLET (40 MG TOTAL) BY MOUTH DAILY.   VIIBRYD 40 MG Tabs Generic drug:  Vilazodone HCl Take 40 mg by mouth daily.   vitamin B-12 500 MCG tablet Commonly known as:  CYANOCOBALAMIN Take 500 mcg by mouth daily.   Vitamin D3 2000 units Tabs Take 1 tablet by mouth daily.      Follow-up Information    Cecilie Kicks, NP Follow up on 01/06/2016.   Specialties:  Cardiology, Radiology Why:   Cardiology Hospital Follow-Up on 01/06/2016 at 9:30AM (Dr. Kingsley Plan Nurse Practitioner).  Contact information: [address] [place] [address] [phone]          Allergies  Allergen Reactions  . Depakote [Divalproex Sodium] Anaphylaxis  . Diazepam Other (See Comments)    Died on operating table  . Valium Anaphylaxis  . Tizanidine Hcl Other (See Comments)    "passed out"    Consultations: Cardiology   Procedures/Studies: Ct Abdomen Pelvis Wo Contrast  Result Date: 12/06/2015 CLINICAL DATA:  55 year old female with abdominal pain EXAM: CT ABDOMEN AND PELVIS WITHOUT CONTRAST TECHNIQUE: Multidetector CT imaging of the abdomen and pelvis was performed following the standard protocol without IV contrast. COMPARISON:  Abdominal CT dated 10/06/2015 and ultrasound dated 10/21/2015 FINDINGS: Evaluation of this exam is limited in the absence of intravenous contrast. Evaluation is also limited due to respiratory motion artifact. There is cardiomegaly. Mechanical mitral valve. There is hypoattenuation of the cardiac blood pool suggestive of a  degree of anemia. Clinical correlation is recommended. There is slight prominence of the pulmonary vasculature is and the visualized lung bases which may represent mild congestive changes. No intra-abdominal free air or free fluid. Cholecystectomy. Fatty liver. The pancreas, spleen, adrenal glands, kidneys, visualized ureters, and urinary bladder appear unremarkable. The uterus is anteverted and grossly unremarkable. There is sigmoid diverticulosis without active inflammatory changes. There is a small hiatal hernia. There is no evidence of bowel obstruction or active inflammation. There is mild aortoiliac atherosclerotic disease. The abdominal aorta and IVC are grossly unremarkable on this noncontrast study. No portal venous gas identified. There is no adenopathy. There is a small fat containing umbilical hernia. The abdominal wall soft tissues are  otherwise unremarkable. There is degenerative changes of the spine. Scoliosis. Multilevel endplate irregularity and disc space narrowing. There is a chronic appearing 5 mm retropulsed fragment from the posterior aspect of the superior endplate of the L1 vertebrae. No acute fracture. IMPRESSION: No acute intra-abdominal pelvic pathology. Diverticulosis. Fatty liver. Electronically Signed   By: Anner Crete M.D.   On: 12/06/2015 06:44  Dg Chest 2 View  Result Date: 12/17/2015 CLINICAL DATA:  Shortness of breath, nausea and vomiting beginning this morning. EXAM: CHEST  2 VIEW COMPARISON:  PA and lateral chest 07/17/2015. FINDINGS: The lungs are clear. Heart size is normal. The patient is status post mitral valve repair. No pneumothorax or pleural effusion. Remote T12 compression fracture is seen. IMPRESSION: No acute disease. Electronically Signed   By: Inge Rise M.D.   On: 12/17/2015 15:23   Dg Forearm Right  Result Date: 12/04/2015 CLINICAL DATA:  Fall today with right distal forearm and wrist pain with swelling and bruising. EXAM: RIGHT FOREARM - 2 VIEW COMPARISON:  None. FINDINGS: Comminuted fracture the distal radial metaphysis extends to the articular surface apex anterior angulation noted at fracture site. No associated ulnar fracture is identified. IMPRESSION: Comminuted distal radius fracture with intra-articular extension. Electronically Signed   By: Misty Stanley M.D.   On: 12/04/2015 19:16  Dg Wrist Complete Right  Result Date: 12/04/2015 CLINICAL DATA:  Fall earlier today with wrist pain and swelling. EXAM: RIGHT WRIST - COMPLETE 3+ VIEW COMPARISON:  None. FINDINGS: Comminuted fracture of the distal radius identified with intra-articular extension. No associated fracture the distal ulna. Carpal alignment is anatomic. There are some degenerative changes at the first carpometacarpal joint and also at the first MCP joint. IMPRESSION: Comminuted distal radius fracture with intra-articular  extension. Electronically Signed   By: Misty Stanley M.D.   On: 12/04/2015 19:17  Dg Abd 1 View  Result Date: 12/18/2015 CLINICAL DATA:  OG tube placement. EXAM: ABDOMEN - 1 VIEW COMPARISON:  12/06/2015 FINDINGS: An OG tube is identified with tip overlying the distal stomach. The bowel gas pattern is unremarkable. Defibrillator pads overlying the lower chest noted. IMPRESSION: OG tube with tip overlying the distal stomach. Electronically Signed   By: Margarette Canada M.D.   On: 12/18/2015 01:15   Dg Chest Port 1 View  Result Date: 12/20/2015 CLINICAL DATA:  Patient with history of pneumonia and cardiomyopathy. EXAM: PORTABLE CHEST 1 VIEW COMPARISON:  Chest radiograph 12/19/2015 FINDINGS: Interval extubation and removal of enteric tube. Left IJ central venous catheter tip projects over the superior vena cava. Monitoring leads overlie the patient. Stable cardiomegaly. Interval worsening of airspace consolidation within the right mid and lower lung. Probable tiny bilateral pleural effusions. No pneumothorax. IMPRESSION: Interval increase in right mid and lower lung consolidation. Probable small bilateral pleural  effusions. Interval extubation. Electronically Signed   By: Lovey Newcomer M.D.   On: 12/20/2015 07:17   Dg Chest Port 1 View  Result Date: 12/19/2015 CLINICAL DATA:  Pneumonia. EXAM: PORTABLE CHEST 1 VIEW COMPARISON:  12/18/2015. FINDINGS: Endotracheal tube, NG tube, left IJ line stable position. Prior cardiac valve repair. Stable cardiomegaly. Progressive bibasilar atelectasis and infiltrates. Small right pleural effusion. No pneumothorax . IMPRESSION: 1. Lines and tubes in stable position. 2. Progressive bibasilar atelectasis and infiltrates. Small right pleural effusion. 3. No pneumothorax. Electronically Signed   By: Marcello Moores  Register   On: 12/19/2015 07:04   Dg Chest Port 1 View  Result Date: 12/18/2015 CLINICAL DATA:  Pneumonia, followup, intubation, central line, history cardiomyopathy, pulmonary  embolism, hypertension, GERD EXAM: PORTABLE CHEST 1 VIEW COMPARISON:  Portable exam 0943 hours compared 12/17/2015 FINDINGS: Tip of endotracheal tube projects 5.9 cm above carina. Nasogastric tube extends into stomach. LEFT jugular central venous catheter tip projects over SVC near cavoatrial junction. External pacing leads project over chest. Enlargement of cardiac silhouette post MVR. Mediastinal contours normal vascularity normal. Persistent RIGHT middle lobe consolidation consistent with pneumonia. Question developing LEFT lower lobe consolidation as well. Remaining lungs clear. No pleural effusion or pneumothorax. IMPRESSION: Persistent RIGHT middle lobe pneumonia with question developing infiltrate versus atelectasis in LEFT lower lobe. Line and tube positions as above. Electronically Signed   By: Lavonia Dana M.D.   On: 12/18/2015 11:13   Dg Chest Port 1 View  Result Date: 12/18/2015 CLINICAL DATA:  Central line placement.  Initial encounter. EXAM: PORTABLE CHEST 1 VIEW COMPARISON:  Chest radiograph performed earlier today at 10:32 p.m. FINDINGS: The patient's left IJ line is noted ending about the distal SVC. The patient's endotracheal tube is seen ending 2-3 cm above the carina. The right lower lobe airspace opacification is compatible with pneumonia, similar in appearance to the prior study. No pleural effusion or pneumothorax is seen. The cardiomediastinal silhouette is mildly enlarged. The patient is status post median sternotomy. A valve replacement is noted. External pacing pads are seen. No acute osseous abnormalities are identified. IMPRESSION: 1. Left IJ line noted ending about the distal SVC. 2. Endotracheal tube seen ending 2-3 cm above the carina. 3. Right lower lobe pneumonia, similar in appearance to the recent prior study. 4. Mild cardiomegaly. Electronically Signed   By: Garald Balding M.D.   On: 12/18/2015 00:17   Dg Chest Port 1 View  Result Date: 12/17/2015 CLINICAL DATA:  CPR and  intubation. EXAM: PORTABLE CHEST 1 VIEW COMPARISON:  12/17/2015. FINDINGS: Endotracheal tube is now identified with tip 2.3 cm above the carina. New dense consolidation/opacity throughout the medial right mid and lower lung noted. There is no evidence of pleural effusion or pneumothorax. Defibrillator pad overlying the lower chest noted. Cardiomegaly and cardiac valve replacement again identified. IMPRESSION: Endotracheal tube with tip 2.3 cm above the carina. New dense consolidations/opacity within the medial right mid and lower lung. Electronically Signed   By: Margarette Canada M.D.   On: 12/17/2015 23:00   Dg Abdomen Acute W/chest  Result Date: 12/06/2015 CLINICAL DATA:  55 year old female with abdominal pain EXAM: DG ABDOMEN ACUTE W/ 1V CHEST COMPARISON:  Chest radiograph dated 10/23/2015 FINDINGS: The lungs are clear. No pleural effusion or pneumothorax. Stable mild cardiomegaly. Median sternotomy wires and CABG vascular clips. Mechanical cardiac valve. There is no bowel dilatation or evidence of obstruction. No free air or radiopaque calculi. Right upper quadrant cholecystectomy clips. There is degenerative changes of the spine and  scoliosis. No acute fracture. IMPRESSION: No acute cardiopulmonary process. No bowel obstruction. Electronically Signed   By: Anner Crete M.D.   On: 12/06/2015 03:31      Subjective: Feeling better.  No chest pain   Discharge Exam: Vitals:   12/25/15 2106 12/26/15 0601  BP: 137/81 (!) 156/87  Pulse: 83 77  Resp: 18 20  Temp: 98.6 F (37 C) 98 F (36.7 C)   Vitals:   12/25/15 1155 12/25/15 1442 12/25/15 2106 12/26/15 0601  BP: 135/87 (!) 148/89 137/81 (!) 156/87  Pulse: 75 73 83 77  Resp: 18 18 18 20   Temp: 98.5 F (36.9 C) 98.1 F (36.7 C) 98.6 F (37 C) 98 F (36.7 C)  TempSrc: Oral Oral Oral Oral  SpO2: 99% 98% 98% 100%  Weight:      Height:        General: Pt is alert, awake, not in acute distress Cardiovascular: RRR, S1/S2 +, no rubs, no  gallops Respiratory: CTA bilaterally, no wheezing, no rhonchi Abdominal: Soft, NT, ND, bowel sounds + Extremities: no edema, no cyanosis    The results of significant diagnostics from this hospitalization (including imaging, microbiology, ancillary and laboratory) are listed below for reference.     Microbiology: Recent Results (from the past 240 hour(s))  Culture, blood (routine x 2)     Status: None   Collection Time: 12/17/15  6:49 PM  Result Value Ref Range Status   Specimen Description BLOOD LEFT HAND  Final   Special Requests IN PEDIATRIC BOTTLE [place]  Final   Culture   Final    NO GROWTH 5 DAYS Performed at [facility]    Report Status 12/22/2015 FINAL  Final  Culture, blood (routine x 2)     Status: None   Collection Time: 12/17/15  6:49 PM  Result Value Ref Range Status   Specimen Description BLOOD LEFT HAND  Final   Special Requests BOTTLES DRAWN AEROBIC ONLY 5CC  Final   Culture   Final    NO GROWTH 5 DAYS Performed at [facility]    Report Status 12/22/2015 FINAL  Final  MRSA PCR Screening     Status: Abnormal   Collection Time: 12/17/15  9:42 PM  Result Value Ref Range Status   MRSA by PCR POSITIVE (A) NEGATIVE Final    Comment:        The GeneXpert MRSA Assay (FDA approved for NASAL specimens only), is one component of a comprehensive MRSA colonization surveillance program. It is not intended to diagnose MRSA infection nor to guide or monitor treatment for MRSA infections. RESULT CALLED TO, READ BACK BY AND VERIFIED WITH: M.BARHAM,RN AT 0045 12/18/15 BY W.SHEA   Culture, respiratory (NON-Expectorated)     Status: None   Collection Time: 12/17/15 11:32 PM  Result Value Ref Range Status   Specimen Description TRACHEAL ASPIRATE  Final   Special Requests Normal  Final   Gram Stain   Final    MODERATE WBC PRESENT, PREDOMINANTLY PMN FEW GRAM POSITIVE COCCI IN CLUSTERS    Culture   Final    Consistent with normal respiratory  flora. Performed at [facility]    Report Status 12/20/2015 FINAL  Final  C difficile quick scan w PCR reflex     Status: None   Collection Time: 12/19/15  6:53 PM  Result Value Ref Range Status   C Diff antigen NEGATIVE NEGATIVE Final   C Diff toxin NEGATIVE NEGATIVE Final   C Diff  interpretation No C. difficile detected.  Final     Labs: BNP (last 3 results) No results for input(s): BNP in the last 8760 hours. Basic Metabolic Panel:  Recent Labs Lab 12/19/15 1610 12/20/15 0455 12/21/15 0400 12/22/15 0410 12/25/15 0445  NA 144 145 138 139 139  K 3.1* 3.3* 3.3* 3.7 3.2*  CL 108 111 104 105 104  CO2 27 27 27 27 28   GLUCOSE 99 90 94 84 79  BUN 18 12 8 8 7   CREATININE 1.05* 0.93 0.75 0.90 0.90  CALCIUM 7.2* 7.2* 7.7* 8.4* 7.8*  MG  --  1.7  --   --   --   PHOS 2.6 2.5  --   --   --    Liver Function Tests:  Recent Labs Lab 12/20/15 0455 12/21/15 0400  AST 93* 56*  ALT 29 28  ALKPHOS 68 77  BILITOT 0.6 0.5  PROT 5.6* 5.5*  ALBUMIN 2.8* 2.6*   No results for input(s): LIPASE, AMYLASE in the last 168 hours. No results for input(s): AMMONIA in the last 168 hours. CBC:  Recent Labs Lab 12/21/15 0400 12/22/15 0410 12/22/15 1600 12/23/15 0425 12/24/15 0445 12/25/15 0445 12/26/15 0422  WBC 4.1 3.0* 2.8* 2.3* 3.0*  --   --   NEUTROABS  --   --  1.4*  --   --   --   --   HGB 8.4* 8.0* 8.6* 8.0* 7.7* 7.8* 9.7*  HCT 25.7* 24.5* 26.5* 24.6* 23.3* 23.3* 28.6*  MCV 94.5 95.0 96.0 95.3 94.3  --   --   PLT 70* 67* 82* 76* 110*  --   --    Cardiac Enzymes:  Recent Labs Lab 12/19/15 1438 12/19/15 2038  TROPONINI 9.89* 7.63*   BNP: Invalid input(s): POCBNP CBG:  Recent Labs Lab 12/20/15 1927 12/20/15 2330 12/21/15 0316 12/21/15 0808 12/21/15 1152  GLUCAP 122* 104* 99 77 79   D-Dimer No results for input(s): DDIMER in the last 72 hours. Hgb A1c No results for input(s): HGBA1C in the last 72 hours. Lipid Profile No results for input(s):  CHOL, HDL, LDLCALC, TRIG, CHOLHDL, LDLDIRECT in the last 72 hours. Thyroid function studies No results for input(s): TSH, T4TOTAL, T3FREE, THYROIDAB in the last 72 hours.  Invalid input(s): FREET3 Anemia work up No results for input(s): VITAMINB12, FOLATE, FERRITIN, TIBC, IRON, RETICCTPCT in the last 72 hours. Urinalysis    Component Value Date/Time   COLORURINE YELLOW 12/17/2015 2300   APPEARANCEUR CLOUDY (A) 12/17/2015 2300   LABSPEC 1.014 12/17/2015 2300   PHURINE 5.5 12/17/2015 2300   GLUCOSEU NEGATIVE 12/17/2015 2300   HGBUR MODERATE (A) 12/17/2015 2300   BILIRUBINUR NEGATIVE 12/17/2015 2300   KETONESUR >80 (A) 12/17/2015 2300   PROTEINUR 100 (A) 12/17/2015 2300   UROBILINOGEN 0.2 12/15/2014 2213   NITRITE NEGATIVE 12/17/2015 2300   LEUKOCYTESUR NEGATIVE 12/17/2015 2300   Sepsis Labs Invalid input(s): PROCALCITONIN,  WBC,  LACTICIDVEN Microbiology Recent Results (from the past 240 hour(s))  Culture, blood (routine x 2)     Status: None   Collection Time: 12/17/15  6:49 PM  Result Value Ref Range Status   Specimen Description BLOOD LEFT HAND  Final   Special Requests IN PEDIATRIC BOTTLE [facility]  Final   Culture   Final    NO GROWTH 5 DAYS Performed at [facility]    Report Status 12/22/2015 FINAL  Final  Culture, blood (routine x 2)     Status: None   Collection Time: 12/17/15  6:49 PM  Result Value Ref Range Status   Specimen Description BLOOD LEFT HAND  Final   Special Requests BOTTLES DRAWN AEROBIC ONLY 5CC  Final   Culture   Final    NO GROWTH 5 DAYS Performed at [facility]    Report Status 12/22/2015 FINAL  Final  MRSA PCR Screening     Status: Abnormal   Collection Time: 12/17/15  9:42 PM  Result Value Ref Range Status   MRSA by PCR POSITIVE (A) NEGATIVE Final    Comment:        The GeneXpert MRSA Assay (FDA approved for NASAL specimens only), is one component of a comprehensive MRSA colonization surveillance program. It is not intended  to diagnose MRSA infection nor to guide or monitor treatment for MRSA infections. RESULT CALLED TO, READ BACK BY AND VERIFIED WITH: M.BARHAM,RN AT 0045 12/18/15 BY W.SHEA   Culture, respiratory (NON-Expectorated)     Status: None   Collection Time: 12/17/15 11:32 PM  Result Value Ref Range Status   Specimen Description TRACHEAL ASPIRATE  Final   Special Requests Normal  Final   Gram Stain   Final    MODERATE WBC PRESENT, PREDOMINANTLY PMN FEW GRAM POSITIVE COCCI IN CLUSTERS    Culture   Final    Consistent with normal respiratory flora. Performed at [facility]    Report Status 12/20/2015 FINAL  Final  C difficile quick scan w PCR reflex     Status: None   Collection Time: 12/19/15  6:53 PM  Result Value Ref Range Status   C Diff antigen NEGATIVE NEGATIVE Final   C Diff toxin NEGATIVE NEGATIVE Final   C Diff interpretation No C. difficile detected.  Final     Time coordinating discharge: Over 30 minutes  SIGNED:   Elmarie Shiley, MD  [facility] 12/26/2015, 10:58 AM   If 7PM-7AM, please contact night-coverage [email or web address] Password [email or web address]

## 2015-12-29 DIAGNOSIS — R079 Chest pain, unspecified: Secondary | ICD-10-CM | POA: Diagnosis not present

## 2015-12-29 DIAGNOSIS — I2510 Atherosclerotic heart disease of native coronary artery without angina pectoris: Secondary | ICD-10-CM | POA: Diagnosis not present

## 2015-12-31 ENCOUNTER — Encounter: Payer: Self-pay | Admitting: Family Medicine

## 2015-12-31 ENCOUNTER — Telehealth: Payer: Self-pay | Admitting: Cardiovascular Disease

## 2015-12-31 ENCOUNTER — Ambulatory Visit (INDEPENDENT_AMBULATORY_CARE_PROVIDER_SITE_OTHER): Payer: Medicare Other | Admitting: Family Medicine

## 2015-12-31 ENCOUNTER — Ambulatory Visit (INDEPENDENT_AMBULATORY_CARE_PROVIDER_SITE_OTHER): Payer: Medicare Other

## 2015-12-31 VITALS — BP 122/72 | HR 106 | Temp 98.3°F | Resp 17 | Ht 64.0 in | Wt 130.0 lb

## 2015-12-31 DIAGNOSIS — R0781 Pleurodynia: Secondary | ICD-10-CM

## 2015-12-31 DIAGNOSIS — E872 Acidosis, unspecified: Secondary | ICD-10-CM

## 2015-12-31 DIAGNOSIS — R079 Chest pain, unspecified: Secondary | ICD-10-CM | POA: Diagnosis not present

## 2015-12-31 MED ORDER — HYDROCODONE-ACETAMINOPHEN 5-325 MG PO TABS: 1.0000 | ORAL_TABLET | Freq: Three times a day (TID) | ORAL | 0 refills | Status: DC | PRN

## 2015-12-31 NOTE — Telephone Encounter (Signed)
No note needed 

## 2015-12-31 NOTE — Progress Notes (Signed)
[name] is a 55 y.o. female who presents to Urgent Care today for rib pain:  1.  Rib pain:  Patient recently admitted for abdominal pain.  She underwent a cardiac arrest during the admission after aspiration on medications.  Had chest compressions/CPR and admitted to ICU.  Discharged several days later.    She has been complaining of rib pain since her chest compressions.  Prescribed Norco after discharge.  10 pills.  She is out of these.  States pain is sharp stabbing pain lateral ribs bilaterally.  Chest pain not worsened on exertion.  No DOE.  No palpitations.  She has otherwise felt well since leaving the hospital.  She has not tried taking anything else for pain relief besides the Norco.  Has not seen any bruising on her chest.    ROS as above.    PMH reviewed. Patient is a nonsmoker.   Past Medical History:  Diagnosis Date  . Allergy    SEASONAL  . Anemia   . Anxiety   . Arthritis   . ASD (atrial septal defect)    a. s/p ASD repair in 1973  . CAD (coronary artery disease)    a. 12/2015: NSTEMI occurring after cardiac arrest secondary to aspiration PNA. Echo w/ EF of 55-60%, no WMA. Outpt ischemic eval needed.  . Cardiomyopathy   . Deaf   . Depression   . Fibromyalgia   . Gastritis   . GERD (gastroesophageal reflux disease)   . Hearing impairment   . Hypertension    [name], take htn medication to regulate heart beat.  . Migraine   . Osteoporosis   . Pulmonary embolism ([place])    occured post c-section of her daughter  . Sleep apnea   . Vitamin D deficiency    Past Surgical History:  Procedure Laterality Date  . APPENDECTOMY    . CARDIAC SURGERY  January 2007   mitral valve repair  . CARDIAC SURGERY  1973   atrial septal defect  . CARDIAC SURGERY    . COCHLEAR IMPLANT    . INNER EAR SURGERY     TUBES  . LAPAROSCOPIC CHOLECYSTECTOMY  2012  . TONSILLECTOMY AND ADENOIDECTOMY    . TUBAL LIGATION      Medications reviewed. Current Outpatient Prescriptions   Medication Sig Dispense Refill  . aspirin 81 MG chewable tablet Chew 81 mg by mouth daily.    [name] atorvastatin (LIPITOR) 40 MG tablet Take 1 tablet (40 mg total) by mouth daily at 6 PM. 30 tablet 0  . Cholecalciferol (VITAMIN D3) 2000 units TABS Take 1 tablet by mouth daily.    . feeding supplement, GLUCERNA SHAKE, (GLUCERNA SHAKE) LIQD Take 237 mLs by mouth 3 (three) times daily between meals. 30 Can 0  . ferrous sulfate 325 (65 FE) MG tablet Take 1 tablet (325 mg total) by mouth 2 (two) times daily with a meal. 30 tablet 0  . folic acid (FOLVITE) 1 MG tablet Take 1 mg by mouth daily.    . Metoprolol Tartrate 75 MG TABS Take 75 mg by mouth 2 (two) times daily. 60 tablet 0  . mirabegron ER (MYRBETRIQ) 50 MG TB24 tablet Take 1 tablet (50 mg total) by mouth daily. 30 tablet 11  . Omega-3 Fatty Acids (FISH OIL) 1000 MG CAPS Take 1,000 mg by mouth 2 (two) times daily.     . ondansetron (ZOFRAN ODT) 4 MG disintegrating tablet Take 1 tablet (4 mg total) by mouth every 8 (eight) hours  as needed for nausea. 6 tablet 0  . pantoprazole (PROTONIX) 40 MG tablet TAKE 1 TABLET (40 MG TOTAL) BY MOUTH DAILY. 14 tablet 1  . Vilazodone HCl (VIIBRYD) 40 MG TABS Take 40 mg by mouth daily.    . vitamin B-12 (CYANOCOBALAMIN) 500 MCG tablet Take 500 mcg by mouth daily.    . fexofenadine (ALLEGRA) 180 MG tablet Take 180 mg by mouth daily.    [name] gabapentin (NEURONTIN) 600 MG tablet Take 600 mg by mouth at bedtime.     [name] HYDROcodone-acetaminophen (NORCO/VICODIN) 5-325 MG tablet Take 1 tablet by mouth every 6 (six) hours as needed for moderate pain. (Patient not taking: Reported on 12/31/2015) 10 tablet 0  . Multiple Vitamin (MULTIVITAMIN) LIQD Take 15 mLs by mouth daily. (Patient not taking: Reported on 12/31/2015) 1 Bottle 0  . nitroGLYCERIN (NITROSTAT) 0.4 MG SL tablet Place 1 tablet (0.4 mg total) under the tongue every 5 (five) minutes as needed for chest pain. (Patient not taking: Reported on 12/31/2015) 30 tablet 0   No  current facility-administered medications for this visit.      Physical Exam:  BP 122/72 (BP Location: Right Arm, Patient Position: Sitting, Cuff Size: Normal)   Pulse (!) 106   Temp 98.3 F (36.8 C) (Oral)   Resp 17   Ht 5\' 4"  (1.626 m)   Wt 130 lb (59 kg)   SpO2 97%   BMI 22.31 kg/m  Gen:  Alert, cooperative patient who appears stated age in no acute distress.  Vital signs reviewed. HEENT: EOMI,  MMM Pulm:  Clear to auscultation bilaterally with good air movement.   Cardiac:  Regular rate and rhythm  Chest:  TTP across sternal borders both left and right. CABG scar noted, well healed.  No bruising.   Abd:  Soft/nondistended/nontender.   Exts: Non edematous BL  LE, warm and well perfused.   Assessment and Plan:  1.  Rib pain: - negative CXR for any fractures.   - short term course of both hydrocodone and ibuprofen for pain relief - ibuprofen short term because she has history of CAD - Discussed the expected course for her rib pain.  She improve over the next few days.  Sore from CPR.  - See instructions.  If more narcotics are needed, she will need to Fu with her usual PCP.   - no red flags.  No evidence of cardiac pain  2.  Electrolyte disturbances - check potassium as it was low last check.   - She was seen by PCP on Monday for her other discharge diagnoses.  Not prescribed any pain medications there.

## 2015-12-31 NOTE — Patient Instructions (Addendum)
The good news is that your ribs are not broken.  They are likely bruised from the CPR.   Each day, this should be getting better.  Use ice for pain relief.  Make sure to take ibuprofen for pain relief from the inflammation from your CPR.    We can do a short-term course of hydrocodone for pain relief.  Take this when the pain is not relieved by the ibuprofen.  Any further refills will need to come from your primary care provider.   We are checking blood tests today as well and will call you with the results.    It was good to see you today.      IF you received an x-ray today, you will receive an invoice from [facility]. Please contact [facility] at [phone] with questions or concerns regarding your invoice.   IF you received labwork today, you will receive an invoice from [employer]. Please contact [facility] at [phone] with questions or concerns regarding your invoice.   Our billing staff will not be able to assist you with questions regarding bills from these companies.  You will be contacted with the lab results as soon as they are available. The fastest way to get your results is to activate your My Chart account. Instructions are located on the last page of this paperwork. If you have not heard from [place] regarding the results in 2 weeks, please contact this office.

## 2016-01-01 LAB — CBC
HCT: 35.7 % (ref 35.0–45.0)
Hemoglobin: 11.5 g/dL — ABNORMAL LOW (ref 11.7–15.5)
MCH: 30.2 pg (ref 27.0–33.0)
MCHC: 32.2 g/dL (ref 32.0–36.0)
MCV: 93.7 fL (ref 80.0–100.0)
MPV: 10.3 fL (ref 7.5–12.5)
PLATELETS: 397 10*3/uL (ref 140–400)
RBC: 3.81 MIL/uL (ref 3.80–5.10)
RDW: 16.5 % — ABNORMAL HIGH (ref 11.0–15.0)
WBC: 10 10*3/uL (ref 3.8–10.8)

## 2016-01-01 LAB — COMPREHENSIVE METABOLIC PANEL
ALBUMIN: 4 g/dL (ref 3.6–5.1)
ALK PHOS: 150 U/L — AB (ref 33–130)
ALT: 11 U/L (ref 6–29)
AST: 22 U/L (ref 10–35)
BILIRUBIN TOTAL: 0.4 mg/dL (ref 0.2–1.2)
BUN: 13 mg/dL (ref 7–25)
CALCIUM: 8.5 mg/dL — AB (ref 8.6–10.4)
CO2: 22 mmol/L (ref 20–31)
CREATININE: 0.8 mg/dL (ref 0.50–1.05)
Chloride: 105 mmol/L (ref 98–110)
Glucose, Bld: 77 mg/dL (ref 65–99)
Potassium: 4 mmol/L (ref 3.5–5.3)
SODIUM: 141 mmol/L (ref 135–146)
TOTAL PROTEIN: 6.6 g/dL (ref 6.1–8.1)

## 2016-01-02 DIAGNOSIS — M25531 Pain in right wrist: Secondary | ICD-10-CM | POA: Diagnosis not present

## 2016-01-05 NOTE — Progress Notes (Signed)
Cardiology Office Note   Date:  01/07/2016   ID:  [name], DOB March 19, 1961, MRN [id number]  PCP:  Anthoney Harada, MD  Cardiologist:  *Dr. Marlou Porch   Chief Complaint  Patient presents with  . Hospitalization Follow-up    + chest pain      History of Present Illness: [name] is a 55 y.o. female who presents for post hospitalization.   She has a medical history of ASD (s/p repair along with mitral valve repair), prior PE, HLD, depression, alcohol abuse and recent admission for starvation ketoacidosis (from 7/29 - 7/30-2017) who presented to [facility] ED on 12/17/2015 for persistent nausea and vomiting starting earlier that day. Had cardiac arrest in setting of aspiration. Cards consulted for NSTEMI.   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  Also with cochlear implant, hearing impaired and usually reads lips.   After admit during the evening she went into acute respiratory failure leading to cardiac arrest after choking on a pill. CPR was initiated with ROSC in 6 minutes. She was transferred to ICU intubated. She was found to have right lower lobe pneumonia. Also found to have NSTEMI.  Troponin peaked at 21.83.  Patient placed on pressors and IV heparin. She was successfully extubated on 8/11 and transferred to hospitalist service.  EKG on admission showed sinus tachycardia, HR 137, with a new LBBB.  - echocardiogram in 10/28/15 showed an EF of 55-60% with no wall motion abnormalities. Echo this admission shows preserved EF of 55-60%.  Prior to discharge we recommended ischemic eval.  once she improves from PNA.[name]  Today she complains of constant chest pain.  Increases with movement, felt to be related to CPR.  No fx seen on CXR.  Recent cough with clear mucus and CXR post hospitalization without PNA.  Clear.  She continues to be SOB with activity.  She and her mother are upset that  they did not get papers of her diagnosis.  I asked them to call [facility] and sign release of information form for information.  Medical records. They did not have paper work with them that they did receive.    She does not sleep well.  She has not had any ETOH since discharge.  We discussed needing further testing on her heart.       Past Medical History:  Diagnosis Date  . Allergy    SEASONAL  . Anemia   . Anxiety   . Arthritis   . ASD (atrial septal defect)    a. s/p ASD repair in 1973  . CAD (coronary artery disease)    a. 12/2015: NSTEMI occurring after cardiac arrest secondary to aspiration PNA. Echo w/ EF of 55-60%, no WMA. Outpt ischemic eval needed.  . Cardiomyopathy   . Deaf   . Depression   . Dysphagia    due to esophageal infections.  . Fibromyalgia   . Gastritis   . GERD (gastroesophageal reflux disease)   . Hearing impairment   . Hypertension    [name], take htn medication to regulate heart beat.  . Migraine   . Osteoporosis   . Pulmonary embolism ([place])    occured post c-section of her daughter  . Sleep apnea   . Vitamin D deficiency     Past Surgical History:  Procedure Laterality Date  . APPENDECTOMY    . CARDIAC SURGERY  January 2007   mitral valve repair  .  CARDIAC SURGERY  1973   atrial septal defect  . CARDIAC SURGERY    . COCHLEAR IMPLANT    . INNER EAR SURGERY     TUBES  . LAPAROSCOPIC CHOLECYSTECTOMY  2012  . TONSILLECTOMY AND ADENOIDECTOMY    . TUBAL LIGATION       Current Outpatient Prescriptions  Medication Sig Dispense Refill  . aspirin 81 MG chewable tablet Chew 81 mg by mouth daily.    [name] atorvastatin (LIPITOR) 40 MG tablet Take 1 tablet (40 mg total) by mouth daily at 6 PM. 30 tablet 0  . Cholecalciferol (VITAMIN D3) 2000 units TABS Take 1 tablet by mouth daily.    . feeding supplement, GLUCERNA SHAKE, (GLUCERNA SHAKE) LIQD Take 237 mLs by mouth 3 (three) times daily between meals. 30 Can 0  . ferrous sulfate 325 (65 FE) MG tablet Take 1  tablet (325 mg total) by mouth 2 (two) times daily with a meal. 30 tablet 0  . fexofenadine (ALLEGRA) 180 MG tablet Take 180 mg by mouth daily.    . fluticasone (FLONASE) 50 MCG/ACT nasal spray Place 2 sprays into both nostrils daily.    . folic acid (FOLVITE) 1 MG tablet Take 1 mg by mouth daily.    . Metoprolol Tartrate 75 MG TABS Take 75 mg by mouth 2 (two) times daily. 60 tablet 0  . mirabegron ER (MYRBETRIQ) 50 MG TB24 tablet Take 1 tablet (50 mg total) by mouth daily. 30 tablet 11  . nitroGLYCERIN (NITROSTAT) 0.4 MG SL tablet Place 1 tablet (0.4 mg total) under the tongue every 5 (five) minutes as needed for chest pain. 30 tablet 0  . Omega-3 Fatty Acids (FISH OIL) 1000 MG CAPS Take 1,000 mg by mouth 2 (two) times daily.     . ondansetron (ZOFRAN ODT) 4 MG disintegrating tablet Take 1 tablet (4 mg total) by mouth every 8 (eight) hours as needed for nausea. 6 tablet 0  . pantoprazole (PROTONIX) 40 MG tablet TAKE 1 TABLET (40 MG TOTAL) BY MOUTH DAILY. 14 tablet 1  . Vilazodone HCl (VIIBRYD) 40 MG TABS Take 40 mg by mouth daily.    . vitamin B-12 (CYANOCOBALAMIN) 500 MCG tablet Take 500 mcg by mouth daily.    [name] ibuprofen (ADVIL,MOTRIN) 600 MG tablet Take 1 tablet (600 mg total) by mouth every 8 (eight) hours as needed (for chest pain.). 45 tablet 0   No current facility-administered medications for this visit.     Allergies:   Depakote [divalproex sodium]; Diazepam; Valium; and Tizanidine hcl    Social History:  The patient  reports that she has never smoked. She has never used smokeless tobacco. She reports that she drinks alcohol. She reports that she does not use drugs.   Family History:  The patient's family history includes Breast cancer in her mother; Depression in her sister; Heart disease in her father; Hypertension in her father.    ROS:  General:no colds or fevers, +  weight loss with hospitaliztion Skin:no rashes or ulcers HEENT:no blurred vision, no congestion, reads lips  due to deafness, does have cochlear implant.    CV:see HPI PUL:see HPI GI:no diarrhea constipation or melena, no indigestion GU:no hematuria, no dysuria MS:no joint pain, no claudication Neuro:no syncope, no lightheadedness Endo:no diabetes, no thyroid disease  Wt Readings from Last 3 Encounters:  01/06/16 133 lb 6.4 oz (60.5 kg)  12/31/15 130 lb (59 kg)  12/25/15 151 lb (68.5 kg)     PHYSICAL EXAM: VS:  BP  124/90   Pulse 98   Ht 5\' 3"  (1.6 m)   Wt 133 lb 6.4 oz (60.5 kg)   BMI 23.63 kg/m  , BMI Body mass index is 23.63 kg/m. General:Pleasant affect, NAD Skin:Warm and dry, brisk capillary refill HEENT:normocephalic, sclera clear, mucus membranes moist Neck:supple, no JVD, no bruits  Heart:S1S2 RRR without murmur, gallup, rub or click, + chest wall pain to palpation Lungs:clear without rales, rhonchi, or wheezes [id number], non tender, + BS, do not palpate liver spleen or masses Ext:no lower ext edema, 2+ pedal pulses, 2+ radial pulses Neuro:alert and oriented X 3, MAE, follows commands, + facial symmetry    EKG:  EKG is ordered today. The ekg ordered today demonstrates SR at 96 nonspecific T wave abnormality  Prolonged Qtc.    Recent Labs: 10/24/2015: TSH 6.205 12/20/2015: Magnesium 1.7 12/31/2015: ALT 11; BUN 13; Creat 0.80; Hemoglobin 11.5; Platelets 397; Potassium 4.0; Sodium 141    Lipid Panel    Component Value Date/Time   CHOL 149 12/20/2015 0455   TRIG 167 (H) 12/20/2015 0455   HDL 35 (L) 12/20/2015 0455   CHOLHDL 4.3 12/20/2015 0455   VLDL 33 12/20/2015 0455   LDLCALC 81 12/20/2015 0455       Other studies Reviewed: Additional studies/ records that were reviewed today include:  ECHO:.  12/19/15 Study Conclusions  - Left ventricle: The cavity size was normal. Wall thickness was   normal. Systolic function was normal. The estimated ejection   fraction was in the range of 55% to 60%. Wall motion was normal;   there were no regional wall motion  abnormalities. The study is   not technically sufficient to allow evaluation of LV diastolic   function. - Mitral valve: Prior procedures included surgical repair. There   was mild regurgitation. Valve area by pressure half-time: 2.35   cm^2. - Left atrium: The atrium was moderately dilated. - Tricuspid valve: There was moderate regurgitation. - Pulmonary arteries: Systolic pressure was moderately increased. - Pericardium, extracardiac: A small pericardial effusion was   identified.  Impressions:  - Normal LV systolic function; s/p MV repair with mild MR; moderate   LAE; moderate TR with moderately elevated pulmonary pressure.   ASSESSMENT AND PLAN:  1. NSTEMI with aspiration PNA resp. Arrest, CPR.  Discussed with Dr. Marlou Porch and we will proceed with lexiscan myoview.  For her pain I recommended ibuprofen 600 mg every 8 hours as needed.  Her PCP did not wish to order pain med per pt.  - chest wall pain does not improve, consider CT chest to rule out fx of ribs or sternum  2.  Hx ASD repair and MVRepair and stable echo.  3.  ETOH abuse has not had alcohol since discharge.  4.  PNA due to aspiration resolved.    5. Deaf -reads lips, does have cochlear  Implant but has not helped much per pt.    6.  Dep[facility] by [facility].   7.  fx Lt wrist.    Follow up with me post nuc study.  Will have Dr. Marlou Porch review.     Current medicines are reviewed with the patient today.  The patient Has no concerns regarding medicines.  The following changes have been made:  See above Labs/ tests ordered today include:see above  Disposition:   FU:  see above  Signed, Cecilie Kicks, NP  01/07/2016 12:29 AM    [place], [place], [place] [address]  [place], [place] Phone: [phone]; Fax: [phone]

## 2016-01-06 ENCOUNTER — Encounter: Payer: Self-pay | Admitting: Cardiology

## 2016-01-06 ENCOUNTER — Ambulatory Visit (INDEPENDENT_AMBULATORY_CARE_PROVIDER_SITE_OTHER): Payer: Medicare Other | Admitting: Cardiology

## 2016-01-06 VITALS — BP 124/90 | HR 98 | Ht 63.0 in | Wt 133.4 lb

## 2016-01-06 DIAGNOSIS — Q211 Atrial septal defect, unspecified: Secondary | ICD-10-CM

## 2016-01-06 DIAGNOSIS — J189 Pneumonia, unspecified organism: Secondary | ICD-10-CM

## 2016-01-06 DIAGNOSIS — I214 Non-ST elevation (NSTEMI) myocardial infarction: Secondary | ICD-10-CM | POA: Diagnosis not present

## 2016-01-06 DIAGNOSIS — I10 Essential (primary) hypertension: Secondary | ICD-10-CM | POA: Diagnosis not present

## 2016-01-06 DIAGNOSIS — R072 Precordial pain: Secondary | ICD-10-CM

## 2016-01-06 DIAGNOSIS — H9193 Unspecified hearing loss, bilateral: Secondary | ICD-10-CM

## 2016-01-06 DIAGNOSIS — Z9889 Other specified postprocedural states: Secondary | ICD-10-CM

## 2016-01-06 MED ORDER — IBUPROFEN 600 MG PO TABS
600.0000 mg | ORAL_TABLET | Freq: Three times a day (TID) | ORAL | 0 refills | Status: DC | PRN
Start: 2016-01-06 — End: 2016-02-03

## 2016-01-06 NOTE — Patient Instructions (Signed)
Procedures/Testing:  Your physician has requested that you have a lexiscan myoview. For further information please visit [email or web address]. Please follow instruction sheet, as given.   Medications:  Take Ibuprofen 600 mg every 8 hours as needed for chest pain.   Follow-Up:  Your physician recommends that you schedule a follow-up appointment in:1 weeks after your test with Mickel Baas or Dr. Marlou Porch.

## 2016-01-07 ENCOUNTER — Telehealth (HOSPITAL_COMMUNITY): Payer: Self-pay | Admitting: *Deleted

## 2016-01-07 NOTE — Telephone Encounter (Signed)
Left message on voicemail per DPR in reference to upcoming appointment scheduled on 01/09/16 with detailed instructions given per Myocardial Perfusion Study Information Sheet for the test. LM to arrive 15 minutes early, and that it is imperative to arrive on time for appointment to keep from having the test rescheduled. If you need to cancel or reschedule your appointment, please call the office within 24 hours of your appointment. Failure to do so may result in a cancellation of your appointment, and a $50 no show fee. Phone number given for call back for any questions. Barnet Benavides Jacqueline    

## 2016-01-09 ENCOUNTER — Ambulatory Visit (HOSPITAL_COMMUNITY): Payer: Medicare Other | Attending: Internal Medicine

## 2016-01-09 DIAGNOSIS — R0609 Other forms of dyspnea: Secondary | ICD-10-CM | POA: Insufficient documentation

## 2016-01-09 DIAGNOSIS — R072 Precordial pain: Secondary | ICD-10-CM

## 2016-01-09 DIAGNOSIS — I214 Non-ST elevation (NSTEMI) myocardial infarction: Secondary | ICD-10-CM | POA: Diagnosis not present

## 2016-01-09 DIAGNOSIS — R110 Nausea: Secondary | ICD-10-CM | POA: Insufficient documentation

## 2016-01-09 LAB — MYOCARDIAL PERFUSION IMAGING
CHL CUP NUCLEAR SSS: 13
CSEPPHR: 106 {beats}/min
LHR: 0.45
LVDIAVOL: 49 mL (ref 46–106)
LVSYSVOL: 12 mL
Rest HR: 98 {beats}/min
SDS: 5
SRS: 8
TID: 0.98

## 2016-01-09 MED ORDER — REGADENOSON 0.4 MG/5ML IV SOLN
0.4000 mg | Freq: Once | INTRAVENOUS | Status: AC
  Administered 2016-01-09: 0.4 mg via INTRAVENOUS

## 2016-01-09 MED ORDER — TECHNETIUM TC 99M TETROFOSMIN IV KIT
30.5000 | PACK | Freq: Once | INTRAVENOUS | Status: AC | PRN
  Administered 2016-01-09: 31 via INTRAVENOUS
  Filled 2016-01-09: qty 31

## 2016-01-09 MED ORDER — TECHNETIUM TC 99M TETROFOSMIN IV KIT
10.6000 | PACK | Freq: Once | INTRAVENOUS | Status: AC | PRN
  Administered 2016-01-09: 11 via INTRAVENOUS
  Filled 2016-01-09: qty 11

## 2016-01-16 DIAGNOSIS — M25531 Pain in right wrist: Secondary | ICD-10-CM | POA: Diagnosis not present

## 2016-01-20 ENCOUNTER — Encounter: Payer: Self-pay | Admitting: Cardiology

## 2016-01-20 NOTE — Progress Notes (Deleted)
Cardiology Office Note   Date:  01/20/2016   ID:  [name], DOB 1960/07/05, MRN [id number]  PCP:  Anthoney Harada, MD  Cardiologist:  Dr. Marlou Larson    No chief complaint on file.     History of Present Illness: [name] is a 55 y.o. female who presents for follow up of nuc study after cardiac arrest.  She has a medical history of ASD (s/p repair along with mitral valve repair), prior PE, HLD, depression, alcohol abuse and recent admission for starvation ketoacidosis (from 7/29 - 7/30-2017) who presented to [facility] ED on 12/17/2015 for persistent nausea and vomiting starting earlier that day. Had cardiac arrest in setting of aspiration. Cards consulted for NSTEMI.   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  Also with cochlear implant, hearing impaired and usually reads lips.   After admit during the evening she went into acute respiratory failure leading to cardiac arrest after choking on a pill. CPR was initiated with ROSC in 6 minutes. She was transferred to ICU intubated. She was found to have right lower lobe pneumonia. Also found to have NSTEMI.  Troponin peaked at 21.83.  Patient placed on pressors and IV heparin. She was successfully extubated on 8/11 and transferred to hospitalist service.  EKG on admission showed sinus tachycardia, HR 137, with a new LBBB.  - echocardiogram in 10/28/15 showed an EF of 55-60% with no wall motion abnormalities. Echo this admission shows preserved EF of 55-60%.  Prior to discharge we recommended ischemic eval.  once she improves from PNA.[name]  Past Medical History:  Diagnosis Date  . Allergy    SEASONAL  . Anemia   . Anxiety   . Arthritis   . ASD (atrial septal defect)    a. s/p ASD repair in 1973  . CAD (coronary artery disease)    a. 12/2015: NSTEMI occurring after cardiac arrest secondary to aspiration PNA. Echo w/ EF of 55-60%, no  WMA. Outpt ischemic eval needed.  . Cardiomyopathy   . Deaf   . Depression   . Dysphagia    due to esophageal infections.  . Fibromyalgia   . Gastritis   . GERD (gastroesophageal reflux disease)   . Hearing impairment   . Hypertension    [name], take htn medication to regulate heart beat.  . Migraine   . Osteoporosis   . Pulmonary embolism ([place])    occured post c-section of her daughter  . Sleep apnea   . Vitamin D deficiency     Past Surgical History:  Procedure Laterality Date  . APPENDECTOMY    . CARDIAC SURGERY  January 2007   mitral valve repair  . CARDIAC SURGERY  1973   atrial septal defect  . CARDIAC SURGERY    . COCHLEAR IMPLANT    . INNER EAR SURGERY     TUBES  . LAPAROSCOPIC CHOLECYSTECTOMY  2012  . TONSILLECTOMY AND ADENOIDECTOMY    . TUBAL LIGATION       Current Outpatient Prescriptions  Medication Sig Dispense Refill  . aspirin 81 MG chewable tablet Chew 81 mg by mouth daily.    [name] atorvastatin (LIPITOR) 40 MG tablet Take 1 tablet (40 mg total) by mouth daily at 6 PM. 30 tablet 0  . Cholecalciferol (VITAMIN D3) 2000 units TABS Take 1 tablet by mouth daily.    . feeding supplement, GLUCERNA SHAKE, (GLUCERNA SHAKE) LIQD Take 237 mLs  by mouth 3 (three) times daily between meals. 30 Can 0  . ferrous sulfate 325 (65 FE) MG tablet Take 1 tablet (325 mg total) by mouth 2 (two) times daily with a meal. 30 tablet 0  . fexofenadine (ALLEGRA) 180 MG tablet Take 180 mg by mouth daily.    . fluticasone (FLONASE) 50 MCG/ACT nasal spray Place 2 sprays into both nostrils daily.    . folic acid (FOLVITE) 1 MG tablet Take 1 mg by mouth daily.    [name] ibuprofen (ADVIL,MOTRIN) 600 MG tablet Take 1 tablet (600 mg total) by mouth every 8 (eight) hours as needed (for chest pain.). 45 tablet 0  . Metoprolol Tartrate 75 MG TABS Take 75 mg by mouth 2 (two) times daily. 60 tablet 0  . mirabegron ER (MYRBETRIQ) 50 MG TB24 tablet Take 1 tablet (50 mg total) by mouth daily. 30 tablet 11    . nitroGLYCERIN (NITROSTAT) 0.4 MG SL tablet Place 1 tablet (0.4 mg total) under the tongue every 5 (five) minutes as needed for chest pain. 30 tablet 0  . Omega-3 Fatty Acids (FISH OIL) 1000 MG CAPS Take 1,000 mg by mouth 2 (two) times daily.     . ondansetron (ZOFRAN ODT) 4 MG disintegrating tablet Take 1 tablet (4 mg total) by mouth every 8 (eight) hours as needed for nausea. 6 tablet 0  . pantoprazole (PROTONIX) 40 MG tablet TAKE 1 TABLET (40 MG TOTAL) BY MOUTH DAILY. 14 tablet 1  . Vilazodone HCl (VIIBRYD) 40 MG TABS Take 40 mg by mouth daily.    . vitamin B-12 (CYANOCOBALAMIN) 500 MCG tablet Take 500 mcg by mouth daily.     No current facility-administered medications for this visit.     Allergies:   Depakote [divalproex sodium]; Diazepam; Valium; and Tizanidine hcl    Social History:  The patient  reports that she has never smoked. She has never used smokeless tobacco. She reports that she drinks alcohol. She reports that she does not use drugs.   Family History:  The patient's ***family history includes Breast cancer in her mother; Depression in her sister; Heart disease in her father; Hypertension in her father.    ROS:  General:no colds or fevers, no weight changes Skin:no rashes or ulcers HEENT:no blurred vision, no congestion CV:see HPI PUL:see HPI GI:no diarrhea constipation or melena, no indigestion GU:no hematuria, no dysuria MS:no joint pain, no claudication Neuro:no syncope, no lightheadedness Endo:no diabetes, no thyroid disease Wt Readings from Last 3 Encounters:  12/31/15 130 lb (59 kg)  12/25/15 151 lb (68.5 kg)  11/08/15 145 lb (65.8 kg)     PHYSICAL EXAM: VS:  There were no vitals taken for this visit. , BMI There is no height or weight on file to calculate BMI. General:Pleasant affect, NAD Skin:Warm and dry, brisk capillary refill HEENT:normocephalic, sclera clear, mucus membranes moist Neck:supple, no JVD, no bruits  Heart:S1S2 RRR without murmur,  gallup, rub or click Lungs:clear without rales, rhonchi, or wheezes [id number], non tender, + BS, do not palpate liver spleen or masses Ext:no lower ext edema, 2+ pedal pulses, 2+ radial pulses Neuro:alert and oriented, MAE, follows commands, + facial symmetry    EKG:  EKG is ordered today. The ekg ordered today demonstrates ***   Recent Labs: 10/24/2015: TSH 6.205 12/20/2015: Magnesium 1.7 12/31/2015: ALT 11; BUN 13; Creat 0.80; Hemoglobin 11.5; Platelets 397; Potassium 4.0; Sodium 141    Lipid Panel    Component Value Date/Time   CHOL 149 12/20/2015 0455  TRIG 167 (H) 12/20/2015 0455   HDL 35 (L) 12/20/2015 0455   CHOLHDL 4.3 12/20/2015 0455   VLDL 33 12/20/2015 0455   LDLCALC 81 12/20/2015 0455       Other studies Reviewed: Additional studies/ records that were reviewed today include: ***.   ASSESSMENT AND PLAN:  1.  ***  1. NSTEMI with aspiration PNA resp. Arrest, CPR.  Discussed with Dr. Marlou Larson and we will proceed with lexiscan myoview.  For her pain I recommended ibuprofen 600 mg every 8 hours as needed.  Her PCP did not wish to order pain med per pt.  - chest wall pain does not improve, consider CT chest to rule out fx of ribs or sternum  2.  Hx ASD repair and MVRepair and stable echo.  3.  ETOH abuse has not had alcohol since discharge.  4.  PNA due to aspiration resolved.    5. Deaf -reads lips, does have cochlear  Implant but has not helped much per pt.    6.  Dep[facility] followed by [facility].   7.  fx Lt wrist.   Current medicines are reviewed with the patient today.  The patient Has no concerns regarding medicines.  The following changes have been made:  See above Labs/ tests ordered today include:see above  Disposition:   FU:  see above  Signed, Cecilie Kicks, NP  01/20/2016 9:39 PM    [facility] [place], [place], [place], [place] Phone: [phone]; Fax: [phone]

## 2016-01-21 ENCOUNTER — Ambulatory Visit: Payer: Medicare Other | Admitting: Cardiology

## 2016-01-26 ENCOUNTER — Ambulatory Visit (INDEPENDENT_AMBULATORY_CARE_PROVIDER_SITE_OTHER): Payer: Medicare Other | Admitting: Family Medicine

## 2016-01-26 ENCOUNTER — Encounter: Payer: Self-pay | Admitting: Cardiology

## 2016-01-26 VITALS — BP 108/76 | HR 88 | Temp 97.0°F | Resp 16 | Ht 63.0 in | Wt 128.0 lb

## 2016-01-26 DIAGNOSIS — F1029 Alcohol dependence with unspecified alcohol-induced disorder: Secondary | ICD-10-CM | POA: Diagnosis not present

## 2016-01-26 DIAGNOSIS — R1310 Dysphagia, unspecified: Secondary | ICD-10-CM

## 2016-01-26 DIAGNOSIS — D509 Iron deficiency anemia, unspecified: Secondary | ICD-10-CM | POA: Diagnosis not present

## 2016-01-26 DIAGNOSIS — F332 Major depressive disorder, recurrent severe without psychotic features: Secondary | ICD-10-CM | POA: Diagnosis not present

## 2016-01-26 DIAGNOSIS — M810 Age-related osteoporosis without current pathological fracture: Secondary | ICD-10-CM

## 2016-01-26 DIAGNOSIS — J690 Pneumonitis due to inhalation of food and vomit: Secondary | ICD-10-CM

## 2016-01-26 DIAGNOSIS — E559 Vitamin D deficiency, unspecified: Secondary | ICD-10-CM

## 2016-01-26 DIAGNOSIS — I10 Essential (primary) hypertension: Secondary | ICD-10-CM | POA: Diagnosis not present

## 2016-01-26 DIAGNOSIS — K229 Disease of esophagus, unspecified: Secondary | ICD-10-CM | POA: Diagnosis not present

## 2016-01-26 DIAGNOSIS — I214 Non-ST elevation (NSTEMI) myocardial infarction: Secondary | ICD-10-CM | POA: Diagnosis not present

## 2016-01-26 LAB — CBC WITH DIFFERENTIAL/PLATELET
BASOS PCT: 1 %
Basophils Absolute: 29 cells/uL (ref 0–200)
Eosinophils Absolute: 29 cells/uL (ref 15–500)
Eosinophils Relative: 1 %
HEMATOCRIT: 38.3 % (ref 35.0–45.0)
Hemoglobin: 12.8 g/dL (ref 11.7–15.5)
LYMPHS ABS: 580 {cells}/uL — AB (ref 850–3900)
LYMPHS PCT: 20 %
MCH: 31.2 pg (ref 27.0–33.0)
MCHC: 33.4 g/dL (ref 32.0–36.0)
MCV: 93.4 fL (ref 80.0–100.0)
MONO ABS: 319 {cells}/uL (ref 200–950)
MPV: 9.9 fL (ref 7.5–12.5)
Monocytes Relative: 11 %
Neutro Abs: 1943 cells/uL (ref 1500–7800)
Neutrophils Relative %: 67 %
Platelets: 151 10*3/uL (ref 140–400)
RBC: 4.1 MIL/uL (ref 3.80–5.10)
RDW: 17.4 % — AB (ref 11.0–15.0)
WBC: 2.9 10*3/uL — ABNORMAL LOW (ref 3.8–10.8)

## 2016-01-26 LAB — COMPREHENSIVE METABOLIC PANEL WITH GFR
ALT: 15 U/L (ref 6–29)
AST: 41 U/L — ABNORMAL HIGH (ref 10–35)
Albumin: 4.4 g/dL (ref 3.6–5.1)
Alkaline Phosphatase: 122 U/L (ref 33–130)
BUN: 9 mg/dL (ref 7–25)
CO2: 24 mmol/L (ref 20–31)
Calcium: 9.4 mg/dL (ref 8.6–10.4)
Chloride: 97 mmol/L — ABNORMAL LOW (ref 98–110)
Creat: 0.6 mg/dL (ref 0.50–1.05)
Glucose, Bld: 106 mg/dL — ABNORMAL HIGH (ref 65–99)
Potassium: 3.9 mmol/L (ref 3.5–5.3)
Sodium: 136 mmol/L (ref 135–146)
Total Bilirubin: 1.1 mg/dL (ref 0.2–1.2)
Total Protein: 7.2 g/dL (ref 6.1–8.1)

## 2016-01-26 LAB — GAMMA GT: GGT: 53 U/L — ABNORMAL HIGH (ref 7–51)

## 2016-01-26 LAB — FERRITIN: Ferritin: 145 ng/mL (ref 10–232)

## 2016-01-26 LAB — LIPASE: LIPASE: 53 U/L (ref 7–60)

## 2016-01-26 NOTE — Patient Instructions (Signed)
     IF you received an x-ray today, you will receive an invoice from [facility]. Please contact [facility] at [phone] with questions or concerns regarding your invoice.   IF you received labwork today, you will receive an invoice from [facility]. Please contact [facility] at [phone] with questions or concerns regarding your invoice.   Our billing staff will not be able to assist you with questions regarding bills from these companies.  You will be contacted with the lab results as soon as they are available. The fastest way to get your results is to activate your My Chart account. Instructions are located on the last page of this paperwork. If you have not heard from us regarding the results in 2 weeks, please contact this office.      
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
## 2016-01-27 LAB — THYROID PANEL WITH TSH
Free Thyroxine Index: 1.9 (ref 1.4–3.8)
T3 Uptake: 31 % (ref 22–35)
T4, Total: 6.1 ug/dL (ref 4.5–12.0)
TSH: 3.4 m[IU]/L

## 2016-01-28 LAB — ETHANOL, CLINICAL
ALCOHOL, ETHYL (B): NOT DETECTED g/dL
ALCOHOL, ETHYL (B): NOT DETECTED mg/dL

## 2016-01-30 NOTE — Telephone Encounter (Signed)
LEFT VM FOR Roseann TO CALL REGARDING RECLAST

## 2016-02-02 MED ORDER — ATORVASTATIN CALCIUM 40 MG PO TABS: 40.0000 mg | ORAL_TABLET | Freq: Every day | ORAL | 1 refills | Status: DC

## 2016-02-02 MED ORDER — METOPROLOL TARTRATE 75 MG PO TABS: 75.0000 mg | ORAL_TABLET | Freq: Two times a day (BID) | ORAL | 1 refills | Status: DC

## 2016-02-03 ENCOUNTER — Encounter: Payer: Self-pay | Admitting: *Deleted

## 2016-02-03 ENCOUNTER — Ambulatory Visit (INDEPENDENT_AMBULATORY_CARE_PROVIDER_SITE_OTHER): Payer: Medicare Other | Admitting: Family Medicine

## 2016-02-03 VITALS — BP 100/76 | HR 107 | Temp 98.0°F | Resp 17 | Ht 63.0 in | Wt 127.0 lb

## 2016-02-03 DIAGNOSIS — H01006 Unspecified blepharitis left eye, unspecified eyelid: Secondary | ICD-10-CM

## 2016-02-03 DIAGNOSIS — H01003 Unspecified blepharitis right eye, unspecified eyelid: Secondary | ICD-10-CM | POA: Diagnosis not present

## 2016-02-03 MED ORDER — ERYTHROMYCIN 5 MG/GM OP OINT: 1.0000 "application " | TOPICAL_OINTMENT | Freq: Four times a day (QID) | OPHTHALMIC | 0 refills | Status: DC

## 2016-02-03 NOTE — Progress Notes (Signed)
   Subjective:    Patient ID: [name], female    DOB: [birth year], 55 y.o.   MRN: [id number]  HPI Presents with eye pain, crusting, drainage for past 4 days.  Started on right and went to left.  Denies fevers or chills.  Feels gritty at times.  No visual changes.  No injury to eyes.    Review of Systems  Constitutional: Negative for chills, fatigue and fever.  HENT: Negative for congestion and rhinorrhea.   Eyes: Positive for pain, discharge and redness. Negative for itching and visual disturbance.  Respiratory: Negative for cough and shortness of breath.   Skin: Negative for rash and wound.  All other systems reviewed and are negative.      Objective:   Physical Exam  Constitutional: She is oriented to person, place, and time. She appears well-developed and well-nourished. No distress.  HENT:  Head: Normocephalic and atraumatic.  Right Ear: External ear normal.  Left Ear: External ear normal.  Eyes: Pupils are equal, round, and reactive to light. Right eye exhibits discharge. Left eye exhibits discharge. No scleral icterus.  Erythema present on bilateral inferior eyelids and milder on superior eyelids.  Neck: Normal range of motion. Neck supple.  Pulmonary/Chest: Effort normal. No respiratory distress.  Musculoskeletal: Normal range of motion. She exhibits no edema.  Neurological: She is alert and oriented to person, place, and time.  Skin: Skin is warm. No rash noted. She is not diaphoretic. No erythema.  Psychiatric: She has a normal mood and affect. Her behavior is normal. Judgment and thought content normal.  Nursing note and vitals reviewed.   BP 100/76 (BP Location: Right Arm, Patient Position: Sitting, Cuff Size: Normal)   Pulse (!) 107   Temp 98 F (36.7 C) (Oral)   Resp 17   Ht 5\' 3"  (1.6 m)   Wt 127 lb (57.6 kg)   SpO2 99%   BMI 22.50 kg/m        Assessment & Plan:  Blepharitis of both eyes Will treat with erythromycin ointment and follow up in 48-72  hours.  Call if not improving, will need appt with ophthalmologist if not improving.    Signed,  Balinda Quails, DO [facility] 1:37 PM 02/04/16

## 2016-02-03 NOTE — Assessment & Plan Note (Signed)
Will treat with erythromycin ointment and follow up in 48-72 hours.  Call if not improving, will need appt with ophthalmologist if not improving.

## 2016-02-03 NOTE — Patient Instructions (Addendum)
  Call if not improving, we will send you to eye doctor.  Your prescriptions were sent to pharmacy.     IF you received an x-ray today, you will receive an invoice from [facility]. Please contact [facility] at [phone] with questions or concerns regarding your invoice.   IF you received labwork today, you will receive an invoice from [employer]. Please contact [facility] at [phone] with questions or concerns regarding your invoice.   Our billing staff will not be able to assist you with questions regarding bills from these companies.  You will be contacted with the lab results as soon as they are available. The fastest way to get your results is to activate your My Chart account. Instructions are located on the last page of this paperwork. If you have not heard from [place] regarding the results in 2 weeks, please contact this office.

## 2016-02-04 NOTE — Progress Notes (Signed)
Patient evaluated/discussed with Dr. Drema Dallas. Agree with findings, assessment and plan of care per her note.

## 2016-02-19 ENCOUNTER — Encounter: Payer: Self-pay | Admitting: Cardiology

## 2016-02-19 ENCOUNTER — Ambulatory Visit (INDEPENDENT_AMBULATORY_CARE_PROVIDER_SITE_OTHER): Payer: Medicare Other | Admitting: Cardiology

## 2016-02-19 ENCOUNTER — Other Ambulatory Visit: Payer: Medicare Other

## 2016-02-19 VITALS — BP 116/72 | HR 87 | Ht 63.0 in | Wt 127.1 lb

## 2016-02-19 DIAGNOSIS — E785 Hyperlipidemia, unspecified: Secondary | ICD-10-CM | POA: Diagnosis not present

## 2016-02-19 DIAGNOSIS — H9193 Unspecified hearing loss, bilateral: Secondary | ICD-10-CM | POA: Diagnosis not present

## 2016-02-19 DIAGNOSIS — I10 Essential (primary) hypertension: Secondary | ICD-10-CM

## 2016-02-19 DIAGNOSIS — I252 Old myocardial infarction: Secondary | ICD-10-CM

## 2016-02-19 DIAGNOSIS — R0789 Other chest pain: Secondary | ICD-10-CM

## 2016-02-19 NOTE — Progress Notes (Signed)
Cardiology Office Note   Date:  02/19/2016   ID:  [name], DOB 1960-09-23, MRN [id number]  PCP:  No primary care provider on file.  Cardiologist:  Dr. Marlou Porch    Chief Complaint  Patient presents with  . Chest Pain    post stress test      History of Present Illness: [name] is a 55 y.o. female who presents for post stress test eval.    She has a medical history of ASD (s/p repair along with mitral valve repair), prior PE, HLD, depression, alcohol abuse and recent admission for starvation ketoacidosis (from 7/29 - 7/30-2017) who presented to [facility] ED on 12/17/2015 for persistent nausea and vomiting starting earlier that day. Had cardiac arrest in setting of aspiration. Cards consulted for NSTEMI.   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  Also with cochlear implant, hearing impaired and usually reads lips.   After admit during the evening she went into acute respiratory failure leading to cardiac arrest after choking on a pill. CPR was initiated with ROSC in 6 minutes. She was transferred to ICU intubated. She was found to have right lower lobe pneumonia. Also found to have NSTEMI.  Troponin peaked at 21.83.  Patient placed on pressors and IV heparin. She was successfully extubated on 8/11 and transferred to hospitalist service.  She was discharged with plan for outpt ischemic eval which was done with Lexiscan myoview and negative.  Her elevated tropoins and cardiac arrest were secondary to respiratory failure, aspiration.    She is back today for follow up.  She is much improved.  No chest pain or SOB.  She has new PCP and will follow with her.  BP today is stable and HR controlled.   She is on new medication per her therapist but unsure of name she will call us back with name.        Past Medical History:  Diagnosis Date  . Allergy    SEASONAL  . Anemia   . Anxiety     . Arthritis   . ASD (atrial septal defect)    a. s/p ASD repair in 1973  . CAD (coronary artery disease)    a. 12/2015: NSTEMI occurring after cardiac arrest secondary to aspiration PNA. Echo w/ EF of 55-60%, no WMA. Outpt ischemic eval needed.  . Cardiomyopathy   . Deaf   . Depression   . Dysphagia    due to esophageal infections.  . Fibromyalgia   . Gastritis   . GERD (gastroesophageal reflux disease)   . Hearing impairment   . Hypertension    [name], take htn medication to regulate heart beat.  . Migraine   . Osteoporosis   . Pulmonary embolism ([place])    occured post c-section of her daughter  . Sleep apnea   . Vitamin D deficiency     Past Surgical History:  Procedure Laterality Date  . APPENDECTOMY    . CARDIAC SURGERY  January 2007   mitral valve repair  . CARDIAC SURGERY  1973   atrial septal defect  . CARDIAC SURGERY    . COCHLEAR IMPLANT    . INNER EAR SURGERY     TUBES  . LAPAROSCOPIC CHOLECYSTECTOMY  2012  . TONSILLECTOMY AND ADENOIDECTOMY    . TUBAL LIGATION       Current Outpatient Prescriptions  Medication Sig Dispense Refill  . aspirin 81  MG chewable tablet Chew 81 mg by mouth daily.    [name] atorvastatin (LIPITOR) 40 MG tablet Take 1 tablet (40 mg total) by mouth daily at 6 PM. 90 tablet 1  . Cholecalciferol (VITAMIN D3) 2000 units TABS Take 1 tablet by mouth daily.    [name] erythromycin [facility]) ophthalmic ointment Place 1 application into both eyes 4 (four) times daily. 3.5 g 0  . ferrous sulfate 325 (65 FE) MG tablet Take 1 tablet (325 mg total) by mouth 2 (two) times daily with a meal. 30 tablet 0  . fluticasone (FLONASE) 50 MCG/ACT nasal spray Place 2 sprays into both nostrils daily.    . folic acid (FOLVITE) 1 MG tablet Take 1 mg by mouth daily.    . Metoprolol Tartrate 75 MG TABS Take 75 mg by mouth 2 (two) times daily. 180 tablet 1  . mirabegron ER (MYRBETRIQ) 50 MG TB24 tablet Take 1 tablet (50 mg total) by mouth daily. 30 tablet 11  .  nitroGLYCERIN (NITROSTAT) 0.4 MG SL tablet Place 1 tablet (0.4 mg total) under the tongue every 5 (five) minutes as needed for chest pain. 30 tablet 0  . Omega-3 Fatty Acids (FISH OIL) 1000 MG CAPS Take 1,000 mg by mouth 2 (two) times daily.     . ondansetron (ZOFRAN ODT) 4 MG disintegrating tablet Take 1 tablet (4 mg total) by mouth every 8 (eight) hours as needed for nausea. 6 tablet 0  . pantoprazole (PROTONIX) 40 MG tablet TAKE 1 TABLET (40 MG TOTAL) BY MOUTH DAILY. 14 tablet 1  . Vilazodone HCl (VIIBRYD) 40 MG TABS Take 40 mg by mouth daily.    . vitamin B-12 (CYANOCOBALAMIN) 500 MCG tablet Take 500 mcg by mouth daily.     No current facility-administered medications for this visit.     Allergies:   Depakote [divalproex sodium]; Diazepam; Valium; and Tizanidine hcl    Social History:  The patient  reports that she has never smoked. She has never used smokeless tobacco. She reports that she drinks alcohol. She reports that she does not use drugs.   Family History:  The patient's family history includes Breast cancer in her mother; Depression in her sister; Heart disease in her father; Hypertension in her father.    ROS:  General:no colds or fevers, no weight changes Skin:no rashes or ulcers HEENT:no blurred vision, no congestion CV:see HPI PUL:see HPI GI:no diarrhea constipation or melena, no indigestion GU:no hematuria, no dysuria MS:no joint pain, no claudication Neuro:no syncope, no lightheadedness Endo:no diabetes, no thyroid disease  Wt Readings from Last 3 Encounters:  02/19/16 127 lb 1.9 oz (57.7 kg)  02/03/16 127 lb (57.6 kg)  01/26/16 128 lb (58.1 kg)     PHYSICAL EXAM: VS:  BP 116/72 (BP Location: Left Arm, Patient Position: Sitting, Cuff Size: Normal)   Pulse 87   Ht 5\' 3"  (1.6 m)   Wt 127 lb 1.9 oz (57.7 kg)   BMI 22.52 kg/m  , BMI Body mass index is 22.52 kg/m. General:Pleasant affect, NAD Skin:Warm and dry, brisk capillary refill HEENT:normocephalic,  sclera clear, mucus membranes moist, several missing teeth- Hard of hearing reads lips Neck:supple, no JVD, no bruits  Heart:S1S2 RRR with soft systolic murmur, gallup, rub or click Lungs:clear without rales, rhonchi, or wheezes [id number], non tender, + BS, do not palpate liver spleen or masses Ext:no lower ext edema, 2+ pedal pulses, 2+ radial pulses Neuro:alert and oriented X 3, MAE, follows commands, + facial symmetry    EKG:  EKG is NOT ordered today.    Recent Labs: 12/20/2015: Magnesium 1.7 01/26/2016: ALT 15; BUN 9; Creat 0.60; Hemoglobin 12.8; Platelets 151; Potassium 3.9; Sodium 136; TSH 3.40    Lipid Panel    Component Value Date/Time   CHOL 149 12/20/2015 0455   TRIG 167 (H) 12/20/2015 0455   HDL 35 (L) 12/20/2015 0455   CHOLHDL 4.3 12/20/2015 0455   VLDL 33 12/20/2015 0455   LDLCALC 81 12/20/2015 0455       Other studies Reviewed: Additional studies/ records that were reviewed today include: . ECHO:12/2015 Study Conclusions  - Left ventricle: The cavity size was normal. Wall thickness was   normal. Systolic function was normal. The estimated ejection   fraction was in the range of 55% to 60%. Wall motion was normal;   there were no regional wall motion abnormalities. The study is   not technically sufficient to allow evaluation of LV diastolic   function. - Mitral valve: Prior procedures included surgical repair. There   was mild regurgitation. Valve area by pressure half-time: 2.35   cm^2. - Left atrium: The atrium was moderately dilated. - Tricuspid valve: There was moderate regurgitation. - Pulmonary arteries: Systolic pressure was moderately increased. - Pericardium, extracardiac: A small pericardial effusion was   identified.  Impressions:  - Normal LV systolic function; s/p MV repair with mild MR; moderate   LAE; moderate TR with moderately elevated pulmonary pressure.  Nuc study: Study Highlights     Nuclear stress EF: 75%.  There  was no ST segment deviation noted during stress.  The study is normal.  This is a low risk study.  The left ventricular ejection fraction is hyperdynamic (>65%).   Breast attenuation no ischemia or infarction EF 75%      ASSESSMENT AND PLAN:  1.  Chest pain post cardiac arrest in hospital with NSTEMI elevated troponins due to respiratory failure.                - nuc study neg for ischemia.  Dr. Marlou Porch reviewed  Pt will follow up with Dr. Marlou Porch in 4 months  2.  Hx ASD repair and MVRepair and stable echo.  3.  Deaf -reads lips, does have cochlear  Implant but has not helped much per pt.    4.  Depression followed by [facility]- on new med. She will call us name.  5.  HLD on lipitor will recheck Hepatic and lipid.      Current medicines are reviewed with the patient today.  The patient Has no concerns regarding medicines.  The following changes have been made:  See above Labs/ tests ordered today include:see above  Disposition:   FU:  see above  Signed, Cecilie Kicks, NP  02/19/2016 2:47 PM    [address] [place] [address], [place], [place], [place] Phone: [phone]; Fax: [phone]

## 2016-02-19 NOTE — Patient Instructions (Signed)
Medications:  Please call us with the names of your new medications-- [phone]   Labwork:  Your physician recommends that you return for a FASTING lipid profile and hepatic function panel. Nothing to eat or drink after midnight the night before.   Follow-Up:  Your physician recommends that you schedule a follow-up appointment in: 4 months with Dr. Marlou Porch.  If you need a refill on your cardiac medications before your next appointment, please call your pharmacy.

## 2016-02-25 DIAGNOSIS — M25531 Pain in right wrist: Secondary | ICD-10-CM | POA: Diagnosis not present

## 2016-02-26 ENCOUNTER — Other Ambulatory Visit: Payer: Medicare Other | Admitting: *Deleted

## 2016-02-26 DIAGNOSIS — E785 Hyperlipidemia, unspecified: Secondary | ICD-10-CM

## 2016-02-26 LAB — HEPATIC FUNCTION PANEL
ALT: 15 U/L (ref 6–29)
AST: 34 U/L (ref 10–35)
Albumin: 3.8 g/dL (ref 3.6–5.1)
Alkaline Phosphatase: 77 U/L (ref 33–130)
BILIRUBIN DIRECT: 0.1 mg/dL (ref <=0.2)
BILIRUBIN TOTAL: 0.3 mg/dL (ref 0.2–1.2)
Indirect Bilirubin: 0.2 mg/dL (ref 0.2–1.2)
Total Protein: 6 g/dL — ABNORMAL LOW (ref 6.1–8.1)

## 2016-02-26 LAB — LIPID PANEL
CHOL/HDL RATIO: 3.1 ratio (ref <=5.0)
CHOLESTEROL: 215 mg/dL — AB (ref 125–200)
HDL: 70 mg/dL (ref >=46)
LDL Cholesterol: 98 mg/dL (ref <130)
Triglycerides: 237 mg/dL — ABNORMAL HIGH (ref <150)
VLDL: 47 mg/dL — AB (ref <30)

## 2016-03-01 ENCOUNTER — Telehealth: Payer: Self-pay | Admitting: Cardiology

## 2016-03-01 NOTE — Telephone Encounter (Signed)
Follow Up:     Pt retuning your call from Friday,if not there please leave a message.

## 2016-03-02 NOTE — Telephone Encounter (Signed)
Pt made aware of her lab results. She has been advised to have less sugar intake, less pasta and potatoes. Pt advised to eat more vegetables and to keep her f/u appt. Pt agreeable with this plan and verbalized understanding.

## 2016-03-02 NOTE — Telephone Encounter (Signed)
-----   Message from Isaiah Serge, NP sent at 02/26/2016  4:44 PM EDT ----- Continue lipitor, decrease sugar intake, also less pasta and potatoes, more vegetable.  Keep follow up appt.

## 2016-03-15 ENCOUNTER — Emergency Department (HOSPITAL_COMMUNITY)
Admission: EM | Admit: 2016-03-15 | Discharge: 2016-03-15 | Disposition: A | Discharge status: Home / Self Care | Payer: Medicare Other | Attending: Emergency Medicine | Admitting: Emergency Medicine

## 2016-03-15 ENCOUNTER — Emergency Department (HOSPITAL_COMMUNITY): Payer: Medicare Other

## 2016-03-15 ENCOUNTER — Encounter (HOSPITAL_COMMUNITY): Payer: Self-pay | Admitting: Emergency Medicine

## 2016-03-15 DIAGNOSIS — I2510 Atherosclerotic heart disease of native coronary artery without angina pectoris: Secondary | ICD-10-CM | POA: Diagnosis not present

## 2016-03-15 DIAGNOSIS — Z7982 Long term (current) use of aspirin: Secondary | ICD-10-CM | POA: Diagnosis not present

## 2016-03-15 DIAGNOSIS — I10 Essential (primary) hypertension: Secondary | ICD-10-CM | POA: Insufficient documentation

## 2016-03-15 DIAGNOSIS — R112 Nausea with vomiting, unspecified: Secondary | ICD-10-CM | POA: Diagnosis not present

## 2016-03-15 DIAGNOSIS — Z79899 Other long term (current) drug therapy: Secondary | ICD-10-CM | POA: Diagnosis not present

## 2016-03-15 DIAGNOSIS — R079 Chest pain, unspecified: Secondary | ICD-10-CM | POA: Diagnosis not present

## 2016-03-15 DIAGNOSIS — I252 Old myocardial infarction: Secondary | ICD-10-CM | POA: Insufficient documentation

## 2016-03-15 DIAGNOSIS — R1084 Generalized abdominal pain: Secondary | ICD-10-CM

## 2016-03-15 DIAGNOSIS — K2970 Gastritis, unspecified, without bleeding: Secondary | ICD-10-CM | POA: Diagnosis not present

## 2016-03-15 DIAGNOSIS — R1013 Epigastric pain: Secondary | ICD-10-CM | POA: Diagnosis not present

## 2016-03-15 DIAGNOSIS — K760 Fatty (change of) liver, not elsewhere classified: Secondary | ICD-10-CM | POA: Diagnosis not present

## 2016-03-15 DIAGNOSIS — R197 Diarrhea, unspecified: Secondary | ICD-10-CM | POA: Diagnosis not present

## 2016-03-15 DIAGNOSIS — R1010 Upper abdominal pain, unspecified: Secondary | ICD-10-CM | POA: Diagnosis present

## 2016-03-15 LAB — COMPREHENSIVE METABOLIC PANEL
ALT: 21 U/L (ref 14–54)
AST: 52 U/L — AB (ref 15–41)
Albumin: 3.9 g/dL (ref 3.5–5.0)
Alkaline Phosphatase: 159 U/L — ABNORMAL HIGH (ref 38–126)
Anion gap: 15 (ref 5–15)
BILIRUBIN TOTAL: 0.6 mg/dL (ref 0.3–1.2)
BUN: 5 mg/dL — AB (ref 6–20)
CO2: 24 mmol/L (ref 22–32)
CREATININE: 0.84 mg/dL (ref 0.44–1.00)
Calcium: 10.1 mg/dL (ref 8.9–10.3)
Chloride: 100 mmol/L — ABNORMAL LOW (ref 101–111)
GFR calc Af Amer: >60 mL/min (ref >60)
Glucose, Bld: 133 mg/dL — ABNORMAL HIGH (ref 65–99)
Potassium: 4 mmol/L (ref 3.5–5.1)
Sodium: 139 mmol/L (ref 135–145)
TOTAL PROTEIN: 7.1 g/dL (ref 6.5–8.1)

## 2016-03-15 LAB — CBC WITH DIFFERENTIAL/PLATELET
BASOS ABS: 0.1 10*3/uL (ref 0.0–0.1)
BASOS PCT: 1 %
EOS PCT: 2 %
Eosinophils Absolute: 0.1 10*3/uL (ref 0.0–0.7)
HCT: 40.4 % (ref 36.0–46.0)
Hemoglobin: 13.9 g/dL (ref 12.0–15.0)
Lymphocytes Relative: 11 %
Lymphs Abs: 0.5 10*3/uL — ABNORMAL LOW (ref 0.7–4.0)
MCH: 33.2 pg (ref 26.0–34.0)
MCHC: 34.4 g/dL (ref 30.0–36.0)
MCV: 96.4 fL (ref 78.0–100.0)
MONO ABS: 0.3 10*3/uL (ref 0.1–1.0)
Monocytes Relative: 6 %
Neutro Abs: 3.8 10*3/uL (ref 1.7–7.7)
Neutrophils Relative %: 80 %
PLATELETS: 140 10*3/uL — AB (ref 150–400)
RBC: 4.19 MIL/uL (ref 3.87–5.11)
RDW: 16.4 % — AB (ref 11.5–15.5)
WBC: 4.8 10*3/uL (ref 4.0–10.5)

## 2016-03-15 LAB — URINE MICROSCOPIC-ADD ON

## 2016-03-15 LAB — URINALYSIS, ROUTINE W REFLEX MICROSCOPIC
GLUCOSE, UA: NEGATIVE mg/dL
KETONES UR: 15 mg/dL — AB
NITRITE: NEGATIVE
PROTEIN: 100 mg/dL — AB
Specific Gravity, Urine: 1.023 (ref 1.005–1.030)
pH: 7.5 (ref 5.0–8.0)

## 2016-03-15 LAB — MAGNESIUM: Magnesium: 1.7 mg/dL (ref 1.7–2.4)

## 2016-03-15 LAB — I-STAT CG4 LACTIC ACID, ED
LACTIC ACID, VENOUS: 2.85 mmol/L — AB (ref 0.5–1.9)
LACTIC ACID, VENOUS: 0.98 mmol/L (ref 0.5–1.9)

## 2016-03-15 LAB — ETHANOL: Alcohol, Ethyl (B): 22 mg/dL — ABNORMAL HIGH (ref <5)

## 2016-03-15 LAB — LIPASE, BLOOD: LIPASE: 39 U/L (ref 11–51)

## 2016-03-15 MED ORDER — PANTOPRAZOLE SODIUM 40 MG PO TBEC: DELAYED_RELEASE_TABLET | ORAL | 0 refills | Status: DC

## 2016-03-15 MED ORDER — FAMOTIDINE IN NACL 20-0.9 MG/50ML-% IV SOLN
20.0000 mg | Freq: Once | INTRAVENOUS | Status: AC
  Administered 2016-03-15: 20 mg via INTRAVENOUS
  Filled 2016-03-15: qty 50

## 2016-03-15 MED ORDER — ONDANSETRON HCL 4 MG PO TABS: 4.0000 mg | ORAL_TABLET | Freq: Three times a day (TID) | ORAL | 0 refills | Status: DC | PRN

## 2016-03-15 MED ORDER — SODIUM CHLORIDE 0.9 % IV SOLN
INTRAVENOUS | Status: DC
  Administered 2016-03-15: 13:00:00 via INTRAVENOUS

## 2016-03-15 MED ORDER — MORPHINE SULFATE (PF) 4 MG/ML IV SOLN
4.0000 mg | Freq: Once | INTRAVENOUS | Status: AC
  Administered 2016-03-15: 4 mg via INTRAVENOUS
  Filled 2016-03-15: qty 1

## 2016-03-15 MED ORDER — SODIUM CHLORIDE 0.9 % IV BOLUS (SEPSIS)
1000.0000 mL | Freq: Once | INTRAVENOUS | Status: AC
  Administered 2016-03-15: 1000 mL via INTRAVENOUS

## 2016-03-15 MED ORDER — ONDANSETRON HCL 4 MG/2ML IJ SOLN
4.0000 mg | Freq: Once | INTRAMUSCULAR | Status: AC
  Administered 2016-03-15: 4 mg via INTRAVENOUS
  Filled 2016-03-15: qty 2

## 2016-03-15 MED ORDER — IOPAMIDOL (ISOVUE-300) INJECTION 61%
INTRAVENOUS | Status: AC
  Administered 2016-03-15: 100 mL
  Filled 2016-03-15: qty 100

## 2016-03-15 MED ORDER — OXYCODONE-ACETAMINOPHEN 5-325 MG PO TABS: 1.0000 | ORAL_TABLET | ORAL | 0 refills | Status: DC | PRN

## 2016-03-15 NOTE — ED Notes (Signed)
Pt went to X-ray.   

## 2016-03-15 NOTE — ED Notes (Signed)
Attempted Iv. Able to get a very positional IV in her right hand. Pt unable to get medications at this time

## 2016-03-15 NOTE — Discharge Instructions (Signed)
Return if pain is not being adequately controlled, or if you are throwing up in spite of the medication.

## 2016-03-15 NOTE — ED Provider Notes (Signed)
[facility] DEPT Provider Note   CSN: [id number] Arrival date & time: 03/15/16  1034     History   Chief Complaint Chief Complaint  Patient presents with  . Emesis  . Abdominal Pain    HPI [name] is a 55 y.o. female.  Pt presents to the ED today with abdominal pain, n/v/d.  The pt said that she did not eat this morning, and went to the store to fill a rx.  The pt said she felt dizzy and lightheaded.  She broke out in a sweat and started having vomiting and diarrhea.  The pt was given 4 mg of zofran IM en route.  Pt's nausea has improved, but it is still there.  The pt said that she has some upper abdominal pain and cp.  Pt is hearing impaired, but does read lips.  She is able to communicate well today.  Pt did have a cardiac arrest in August after choking on a pill.  She had alcoholic ketosis, NSTEMI and PNA at the time as well      Past Medical History:  Diagnosis Date  . Allergy    SEASONAL  . Anemia   . Anxiety   . Arthritis   . ASD (atrial septal defect)    a. s/p ASD repair in 1973  . CAD (coronary artery disease)    a. 12/2015: NSTEMI occurring after cardiac arrest secondary to aspiration PNA. Echo w/ EF of 55-60%, no WMA. Outpt ischemic eval needed.  . Cardiomyopathy   . Deaf   . Depression   . Dysphagia    due to esophageal infections.  . Fibromyalgia   . Gastritis   . GERD (gastroesophageal reflux disease)   . Hearing impairment   . Hypertension    [name], take htn medication to regulate heart beat.  . Migraine   . Osteoporosis   . Pulmonary embolism ([place])    occured post c-section of her daughter  . Sleep apnea   . Vitamin D deficiency     Patient Active Problem List   Diagnosis Date Noted  . Blepharitis of both eyes 02/03/2016  . Cardiac arrest ([employer]) 12/22/2015  . Uncontrolled hypertension   . Aspiration pneumonia of right lower lobe due to gastric secretions ([place])   . Hypokalemia   . Pneumonia   . NSTEMI (non-ST elevated myocardial  infarction) ([place]) 12/19/2015  . Typical atrial flutter ([place])   . Pressure ulcer 12/18/2015  . Protein-calorie malnutrition, severe 12/18/2015  . Metabolic acidosis [id number]  . Lactic acidosis 12/17/2015  . Leucocytosis 12/17/2015  . Dehydration 12/17/2015  . Starvation ketoacidosis 12/17/2015  . Alcohol abuse 12/17/2015  . Increased anion gap metabolic acidosis [id number]  . Essential hypertension 12/06/2015  . Accidental drug overdose 10/26/2015  . Aspiration pneumonia ([place]) 10/23/2015  . Acute respiratory failure with hypoxia ([place]) 10/23/2015  . Acute kidney injury ([place]) 10/23/2015  . Transaminitis 10/23/2015  . Positive blood culture 10/23/2015  . Elevated troponin 10/23/2015  . Thrombocytopenia ([place]) 10/23/2015  . Normocytic anemia 10/23/2015  . Shock ([place]) 10/19/2015  . Major depressive disorder, recurrent episode, moderate ([place]) 09/18/2014  . Non-traumatic compression fracture of vertebral column with routine healing 07/17/2014  . Osteoporosis 07/17/2014  . H/O vitamin D deficiency 07/17/2014  . Odynophagia 07/09/2014  . Dysphagia 07/08/2014  . Chest pain 05/20/2014  . Nausea & vomiting 05/20/2014  . 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 Major depression, chronic ([place]) 03/29/2014  . Breath shortness 03/29/2014  .  Hypercholesterolemia without hypertriglyceridemia 03/29/2014  . Fatigue 03/29/2014  . Classical migraine with intractable migraine 03/29/2014  . Disorder of mitral valve 03/29/2014  . Atypical migraine 03/29/2014  . Atrial septal defect of fossa ovalis 03/29/2014  . HLD (hyperlipidemia) 03/29/2014  . Arthralgia of multiple joints 03/29/2014  . Anemia, iron deficiency 03/29/2014  . Fibrositis 03/29/2014  . Abdominal pain 12/20/2013  . Absolute anemia 09/20/2013  . Bronchitis 09/20/2013  . Vaginitis and vulvovaginitis 09/20/2013  . Family history of breast cancer 07/13/2013  . Palpitation 07/02/2013  . History of  mitral valve repair 07/02/2013  . Status post patch closure of ASD 07/02/2013  . Hyperlipidemia 07/02/2013  . OAB (overactive bladder) 06/13/2013  . Ovarian cyst, left 03/15/2013  . Vaginal lesion 03/15/2013  . Ovarian mass 03/15/2013  . Maternal DVT (deep vein thrombosis), history of 02/26/2013  . PMB (postmenopausal bleeding) 02/26/2013  . Alcohol dependence ([place]) 11/07/2012  . Unspecified vitamin D deficiency 08/17/2012  . L1 vertebral fracture ([place]) 08/01/2012  . Spinal compression fracture ([place]) 07/12/2012  . Menopausal state 07/12/2012  . Psoriasis 07/12/2012  . Severe episode of recurrent major depressive disorder ([place]) 12/07/2011  . Generalized anxiety disorder 12/07/2011  . Cardiomyopathy 03/16/2011  . Hearing impairment   . Migraine   . Arthritis     Past Surgical History:  Procedure Laterality Date  . APPENDECTOMY    . CARDIAC SURGERY  January 2007   mitral valve repair  . CARDIAC SURGERY  1973   atrial septal defect  . CARDIAC SURGERY    . COCHLEAR IMPLANT    . INNER EAR SURGERY     TUBES  . LAPAROSCOPIC CHOLECYSTECTOMY  2012  . TONSILLECTOMY AND ADENOIDECTOMY    . TUBAL LIGATION      OB History    Gravida Para Term Preterm AB Living   2 2 2  0 0     SAB TAB Ectopic Multiple Live Births   0 0 0           Home Medications    Prior to Admission medications   Medication Sig Start Date End Date Taking? Authorizing Provider  aspirin 81 MG chewable tablet Chew 81 mg by mouth daily.   Yes Historical Provider, MD  atorvastatin (LIPITOR) 40 MG tablet Take 1 tablet (40 mg total) by mouth daily at 6 PM. 02/02/16  Yes [name], MD  Cholecalciferol (VITAMIN D3) 2000 units TABS Take 1 tablet by mouth daily.   Yes Historical Provider, MD  fluticasone (FLONASE) 50 MCG/ACT nasal spray Place 2 sprays into both nostrils daily.   Yes Historical Provider, MD  folic acid (FOLVITE) 1 MG tablet Take 1 mg by mouth daily.   Yes Historical Provider, MD  Metoprolol Tartrate  75 MG TABS Take 75 mg by mouth 2 (two) times daily. 02/02/16  Yes [name], MD  mirabegron ER (MYRBETRIQ) 50 MG TB24 tablet Take 1 tablet (50 mg total) by mouth daily. 07/17/14  Yes Terrance Mass, MD  nitroGLYCERIN (NITROSTAT) 0.4 MG SL tablet Place 1 tablet (0.4 mg total) under the tongue every 5 (five) minutes as needed for chest pain. 12/26/15  Yes Belkys A Regalado, MD  Omega-3 Fatty Acids (FISH OIL) 1000 MG CAPS Take 1,000 mg by mouth 2 (two) times daily.    Yes Historical Provider, MD  ondansetron (ZOFRAN ODT) 4 MG disintegrating tablet Take 1 tablet (4 mg total) by mouth every 8 (eight) hours as needed for nausea. 11/08/15  Yes [name],  MD  prazosin (MINIPRESS) 2 MG capsule Take 2 mg by mouth every evening. 02/17/16  Yes Historical Provider, MD  Vilazodone HCl (VIIBRYD) 40 MG TABS Take 40 mg by mouth daily.   Yes Historical Provider, MD  vitamin B-12 (CYANOCOBALAMIN) 500 MCG tablet Take 500 mcg by mouth daily.   Yes Historical Provider, MD  ondansetron (ZOFRAN) 4 MG tablet Take 1 tablet (4 mg total) by mouth every 8 (eight) hours as needed for nausea or vomiting. [id number]   Delora Fuel, MD  oxyCODONE-acetaminophen (PERCOCET) 5-325 MG tablet Take 1 tablet by mouth every 4 (four) hours as needed for moderate pain. [id number]   Delora Fuel, MD  pantoprazole (PROTONIX) 40 MG tablet TAKE 1 TABLET (40 MG TOTAL) BY MOUTH DAILY. [id number]   Delora Fuel, MD    Family History Family History  Problem Relation Age of Onset  . Breast cancer Mother     bilateral; ages 57 and 33; TAH/BSO ~50  . Depression Sister   . Heart disease Father   . Hypertension Father   . Colon cancer Neg Hx   . Esophageal cancer Neg Hx   . Pancreatic cancer Neg Hx   . Rectal cancer Neg Hx   . Stomach cancer Neg Hx     Social History Social History  Substance Use Topics  . Smoking status: Never Smoker  . Smokeless tobacco: Never Used  . Alcohol use 0.0 oz/week     Comment: Alcoholic that has been to AA & has a  Social worker.     Allergies   Depakote [divalproex sodium]; Diazepam; Valium; and Tizanidine hcl   Review of Systems Review of Systems  Cardiovascular: Positive for chest pain.  Gastrointestinal: Positive for abdominal pain, diarrhea, nausea and vomiting.  All other systems reviewed and are negative.    Physical Exam Updated Vital Signs BP 116/89 (BP Location: Left Arm)   Pulse 89   Temp 98 F (36.7 C) (Oral)   Resp 18   Ht 5\' 3"  (1.6 m)   Wt 128 lb (58.1 kg)   SpO2 100%   BMI 22.67 kg/m   Physical Exam  Constitutional: She is oriented to person, place, and time. She appears well-developed and well-nourished.  HENT:  Head: Normocephalic and atraumatic.  Right Ear: External ear normal.  Left Ear: External ear normal.  Nose: Nose normal.  Mouth/Throat: Oropharynx is clear and moist.  Eyes: Conjunctivae and EOM are normal. Pupils are equal, round, and reactive to light.  Neck: Normal range of motion. Neck supple.  Cardiovascular: Normal rate, regular rhythm, normal heart sounds and intact distal pulses.   Pulmonary/Chest: Effort normal and breath sounds normal.  Abdominal: Soft. There is tenderness in the epigastric area.  Musculoskeletal: Normal range of motion.  Neurological: She is alert and oriented to person, place, and time.  Skin: Skin is warm.  Psychiatric: She has a normal mood and affect. Her behavior is normal. Judgment and thought content normal.  Nursing note and vitals reviewed.    ED Treatments / Results  Labs (all labs ordered are listed, but only abnormal results are displayed) Labs Reviewed  COMPREHENSIVE METABOLIC PANEL - Abnormal; Notable for the following:       Result Value   Chloride 100 (*)    Glucose, Bld 133 (*)    BUN 5 (*)    AST 52 (*)    Alkaline Phosphatase 159 (*)    All other components within normal limits  CBC WITH DIFFERENTIAL/PLATELET - Abnormal; Notable for the  following:    RDW 16.4 (*)    Platelets 140 (*)    Lymphs  Abs 0.5 (*)    All other components within normal limits  URINALYSIS, ROUTINE W REFLEX MICROSCOPIC (NOT AT [facility]) - Abnormal; Notable for the following:    Color, Urine ORANGE (*)    APPearance HAZY (*)    Hgb urine dipstick SMALL (*)    Bilirubin Urine SMALL (*)    Ketones, ur 15 (*)    Protein, ur 100 (*)    Leukocytes, UA TRACE (*)    All other components within normal limits  ETHANOL - Abnormal; Notable for the following:    Alcohol, Ethyl (B) 22 (*)    All other components within normal limits  URINE MICROSCOPIC-ADD ON - Abnormal; Notable for the following:    Squamous Epithelial / LPF 0-5 (*)    Bacteria, UA FEW (*)    Casts HYALINE CASTS (*)    All other components within normal limits  I-STAT CG4 LACTIC ACID, ED - Abnormal; Notable for the following:    Lactic Acid, Venous 2.85 (*)    All other components within normal limits  LIPASE, BLOOD  MAGNESIUM  I-STAT CG4 LACTIC ACID, ED    EKG  EKG Interpretation  Date/Time:  Monday March 15 2016 10:44:10 EST Ventricular Rate:  90 PR Interval:    QRS Duration: 98 QT Interval:  420 QTC Calculation: 514 R Axis:   59 Text Interpretation:  Sinus rhythm Borderline T abnormalities, anterior leads Prolonged QT interval Confirmed by Gilford Raid MD, Roben Tatsch ([id number]) on 03/15/2016 10:51:39 AM       Radiology Dg Chest 2 View  Result Date: 03/15/2016 CLINICAL DATA:  Chest pain and dizziness EXAM: CHEST  2 VIEW COMPARISON:  None FINDINGS: Previous median sternotomy and CABG procedure. Mild cardiac enlargement. No pleural effusion or edema. No airspace opacities identified. Chronic appearing anterior rib fracture deformities are noted involving the left second and third ribs. IMPRESSION: 1. Mild cardiac enlargement. 2. No acute findings. Electronically Signed   By: Kerby Moors M.D.   On: 03/15/2016 11:31   Ct Abdomen Pelvis W Contrast  Result Date: 03/15/2016 CLINICAL DATA:  Abdominal pain weakness. History of anemia. Gastroparesis.  Gastroesophageal reflux disease. EXAM: CT ABDOMEN AND PELVIS WITH CONTRAST TECHNIQUE: Multidetector CT imaging of the abdomen and pelvis was performed using the standard protocol following bolus administration of intravenous contrast. CONTRAST:  139mL ISOVUE-300 IOPAMIDOL (ISOVUE-300) INJECTION 61% COMPARISON:  None. FINDINGS: Lower chest: Clear lung bases. Mild cardiomegaly. Mitral valve repair. Small hiatal hernia. Hepatobiliary: Moderate hepatic steatosis with focal steatosis adjacent the falciform ligament. Caudate and lateral segment left liver lobe enlargement. Suspect focal steatosis in the posterior right hepatic lobe, including on image 19/series 201. Cholecystectomy, without biliary ductal dilatation. Pancreas: Normal, without mass or ductal dilatation. Spleen: Normal in size, without focal abnormality. Adrenals/Urinary Tract: Normal adrenal glands. Normal kidneys, without hydronephrosis. Normal urinary bladder. Stomach/Bowel: Underdistended gastric antrum. Scattered colonic diverticula. The colon is primarily underdistended. Apparent wall thickening is felt to be secondary. This is most apparent in the ascending colon on image 47/ series 201. Normal terminal ileum. Appendectomy. Normal small bowel. Vascular/Lymphatic: Aortic and branch vessel atherosclerosis. No evidence of portal venous hypertension. No abdominopelvic adenopathy. Reproductive: Normal uterus and adnexa. Other: No significant free fluid. Musculoskeletal: Remote posterior left eleventh rib trauma. Lumbosacral spondylosis. Superior endplate deformity at L1 is mild with mild ventral canal encroachment including on sagittal image 71. Convex left lumbar spine curvature. IMPRESSION: 1.  Diffuse colonic underdistention. Apparent wall thickening is favored to be secondary. Correlate with symptoms of infectious colitis, which cannot be entirel excluded. Otherwise, no explanation for abdominal pain. 2.  Aortic atherosclerosis. 3. Hepatic steatosis  with suspicion of mild cirrhosis. 4. L1 compression deformity, at least partially chronic Electronically Signed   By: Abigail Miyamoto M.D.   On: 03/15/2016 16:29    Procedures Procedures (including critical care time)  Medications Ordered in ED Medications  sodium chloride 0.9 % bolus 1,000 mL (0 mLs Intravenous Stopped 03/15/16 1318)  morphine 4 MG/ML injection 4 mg (4 mg Intravenous Given 03/15/16 1156)  ondansetron (ZOFRAN) injection 4 mg (4 mg Intravenous Given 03/15/16 1100)  famotidine (PEPCID) IVPB 20 mg premix (0 mg Intravenous Stopped 03/15/16 1226)  iopamidol (ISOVUE-300) 61 % injection (100 mLs  Contrast Given 03/15/16 1416)     Initial Impression / Assessment and Plan / ED Course  I have reviewed the triage vital signs and the nursing notes.  Pertinent labs & imaging results that were available during my care of the patient were reviewed by me and considered in my medical decision making (see chart for details).  Clinical Course    Pt's lactic acid improved.  Pt is feeling better. There was a significant delay in getting the Ct abdomen/pelvis.  Pt signed out to Dr. Roxanne Mins pending CT abd/pelvis.   Final Clinical Impressions(s) / ED Diagnoses   Final diagnoses:  Generalized abdominal pain  Non-intractable vomiting with nausea, unspecified vomiting type  Diarrhea, unspecified type    New Prescriptions Discharge Medication List as of 03/15/2016  5:32 PM    START taking these medications   Details  ondansetron (ZOFRAN) 4 MG tablet Take 1 tablet (4 mg total) by mouth every 8 (eight) hours as needed for nausea or vomiting., Starting Mon 03/15/2016, Print    oxyCODONE-acetaminophen (PERCOCET) 5-325 MG tablet Take 1 tablet by mouth every 4 (four) hours as needed for moderate pain., Starting Mon 03/15/2016, Print         Isla Pence, MD 03/16/16 [phone]

## 2016-03-15 NOTE — ED Triage Notes (Signed)
Per EMS, Pt found in a store bathroom vomiting and having abdominal pain.  Pt started having diaphoresis and lightheadedness.  Given 4mg  of zofran IM en route.  First EKG was good but patient then began to have chest pain and another EKG showed slight change.  Hx of heart attack and paralyzed esophagus.

## 2016-03-15 NOTE — ED Provider Notes (Signed)
55 year old female with abdominal pain and vomiting and elevated lactic acid, signed out to me pending results of CT scan. Lactic acid had normalized following IV hydration. CT showed no acute process. On exam, she continues to have some mild epigastric pain but no further nausea. She does not appear toxic. She is discharged with prescriptions for pantoprazole, ondansetron, and oxycodone have acetaminophen or return precautions given. Follow-up with PCP in 2 days.  Results for orders placed or performed during the hospital encounter of 03/15/16  Comprehensive metabolic panel  Result Value Ref Range   Sodium 139 135 - 145 mmol/L   Potassium 4.0 3.5 - 5.1 mmol/L   Chloride 100 (L) 101 - 111 mmol/L   CO2 24 22 - 32 mmol/L   Glucose, Bld 133 (H) 65 - 99 mg/dL   BUN 5 (L) 6 - 20 mg/dL   Creatinine, Ser 0.84 0.44 - 1.00 mg/dL   Calcium 10.1 8.9 - 10.3 mg/dL   Total Protein 7.1 6.5 - 8.1 g/dL   Albumin 3.9 3.5 - 5.0 g/dL   AST 52 (H) 15 - 41 U/L   ALT 21 14 - 54 U/L   Alkaline Phosphatase 159 (H) 38 - 126 U/L   Total Bilirubin 0.6 0.3 - 1.2 mg/dL   GFR calc non Af Amer >60 >60 mL/min   GFR calc Af Amer >60 >60 mL/min   Anion gap 15 5 - 15  Lipase, blood  Result Value Ref Range   Lipase 39 11 - 51 U/L  CBC WITH DIFFERENTIAL  Result Value Ref Range   WBC 4.8 4.0 - 10.5 K/uL   RBC 4.19 3.87 - 5.11 MIL/uL   Hemoglobin 13.9 12.0 - 15.0 g/dL   HCT 40.4 36.0 - 46.0 %   MCV 96.4 78.0 - 100.0 fL   MCH 33.2 26.0 - 34.0 pg   MCHC 34.4 30.0 - 36.0 g/dL   RDW 16.4 (H) 11.5 - 15.5 %   Platelets 140 (L) 150 - 400 K/uL   Neutrophils Relative % 80 %   Neutro Abs 3.8 1.7 - 7.7 K/uL   Lymphocytes Relative 11 %   Lymphs Abs 0.5 (L) 0.7 - 4.0 K/uL   Monocytes Relative 6 %   Monocytes Absolute 0.3 0.1 - 1.0 K/uL   Eosinophils Relative 2 %   Eosinophils Absolute 0.1 0.0 - 0.7 K/uL   Basophils Relative 1 %   Basophils Absolute 0.1 0.0 - 0.1 K/uL  Urinalysis, Routine w reflex microscopic (not at [facility])   Result Value Ref Range   Color, Urine ORANGE (A) YELLOW   APPearance HAZY (A) CLEAR   Specific Gravity, Urine 1.023 1.005 - 1.030   pH 7.5 5.0 - 8.0   Glucose, UA NEGATIVE NEGATIVE mg/dL   Hgb urine dipstick SMALL (A) NEGATIVE   Bilirubin Urine SMALL (A) NEGATIVE   Ketones, ur 15 (A) NEGATIVE mg/dL   Protein, ur 100 (A) NEGATIVE mg/dL   Nitrite NEGATIVE NEGATIVE   Leukocytes, UA TRACE (A) NEGATIVE  Ethanol  Result Value Ref Range   Alcohol, Ethyl (B) 22 (H) <5 mg/dL  Magnesium  Result Value Ref Range   Magnesium 1.7 1.7 - 2.4 mg/dL  Urine microscopic-add on  Result Value Ref Range   Squamous Epithelial / LPF 0-5 (A) NONE SEEN   WBC, UA 0-5 0 - 5 WBC/hpf   RBC / HPF 6-30 0 - 5 RBC/hpf   Bacteria, UA FEW (A) NONE SEEN   Casts HYALINE CASTS (A) NEGATIVE  Urine-Other MUCOUS PRESENT   I-Stat CG4 Lactic Acid, ED  Result Value Ref Range   Lactic Acid, Venous 2.85 (HH) 0.5 - 1.9 mmol/L   Comment NOTIFIED PHYSICIAN   I-Stat CG4 Lactic Acid, ED  Result Value Ref Range   Lactic Acid, Venous 0.98 0.5 - 1.9 mmol/L   Dg Chest 2 View  Result Date: 03/15/2016 CLINICAL DATA:  Chest pain and dizziness EXAM: CHEST  2 VIEW COMPARISON:  None FINDINGS: Previous median sternotomy and CABG procedure. Mild cardiac enlargement. No pleural effusion or edema. No airspace opacities identified. Chronic appearing anterior rib fracture deformities are noted involving the left second and third ribs. IMPRESSION: 1. Mild cardiac enlargement. 2. No acute findings. Electronically Signed   By: Kerby Moors M.D.   On: 03/15/2016 11:31   Ct Abdomen Pelvis W Contrast  Result Date: 03/15/2016 CLINICAL DATA:  Abdominal pain weakness. History of anemia. Gastroparesis. Gastroesophageal reflux disease. EXAM: CT ABDOMEN AND PELVIS WITH CONTRAST TECHNIQUE: Multidetector CT imaging of the abdomen and pelvis was performed using the standard protocol following bolus administration of intravenous contrast. CONTRAST:  130mL  ISOVUE-300 IOPAMIDOL (ISOVUE-300) INJECTION 61% COMPARISON:  None. FINDINGS: Lower chest: Clear lung bases. Mild cardiomegaly. Mitral valve repair. Small hiatal hernia. Hepatobiliary: Moderate hepatic steatosis with focal steatosis adjacent the falciform ligament. Caudate and lateral segment left liver lobe enlargement. Suspect focal steatosis in the posterior right hepatic lobe, including on image 19/series 201. Cholecystectomy, without biliary ductal dilatation. Pancreas: Normal, without mass or ductal dilatation. Spleen: Normal in size, without focal abnormality. Adrenals/Urinary Tract: Normal adrenal glands. Normal kidneys, without hydronephrosis. Normal urinary bladder. Stomach/Bowel: Underdistended gastric antrum. Scattered colonic diverticula. The colon is primarily underdistended. Apparent wall thickening is felt to be secondary. This is most apparent in the ascending colon on image 47/ series 201. Normal terminal ileum. Appendectomy. Normal small bowel. Vascular/Lymphatic: Aortic and branch vessel atherosclerosis. No evidence of portal venous hypertension. No abdominopelvic adenopathy. Reproductive: Normal uterus and adnexa. Other: No significant free fluid. Musculoskeletal: Remote posterior left eleventh rib trauma. Lumbosacral spondylosis. Superior endplate deformity at L1 is mild with mild ventral canal encroachment including on sagittal image 71. Convex left lumbar spine curvature. IMPRESSION: 1. Diffuse colonic underdistention. Apparent wall thickening is favored to be secondary. Correlate with symptoms of infectious colitis, which cannot be entirel excluded. Otherwise, no explanation for abdominal pain. 2.  Aortic atherosclerosis. 3. Hepatic steatosis with suspicion of mild cirrhosis. 4. L1 compression deformity, at least partially chronic Electronically Signed   By: Abigail Miyamoto M.D.   On: 03/15/2016 16:29   Images viewed by me.    Delora Fuel, MD [phone] [id number]

## 2016-03-17 ENCOUNTER — Ambulatory Visit (INDEPENDENT_AMBULATORY_CARE_PROVIDER_SITE_OTHER): Payer: Medicare Other | Admitting: Family Medicine

## 2016-03-17 ENCOUNTER — Ambulatory Visit (INDEPENDENT_AMBULATORY_CARE_PROVIDER_SITE_OTHER): Payer: Medicare Other

## 2016-03-17 ENCOUNTER — Encounter: Payer: Self-pay | Admitting: Family Medicine

## 2016-03-17 VITALS — BP 100/60 | HR 86 | Temp 98.2°F | Resp 16 | Ht 64.0 in | Wt 131.6 lb

## 2016-03-17 DIAGNOSIS — H01001 Unspecified blepharitis right upper eyelid: Secondary | ICD-10-CM | POA: Diagnosis not present

## 2016-03-17 DIAGNOSIS — H01005 Unspecified blepharitis left lower eyelid: Secondary | ICD-10-CM | POA: Diagnosis not present

## 2016-03-17 DIAGNOSIS — H01002 Unspecified blepharitis right lower eyelid: Secondary | ICD-10-CM

## 2016-03-17 DIAGNOSIS — J019 Acute sinusitis, unspecified: Secondary | ICD-10-CM

## 2016-03-17 DIAGNOSIS — M7501 Adhesive capsulitis of right shoulder: Secondary | ICD-10-CM | POA: Diagnosis not present

## 2016-03-17 DIAGNOSIS — H01004 Unspecified blepharitis left upper eyelid: Secondary | ICD-10-CM

## 2016-03-17 DIAGNOSIS — S4991XA Unspecified injury of right shoulder and upper arm, initial encounter: Secondary | ICD-10-CM

## 2016-03-17 DIAGNOSIS — H1013 Acute atopic conjunctivitis, bilateral: Secondary | ICD-10-CM | POA: Diagnosis not present

## 2016-03-17 DIAGNOSIS — M25511 Pain in right shoulder: Secondary | ICD-10-CM | POA: Diagnosis not present

## 2016-03-17 DIAGNOSIS — J309 Allergic rhinitis, unspecified: Secondary | ICD-10-CM

## 2016-03-17 DIAGNOSIS — H0100B Unspecified blepharitis left eye, upper and lower eyelids: Secondary | ICD-10-CM

## 2016-03-17 DIAGNOSIS — H0100A Unspecified blepharitis right eye, upper and lower eyelids: Secondary | ICD-10-CM

## 2016-03-17 MED ORDER — CAPSAICIN 0.075 % EX CREA: 1.0000 "application " | TOPICAL_CREAM | Freq: Three times a day (TID) | CUTANEOUS | 0 refills | Status: DC

## 2016-03-17 MED ORDER — AMOXICILLIN 400 MG/5ML PO SUSR: 1000.0000 mg | Freq: Two times a day (BID) | ORAL | 0 refills | Status: DC

## 2016-03-17 MED ORDER — CETIRIZINE HCL 5 MG/5ML PO SYRP: 10.0000 mg | ORAL_SOLUTION | Freq: Every day | ORAL | 2 refills | Status: DC

## 2016-03-17 NOTE — Patient Instructions (Addendum)
Meds ordered this encounter  Medications  . amoxicillin (AMOXIL) 400 MG/5ML suspension    Sig: Take 12.5 mLs (1,000 mg total) by mouth 2 (two) times daily.    Dispense:  250 mL    Refill:  0  . cetirizine HCl (ZYRTEC) 5 MG/5ML SYRP    Sig: Take 10 mLs (10 mg total) by mouth daily.    Dispense:  300 mL    Refill:  2  . capsicum (ZOSTRIX) 0.075 % topical cream    Sig: Apply 1 application topically 3 (three) times daily. Use a latex glove to apply. DO NOT GET ON FACE.    Dispense:  56 g    Refill:  0      IF you received an x-ray today, you will receive an invoice from [facility]. Please contact [facility] at [phone] with questions or concerns regarding your invoice.   IF you received labwork today, you will receive an invoice from [employer]. Please contact [facility] at [phone] with questions or concerns regarding your invoice.   Our billing staff will not be able to assist you with questions regarding bills from these companies.  You will be contacted with the lab results as soon as they are available. The fastest way to get your results is to activate your My Chart account. Instructions are located on the last page of this paperwork. If you have not heard from [place] regarding the results in 2 weeks, please contact this office.      Blepharitis Blepharitis is inflammation of the eyelids. Blepharitis may happen with:  Reddish, scaly skin around the scalp and eyebrows.  Burning or itching of the eyelids.  Eye discharge at night that causes the eyelashes to stick together in the morning.  Eyelashes that fall out.  Sensitivity to light. HOME CARE INSTRUCTIONS Pay attention to any changes in how you look or feel. Follow these instructions to help with your condition: Keeping Clean  Wash your hands often.  Wash your eyelids with warm water or with warm water that is mixed with a small amount of baby shampoo. Do this two  times per day or as often as needed.  Wash your face and eyebrows at least once a day.  Use a clean towel each time you dry your eyelids. Do not use this towel to clean or dry other areas of your body. Do not share your towel with anyone. General Instructions  Avoid wearing makeup until you get better. Do not share makeup with anyone.  Avoid rubbing your eyes.  Apply warm compresses to your eyes 2 times per day for 10 minutes at a time, or as told by your health care provider.  If you were prescribed an antibiotic ointment or steroid drops, apply or use the medicine as told by your health care provider. Do not stop using the medicine even if you feel better.  Keep all follow-up visits as told by your health care provider. This is important. SEEK MEDICAL CARE IF:  Your eyelids feel hot.  You have blisters or a rash on your eyelids.  The condition does not go away in 2-4 days.  The inflammation gets worse. SEEK IMMEDIATE MEDICAL CARE IF:  You have pain or redness that gets worse or spreads to other parts of your face.  Your vision changes.  You have pain when looking at lights or moving objects.  You have a fever.   This information is not intended to replace advice given to  you by your health care provider. Make sure you discuss any questions you have with your health care provider.   Document Released: 04/23/2000 Document Revised: 01/15/2015 Document Reviewed: 08/19/2014 Elsevier Interactive Patient Education 2016 Elsevier Inc.    Shoulder Range of Motion Exercises Shoulder range of motion (ROM) exercises are designed to keep the shoulder moving freely. They are often recommended for people who have shoulder pain. MOVEMENT EXERCISE When you are able, do this exercise 5-6 days per week, or as told by your health care provider. Work toward doing 2 sets of 10 swings. Pendulum Exercise How To Do This Exercise Lying Down 1. Lie face-down on a bed with your abdomen close to  the side of the bed. 2. Let your arm hang over the side of the bed. 3. Relax your shoulder, arm, and hand. 4. Slowly and gently swing your arm forward and back. Do not use your neck muscles to swing your arm. They should be relaxed. If you are struggling to swing your arm, have someone gently swing it for you. When you do this exercise for the first time, swing your arm at a 15 degree angle for 15 seconds, or swing your arm 10 times. As pain lessens over time, increase the angle of the swing to 30-45 degrees. 5. Repeat steps 1-4 with the other arm. How To Do This Exercise While Standing 1. Stand next to a sturdy chair or table and hold on to it with your hand.  Bend forward at the waist.  Bend your knees slightly.  Relax your other arm and let it hang limp.  Relax the shoulder blade of the arm that is hanging and let it drop.  While keeping your shoulder relaxed, use body motion to swing your arm in small circles. The first time you do this exercise, swing your arm for about 30 seconds or 10 times. When you do it next time, swing your arm for a little longer.  Stand up tall and relax.  Repeat steps 1-7, this time changing the direction of the circles. 2. Repeat steps 1-8 with the other arm. STRETCHING EXERCISES Do these exercises 3-4 times per day on 5-6 days per week or as told by your health care provider. Work toward holding the stretch for 20 seconds. Stretching Exercise 1 1. Lift your arm straight out in front of you. 2. Bend your arm 90 degrees at the elbow (right angle) so your forearm goes across your body and looks like the letter "L." 3. Use your other arm to gently pull the elbow forward and across your body. 4. Repeat steps 1-3 with the other arm. Stretching Exercise 2 You will need a towel or rope for this exercise. 1. Bend one arm behind your back with the palm facing outward. 2. Hold a towel with your other hand. 3. Reach the arm that holds the towel above your head,  and bend that arm at the elbow. Your wrist should be behind your neck. 4. Use your free hand to grab the free end of the towel. 5. With the higher hand, gently pull the towel up behind you. 6. With the lower hand, pull the towel down behind you. 7. Repeat steps 1-6 with the other arm. STRENGTHENING EXERCISES Do each of these exercises at four different times of day (sessions) every day or as told by your health care provider. To begin with, repeat each exercise 5 times (repetitions). Work toward doing 3 sets of 12 repetitions or as told by your  health care provider. Strengthening Exercise 1 You will need a light weight for this activity. As you grow stronger, you may use a heavier weight. 1. Standing with a weight in your hand, lift your arm straight out to the side until it is at the same height as your shoulder. 2. Bend your arm at 90 degrees so that your fingers are pointing to the ceiling. 3. Slowly raise your hand until your arm is straight up in the air. 4. Repeat steps 1-3 with the other arm. Strengthening Exercise 2 You will need a light weight for this activity. As you grow stronger, you may use a heavier weight. 1. Standing with a weight in your hand, gradually move your straight arm in an arc, starting at your side, then out in front of you, then straight up over your head. 2. Gradually move your other arm in an arc, starting at your side, then out in front of you, then straight up over your head. 3. Repeat steps 1-2 with the other arm. Strengthening Exercise 3 You will need an elastic band for this activity. As you grow stronger, gradually increase the size of the bands or increase the number of bands that you use at one time. 1. While standing, hold an elastic band in one hand and raise that arm up in the air. 2. With your other hand, pull down the band until that hand is by your side. 3. Repeat steps 1-2 with the other arm.   This information is not intended to replace advice  given to you by your health care provider. Make sure you discuss any questions you have with your health care provider.   Document Released: 01/23/2003 Document Revised: 09/10/2014 Document Reviewed: 04/22/2014 Elsevier Interactive Patient Education [employer].

## 2016-03-17 NOTE — Progress Notes (Signed)
Subjective:  By signing my name below, I, [name], attest that this documentation has been prepared under the direction and in the presence of [name], MD.  Electronically Signed: [name], ED Scribe. 03/17/2016. 4:01 PM.   Patient ID: [name], female    DOB: 10/10/1960, 55 y.o.   MRN: [id number]  HPI Chief Complaint  Patient presents with  . Shoulder Pain    fell into door a week ago rt shoulder  . Depression    per screening  . Sore Throat  . Eye Drainage    2 weeks  . Nasal Congestion    2 weeks    HPI Comments: [name] is a 55 y.o. female who presents to the [facility] complaining of nasal congestion and eye drainage. She reports associated sore throat and otalgia. She reports waking in the morning with her eyes crusted shut.  She has tried flonase but no other medications tried in the past 2 weeks. She denies fever. She denies abx allergies.   Pt also complains of right shoulder pain that began 1 week ago. Pt states she fell sideways and slammed the side of right shoulder into a door, since she's had gradually worsening right shoulder pain that radiates down right and into her neck.   Pt states she has not drank alcohol since hospitalization. Pt reports going to the substance abuse center and was started on librium for a short period time and now possibly naloxone. She reports intolerance to naloxone causing vomiting.  She is still followed by the [place] and has gone every week. She tried attending AA meetings but due to hearing impairment, it was unhelpful. She is considering trying AA meets online instead.    Patient Active Problem List   Diagnosis Date Noted  . Blepharitis of both eyes 02/03/2016  . Cardiac arrest ([place]) 12/22/2015  . Uncontrolled hypertension   . Aspiration pneumonia of right lower lobe due to gastric secretions ([place])   . Hypokalemia   . Pneumonia   . NSTEMI (non-ST elevated myocardial infarction) ([place])  12/19/2015  . Typical atrial flutter ([place])   . Pressure ulcer 12/18/2015  . Protein-calorie malnutrition, severe 12/18/2015  . Metabolic acidosis [id number]  . Lactic acidosis 12/17/2015  . Leucocytosis 12/17/2015  . Dehydration 12/17/2015  . Starvation ketoacidosis 12/17/2015  . Alcohol abuse 12/17/2015  . Increased anion gap metabolic acidosis [id number]  . Essential hypertension 12/06/2015  . Accidental drug overdose 10/26/2015  . Aspiration pneumonia ([place]) 10/23/2015  . Acute respiratory failure with hypoxia ([place]) 10/23/2015  . Acute kidney injury ([place]) 10/23/2015  . Transaminitis 10/23/2015  . Positive blood culture 10/23/2015  . Elevated troponin 10/23/2015  . Thrombocytopenia ([place]) 10/23/2015  . Normocytic anemia 10/23/2015  . Shock ([place]) 10/19/2015  . Major depressive disorder, recurrent episode, moderate ([place]) 09/18/2014  . Non-traumatic compression fracture of vertebral column with routine healing 07/17/2014  . Osteoporosis 07/17/2014  . H/O vitamin D deficiency 07/17/2014  . Odynophagia 07/09/2014  . Dysphagia 07/08/2014  . Chest pain 05/20/2014  . Nausea & vomiting 05/20/2014  . 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 Major depression, chronic ([place]) 03/29/2014  . Breath shortness 03/29/2014  . Hypercholesterolemia without hypertriglyceridemia 03/29/2014  . Fatigue 03/29/2014  . Classical migraine with intractable migraine 03/29/2014  . Disorder of mitral valve 03/29/2014  . Atypical migraine 03/29/2014  . Atrial septal defect of fossa ovalis 03/29/2014  . HLD (hyperlipidemia) 03/29/2014  . Arthralgia of  multiple joints 03/29/2014  . Anemia, iron deficiency 03/29/2014  . Fibrositis 03/29/2014  . Abdominal pain 12/20/2013  . Absolute anemia 09/20/2013  . Bronchitis 09/20/2013  . Vaginitis and vulvovaginitis 09/20/2013  . Family history of breast cancer 07/13/2013  . Palpitation 07/02/2013  . History of mitral valve repair  07/02/2013  . Status post patch closure of ASD 07/02/2013  . Hyperlipidemia 07/02/2013  . OAB (overactive bladder) 06/13/2013  . Ovarian cyst, left 03/15/2013  . Vaginal lesion 03/15/2013  . Ovarian mass 03/15/2013  . Maternal DVT (deep vein thrombosis), history of 02/26/2013  . PMB (postmenopausal bleeding) 02/26/2013  . Alcohol dependence ([place]) 11/07/2012  . Unspecified vitamin D deficiency 08/17/2012  . L1 vertebral fracture ([place]) 08/01/2012  . Spinal compression fracture ([place]) 07/12/2012  . Menopausal state 07/12/2012  . Psoriasis 07/12/2012  . Severe episode of recurrent major depressive disorder ([place]) 12/07/2011  . Generalized anxiety disorder 12/07/2011  . Cardiomyopathy 03/16/2011  . Hearing impairment   . Migraine   . Arthritis    Past Medical History:  Diagnosis Date  . Allergy    SEASONAL  . Anemia   . Anxiety   . Arthritis   . ASD (atrial septal defect)    a. s/p ASD repair in 1973  . CAD (coronary artery disease)    a. 12/2015: NSTEMI occurring after cardiac arrest secondary to aspiration PNA. Echo w/ EF of 55-60%, no WMA. Outpt ischemic eval needed.  . Cardiomyopathy   . Deaf   . Depression   . Dysphagia    due to esophageal infections.  . Fibromyalgia   . Gastritis   . GERD (gastroesophageal reflux disease)   . Hearing impairment   . Hypertension    [name], take htn medication to regulate heart beat.  . Migraine   . Osteoporosis   . Pulmonary embolism ([place])    occured post c-section of her daughter  . Sleep apnea   . Vitamin D deficiency    Past Surgical History:  Procedure Laterality Date  . APPENDECTOMY    . CARDIAC SURGERY  January 2007   mitral valve repair  . CARDIAC SURGERY  1973   atrial septal defect  . CARDIAC SURGERY    . COCHLEAR IMPLANT    . INNER EAR SURGERY     TUBES  . LAPAROSCOPIC CHOLECYSTECTOMY  2012  . TONSILLECTOMY AND ADENOIDECTOMY    . TUBAL LIGATION     Allergies  Allergen Reactions  . Depakote [Divalproex  Sodium] Anaphylaxis  . Diazepam Other (See Comments)    Died on operating table  . Valium Anaphylaxis  . Tizanidine Hcl Other (See Comments)    "passed out"   Prior to Admission medications   Medication Sig Start Date End Date Taking? Authorizing Provider  aspirin 81 MG chewable tablet Chew 81 mg by mouth daily.   Yes Historical Provider, MD  atorvastatin (LIPITOR) 40 MG tablet Take 1 tablet (40 mg total) by mouth daily at 6 PM. 02/02/16  Yes [name], MD  Cholecalciferol (VITAMIN D3) 2000 units TABS Take 1 tablet by mouth daily.   Yes Historical Provider, MD  fluticasone (FLONASE) 50 MCG/ACT nasal spray Place 2 sprays into both nostrils daily.   Yes Historical Provider, MD  folic acid (FOLVITE) 1 MG tablet Take 1 mg by mouth daily.   Yes Historical Provider, MD  Metoprolol Tartrate 75 MG TABS Take 75 mg by mouth 2 (two) times daily. 02/02/16  Yes [name], MD  mirabegron ER (  MYRBETRIQ) 50 MG TB24 tablet Take 1 tablet (50 mg total) by mouth daily. 07/17/14  Yes Terrance Mass, MD  nitroGLYCERIN (NITROSTAT) 0.4 MG SL tablet Place 1 tablet (0.4 mg total) under the tongue every 5 (five) minutes as needed for chest pain. 12/26/15  Yes Belkys A Regalado, MD  Omega-3 Fatty Acids (FISH OIL) 1000 MG CAPS Take 1,000 mg by mouth 2 (two) times daily.    Yes Historical Provider, MD  ondansetron (ZOFRAN ODT) 4 MG disintegrating tablet Take 1 tablet (4 mg total) by mouth every 8 (eight) hours as needed for nausea. 11/08/15  Yes [name], MD  ondansetron (ZOFRAN) 4 MG tablet Take 1 tablet (4 mg total) by mouth every 8 (eight) hours as needed for nausea or vomiting. [id number]  Yes Delora Fuel, MD  oxyCODONE-acetaminophen (PERCOCET) 5-325 MG tablet Take 1 tablet by mouth every 4 (four) hours as needed for moderate pain. [id number]  Yes Delora Fuel, MD  pantoprazole (PROTONIX) 40 MG tablet TAKE 1 TABLET (40 MG TOTAL) BY MOUTH DAILY. [id number]  Yes Delora Fuel, MD  prazosin (MINIPRESS) 2 MG capsule Take 2 mg by mouth  every evening. 02/17/16  Yes Historical Provider, MD  Vilazodone HCl (VIIBRYD) 40 MG TABS Take 40 mg by mouth daily.   Yes Historical Provider, MD  vitamin B-12 (CYANOCOBALAMIN) 500 MCG tablet Take 500 mcg by mouth daily.   Yes Historical Provider, MD   Social History   Social History  . Marital status: Single    Spouse name: N/A  . Number of children: N/A  . Years of education: N/A   Occupational History  . Not on file.   Social History Main Topics  . Smoking status: Never Smoker  . Smokeless tobacco: Never Used  . 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  . Drug use: No  . Sexual activity: Yes    Birth control/ protection: Surgical     Comment: 1st intercourse- 19, partners- 6   Other Topics Concern  . Not on file   Social History Narrative   Lives with 48 year old daughter.       Review of Systems  Constitutional: Negative for chills and fever.  HENT: Positive for congestion, ear pain and sore throat.   Eyes: Positive for discharge.  Respiratory: Negative for cough.   Musculoskeletal: Positive for arthralgias and myalgias.  Neurological: Negative for weakness and numbness.     Objective:   Physical Exam  HENT:  Right Ear: Tympanic membrane is scarred.  Left Ear: Tympanic membrane is scarred.  Nose: Mucosal edema present.  Mouth/Throat: Uvula is midline, oropharynx is clear and moist and mucous membranes are normal.  TM's pearly grey.  Eyes: Conjunctivae are normal.  Increased tearing and crusting, bilateral upper and lower lid erythematous and edematous, but conjunctiva normal.   Cardiovascular: Normal rate, regular rhythm, S1 normal, S2 normal and normal heart sounds.   Pulmonary/Chest: Effort normal. She has no wheezes. She has no rales.  Musculoskeletal:  No tenderness over upper thoracic spine. Tenderness along the spine of scapula. Pain over the tip of acromion. Abrasion of area. No bruising. AC joint is  stable. Abduction to 75 degrees with pain in the deltoid. Flexion to 90 degrees. Full adduction. Mild limitation on internal rotation and extension.    Lymphadenopathy:    She has no cervical adenopathy.   Vitals:   03/17/16 1527  BP: 100/60  Pulse: 86  Resp:  16  Temp: 98.2 F (36.8 C)  TempSrc: Oral  SpO2: 99%  Weight: 131 lb 9.6 oz (59.7 kg)  Height: 5\' 4"  (1.626 m)    Dg Shoulder Right  Result Date: 03/17/2016 CLINICAL DATA:  One week history of pain.  Recent fall EXAM: RIGHT SHOULDER - 2+ VIEW COMPARISON:  None. FINDINGS: Oblique, Y scapular, and axillary images were obtained. There is no fracture or dislocation. There is mild generalized osteoarthritic change. No erosive change. Visualized right lung is clear. IMPRESSION: Mild generalized osteoarthritic change.  No fracture or dislocation. Electronically Signed   By: Lowella Grip III M.D.   On: 03/17/2016 17:31     Assessment & Plan:   1. Acute sinusitis, recurrence not specified, unspecified location   2. Allergic conjunctivitis and rhinitis, bilateral   3. Blepharitis of upper and lower eyelids of both eyes, unspecified type   4. Right shoulder injury, initial encounter   5. Adhesive capsulitis of right shoulder     Orders Placed This Encounter  Procedures  . DG Shoulder Right    Standing Status:   Future    Number of Occurrences:   1    Standing Expiration Date:   03/17/2017    Order Specific Question:   Reason for Exam (SYMPTOM  OR DIAGNOSIS REQUIRED)    Answer:   pain over tip of acromion and spine of scapula after walked into wall 1 wk prior, limited ROM    Order Specific Question:   Is the patient pregnant?    Answer:   No    Order Specific Question:   Preferred imaging location?    Answer:   External  . Ambulatory referral to Physical Therapy    Referral Priority:   Routine    Referral Type:   Physical Medicine    Referral Reason:   Specialty Services Required    Requested Specialty:   Physical Therapy      Number of Visits Requested:   1  . Ambulatory referral to Orthopedic Surgery    Referral Priority:   Routine    Referral Type:   Surgical    Referral Reason:   Specialty Services Required    Requested Specialty:   Orthopedic Surgery    Number of Visits Requested:   1    Meds ordered this encounter  Medications  . amoxicillin (AMOXIL) 400 MG/5ML suspension    Sig: Take 12.5 mLs (1,000 mg total) by mouth 2 (two) times daily.    Dispense:  250 mL    Refill:  0  . cetirizine HCl (ZYRTEC) 5 MG/5ML SYRP    Sig: Take 10 mLs (10 mg total) by mouth daily.    Dispense:  300 mL    Refill:  2  . capsicum (ZOSTRIX) 0.075 % topical cream    Sig: Apply 1 application topically 3 (three) times daily. Use a latex glove to apply. DO NOT GET ON FACE.    Dispense:  56 g    Refill:  0    I personally performed the services described in this documentation, which was scribed in my presence. The recorded information has been reviewed and considered, and addended by me as needed.   [name], M.D.  [facility] [address] [place], [place] [address] [phone] phone [phone] fax  03/19/16 2:22 PM

## 2016-03-18 ENCOUNTER — Other Ambulatory Visit: Payer: Self-pay

## 2016-03-18 ENCOUNTER — Telehealth: Payer: Self-pay

## 2016-03-18 NOTE — Patient Outreach (Signed)
[employer] [facility]) Care Management  03/18/2016  YANILEN GRANADE [birth year] [id number]   Telephone call to patient for screening call.  Patient verified HIPAA.  Explained to patient [facility].  Patient declined services and felt like she was handling he care needs.  Patient agreed to receive letter and brochure.    Plan: RN Health Coach will send letter and brochure and notify care management assistant of patient status.    Jone Baseman, RN, MSN [place] [phone]

## 2016-03-18 NOTE — Telephone Encounter (Signed)
PATIENT STATES SHE SAW DR. SHAW YESTERDAY BECAUSE SHE COULD NOT MOVE HER (R) SHOULDER. SHE WAS DIAGNOSED WITH ARTHRITIS. DR. Brigitte Pulse SUGGESTED SHE HAVE PHYSICAL THERAPY. PATIENT SAID SHE SAW DR. Mina Marble AT [facility] ABOUT 8 MONTHS AGO FOR HER ARTHRITIS AND HE GAVE HER A CORTISONE SHOT. SHE WOULD LIKE DR. SHAW TO GIVE HER A REFERRAL TO SEE HIM AGAIN BEFORE SHE TRYS THE PHYSICAL THERAPY. BEST PHONE [phone] (CELL)  [place]

## 2016-03-19 NOTE — Telephone Encounter (Signed)
Please advise 

## 2016-03-24 NOTE — Telephone Encounter (Signed)
done

## 2016-03-26 DIAGNOSIS — M25551 Pain in right hip: Secondary | ICD-10-CM | POA: Diagnosis not present

## 2016-03-26 DIAGNOSIS — M25511 Pain in right shoulder: Secondary | ICD-10-CM | POA: Diagnosis not present

## 2016-04-02 ENCOUNTER — Telehealth: Payer: Self-pay

## 2016-04-02 NOTE — Telephone Encounter (Signed)
Pts mother stated that she is having an issue with her daughters insurance. She has medicare and medicaid, medicare is primary but medicaid told her we dont participate with insurance and should find a new doctor? I told her we have patients we see that have both as long as medicare is primary. Pts mother said she will call insurance to see what the issue is and the explanation that I gave to her.  Please advise  [phone]

## 2016-04-04 DIAGNOSIS — J189 Pneumonia, unspecified organism: Secondary | ICD-10-CM | POA: Diagnosis not present

## 2016-04-13 ENCOUNTER — Ambulatory Visit (INDEPENDENT_AMBULATORY_CARE_PROVIDER_SITE_OTHER): Payer: Medicare Other | Admitting: Family Medicine

## 2016-04-13 ENCOUNTER — Ambulatory Visit (INDEPENDENT_AMBULATORY_CARE_PROVIDER_SITE_OTHER): Payer: Medicare Other

## 2016-04-13 VITALS — BP 127/90 | HR 93 | Temp 98.2°F | Resp 17 | Ht 64.0 in | Wt 123.8 lb

## 2016-04-13 DIAGNOSIS — J189 Pneumonia, unspecified organism: Secondary | ICD-10-CM

## 2016-04-13 DIAGNOSIS — J208 Acute bronchitis due to other specified organisms: Secondary | ICD-10-CM

## 2016-04-13 LAB — POCT SEDIMENTATION RATE: POCT SED RATE: 20 mm/h (ref 0–22)

## 2016-04-13 LAB — POCT CBC
GRANULOCYTE PERCENT: 50.2 % (ref 37–80)
HEMATOCRIT: 41.8 % (ref 37.7–47.9)
HEMOGLOBIN: 15 g/dL (ref 12.2–16.2)
Lymph, poc: 1.4 (ref 0.6–3.4)
MCH: 34.5 pg — AB (ref 27–31.2)
MCHC: 35.8 g/dL — AB (ref 31.8–35.4)
MCV: 96.3 fL (ref 80–97)
MID (cbc): 0.2 (ref 0–0.9)
MPV: 7 fL (ref 0–99.8)
PLATELET COUNT, POC: 198 10*3/uL (ref 142–424)
POC GRANULOCYTE: 1.6 — AB (ref 2–6.9)
POC LYMPH PERCENT: 43 %L (ref 10–50)
POC MID %: 6.8 %M (ref 0–12)
RBC: 4.34 M/uL (ref 4.04–5.48)
RDW, POC: 17.1 %
WBC: 3.2 10*3/uL — AB (ref 4.6–10.2)

## 2016-04-13 LAB — POCT INFLUENZA A/B
INFLUENZA A, POC: NEGATIVE
Influenza B, POC: NEGATIVE

## 2016-04-13 MED ORDER — MUCINEX DM MAXIMUM STRENGTH 60-1200 MG PO TB12: 1.0000 | ORAL_TABLET | Freq: Two times a day (BID) | ORAL | 1 refills | Status: DC

## 2016-04-13 MED ORDER — BENZONATATE 200 MG PO CAPS: 200.0000 mg | ORAL_CAPSULE | Freq: Three times a day (TID) | ORAL | 0 refills | Status: DC | PRN

## 2016-04-13 NOTE — Progress Notes (Signed)
By signing my name below, I, Mesha Guinyard, attest that this documentation has been prepared under the direction and in the presence of [name], MD.  Electronically Signed: Verlee Monte, Medical Scribe. 04/13/16. 1:15 PM.  Subjective:    Patient ID: [name], female    DOB: [birth year], 55 y.o.   MRN: [id number]  HPI Chief Complaint  Patient presents with  . Follow-up    Sunday pt went to another clinic and was told she had pneumonia and bilateral ear infections.     HPI Comments: [name] is a 55 y.o. female who presents to the [facility] for follow-up. Pt was dx with PNA and bilateral ear infection 11/26 at a walk in clinic. She had a CXR, an injection in her buttock, and 5 days of azithromycin. She reports coughing, congestion with improving greens yellow sputum, hearing loss, pain with coughing, emesis with coughing hard, fatigue. Has been taking mucinex 1-2x a day and has been drinking plenty of fluids. She suspects her ears are better. Denies palpitations, chest pain, choking on sputum, trouble urinating or with bm.  Patient Active Problem List   Diagnosis Date Noted  . Blepharitis of both eyes 02/03/2016  . Cardiac arrest ([place]) 12/22/2015  . Uncontrolled hypertension   . Aspiration pneumonia of right lower lobe due to gastric secretions ([place])   . Hypokalemia   . Pneumonia   . NSTEMI (non-ST elevated myocardial infarction) ([place]) 12/19/2015  . Typical atrial flutter ([place])   . Pressure ulcer 12/18/2015  . Protein-calorie malnutrition, severe 12/18/2015  . Metabolic acidosis [id number]  . Lactic acidosis 12/17/2015  . Leucocytosis 12/17/2015  . Dehydration 12/17/2015  . Starvation ketoacidosis 12/17/2015  . Alcohol abuse 12/17/2015  . Increased anion gap metabolic acidosis [id number]  . Essential hypertension 12/06/2015  . Accidental drug overdose 10/26/2015  . Aspiration pneumonia ([place]) 10/23/2015  . Acute respiratory failure with hypoxia  ([place]) 10/23/2015  . Acute kidney injury ([place]) 10/23/2015  . Transaminitis 10/23/2015  . Positive blood culture 10/23/2015  . Elevated troponin 10/23/2015  . Thrombocytopenia ([place]) 10/23/2015  . Normocytic anemia 10/23/2015  . Shock ([place]) 10/19/2015  . Major depressive disorder, recurrent episode, moderate ([place]) 09/18/2014  . Non-traumatic compression fracture of vertebral column with routine healing 07/17/2014  . Osteoporosis 07/17/2014  . H/O vitamin D deficiency 07/17/2014  . Odynophagia 07/09/2014  . Dysphagia 07/08/2014  . Chest pain 05/20/2014  . Nausea & vomiting 05/20/2014  . 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 Major depression, chronic ([place]) 03/29/2014  . Breath shortness 03/29/2014  . Hypercholesterolemia without hypertriglyceridemia 03/29/2014  . Fatigue 03/29/2014  . Classical migraine with intractable migraine 03/29/2014  . Disorder of mitral valve 03/29/2014  . Atypical migraine 03/29/2014  . Atrial septal defect of fossa ovalis 03/29/2014  . HLD (hyperlipidemia) 03/29/2014  . Arthralgia of multiple joints 03/29/2014  . Anemia, iron deficiency 03/29/2014  . Fibrositis 03/29/2014  . Abdominal pain 12/20/2013  . Absolute anemia 09/20/2013  . Bronchitis 09/20/2013  . Vaginitis and vulvovaginitis 09/20/2013  . Family history of breast cancer 07/13/2013  . Palpitation 07/02/2013  . History of mitral valve repair 07/02/2013  . Status post patch closure of ASD 07/02/2013  . Hyperlipidemia 07/02/2013  . OAB (overactive bladder) 06/13/2013  . Ovarian cyst, left 03/15/2013  . Vaginal lesion 03/15/2013  . Ovarian mass 03/15/2013  . Maternal DVT (deep vein thrombosis), history of 02/26/2013  . PMB (postmenopausal bleeding) 02/26/2013  . Alcohol dependence (  [place]) 11/07/2012  . Unspecified vitamin D deficiency 08/17/2012  . L1 vertebral fracture ([place]) 08/01/2012  . Spinal compression fracture ([place]) 07/12/2012  . Menopausal state  07/12/2012  . Psoriasis 07/12/2012  . Severe episode of recurrent major depressive disorder ([place]) 12/07/2011  . Generalized anxiety disorder 12/07/2011  . Cardiomyopathy 03/16/2011  . Hearing impairment   . Migraine   . Arthritis    Past Medical History:  Diagnosis Date  . Allergy    SEASONAL  . Anemia   . Anxiety   . Arthritis   . ASD (atrial septal defect)    a. s/p ASD repair in 1973  . CAD (coronary artery disease)    a. 12/2015: NSTEMI occurring after cardiac arrest secondary to aspiration PNA. Echo w/ EF of 55-60%, no WMA. Outpt ischemic eval needed.  . Cardiomyopathy   . Deaf   . Depression   . Dysphagia    due to esophageal infections.  . Fibromyalgia   . Gastritis   . GERD (gastroesophageal reflux disease)   . Hearing impairment   . Hypertension    [name], take htn medication to regulate heart beat.  . Migraine   . Osteoporosis   . Pulmonary embolism ([place])    occured post c-section of her daughter  . Sleep apnea   . Vitamin D deficiency    Past Surgical History:  Procedure Laterality Date  . APPENDECTOMY    . CARDIAC SURGERY  January 2007   mitral valve repair  . CARDIAC SURGERY  1973   atrial septal defect  . CARDIAC SURGERY    . COCHLEAR IMPLANT    . INNER EAR SURGERY     TUBES  . LAPAROSCOPIC CHOLECYSTECTOMY  2012  . TONSILLECTOMY AND ADENOIDECTOMY    . TUBAL LIGATION     Allergies  Allergen Reactions  . Depakote [Divalproex Sodium] Anaphylaxis  . Diazepam Other (See Comments)    Died on operating table  . Valium Anaphylaxis  . Tizanidine Hcl Other (See Comments)    "passed out"   Prior to Admission medications   Medication Sig Start Date End Date Taking? Authorizing Provider  aspirin 81 MG chewable tablet Chew 81 mg by mouth daily.   Yes Historical Provider, MD  atorvastatin (LIPITOR) 40 MG tablet Take 1 tablet (40 mg total) by mouth daily at 6 PM. 02/02/16  Yes [name], MD  capsicum (ZOSTRIX) 0.075 % topical cream Apply 1 application  topically 3 (three) times daily. Use a latex glove to apply. DO NOT GET ON FACE. 03/17/16  Yes [name], MD  cetirizine HCl (ZYRTEC) 5 MG/5ML SYRP Take 10 mLs (10 mg total) by mouth daily. 03/17/16  Yes [name], MD  Cholecalciferol (VITAMIN D3) 2000 units TABS Take 1 tablet by mouth daily.   Yes Historical Provider, MD  fluticasone (FLONASE) 50 MCG/ACT nasal spray Place 2 sprays into both nostrils daily.   Yes Historical Provider, MD  folic acid (FOLVITE) 1 MG tablet Take 1 mg by mouth daily.   Yes Historical Provider, MD  Metoprolol Tartrate 75 MG TABS Take 75 mg by mouth 2 (two) times daily. 02/02/16  Yes [name], MD  mirabegron ER (MYRBETRIQ) 50 MG TB24 tablet Take 1 tablet (50 mg total) by mouth daily. 07/17/14  Yes Terrance Mass, MD  nitroGLYCERIN (NITROSTAT) 0.4 MG SL tablet Place 1 tablet (0.4 mg total) under the tongue every 5 (five) minutes as needed for chest pain. 12/26/15  Yes [place],  MD  Omega-3 Fatty Acids (FISH OIL) 1000 MG CAPS Take 1,000 mg by mouth 2 (two) times daily.    Yes Historical Provider, MD  ondansetron (ZOFRAN ODT) 4 MG disintegrating tablet Take 1 tablet (4 mg total) by mouth every 8 (eight) hours as needed for nausea. 11/08/15  Yes [name], MD  ondansetron (ZOFRAN) 4 MG tablet Take 1 tablet (4 mg total) by mouth every 8 (eight) hours as needed for nausea or vomiting. [id number]  Yes Delora Fuel, MD  oxyCODONE-acetaminophen (PERCOCET) 5-325 MG tablet Take 1 tablet by mouth every 4 (four) hours as needed for moderate pain. [id number]  Yes Delora Fuel, MD  pantoprazole (PROTONIX) 40 MG tablet TAKE 1 TABLET (40 MG TOTAL) BY MOUTH DAILY. [id number]  Yes Delora Fuel, MD  prazosin (MINIPRESS) 2 MG capsule Take 2 mg by mouth every evening. 02/17/16  Yes Historical Provider, MD  Vilazodone HCl (VIIBRYD) 40 MG TABS Take 40 mg by mouth daily.   Yes Historical Provider, MD  vitamin B-12 (CYANOCOBALAMIN) 500 MCG tablet Take 500 mcg by mouth daily.   Yes Historical Provider, MD    Social History   Social History  . Marital status: Single    Spouse name: N/A  . Number of children: N/A  . Years of education: N/A   Occupational History  . Not on file.   Social History Main Topics  . Smoking status: Never Smoker  . Smokeless tobacco: Never Used  . 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  . Drug use: No  . Sexual activity: Yes    Birth control/ protection: Surgical     Comment: 1st intercourse- 19, partners- 6   Other Topics Concern  . Not on file   Social History Narrative   Lives with 42 year old daughter.       Review of Systems  Constitutional: Positive for fatigue.  HENT: Positive for congestion and hearing loss.   Respiratory: Positive for cough.   Cardiovascular: Negative for chest pain and palpitations.  Gastrointestinal: Positive for vomiting.   Objective:  Physical Exam  Constitutional: She appears well-developed and well-nourished. No distress.  HENT:  Head: Normocephalic and atraumatic.  Right Ear: Tympanic membrane is scarred.  Left Ear: Tympanic membrane is scarred. A middle ear effusion is present.  Nares and oropharynx edematous and erythematous with congestion  Eyes: Conjunctivae are normal.  Neck: Neck supple.  Cardiovascular: Regular rhythm.  Tachycardia present.  Exam reveals no friction rub.   No murmur heard. Pulmonary/Chest: Effort normal and breath sounds normal. No respiratory distress. She has no wheezes. She has no rales.  Severe paroxysmal coughing with regurgitation.  Neurological: She is alert.  Skin: Skin is warm and dry.  Psychiatric: She has a normal mood and affect. Her behavior is normal.  Nursing note and vitals reviewed.  BP 127/90 (BP Location: Right Arm, Patient Position: Sitting, Cuff Size: Normal)   Pulse 93   Temp 98.2 F (36.8 C) (Oral)   Resp 17   Ht 5\' 4"  (1.626 m)   Wt 123 lb 12.8 oz (56.2 kg)   SpO2 96%   BMI 21.25 kg/m    Results for  orders placed or performed in visit on 04/13/16  POCT CBC  Result Value Ref Range   WBC 3.2 (A) 4.6 - 10.2 K/uL   Lymph, poc 1.4 0.6 - 3.4   POC LYMPH PERCENT 43.0 10 - 50 %L   MID (cbc)  0.2 0 - 0.9   POC MID % 6.8 0 - 12 %M   POC Granulocyte 1.6 (A) 2 - 6.9   Granulocyte percent 50.2 37 - 80 %G   RBC 4.34 4.04 - 5.48 M/uL   Hemoglobin 15.0 12.2 - 16.2 g/dL   HCT, POC 41.8 37.7 - 47.9 %   MCV 96.3 80 - 97 fL   MCH, POC 34.5 (A) 27 - 31.2 pg   MCHC 35.8 (A) 31.8 - 35.4 g/dL   RDW, POC 17.1 %   Platelet Count, POC 198 142 - 424 K/uL   MPV 7.0 0 - 99.8 fL  POCT Influenza A/B  Result Value Ref Range   Influenza A, POC Negative Negative   Influenza B, POC Negative Negative   Dg Chest 2 View  Result Date: 04/13/2016 CLINICAL DATA:  Status post treatment for pneumonia which was diagnosed 10 days ago. EXAM: CHEST  2 VIEW COMPARISON:  PA and lateral chest 12/31/2015. FINDINGS: The lungs are clear. Heart size is normal. No pneumothorax or pleural effusion. The patient is status post mitral valve repair. IMPRESSION: No acute disease. Electronically Signed   By: Inge Rise M.D.   On: 04/13/2016 13:42   Assessment & Plan:   1. Community acquired pneumonia, unspecified laterality - per pt report was seen on CXR 10d prior and treated with zpack w/o any sig improvement in sxs. Fortunately, eval and labs today point to viral etiology so cont symptomatic care.  2. Acute viral bronchitis     Orders Placed This Encounter  Procedures  . DG Chest 2 View    Standing Status:   Future    Number of Occurrences:   1    Standing Expiration Date:   04/13/2017    Order Specific Question:   Reason for Exam (SYMPTOM  OR DIAGNOSIS REQUIRED)    Answer:   diagnosed with pneumonia treated wiht azithro 10d prior, sxs worsening    Order Specific Question:   Is the patient pregnant?    Answer:   No    Order Specific Question:   Preferred imaging location?    Answer:   External  . POCT CBC  . POCT  SEDIMENTATION RATE  . POCT Influenza A/B    Meds ordered this encounter  Medications  . benzonatate (TESSALON) 200 MG capsule    Sig: Take 1 capsule (200 mg total) by mouth 3 (three) times daily as needed for cough.    Dispense:  60 capsule    Refill:  0  . Dextromethorphan-Guaifenesin (MUCINEX DM MAXIMUM STRENGTH) 60-1200 MG TB12    Sig: Take 1 tablet by mouth every 12 (twelve) hours.    Dispense:  14 each    Refill:  1    I personally performed the services described in this documentation, which was scribed in my presence. The recorded information has been reviewed and considered, and addended by me as needed.   [name], M.D.  [facility] [address] [place], [place] [address] [phone] phone [phone] fax  04/14/16 1:46 AM

## 2016-04-13 NOTE — Patient Instructions (Addendum)
IF you received an x-ray today, you will receive an invoice from [facility]. Please contact [facility] at [phone] with questions or concerns regarding your invoice.   IF you received labwork today, you will receive an invoice from [employer]. Please contact [facility] at [phone] with questions or concerns regarding your invoice.   Our billing staff will not be able to assist you with questions regarding bills from these companies.  You will be contacted with the lab results as soon as they are available. The fastest way to get your results is to activate your My Chart account. Instructions are located on the last page of this paperwork. If you have not heard from [place] regarding the results in 2 weeks, please contact this office.      Acute Bronchitis, Adult Acute bronchitis is sudden (acute) swelling of the air tubes (bronchi) in the lungs. Acute bronchitis causes these tubes to fill with mucus, which can make it hard to breathe. It can also cause coughing or wheezing. In adults, acute bronchitis usually goes away within 2 weeks. A cough caused by bronchitis may last up to 3 weeks. Smoking, allergies, and asthma can make the condition worse. Repeated episodes of bronchitis may cause further lung problems, such as chronic obstructive pulmonary disease (COPD). What are the causes? This condition can be caused by germs and by substances that irritate the lungs, including:  Cold and flu viruses. This condition is most often caused by the same virus that causes a cold.  Bacteria.  Exposure to tobacco smoke, dust, fumes, and air pollution. What increases the risk? This condition is more likely to develop in people who:  Have close contact with someone with acute bronchitis.  Are exposed to lung irritants, such as tobacco smoke, dust, fumes, and vapors.  Have a weak immune system.  Have a respiratory condition such as asthma. What  are the signs or symptoms? Symptoms of this condition include:  A cough.  Coughing up clear, yellow, or green mucus.  Wheezing.  Chest congestion.  Shortness of breath.  A fever.  Body aches.  Chills.  A sore throat. How is this diagnosed? This condition is usually diagnosed with a physical exam. During the exam, your health care provider may order tests, such as chest X-rays, to rule out other conditions. He or she may also:  Test a sample of your mucus for bacterial infection.  Check the level of oxygen in your blood. This is done to check for pneumonia.  Do a chest X-ray or lung function testing to rule out pneumonia and other conditions.  Perform blood tests. Your health care provider will also ask about your symptoms and medical history. How is this treated? Most cases of acute bronchitis clear up over time without treatment. Your health care provider may recommend:  Drinking more fluids. Drinking more makes your mucus thinner, which may make it easier to breathe.  Taking a medicine for a fever or cough.  Taking an antibiotic medicine.  Using an inhaler to help improve shortness of breath and to control a cough.  Using a cool mist vaporizer or humidifier to make it easier to breathe. Follow these instructions at home: Medicines  Take over-the-counter and prescription medicines only as told by your health care provider.  If you were prescribed an antibiotic, take it as told by your health care provider. Do not stop taking the antibiotic even if you start to feel better. General instructions  Get  plenty of rest.  Drink enough fluids to keep your urine clear or pale yellow.  Avoid smoking and secondhand smoke. Exposure to cigarette smoke or irritating chemicals will make bronchitis worse. If you smoke and you need help quitting, ask your health care provider. Quitting smoking will help your lungs heal faster.  Use an inhaler, cool mist vaporizer, or  humidifier as told by your health care provider.  Keep all follow-up visits as told by your health care provider. This is important. How is this prevented? To lower your risk of getting this condition again:  Wash your hands often with soap and water. If soap and water are not available, use hand sanitizer.  Avoid contact with people who have cold symptoms.  Try not to touch your hands to your mouth, nose, or eyes.  Make sure to get the flu shot every year. Contact a health care provider if:  Your symptoms do not improve in 2 weeks of treatment. Get help right away if:  You cough up blood.  You have chest pain.  You have severe shortness of breath.  You become dehydrated.  You faint or keep feeling like you are going to faint.  You keep vomiting.  You have a severe headache.  Your fever or chills gets worse. This information is not intended to replace advice given to you by your health care provider. Make sure you discuss any questions you have with your health care provider. Document Released: 06/03/2004 Document Revised: 11/19/2015 Document Reviewed: 10/15/2015 Elsevier Interactive Patient Education  2017 [employer].

## 2016-04-15 ENCOUNTER — Telehealth: Payer: Self-pay

## 2016-04-15 NOTE — Telephone Encounter (Signed)
° °  Dr. Uvaldo Rising office is calling Dr. Toney Rakes would like Dr. Brigitte Pulse to give him a call regarding this patient he will be out of office today and back Friday 12/8 around 10am      Please advise [phone]

## 2016-04-21 NOTE — Telephone Encounter (Signed)
Pt has been lost to follow-up with GYN - Dr. Toney Rakes at [facility] - she is overdue for her pelvic and for her reclast infusion to treat her osteoporosis (due to h/o fragility fracture in spine).  Please call pt let her know that I really think she needs to sched appt with Dr. Toney Rakes for this asap.  thanks

## 2016-04-21 NOTE — Telephone Encounter (Signed)
Returned call, left my cell #

## 2016-04-21 NOTE — Telephone Encounter (Signed)
IC pt, LMOVM with Dr. Raul Del message. She is prior pt of Dr. Toney Rakes - if she has any problems making appt, to call us.

## 2016-04-29 IMAGING — CT CT ABD-PELV W/ CM
2 of 5 series · 17 of 46 positions shown, 19 images · IV contrast (OMNIPAQUE 300)
Comparison: 08/04/2012

CLINICAL DATA: Diffuse abdominal pain, nausea and vomiting, and
diarrhea since yesterday.

EXAM:
CT ABDOMEN AND PELVIS WITH CONTRAST
TECHNIQUE: Multidetector CT imaging of the abdomen and pelvis was performed
using the standard protocol following bolus administration of
intravenous contrast.
CONTRAST:  100mL OMNIPAQUE IOHEXOL 300 MG/ML  SOLN

[Series 2: abd/pel with · axial · 0.74mm/px · z∈[-456,-81]mm · 14 of 87 slices shown, 16 images]
[im 6/87  soft-tissue]
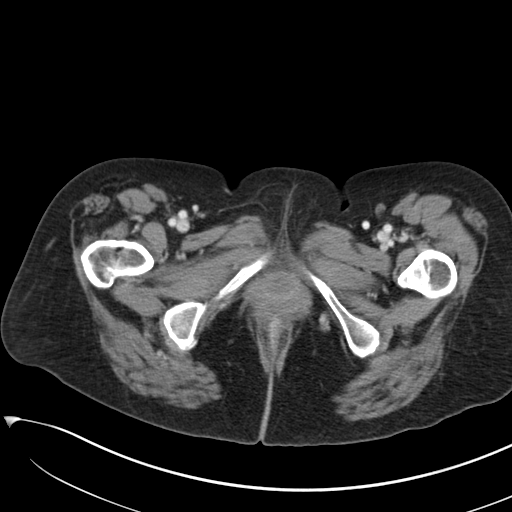
[im 6/87  bone]
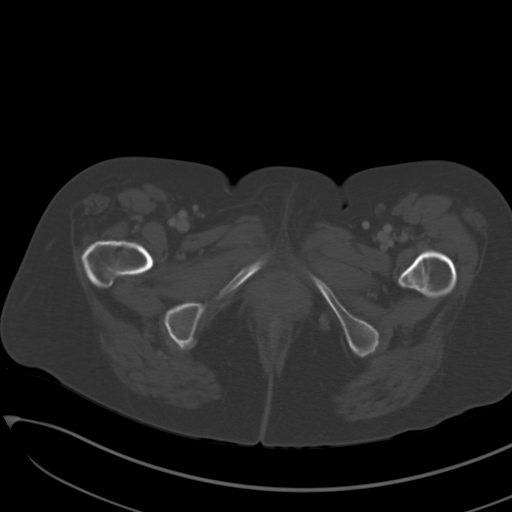
[im 11/87  soft-tissue]
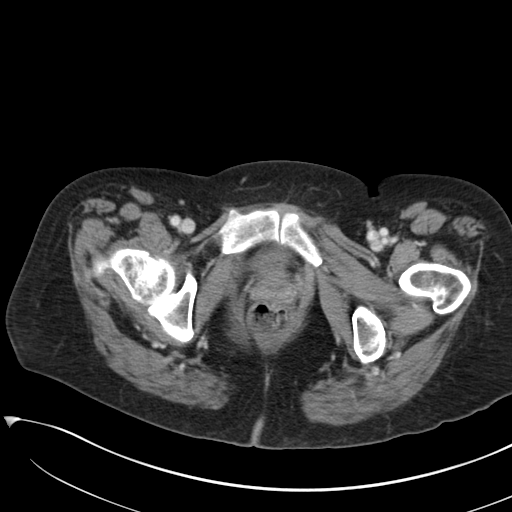
[im 16/87  soft-tissue]
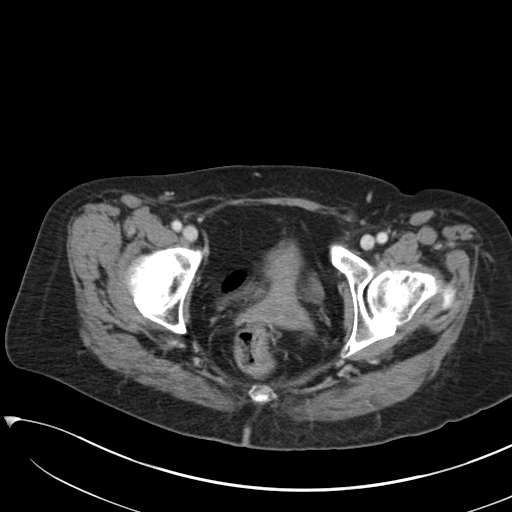
[im 26/87  soft-tissue]
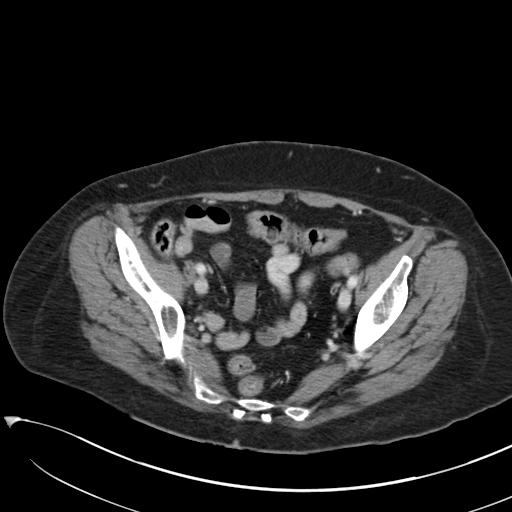
[im 31/87  soft-tissue]
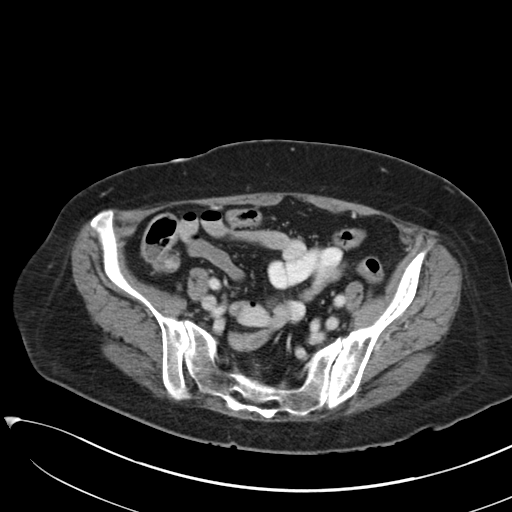
[im 36/87  soft-tissue]
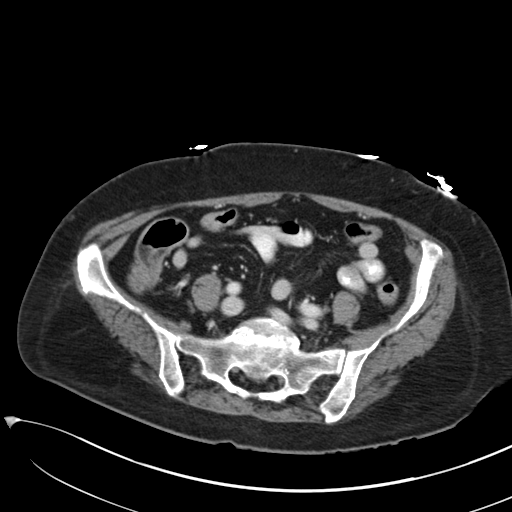
[im 41/87  soft-tissue]
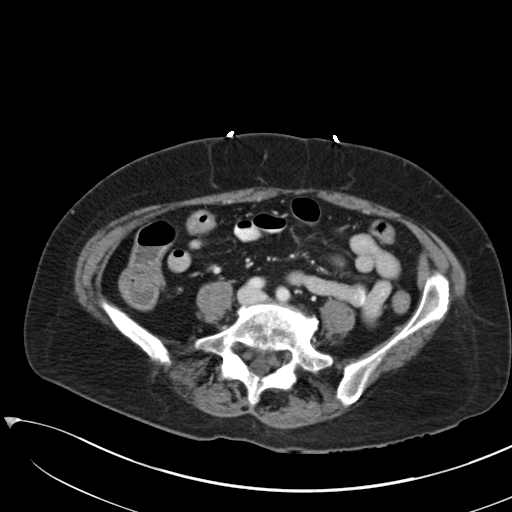
[im 46/87  soft-tissue]
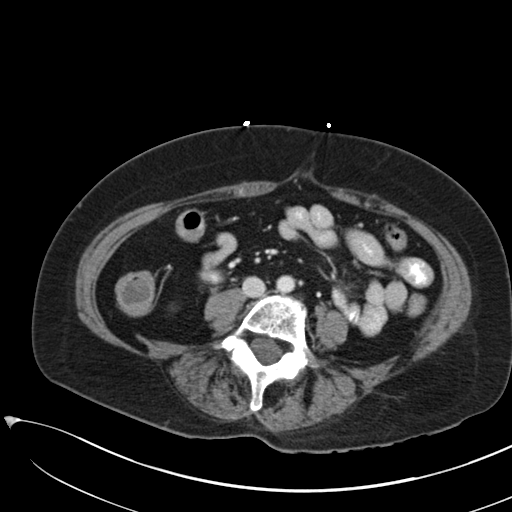
[im 51/87  soft-tissue]
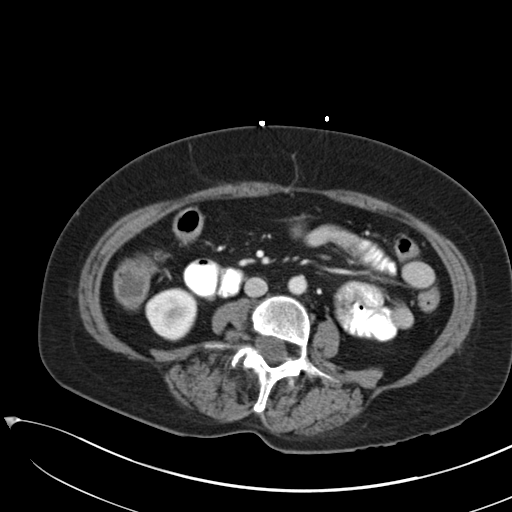
[im 51/87  bone]
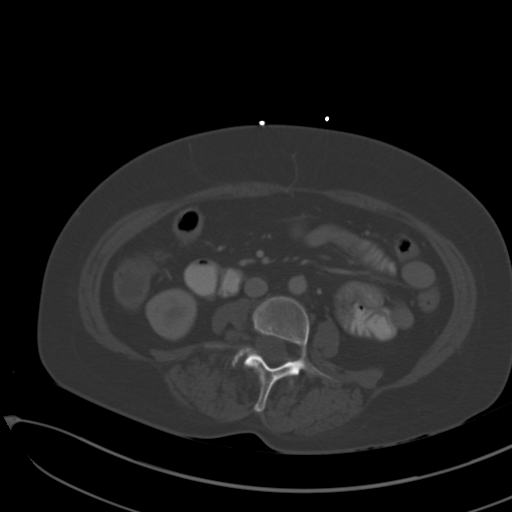
[im 56/87  soft-tissue]
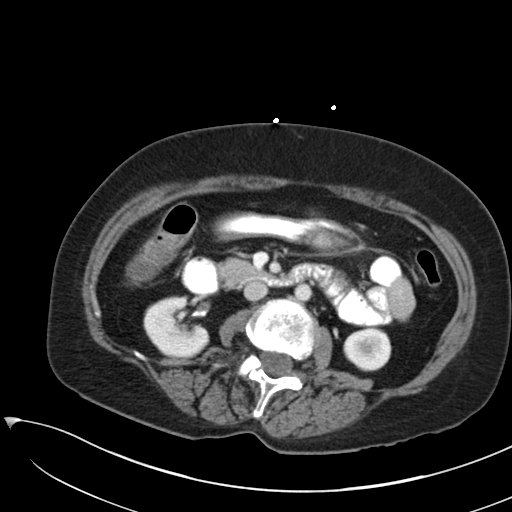
[im 66/87  soft-tissue]
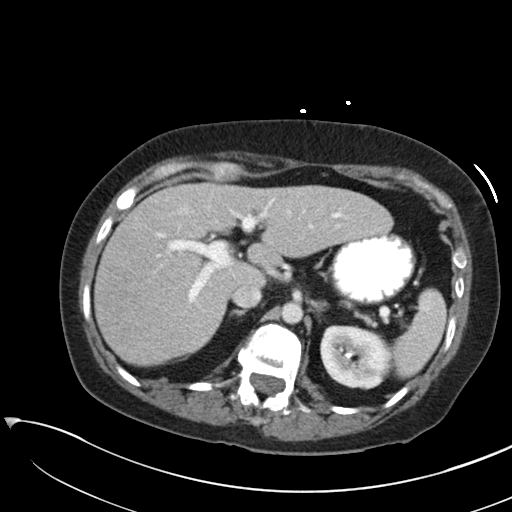
[im 71/87  soft-tissue]
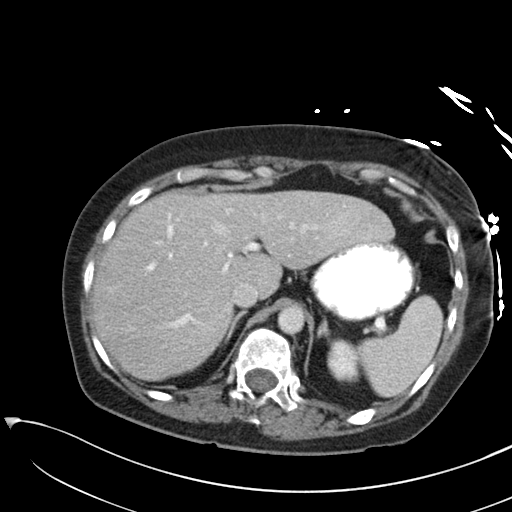
[im 76/87  soft-tissue]
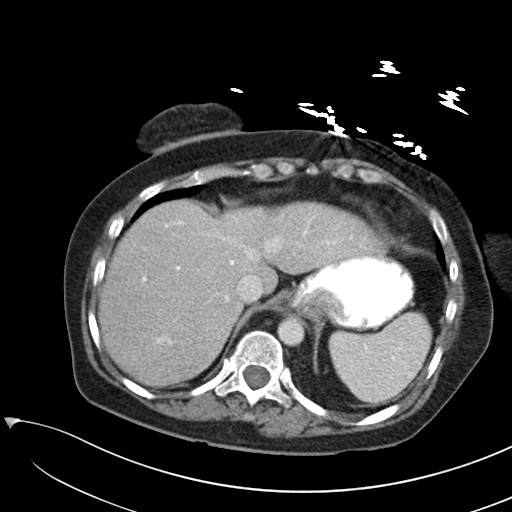
[im 81/87  soft-tissue]
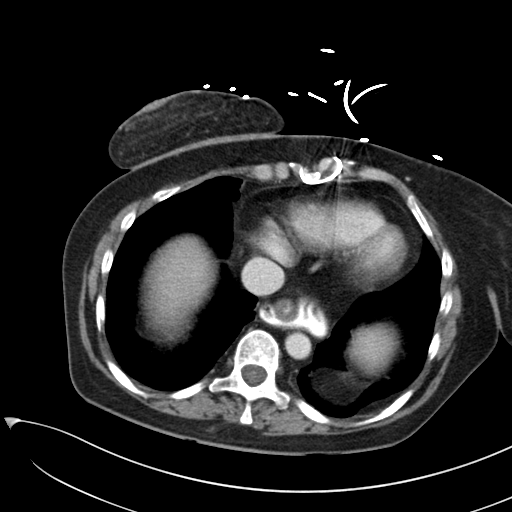

[Series 5: coronal a/|p · coronal · 0.80mm/px · 3 of 107 slices shown]
[im 36/107  soft-tissue]
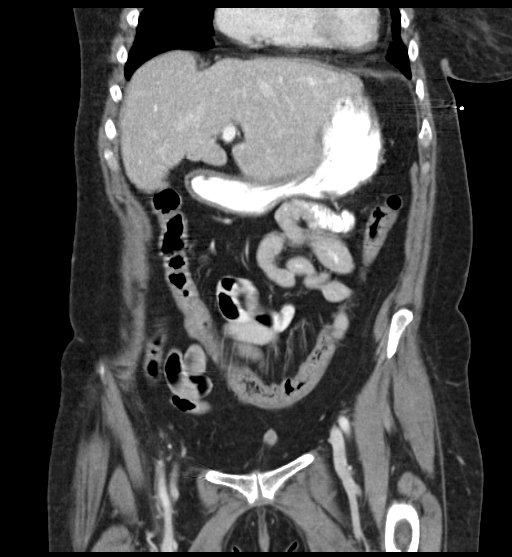
[im 48/107  soft-tissue]
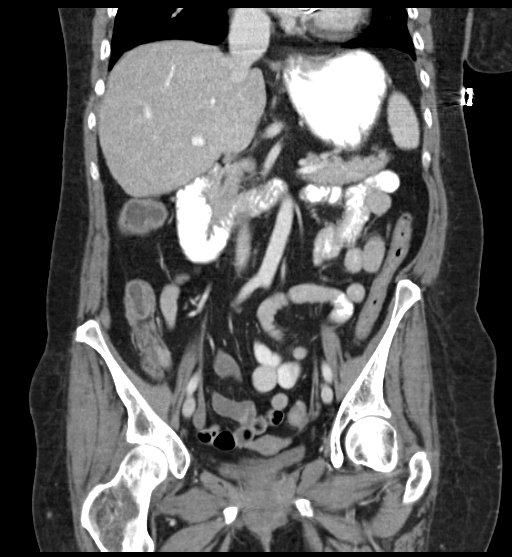
[im 59/107  soft-tissue]
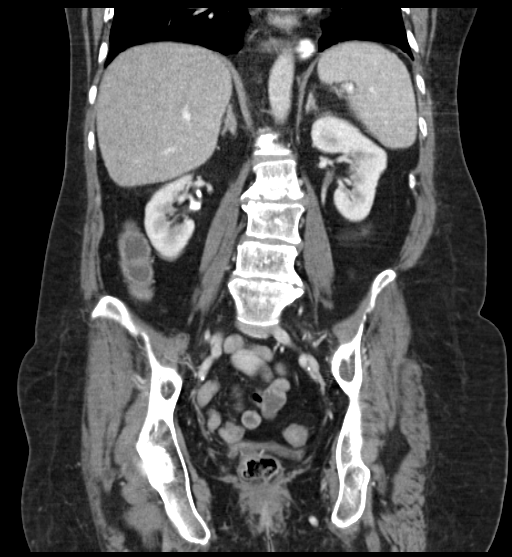

[17 of 46 positions shown; findings below may reference images not displayed]

FINDINGS: Lower chest: No acute findings. Small left posterior diaphragmatic
hernia again seen containing only fat. Stable cardiomegaly and
previous mitral valve replacement.

Hepatobiliary: No masses or other significant abnormality. Prior
cholecystectomy noted. No evidence of biliary dilatation.

Pancreas: No mass, inflammatory changes, or other significant
abnormality.

Spleen: Within normal limits in size and appearance.

Adrenals/Urinary Tract: No masses identified. No evidence of
hydronephrosis.

Stomach/Bowel: Small hiatal hernia again noted. No evidence of
obstruction, inflammatory process, or abnormal fluid collections.
Mild sigmoid diverticulosis is demonstrated, however there is no
evidence of diverticulitis.

Vascular/Lymphatic: No pathologically enlarged lymph nodes. No
evidence of abdominal aortic aneurysm.

Reproductive: No mass or other significant abnormality.

Other: None.

Musculoskeletal: No suspicious bone lesions identified. Old L1
vertebral body compression fracture and lumbar spine degenerative
changes again noted.
IMPRESSION: No acute findings within the abdomen or pelvis.

Stable small hiatal hernia and left posterior diaphragmatic hernia.

Colonic diverticulosis. No radiographic evidence of diverticulitis.

## 2016-05-11 ENCOUNTER — Ambulatory Visit (INDEPENDENT_AMBULATORY_CARE_PROVIDER_SITE_OTHER): Payer: Medicare Other | Admitting: Family Medicine

## 2016-05-11 VITALS — BP 116/64 | HR 74 | Temp 98.6°F | Resp 16 | Ht 64.0 in | Wt 130.2 lb

## 2016-05-11 DIAGNOSIS — S060X9A Concussion with loss of consciousness of unspecified duration, initial encounter: Secondary | ICD-10-CM | POA: Diagnosis not present

## 2016-05-11 MED ORDER — NITROGLYCERIN 0.4 MG SL SUBL: 0.4000 mg | SUBLINGUAL_TABLET | SUBLINGUAL | 0 refills | Status: AC | PRN

## 2016-05-11 NOTE — Patient Instructions (Addendum)
IF you received an x-ray today, you will receive an invoice from [facility]. Please contact [facility] at [phone] with questions or concerns regarding your invoice.   IF you received labwork today, you will receive an invoice from [facility]. Please contact [facility] at [phone] with questions or concerns regarding your invoice.   Our billing staff will not be able to assist you with questions regarding bills from these companies.  You will be contacted with the lab results as soon as they are available. The fastest way to get your results is to activate your My Chart account. Instructions are located on the last page of this paperwork. If you have not heard from [place] regarding the results in 2 weeks, please contact this office.     Head Injury, Adult There are many types of head injuries. Head injuries can be as minor as a bump, or they can be more severe. More severe head injuries include:  A jarring injury to the brain (concussion).  A bruise of the brain (contusion). This means there is bleeding in the brain that can cause swelling.  A cracked skull (skull fracture).  Bleeding in the brain that collects, clots, and forms a bump (hematoma). After a head injury, you may need to be observed for a while in the emergency department or urgent care. Sometimes admission to the hospital is needed. After a head injury has happened, most problems occur within the first 24 hours, but side effects may occur up to 7-10 days after the injury. It is important to watch your condition for any changes. What are the causes? There are many possible causes of a head injury. A serious head injury may happen to someone who is in a car accident (motor vehicle collision). Other causes of major head injuries include bicycle or motorcycle accidents, sports injuries, and falls. Risk factors This condition is more likely to occur in people who:  Drink a lot of alcohol or use  drugs.  Are over the age of 58.  Are at risk for falls. What are the symptoms? There are many possible symptoms of a head injury. Visible symptoms of a head injury include a bruise, bump, or bleeding at the site of the injury. Other non-visible symptoms include:  Feeling sleepy or not being able to stay awake.  Passing out.  Headache.  Seizures.  Dizziness.  Confusion.  Memory problems.  Nausea or vomiting. Other possible symptoms that may develop after the head injury include:  Poor attention and concentration.  Fatigue or tiring easily.  Irritability.  Being uncomfortable around bright lights or loud noises.  Anxiety or depression.  Disturbed sleep. How is this diagnosed? This condition can usually be diagnosed based on your symptoms, a description of the injury, and a physical exam. You may also have imaging tests done, such as a CT scan or MRI. You will also be closely watched. How is this treated? Treatment for this condition depends on the severity and type of injury you have. The main goal of treatment is to prevent complications and allow the brain time to heal. For mild head injury, you may be sent home and treatment may include:  Observation. A responsible adult should stay with you for 24 hours after your injury and check on you often.  Physical rest.  Brain rest.  Pain medicines. For severe brain injury, treatment may include:  Close observation. This includes hospitalization with frequent physical exams. You may need to go to a hospital  that specializes in head injury.  Pain medicines.  Breathing support. This may include using a ventilator.  Managing the pressure inside the brain (intracranial pressure, or ICP). This may include:  Monitoring the ICP.  Giving medicines to decrease the ICP.  Positioning you to decrease the ICP.  Medicine to prevent seizures.  Surgery to stop bleeding or to remove blood clots (craniotomy).  Surgery to  remove part of the skull (decompressive craniectomy). This allows room for the brain to swell. Follow these instructions at home: Activity  Rest as much as possible and avoid activities that are physically hard or tiring.  Make sure you get enough sleep.  Limit activities that require a lot of thought or attention, such as:  Watching TV.  Playing memory games and puzzles.  Job-related work or homework.  Working on [employer], and texting.  Avoid activities that could cause another head injury, such as playing sports, until your health care provider approves. Having another head injury, especially before the first one has healed, can be dangerous.  Ask your health care provider when it is safe for you to return to your regular activities, including work or school. Ask your health care provider for a step-by-step plan for gradually returning to activities.  Ask your health care provider when you can drive, ride a bicycle, or use heavy machinery. Your ability to react may be slower after a brain injury. Never do these activities if you are dizzy. Lifestyle  Do not drink alcohol until your health care provider approves, and avoid drug use. Alcohol and certain drugs may slow your recovery and can put you at risk of further injury.  If it is harder than usual to remember things, write them down.  If you are easily distracted, try to do one thing at a time.  Talk with family members or close friends when making important decisions.  Tell your friends, family, a trusted colleague, and work Fre[employer] about your injury, symptoms, and restrictions. Have them watch for any new or worsening problems. General instructions  Take over-the-counter and prescription medicines only as told by your health care provider.  Have someone stay with you for 24 hours after your head injury. This person should watch you for any changes in your symptoms and be ready to seek medical help, as  needed.  Keep all follow-up visits as told by your health care provider. This is important. Prevention  Work on improving your balance and strength to avoid falls.  Wear a seatbelt when you are in a moving vehicle.  Wear a helmet when riding a bicycle, skiing, or doing any other sport or activity that has a risk of injury.  Drink alcohol only in moderation.  Take safety measures in your home, such as:  Removing clutter and tripping hazards from floors and stairways.  Using grab bars in bathrooms and handrails by stairs.  Placing non-slip mats on floors and in bathtubs.  Improving lighting in dim areas. Get help right away if:  You have:  A severe headache that is not helped by medicine.  Trouble walking, have weakness in your arms and legs, or lose your balance.  Clear or bloody fluid coming from your nose or ears.  Changes in your vision.  A seizure.  You vomit.  Your symptoms get worse.  Your speech is slurred.  You pass out.  You are sleepier and have trouble staying awake.  Your pupils change size. These symptoms may represent a serious problem  that is an emergency. Do not wait to see if the symptoms will go away. Get medical help right away. Call your local emergency services (911 in the [place] Do not drive yourself to the hospital.  This information is not intended to replace advice given to you by your health care provider. Make sure you discuss any questions you have with your health care provider. Document Released: 04/26/2005 Document Revised: 11/21/2015 Document Reviewed: 11/04/2015 Elsevier Interactive Patient Education  2017 [employer].

## 2016-05-11 NOTE — Progress Notes (Signed)
Subjective:  By signing my name below, I, [name], attest that this documentation has been prepared under the direction and in the presence of [name], MD Electronically Signed: Ladene Artist, ED Scribe 05/11/2016 at 9:04 AM.   Patient ID: [name], female    DOB: 1960/07/09, 56 y.o.   MRN: [id number]  Chief Complaint  Patient presents with  . Concussion    pt was walking her dog, and the dog dragged her and she was kicked unconc  . Generalized Body Aches    knee has brusing and scrap on face/ x 5 days  . Memory Loss   HPI HPI Comments: [name] is a 56 y.o. female who presents to the [facility] complaining of a syncopal episode that occurred 6 days ago. Pt states that she was out walking her dog 6 days ago when the dog saw a cat and chased after it. She states that she was jerked forward and struck her head on concrete. She noticed bruising on her forehead and both knees the following day as well as memory loss. She also reports fatigue, upper chest soreness and unchanged sob. Pt denies HA and chest pain. She states that she drove to a friend's home but she does not recall the events that occurred that night.   Depression Pt is seeing a counselor regularly which she states is helping her depression. [facility]   Pt's B12 level was checked 6 months ago and it was quite high. Her MMA has not been run.   Past Medical History:  Diagnosis Date  . Allergy    SEASONAL  . Anemia   . Anxiety   . Arthritis   . ASD (atrial septal defect)    a. s/p ASD repair in 1973  . CAD (coronary artery disease)    a. 12/2015: NSTEMI occurring after cardiac arrest secondary to aspiration PNA. Echo w/ EF of 55-60%, no WMA. Outpt ischemic eval needed.  . Cardiomyopathy   . Deaf   . Depression   . Dysphagia    due to esophageal infections.  . Fibromyalgia   . Gastritis   . GERD (gastroesophageal reflux disease)   . Hearing  impairment   . Hypertension    [name], take htn medication to regulate heart beat.  . Migraine   . Osteoporosis   . Pulmonary embolism ([place])    occured post c-section of her daughter  . Sleep apnea   . Vitamin D deficiency    Current Outpatient Prescriptions on File Prior to Visit  Medication Sig Dispense Refill  . aspirin 81 MG chewable tablet Chew 81 mg by mouth daily.    [name] atorvastatin (LIPITOR) 40 MG tablet Take 1 tablet (40 mg total) by mouth daily at 6 PM. 90 tablet 1  . benzonatate (TESSALON) 200 MG capsule Take 1 capsule (200 mg total) by mouth 3 (three) times daily as needed for cough. 60 capsule 0  . Cholecalciferol (VITAMIN D3) 2000 units TABS Take 1 tablet by mouth daily.    . folic acid (FOLVITE) 1 MG tablet Take 1 mg by mouth daily.    . Metoprolol Tartrate 75 MG TABS Take 75 mg by mouth 2 (two) times daily. 180 tablet 1  . nitroGLYCERIN (NITROSTAT) 0.4 MG SL tablet Place 1 tablet (0.4 mg total) under the tongue every 5 (five) minutes as needed for chest pain. 30 tablet 0  . Omega-3 Fatty Acids (  FISH OIL) 1000 MG CAPS Take 1,000 mg by mouth 2 (two) times daily.     . ondansetron (ZOFRAN ODT) 4 MG disintegrating tablet Take 1 tablet (4 mg total) by mouth every 8 (eight) hours as needed for nausea. 6 tablet 0  . ondansetron (ZOFRAN) 4 MG tablet Take 1 tablet (4 mg total) by mouth every 8 (eight) hours as needed for nausea or vomiting. 12 tablet 0  . pantoprazole (PROTONIX) 40 MG tablet TAKE 1 TABLET (40 MG TOTAL) BY MOUTH DAILY. 30 tablet 0  . prazosin (MINIPRESS) 2 MG capsule Take 2 mg by mouth every evening.    . Vilazodone HCl (VIIBRYD) 40 MG TABS Take 40 mg by mouth daily.    . capsicum (ZOSTRIX) 0.075 % topical cream Apply 1 application topically 3 (three) times daily. Use a latex glove to apply. DO NOT GET ON FACE. (Patient not taking: Reported on 05/11/2016) 56 g 0  . cetirizine HCl (ZYRTEC) 5 MG/5ML SYRP Take 10 mLs (10 mg total) by mouth daily. (Patient not taking:  Reported on 05/11/2016) 300 mL 2  . Dextromethorphan-Guaifenesin (MUCINEX DM MAXIMUM STRENGTH) 60-1200 MG TB12 Take 1 tablet by mouth every 12 (twelve) hours. (Patient not taking: Reported on 05/11/2016) 14 each 1  . fluticasone (FLONASE) 50 MCG/ACT nasal spray Place 2 sprays into both nostrils daily.    . mirabegron ER (MYRBETRIQ) 50 MG TB24 tablet Take 1 tablet (50 mg total) by mouth daily. (Patient not taking: Reported on 05/11/2016) 30 tablet 11  . vitamin B-12 (CYANOCOBALAMIN) 500 MCG tablet Take 500 mcg by mouth daily.     No current facility-administered medications on file prior to visit.    Allergies  Allergen Reactions  . Depakote [Divalproex Sodium] Anaphylaxis  . Diazepam Other (See Comments)    Died on operating table  . Valium Anaphylaxis  . Tizanidine Hcl Other (See Comments)    "passed out"   Review of Systems  Constitutional: Positive for fatigue.  Respiratory: Positive for shortness of breath (unchanged).   Cardiovascular: Negative for chest pain.  Skin: Positive for color change.  Neurological: Positive for syncope. Negative for headaches.  Psychiatric/Behavioral: Positive for confusion.      Objective:   Physical Exam  Constitutional: She is oriented to person, place, and time. She appears well-developed and well-nourished. No distress.  HENT:  Head: Normocephalic and atraumatic.  Right Ear: Tympanic membrane is scarred.  Left Ear: Tympanic membrane normal.  Nose: Nose normal.  Mouth/Throat: Oropharynx is clear and moist.  Eyes: Conjunctivae and EOM are normal. Pupils are equal, round, and reactive to light.  Normal funduscopic exam bilaterally.   Neck: Neck supple. No tracheal deviation present.  Cardiovascular: Normal rate.   Pulmonary/Chest: Effort normal. No respiratory distress.  Musculoskeletal: Normal range of motion.  FROM. No patellar instability. Mild crepitus in R knee.  Neurological: She is alert and oriented to person, place, and time. She has  normal reflexes. She displays atrophy. No cranial nerve deficit or sensory deficit. She exhibits normal muscle tone. She displays a negative Romberg sign. Coordination and gait normal.  Cranial nerves 2-12 intact. DTRs are normal and symmetric throughout. Normal finger to nose. Normal rapid alternating movement. Negative pronator drift. Slight resistance tremor, R worse than L. Inability to do 1 legged stance. Inability to do tandem gait. Good attention, concentration and immediate recall. Remember 3/3 words at 5 min. Strength symetrical and likely at baseline.  Skin: Skin is warm and dry.  Psychiatric: She has a normal  mood and affect. Her speech is normal and behavior is normal. Judgment and thought content normal. She exhibits normal recent memory and normal remote memory.  Nursing note and vitals reviewed. BP 116/64   Pulse 74   Temp 98.6 F (37 C) (Oral)   Resp 16   Ht 5\' 4"  (1.626 m)   Wt 130 lb 3.2 oz (59.1 kg)   SpO2 99%   BMI 22.35 kg/m     Assessment & Plan:   1. Concussion with loss of consciousness, initial encounter   Patient with concerning loss of memory of her fall and the several hours after. However since the day following and the 5 days since she has been back to her baseline and seems to be at her baseline neurologically and psychiatrically today. Reviewed importance of increasing her physical stamina over the next year. Resume water exercise classes at the [facility] and encouraged patient to increase her activity monthly to build back her stamina. Definitely needs to work on balance and core strength to avoid further falls and head injuries.  Meds ordered this encounter  Medications  . nitroGLYCERIN (NITROSTAT) 0.4 MG SL tablet    Sig: Place 1 tablet (0.4 mg total) under the tongue every 5 (five) minutes as needed for chest pain.    Dispense:  30 tablet    Refill:  0    I personally performed the services described in this documentation, which was scribed in my presence.  The recorded information has been reviewed and considered, and addended by me as needed.   [name], M.D.  [facility] [address] [place], [place] [address] [phone] phone [phone] fax  05/11/16 9:40 AM

## 2016-07-17 ENCOUNTER — Emergency Department (HOSPITAL_COMMUNITY): Payer: Medicare Other

## 2016-07-17 ENCOUNTER — Inpatient Hospital Stay (HOSPITAL_COMMUNITY)
Admission: EM | Admit: 2016-07-17 | Discharge: 2016-07-21 | DRG: 369 | Disposition: A | Discharge status: Home / Self Care | Payer: Medicare Other | Attending: Family Medicine | Admitting: Family Medicine

## 2016-07-17 ENCOUNTER — Encounter (HOSPITAL_COMMUNITY): Payer: Self-pay | Admitting: Nurse Practitioner

## 2016-07-17 DIAGNOSIS — R1013 Epigastric pain: Secondary | ICD-10-CM

## 2016-07-17 DIAGNOSIS — K210 Gastro-esophageal reflux disease with esophagitis: Secondary | ICD-10-CM | POA: Diagnosis present

## 2016-07-17 DIAGNOSIS — R079 Chest pain, unspecified: Secondary | ICD-10-CM | POA: Diagnosis present

## 2016-07-17 DIAGNOSIS — H9190 Unspecified hearing loss, unspecified ear: Secondary | ICD-10-CM | POA: Diagnosis present

## 2016-07-17 DIAGNOSIS — E876 Hypokalemia: Secondary | ICD-10-CM | POA: Diagnosis present

## 2016-07-17 DIAGNOSIS — R1084 Generalized abdominal pain: Secondary | ICD-10-CM | POA: Diagnosis not present

## 2016-07-17 DIAGNOSIS — D72819 Decreased white blood cell count, unspecified: Secondary | ICD-10-CM

## 2016-07-17 DIAGNOSIS — D61818 Other pancytopenia: Secondary | ICD-10-CM

## 2016-07-17 DIAGNOSIS — F411 Generalized anxiety disorder: Secondary | ICD-10-CM | POA: Diagnosis present

## 2016-07-17 DIAGNOSIS — K222 Esophageal obstruction: Secondary | ICD-10-CM

## 2016-07-17 DIAGNOSIS — K760 Fatty (change of) liver, not elsewhere classified: Secondary | ICD-10-CM | POA: Diagnosis present

## 2016-07-17 DIAGNOSIS — M1990 Unspecified osteoarthritis, unspecified site: Secondary | ICD-10-CM | POA: Diagnosis present

## 2016-07-17 DIAGNOSIS — D638 Anemia in other chronic diseases classified elsewhere: Secondary | ICD-10-CM | POA: Diagnosis present

## 2016-07-17 DIAGNOSIS — Z8674 Personal history of sudden cardiac arrest: Secondary | ICD-10-CM

## 2016-07-17 DIAGNOSIS — I252 Old myocardial infarction: Secondary | ICD-10-CM

## 2016-07-17 DIAGNOSIS — D696 Thrombocytopenia, unspecified: Secondary | ICD-10-CM | POA: Diagnosis not present

## 2016-07-17 DIAGNOSIS — E860 Dehydration: Secondary | ICD-10-CM | POA: Diagnosis present

## 2016-07-17 DIAGNOSIS — G4733 Obstructive sleep apnea (adult) (pediatric): Secondary | ICD-10-CM | POA: Diagnosis present

## 2016-07-17 DIAGNOSIS — R1310 Dysphagia, unspecified: Secondary | ICD-10-CM | POA: Diagnosis present

## 2016-07-17 DIAGNOSIS — B3781 Candidal esophagitis: Secondary | ICD-10-CM

## 2016-07-17 DIAGNOSIS — M810 Age-related osteoporosis without current pathological fracture: Secondary | ICD-10-CM | POA: Diagnosis present

## 2016-07-17 DIAGNOSIS — I429 Cardiomyopathy, unspecified: Secondary | ICD-10-CM | POA: Diagnosis present

## 2016-07-17 DIAGNOSIS — R109 Unspecified abdominal pain: Secondary | ICD-10-CM | POA: Diagnosis not present

## 2016-07-17 DIAGNOSIS — F1010 Alcohol abuse, uncomplicated: Secondary | ICD-10-CM | POA: Diagnosis present

## 2016-07-17 DIAGNOSIS — D7589 Other specified diseases of blood and blood-forming organs: Secondary | ICD-10-CM

## 2016-07-17 DIAGNOSIS — R05 Cough: Secondary | ICD-10-CM | POA: Diagnosis not present

## 2016-07-17 DIAGNOSIS — I2510 Atherosclerotic heart disease of native coronary artery without angina pectoris: Secondary | ICD-10-CM | POA: Diagnosis present

## 2016-07-17 DIAGNOSIS — Z86718 Personal history of other venous thrombosis and embolism: Secondary | ICD-10-CM

## 2016-07-17 DIAGNOSIS — Z7982 Long term (current) use of aspirin: Secondary | ICD-10-CM

## 2016-07-17 DIAGNOSIS — K449 Diaphragmatic hernia without obstruction or gangrene: Secondary | ICD-10-CM | POA: Diagnosis present

## 2016-07-17 DIAGNOSIS — I10 Essential (primary) hypertension: Secondary | ICD-10-CM | POA: Diagnosis present

## 2016-07-17 DIAGNOSIS — F329 Major depressive disorder, single episode, unspecified: Secondary | ICD-10-CM | POA: Diagnosis present

## 2016-07-17 DIAGNOSIS — R001 Bradycardia, unspecified: Secondary | ICD-10-CM

## 2016-07-17 DIAGNOSIS — Z86711 Personal history of pulmonary embolism: Secondary | ICD-10-CM

## 2016-07-17 DIAGNOSIS — I959 Hypotension, unspecified: Secondary | ICD-10-CM | POA: Diagnosis not present

## 2016-07-17 DIAGNOSIS — R55 Syncope and collapse: Secondary | ICD-10-CM

## 2016-07-17 DIAGNOSIS — M797 Fibromyalgia: Secondary | ICD-10-CM | POA: Diagnosis present

## 2016-07-17 DIAGNOSIS — Z79899 Other long term (current) drug therapy: Secondary | ICD-10-CM

## 2016-07-17 DIAGNOSIS — N390 Urinary tract infection, site not specified: Secondary | ICD-10-CM | POA: Diagnosis present

## 2016-07-17 DIAGNOSIS — E7800 Pure hypercholesterolemia, unspecified: Secondary | ICD-10-CM | POA: Diagnosis present

## 2016-07-17 LAB — CBC WITH DIFFERENTIAL/PLATELET
Basophils Absolute: 0.1 10*3/uL (ref 0.0–0.1)
Basophils Relative: 1 %
Eosinophils Absolute: 0.2 10*3/uL (ref 0.0–0.7)
Eosinophils Relative: 4 %
HEMATOCRIT: 36.2 % (ref 36.0–46.0)
HEMOGLOBIN: 12.4 g/dL (ref 12.0–15.0)
LYMPHS ABS: 0.8 10*3/uL (ref 0.7–4.0)
LYMPHS PCT: 22 %
MCH: 36 pg — AB (ref 26.0–34.0)
MCHC: 34.3 g/dL (ref 30.0–36.0)
MCV: 105.2 fL — AB (ref 78.0–100.0)
MONOS PCT: 18 %
Monocytes Absolute: 0.7 10*3/uL (ref 0.1–1.0)
NEUTROS ABS: 2 10*3/uL (ref 1.7–7.7)
NEUTROS PCT: 55 %
Platelets: 257 10*3/uL (ref 150–400)
RBC: 3.44 MIL/uL — AB (ref 3.87–5.11)
RDW: 15.3 % (ref 11.5–15.5)
WBC: 3.7 10*3/uL — AB (ref 4.0–10.5)

## 2016-07-17 LAB — URINALYSIS, ROUTINE W REFLEX MICROSCOPIC
Glucose, UA: NEGATIVE mg/dL
KETONES UR: 40 mg/dL — AB
NITRITE: POSITIVE — AB
PH: 6 (ref 5.0–8.0)
Protein, ur: 30 mg/dL — AB
SPECIFIC GRAVITY, URINE: 1.025 (ref 1.005–1.030)

## 2016-07-17 LAB — RAPID URINE DRUG SCREEN, HOSP PERFORMED
AMPHETAMINES: NOT DETECTED
BARBITURATES: NOT DETECTED
BENZODIAZEPINES: NOT DETECTED
COCAINE: NOT DETECTED
Opiates: NOT DETECTED
TETRAHYDROCANNABINOL: NOT DETECTED

## 2016-07-17 LAB — MAGNESIUM: Magnesium: 1.3 mg/dL — ABNORMAL LOW (ref 1.7–2.4)

## 2016-07-17 LAB — ETHANOL: Alcohol, Ethyl (B): 6 mg/dL — ABNORMAL HIGH (ref <5)

## 2016-07-17 LAB — URINALYSIS, MICROSCOPIC (REFLEX)
RBC / HPF: NONE SEEN RBC/hpf (ref 0–5)
SQUAMOUS EPITHELIAL / LPF: NONE SEEN
WBC, UA: NONE SEEN WBC/hpf (ref 0–5)

## 2016-07-17 LAB — COMPREHENSIVE METABOLIC PANEL
ALK PHOS: 77 U/L (ref 38–126)
ALT: 36 U/L (ref 14–54)
ANION GAP: 16 — AB (ref 5–15)
AST: 113 U/L — ABNORMAL HIGH (ref 15–41)
Albumin: 3.7 g/dL (ref 3.5–5.0)
BILIRUBIN TOTAL: 0.8 mg/dL (ref 0.3–1.2)
BUN: 9 mg/dL (ref 6–20)
CALCIUM: 8.7 mg/dL — AB (ref 8.9–10.3)
CO2: 23 mmol/L (ref 22–32)
CREATININE: 0.88 mg/dL (ref 0.44–1.00)
Chloride: 99 mmol/L — ABNORMAL LOW (ref 101–111)
GFR calc non Af Amer: >60 mL/min (ref >60)
Glucose, Bld: 87 mg/dL (ref 65–99)
Potassium: 3.7 mmol/L (ref 3.5–5.1)
Sodium: 138 mmol/L (ref 135–145)
TOTAL PROTEIN: 6.3 g/dL — AB (ref 6.5–8.1)

## 2016-07-17 LAB — I-STAT TROPONIN, ED: TROPONIN I, POC: 0 ng/mL (ref 0.00–0.08)

## 2016-07-17 LAB — TSH: TSH: 8.077 u[IU]/mL — ABNORMAL HIGH (ref 0.350–4.500)

## 2016-07-17 LAB — LIPASE, BLOOD: Lipase: 66 U/L — ABNORMAL HIGH (ref 11–51)

## 2016-07-17 LAB — MRSA PCR SCREENING: MRSA BY PCR: POSITIVE — AB

## 2016-07-17 LAB — TROPONIN I
Troponin I: <0.03 ng/mL (ref <0.03)
Troponin I: <0.03 ng/mL (ref <0.03)

## 2016-07-17 MED ORDER — MORPHINE SULFATE (PF) 4 MG/ML IV SOLN
4.0000 mg | Freq: Once | INTRAVENOUS | Status: AC
  Administered 2016-07-17: 4 mg via INTRAVENOUS
  Filled 2016-07-17: qty 1

## 2016-07-17 MED ORDER — ASPIRIN 81 MG PO CHEW
81.0000 mg | CHEWABLE_TABLET | Freq: Every day | ORAL | Status: DC
Start: 2016-07-18 — End: 2016-07-21
  Administered 2016-07-18 – 2016-07-21 (×4): 81 mg via ORAL
  Filled 2016-07-17 (×4): qty 1

## 2016-07-17 MED ORDER — KETOROLAC TROMETHAMINE 15 MG/ML IJ SOLN
15.0000 mg | Freq: Three times a day (TID) | INTRAMUSCULAR | Status: DC | PRN
  Administered 2016-07-17 – 2016-07-18 (×2): 15 mg via INTRAVENOUS
  Filled 2016-07-17 (×2): qty 1

## 2016-07-17 MED ORDER — NITROGLYCERIN 0.4 MG SL SUBL: 0.4000 mg | SUBLINGUAL_TABLET | SUBLINGUAL | Status: DC | PRN

## 2016-07-17 MED ORDER — ASPIRIN 81 MG PO CHEW
324.0000 mg | CHEWABLE_TABLET | Freq: Once | ORAL | Status: AC
  Administered 2016-07-17: 324 mg via ORAL
  Filled 2016-07-17: qty 4

## 2016-07-17 MED ORDER — ENOXAPARIN SODIUM 40 MG/0.4ML ~~LOC~~ SOLN
40.0000 mg | SUBCUTANEOUS | Status: DC
  Administered 2016-07-17 – 2016-07-18 (×2): 40 mg via SUBCUTANEOUS
  Filled 2016-07-17 (×2): qty 0.4

## 2016-07-17 MED ORDER — ONDANSETRON HCL 4 MG/2ML IJ SOLN
4.0000 mg | Freq: Four times a day (QID) | INTRAMUSCULAR | Status: DC | PRN
  Administered 2016-07-17 – 2016-07-19 (×3): 4 mg via INTRAVENOUS
  Filled 2016-07-17 (×3): qty 2

## 2016-07-17 MED ORDER — DEXTROSE 5 % IV SOLN
1.0000 g | Freq: Once | INTRAVENOUS | Status: AC
  Administered 2016-07-17: 1 g via INTRAVENOUS
  Filled 2016-07-17: qty 10

## 2016-07-17 MED ORDER — VILAZODONE HCL 40 MG PO TABS
40.0000 mg | ORAL_TABLET | Freq: Every day | ORAL | Status: DC
  Administered 2016-07-18 – 2016-07-21 (×3): 40 mg via ORAL
  Filled 2016-07-17 (×8): qty 1

## 2016-07-17 MED ORDER — SODIUM CHLORIDE 0.9 % IV BOLUS (SEPSIS): 1000.0000 mL | Freq: Once | INTRAVENOUS | Status: DC

## 2016-07-17 MED ORDER — METOPROLOL TARTRATE 25 MG PO TABS
75.0000 mg | ORAL_TABLET | Freq: Two times a day (BID) | ORAL | Status: DC
  Administered 2016-07-18 – 2016-07-21 (×4): 75 mg via ORAL
  Filled 2016-07-17 (×6): qty 3

## 2016-07-17 MED ORDER — GI COCKTAIL ~~LOC~~
30.0000 mL | Freq: Four times a day (QID) | ORAL | Status: DC | PRN
  Administered 2016-07-17 – 2016-07-18 (×3): 30 mL via ORAL
  Filled 2016-07-17 (×4): qty 30

## 2016-07-17 MED ORDER — ACETAMINOPHEN 325 MG PO TABS
650.0000 mg | ORAL_TABLET | ORAL | Status: DC | PRN
  Administered 2016-07-17: 650 mg via ORAL
  Filled 2016-07-17 (×2): qty 2

## 2016-07-17 MED ORDER — ONDANSETRON HCL 4 MG/2ML IJ SOLN
4.0000 mg | Freq: Once | INTRAMUSCULAR | Status: AC
  Administered 2016-07-17: 4 mg via INTRAVENOUS
  Filled 2016-07-17: qty 2

## 2016-07-17 MED ORDER — SODIUM CHLORIDE 0.9 % IV BOLUS (SEPSIS)
1000.0000 mL | Freq: Once | INTRAVENOUS | Status: AC
  Administered 2016-07-17: 1000 mL via INTRAVENOUS

## 2016-07-17 MED ORDER — FOLIC ACID 1 MG PO TABS
1.0000 mg | ORAL_TABLET | Freq: Every day | ORAL | Status: DC
  Administered 2016-07-17 – 2016-07-21 (×5): 1 mg via ORAL
  Filled 2016-07-17 (×5): qty 1

## 2016-07-17 MED ORDER — ATORVASTATIN CALCIUM 40 MG PO TABS
40.0000 mg | ORAL_TABLET | Freq: Every day | ORAL | Status: DC
  Administered 2016-07-18 – 2016-07-20 (×3): 40 mg via ORAL
  Filled 2016-07-17 (×4): qty 1

## 2016-07-17 MED ORDER — VITAMIN B-1 100 MG PO TABS
100.0000 mg | ORAL_TABLET | Freq: Every day | ORAL | Status: DC
  Administered 2016-07-17 – 2016-07-21 (×5): 100 mg via ORAL
  Filled 2016-07-17 (×5): qty 1

## 2016-07-17 MED ORDER — PANTOPRAZOLE SODIUM 40 MG PO TBEC
40.0000 mg | DELAYED_RELEASE_TABLET | Freq: Every day | ORAL | Status: DC
  Administered 2016-07-17 – 2016-07-18 (×2): 40 mg via ORAL
  Filled 2016-07-17 (×2): qty 1

## 2016-07-17 NOTE — H&P (Signed)
[facility] Admission History and Physical Service Pager: [phone]  Patient name: [name] Medical record number: [id number] Date of birth: 06-04-1960 Age: 56 y.o. Gender: female   Primary Care Provider: [name], MD Consultants: None  Code Status: Full code  Chief Complaint: Chest and abdominal pain  Assessment and Plan: [name] is a 56 y.o. female presenting with squeezing abdominal pain along with nausea and vomiting. PMH is significant for alcohol abuse, major depressive disorder, NSTEMI with cardiac arrest, GERD, recurrent aspiration, hypertension, dysphagia, hyperlipidemia  Chest pain:  Non-exertional centralized chest pain that came on acutely after taking pill and it got stuck in her throat. Reportedly resolved after one hour and she denied any radiation down her arms or neck. Had NSTEMI and cardiac arrest in August 2017 while being hospitalized for ketoacidosis. Had nuclear stress test in 9/17 that showed no ischemia and has not followed up since. Has associated abdominal pain described as squeezing as well as nausea and vomiting. EKG showed new t-wave inversions in leads III, AVF, V3, and V4 but no ST changes. First troponin and CXR negative. Vital signs stable and pain is non-reproducible. Denies respiratory symptoms, clear lungs and normal chest x-ray unlikely to be pneumonia. Wells score 1.5 with stable vital signs and normal EKG, unlikely to be PE (of note she does have a history of PE after giving birth to her daughter years ago).  Likely GERD vs stable angina vs referred pain from UTI.  - Admit for observation with telemetry under attending Mingo Amber - Continuous cardiac monitoring - Trend troponins - AM EKG - Nitroglycerin 0.4 mg every 5 minutes PRN chest pain - GI cocktail - Protonix - Vitals per floor protocol - Tylenol PRN pain - Cardiology consult if troponins trended upward  Abdominal pain with nausea and vomiting: History of GERD  and takes Protonix daily also with history of alcohol abuse presenting with squeezing abdominal pain rated 8 out of 10 with associated nausea and vomiting.  Vomit is none bloody nonbilious and most recently in the emergency department. Abdomen is moderately tender to palpation in epigastrium without peritoneal signs and no palpable masses. Last bowel movement was yesterday and she reports passing gas today and has not eaten much but has been able to take in some liquids.  - GI cocktail and Protonix - Tylenol PRN pain, we will escalate pain medication as needed - consider abdominal plain films - consider GI consult if no improvement  Urinary tract infection: Denies urinary symptoms, but did have some mild suprapubic tenderness. No CVA tenderness. UA showed positive nitrites.  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 Unable to find information about this diagnosis under chart review. States that she has had trouble swallowing and having food getting stuck in her throat since age 14. Required an EGD to remove pill that was stuck once in the past. Previously seen by gastro[facility]ology at [facility] who attributed intermittent dysphagia to recurrent esophageal candidiasis.  Apparently inciting incident this morning was after a pill got stuck in her throat which led to one hour of chest pain.  - SLP swallow eval - Full liquid diet  - Consider GI versus ENT consult   Hypertension: Initial blood pressure 92/59, which improved to 115/78 with fluid. Takes metoprolol tartrate 75 mg twice a day and prazosin 2 mg daily.  - Holding home medication for low blood pressure  GERD: Takes 40 mg Protonix daily - Continue  Protonix  History of alcohol abuse: States that she quit alcohol a few months ago, but used to drink a pint of vodka a day for many years. - CIWA protocol - Add UDS and alcohol level  Hearing impairment: Reported to have hearing  impairment secondary to recurrent ear infections. Patient indicates by reading lips  Major depressive disorder/generalized anxiety disorder: Patient takes volazodone 40 mg daily - continue volazodone 40 mg daily  Hyperlipidemia: Takes atorvastatin 40 mg daily. -Continue atorvastatin  Hx of cardiac arrest and cardiac surgery: Hx of 6 minute cardiac arrest after respiratory failure after choking on a pill, was found to have NSTEMI afterwards; this was all in August 2017. S/p ASD and MV repair as well. Had nuclear stress test in 9/17 which was negative. Last saw cardiologist in Oct 2017 was was supposed to follow up in 4 months  Mild Leukopenia: WBC 3.7.  - given hx of recurrent esophageal candidiasis, will order HIV - daily CBC  FEN/GI: Nothing by mouth with sips/Protonix Prophylaxis: Lovenox  Disposition: Admit for observation with telemetry, discharge pending ACS rule out and improvement in abdominal pain, nausea and vomiting.   History of Present Illness:  [name] is a 56 y.o. female presenting with chest pain, heart racing, and abdominal pain since this morning.   Patient took a pill th patient woke up this morning stating that her heart was racing and decided to take her daily metoprolol to try to relieve her symptoms. 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 She notes that she usually has to chew her food very well so nothing gets stuck. She has had food removed from her esophagus before via endoscopy. After "the pill got stuck" she felt a "twinging" pain in the center of her chest x 1 hour. She feels like the pill may have passed as the chest pain resolved once she got to the ED. Although now she has abdominal pain. Patient also admits that her biggest concern is "squeezing" abdominal pain 8/10 since this morning associated with nausea and NBNB emesis x1.  Of note, patient has been in cardiac arrest before secondary to choking on a pill in August 2017. Notes  that she has previously seen [facility]. She currently denies chest pain, shortness of breath, diarrhea, fever, chills, lower extremity edema or myalgias.  Review Of Systems: Per HPI   ROS  Patient Active Problem List   Diagnosis Date Noted  . Blepharitis of both eyes 02/03/2016  . Cardiac arrest ([place]) 12/22/2015  . Uncontrolled hypertension   . Aspiration pneumonia of right lower lobe due to gastric secretions ([place])   . Hypokalemia   . Pneumonia   . NSTEMI (non-ST elevated myocardial infarction) ([place]) 12/19/2015  . Typical atrial flutter ([place])   . Pressure ulcer 12/18/2015  . Protein-calorie malnutrition, severe 12/18/2015  . Metabolic acidosis 29/93/7169  . Lactic acidosis 12/17/2015  . Leucocytosis 12/17/2015  . Dehydration 12/17/2015  . Starvation ketoacidosis 12/17/2015  . Alcohol abuse 12/17/2015  . Increased anion gap metabolic acidosis 67/89/3810  . Essential hypertension 12/06/2015  . Accidental drug overdose 10/26/2015  . Aspiration pneumonia ([place]) 10/23/2015  . Acute respiratory failure with hypoxia ([place]) 10/23/2015  . Acute kidney injury ([place]) 10/23/2015  . Transaminitis 10/23/2015  . Positive blood culture 10/23/2015  . Elevated troponin 10/23/2015  . Thrombocytopenia ([place]) 10/23/2015  . Normocytic anemia 10/23/2015  . Shock ([place]) 10/19/2015  . Major depressive disorder, recurrent episode, moderate ([place]) 09/18/2014  . Non-traumatic  compression fracture of vertebral column with routine healing 07/17/2014  . Osteoporosis 07/17/2014  . H/O vitamin D deficiency 07/17/2014  . Odynophagia 07/09/2014  . Dysphagia 07/08/2014  . Chest pain 05/20/2014  . Nausea & vomiting 05/20/2014  . 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 Major depression, chronic ([place]) 03/29/2014  . Breath shortness 03/29/2014  . Hypercholesterolemia without hypertriglyceridemia 03/29/2014  . Fatigue 03/29/2014  . Classical migraine with intractable migraine  03/29/2014  . Disorder of mitral valve 03/29/2014  . Atypical migraine 03/29/2014  . Atrial septal defect of fossa ovalis 03/29/2014  . HLD (hyperlipidemia) 03/29/2014  . Arthralgia of multiple joints 03/29/2014  . Anemia, iron deficiency 03/29/2014  . Fibrositis 03/29/2014  . Abdominal pain 12/20/2013  . Absolute anemia 09/20/2013  . Bronchitis 09/20/2013  . Vaginitis and vulvovaginitis 09/20/2013  . Family history of breast cancer 07/13/2013  . Palpitation 07/02/2013  . History of mitral valve repair 07/02/2013  . Status post patch closure of ASD 07/02/2013  . Hyperlipidemia 07/02/2013  . OAB (overactive bladder) 06/13/2013  . Ovarian cyst, left 03/15/2013  . Vaginal lesion 03/15/2013  . Ovarian mass 03/15/2013  . Maternal DVT (deep vein thrombosis), history of 02/26/2013  . PMB (postmenopausal bleeding) 02/26/2013  . Alcohol dependence ([place]) 11/07/2012  . Unspecified vitamin D deficiency 08/17/2012  . L1 vertebral fracture ([place]) 08/01/2012  . Spinal compression fracture ([place]) 07/12/2012  . Menopausal state 07/12/2012  . Psoriasis 07/12/2012  . Severe episode of recurrent major depressive disorder ([place]) 12/07/2011  . Generalized anxiety disorder 12/07/2011  . Cardiomyopathy 03/16/2011  . Hearing impairment   . Migraine   . Arthritis     Past Medical History: Past Medical History:  Diagnosis Date  . Allergy    SEASONAL  . Anemia   . Anxiety   . Arthritis   . ASD (atrial septal defect)    a. s/p ASD repair in 1973  . CAD (coronary artery disease)    a. 12/2015: NSTEMI occurring after cardiac arrest secondary to aspiration PNA. Echo w/ EF of 55-60%, no WMA. Outpt ischemic eval needed.  . Cardiomyopathy   . Deaf   . Depression   . Dysphagia    due to esophageal infections.  . Fibromyalgia   . Gastritis   . GERD (gastroesophageal reflux disease)   . Hearing impairment   . Hypertension    [name], take htn medication to regulate heart beat.  . Migraine   .  Osteoporosis   . Pulmonary embolism ([place])    occured post c-section of her daughter  . Sleep apnea   . Vitamin D deficiency     Past Surgical History: Past Surgical History:  Procedure Laterality Date  . APPENDECTOMY    . CARDIAC SURGERY  January 2007   mitral valve repair  . CARDIAC SURGERY  1973   atrial septal defect  . CARDIAC SURGERY    . COCHLEAR IMPLANT    . INNER EAR SURGERY     TUBES  . LAPAROSCOPIC CHOLECYSTECTOMY  2012  . TONSILLECTOMY AND ADENOIDECTOMY    . TUBAL LIGATION      Social History: Social History  Substance Use Topics  . Smoking status: Never Smoker  . Smokeless tobacco: Never Used  . 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    Family History: Family History  Problem Relation Age of Onset  . Breast cancer Mother     bilateral; ages  59 and 69; TAH/BSO ~50  . Depression Sister   . Heart disease Father   . Hypertension Father   . Colon cancer Neg Hx   . Esophageal cancer Neg Hx   . Pancreatic cancer Neg Hx   . Rectal cancer Neg Hx   . Stomach cancer Neg Hx     Allergies and Medications: Allergies  Allergen Reactions  . Depakote [Divalproex Sodium] Anaphylaxis  . Diazepam Other (See Comments)    Died on operating table  . Valium Anaphylaxis  . Tizanidine Hcl Other (See Comments)    "passed out"   No current facility-administered medications on file prior to encounter.    Current Outpatient Prescriptions on File Prior to Encounter  Medication Sig Dispense Refill  . aspirin 81 MG chewable tablet Chew 81 mg by mouth daily.    [name] atorvastatin (LIPITOR) 40 MG tablet Take 1 tablet (40 mg total) by mouth daily at 6 PM. 90 tablet 1  . benzonatate (TESSALON) 200 MG capsule Take 1 capsule (200 mg total) by mouth 3 (three) times daily as needed for cough. 60 capsule 0  . Cholecalciferol (VITAMIN D3) 2000 units TABS Take 1 tablet by mouth daily.    . fluticasone (FLONASE) 50 MCG/ACT nasal spray Place  2 sprays into both nostrils daily.    . folic acid (FOLVITE) 1 MG tablet Take 1 mg by mouth daily.    . Metoprolol Tartrate 75 MG TABS Take 75 mg by mouth 2 (two) times daily. 180 tablet 1  . nitroGLYCERIN (NITROSTAT) 0.4 MG SL tablet Place 1 tablet (0.4 mg total) under the tongue every 5 (five) minutes as needed for chest pain. 30 tablet 0  . Omega-3 Fatty Acids (FISH OIL) 1000 MG CAPS Take 1,000 mg by mouth 2 (two) times daily.     . ondansetron (ZOFRAN ODT) 4 MG disintegrating tablet Take 1 tablet (4 mg total) by mouth every 8 (eight) hours as needed for nausea. 6 tablet 0  . ondansetron (ZOFRAN) 4 MG tablet Take 1 tablet (4 mg total) by mouth every 8 (eight) hours as needed for nausea or vomiting. 12 tablet 0  . pantoprazole (PROTONIX) 40 MG tablet TAKE 1 TABLET (40 MG TOTAL) BY MOUTH DAILY. 30 tablet 0  . prazosin (MINIPRESS) 2 MG capsule Take 2 mg by mouth every evening.    . Vilazodone HCl (VIIBRYD) 40 MG TABS Take 40 mg by mouth daily.    . vitamin B-12 (CYANOCOBALAMIN) 500 MCG tablet Take 500 mcg by mouth daily.      Objective: BP 97/61   Pulse 72   Temp 98.1 F (36.7 C) (Oral)   Resp 18   SpO2 95%  Exam: General: 56 year old female sitting up in bed appearing comfortable and in no acute distress. Appears older than stated age Eyes: EOMI, PERRL, non-injected, anicteric ENTM: No nasal discharge, MMM, clear oropharynx, missing some teeth Neck:  Supple, no LAD Cardiovascular: RRR, no MRG, 2+ palpable pulses Respiratory: no increased WOB, CTABL, no wheezing or rhochi Gastrointestinal: Soft, moderate epigastric tenderness without peritoneal signs, no distention and no palpable masses, +BS MSK: No gross deformities, no edema, full range of motion Derm: warm and dry Neuro: AAOx3, CN2-12, sensation intact, 5/5 strength throughout Psych: Normal mood and affect  Labs and Imaging: CBC BMET   Recent Labs Lab 07/17/16 1153  WBC 3.7*  HGB 12.4  HCT 36.2  PLT 257    Recent  Labs Lab 07/17/16 1153  NA 138  K 3.7  CL 99*  CO2 23  BUN 9  CREATININE 0.88  GLUCOSE 87  CALCIUM 8.7*     Dg Chest 2 View  Result Date: 07/17/2016 CLINICAL DATA:  chest and abdominal pain and racing heart that began when she woke this morning. She reports weakness, dizziness, dry heaves. She denies SOB,cough. She tried to take her daily meds with no relief. She is concerned that a pill may be stuck in her throat because she has had this happen in the past and she had to have endoscopy to remove the pill. Hx of atrial septal defect, coronary artery disease, cardiomyopathy, hypertension and pulmonary embolism. Hx of mitral valve repair in 2007 and atrial septal defect in 1973. EXAM: CHEST - 2 VIEW COMPARISON:  03/15/2016 FINDINGS: Lungs are clear. Heart size and mediastinal contours are within normal limits. No effusion. Previous median sternotomy and mitral valve surgery. Compression deformity of the visualized upper lumbar spine, stable. Surgical clips in the upper abdomen. IMPRESSION: 1. No acute disease. Electronically Signed   By: Lucrezia Europe M.D.   On: 07/17/2016 13:13   Rosana Berger, MD 07/17/2016, 3:53 PM PGY-1, [place] Intern pager: [phone], text pages welcome  I have separately seen and examined the patient. I have discussed the findings and exam with Dr. Rosalyn Gess and agree with the above note.  My changes/additions are outlined in BLUE.   Smitty Cords, MD [place], PGY-2

## 2016-07-17 NOTE — ED Provider Notes (Signed)
[facility] DEPT Provider Note   CSN: [id number] Arrival date & time: 07/17/16  1117  By signing my name below, I, Hilbert Odor, attest that this documentation has been prepared under the direction and in the presence of [facility], PA-C. Electronically Signed: Hilbert Odor, Scribe. 07/17/16. 12:24 PM. History   Chief Complaint Chief Complaint  Patient presents with  . Chest Pain  . Abdominal Pain  . Tachycardia   The patient was offered an interpreter due to hearing impairment which she declined. The history is provided by the patient. No language interpreter was used.   HPI Comments: [name] is a 56 y.o. female who presents to the Emergency Department complaining of chest pain, epigastric abdominal pain, and palpitations that began early this morning. She described her chest pain as a "twinge". She states that her chest pain was constant, lasting for about an hour and then resolved. Chest pain free currently. Additionally, the patient states  this morning she noticed that her heart was beating fast. She took her daily medication of metoprolol which often alleviates symptoms. While she was taking her medication with some water, she felt as if her medication became stuck in her throat. 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 She states that she began to vomit due to the pill becoming stuck. She drank several sips of water and now feels as if pill has passed. Since this occurred, she has been experiencing abdominal pain and nausea. Feels like she "may have thrown up" but believes it may have just been her water coming back up. She has had difficulty swallowing a pill in the past and had to have an endoscopy to remove the pill. She denies diarrhea, constipation, SOB, fever, congestion and cough.   Past Medical History:  Diagnosis Date  . Allergy    SEASONAL  . Anemia   . Anxiety   . Arthritis   . ASD (atrial septal defect)    a. s/p ASD repair in 1973  . CAD (coronary artery disease)    a. 12/2015: NSTEMI occurring after cardiac arrest secondary to aspiration PNA. Echo w/ EF of 55-60%, no WMA. Outpt ischemic eval needed.  . Cardiomyopathy   . Deaf   . Depression   . Dysphagia    due to esophageal infections.  . Fibromyalgia   . Gastritis   . GERD (gastroesophageal reflux disease)   . Hearing impairment   . Hypertension    [name], take htn medication to regulate heart beat.  . Migraine   . Osteoporosis   . Pulmonary embolism ([place])    occured post c-section of her daughter  . Sleep apnea   . Vitamin D deficiency     Patient Active Problem List   Diagnosis Date Noted  . Blepharitis of both eyes 02/03/2016  . Cardiac arrest ([place]) 12/22/2015  . Uncontrolled hypertension   . Aspiration pneumonia of right lower lobe due to gastric secretions ([place])   . Hypokalemia   . Pneumonia   . NSTEMI (non-ST elevated myocardial infarction) ([place]) 12/19/2015  . Typical atrial flutter ([place])   . Pressure ulcer 12/18/2015  . Protein-calorie malnutrition, severe 12/18/2015  . Metabolic acidosis 42/68/3419  . Lactic acidosis 12/17/2015  . Leucocytosis 12/17/2015  . Dehydration 12/17/2015  . Starvation ketoacidosis 12/17/2015  . Alcohol abuse 12/17/2015  . Increased anion gap metabolic acidosis 62/22/9798  . Essential hypertension 12/06/2015  . Accidental drug overdose 10/26/2015  . Aspiration pneumonia ([place]) 10/23/2015  .  Acute respiratory failure with hypoxia ([place]) 10/23/2015  . Acute kidney injury ([place]) 10/23/2015  . Transaminitis 10/23/2015  . Positive blood culture 10/23/2015  . Elevated troponin 10/23/2015  . Thrombocytopenia ([place]) 10/23/2015  . Normocytic anemia 10/23/2015  . Shock ([place]) 10/19/2015  . Major depressive disorder, recurrent episode, moderate ([place]) 09/18/2014  . Non-traumatic compression fracture of vertebral column with routine healing 07/17/2014  . Osteoporosis 07/17/2014  . H/O vitamin D  deficiency 07/17/2014  . Odynophagia 07/09/2014  . Dysphagia 07/08/2014  . Chest pain 05/20/2014  . Nausea & vomiting 05/20/2014  . 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 Major depression, chronic ([place]) 03/29/2014  . Breath shortness 03/29/2014  . Hypercholesterolemia without hypertriglyceridemia 03/29/2014  . Fatigue 03/29/2014  . Classical migraine with intractable migraine 03/29/2014  . Disorder of mitral valve 03/29/2014  . Atypical migraine 03/29/2014  . Atrial septal defect of fossa ovalis 03/29/2014  . HLD (hyperlipidemia) 03/29/2014  . Arthralgia of multiple joints 03/29/2014  . Anemia, iron deficiency 03/29/2014  . Fibrositis 03/29/2014  . Abdominal pain 12/20/2013  . Absolute anemia 09/20/2013  . Bronchitis 09/20/2013  . Vaginitis and vulvovaginitis 09/20/2013  . Family history of breast cancer 07/13/2013  . Palpitation 07/02/2013  . History of mitral valve repair 07/02/2013  . Status post patch closure of ASD 07/02/2013  . Hyperlipidemia 07/02/2013  . OAB (overactive bladder) 06/13/2013  . Ovarian cyst, left 03/15/2013  . Vaginal lesion 03/15/2013  . Ovarian mass 03/15/2013  . Maternal DVT (deep vein thrombosis), history of 02/26/2013  . PMB (postmenopausal bleeding) 02/26/2013  . Alcohol dependence ([place]) 11/07/2012  . Unspecified vitamin D deficiency 08/17/2012  . L1 vertebral fracture ([place]) 08/01/2012  . Spinal compression fracture ([place]) 07/12/2012  . Menopausal state 07/12/2012  . Psoriasis 07/12/2012  . Severe episode of recurrent major depressive disorder ([place]) 12/07/2011  . Generalized anxiety disorder 12/07/2011  . Cardiomyopathy 03/16/2011  . Hearing impairment   . Migraine   . Arthritis     Past Surgical History:  Procedure Laterality Date  . APPENDECTOMY    . CARDIAC SURGERY  January 2007   mitral valve repair  . CARDIAC SURGERY  1973   atrial septal defect  . CARDIAC SURGERY    . COCHLEAR IMPLANT    .  INNER EAR SURGERY     TUBES  . LAPAROSCOPIC CHOLECYSTECTOMY  2012  . TONSILLECTOMY AND ADENOIDECTOMY    . TUBAL LIGATION      OB History    Gravida Para Term Preterm AB Living   2 2 2  0 0     SAB TAB Ectopic Multiple Live Births   0 0 0           Home Medications    Prior to Admission medications   Medication Sig Start Date End Date Taking? Authorizing Provider  aspirin 81 MG chewable tablet Chew 81 mg by mouth daily.    Historical Provider, MD  atorvastatin (LIPITOR) 40 MG tablet Take 1 tablet (40 mg total) by mouth daily at 6 PM. 02/02/16   [name], MD  benzonatate (TESSALON) 200 MG capsule Take 1 capsule (200 mg total) by mouth 3 (three) times daily as needed for cough. 04/13/16   [name], MD  Cholecalciferol (VITAMIN D3) 2000 units TABS Take 1 tablet by mouth daily.    Historical Provider, MD  fluticasone (FLONASE) 50 MCG/ACT nasal spray Place 2 sprays into both nostrils daily.    Historical Provider, MD  folic acid (  FOLVITE) 1 MG tablet Take 1 mg by mouth daily.    Historical Provider, MD  Metoprolol Tartrate 75 MG TABS Take 75 mg by mouth 2 (two) times daily. 02/02/16   [name], MD  nitroGLYCERIN (NITROSTAT) 0.4 MG SL tablet Place 1 tablet (0.4 mg total) under the tongue every 5 (five) minutes as needed for chest pain. 05/11/16   [name], MD  Omega-3 Fatty Acids (FISH OIL) 1000 MG CAPS Take 1,000 mg by mouth 2 (two) times daily.     Historical Provider, MD  ondansetron (ZOFRAN ODT) 4 MG disintegrating tablet Take 1 tablet (4 mg total) by mouth every 8 (eight) hours as needed for nausea. 11/08/15   [name], MD  ondansetron (ZOFRAN) 4 MG tablet Take 1 tablet (4 mg total) by mouth every 8 (eight) hours as needed for nausea or vomiting. 82/4/23   Delora Fuel, MD  pantoprazole (PROTONIX) 40 MG tablet TAKE 1 TABLET (40 MG TOTAL) BY MOUTH DAILY. 53/6/14   Delora Fuel, MD  prazosin (MINIPRESS) 2 MG capsule Take 2 mg by mouth every evening. 02/17/16   Historical Provider, MD    Vilazodone HCl (VIIBRYD) 40 MG TABS Take 40 mg by mouth daily.    Historical Provider, MD  vitamin B-12 (CYANOCOBALAMIN) 500 MCG tablet Take 500 mcg by mouth daily.    Historical Provider, MD    Family History Family History  Problem Relation Age of Onset  . Breast cancer Mother     bilateral; ages 23 and 1; TAH/BSO ~50  . Depression Sister   . Heart disease Father   . Hypertension Father   . Colon cancer Neg Hx   . Esophageal cancer Neg Hx   . Pancreatic cancer Neg Hx   . Rectal cancer Neg Hx   . Stomach cancer Neg Hx     Social History Social History  Substance Use Topics  . Smoking status: Never Smoker  . Smokeless tobacco: Never Used  . 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     Allergies   Depakote [divalproex sodium]; Diazepam; Valium; and Tizanidine hcl   Review of Systems Review of Systems  Constitutional: Negative for fever.  Respiratory: Negative for cough and shortness of breath.   Cardiovascular: Positive for chest pain and palpitations.  Gastrointestinal: Positive for abdominal pain, nausea and vomiting. Negative for diarrhea.  All other systems reviewed and are negative.    Physical Exam Updated Vital Signs BP 101/69 (BP Location: Left Arm)   Pulse 80   Temp 98.1 F (36.7 C) (Oral)   Resp 18   SpO2 100%   Physical Exam  Constitutional: She is oriented to person, place, and time. She appears well-developed and well-nourished. No distress.  HENT:  Head: Normocephalic and atraumatic.  Cardiovascular: Normal rate, regular rhythm and normal heart sounds.   No murmur heard. Pulmonary/Chest: Effort normal and breath sounds normal. No respiratory distress.  Abdominal: Soft. She exhibits no distension. There is generalized tenderness.  Musculoskeletal: She exhibits no edema.  Neurological: She is alert and oriented to person, place, and time.  Skin: Skin is warm and dry.  Nursing note and vitals  reviewed.   ED Treatments / Results  DIAGNOSTIC STUDIES: Oxygen Saturation is 100% on RA, normal by my interpretation.    COORDINATION OF CARE: 12:09 PM Discussed treatment plan with pt at bedside and pt agreed to plan. I will check the patient's CT  abdomen and CXR. I will also check her labs.  Labs (all labs ordered are listed, but only abnormal results are displayed) Labs Reviewed  CBC WITH DIFFERENTIAL/PLATELET  COMPREHENSIVE METABOLIC PANEL  LIPASE, BLOOD  URINALYSIS, ROUTINE W REFLEX MICROSCOPIC  I-STAT TROPOININ, ED    EKG  EKG Interpretation None       Radiology No results found.  Procedures Procedures (including critical care time)  Medications Ordered in ED Medications  ondansetron (ZOFRAN) injection 4 mg (not administered)  sodium chloride 0.9 % bolus 1,000 mL (not administered)  morphine 4 MG/ML injection 4 mg (not administered)     Initial Impression / Assessment and Plan / ED Course  I have reviewed the triage vital signs and the nursing notes.  Pertinent labs & imaging results that were available during my care of the patient were reviewed by me and considered in my medical decision making (see chart for details).  [name] is a 56 y.o. female who presents to ED for chest pain. History of cardiac arrest in the past which was thought to be 2/2 respiratory failure in the setting of aspiration. NUC study performed in 01/2016 which was reassuring with no ischemia. Today, patient with new T-wave changes in leads 3, aVF, V3, V4. Troponin and CXR negative. Patient additionally complaining of abdominal pain: UA nitrite positive with trace leuks, urine cx obtained. Rocephin given following urine culture. Given cardiac history and new EKG changes, will admit. Family medicine teaching service consulted who will admit.    Final Clinical Impressions(s) / ED Diagnoses   Final diagnoses:  Chest pain  Chest pain  Abdominal pain    New Prescriptions New  Prescriptions   No medications on file   I personally performed the services described in this documentation, which was scribed in my presence. The recorded information has been reviewed and is accurate.    [facility] Krystin Keeven, PA-C 07/17/16 1549    Isla Pence, MD 07/17/16 [phone]

## 2016-07-17 NOTE — ED Triage Notes (Addendum)
Pt presents with c/o chest and abdominal pain and racing heart that began when she woke this morning. She reports weakness, dizziness, dry heaves. She denies SOB,cough.  She tried to take her daily meds with no relief. She is concerned that a pill may be stuck in her throat because she has had this happen in the past and she had to have endoscopy to remove the pill. She is hearing impaired but can read lips and does not request an interpreter.

## 2016-07-17 NOTE — Progress Notes (Signed)
Dear Doctor: This patient has been identified as a candidate for PICC for the following reason (s): IV therapy over 48 hours and poor veins/poor circulatory system (CHF, COPD, emphysema, diabetes, steroid use, IV drug abuse, etc.) If you agree, please write an order for the indicated device. For any questions contact the Vascular Access Team at [phone] if no answer, please leave a message.  Thank you for supporting the early vascular access assessment program. 

## 2016-07-18 DIAGNOSIS — I959 Hypotension, unspecified: Secondary | ICD-10-CM | POA: Diagnosis not present

## 2016-07-18 DIAGNOSIS — M1990 Unspecified osteoarthritis, unspecified site: Secondary | ICD-10-CM | POA: Diagnosis present

## 2016-07-18 DIAGNOSIS — D61818 Other pancytopenia: Secondary | ICD-10-CM | POA: Diagnosis not present

## 2016-07-18 DIAGNOSIS — R1310 Dysphagia, unspecified: Secondary | ICD-10-CM | POA: Diagnosis not present

## 2016-07-18 DIAGNOSIS — R1013 Epigastric pain: Secondary | ICD-10-CM | POA: Diagnosis not present

## 2016-07-18 DIAGNOSIS — F329 Major depressive disorder, single episode, unspecified: Secondary | ICD-10-CM | POA: Diagnosis not present

## 2016-07-18 DIAGNOSIS — H9190 Unspecified hearing loss, unspecified ear: Secondary | ICD-10-CM | POA: Diagnosis not present

## 2016-07-18 DIAGNOSIS — M810 Age-related osteoporosis without current pathological fracture: Secondary | ICD-10-CM | POA: Diagnosis present

## 2016-07-18 DIAGNOSIS — Z86718 Personal history of other venous thrombosis and embolism: Secondary | ICD-10-CM | POA: Diagnosis not present

## 2016-07-18 DIAGNOSIS — I442 Atrioventricular block, complete: Secondary | ICD-10-CM

## 2016-07-18 DIAGNOSIS — R109 Unspecified abdominal pain: Secondary | ICD-10-CM | POA: Diagnosis present

## 2016-07-18 DIAGNOSIS — N390 Urinary tract infection, site not specified: Secondary | ICD-10-CM | POA: Diagnosis not present

## 2016-07-18 DIAGNOSIS — D72819 Decreased white blood cell count, unspecified: Secondary | ICD-10-CM | POA: Diagnosis not present

## 2016-07-18 DIAGNOSIS — R072 Precordial pain: Secondary | ICD-10-CM

## 2016-07-18 DIAGNOSIS — I429 Cardiomyopathy, unspecified: Secondary | ICD-10-CM | POA: Diagnosis not present

## 2016-07-18 DIAGNOSIS — I2510 Atherosclerotic heart disease of native coronary artery without angina pectoris: Secondary | ICD-10-CM | POA: Diagnosis present

## 2016-07-18 DIAGNOSIS — B3781 Candidal esophagitis: Secondary | ICD-10-CM | POA: Diagnosis not present

## 2016-07-18 DIAGNOSIS — F1010 Alcohol abuse, uncomplicated: Secondary | ICD-10-CM | POA: Diagnosis present

## 2016-07-18 DIAGNOSIS — F411 Generalized anxiety disorder: Secondary | ICD-10-CM | POA: Diagnosis present

## 2016-07-18 DIAGNOSIS — K449 Diaphragmatic hernia without obstruction or gangrene: Secondary | ICD-10-CM | POA: Diagnosis not present

## 2016-07-18 DIAGNOSIS — M797 Fibromyalgia: Secondary | ICD-10-CM | POA: Diagnosis present

## 2016-07-18 DIAGNOSIS — I10 Essential (primary) hypertension: Secondary | ICD-10-CM | POA: Diagnosis present

## 2016-07-18 DIAGNOSIS — Z8674 Personal history of sudden cardiac arrest: Secondary | ICD-10-CM | POA: Diagnosis not present

## 2016-07-18 DIAGNOSIS — D696 Thrombocytopenia, unspecified: Secondary | ICD-10-CM | POA: Diagnosis not present

## 2016-07-18 DIAGNOSIS — I252 Old myocardial infarction: Secondary | ICD-10-CM | POA: Diagnosis not present

## 2016-07-18 DIAGNOSIS — K222 Esophageal obstruction: Secondary | ICD-10-CM | POA: Diagnosis not present

## 2016-07-18 DIAGNOSIS — E7800 Pure hypercholesterolemia, unspecified: Secondary | ICD-10-CM | POA: Diagnosis present

## 2016-07-18 DIAGNOSIS — R001 Bradycardia, unspecified: Secondary | ICD-10-CM | POA: Diagnosis not present

## 2016-07-18 DIAGNOSIS — R079 Chest pain, unspecified: Secondary | ICD-10-CM | POA: Diagnosis not present

## 2016-07-18 DIAGNOSIS — D7589 Other specified diseases of blood and blood-forming organs: Secondary | ICD-10-CM | POA: Diagnosis not present

## 2016-07-18 DIAGNOSIS — Z79899 Other long term (current) drug therapy: Secondary | ICD-10-CM | POA: Diagnosis not present

## 2016-07-18 DIAGNOSIS — K210 Gastro-esophageal reflux disease with esophagitis: Secondary | ICD-10-CM | POA: Diagnosis not present

## 2016-07-18 LAB — CBC
HEMATOCRIT: 35 % — AB (ref 36.0–46.0)
HEMOGLOBIN: 11.3 g/dL — AB (ref 12.0–15.0)
MCH: 34.6 pg — AB (ref 26.0–34.0)
MCHC: 32.3 g/dL (ref 30.0–36.0)
MCV: 107 fL — ABNORMAL HIGH (ref 78.0–100.0)
Platelets: 174 10*3/uL (ref 150–400)
RBC: 3.27 MIL/uL — ABNORMAL LOW (ref 3.87–5.11)
RDW: 15.2 % (ref 11.5–15.5)
WBC: 2.5 10*3/uL — ABNORMAL LOW (ref 4.0–10.5)

## 2016-07-18 LAB — BASIC METABOLIC PANEL
Anion gap: 11 (ref 5–15)
BUN: 13 mg/dL (ref 6–20)
CO2: 25 mmol/L (ref 22–32)
CREATININE: 1.03 mg/dL — AB (ref 0.44–1.00)
Calcium: 7.8 mg/dL — ABNORMAL LOW (ref 8.9–10.3)
Chloride: 100 mmol/L — ABNORMAL LOW (ref 101–111)
GLUCOSE: 82 mg/dL (ref 65–99)
Potassium: 3.8 mmol/L (ref 3.5–5.1)
Sodium: 136 mmol/L (ref 135–145)

## 2016-07-18 LAB — TROPONIN I

## 2016-07-18 LAB — HIV ANTIBODY (ROUTINE TESTING W REFLEX)
HIV SCREEN 4TH GENERATION: NONREACTIVE
HIV Screen 4th Generation wRfx: NONREACTIVE

## 2016-07-18 LAB — HEMOGLOBIN A1C
Hgb A1c MFr Bld: 4.5 % — ABNORMAL LOW (ref 4.8–5.6)
Mean Plasma Glucose: 82 mg/dL

## 2016-07-18 MED ORDER — DEXTROSE 5 % IV SOLN
1.0000 g | INTRAVENOUS | Status: DC
  Administered 2016-07-18: 1 g via INTRAVENOUS
  Filled 2016-07-18 (×2): qty 10

## 2016-07-18 MED ORDER — PANTOPRAZOLE SODIUM 40 MG PO TBEC
40.0000 mg | DELAYED_RELEASE_TABLET | Freq: Two times a day (BID) | ORAL | Status: DC
  Administered 2016-07-18 – 2016-07-19 (×2): 40 mg via ORAL
  Filled 2016-07-18 (×2): qty 1

## 2016-07-18 MED ORDER — CHLORHEXIDINE GLUCONATE CLOTH 2 % EX PADS
6.0000 | MEDICATED_PAD | Freq: Every day | CUTANEOUS | Status: DC
  Administered 2016-07-19 – 2016-07-21 (×3): 6 via TOPICAL

## 2016-07-18 MED ORDER — GI COCKTAIL ~~LOC~~
30.0000 mL | Freq: Two times a day (BID) | ORAL | Status: DC
  Administered 2016-07-18 – 2016-07-21 (×6): 30 mL via ORAL
  Filled 2016-07-18 (×6): qty 30

## 2016-07-18 MED ORDER — MUPIROCIN 2 % EX OINT
1.0000 "application " | TOPICAL_OINTMENT | Freq: Two times a day (BID) | CUTANEOUS | Status: DC
  Administered 2016-07-18 – 2016-07-21 (×7): 1 via NASAL
  Filled 2016-07-18: qty 22

## 2016-07-18 MED ORDER — GI COCKTAIL ~~LOC~~: 30.0000 mL | Freq: Four times a day (QID) | ORAL | Status: DC | PRN

## 2016-07-18 MED ORDER — ENSURE ENLIVE PO LIQD
237.0000 mL | Freq: Two times a day (BID) | ORAL | Status: DC
  Administered 2016-07-19: 237 mL via ORAL

## 2016-07-18 MED ORDER — TRAMADOL HCL 50 MG PO TABS: 50.0000 mg | ORAL_TABLET | Freq: Once | ORAL | Status: DC | PRN

## 2016-07-18 MED ORDER — MAGNESIUM SULFATE 2 GM/50ML IV SOLN
2.0000 g | Freq: Once | INTRAVENOUS | Status: AC
  Administered 2016-07-18: 2 g via INTRAVENOUS
  Filled 2016-07-18: qty 50

## 2016-07-18 NOTE — Progress Notes (Signed)
   Chart has been reviewed by myself as well as by Dr. Caryl Comes. Cardiac enzymes are negative x 3.  Per history, ? Pill esophagitis as cause of chest discomfort. We will hold off on formal cardiac consultation for now. Cardiology will be available if needed today. Call for any further assistance.   Lyda Jester, PA-C 07/18/2016

## 2016-07-18 NOTE — Progress Notes (Signed)
RN present in the patient's room and noted that the patient's heart rate slowed after she began vomiting.  Patient states that tis has happened to her previously about 1 year ago.  ZOLL pads placed on the patient and the monitor connected and tested.

## 2016-07-18 NOTE — Progress Notes (Signed)
Family Medicine Teaching Service Daily Progress Note Intern Pager: [phone]  Patient name: [name] Medical record number: [id number] Date of birth: August 03, 1960 Age: 56 y.o. Gender: female  Primary Care Provider: [name], MD Consultants: Cardiology Code Status: Full  Pt Overview and Major Events to Date:  3/10: Admitted to FMTS for ACS rule out  Assessment and Plan: [name] is a 56 y.o. female presenting with squeezing abdominal pain along with nausea and vomiting. PMH is significant for alcohol abuse, major depressive disorder, NSTEMI with cardiac arrest, GERD, recurrent aspiration, hypertension, dysphagia, hyperlipidemia  Atypical Chest pain:  Non-exertional centralized chest pain. Hx NSTEMI and cardiac arrest 12/2015. Normal nuclear stress test 01/2016. Likely GERD vs stable angina. Trops neg x 3. EKG with new TWI in V2-V4. Pt noted to have a pause on tele this morning. - Cardiology to see this am and review telemetry strip, appreciate recommendations - Continuous cardiac monitoring - Nitroglycerin 0.4 mg every 5 minutes PRN chest pain - GI cocktail - Protonix - Vitals per floor protocol - Tylenol PRN pain  Epigastric abdominal pain with nausea and vomiting, improving: History of GERD and takes Protonix daily. Lipase mildly elevated at 66. No vomiting since admission. - GI cocktail and Protonix - Zofran prn for nausea - Tylenol prn  Urinary tract infection: Pt with suprapubic pain.  - CTX (3/10-). Transition to oral abx pending urine cx results - Urine culture pending  Dysphagia: Has been seen by [facility] in the past, who thought intermittent dysphagia is 2/2 recurrent esophageal candidiasis. HIV neg. - SLP swallow eval - Full liquid diet for now, as Pt primarily having problems with solids - Needs GI outpatient f/u  Hypotension with hx of hypertension: Initial blood pressure 92/59, which improved to 115/78 with fluid. Likely in the setting of dehydration.  Takes metoprolol tartrate 75 mg twice a day and prazosin 2 mg daily.  - Continue to hold home meds  GERD: Takes 40 mg Protonix daily - Continue Protonix  History of alcohol abuse: Quit a few months ago. UDS neg. Alcohol level on admission 6. - CIWA protocol  Hearing impairment: Reported to have hearing impairment secondary to recurrent ear infections. Patient indicates by reading lips. - Hearing aid in place  Major depressive disorder/generalized anxiety disorder: Patient takes volazodone 40 mg daily - continue volazodone 40 mg daily  Hyperlipidemia: Takes atorvastatin 40 mg daily. -Continue atorvastatin  Hx of cardiac arrest and cardiac surgery: Hx of 6 minute cardiac arrest after respiratory failure after choking on a pill, was found to have NSTEMI afterwards; this was all in August 2017. S/p ASD and MV repair as well. Had nuclear stress test in 9/17 which was negative. Last saw cardiologist in Oct 2017. - See plan above. - Needs outpt cardiology f/u on discharge.  Mild Leukopenia: WBC 3.7.  - daily CBC  FEN/GI: Full liquid diet, pending SLP eval Prophylaxis: Lovenox  Disposition: Pending cardiology recommendations.   Subjective:  Pt states she is doing fine this morning. Her chest pain has resolved. She continues to have abdominal pain. No vomiting overnight, but endorses mild nausea this morning.  Objective: Temp:  [97.7 F (36.5 C)-98.4 F (36.9 C)] 98.4 F (36.9 C) (03/11 0804) Pulse Rate:  [65-84] 84 (03/11 0837) Resp:  [15-18] 15 (03/11 0804) BP: (85-133)/(51-99) 112/51 (03/11 0837) SpO2:  [95 %-100 %] 99 % (03/11 0804) Weight:  [127 lb 14.4 oz (58 kg)-130 lb 11.2 oz (59.3 kg)] 127 lb 14.4 oz (58 kg) (03/11 0447)  Physical Exam: General: Laying in bed, in NAD, appears older than stated age 74: EOMI, MMM Neck:  Supple, no LAD Cardiovascular: RRR, no MRG, 2+ palpable pulses Respiratory: normal work of breathing, lungs clear Gastrointestinal: +BS, soft,  mild epigastric tenderness MSK: No edema Derm: warm and dry Neuro: AAOx3, no focal deficits Psych: Normal mood and affect  Laboratory:  Recent Labs Lab 07/17/16 1153  WBC 3.7*  HGB 12.4  HCT 36.2  PLT 257    Recent Labs Lab 07/17/16 1153 07/18/16 0604  NA 138 136  K 3.7 3.8  CL 99* 100*  CO2 23 25  BUN 9 13  CREATININE 0.88 1.03*  CALCIUM 8.7* 7.8*  PROT 6.3*  --   BILITOT 0.8  --   ALKPHOS 77  --   ALT 36  --   AST 113*  --   GLUCOSE 87 82    Imaging/Diagnostic Tests: CXR 3/10: Neg  Sela Hua, MD 07/18/2016, 8:41 AM PGY-2, [place] Intern pager: [phone], text pages welcome

## 2016-07-18 NOTE — Consult Note (Signed)
Patient ID: [name] MRN: [id number], DOB/AGE: 1960-08-20   Admit date: 07/17/2016   Reason for Consult: De Hollingshead on Telemetry  Requesting MD: Internal Medicine    Primary Physician: [name], MD Primary Cardiologist: Dr. Marlou Porch  Pt. Profile:  56 y/o female with h/o ETOH abuse, major depression, hearing impairment, cardiac arrest in 12/2014/ NSTEMI 2/2 acute respiratory failure/PNA, echo with normal LVEF and low risk NST 01/2016, readmitted by IM for atypical CP. She has ruled out for MI. Cardiology consulted for intermittent bradycardia/ pauses on telemetry.   Problem List  Past Medical History:  Diagnosis Date  . Allergy    SEASONAL  . Anemia   . Anxiety   . Arthritis   . ASD (atrial septal defect)    a. s/p ASD repair in 1973  . CAD (coronary artery disease)    a. 12/2015: NSTEMI occurring after cardiac arrest secondary to aspiration PNA. Echo w/ EF of 55-60%, no WMA. Outpt ischemic eval needed.  . Cardiomyopathy   . Deaf   . Depression   . Dysphagia    due to esophageal infections.  . Fibromyalgia   . Gastritis   . GERD (gastroesophageal reflux disease)   . Hearing impairment   . Hypertension    [name], take htn medication to regulate heart beat.  . Migraine   . Osteoporosis   . Pulmonary embolism ([place])    occured post c-section of her daughter  . Sleep apnea   . Vitamin D deficiency     Past Surgical History:  Procedure Laterality Date  . APPENDECTOMY    . CARDIAC SURGERY  January 2007   mitral valve repair  . CARDIAC SURGERY  1973   atrial septal defect  . CARDIAC SURGERY    . COCHLEAR IMPLANT    . INNER EAR SURGERY     TUBES  . LAPAROSCOPIC CHOLECYSTECTOMY  2012  . TONSILLECTOMY AND ADENOIDECTOMY    . TUBAL LIGATION       Allergies  Allergies  Allergen Reactions  . Depakote [Divalproex Sodium] Anaphylaxis  . Diazepam Other (See Comments)    Died on operating table  . Valium Anaphylaxis  . Tizanidine Hcl Other (See Comments)     "passed out"    HPI  56 y/o female, followed by Dr. Marlou Porch, for whom we have been consulted for intermittent bradycardia with pauses on telemetry.   Per chart review, she has a h/o alcohol abuse, major depression and hearing impairment. She was hospitalized in August 2017 after presenting to the ED with complaint of nausea and vomiting with epigastric pain, and gap metabolic acidosis/lactic acidosis. She had leukocytosis of 20k and was admitted to telemetry. She went into acute respiratory failure leading to cardiac arrest after choking on a pill. CPR was initiated with ROSC in 6 minutes. She was transferred to ICU and intubated. She was found to have a right lower lobe pneumonia. Also found to have elevated troponins (0.03, 0.10, 7.89, 13.88, and 21.83) and was noted to have paroxsymal atrial flutter.  2D echo showed normal LVEF at 55-60% with normal wall motion. It was felt that her NSTEMI/ arrest was related to her acute respiratory failure. Medical therapy was initially elected given issues with ETOH abuse and compliance issues. It was recommended that she undergo outpatient ischemic testing after her other medical issues resolved. She was evaluated by nuclear stress test which was completed 01/09/16. This was a low risk study w/o ischemia. EF was 75%.   She has  a history of ASD repair and mitral valve repair  She was readmitted by IM on 07/17/16. She presented with complaint of chest/ abdominal pain. Her pain started acutely after taking a pill that got stuck in her throat. Troponins are negative x 3. EKG shows new t-wave inversions in leads III, AVF, V3, and V4 but no ST changes. She is also being treated for a UTI.   Earlier today, we were called by RN on telemetry floor regarding episodic bradycardia/ pauses on telemetry. These events seem to occur while the patient is retching. She is nauseated. K is 3.8 today. Mg was 1.4 yesterday (no supplementation was ordered). Suspect low Mg related to  chronic ETOH abuse. She is on a BB and was given her metoprolol earlier today. She received 75 mg of Lopressor ~0830.   Pt denies any further CP. She does recall a syncopal episode last May (2017) that also occurred in the setting of n/v and dry heaving.     Home Medications  Prior to Admission medications   Medication Sig Start Date End Date Taking? Authorizing Provider  aspirin 81 MG chewable tablet Chew 81 mg by mouth daily.   Yes Historical Provider, MD  atorvastatin (LIPITOR) 40 MG tablet Take 1 tablet (40 mg total) by mouth daily at 6 PM. 02/02/16  Yes [name], MD  Cholecalciferol (VITAMIN D3) 2000 units TABS Take 2,000 Units by mouth daily.    Yes Historical Provider, MD  fexofenadine (ALLEGRA ALLERGY) 180 MG tablet Take 180 mg by mouth daily.   Yes Historical Provider, MD  fluticasone (FLONASE) 50 MCG/ACT nasal spray Place 2 sprays into both nostrils daily.   Yes Historical Provider, MD  folic acid (FOLVITE) 1 MG tablet Take 1 mg by mouth daily.   Yes Historical Provider, MD  Metoprolol Tartrate 75 MG TABS Take 75 mg by mouth 2 (two) times daily. 02/02/16  Yes [name], MD  nitroGLYCERIN (NITROSTAT) 0.4 MG SL tablet Place 1 tablet (0.4 mg total) under the tongue every 5 (five) minutes as needed for chest pain. 05/11/16  Yes [name], MD  Omega-3 Fatty Acids (FISH OIL) 1000 MG CAPS Take 1,000 mg by mouth 2 (two) times daily.    Yes Historical Provider, MD  ondansetron (ZOFRAN ODT) 4 MG disintegrating tablet Take 1 tablet (4 mg total) by mouth every 8 (eight) hours as needed for nausea. 11/08/15  Yes [name], MD  pantoprazole (PROTONIX) 40 MG tablet TAKE 1 TABLET (40 MG TOTAL) BY MOUTH DAILY. Patient taking differently: Take 40 mg by mouth daily.  36/4/68  Yes Delora Fuel, MD  prazosin (MINIPRESS) 2 MG capsule Take 2 mg by mouth at bedtime.  02/17/16  Yes Historical Provider, MD  Ramos into the lungs at bedtime. CPAP   Yes Historical Provider, MD  Vilazodone  HCl (VIIBRYD) 40 MG TABS Take 40 mg by mouth daily.   Yes Historical Provider, MD  vitamin B-12 (CYANOCOBALAMIN) 500 MCG tablet Take 500 mcg by mouth daily.   Yes Historical Provider, MD  benzonatate (TESSALON) 200 MG capsule Take 1 capsule (200 mg total) by mouth 3 (three) times daily as needed for cough. Patient not taking: Reported on 07/17/2016 04/13/16   [name], MD  ondansetron (ZOFRAN) 4 MG tablet Take 1 tablet (4 mg total) by mouth every 8 (eight) hours as needed for nausea or vomiting. Patient not taking: Reported on 07/17/2016 07/10/10   Delora Fuel, MD    Family History  Family History  Problem Relation Age of Onset  . Breast cancer Mother     bilateral; ages 21 and 63; TAH/BSO ~50  . Depression Sister   . Heart disease Father   . Hypertension Father   . Colon cancer Neg Hx   . Esophageal cancer Neg Hx   . Pancreatic cancer Neg Hx   . Rectal cancer Neg Hx   . Stomach cancer Neg Hx     Social History  Social History   Social History  . Marital status: Single    Spouse name: N/A  . Number of children: N/A  . Years of education: N/A   Occupational History  . Not on file.   Social History Main Topics  . Smoking status: Never Smoker  . Smokeless tobacco: Never Used  . 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  . Drug use: No  . Sexual activity: Yes    Birth control/ protection: Surgical     Comment: 1st intercourse- 19, partners- 6   Other Topics Concern  . Not on file   Social History Narrative   Lives with 15 year old daughter.         Review of Systems General:  No chills, fever, night sweats or weight changes.  Cardiovascular:  No chest pain, dyspnea on exertion, edema, orthopnea, palpitations, paroxysmal nocturnal dyspnea. Dermatological: No rash, lesions/masses Respiratory: No cough, dyspnea Urologic: No hematuria, dysuria Abdominal:   No nausea, vomiting, diarrhea, bright red blood per rectum, melena,  or hematemesis Neurologic:  No visual changes, wkns, changes in mental status. All other systems reviewed and are otherwise negative except as noted above.  Physical Exam  Blood pressure (!) 112/51, pulse 83, temperature 98.2 F (36.8 C), temperature source Oral, resp. rate 15, height 5\' 3"  (1.6 m), weight 127 lb 14.4 oz (58 kg), SpO2 98 %.  General: Pleasant, NAD, hard of hearing Psych: Normal affect. Neuro: Alert and oriented X 3. Moves all extremities spontaneously. HEENT: Normal  Neck: Supple without bruits or JVD. Lungs:  Resp regular and unlabored, CTA. Heart: RRR no s3, s4, or murmurs. Abdomen: Soft, non-tender, non-distended, BS + x 4.  Extremities: No clubbing, cyanosis or edema. DP/PT/Radials 2+ and equal bilaterally.  Labs  Troponin [facility] Test)  Recent Labs  07/17/16 1215  TROPIPOC 0.00    Recent Labs  07/17/16 1823 07/17/16 2117 07/18/16 0002  TROPONINI <0.03 <0.03 <0.03   Lab Results  Component Value Date   WBC 2.5 (L) 07/18/2016   HGB 11.3 (L) 07/18/2016   HCT 35.0 (L) 07/18/2016   MCV 107.0 (H) 07/18/2016   PLT 174 07/18/2016    Recent Labs Lab 07/17/16 1153 07/18/16 0604  NA 138 136  K 3.7 3.8  CL 99* 100*  CO2 23 25  BUN 9 13  CREATININE 0.88 1.03*  CALCIUM 8.7* 7.8*  PROT 6.3*  --   BILITOT 0.8  --   ALKPHOS 77  --   ALT 36  --   AST 113*  --   GLUCOSE 87 82   Lab Results  Component Value Date   CHOL 215 (H) 02/26/2016   HDL 70 02/26/2016   LDLCALC 98 02/26/2016   TRIG 237 (H) 02/26/2016   Lab Results  Component Value Date   DDIMER 0.32 05/20/2014     Radiology/Studies  Dg Chest 2 View  Result Date: 07/17/2016 CLINICAL DATA:  chest and abdominal pain and racing  heart that began when she woke this morning. She reports weakness, dizziness, dry heaves. She denies SOB,cough. She tried to take her daily meds with no relief. She is concerned that a pill may be stuck in her throat because she has had this happen in the  past and she had to have endoscopy to remove the pill. Hx of atrial septal defect, coronary artery disease, cardiomyopathy, hypertension and pulmonary embolism. Hx of mitral valve repair in 2007 and atrial septal defect in 1973. EXAM: CHEST - 2 VIEW COMPARISON:  03/15/2016 FINDINGS: Lungs are clear. Heart size and mediastinal contours are within normal limits. No effusion. Previous median sternotomy and mitral valve surgery. Compression deformity of the visualized upper lumbar spine, stable. Surgical clips in the upper abdomen. IMPRESSION: 1. No acute disease. Electronically Signed   By: Lucrezia Europe M.D.   On: 07/17/2016 13:13    ECG  EKG shows new t-wave inversions in leads III, AVF, V3, and V4 but no ST changes - personally reviewed  Telemetry - NSR currently with intermittent bradycardia and long pauses/ intermitted CHB  Echocardiogram 8/11/8  Study Conclusions  - Left ventricle: The cavity size was normal. Wall thickness was   normal. Systolic function was normal. The estimated ejection   fraction was in the range of 55% to 60%. Wall motion was normal;   there were no regional wall motion abnormalities. The study is   not technically sufficient to allow evaluation of LV diastolic   function. - Mitral valve: Prior procedures included surgical repair. There   was mild regurgitation. Valve area by pressure half-time: 2.35   cm^2. - Left atrium: The atrium was moderately dilated. - Tricuspid valve: There was moderate regurgitation. - Pulmonary arteries: Systolic pressure was moderately increased. - Pericardium, extracardiac: A small pericardial effusion was   identified.  Impressions:  - Normal LV systolic function; s/p MV repair with mild MR; moderate   LAE; moderate TR with moderately elevated pulmonary pressure.    ASSESSMENT AND PLAN  1. Intermittent Bradycardia/ CHB: pt noted to brady down with prolonged pauses while actively retching from nausea. She is currently NSR with  rates in the 70s. She is symptomatic when her HR drops with near syncope and reports similar episode of syncope in May 2017, also in the setting of n/v + retching. Her CP has resolved and was atypical. It occurred acutely, shortly after ingesting a pill that got stuck in her throat. Although EKG shows new TWIs in leads III, AVF, V3, and V4, her cardiac enzymes are negative x 3 and 2D echo shows normal LVEF with normal wall motion. In addition, she had a low risk NST 6 months ago, 01/2016.  This appears to be vasovagal related. In addition, she is on a BB, Lopressor 75 mg that was given 4 hrs ago. However we will have Dr. Caryl Comes assesses pt and review telemetry for further recs. She was given dose of IV Mg sulfate, 2 gm. Mg was 1.4, likely due to ETOH abuse. K is WNL. TSH is high at 8. Recommend f/u free T3/T4. Continue to monitor on telemetry.  Pacer pads are in place.    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 She reports prior GI evaluation with [facility] although she  has not been seen since 2015.  The bradycardia took some time to sort out as PP intervals did not change significantly in association with the complete heart block. However, there is evidence of PR prolongation prior to heart block on some of the strips suggesting extrinsic affects i.e. hyper vagotonia. The patient also recounts that she had syncope in association with her vomiting.  Hence, the primary issue here is related to her GI tract needs to be addressed. I have tried to call the family practice teaching intern; I have not heard back. Perhaps not dissimilarly from deglutition syncope, in the event that she has a non-remediable  esophageal problem she may need backup bradycardia pacing.  I will see again tomorrow.

## 2016-07-18 NOTE — Progress Notes (Signed)
Notified by CCMD that patient was ringing off "asystole" - NT to bedside before RN and per NT patient was "dry heaving" leaning over side of bed. Patient stated "I think I passed out." Per patient "I was laying down trying to go to sleep but started not feeling well so I raise the bed up and then all I remember is that my vision was blurry when I came to and I couldn't remember where I was but then my vision cleared up and the NT was bringing me the trash can." Pt appeared a little more pale than earlier this morning however BP was actually higher (120s) than this am. Pt was somewhat shaky but stated "it made me really nervous and scared me because I don't know what happened to me." Tele strips reviewed and a significant pause was noted which would correlate with the timing of the above events. Lavern Henri, cardiology PA paged and made aware of events. Primary team also made aware. Cardiology currently at bedside assessing patient. Pacer pads in place and zofran given for nausea/vomiting. Bedside report passed off to [facility] who is now caring for patient and currently at the bedside.

## 2016-07-18 NOTE — Progress Notes (Signed)
Received a call from RN that patient is having severe abdominal pain. I evaluated the patient. She states she is having severe, burning epigastric abdominal pain. The pain has been the same all throughout the day. She endorses associated vomiting and belching.  Discussed with pharmacy. -Will add scheduled GI cocktails twice per day, plus qid prn -Will increase Protonix from 40mg  qd to bid  Hyman Bible, MD

## 2016-07-18 NOTE — Evaluation (Signed)
Clinical/Bedside Swallow Evaluation Patient Details  Name: [name] MRN: [id number] Date of Birth: 1960-05-28  Today's Date: 07/18/2016 Time: SLP Start Time (ACUTE ONLY): 1153 SLP Stop Time (ACUTE ONLY): 1205 SLP Time Calculation (min) (ACUTE ONLY): 12 min  Past Medical History:  Past Medical History:  Diagnosis Date  . Allergy    SEASONAL  . Anemia   . Anxiety   . Arthritis   . ASD (atrial septal defect)    a. s/p ASD repair in 1973  . CAD (coronary artery disease)    a. 12/2015: NSTEMI occurring after cardiac arrest secondary to aspiration PNA. Echo w/ EF of 55-60%, no WMA. Outpt ischemic eval needed.  . Cardiomyopathy   . Deaf   . Depression   . Dysphagia    due to esophageal infections.  . Fibromyalgia   . Gastritis   . GERD (gastroesophageal reflux disease)   . Hearing impairment   . Hypertension    [name], take htn medication to regulate heart beat.  . Migraine   . Osteoporosis   . Pulmonary embolism ([place])    occured post c-section of her daughter  . Sleep apnea   . Vitamin D deficiency    Past Surgical History:  Past Surgical History:  Procedure Laterality Date  . APPENDECTOMY    . CARDIAC SURGERY  January 2007   mitral valve repair  . CARDIAC SURGERY  1973   atrial septal defect  . CARDIAC SURGERY    . COCHLEAR IMPLANT    . INNER EAR SURGERY     TUBES  . LAPAROSCOPIC CHOLECYSTECTOMY  2012  . TONSILLECTOMY AND ADENOIDECTOMY    . TUBAL LIGATION     HPI:  [name] a 56 y.o.femalepresenting with squeezing abdominal pain along with nausea and vomiting. PMH is significant for alcohol abuse, major depressive disorder, NSTEMI with cardiac arrest, GERD, recurrent aspiration, hypertension, dysphagia, hyperlipidemia, paresis/paralysis of esophagus. (Esophagram 03/11/14: "Diffuse fixed esophageal narrowing noted throughout the mid and distal esophagus. There is associated proximal thoracic esophagus diverticulum. These findings suggest a long-standing  process resulting in diffuse esophageal scarring. Chronic reflux esophagitis and scleroderma could present in this fashion. A process such as Barrett's esophagus or esophageal malignancy cannot be excluded. The esophageal narrowing resulted in significant delay in passage of a standardized barium tablet.") Seen during prior admission (12/2015) by SLP who found primary and chronic esophageal dysphagia, no acute deficits with oropharyngeal function and no observed s/s of aspiration. Mechanical soft (dys 3 diet with thin liquids) was recommended with single follow-up for education and discussion of compensatory strategies to manage esophageal symptoms. No further follow-up was recommended. CXR 07/17/16 showed no acute disease. Referred for swallowing evaluation.    Assessment / Plan / Recommendation Clinical Impression  Patient has a history of primary, chronic esophageal dysphagia. Oropharyngeal swallow appears grossly within functional limits with appearance of adequate airway protection at bedside. No overt signs of aspiration noted despite challenging with multiple consecutive boluses of thin liquid via cup and straw. Noted frequent, persistent eructation. Mastication, oral manipulation, bolus control appear adequate for solids, swallow appears timely with all consistencies trialed. Limited solid trials at patient request due to nausea. Patient does have missing dentition which impair her ability to bite, masticate hard solids and prefers softer textures.  She states she has been unable to have her partial repaired. Recommend progressing diet to dys 3 (soft solids) with thin liquids as patient's nausea subsides. Patient is well-versed in compensations and strategies to manage esophageal  dysphagia. She independently states these strategies including thorough mastication, slow rate, alternating liquids and solids, upright posture. No further follow-up with SLP recommended at this time. SLP will s/o.  SLP Visit  Diagnosis: Dysphagia, unspecified (R13.10)    Aspiration Risk  Mild aspiration risk    Diet Recommendation Dysphagia 3 (Mech soft);Thin liquid   Liquid Administration via: Cup;Straw Medication Administration: Whole meds with liquid Supervision: Patient able to self feed Compensations: Slow rate;Small sips/bites;Follow solids with liquid Postural Changes: Remain upright for at least 30 minutes after po intake;Seated upright at 90 degrees    Other  Recommendations Oral Care Recommendations: Oral care BID   Follow up Recommendations None      Frequency and Duration            Prognosis        Swallow Study   General Date of Onset: 07/17/16 HPI: [name] a 56 y.o.femalepresenting with squeezing abdominal pain along with nausea and vomiting. PMH is significant for alcohol abuse, major depressive disorder, NSTEMI with cardiac arrest, GERD, recurrent aspiration, hypertension, dysphagia, hyperlipidemia, paresis/paralysis of esophagus. (Esophagram 03/11/14: "Diffuse fixed esophageal narrowing noted throughout the mid and distal esophagus. There is associated proximal thoracic esophagus diverticulum. These findings suggest a long-standing process resulting in diffuse esophageal scarring. Chronic reflux esophagitis and scleroderma could present in this fashion. A process such as Barrett's esophagus or esophageal malignancy cannot be excluded. The esophageal narrowing resulted in significant delay in passage of a standardized barium tablet.") Seen during prior admission (12/2015) by SLP who found primary and chronic esophageal dysphagia, no acute deficits with oropharyngeal function and no observed s/s of aspiration. Mechanical soft (dys 3 diet with thin liquids) was recommended with single follow-up for education and discussion of compensatory strategies to manage esophageal symptoms. No further follow-up was recommended. CXR 07/17/16 showed no acute disease. Referred for swallowing  evaluation.  Type of Study: Bedside Swallow Evaluation Previous Swallow Assessment: see HPI Diet Prior to this Study: Thin liquids Temperature Spikes Noted: No Respiratory Status: Room air History of Recent Intubation: No Behavior/Cognition: Alert;Cooperative Oral Cavity Assessment: Within Functional Limits Oral Care Completed by SLP: No Oral Cavity - Dentition: Missing dentition;Poor condition Vision: Functional for self-feeding Self-Feeding Abilities: Able to feed self Patient Positioning: Upright in bed Baseline Vocal Quality: Normal Volitional Cough: Strong Volitional Swallow: Able to elicit    Oral/Motor/Sensory Function Overall Oral Motor/Sensory Function: Within functional limits   Ice Chips Ice chips: Within functional limits Presentation: Spoon   Thin Liquid Thin Liquid: Within functional limits Presentation: Straw;Cup;Self Fed    Nectar Thick Nectar Thick Liquid: Not tested   Honey Thick Honey Thick Liquid: Not tested   Puree Puree: Within functional limits Presentation: Spoon;Self Fed   Solid   GO   Solid: Within functional limits Presentation: Self Fed    Functional Assessment Tool Used: skilled clinical judgment Functional Limitations: Swallowing Swallow Current Status ([id number]): At least 1 percent but less than 20 percent impaired, limited or restricted Swallow Goal Status [phone]): At least 1 percent but less than 20 percent impaired, limited or restricted Swallow Discharge Status [phone]): At least 1 percent but less than 20 percent impaired, limited or restricted   Aliene Altes 07/18/2016,12:19 PM  Deneise Lever, [place] CF-SLP Speech-Language Pathologist [phone]

## 2016-07-18 NOTE — Evaluation (Signed)
Physical Therapy Evaluation Patient Details Name: [name] MRN: [id number] DOB: 01/18/1961 Today's Date: 07/18/2016   History of Present Illness  y.o. female presenting with squeezing abdominal pain along with nausea and vomiting. PMH is significant for alcohol abuse, major depressive disorder, NSTEMI with cardiac arrest, GERD, recurrent aspiration, hypertension, dysphagia, hyperlipidemia  Clinical Impression  Patient seen for mobility assessment, ambulating well without need for physical assist. VSS with activity. OF NOTE: modestly labile BP prior to activity, consistent with BP trends per chart. Patient asymptomatic. Modest DOE noted but saturations stable >93% throughout, educated patient on pursed lip breathing and energy conservation. No further acute PT needs, will sign off.    Follow Up Recommendations No PT follow up    Equipment Recommendations  None recommended by PT    Recommendations for Other Services       Precautions / Restrictions Precautions Precautions: Fall      Mobility  Bed Mobility Overal bed mobility: Independent                Transfers Overall transfer level: Independent Equipment used: None             General transfer comment: no physical assist required  Ambulation/Gait Ambulation/Gait assistance: Independent Ambulation Distance (Feet): 210 Feet Assistive device: None Gait Pattern/deviations: Step-through pattern;Decreased stride length Gait velocity: decreased   General Gait Details: patient with some minimally noted DOE, saturations stable  Stairs            Wheelchair Mobility    Modified Rankin (Stroke Patients Only)       Balance Overall balance assessment: No apparent balance deficits (not formally assessed) (performed single leg stance while donning shoes without diff)                                           Pertinent Vitals/Pain Pain Assessment: No/denies pain    Home Living  Family/patient expects to be discharged to:: Private residence Living Arrangements: Children   Type of Home: Apartment Home Access: Level entry     Home Layout: One level Home Equipment: None Additional Comments: dauther is visiting relatives in [place] for the summer     Prior Function Level of Independence: Independent         Comments: independent but limited overall activity tolerance at baseline     Hand Dominance   Dominant Hand: Right    Extremity/Trunk Assessment   Upper Extremity Assessment Upper Extremity Assessment: Overall WFL for tasks assessed    Lower Extremity Assessment Lower Extremity Assessment: Overall WFL for tasks assessed (general deconditioning noted, pt states limited at baseline)       Communication   Communication: Deaf (uses hearing aide)  Cognition Arousal/Alertness: Awake/alert Behavior During Therapy: WFL for tasks assessed/performed Overall Cognitive Status: Within Functional Limits for tasks assessed                      General Comments      Exercises     Assessment/Plan    PT Assessment Patent does not need any further PT services  PT Problem List         PT Treatment Interventions      PT Goals (Current goals can be found in the Care Plan section)  Acute Rehab PT Goals Patient Stated Goal: to go home PT Goal Formulation: All assessment and education complete, DC  therapy    Frequency     Barriers to discharge        Co-evaluation               End of Session Equipment Utilized During Treatment: Gait belt Activity Tolerance: Patient tolerated treatment well Patient left: in bed;with call bell/phone within reach Nurse Communication: Mobility status PT Visit Diagnosis: Unsteadiness on feet (R26.81)    Functional Assessment Tool Used: Clinical judgement Functional Limitation: Mobility: Walking and moving around Mobility: Walking and Moving Around Current Status (E1859): At least 1 percent but less  than 20 percent impaired, limited or restricted Mobility: Walking and Moving Around Goal Status [phone] At least 1 percent but less than 20 percent impaired, limited or restricted Mobility: Walking and Moving Around Discharge Status [phone]): At least 1 percent but less than 20 percent impaired, limited or restricted    Time: 0930-0947 PT Time Calculation (min) (ACUTE ONLY): 17 min   Charges:   PT Evaluation $PT Eval Low Complexity: 1 Procedure     PT G Codes:   PT G-Codes **NOT FOR INPATIENT CLASS** Functional Assessment Tool Used: Clinical judgement Functional Limitation: Mobility: Walking and moving around Mobility: Walking and Moving Around Current Status (E6950): At least 1 percent but less than 20 percent impaired, limited or restricted Mobility: Walking and Moving Around Goal Status [phone]): At least 1 percent but less than 20 percent impaired, limited or restricted Mobility: Walking and Moving Around Discharge Status [phone]): At least 1 percent but less than 20 percent impaired, limited or restricted     Duncan Dull 07/18/2016, 9:55 AM Alben Deeds, PT DPT  [phone]

## 2016-07-19 DIAGNOSIS — R55 Syncope and collapse: Secondary | ICD-10-CM

## 2016-07-19 DIAGNOSIS — D72819 Decreased white blood cell count, unspecified: Secondary | ICD-10-CM

## 2016-07-19 DIAGNOSIS — R1013 Epigastric pain: Secondary | ICD-10-CM

## 2016-07-19 DIAGNOSIS — R079 Chest pain, unspecified: Secondary | ICD-10-CM

## 2016-07-19 DIAGNOSIS — R001 Bradycardia, unspecified: Secondary | ICD-10-CM

## 2016-07-19 DIAGNOSIS — R1310 Dysphagia, unspecified: Secondary | ICD-10-CM

## 2016-07-19 DIAGNOSIS — D7589 Other specified diseases of blood and blood-forming organs: Secondary | ICD-10-CM

## 2016-07-19 HISTORY — DX: Syncope and collapse: R55

## 2016-07-19 LAB — CBC
HEMATOCRIT: 31.8 % — AB (ref 36.0–46.0)
HEMOGLOBIN: 10.6 g/dL — AB (ref 12.0–15.0)
MCH: 34.5 pg — AB (ref 26.0–34.0)
MCHC: 33.3 g/dL (ref 30.0–36.0)
MCV: 103.6 fL — AB (ref 78.0–100.0)
Platelets: 142 10*3/uL — ABNORMAL LOW (ref 150–400)
RBC: 3.07 MIL/uL — AB (ref 3.87–5.11)
RDW: 14.9 % (ref 11.5–15.5)
WBC: 2 10*3/uL — ABNORMAL LOW (ref 4.0–10.5)

## 2016-07-19 LAB — FOLATE: FOLATE: 28.3 ng/mL (ref >5.9)

## 2016-07-19 LAB — BASIC METABOLIC PANEL
Anion gap: 12 (ref 5–15)
BUN: 10 mg/dL (ref 6–20)
CHLORIDE: 100 mmol/L — AB (ref 101–111)
CO2: 24 mmol/L (ref 22–32)
CREATININE: 0.94 mg/dL (ref 0.44–1.00)
Calcium: 7.7 mg/dL — ABNORMAL LOW (ref 8.9–10.3)
GFR calc Af Amer: >60 mL/min (ref >60)
GFR calc non Af Amer: >60 mL/min (ref >60)
Glucose, Bld: 94 mg/dL (ref 65–99)
POTASSIUM: 3.3 mmol/L — AB (ref 3.5–5.1)
Sodium: 136 mmol/L (ref 135–145)

## 2016-07-19 LAB — RETICULOCYTES
RBC.: 3.1 MIL/uL — ABNORMAL LOW (ref 3.87–5.11)
RETIC COUNT ABSOLUTE: 65.1 10*3/uL (ref 19.0–186.0)
Retic Ct Pct: 2.1 % (ref 0.4–3.1)

## 2016-07-19 LAB — URINE CULTURE: Culture: >=100000 — AB

## 2016-07-19 LAB — FERRITIN: FERRITIN: 221 ng/mL (ref 11–307)

## 2016-07-19 LAB — SAVE SMEAR

## 2016-07-19 LAB — VITAMIN B12: Vitamin B-12: 447 pg/mL (ref 180–914)

## 2016-07-19 LAB — T4, FREE: Free T4: 0.77 ng/dL (ref 0.61–1.12)

## 2016-07-19 LAB — TSH: TSH: 2.992 u[IU]/mL (ref 0.350–4.500)

## 2016-07-19 LAB — IRON AND TIBC
IRON: 37 ug/dL (ref 28–170)
Saturation Ratios: 15 % (ref 10.4–31.8)
TIBC: 239 ug/dL — ABNORMAL LOW (ref 250–450)
UIBC: 202 ug/dL

## 2016-07-19 MED ORDER — PANTOPRAZOLE SODIUM 40 MG PO TBEC
40.0000 mg | DELAYED_RELEASE_TABLET | Freq: Every day | ORAL | Status: DC
  Administered 2016-07-20 – 2016-07-21 (×2): 40 mg via ORAL
  Filled 2016-07-19 (×2): qty 1

## 2016-07-19 MED ORDER — POTASSIUM CHLORIDE CRYS ER 20 MEQ PO TBCR
40.0000 meq | EXTENDED_RELEASE_TABLET | Freq: Two times a day (BID) | ORAL | Status: AC
  Administered 2016-07-19 (×2): 40 meq via ORAL
  Filled 2016-07-19 (×2): qty 2

## 2016-07-19 NOTE — Consult Note (Signed)
[facility] Consult: 2:11 PM 07/19/2016  LOS: 1 day    Referring Provider: Dr Mingo Amber  Primary Care Physician:  [name], MD Primary Gastroenterologist:  Dr. Ardis Hughs     Reason for Consultation:  Non cardiac chest pain, n/v.     HPI: [name] is a 56 y.o. female.  PMH OSA.  Anemia, IDA and macrocytic.  Thrombocytopenia. S/p OHS with MV repair 2007, ASD repair 1973.  Non-STEMI in setting cardiac arrest and aspiration PNA 12/2015.  Hearing impairment.  Depression.  ~ 2004 PE post c-section.  07/2010 Lap chole.    03/2016 CT ab/pelvis with contrast.  Hepatic steatosis with suspicion of mild cirrhosis. 03/2014 EGD for intermittent dysphagia and eophageal narrowing on esophagram.  Cancicial esophagitis, mainly proximal esophagus.  Normal GE Jx.  4 cm HH with resulting foreshortened and tortuous esophagus. Plan: 10 days Diflucan.   03/2014 barium esophagram: Diffuse fixed esophageal narrowing noted throughout the mid and distal esophagus. There is associated proximal thoracic esophagus diverticulum. These findings suggest a long-standing process resulting in diffuse esophageal scarring. Chronic reflux esophagitis and scleroderma could present in this fashion. A process such as Barrett's esophagus or esophageal malignancy cannot be excluded. The esophageal narrowing resulted in significant delay in passage of a standardized barium tablet. Further evaluation with upper endoscopy should be considered.  Small sliding hiatal hernia. No gastroesophageal reflux could be demonstrated. 10/2013 EGD for anemia, FOBT +, vomiting, abd pain.  HH, candida esophagitis, gastritis. Path: reactive gastropathy, no H Pylori.  10/2013 Colonoscopy.  3 mm sessile, transverse colon polyp, removed but not retrieved. Few small left colon tics.  Plan:  repeat colonoscopy in 2019.  10/2007 Colonoscopy.  For FOBT+.  Normal study.   Long hx of intermittent choking/gagging at UE Jx region with PO.  Also intermittent, less frequent dysphagia at LES that manifests with silent sxs but will lead to accumulation of sputum and needing to spit.  Says he has had food disimpaction in past but do not find this in records.   Pt admitted with c/o pain, vomiting.  She had event of choking on a pill she was taking for "racing heart".  Soon after unable to get water to pass.  Then had sternal pain.  Epigastric pain and ? Syncope after arrival to ED.  Eventually the acute dysphagis resolved.  Bradycardia and prolonged pauses attributed to vasovagal response to choking on pill that got stuck in her throat. Pt ruled out for ACS.  Per Dr Caryl Comes primary issue is "her GI tract... "Perhaps not dissimilarly from deglutition syncope, in the event that she has a non-remediable esophageal problem she may need backup bradycardia pacing.   Blood ETOH is elevated at 6.  She says no ETOH for several months.        Past Medical History:  Diagnosis Date  . Allergy    SEASONAL  . Anemia   . Anxiety   . Arthritis   . ASD (atrial septal defect)    a. s/p ASD repair in 1973  . CAD (coronary artery disease)  a. 12/2015: NSTEMI occurring after cardiac arrest secondary to aspiration PNA. Echo w/ EF of 55-60%, no WMA. Outpt ischemic eval needed.  . Cardiomyopathy   . Deaf   . Depression   . Dysphagia    due to esophageal infections.  . Fibromyalgia   . Gastritis   . GERD (gastroesophageal reflux disease)   . Hearing impairment   . Hypertension    [name], take htn medication to regulate heart beat.  . Migraine   . Osteoporosis   . Pulmonary embolism ([place])    occured post c-section of her daughter  . Sleep apnea   . Vitamin D deficiency     Past Surgical History:  Procedure Laterality Date  . APPENDECTOMY    . CARDIAC SURGERY  January 2007   mitral valve repair  .  CARDIAC SURGERY  1973   atrial septal defect  . CARDIAC SURGERY    . COCHLEAR IMPLANT    . INNER EAR SURGERY     TUBES  . LAPAROSCOPIC CHOLECYSTECTOMY  2012  . TONSILLECTOMY AND ADENOIDECTOMY    . TUBAL LIGATION      Prior to Admission medications   Medication Sig Start Date End Date Taking? Authorizing Provider  aspirin 81 MG chewable tablet Chew 81 mg by mouth daily.   Yes Historical Provider, [place]  atorvastatin (LIPITOR) 40 MG tablet Take 1 tablet (40 mg total) by mouth daily at 6 PM. 02/02/16  Yes [name], MD  Cholecalciferol (VITAMIN D3) 2000 units TABS Take 2,000 Units by mouth daily.    Yes Historical Provider, [place]  fexofenadine (ALLEGRA ALLERGY) 180 MG tablet Take 180 mg by mouth daily.   Yes Historical Provider, [place]  fluticasone (FLONASE) 50 MCG/ACT nasal spray Place 2 sprays into both nostrils daily.   Yes Historical Provider, MD  folic acid (FOLVITE) 1 MG tablet Take 1 mg by mouth daily.   Yes Historical Provider, [place]  Metoprolol Tartrate 75 MG TABS Take 75 mg by mouth 2 (two) times daily. 02/02/16  Yes [name], MD  nitroGLYCERIN (NITROSTAT) 0.4 MG SL tablet Place 1 tablet (0.4 mg total) under the tongue every 5 (five) minutes as needed for chest pain. 05/11/16  Yes [name], MD  Omega-3 Fatty Acids (FISH OIL) 1000 MG CAPS Take 1,000 mg by mouth 2 (two) times daily.    Yes Historical Provider, [place]  ondansetron (ZOFRAN ODT) 4 MG disintegrating tablet Take 1 tablet (4 mg total) by mouth every 8 (eight) hours as needed for nausea. 11/08/15  Yes [name], MD  pantoprazole (PROTONIX) 40 MG tablet TAKE 1 TABLET (40 MG TOTAL) BY MOUTH DAILY. Patient taking differently: Take 40 mg by mouth daily.  51/0/25  Yes Delora Fuel, MD  prazosin (MINIPRESS) 2 MG capsule Take 2 mg by mouth at bedtime.  02/17/16  Yes Historical Provider, [place]  [place] into the lungs at bedtime. CPAP   Yes Historical Provider, [place]  Vilazodone HCl (VIIBRYD) 40 MG TABS Take 40 mg by mouth daily.    Yes Historical Provider, [place]  vitamin B-12 (CYANOCOBALAMIN) 500 MCG tablet Take 500 mcg by mouth daily.   Yes Historical Provider, [place]  benzonatate (TESSALON) 200 MG capsule Take 1 capsule (200 mg total) by mouth 3 (three) times daily as needed for cough. Patient not taking: Reported on 07/17/2016 04/13/16   [name], MD  ondansetron (ZOFRAN) 4 MG tablet Take 1 tablet (4 mg total) by mouth every 8 (eight) hours as needed  for nausea or vomiting. Patient not taking: Reported on 07/17/2016 16/1/09   Delora Fuel, MD    Scheduled Meds: . aspirin  81 mg Oral Daily  . atorvastatin  40 mg Oral q1800  . Chlorhexidine Gluconate Cloth  6 each Topical Q0600  . enoxaparin (LOVENOX) injection  40 mg Subcutaneous Q24H  . feeding supplement (ENSURE ENLIVE)  237 mL Oral BID BM  . folic acid  1 mg Oral Daily  . gi cocktail  30 mL Oral BID  . metoprolol tartrate  75 mg Oral BID  . mupirocin ointment  1 application Nasal BID  . pantoprazole  40 mg Oral BID  . potassium chloride  40 mEq Oral BID  . sodium chloride  1,000 mL Intravenous Once  . thiamine  100 mg Oral Daily  . Vilazodone HCl  40 mg Oral Daily   Infusions:  PRN Meds: acetaminophen, gi cocktail, nitroGLYCERIN, ondansetron (ZOFRAN) IV   Allergies as of 07/17/2016 - Review Complete 07/17/2016  Allergen Reaction Noted  . Depakote [divalproex sodium] Anaphylaxis 07/01/2012  . Diazepam Other (See Comments) 09/03/2015  . Valium Anaphylaxis 09/23/2010  . Tizanidine hcl Other (See Comments) 12/06/2015    Family History  Problem Relation Age of Onset  . Breast cancer Mother     bilateral; ages 66 and 62; TAH/BSO ~50  . Depression Sister   . Heart disease Father   . Hypertension Father   . Colon cancer Neg Hx   . Esophageal cancer Neg Hx   . Pancreatic cancer Neg Hx   . Rectal cancer Neg Hx   . Stomach cancer Neg Hx     Social History   Social History  . Marital status: Single    Spouse name: N/A  . Number of children: N/A  .  Years of education: N/A   Occupational History  . Not on file.   Social History Main Topics  . Smoking status: Never Smoker  . Smokeless tobacco: Never Used  . 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  . Drug use: No  . Sexual activity: Yes    Birth control/ protection: Surgical     Comment: 1st intercourse- 19, partners- 6   Other Topics Concern  . Not on file   Social History Narrative   Lives with 75 year old daughter.        REVIEW OF SYSTEMS: Constitutional:  No weakness.  Some 20 plus # weight loss after MI in 12/2015, but mood and appetite better with new anti-depressant ENT:  No nose bleeds.  Waiting on funding for new dentures, she lost her partial in summer 2017. Pulm:  SOB resolved.  Variable compliance with CPAP at home.  CV: per HPI.  No edema GU:  No hematuria, no frequency GI:  Per HPI.  No hematemesis or CGE.  Regular, brown stools.  Heme:  Bruises easily   Transfusions:  None per hr recall Neuro:  No headaches, no peripheral tingling or numbness Derm:  No itching, no rash or sores.  Endocrine:  No sweats or chills.  No polyuria or dysuria Immunization:  Not queried Travel:  None beyond local counties in last few months.    PHYSICAL EXAM: Vital signs in last 24 hours: Vitals:   07/19/16 0551 07/19/16 0917  BP: (!) 114/56 117/76  Pulse: 86 88  Resp:    Temp: 97.9 F (36.6 C)    Wt Readings from Last 3 Encounters:  07/19/16 58.9 kg (129 lb 12.8 oz)  05/11/16 59.1 kg (130 lb 3.2 oz)  04/13/16 56.2 kg (123 lb 12.8 oz)    General: pleasant, cooperative, comfortable.  Not ill looking Head:  No asymmetry or swelling, no trauma  Eyes:  No icterus or pallor Ears:  HOH.  Hearing aid on left  Nose:  No congestion or discharge Mouth:  Moist, clear, poor dentition, tongue midline Neck:  No mass or JVD.  No TMG Lungs:  Clear bil.  No dyspnea or cough Heart: RRR.  No mrg.  S1, s2 present.  External pacer in  place Abdomen:  Soft, NT, ND.  No mass, no HSM.  No hernia.  Active BS.   Rectal: deferred   Musc/Skeltl: no joint swelling.  Deformity of right wrist.   Extremities:  No CCE  Neurologic:  Pleasant, alert and oriented x 3.   Skin:  No rash, no sores Tattoos:  none Nodes:  No cervical adenopathy   Psych:  Pleasant, cooperative, calm.   LAB RESULTS:  Recent Labs  07/17/16 1153 07/18/16 0728 07/19/16 0248  WBC 3.7* 2.5* 2.0*  HGB 12.4 11.3* 10.6*  HCT 36.2 35.0* 31.8*  PLT 257 174 142*   BMET Lab Results  Component Value Date   NA 136 07/19/2016   NA 136 07/18/2016   NA 138 07/17/2016   K 3.3 (L) 07/19/2016   K 3.8 07/18/2016   K 3.7 07/17/2016   CL 100 (L) 07/19/2016   CL 100 (L) 07/18/2016   CL 99 (L) 07/17/2016   CO2 24 07/19/2016   CO2 25 07/18/2016   CO2 23 07/17/2016   GLUCOSE 94 07/19/2016   GLUCOSE 82 07/18/2016   GLUCOSE 87 07/17/2016   BUN 10 07/19/2016   BUN 13 07/18/2016   BUN 9 07/17/2016   CREATININE 0.94 07/19/2016   CREATININE 1.03 (H) 07/18/2016   CREATININE 0.88 07/17/2016   CALCIUM 7.7 (L) 07/19/2016   CALCIUM 7.8 (L) 07/18/2016   CALCIUM 8.7 (L) 07/17/2016   LFT  Recent Labs  07/17/16 1153  PROT 6.3*  ALBUMIN 3.7  AST 113*  ALT 36  ALKPHOS 77  BILITOT 0.8   Lipase     Component Value Date/Time   LIPASE 66 (H) 07/17/2016 1153    Drugs of Abuse     Component Value Date/Time   LABOPIA NONE DETECTED 07/17/2016 1306   COCAINSCRNUR NONE DETECTED 07/17/2016 1306   COCAINSCRNUR NEGATIVE 04/24/2008 1430   LABBENZ NONE DETECTED 07/17/2016 1306   LABBENZ NEGATIVE 04/24/2008 1430   AMPHETMU NONE DETECTED 07/17/2016 1306   THCU NONE DETECTED 07/17/2016 1306   LABBARB NONE DETECTED 07/17/2016 1306       IMPRESSION:   *  Dysphagia.   Candida esophagitis, HH, gastritis on previous EGD x 2.  *  Macrocytic anemia.   *  Hypokalemia.  Hypomagnesemia.    *  Elevated LFTs and mild elevation Lipase. Elevated blood ETOH level.  Fatty liver and ? Cirrhosis per CT 03/2016.  Pt says has drunk no ETOH for several months.   *  Thrombocytopenia. Intermittent.  Dates to 09/2013.       PLAN:     *  EGD ~ noon tomorrow.  Stop Lovenox now so we can safely dilate esophagus if needed.   *  Potassium level in AM.     Azucena Freed  07/19/2016, 2:11 PM Pager: [place] Attending   I have taken an interval history, reviewed the  chart and examined the patient. I agree with the Advanced Practitioner's note, impression and recommendations.   Somewhat odd situation with recurrent dysphagia ? Pharyngeal/esophageal or both with recurrent Candida and ? Motility changes - suspected narrowed esophagus and proximal diverticulum on Ba swallow 2015 (images viewed) Will evaluate with EGD tomorrow, possible dilation tough if Candida not - and may need MBS  Gatha Mayer, MD, [facility] [phone] (pager) [phone] after 5 PM, weekends and holidays  07/19/2016 3:58 PM

## 2016-07-19 NOTE — Evaluation (Signed)
Occupational Therapy Evaluation Patient Details Name: [name] MRN: [id number] DOB: 1960-12-08 Today's Date: 07/19/2016    History of Present Illness y.o. female presenting with squeezing abdominal pain along with nausea and vomiting. PMH is significant for alcohol abuse, major depressive disorder, NSTEMI with cardiac arrest, GERD, recurrent aspiration, hypertension, dysphagia, hyperlipidemia   Clinical Impression   Pt admitted with the above diagnosis and overall is at or close to her baseline with adls despite being attached to monitors. Pt can complete all basic adls. Pt does fatigue quickly on a regular basis.  Discussed energy conservation techniques and provided a handout.  No further acute OT needs at this time.      Follow Up Recommendations  No OT follow up;Supervision - Intermittent    Equipment Recommendations  None recommended by OT    Recommendations for Other Services       Precautions / Restrictions Precautions Precautions: Fall Precaution Comments: Feel three times in last year. Restrictions Weight Bearing Restrictions: No      Mobility Bed Mobility Overal bed mobility: Independent                Transfers Overall transfer level: Independent Equipment used: None             General transfer comment: no physical assist required    Balance Overall balance assessment: No apparent balance deficits (not formally assessed)                                          ADL Overall ADL's : At baseline                                       General ADL Comments: Pt at baseline despite being attached to monitors.     Vision Baseline Vision/History: No visual deficits Patient Visual Report: No change from baseline       Perception Perception Perception Tested?: No   Praxis Praxis Praxis tested?: Within functional limits    Pertinent Vitals/Pain Pain Assessment: No/denies pain     Hand Dominance  Right   Extremity/Trunk Assessment Upper Extremity Assessment Upper Extremity Assessment: Overall WFL for tasks assessed   Lower Extremity Assessment Lower Extremity Assessment: Defer to PT evaluation   Cervical / Trunk Assessment Cervical / Trunk Assessment: Normal   Communication Communication Communication: Deaf   Cognition Arousal/Alertness: Awake/alert Behavior During Therapy: WFL for tasks assessed/performed Overall Cognitive Status: Within Functional Limits for tasks assessed                 General Comments: Pt states she is limited at baseline due to her cardiac issues.  Has to go down 3 flights of stairs to do laundry so her 56 year old does all laundry.   General Comments  Pt c/o being tired.  Reviwed some energy conservation techniques and will provide with hand out.    Exercises       Shoulder Instructions      Home Living Family/patient expects to be discharged to:: Private residence Living Arrangements: Children Available Help at Discharge: Available PRN/intermittently Type of Home: Apartment Home Access: Level entry     Home Layout: One level     Bathroom Shower/Tub: Tub/shower unit Shower/tub characteristics: Architectural technologist: Standard     Home Equipment: Tub bench  Additional Comments: dauther is visiting relatives in [place] for the summer       Prior Functioning/Environment Level of Independence: Independent        Comments: independent but limited overall activity tolerance at baseline        OT Problem List:        OT Treatment/Interventions:      OT Goals(Current goals can be found in the care plan section) Acute Rehab OT Goals Patient Stated Goal: to go home OT Goal Formulation: All assessment and education complete, DC therapy  OT Frequency:     Barriers to D/C:            Co-evaluation              End of Session Nurse Communication: Mobility status  Activity Tolerance: Patient limited by  fatigue Patient left: in bed;with call bell/phone within reach  OT Visit Diagnosis: History of falling (Z91.81)                ADL either performed or assessed with clinical judgement  Time: 0930-0950 OT Time Calculation (min): 20 min Charges:  OT General Charges $OT Visit: 1 Procedure OT Evaluation $OT Eval Moderate Complexity: 1 Procedure G-Codes:     Jinger Neighbors, OTR/L  Glenford Peers 07/19/2016, 10:01 AM  [phone]

## 2016-07-19 NOTE — Progress Notes (Signed)
Patient Name: [name]      SUBJECTIVE:abd pain better today  Past Medical History:  Diagnosis Date  . Allergy    SEASONAL  . Anemia   . Anxiety   . Arthritis   . ASD (atrial septal defect)    a. s/p ASD repair in 1973  . CAD (coronary artery disease)    a. 12/2015: NSTEMI occurring after cardiac arrest secondary to aspiration PNA. Echo w/ EF of 55-60%, no WMA. Outpt ischemic eval needed.  . Cardiomyopathy   . Deaf   . Depression   . Dysphagia    due to esophageal infections.  . Fibromyalgia   . Gastritis   . GERD (gastroesophageal reflux disease)   . Hearing impairment   . Hypertension    [name], take htn medication to regulate heart beat.  . Migraine   . Osteoporosis   . Pulmonary embolism ([place])    occured post c-section of her daughter  . Sleep apnea   . Vitamin D deficiency     Scheduled Meds:  Scheduled Meds: . aspirin  81 mg Oral Daily  . atorvastatin  40 mg Oral q1800  . cefTRIAXone (ROCEPHIN)  IV  1 g Intravenous Q24H  . Chlorhexidine Gluconate Cloth  6 each Topical Q0600  . enoxaparin (LOVENOX) injection  40 mg Subcutaneous Q24H  . feeding supplement (ENSURE ENLIVE)  237 mL Oral BID BM  . folic acid  1 mg Oral Daily  . gi cocktail  30 mL Oral BID  . metoprolol tartrate  75 mg Oral BID  . mupirocin ointment  1 application Nasal BID  . pantoprazole  40 mg Oral BID  . sodium chloride  1,000 mL Intravenous Once  . thiamine  100 mg Oral Daily  . Vilazodone HCl  40 mg Oral Daily   Continuous Infusions: acetaminophen, gi cocktail, nitroGLYCERIN, ondansetron (ZOFRAN) IV    PHYSICAL EXAM Vitals:   07/18/16 2101 07/19/16 0300 07/19/16 0551 07/19/16 0917  BP: 96/64  (!) 114/56 117/76  Pulse: 85  86 88  Resp:      Temp: 98.6 F (37 C)  97.9 F (36.6 C)   TempSrc:   Oral   SpO2:   99%   Weight:  129 lb 12.8 oz (58.9 kg)    Height:        ,Well developed and nourished in no acute distress HENT normal Neck supple with  JVP-flat Clear Regular rate and rhythm, no murmurs or gallops Abd-soft with active BS No Clubbing cyanosis edema Skin-warm and dry A & Oriented  Grossly normal sensory and motor function   TELEMETRY: Reviewed personnally pt in * NO FURTHER PAUSES     Intake/Output Summary (Last 24 hours) at 07/19/16 0948 Last data filed at 07/19/16 0300  Gross per 24 hour  Intake              830 ml  Output                0 ml  Net              830 ml    LABS: Personally reviewed   Basic Metabolic Panel:  Recent Labs Lab 07/17/16 1153 07/18/16 0604 07/19/16 0248  NA 138 136 136  K 3.7 3.8 3.3*  CL 99* 100* 100*  CO2 23 25 24   GLUCOSE 87 82 94  BUN 9 13 10   CREATININE 0.88 1.03* 0.94  CALCIUM 8.7* 7.8* 7.7*  MG 1.3*  --   --    Cardiac Enzymes:  Recent Labs  07/17/16 1823 07/17/16 2117 07/18/16 0002  TROPONINI <0.03 <0.03 <0.03   CBC:  Recent Labs Lab 07/17/16 1153 07/18/16 0728 07/19/16 0248  WBC 3.7* 2.5* 2.0*  NEUTROABS 2.0  --   --   HGB 12.4 11.3* 10.6*  HCT 36.2 35.0* 31.8*  MCV 105.2* 107.0* 103.6*  PLT 257 174 142*   PROTIME: No results for input(s): LABPROT, INR in the last 72 hours. Liver Function Tests:  Recent Labs  07/17/16 1153  AST 113*  ALT 36  ALKPHOS 77  BILITOT 0.8  PROT 6.3*  ALBUMIN 3.7    Recent Labs  07/17/16 1153  LIPASE 66*    Hemoglobin A1C:  Recent Labs  07/17/16 1823  HGBA1C 4.5*   Fasting Lipid Panel: No results for input(s): CHOL, HDL, LDLCALC, TRIG, CHOLHDL, LDLDIRECT in the last 72 hours. Thyroid Function Tests:  Recent Labs  07/17/16 1823  TSH 8.077*   Anemia Panel:     ASSESSMENT AND PLAN:  Active Problems:   Chest pain   Hypokalemia   Neurocardiogenic syncope   Leukocytopenia   Macrocytosis  wOULD STRONGLY encourage GI consult as her risk of recurrent syncope is linked to her GI pathology  I trust the leukopenia and macrocytosis are (being) evaluated-- did not see TSH FOLATE drawn--will  defer to primary service   Hypokalemia has been recurrent -- willneed repletion   Will sign off  Call for further questions  Signed, Virl Axe MD  07/19/2016

## 2016-07-19 NOTE — Plan of Care (Signed)
Problem: Pain Managment: Goal: General experience of comfort will improve Outcome: Progressing Pt with no complaints of pain and ABD pain was addressed and treated by MD. Medication changes effective and pt able to rest comfortably tonight.

## 2016-07-19 NOTE — Progress Notes (Signed)
Family Medicine Teaching Service Daily Progress Note Intern Pager: [phone]  Patient name: [name] Medical record number: [id number] Date of birth: Oct 20, 1960 Age: 56 y.o. Gender: female  Primary Care Provider: [name], MD Consultants: Cardiology Code Status: Full  Pt Overview and Major Events to Date:  3/10: Admitted to FMTS for ACS rule out  Assessment and Plan: [name] is a 56 y.o. female presenting with squeezing abdominal pain along with nausea and vomiting. PMH is significant for alcohol abuse, major depressive disorder, NSTEMI with cardiac arrest, GERD, recurrent aspiration, hypertension, dysphagia, hyperlipidemia  Atypical Chest pain, resolved:  ACS has been ruled out with negative troponins and EKG findings.  Cardiology consulted and noted likely pill esophagitis.  Will continue to treat GI symptoms with Protonix, may need backup bradycardic pacing for pause on tele. Appreciate cardiology recs.  This AM patient reports no longer has chest pain and it has not recurred since she was admitted on Saturday.  Will continue to treat GI symptoms and consult GI, appreciate recommendations.  - Continuous cardiac monitoring  - Nitroglycerin 0.4 mg every 5 minutes PRN chest pain  - scheduled GI cocktails BID and prn as well  - Protonix increased to BID 3/11 - Vitals per floor protocol - Tylenol PRN pain  Epigastric abdominal pain with nausea and vomiting, improving: History of GERD and takes Protonix daily. Lipase mildly elevated at 66. No vomiting since admission. Continues to have mild epigastric abdominal pain. Has not vomited overnight.  GI consult placed for further recs.  - continue GI cocktail and Protonix  - Zofran prn for nausea  - Tylenol prn  Hypokalemia  3.3 this AM.  Will replete with 40 Kdur x2.  -Recheck BMET in morning   Elevated TSH TSH 8.0.  Will repeat TSH today as well as obtain T3 and T4  -If new diagnosis of hypothyroidism, will start on low dose  Synthroid   Urinary tract infection: Pt with suprapubic pain.  - CTX (3/10-3/12).  - Urine culture grew >100,000 colonies Staph spp (coag negative) . Susceptibilities: resistant to Oxacillin.  Discussed with pharmacy and agree that as CTX not effective against this, can discontinue. Patient asymptomatic with no urinary symptoms.  Will not continue antibiotics for asymptomatic bacteriuria as this is likely a contaminant.     Dysphagia: Has been seen by [facility] in the past, who thought intermittent dysphagia is 2/2 recurrent esophageal candidiasis. HIV neg. - SLP swallow eval --> dysphagia III diet  - Full liquid diet for now, as Pt primarily having problems with solids - Needs GI outpatient f/u.  Will consult [facility] today to evaluate her dysphagia/syncope.   Hypotension with hx of hypertension: Initial blood pressure 92/59, which improved to 115/78 with fluid. Likely in the setting of dehydration. Takes metoprolol tartrate 75 mg twice a day and prazosin 2 mg daily.  - Continue to hold home meds  GERD: Takes 40 mg Protonix daily - Increased Protonix to BID in setting of continued GI symptoms.   History of alcohol abuse: Quit a few months ago. UDS neg. Alcohol level on admission 6. - CIWA protocol. Scores have remained 0s.   Hearing impairment: Reported to have hearing impairment secondary to recurrent ear infections. Patient indicates by reading lips. - Hearing aid in place  Major depressive disorder/generalized anxiety disorder: Patient takes volazodone 40 mg daily - continue volazodone 40 mg daily  Hyperlipidemia: Takes atorvastatin 40 mg daily. -Continue atorvastatin  Hx of cardiac arrest and cardiac surgery: Hx of  6 minute cardiac arrest after respiratory failure after choking on a pill, was found to have NSTEMI afterwards; this was all in August 2017. S/p ASD and MV repair as well. Had nuclear stress test in 9/17 which was negative. Last saw cardiologist in Oct 2017. -  See plan above. - Needs outpt cardiology f/u on discharge.  Mild Leukopenia: WBC 3.7>2.5>2.0.  HIV negative.   -CBC w/ diff ordered with AM labs.  Peripheral smear to evaluate leukopenia.   High MCV -MCV of 103.   -Will order anemia panel to evaluate.  Hypothyroidism may explain this however patient does not have a formal diagnosis of hypothyroidism yet.  Will recheck TSH and also obtain T3, T4 as mentioned above.   FEN/GI: Dysphagia III diet  Prophylaxis: Lovenox  Disposition: ACS ruled out. GI to see and evaluate dysphagia.   Subjective:  Pt states her chest pain has resolved.  Mild epigastric abdominal pain.   No vomiting overnight.  Otherwise no concerns at this time and is feeling better.    Objective: Temp:  [97.9 F (36.6 C)-98.6 F (37 C)] 98.2 F (36.8 C) (03/12 1413) Pulse Rate:  [78-94] 78 (03/12 1413) Resp:  [16] 16 (03/12 1413) BP: (91-117)/(56-76) 91/76 (03/12 1413) SpO2:  [97 %-100 %] 100 % (03/12 1413) Weight:  [129 lb 12.8 oz (58.9 kg)] 129 lb 12.8 oz (58.9 kg) (03/12 0300)   Physical Exam: General: Laying in bed, in NAD  HEENT: EOMI, MMM Neck:  Supple, no LAD  Cardiovascular: RRR, no MRG, 2+ palpable pulses  Respiratory:  normal work of breathing, lungs clear   Gastrointestinal: +BS, soft  MSK: No edema  Derm: warm and dry  Neuro: AAOx3, no focal deficits Psych: Normal mood and affect   Laboratory:  Recent Labs Lab 07/17/16 1153 07/18/16 0728 07/19/16 0248  WBC 3.7* 2.5* 2.0*  HGB 12.4 11.3* 10.6*  HCT 36.2 35.0* 31.8*  PLT 257 174 142*    Recent Labs Lab 07/17/16 1153 07/18/16 0604 07/19/16 0248  NA 138 136 136  K 3.7 3.8 3.3*  CL 99* 100* 100*  CO2 23 25 24   BUN 9 13 10   CREATININE 0.88 1.03* 0.94  CALCIUM 8.7* 7.8* 7.7*  PROT 6.3*  --   --   BILITOT 0.8  --   --   ALKPHOS 77  --   --   ALT 36  --   --   AST 113*  --   --   GLUCOSE 87 82 94    Imaging/Diagnostic Tests: CXR 3/10: Neg  Lovenia Kim, MD 07/19/2016, 2:47  PM PGY-1, [place] Intern pager: [phone], text pages welcome

## 2016-07-20 ENCOUNTER — Encounter (HOSPITAL_COMMUNITY): Payer: Self-pay | Admitting: *Deleted

## 2016-07-20 ENCOUNTER — Encounter (HOSPITAL_COMMUNITY)
Admission: EM | Disposition: A | Discharge status: Home / Self Care | Payer: Self-pay | Source: Home / Self Care | Attending: Family Medicine

## 2016-07-20 ENCOUNTER — Inpatient Hospital Stay (HOSPITAL_COMMUNITY): Payer: Medicare Other | Admitting: Anesthesiology

## 2016-07-20 DIAGNOSIS — B3781 Candidal esophagitis: Principal | ICD-10-CM

## 2016-07-20 DIAGNOSIS — K222 Esophageal obstruction: Secondary | ICD-10-CM

## 2016-07-20 DIAGNOSIS — D72819 Decreased white blood cell count, unspecified: Secondary | ICD-10-CM

## 2016-07-20 HISTORY — PX: ESOPHAGOGASTRODUODENOSCOPY (EGD) WITH PROPOFOL: SHX5813

## 2016-07-20 LAB — CBC WITH DIFFERENTIAL/PLATELET
BASOS ABS: 0 10*3/uL (ref 0.0–0.1)
Basophils Relative: 1 %
Eosinophils Absolute: 0.1 10*3/uL (ref 0.0–0.7)
Eosinophils Relative: 4 %
HEMATOCRIT: 31.5 % — AB (ref 36.0–46.0)
Hemoglobin: 10.4 g/dL — ABNORMAL LOW (ref 12.0–15.0)
LYMPHS ABS: 0.5 10*3/uL — AB (ref 0.7–4.0)
Lymphocytes Relative: 32 %
MCH: 35.7 pg — ABNORMAL HIGH (ref 26.0–34.0)
MCHC: 33 g/dL (ref 30.0–36.0)
MCV: 108.2 fL — AB (ref 78.0–100.0)
MONOS PCT: 18 %
Monocytes Absolute: 0.3 10*3/uL (ref 0.1–1.0)
NEUTROS ABS: 0.8 10*3/uL — AB (ref 1.7–7.7)
Neutrophils Relative %: 45 %
Platelets: 121 10*3/uL — ABNORMAL LOW (ref 150–400)
RBC: 2.91 MIL/uL — ABNORMAL LOW (ref 3.87–5.11)
RDW: 15.7 % — AB (ref 11.5–15.5)
WBC: 1.7 10*3/uL — ABNORMAL LOW (ref 4.0–10.5)

## 2016-07-20 LAB — BASIC METABOLIC PANEL
Anion gap: 7 (ref 5–15)
BUN: 6 mg/dL (ref 6–20)
CALCIUM: 8.3 mg/dL — AB (ref 8.9–10.3)
CO2: 27 mmol/L (ref 22–32)
CREATININE: 0.87 mg/dL (ref 0.44–1.00)
Chloride: 107 mmol/L (ref 101–111)
GFR calc non Af Amer: >60 mL/min (ref >60)
Glucose, Bld: 91 mg/dL (ref 65–99)
Potassium: 4.4 mmol/L (ref 3.5–5.1)
Sodium: 141 mmol/L (ref 135–145)

## 2016-07-20 LAB — T3, FREE: T3 FREE: 1.9 pg/mL — AB (ref 2.0–4.4)

## 2016-07-20 SURGERY — ESOPHAGOGASTRODUODENOSCOPY (EGD) WITH PROPOFOL
Anesthesia: Monitor Anesthesia Care

## 2016-07-20 MED ORDER — FLUCONAZOLE 200 MG PO TABS
200.0000 mg | ORAL_TABLET | Freq: Once | ORAL | Status: AC
  Administered 2016-07-20: 200 mg via ORAL
  Filled 2016-07-20: qty 1

## 2016-07-20 MED ORDER — PROPOFOL 10 MG/ML IV BOLUS
INTRAVENOUS | Status: DC | PRN
  Administered 2016-07-20: 20 mg via INTRAVENOUS

## 2016-07-20 MED ORDER — DEXTROSE-NACL 5-0.45 % IV SOLN
INTRAVENOUS | Status: DC
  Filled 2016-07-20: qty 1000

## 2016-07-20 MED ORDER — LACTATED RINGERS IV SOLN
INTRAVENOUS | Status: DC | PRN
  Administered 2016-07-20: 13:00:00 via INTRAVENOUS

## 2016-07-20 MED ORDER — BOOST / RESOURCE BREEZE PO LIQD
1.0000 | Freq: Three times a day (TID) | ORAL | Status: DC
  Administered 2016-07-20 (×2): 1 via ORAL

## 2016-07-20 MED ORDER — SODIUM CHLORIDE 0.9 % IV SOLN: INTRAVENOUS | Status: DC

## 2016-07-20 MED ORDER — FLUCONAZOLE 100 MG PO TABS
100.0000 mg | ORAL_TABLET | Freq: Every day | ORAL | Status: DC
  Administered 2016-07-21: 100 mg via ORAL
  Filled 2016-07-20: qty 1

## 2016-07-20 MED ORDER — ENOXAPARIN SODIUM 40 MG/0.4ML ~~LOC~~ SOLN
40.0000 mg | SUBCUTANEOUS | Status: DC
Start: 2016-07-20 — End: 2016-07-21
  Administered 2016-07-20: 40 mg via SUBCUTANEOUS
  Filled 2016-07-20: qty 0.4

## 2016-07-20 MED ORDER — PROPOFOL 500 MG/50ML IV EMUL
INTRAVENOUS | Status: DC | PRN
  Administered 2016-07-20: 100 ug/kg/min via INTRAVENOUS

## 2016-07-20 SURGICAL SUPPLY — 15 items

## 2016-07-20 NOTE — Interval H&P Note (Signed)
History and Physical Interval Note:  07/20/2016 12:35 PM  [name]  has presented today for surgery, with the diagnosis of dysphagia.  hx candida esophagitis.  The various methods of treatment have been discussed with the patient and family. After consideration of risks, benefits and other options for treatment, the patient has consented to  Procedure(s): ESOPHAGOGASTRODUODENOSCOPY (EGD) WITH PROPOFOL (N/A) SAVORY DILATION (N/A) as a surgical intervention .  The patient's history has been reviewed, patient examined, no change in status, stable for surgery.  I have reviewed the patient's chart and labs.  Questions were answered to the patient's satisfaction.     Silvano Rusk

## 2016-07-20 NOTE — Progress Notes (Signed)
Initial Nutrition Assessment  DOCUMENTATION CODES:   Non-severe (moderate) malnutrition in context of chronic illness  INTERVENTION:    Boost Breeze po TID, each supplement provides 250 kcal and 9 grams of protein  Can offer Ensure Enlive po BID-TID when diet advanced past clear liquids, each supplement provides 350 kcal and 20 grams of protein  NUTRITION DIAGNOSIS:   Malnutrition related to chronic illness as evidenced by mild depletion of muscle mass, moderate depletions of muscle mass, percent weight loss (15% weight loss within 6 months).  GOAL:   Patient will meet greater than or equal to 90% of their needs  MONITOR:   Diet advancement, PO intake, Supplement acceptance  REASON FOR ASSESSMENT:   Malnutrition Screening Tool    ASSESSMENT:   56 y.o. female presenting with squeezing abdominal pain along with nausea and vomiting. PMH is significant for alcohol abuse, major depressive disorder, NSTEMI with cardiac arrest, GERD, recurrent aspiration, hypertension, dysphagia, hyperlipidemia.  Patient reports recent weight loss that she was not trying to lose, but is happy with the weight loss. She wants to maintain current weight and build muscle. She recently received a scholarship for a membership to [employer]. She was eating soft foods and liquids PTA. She had a poor appetite on admission due to a recent virus.  Nutrition-Focused physical exam completed. Findings are no fat depletion, mild-moderate muscle depletion, and no edema.  Patient with 15% weight loss within the past 6 months.  S/P EGD this AM, patient reports they were able to stretch her esophagus. Plans to start with clear liquids and slowly work up to soft solids. Currently on clear liquids.  Labs reviewed. Medications reviewed and include folic acid, thiamine.  Diet Order:  Diet clear liquid Room service appropriate? Yes; Fluid consistency: Thin  Skin:  Reviewed, no issues  Last BM:  3/12  Height:   Ht  Readings from Last 1 Encounters:  07/17/16 5\' 3"  (1.6 m)    Weight:   Wt Readings from Last 1 Encounters:  07/20/16 128 lb 12.8 oz (58.4 kg)    Ideal Body Weight:  52.3 kg  BMI:  Body mass index is 22.82 kg/m.  Estimated Nutritional Needs:   Kcal:  1600-1800  Protein:  80-90 gm  Fluid:  1.6-1.8 L  EDUCATION NEEDS:   Education needs addressed  Molli Barrows, [place], [place], [place] Pager [phone] After Hours Pager [phone]

## 2016-07-20 NOTE — Op Note (Signed)
[facility] Patient Name: [name] Procedure Date : 07/20/2016 MRN: [id number] Attending MD: Gatha Mayer , MD Date of Birth: Aug 03, 1960 CSN: [id number] Age: 56 Admit Type: Inpatient Procedure:                Upper GI endoscopy Indications:              Dysphagia Providers:                Gatha Mayer, MD, Vista Lawman, RN, Cherylynn Ridges, Technician, Rhae Lerner, CRNA Referring MD:              Medicines:                Propofol per Anesthesia, Monitored Anesthesia Care Complications:            No immediate complications. Estimated blood loss:                            Minimal. Estimated Blood Loss:     Estimated blood loss was minimal. Procedure:                Pre-Anesthesia Assessment:                           - Prior to the procedure, a History and Physical                            was performed, and patient medications and                            allergies were reviewed. The patient's tolerance of                            previous anesthesia was also reviewed. The risks                            and benefits of the procedure and the sedation                            options and risks were discussed with the patient.                            All questions were answered, and informed consent                            was obtained. Prior Anticoagulants: The patient                            last took Lovenox (enoxaparin) 1 day prior to the                            procedure. ASA Grade Assessment: III - A patient  with severe systemic disease. After reviewing the                            risks and benefits, the patient was deemed in                            satisfactory condition to undergo the procedure.                           After obtaining informed consent, the endoscope was                            passed under direct vision. Throughout the   procedure, the patient's blood pressure, pulse, and                            oxygen saturations were monitored continuously. The                            [id number] ([id number]) scope was introduced through the                            mouth, and advanced to the second part of duodenum.                            The upper GI endoscopy was accomplished without                            difficulty. The patient tolerated the procedure                            well. Scope In: Scope Out: Findings:      Diffuse candidiasis was found in the upper third of the esophagus and in       the middle third of the esophagus.      A obstructing Schatzki ring (acquired) was found at the gastroesophageal       junction. Dilated with scope when gentle pressure applied - small tear       as would be sen with balloon dilation - 11 mm lumen to 14-15 mm lumen i       think. Estimated blood loss was minimal.      A 4 cm hiatal hernia was present. 33-37 cm      The exam was otherwise without abnormality.      The cardia and gastric fundus were normal on retroflexion. Impression:               - Monilial esophagitis.                           - Obstructing Schatzki ring.                           - 4 cm hiatal hernia.                           - The examination was otherwise normal.                           -  No specimens collected. Moderate Sedation:      Please see anesthesia notes, moderate sedation not given Recommendation:           - Return patient to hospital ward for ongoing care.                           - Clear liquid diet - advance as tolerated to soft                            diet.                           - Continue present medications.                           - Will also treat with fluconazole - not sure how                            much impact mild candidiasis having                           Ring main issue - will need f/u Dr. Ardis Hughs in                            office in 1 month  or so to see if repeat dilation                            needed - could need to go higher on diameter Procedure Code(s):        --- Professional ---                           [phone], Esophagogastroduodenoscopy, flexible,                            transoral; diagnostic, including collection of                            specimen(s) by brushing or washing, when performed                            (separate procedure) Diagnosis Code(s):        --- Professional ---                           B37.81, Candidal esophagitis                           K22.2, Esophageal obstruction                           K44.9, Diaphragmatic hernia without obstruction or                            gangrene  R13.10, Dysphagia, unspecified CPT copyright 2016 American Medical Association. All rights reserved. The codes documented in this report are preliminary and upon coder review may  be revised to meet current compliance requirements. Gatha Mayer, MD 07/20/2016 1:56:19 PM This report has been signed electronically. Number of Addenda: 0

## 2016-07-20 NOTE — Transfer of Care (Signed)
Immediate Anesthesia Transfer of Care Note  Patient: HOLIDAY MCMENAMIN  Procedure(s) Performed: Procedure(s): ESOPHAGOGASTRODUODENOSCOPY (EGD) WITH PROPOFOL (N/A)  Patient Location: Endoscopy Unit  Anesthesia Type:MAC  Level of Consciousness: awake, alert  and oriented  Airway & Oxygen Therapy: Patient Spontanous Breathing and Patient connected to nasal cannula oxygen  Post-op Assessment: Report given to RN, Post -op Vital signs reviewed and stable and Patient moving all extremities X 4  Post vital signs: Reviewed and stable  Last Vitals:  Vitals:   07/20/16 1214 07/20/16 1345  BP: 114/75 108/64  Pulse: 84 77  Resp: 16 10  Temp:  36.6 C    Last Pain:  Vitals:   07/20/16 1345  TempSrc: Oral  PainSc:       Patients Stated Pain Goal: 0 (21/11/73 5670)  Complications: No apparent anesthesia complications

## 2016-07-20 NOTE — Anesthesia Preprocedure Evaluation (Addendum)
Anesthesia Evaluation  Patient identified by MRN, date of birth, ID band Patient awake    Reviewed: Allergy & Precautions, NPO status , Patient's Chart, lab work & pertinent test results  Airway Mallampati: I       Dental  (+) Poor Dentition   Pulmonary sleep apnea , pneumonia,    breath sounds clear to auscultation       Cardiovascular hypertension, + CAD and + Past MI   Rhythm:Regular Rate:Normal     Neuro/Psych  Headaches,    GI/Hepatic Neg liver ROS, GERD  ,  Endo/Other    Renal/GU Renal disease     Musculoskeletal  (+) Arthritis , Fibromyalgia -  Abdominal   Peds  Hematology  (+) anemia ,   Anesthesia Other Findings   Reproductive/Obstetrics                           Anesthesia Physical Anesthesia Plan  ASA: III  Anesthesia Plan: MAC   Post-op Pain Management:    Induction: Intravenous  Airway Management Planned: Nasal Cannula and Simple Face Mask  Additional Equipment:   Intra-op Plan:   Post-operative Plan:   Informed Consent: I have reviewed the patients History and Physical, chart, labs and discussed the procedure including the risks, benefits and alternatives for the proposed anesthesia with the patient or authorized representative who has indicated his/her understanding and acceptance.   Dental advisory given  Plan Discussed with: CRNA and Anesthesiologist  Anesthesia Plan Comments:        Anesthesia Quick Evaluation

## 2016-07-20 NOTE — Progress Notes (Signed)
Family Medicine Teaching Service Daily Progress Note Intern Pager: [phone]  Patient name: [name] Medical record number: [id number] Date of birth: 12-14-1960 Age: 56 y.o. Gender: female  Primary Care Provider: [name], MD Consultants: Cardiology Code Status: Full  Pt Overview and Major Events to Date:  3/10: Admitted to FMTS for ACS rule out  Assessment and Plan: [name] is a 56 y.o. female presenting with squeezing abdominal pain along with nausea and vomiting. PMH is significant for alcohol abuse, major depressive disorder, NSTEMI with cardiac arrest, GERD, recurrent aspiration, hypertension, dysphagia, hyperlipidemia  Atypical Chest pain, resolved:  ACS has been ruled out with negative troponins and EKG findings.  Cardiology consulted and noted likely pill esophagitis.  Will continue Protonix, may need backup bradycardic pacing for pause on tele. Appreciate cardiology recs.  Patient reports no longer has chest pain and it has not recurred since admission.  GI consult placed 3/12.   -GI recommendations, appreciate recs:  EGD around noon today, stop Lovenox so can safely dilate esophagus if necessary, K+ level in AM.  - cardiac monitoring   - Nitroglycerin 0.4 mg every 5 minutes PRN chest pain  - scheduled GI cocktails BID and prn as well  - Protonix BID - Tylenol PRN pain - Vitals per floor protocol  Epigastric abdominal pain with nausea and vomiting, improving:  History of GERD and takes Protonix daily.  No vomiting since admission.  Reports abdominal pain has improved.   - GI to do EGD today.   - continue GI cocktail and Protonix   - Zofran prn for nausea  - Tylenol prn  Hypokalemia  4.4 this AM.  -Daily BMET   Elevated TSH, resolved.  TSH 8.0.  Repeat TSH 2.9 with normal T4, low T3 of 1.9.  Will not need to start medications as labs are normal.   Urinary tract infection, resolved: Patient asymptomatic, CTX discontinued 3/12 as coag negative staph,  likely  contaminant.   Dysphagia: Has been seen by [facility] in the past, who thought intermittent dysphagia is 2/2 recurrent esophageal candidiasis. HIV neg.  - SLP swallow eval --> dysphagia III diet  - Full liquid diet for now, as Pt primarily having problems with solids - Needs GI outpatient f/u.   Hypotension with hx of hypertension: Initial blood pressure 92/59, which improved to 115/78 with fluid. Likely in the setting of dehydration. Takes metoprolol tartrate 75 mg twice a day and prazosin 2 mg daily.  - Continue to hold home meds   GERD: Takes 40 mg Protonix daily - Increased Protonix to BID in setting of continued GI symptoms.   History of alcohol abuse: Quit a few months ago. UDS neg. Alcohol level on admission 6. - CIWA protocol. Scores have remained 0s.  -will stop monitoring CIWA scores if remain zeros overnight.   Hearing impairment: Reported to have hearing impairment secondary to recurrent ear infections. Patient indicates by reading lips. - Hearing aid in place  Major depressive disorder/generalized anxiety disorder: Patient takes volazodone 40 mg daily - continue volazodone 40 mg daily  Hyperlipidemia: Takes atorvastatin 40 mg daily. -Continue atorvastatin  Hx of cardiac arrest and cardiac surgery: Hx of 6 minute cardiac arrest after respiratory failure after choking on a pill, was found to have NSTEMI afterwards; this was all in August 2017. S/p ASD and MV repair as well. Had nuclear stress test in 9/17 which was negative. Last saw cardiologist in Oct 2017. - See plan above. - Needs outpt cardiology f/u on  discharge.  Leukopenia: WBC 3.7>2.5>2.0>1.7.  HIV negative.   -CBC w/ diff ordered with AM labs. ANC low 0.8.   Peripheral smear pending. Will monitor white count for now, might be dilutional.     High MCV -MCV of 103.   -Will order anemia panel to evaluate. Retics low 3.10.  Vit B12 and folate wnl.  TIBC low 239, Iron 37 wnl, ferritin 221 normal, possibly  suggestive of anemia of chronic disease.    FEN/GI: Dysphagia III diet  Prophylaxis: Lovenox  Disposition: ACS ruled out. GI to see and evaluate dysphagia.   Subjective:  Chest pain resolved. Has not vomited overnight.  She is feeling better and has no concerns at this time.    Objective: Temp:  [97.4 F (36.3 C)-98.4 F (36.9 C)] 98.4 F (36.9 C) (03/13 1526) Pulse Rate:  [75-87] 85 (03/13 1439) Resp:  [10-29] 29 (03/13 1355) BP: (100-120)/(64-92) 120/74 (03/13 1439) SpO2:  [99 %-100 %] 100 % (03/13 1355) Weight:  [128 lb 12.8 oz (58.4 kg)] 128 lb 12.8 oz (58.4 kg) (03/13 0442)   Physical Exam: General: Sitting up in bed, in NAD  HEENT: EOMI, MMM  Neck:  Supple, no LAD  Cardiovascular: RRR, no MRG  Respiratory:  CTA B/L with comfortable work of breathing  Gastrointestinal: +BS, soft  MSK: No edema  Derm: warm and dry  Neuro: AAOx3, no focal deficits Psych: Normal mood and affect   Laboratory:  Recent Labs Lab 07/18/16 0728 07/19/16 0248 07/20/16 0307  WBC 2.5* 2.0* 1.7*  HGB 11.3* 10.6* 10.4*  HCT 35.0* 31.8* 31.5*  PLT 174 142* 121*    Recent Labs Lab 07/17/16 1153 07/18/16 0604 07/19/16 0248 07/20/16 0307  NA 138 136 136 141  K 3.7 3.8 3.3* 4.4  CL 99* 100* 100* 107  CO2 23 25 24 27   BUN 9 13 10 6   CREATININE 0.88 1.03* 0.94 0.87  CALCIUM 8.7* 7.8* 7.7* 8.3*  PROT 6.3*  --   --   --   BILITOT 0.8  --   --   --   ALKPHOS 77  --   --   --   ALT 36  --   --   --   AST 113*  --   --   --   GLUCOSE 87 82 94 91    Imaging/Diagnostic Tests: CXR 3/10: Neg  Lovenia Kim, MD 07/20/2016, 3:53 PM PGY-1, [facility] Intern pager: [phone], text pages welcome

## 2016-07-20 NOTE — H&P (View-Only) (Signed)
[facility] Consult: 2:11 PM 07/19/2016  LOS: 1 day    Referring Provider: Dr Mingo Amber  Primary Care Physician:  [name], MD Primary Gastroenterologist:  Dr. Ardis Hughs     Reason for Consultation:  Non cardiac chest pain, n/v.     HPI: [name] is a 56 y.o. female.  PMH OSA.  Anemia, IDA and macrocytic.  Thrombocytopenia. S/p OHS with MV repair 2007, ASD repair 1973.  Non-STEMI in setting cardiac arrest and aspiration PNA 12/2015.  Hearing impairment.  Depression.  ~ 2004 PE post c-section.  07/2010 Lap chole.    03/2016 CT ab/pelvis with contrast.  Hepatic steatosis with suspicion of mild cirrhosis. 03/2014 EGD for intermittent dysphagia and eophageal narrowing on esophagram.  Cancicial esophagitis, mainly proximal esophagus.  Normal GE Jx.  4 cm HH with resulting foreshortened and tortuous esophagus. Plan: 10 days Diflucan.   03/2014 barium esophagram: Diffuse fixed esophageal narrowing noted throughout the mid and distal esophagus. There is associated proximal thoracic esophagus diverticulum. These findings suggest a long-standing process resulting in diffuse esophageal scarring. Chronic reflux esophagitis and scleroderma could present in this fashion. A process such as Barrett's esophagus or esophageal malignancy cannot be excluded. The esophageal narrowing resulted in significant delay in passage of a standardized barium tablet. Further evaluation with upper endoscopy should be considered.  Small sliding hiatal hernia. No gastroesophageal reflux could be demonstrated. 10/2013 EGD for anemia, FOBT +, vomiting, abd pain.  HH, candida esophagitis, gastritis. Path: reactive gastropathy, no H Pylori.  10/2013 Colonoscopy.  3 mm sessile, transverse colon polyp, removed but not retrieved. Few small left colon tics.  Plan:  repeat colonoscopy in 2019.  10/2007 Colonoscopy.  For FOBT+.  Normal study.   Long hx of intermittent choking/gagging at UE Jx region with PO.  Also intermittent, less frequent dysphagia at LES that manifests with silent sxs but will lead to accumulation of sputum and needing to spit.  Says he has had food disimpaction in past but do not find this in records.   Pt admitted with c/o pain, vomiting.  She had event of choking on a pill she was taking for "racing heart".  Soon after unable to get water to pass.  Then had sternal pain.  Epigastric pain and ? Syncope after arrival to ED.  Eventually the acute dysphagis resolved.  Bradycardia and prolonged pauses attributed to vasovagal response to choking on pill that got stuck in her throat. Pt ruled out for ACS.  Per Dr Caryl Comes primary issue is "her GI tract... "Perhaps not dissimilarly from deglutition syncope, in the event that she has a non-remediable esophageal problem she may need backup bradycardia pacing.   Blood ETOH is elevated at 6.  She says no ETOH for several months.        Past Medical History:  Diagnosis Date  . Allergy    SEASONAL  . Anemia   . Anxiety   . Arthritis   . ASD (atrial septal defect)    a. s/p ASD repair in 1973  . CAD (coronary artery disease)  a. 12/2015: NSTEMI occurring after cardiac arrest secondary to aspiration PNA. Echo w/ EF of 55-60%, no WMA. Outpt ischemic eval needed.  . Cardiomyopathy   . Deaf   . Depression   . Dysphagia    due to esophageal infections.  . Fibromyalgia   . Gastritis   . GERD (gastroesophageal reflux disease)   . Hearing impairment   . Hypertension    [name], take htn medication to regulate heart beat.  . Migraine   . Osteoporosis   . Pulmonary embolism ([place])    occured post c-section of her daughter  . Sleep apnea   . Vitamin D deficiency     Past Surgical History:  Procedure Laterality Date  . APPENDECTOMY    . CARDIAC SURGERY  January 2007   mitral valve repair  .  CARDIAC SURGERY  1973   atrial septal defect  . CARDIAC SURGERY    . COCHLEAR IMPLANT    . INNER EAR SURGERY     TUBES  . LAPAROSCOPIC CHOLECYSTECTOMY  2012  . TONSILLECTOMY AND ADENOIDECTOMY    . TUBAL LIGATION      Prior to Admission medications   Medication Sig Start Date End Date Taking? Authorizing Provider  aspirin 81 MG chewable tablet Chew 81 mg by mouth daily.   Yes Historical Provider, MD  atorvastatin (LIPITOR) 40 MG tablet Take 1 tablet (40 mg total) by mouth daily at 6 PM. 02/02/16  Yes [name], MD  Cholecalciferol (VITAMIN D3) 2000 units TABS Take 2,000 Units by mouth daily.    Yes Historical Provider, MD  fexofenadine (ALLEGRA ALLERGY) 180 MG tablet Take 180 mg by mouth daily.   Yes Historical Provider, MD  fluticasone (FLONASE) 50 MCG/ACT nasal spray Place 2 sprays into both nostrils daily.   Yes Historical Provider, MD  folic acid (FOLVITE) 1 MG tablet Take 1 mg by mouth daily.   Yes Historical Provider, MD  Metoprolol Tartrate 75 MG TABS Take 75 mg by mouth 2 (two) times daily. 02/02/16  Yes [name], MD  nitroGLYCERIN (NITROSTAT) 0.4 MG SL tablet Place 1 tablet (0.4 mg total) under the tongue every 5 (five) minutes as needed for chest pain. 05/11/16  Yes [name], MD  Omega-3 Fatty Acids (FISH OIL) 1000 MG CAPS Take 1,000 mg by mouth 2 (two) times daily.    Yes Historical Provider, MD  ondansetron (ZOFRAN ODT) 4 MG disintegrating tablet Take 1 tablet (4 mg total) by mouth every 8 (eight) hours as needed for nausea. 11/08/15  Yes [name], MD  pantoprazole (PROTONIX) 40 MG tablet TAKE 1 TABLET (40 MG TOTAL) BY MOUTH DAILY. Patient taking differently: Take 40 mg by mouth daily.  16/1/09  Yes Delora Fuel, MD  prazosin (MINIPRESS) 2 MG capsule Take 2 mg by mouth at bedtime.  02/17/16  Yes Historical Provider, MD  Martin into the lungs at bedtime. CPAP   Yes Historical Provider, MD  Vilazodone HCl (VIIBRYD) 40 MG TABS Take 40 mg by mouth daily.    Yes Historical Provider, MD  vitamin B-12 (CYANOCOBALAMIN) 500 MCG tablet Take 500 mcg by mouth daily.   Yes Historical Provider, MD  benzonatate (TESSALON) 200 MG capsule Take 1 capsule (200 mg total) by mouth 3 (three) times daily as needed for cough. Patient not taking: Reported on 07/17/2016 04/13/16   [name], MD  ondansetron (ZOFRAN) 4 MG tablet Take 1 tablet (4 mg total) by mouth every 8 (eight) hours as needed  for nausea or vomiting. Patient not taking: Reported on 07/17/2016 56/3/14   Delora Fuel, MD    Scheduled Meds: . aspirin  81 mg Oral Daily  . atorvastatin  40 mg Oral q1800  . Chlorhexidine Gluconate Cloth  6 each Topical Q0600  . enoxaparin (LOVENOX) injection  40 mg Subcutaneous Q24H  . feeding supplement (ENSURE ENLIVE)  237 mL Oral BID BM  . folic acid  1 mg Oral Daily  . gi cocktail  30 mL Oral BID  . metoprolol tartrate  75 mg Oral BID  . mupirocin ointment  1 application Nasal BID  . pantoprazole  40 mg Oral BID  . potassium chloride  40 mEq Oral BID  . sodium chloride  1,000 mL Intravenous Once  . thiamine  100 mg Oral Daily  . Vilazodone HCl  40 mg Oral Daily   Infusions:  PRN Meds: acetaminophen, gi cocktail, nitroGLYCERIN, ondansetron (ZOFRAN) IV   Allergies as of 07/17/2016 - Review Complete 07/17/2016  Allergen Reaction Noted  . Depakote [divalproex sodium] Anaphylaxis 07/01/2012  . Diazepam Other (See Comments) 09/03/2015  . Valium Anaphylaxis 09/23/2010  . Tizanidine hcl Other (See Comments) 12/06/2015    Family History  Problem Relation Age of Onset  . Breast cancer Mother     bilateral; ages 24 and 83; TAH/BSO ~50  . Depression Sister   . Heart disease Father   . Hypertension Father   . Colon cancer Neg Hx   . Esophageal cancer Neg Hx   . Pancreatic cancer Neg Hx   . Rectal cancer Neg Hx   . Stomach cancer Neg Hx     Social History   Social History  . Marital status: Single    Spouse name: N/A  . Number of children: N/A  .  Years of education: N/A   Occupational History  . Not on file.   Social History Main Topics  . Smoking status: Never Smoker  . Smokeless tobacco: Never Used  . 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  . Drug use: No  . Sexual activity: Yes    Birth control/ protection: Surgical     Comment: 1st intercourse- 19, partners- 6   Other Topics Concern  . Not on file   Social History Narrative   Lives with 69 year old daughter.        REVIEW OF SYSTEMS: Constitutional:  No weakness.  Some 20 plus # weight loss after MI in 12/2015, but mood and appetite better with new anti-depressant ENT:  No nose bleeds.  Waiting on funding for new dentures, she lost her partial in summer 2017. Pulm:  SOB resolved.  Variable compliance with CPAP at home.  CV: per HPI.  No edema GU:  No hematuria, no frequency GI:  Per HPI.  No hematemesis or CGE.  Regular, brown stools.  Heme:  Bruises easily   Transfusions:  None per hr recall Neuro:  No headaches, no peripheral tingling or numbness Derm:  No itching, no rash or sores.  Endocrine:  No sweats or chills.  No polyuria or dysuria Immunization:  Not queried Travel:  None beyond local counties in last few months.    PHYSICAL EXAM: Vital signs in last 24 hours: Vitals:   07/19/16 0551 07/19/16 0917  BP: (!) 114/56 117/76  Pulse: 86 88  Resp:    Temp: 97.9 F (36.6 C)    Wt Readings from Last 3 Encounters:  07/19/16 58.9 kg (129 lb 12.8 oz)  05/11/16 59.1 kg (130 lb 3.2 oz)  04/13/16 56.2 kg (123 lb 12.8 oz)    General: pleasant, cooperative, comfortable.  Not ill looking Head:  No asymmetry or swelling, no trauma  Eyes:  No icterus or pallor Ears:  HOH.  Hearing aid on left  Nose:  No congestion or discharge Mouth:  Moist, clear, poor dentition, tongue midline Neck:  No mass or JVD.  No TMG Lungs:  Clear bil.  No dyspnea or cough Heart: RRR.  No mrg.  S1, s2 present.  External pacer in  place Abdomen:  Soft, NT, ND.  No mass, no HSM.  No hernia.  Active BS.   Rectal: deferred   Musc/Skeltl: no joint swelling.  Deformity of right wrist.   Extremities:  No CCE  Neurologic:  Pleasant, alert and oriented x 3.   Skin:  No rash, no sores Tattoos:  none Nodes:  No cervical adenopathy   Psych:  Pleasant, cooperative, calm.   LAB RESULTS:  Recent Labs  07/17/16 1153 07/18/16 0728 07/19/16 0248  WBC 3.7* 2.5* 2.0*  HGB 12.4 11.3* 10.6*  HCT 36.2 35.0* 31.8*  PLT 257 174 142*   BMET Lab Results  Component Value Date   NA 136 07/19/2016   NA 136 07/18/2016   NA 138 07/17/2016   K 3.3 (L) 07/19/2016   K 3.8 07/18/2016   K 3.7 07/17/2016   CL 100 (L) 07/19/2016   CL 100 (L) 07/18/2016   CL 99 (L) 07/17/2016   CO2 24 07/19/2016   CO2 25 07/18/2016   CO2 23 07/17/2016   GLUCOSE 94 07/19/2016   GLUCOSE 82 07/18/2016   GLUCOSE 87 07/17/2016   BUN 10 07/19/2016   BUN 13 07/18/2016   BUN 9 07/17/2016   CREATININE 0.94 07/19/2016   CREATININE 1.03 (H) 07/18/2016   CREATININE 0.88 07/17/2016   CALCIUM 7.7 (L) 07/19/2016   CALCIUM 7.8 (L) 07/18/2016   CALCIUM 8.7 (L) 07/17/2016   LFT  Recent Labs  07/17/16 1153  PROT 6.3*  ALBUMIN 3.7  AST 113*  ALT 36  ALKPHOS 77  BILITOT 0.8   Lipase     Component Value Date/Time   LIPASE 66 (H) 07/17/2016 1153    Drugs of Abuse     Component Value Date/Time   LABOPIA NONE DETECTED 07/17/2016 1306   COCAINSCRNUR NONE DETECTED 07/17/2016 1306   COCAINSCRNUR NEGATIVE 04/24/2008 1430   LABBENZ NONE DETECTED 07/17/2016 1306   LABBENZ NEGATIVE 04/24/2008 1430   AMPHETMU NONE DETECTED 07/17/2016 1306   THCU NONE DETECTED 07/17/2016 1306   LABBARB NONE DETECTED 07/17/2016 1306       IMPRESSION:   *  Dysphagia.   Candida esophagitis, HH, gastritis on previous EGD x 2.  *  Macrocytic anemia.   *  Hypokalemia.  Hypomagnesemia.    *  Elevated LFTs and mild elevation Lipase. Elevated blood ETOH level.  Fatty liver and ? Cirrhosis per CT 03/2016.  Pt says has drunk no ETOH for several months.   *  Thrombocytopenia. Intermittent.  Dates to 09/2013.       PLAN:     *  EGD ~ noon tomorrow.  Stop Lovenox now so we can safely dilate esophagus if needed.   *  Potassium level in AM.     Azucena Freed  07/19/2016, 2:11 PM Pager: [place] Attending   I have taken an interval history, reviewed the  chart and examined the patient. I agree with the Advanced Practitioner's note, impression and recommendations.   Somewhat odd situation with recurrent dysphagia ? Pharyngeal/esophageal or both with recurrent Candida and ? Motility changes - suspected narrowed esophagus and proximal diverticulum on Ba swallow 2015 (images viewed) Will evaluate with EGD tomorrow, possible dilation tough if Candida not - and may need MBS  Gatha Mayer, MD, [facility] [phone] (pager) [phone] after 5 PM, weekends and holidays  07/19/2016 3:58 PM

## 2016-07-20 NOTE — Anesthesia Procedure Notes (Signed)
Procedure Name: MAC Date/Time: 07/20/2016 1:23 PM Performed by: Kyung Rudd Pre-anesthesia Checklist: Patient identified, Emergency Drugs available, Suction available and Patient being monitored Patient Re-evaluated:Patient Re-evaluated prior to inductionOxygen Delivery Method: Nasal cannula Intubation Type: IV induction Placement Confirmation: positive ETCO2

## 2016-07-21 ENCOUNTER — Encounter (HOSPITAL_COMMUNITY): Payer: Self-pay | Admitting: Internal Medicine

## 2016-07-21 DIAGNOSIS — D61818 Other pancytopenia: Secondary | ICD-10-CM

## 2016-07-21 LAB — BASIC METABOLIC PANEL WITH GFR
Anion gap: 4 — ABNORMAL LOW (ref 5–15)
BUN: <5 mg/dL — ABNORMAL LOW (ref 6–20)
CO2: 26 mmol/L (ref 22–32)
Calcium: 8.4 mg/dL — ABNORMAL LOW (ref 8.9–10.3)
Chloride: 107 mmol/L (ref 101–111)
Creatinine, Ser: 1.09 mg/dL — ABNORMAL HIGH (ref 0.44–1.00)
GFR calc Af Amer: >60 mL/min
GFR calc non Af Amer: 56 mL/min — ABNORMAL LOW
Glucose, Bld: 99 mg/dL (ref 65–99)
Potassium: 4.2 mmol/L (ref 3.5–5.1)
Sodium: 137 mmol/L (ref 135–145)

## 2016-07-21 LAB — CBC
HCT: 33.6 % — ABNORMAL LOW (ref 36.0–46.0)
HEMOGLOBIN: 11 g/dL — AB (ref 12.0–15.0)
MCH: 35.8 pg — ABNORMAL HIGH (ref 26.0–34.0)
MCHC: 32.7 g/dL (ref 30.0–36.0)
MCV: 109.4 fL — ABNORMAL HIGH (ref 78.0–100.0)
Platelets: 114 10*3/uL — ABNORMAL LOW (ref 150–400)
RBC: 3.07 MIL/uL — AB (ref 3.87–5.11)
RDW: 16.2 % — ABNORMAL HIGH (ref 11.5–15.5)
WBC: 2.3 10*3/uL — AB (ref 4.0–10.5)

## 2016-07-21 LAB — PROTIME-INR
INR: 0.97
Prothrombin Time: 12.9 s (ref 11.4–15.2)

## 2016-07-21 LAB — PATHOLOGIST SMEAR REVIEW: PATH REVIEW: NORMAL

## 2016-07-21 MED ORDER — FLUCONAZOLE 100 MG PO TABS: 100.0000 mg | ORAL_TABLET | Freq: Every day | ORAL | 0 refills | Status: DC

## 2016-07-21 NOTE — Anesthesia Postprocedure Evaluation (Signed)
Anesthesia Post Note  Patient: [name]  Procedure(s) Performed: Procedure(s) (LRB): ESOPHAGOGASTRODUODENOSCOPY (EGD) WITH PROPOFOL (N/A)  Patient location during evaluation: PACU Anesthesia Type: MAC Level of consciousness: awake Pain management: pain level controlled Respiratory status: spontaneous breathing Cardiovascular status: stable Anesthetic complications: no       Last Vitals:  Vitals:   07/21/16 0447 07/21/16 0828  BP: 127/89   Pulse: 90 85  Resp: (!) 25   Temp: 36.4 C     Last Pain:  Vitals:   07/21/16 1319  TempSrc:   PainSc: 0-No pain                 [name]

## 2016-07-21 NOTE — Care Management Important Message (Signed)
Important Message  Patient Details  Name: [name] MRN: [id number] Date of Birth: Sep 20, 1960   Medicare Important Message Given:  Yes    Bethena Roys, RN 07/21/2016, 12:21 PM

## 2016-07-21 NOTE — Progress Notes (Signed)
Family Medicine Teaching Service Daily Progress Note Intern Pager: [phone]  Patient name: [name] Medical record number: [id number] Date of birth: 03-Mar-1961 Age: 56 y.o. Gender: female  Primary Care Provider: [name], MD Consultants: Cardiology Code Status: Full  Pt Overview and Major Events to Date:  3/10: Admitted to FMTS for ACS rule out   Assessment and Plan: [name] is a 56 y.o. female presenting with squeezing abdominal pain along with nausea and vomiting. PMH is significant for alcohol abuse, major depressive disorder, NSTEMI with cardiac arrest, GERD, recurrent aspiration, hypertension, dysphagia, hyperlipidemia   Atypical Chest pain, resolved:  ACS has been ruled out with negative troponins and EKG findings.  Cardiology consulted and noted likely pill esophagitis.  Will continue Protonix, may need backup bradycardic pacing for pause on tele. Appreciate cardiology recs.  Patient reports no longer has chest pain and it has not recurred since admission.  GI consult placed 3/12 and they did EGD yesterday.  EGD>> diffuse candidiasis in upper and middle third of esophagus, obstructing Schatzki ring requiring dilatation.  Started on Diflucan.  Per GI recs started on a clear liquid diet>>soft diet, f/u with Dr. Ardis Hughs in 1 month.  - Nitroglycerin 0.4 mg every 5 minutes PRN chest pain   - scheduled GI cocktails BID and prn as well  - Protonix BID - Tylenol PRN pain - Vitals per floor protocol   Epigastric abdominal pain with nausea and vomiting, improved:  History of GERD and takes Protonix daily.  No vomiting since admission.  Reports abdominal pain has improved.  EGD on 3/14.   - continue GI cocktail and Protonix   - Zofran prn for nausea  - Tylenol prn  Hypokalemia  4.2 this AM.  -Daily BMET   Elevated TSH, resolved.  TSH 8.0.  Repeat TSH 2.9 with normal T4, low T3 of 1.9.  No further interventions required.   Urinary tract infection, resolved: Patient  asymptomatic, CTX discontinued 3/12 as coag negative staph,  likely contaminant. No antibiotics needed.   Dysphagia: Has been seen by [facility] in the past, who thought intermittent dysphagia is 2/2 recurrent esophageal candidiasis. HIV neg.   - Follow up with Dr. Ardis Hughs in 1 month.   Hypotension with hx of hypertension, resolved:  Has been normotensive over past 2 days. Takes metoprolol tartrate 75 mg twice a day and prazosin 2 mg daily.  - Continue to hold home meds   GERD: Takes 40 mg Protonix daily -Protonix BID in setting of increased symptoms   History of alcohol abuse: Quit a few months ago. UDS neg. Alcohol level on admission 6. - CIWA protocol. Scores have remained 0s.  -will stop monitoring CIWA as scores have remained zeros overnight.   Hearing impairment:  Reported to have hearing impairment secondary to recurrent ear infections. Patient indicates by reading lips.  - Hearing aid in place  Major depressive disorder/generalized anxiety disorder: Patient takes volazodone 40 mg daily - continue volazodone 40 mg daily  Hyperlipidemia: Takes atorvastatin 40 mg daily. -Continue atorvastatin  Hx of cardiac arrest and cardiac surgery: Hx of 6 minute cardiac arrest after respiratory failure after choking on a pill, was found to have NSTEMI afterwards; this was all in August 2017. S/p ASD and MV repair as well. Had nuclear stress test in 9/17 which was negative. Last saw cardiologist in Oct 2017.  - See plan above.   Leukopenia: WBC 3.7>2.5>2.0>1.7>2.3.  HIV negative.   -CBC w/ diff ordered with AM labs. ANC low  0.8. History of leukopenia since 2017 and with new Candida.  Peripheral smear reviewed with Dr. Beryle Beams (Internal Medicine) and suspect likely secondary to chronic alcoholism.  Consider outpatient follow up with heme/onc if worsening.     High MCV -MCV of 103.  Ruled out hypothyroidism.   Retics low 3.10.  Vit B12 and folate wnl.  TIBC low 239, Iron 37 wnl,  ferritin 221 normal, possibly suggestive of anemia of chronic disease.    FEN/GI: soft diet  Prophylaxis: Lovenox   Disposition: Discharge home today.   Subjective:  Denies chest pain and abdominal pain.  Has been eating without nausea/vomiting.  No concerns at this time and would like to know when she can go home.    Objective: Temp:  [97.5 F (36.4 C)-98.4 F (36.9 C)] 97.5 F (36.4 C) (03/14 0447) Pulse Rate:  [75-90] 85 (03/14 0828) Resp:  [10-29] 25 (03/14 0447) BP: (92-127)/(64-89) 127/89 (03/14 0447) SpO2:  [99 %-100 %] 99 % (03/14 0447) Weight:  [128 lb 9.6 oz (58.3 kg)] 128 lb 9.6 oz (58.3 kg) (03/14 0447)   Physical Exam: General: Sitting up in bed, in NA, hearing aid in place HEENT: EOMI, MMM  Neck:  Supple, no LAD  Cardiovascular: RRR, no MRG  Respiratory:  CTA B/L with comfortable work of breathing  Gastrointestinal: +BS, soft  MSK: No edema  Derm: warm and dry  Neuro: AAOx3, no focal deficits Psych: Normal mood and affect   Laboratory:  Recent Labs Lab 07/19/16 0248 07/20/16 0307 07/21/16 0306  WBC 2.0* 1.7* 2.3*  HGB 10.6* 10.4* 11.0*  HCT 31.8* 31.5* 33.6*  PLT 142* 121* 114*    Recent Labs Lab 07/17/16 1153  07/19/16 0248 07/20/16 0307 07/21/16 0306  NA 138  < > 136 141 137  K 3.7  < > 3.3* 4.4 4.2  CL 99*  < > 100* 107 107  CO2 23  < > 24 27 26   BUN 9  < > 10 6 <5*  CREATININE 0.88  < > 0.94 0.87 1.09*  CALCIUM 8.7*  < > 7.7* 8.3* 8.4*  PROT 6.3*  --   --   --   --   BILITOT 0.8  --   --   --   --   ALKPHOS 77  --   --   --   --   ALT 36  --   --   --   --   AST 113*  --   --   --   --   GLUCOSE 87  < > 94 91 99  < > = values in this interval not displayed.  Imaging/Diagnostic Tests: CXR 3/10: Neg  Lovenia Kim, MD 07/21/2016, 11:51 AM PGY-1, [place] Intern pager: [phone], text pages welcome

## 2016-07-21 NOTE — Progress Notes (Signed)
Subjective:    Patient ID: [name], female    DOB: [birth year], 56 y.o.   MRN: [id number] Chief Complaint  Patient presents with  . Follow-up     Hospital f/u    HPI  [name] is a 56 yo woman with numerous chornic medical conditions who is here to follow-up from her hospitalized from 3/10-3/14 (so was discharged yesterday).  The day prior to admission pt experienced pill dysphagia with her metoprolol. This was terrifying as it had happened 12/2015 during a hospitalization resulting in respiratory arrest then cardiac arrest requiring several minutes of CPR.  On 3/10 her metoprolol became stuck in her esophagus causing persistent chest then epigastric pain followed by nausea/vomiting. Cont on ppi - increased to bid and [facility] did EGD with dilation 3/13 then was started on fluconazole qd x 2 wks due to h/o recurrent esophageal candidiasis and dilation of obstructing Schatski's ring. Has f/u on 5/11 with Dr. Ardis Hughs for repeat EGD. Had SLP swallow eval while inpt. Has dropped the ppi back down to qd - didn't know if she was supposed to be on bid  Chest pain resolved but pt did have EKG changes of new T wave in[place].  She was symptomatically hypotensive and also had a symptomatic significant pause on telemetry the first night of her hospitalization so cardiology was consulted. Had NSTEMI and 6 min cardiac arrest after chokingon a pill while hosp 12/2015 so had nuclear stress test 01/2016 that was normal. S/p remote ASD and MV repair. Bradycardia and pauses attributed to vomiting - hyper-vagotonia.  HTN: was symptomatically hypotensive in hosp so metoprolol and prazosin where held  HLD: On atorvastatin 40 qd  Depression: volazodone 40mg  qd  H/o recurrent EtOH abuse: Pt had stated she had stopped drinking etoh for sev mos (states was prev drinking 1 pint/d vodka) but on hosp admission 07/17/16 pt had elevated blood EtOH level on 6 w/ LFTs and mild lipase elevation.  Possibility of cirrhosis seen  on CT scan 03/2016. Seen by Silvestre [facility]i at [facility] today and it went well.  She was depressed.   Chronic pancytopenia: with low ANC. suspected to be due to chronic alcoholism but needs hematology referral to ensure if at all worsening. Macrocytic Anemia w/o nml (b12, folate, normal (low) retics, tsh, iron and ferritin nml, tibc low so suspected anemia of chronic disease.  Hearing impaired: reads lips  Sleep apnea - wears cpap but she doesn't like it. Last mask fitting was sev years ago.   Malnutrition; moderate - due to chronic illness. Saw dietician in [place] who rec protein supp shakes. Recurrent hypokalemia and low Mg due to EtOH abuase.  had to do an IV in h er foot.  She is eating well - made eggs this morning, drinking water. And did sesame chicken with veggies and rice for lunch  Past Medical History:  Diagnosis Date  . Allergy    SEASONAL  . Anemia   . Anxiety   . Arthritis   . ASD (atrial septal defect)    a. s/p ASD repair in 1973  . CAD (coronary artery disease)    a. 12/2015: NSTEMI occurring after cardiac arrest secondary to aspiration PNA. Echo w/ EF of 55-60%, no WMA. Outpt ischemic eval needed.  . Cardiomyopathy   . Deaf   . Depression   . Dysphagia    due to esophageal infections.  . Fibromyalgia   . Gastritis   . GERD (gastroesophageal reflux disease)   .  Hearing impairment   . Hypertension    [name], take htn medication to regulate heart beat.  . Migraine   . Osteoporosis   . Pulmonary embolism ([place])    occured post c-section of her daughter  . Sleep apnea   . Vitamin D deficiency    Past Surgical History:  Procedure Laterality Date  . APPENDECTOMY    . CARDIAC SURGERY  January 2007   mitral valve repair  . CARDIAC SURGERY  1973   atrial septal defect  . CARDIAC SURGERY    . COCHLEAR IMPLANT    . ESOPHAGOGASTRODUODENOSCOPY (EGD) WITH PROPOFOL N/A 07/20/2016   Procedure: ESOPHAGOGASTRODUODENOSCOPY (EGD) WITH PROPOFOL;  Surgeon: Gatha Mayer, MD;   Location: [facility];  Service: Endoscopy;  Laterality: N/A;  . INNER EAR SURGERY     TUBES  . LAPAROSCOPIC CHOLECYSTECTOMY  2012  . TONSILLECTOMY AND ADENOIDECTOMY    . TUBAL LIGATION     Current Outpatient Prescriptions on File Prior to Visit  Medication Sig Dispense Refill  . aspirin 81 MG chewable tablet Chew 81 mg by mouth daily.    [name] atorvastatin (LIPITOR) 40 MG tablet Take 1 tablet (40 mg total) by mouth daily at 6 PM. 90 tablet 1  . Cholecalciferol (VITAMIN D3) 2000 units TABS Take 2,000 Units by mouth daily.     . fexofenadine (ALLEGRA ALLERGY) 180 MG tablet Take 180 mg by mouth daily.    . fluconazole (DIFLUCAN) 100 MG tablet Take 1 tablet (100 mg total) by mouth daily. 20 tablet 0  . fluticasone (FLONASE) 50 MCG/ACT nasal spray Place 2 sprays into both nostrils daily.    . folic acid (FOLVITE) 1 MG tablet Take 1 mg by mouth daily.    . nitroGLYCERIN (NITROSTAT) 0.4 MG SL tablet Place 1 tablet (0.4 mg total) under the tongue every 5 (five) minutes as needed for chest pain. 30 tablet 0  . Omega-3 Fatty Acids (FISH OIL) 1000 MG CAPS Take 1,000 mg by mouth 2 (two) times daily.     . ondansetron (ZOFRAN ODT) 4 MG disintegrating tablet Take 1 tablet (4 mg total) by mouth every 8 (eight) hours as needed for nausea. 6 tablet 0  . prazosin (MINIPRESS) 2 MG capsule Take 2 mg by mouth at bedtime.     [name] PRESCRIPTION MEDICATION Inhale into the lungs at bedtime. CPAP    . Vilazodone HCl (VIIBRYD) 40 MG TABS Take 40 mg by mouth daily.    . vitamin B-12 (CYANOCOBALAMIN) 500 MCG tablet Take 500 mcg by mouth daily.     No current facility-administered medications on file prior to visit.    Allergies  Allergen Reactions  . Depakote [Divalproex Sodium] Anaphylaxis  . Diazepam Other (See Comments)    Died on operating table  . Valium Anaphylaxis  . Tizanidine Hcl Other (See Comments)    "passed out"   Family History  Problem Relation Age of Onset  . Breast cancer Mother     bilateral;  ages 61 and 73; TAH/BSO ~50  . Depression Sister   . Heart disease Father   . Hypertension Father   . Colon cancer Neg Hx   . Esophageal cancer Neg Hx   . Pancreatic cancer Neg Hx   . Rectal cancer Neg Hx   . Stomach cancer Neg Hx    Social History   Social History  . Marital status: Single    Spouse name: N/A  . Number of children: N/A  . Years of education: N/A  Social History Main Topics  . Smoking status: Never Smoker  . Smokeless tobacco: Never Used  . 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  . Drug use: No  . Sexual activity: Yes    Birth control/ protection: Surgical     Comment: 1st intercourse- 19, partners- 6   Other Topics Concern  . None   Social History Narrative   Lives with 70 year old daughter.       Depression screen [facility] 2/9 07/22/2016 05/11/2016 04/13/2016 03/17/2016 01/26/2016  Decreased Interest 0 0 0 1 0  Down, Depressed, Hopeless 1 0 0 1 3  PHQ - 2 Score 1 0 0 2 3  Altered sleeping - - - 0 3  Tired, decreased energy - - - 3 3  Change in appetite - - - 0 2  Feeling bad or failure about yourself  - - - 1 3  Trouble concentrating - - - 1 3  Moving slowly or fidgety/restless - - - 1 1  Suicidal thoughts - - - 0 0  PHQ-9 Score - - - 8 18  Difficult doing work/chores - - - Somewhat difficult Very difficult    Review of Systems See hpi    Objective:   Physical Exam  Constitutional: She is oriented to person, place, and time. She appears well-developed and well-nourished. No distress.  HENT:  Head: Normocephalic and atraumatic.  Neck: Normal range of motion. Neck supple. No thyromegaly present.  Cardiovascular: Normal rate, regular rhythm, normal heart sounds and intact distal pulses.   Pulmonary/Chest: Effort normal and breath sounds normal. No respiratory distress.  Abdominal: Soft. Bowel sounds are normal. There is tenderness. There is no rebound, no guarding and no CVA tenderness.    Musculoskeletal: She exhibits no edema.  Lymphadenopathy:    She has no cervical adenopathy.  Neurological: She is alert and oriented to person, place, and time.  Skin: Skin is warm and dry. She is not diaphoretic. No erythema.  Psychiatric: She has a normal mood and affect. Her behavior is normal.       BP 97/72 (BP Location: Right Arm, Patient Position: Sitting, Cuff Size: Small)   Pulse (!) 108   Temp 98.9 F (37.2 C) (Oral)   Resp 18   Ht 5\' 3"  (1.6 m)   Wt 132 lb (59.9 kg)   SpO2 99%   BMI 23.38 kg/m      Results for orders placed or performed in visit on 07/22/16  POCT urinalysis dipstick  Result Value Ref Range   Color, UA yellow yellow   Clarity, UA clear clear   Glucose, UA negative negative   Bilirubin, UA small (A) negative   Ketones, POC UA trace (5) (A) negative   Spec Grav, UA 1.020 1.030 - 1.035   Blood, UA trace-lysed (A) negative   pH, UA 5.5 5.0 - 8.0   Protein Ur, POC trace (A) negative   Urobilinogen, UA 0.2 Negative - 2.0   Nitrite, UA Negative Negative   Leukocytes, UA Negative Negative   Orthostatic VS negative. Assessment & Plan:  Needs GI eval for poss cirrhosis (vs fatty liver) see on CT  Scan 03/2016 Cbc, cmp, lipase, ua, peripheral smear- [facility] wasn't able to draw today but pt wwants to really push fluids o/n.   - Cont fluconazole x 2 wks, bid ppi, no nsaids Decrease metoprfolol form 1.5 bid to 0.5 bid.  Push fliuds - suspect vitals due  to dehydraiton. Recheck vitals and draw labs at nurse only visit omotrorow after pushing fluids o/n.  1. Pancytopenia ([place])   2. Moderate malnutrition ([place])   3. Alcohol abuse   4. Bradycardia   5. Chest pain, unspecified type   6. Transaminitis   7. Candida infection, esophageal ([place])   8. Esophageal ring, acquired   9. Essential hypertension   10. NSTEMI (non-ST elevated myocardial infarction) ([place])   11. Orthostatic hypotension     Orders Placed This Encounter  Procedures  . Orthostatic  vital signs  . Care order/instruction:    Scheduling Instructions:     Complete orders, AVS and go.  [name] POCT urinalysis dipstick    Meds ordered this encounter  Medications  . pantoprazole (PROTONIX) 40 MG tablet    Sig: Take 1 tablet (40 mg total) by mouth 2 (two) times daily before a meal.    Dispense:  60 tablet    Refill:  1  . metoprolol tartrate (LOPRESSOR) 25 MG tablet    Sig: Take 1 tablet (25 mg total) by mouth 2 (two) times daily.    Dispense:  180 tablet    Refill:  3  . Nutritional Supplements (HIGH-PROTEIN NUTRITIONAL SHAKE) LIQD    Sig: Drink between meals three times a day. Ok to use brand and flavor of pt's choice. No ingredient restrictions.    Dispense:  90 Bottle    Refill:  2      [name], M.D.  Primary Care at [facility] [address] [place], [place] [address] [phone] phone [phone] fax  07/23/16 8:59 PM

## 2016-07-21 NOTE — Progress Notes (Signed)
          Daily Rounding Note  07/21/2016, 9:36 AM  LOS: 3 days   SUBJECTIVE:   Chief complaint: dysphagia.  This is better this AM.  Breakfast of homefries and scrambled eggs.  No complaints.      OBJECTIVE:         Vital signs in last 24 hours:    Temp:  [97.5 F (36.4 C)-98.4 F (36.9 C)] 97.5 F (36.4 C) (03/14 0447) Pulse Rate:  [75-90] 85 (03/14 0828) Resp:  [10-29] 25 (03/14 0447) BP: (92-127)/(64-89) 127/89 (03/14 0447) SpO2:  [99 %-100 %] 99 % (03/14 0447) Weight:  [58.3 kg (128 lb 9.6 oz)] 58.3 kg (128 lb 9.6 oz) (03/14 0447) Last BM Date: 07/19/16 Filed Weights   07/19/16 0300 07/20/16 0442 07/21/16 0447  Weight: 58.9 kg (129 lb 12.8 oz) 58.4 kg (128 lb 12.8 oz) 58.3 kg (128 lb 9.6 oz)   General: pleasant, HOH.  Comfortable, not ill looking   Heart: RRR Chest: clear bil.  No cough or dyspnea Abdomen: soft, NT, ND.  BS active  Extremities: no CCE Neuro/Psych:  Oriented x 3, fully alert, calm, cooperative.  HOH.    Intake/Output from previous day: 03/13 0701 - 03/14 0700 In: 1450 [P.O.:1050; I.V.:400] Out: -   Intake/Output this shift: Total I/O In: 240 [P.O.:240] Out: -   Lab Results:  Recent Labs  07/19/16 0248 07/20/16 0307 07/21/16 0306  WBC 2.0* 1.7* 2.3*  HGB 10.6* 10.4* 11.0*  HCT 31.8* 31.5* 33.6*  PLT 142* 121* 114*   BMET  Recent Labs  07/19/16 0248 07/20/16 0307 07/21/16 0306  NA 136 141 137  K 3.3* 4.4 4.2  CL 100* 107 107  CO2 24 27 26   GLUCOSE 94 91 99  BUN 10 6 <5*  CREATININE 0.94 0.87 1.09*  CALCIUM 7.7* 8.3* 8.4*     ASSESMENT:   *  Dysphagia, recurrent. Monilial (candidial) esophagitis as was present on 2 previous EGDs, s/p dilation of obstructing Schatski's ring.   HH.  Fluconazole started 3/13.     *  Elevated LFTs and mild elevation Lipase. Elevated blood ETOH level. Fatty liver and ? Cirrhosis per CT 03/2016.  Pt says has drunk no ETOH for several months    * due repeat colonoscopy 10/2017. Non-retrieved, 27mm colon polyp in 10/2013.     PLAN   *  ROV with GI Dr Ardis Hughs 09/17/16 and reassess then to see if repeat EGD/dilation needed.   *  Complete 2 weeks diflucan.      Azucena Freed  07/21/2016, 9:36 AM Pager: [phone]  Agree with Ms. Sandi Carne assessment and plan. Gatha Mayer, MD, [facility]

## 2016-07-21 NOTE — Discharge Instructions (Signed)
You were admitted to the hospital for chest pain and difficulty swallowing.  Cardiac causes were ruled out.  A GI specialist saw you and recommended the endoscopy.  You were found to have a yeast infection in your throat and were started on an antifungal medication.   Please continue to take the Diflucan for a total of 3 weeks.  I have sent this prescription to your pharmacy.    Follow up with GI office in 1 month.      Nonspecific Chest Pain Chest pain can be caused by many different conditions. There is always a chance that your pain could be related to something serious, such as a heart attack or a blood clot in your lungs. Chest pain can also be caused by conditions that are not life-threatening. If you have chest pain, it is very important to follow up with your health care provider. What are the causes? Causes of this condition include:  Heartburn.  Pneumonia or bronchitis.  Anxiety or stress.  Inflammation around your heart (pericarditis) or lung (pleuritis or pleurisy).  A blood clot in your lung.  A collapsed lung (pneumothorax). This can develop suddenly on its own (spontaneous pneumothorax) or from trauma to the chest.  Shingles infection (varicella-zoster virus).  Heart attack.  Damage to the bones, muscles, and cartilage that make up your chest wall. This can include:  Bruised bones due to injury.  Strained muscles or cartilage due to frequent or repeated coughing or overwork.  Fracture to one or more ribs.  Sore cartilage due to inflammation (costochondritis). What increases the risk? Risk factors for this condition may include:  Activities that increase your risk for trauma or injury to your chest.  Respiratory infections or conditions that cause frequent coughing.  Medical conditions or overeating that can cause heartburn.  Heart disease or family history of heart disease.  Conditions or health behaviors that increase your risk of developing a blood  clot.  Having had chicken pox (varicella zoster). What are the signs or symptoms? Chest pain can feel like:  Burning or tingling on the surface of your chest or deep in your chest.  Crushing, pressure, aching, or squeezing pain.  Dull or sharp pain that is worse when you move, cough, or take a deep breath.  Pain that is also felt in your back, neck, shoulder, or arm, or pain that spreads to any of these areas. Your chest pain may come and go, or it may stay constant. How is this diagnosed? Lab tests or other studies may be needed to find the cause of your pain. Your health care provider may have you take a test called an ECG (electrocardiogram). An ECG records your heartbeat patterns at the time the test is performed. You may also have other tests, such as:  Transthoracic echocardiogram (TTE). In this test, sound waves are used to create a picture of the heart structures and to look at how blood flows through your heart.  Transesophageal echocardiogram (TEE).This is a more advanced imaging test that takes images from inside your body. It allows your health care provider to see your heart in finer detail.  Cardiac monitoring. This allows your health care provider to monitor your heart rate and rhythm in real time.  Holter monitor. This is a portable device that records your heartbeat and can help to diagnose abnormal heartbeats. It allows your health care provider to track your heart activity for several days, if needed.  Stress tests. These can be done through  exercise or by taking medicine that makes your heart beat more quickly.  Blood tests.  Other imaging tests. How is this treated? Treatment depends on what is causing your chest pain. Treatment may include:  Medicines. These may include:  Acid blockers for heartburn.  Anti-inflammatory medicine.  Pain medicine for inflammatory conditions.  Antibiotic medicine, if an infection is present.  Medicines to dissolve blood  clots.  Medicines to treat coronary artery disease (CAD).  Supportive care for conditions that do not require medicines. This may include:  Resting.  Applying heat or cold packs to injured areas.  Limiting activities until pain decreases. Follow these instructions at home: Medicines   If you were prescribed an antibiotic, take it as told by your health care provider. Do not stop taking the antibiotic even if you start to feel better.  Take over-the-counter and prescription medicines only as told by your health care provider. Lifestyle   Do not use any products that contain nicotine or tobacco, such as cigarettes and e-cigarettes. If you need help quitting, ask your health care provider.  Do not drink alcohol.  Make lifestyle changes as directed by your health care provider. These may include:  Getting regular exercise. Ask your health care provider to suggest some activities that are safe for you.  Eating a heart-healthy diet. A registered dietitian can help you to learn healthy eating options.  Maintaining a healthy weight.  Managing diabetes, if necessary.  Reducing stress, such as with yoga or relaxation techniques. General instructions   Avoid any activities that bring on chest pain.  If heartburn is the cause for your chest pain, raise (elevate) the head of your bed about 6 inches (15 cm) by putting blocks under the legs. Sleeping with more pillows does not effectively relieve heartburn because it only changes the position of your head.  Keep all follow-up visits as told by your health care provider. This is important. This includes any further testing if your chest pain does not go away. Contact a health care provider if:  Your chest pain does not go away.  You have a rash with blisters on your chest.  You have a fever.  You have chills. Get help right away if:  Your chest pain is worse.  You have a cough that gets worse, or you cough up blood.  You have  severe pain in your abdomen.  You have severe weakness.  You faint.  You have sudden, unexplained chest discomfort.  You have sudden, unexplained discomfort in your arms, back, neck, or jaw.  You have shortness of breath at any time.  You suddenly start to sweat, or your skin gets clammy.  You feel nauseous or you vomit.  You suddenly feel light-headed or dizzy.  Your heart begins to beat quickly, or it feels like it is skipping beats. These symptoms may represent a serious problem that is an emergency. Do not wait to see if the symptoms will go away. Get medical help right away. Call your local emergency services (911 in the [place] Do not drive yourself to the hospital. This information is not intended to replace advice given to you by your health care provider. Make sure you discuss any questions you have with your health care provider. Document Released: 02/03/2005 Document Revised: 01/19/2016 Document Reviewed: 01/19/2016 Elsevier Interactive Patient Education  2017 [employer].

## 2016-07-22 ENCOUNTER — Ambulatory Visit (INDEPENDENT_AMBULATORY_CARE_PROVIDER_SITE_OTHER): Payer: Medicare Other | Admitting: Family Medicine

## 2016-07-22 ENCOUNTER — Encounter: Payer: Self-pay | Admitting: Family Medicine

## 2016-07-22 VITALS — BP 97/72 | HR 108 | Temp 98.9°F | Resp 18 | Ht 63.0 in | Wt 132.0 lb

## 2016-07-22 DIAGNOSIS — I214 Non-ST elevation (NSTEMI) myocardial infarction: Secondary | ICD-10-CM | POA: Diagnosis not present

## 2016-07-22 DIAGNOSIS — B3781 Candidal esophagitis: Secondary | ICD-10-CM

## 2016-07-22 DIAGNOSIS — I10 Essential (primary) hypertension: Secondary | ICD-10-CM | POA: Diagnosis not present

## 2016-07-22 DIAGNOSIS — K222 Esophageal obstruction: Secondary | ICD-10-CM

## 2016-07-22 DIAGNOSIS — E440 Moderate protein-calorie malnutrition: Secondary | ICD-10-CM | POA: Diagnosis not present

## 2016-07-22 DIAGNOSIS — R740 Nonspecific elevation of levels of transaminase and lactic acid dehydrogenase [LDH]: Secondary | ICD-10-CM

## 2016-07-22 DIAGNOSIS — R7401 Elevation of levels of liver transaminase levels: Secondary | ICD-10-CM

## 2016-07-22 DIAGNOSIS — F1010 Alcohol abuse, uncomplicated: Secondary | ICD-10-CM

## 2016-07-22 DIAGNOSIS — I951 Orthostatic hypotension: Secondary | ICD-10-CM

## 2016-07-22 DIAGNOSIS — R079 Chest pain, unspecified: Secondary | ICD-10-CM | POA: Diagnosis not present

## 2016-07-22 DIAGNOSIS — R001 Bradycardia, unspecified: Secondary | ICD-10-CM | POA: Diagnosis not present

## 2016-07-22 DIAGNOSIS — D61818 Other pancytopenia: Secondary | ICD-10-CM

## 2016-07-22 LAB — POCT URINALYSIS DIP (MANUAL ENTRY)
GLUCOSE UA: NEGATIVE
Leukocytes, UA: NEGATIVE
Nitrite, UA: NEGATIVE
SPEC GRAV UA: 1.02 (ref 1.030–1.035)
UROBILINOGEN UA: 0.2 (ref ?–2.0)
pH, UA: 5.5 (ref 5.0–8.0)

## 2016-07-22 MED ORDER — METOPROLOL TARTRATE 25 MG PO TABS: 25.0000 mg | ORAL_TABLET | Freq: Two times a day (BID) | ORAL | 3 refills | Status: DC

## 2016-07-22 MED ORDER — HIGH-PROTEIN NUTRITIONAL SHAKE PO LIQD: ORAL | 2 refills | Status: DC

## 2016-07-22 MED ORDER — PANTOPRAZOLE SODIUM 40 MG PO TBEC: 40.0000 mg | DELAYED_RELEASE_TABLET | Freq: Two times a day (BID) | ORAL | 1 refills | Status: DC

## 2016-07-22 NOTE — Patient Instructions (Addendum)
Return to clinic tomorrow to have your blood work drawn. If you have not been able to drink a lot of water overnight in order to get your urine light yellow or clear, then instead of coming back tomorrow please make an appointment to see me on Saturday instead     IF you received an x-ray today, you will receive an invoice from [facility]. Please contact [facility] at [phone] with questions or concerns regarding your invoice.   IF you received labwork today, you will receive an invoice from [facility]. Please contact [facility] at [phone] with questions or concerns regarding your invoice.   Our billing staff will not be able to assist you with questions regarding bills from these companies.  You will be contacted with the lab results as soon as they are available. The fastest way to get your results is to activate your My Chart account. Instructions are located on the last page of this paperwork. If you have not heard from [place] regarding the results in 2 weeks, please contact this office.

## 2016-07-23 ENCOUNTER — Ambulatory Visit: Payer: Medicare Other

## 2016-07-23 DIAGNOSIS — M810 Age-related osteoporosis without current pathological fracture: Secondary | ICD-10-CM

## 2016-07-24 ENCOUNTER — Other Ambulatory Visit: Payer: Medicare Other

## 2016-07-25 LAB — COMPREHENSIVE METABOLIC PANEL
A/G RATIO: 1.8 (ref 1.2–2.2)
ALK PHOS: 90 IU/L (ref 39–117)
ALT: 20 IU/L (ref 0–32)
AST: 37 IU/L (ref 0–40)
Albumin: 3.7 g/dL (ref 3.5–5.5)
BUN/Creatinine Ratio: 15 (ref 9–23)
BUN: 12 mg/dL (ref 6–24)
Bilirubin Total: 0.3 mg/dL (ref 0.0–1.2)
CO2: 19 mmol/L (ref 18–29)
Calcium: 8.9 mg/dL (ref 8.7–10.2)
Chloride: 102 mmol/L (ref 96–106)
Creatinine, Ser: 0.81 mg/dL (ref 0.57–1.00)
GFR calc Af Amer: 95 mL/min/{1.73_m2} (ref >59)
GFR, EST NON AFRICAN AMERICAN: 82 mL/min/{1.73_m2} (ref >59)
GLOBULIN, TOTAL: 2.1 g/dL (ref 1.5–4.5)
Glucose: 95 mg/dL (ref 65–99)
POTASSIUM: 3.9 mmol/L (ref 3.5–5.2)
Sodium: 138 mmol/L (ref 134–144)
Total Protein: 5.8 g/dL — ABNORMAL LOW (ref 6.0–8.5)

## 2016-07-25 LAB — CBC WITH DIFFERENTIAL/PLATELET
BASOS: 1 %
Basophils Absolute: 0 10*3/uL (ref 0.0–0.2)
EOS (ABSOLUTE): 0.1 10*3/uL (ref 0.0–0.4)
EOS: 3 %
Hematocrit: 31.2 % — ABNORMAL LOW (ref 34.0–46.6)
Hemoglobin: 10.6 g/dL — ABNORMAL LOW (ref 11.1–15.9)
Immature Grans (Abs): 0 10*3/uL (ref 0.0–0.1)
Immature Granulocytes: 0 %
LYMPHS ABS: 1.1 10*3/uL (ref 0.7–3.1)
Lymphs: 34 %
MCH: 35.2 pg — AB (ref 26.6–33.0)
MCHC: 34 g/dL (ref 31.5–35.7)
MCV: 104 fL — AB (ref 79–97)
MONOS ABS: 0 10*3/uL — AB (ref 0.1–0.9)
Monocytes: 0 %
Neutrophils Absolute: 1.9 10*3/uL (ref 1.4–7.0)
Neutrophils: 62 %
Platelets: 176 10*3/uL (ref 150–379)
RBC: 3.01 x10E6/uL — ABNORMAL LOW (ref 3.77–5.28)
RDW: 15.8 % — AB (ref 12.3–15.4)
WBC: 3.1 10*3/uL — ABNORMAL LOW (ref 3.4–10.8)

## 2016-07-25 LAB — LIPASE: Lipase: 113 U/L — ABNORMAL HIGH (ref 14–72)

## 2016-07-25 NOTE — Discharge Summary (Signed)
[facility] Discharge Summary  Patient name: [name] Medical record number: [id number] Date of birth: 1960/06/27 Age: 56 y.o. Gender: female Date of Admission: 07/17/2016  Date of Discharge: 07/21/2016 Admitting Physician: Alveda Reasons, MD  Primary Care Provider: [name], MD Consultants: Cardiology, Gastroenterology   Indication for Hospitalization: Chest pain, epigastric pain  Discharge Diagnoses/Problem List:  Chest pain Epigastric pain Dysphagia HTB GERD History of alcohol abuse Hearing impairment MDD/GAD HLD  Disposition: Home  Discharge Condition: Stable, improved  Discharge Exam:  General: Sitting up in bed, in NA, hearing aid in place HEENT: EOMI, MMM  Neck: Supple, no LAD  Cardiovascular: RRR, no MRG  Respiratory:  CTA B/L with comfortable work of breathing  Gastrointestinal: +BS, soft  MSK: No edema  Derm: warm and dry  Neuro: AAOx3, no focal deficits Psych: Normal mood and affect   Brief Hospital Course:   Kodee Drury is a 56 yo F.  Presented to the ED with complaints of chest pain and epigastric pain with associated nausea and vomiting.  Her chest pain was atypical in nature and ACS ruled out with negative troponins x3 and normal EKG.  Cardiology was consulted and noted that it was likely pill esophagitis.  Protonix was given BID along with scheduled GI cocktails.  In addition, GI was consulted given her history of dysphagia with a subsequent episode of NSTEMI in the past.  GI recommended an EGD which revealed diffuse candidiasis in upper and middle 1/3 of esophagus and an obstructing Schatzki ring.  The ring was dilated and she was started on Diflucan.  Per GI recs, she resumed a clear diet and advanced to soft diet.  She will follow up with GI Dr. Ardis Hughs outpatient in 1 month.    Of note, patient with history of leukopenia since 2017. HIV negative.  In setting of new Candida, a peripheral smear was obtained and reviewed .   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  CIWA scores were monitored for signs of alcohol withdrawal and remained stable at 0.  At time of discharge, patient was stable and symptoms improved.    Issues for Follow Up:  1. Follow up outpatient in 1 month with Dr. Ardis Hughs (GI).  2. Patient to complete a course of Diflucan for Candida esophagitis.  3. Consider outpatient follow up with heme/onc for leukopenia.   Significant Procedures: EGD  Significant Labs and Imaging:   Recent Labs Lab 07/19/16 0248 07/20/16 0307 07/21/16 0306 07/24/16 1229  WBC 2.0* 1.7* 2.3* 3.1*  HGB 10.6* 10.4* 11.0*  --   HCT 31.8* 31.5* 33.6* 31.2*  PLT 142* 121* 114* 176    Recent Labs Lab 07/19/16 0248 07/20/16 0307 07/21/16 0306 07/24/16 1229  NA 136 141 137 138  K 3.3* 4.4 4.2 3.9  CL 100* 107 107 102  CO2 24 27 26 19   GLUCOSE 94 91 99 95  BUN 10 6 <5* 12  CREATININE 0.94 0.87 1.09* 0.81  CALCIUM 7.7* 8.3* 8.4* 8.9  ALKPHOS  --   --   --  90  AST  --   --   --  37  ALT  --   --   --  20  ALBUMIN  --   --   --  3.7   Results/Tests Pending at Time of Discharge: None  Discharge Medications:  Allergies as of 07/21/2016      Reactions   Depakote [divalproex Sodium] Anaphylaxis  Diazepam Other (See Comments)   Died on operating table   Valium Anaphylaxis   Tizanidine Hcl Other (See Comments)   "passed out"      Medication List    STOP taking these medications   ondansetron 4 MG tablet Commonly known as:  ZOFRAN     TAKE these medications   ALLEGRA ALLERGY 180 MG tablet Generic drug:  fexofenadine Take 180 mg by mouth daily.   aspirin 81 MG chewable tablet Chew 81 mg by mouth daily.   atorvastatin 40 MG tablet Commonly known as:  LIPITOR Take 1 tablet (40 mg total) by mouth daily at 6 PM.   Fish Oil 1000 MG Caps Take 1,000 mg by mouth 2 (two) times daily.   fluconazole 100 MG tablet Commonly known as:  DIFLUCAN Take 1 tablet  (100 mg total) by mouth daily.   fluticasone 50 MCG/ACT nasal spray Commonly known as:  FLONASE Place 2 sprays into both nostrils daily.   folic acid 1 MG tablet Commonly known as:  FOLVITE Take 1 mg by mouth daily.   nitroGLYCERIN 0.4 MG SL tablet Commonly known as:  NITROSTAT Place 1 tablet (0.4 mg total) under the tongue every 5 (five) minutes as needed for chest pain.   ondansetron 4 MG disintegrating tablet Commonly known as:  ZOFRAN ODT Take 1 tablet (4 mg total) by mouth every 8 (eight) hours as needed for nausea.   prazosin 2 MG capsule Commonly known as:  MINIPRESS Take 2 mg by mouth at bedtime.   PRESCRIPTION MEDICATION Inhale into the lungs at bedtime. CPAP   VIIBRYD 40 MG Tabs Generic drug:  Vilazodone HCl Take 40 mg by mouth daily.   vitamin B-12 500 MCG tablet Commonly known as:  CYANOCOBALAMIN Take 500 mcg by mouth daily.   Vitamin D3 2000 units Tabs Take 2,000 Units by mouth daily.      Discharge Instructions: Please refer to Patient Instructions section of EMR for full details.  Patient was counseled important signs and symptoms that should prompt return to medical care, changes in medications, dietary instructions, activity restrictions, and follow up appointments.   Follow-Up Appointments: Follow-up Information    Milus Banister, MD Follow up on 09/17/2016.   Specialty:  Gastroenterology Why:  11 AM for follow up of swallowing issue.   Call if you need to be seen sooner.  Contact information: [address] [place] [address] [phone]          Lovenia Kim, MD 07/25/2016, 10:09 PM PGY-1, [place]

## 2016-07-27 NOTE — Addendum Note (Signed)
Addended by: [name] on: 07/27/2016 08:18 PM   Modules accepted: Orders

## 2016-07-29 ENCOUNTER — Encounter: Payer: Self-pay | Admitting: Gynecology

## 2016-07-29 DIAGNOSIS — Z803 Family history of malignant neoplasm of breast: Secondary | ICD-10-CM | POA: Diagnosis not present

## 2016-07-29 DIAGNOSIS — Z1231 Encounter for screening mammogram for malignant neoplasm of breast: Secondary | ICD-10-CM | POA: Diagnosis not present

## 2016-08-05 ENCOUNTER — Encounter: Payer: Self-pay | Admitting: *Deleted

## 2016-08-13 ENCOUNTER — Ambulatory Visit (INDEPENDENT_AMBULATORY_CARE_PROVIDER_SITE_OTHER): Payer: Medicare Other | Admitting: Family Medicine

## 2016-08-13 VITALS — BP 110/74 | HR 83 | Temp 97.4°F | Resp 16 | Ht 63.0 in | Wt 128.4 lb

## 2016-08-13 DIAGNOSIS — R740 Nonspecific elevation of levels of transaminase and lactic acid dehydrogenase [LDH]: Secondary | ICD-10-CM | POA: Diagnosis not present

## 2016-08-13 DIAGNOSIS — E162 Hypoglycemia, unspecified: Secondary | ICD-10-CM

## 2016-08-13 DIAGNOSIS — R7401 Elevation of levels of liver transaminase levels: Secondary | ICD-10-CM

## 2016-08-13 DIAGNOSIS — K859 Acute pancreatitis without necrosis or infection, unspecified: Secondary | ICD-10-CM

## 2016-08-13 LAB — POCT CBC
GRANULOCYTE PERCENT: 67 % (ref 37–80)
HCT, POC: 34.5 % — AB (ref 37.7–47.9)
HEMOGLOBIN: 11.8 g/dL — AB (ref 12.2–16.2)
Lymph, poc: 0.9 (ref 0.6–3.4)
MCH: 34.9 pg — AB (ref 27–31.2)
MCHC: 34.2 g/dL (ref 31.8–35.4)
MCV: 102 fL — AB (ref 80–97)
MID (cbc): 0.2 (ref 0–0.9)
MPV: 7.9 fL (ref 0–99.8)
POC Granulocyte: 2.3 (ref 2–6.9)
POC LYMPH PERCENT: 27.6 %L (ref 10–50)
POC MID %: 5.4 %M (ref 0–12)
Platelet Count, POC: 156 10*3/uL (ref 142–424)
RBC: 3.38 M/uL — AB (ref 4.04–5.48)
RDW, POC: 16.2 %
WBC: 3.4 10*3/uL — AB (ref 4.6–10.2)

## 2016-08-13 LAB — POCT URINALYSIS DIP (MANUAL ENTRY)
GLUCOSE UA: NEGATIVE
Leukocytes, UA: NEGATIVE
Nitrite, UA: NEGATIVE
PH UA: 6 (ref 5.0–8.0)
Protein Ur, POC: 30 — AB
Spec Grav, UA: 1.025 (ref 1.030–1.035)
Urobilinogen, UA: 0.2 (ref ?–2.0)

## 2016-08-13 LAB — POCT SEDIMENTATION RATE: POCT SED RATE: 27 mm/h — AB (ref 0–22)

## 2016-08-13 MED ORDER — BLOOD GLUCOSE MONITOR KIT: PACK | 0 refills | Status: DC

## 2016-08-13 NOTE — Patient Instructions (Addendum)
IF you received an x-ray today, you will receive an invoice from [facility]. Please contact [facility]iology at [phone] with questions or concerns regarding your invoice.   IF you received labwork today, you will receive an invoice from [facility]. Please contact [facility] at [phone] with questions or concerns regarding your invoice.   Our billing staff will not be able to assist you with questions regarding bills from these companies.  You will be contacted with the lab results as soon as they are available. The fastest way to get your results is to activate your My Chart account. Instructions are located on the last page of this paperwork. If you have not heard from [place] regarding the results in 2 weeks, please contact this office.    Low-Fat Diet for Pancreatitis or Gallbladder Conditions A low-fat diet can be helpful if you have pancreatitis or a gallbladder condition. With these conditions, your pancreas and gallbladder have trouble digesting fats. A healthy eating plan with less fat will help rest your pancreas and gallbladder and reduce your symptoms. What do I need to know about this diet?  Eat a low-fat diet.  Reduce your fat intake to less than 20-30% of your total daily calories. This is less than 50-60 g of fat per day.  Remember that you need some fat in your diet. Ask your dietician what your daily goal should be.  Choose nonfat and low-fat healthy foods. Look for the words "nonfat," "low fat," or "fat free."  As a guide, look on the label and choose foods with less than 3 g of fat per serving. Eat only one serving.  Avoid alcohol.  Do not smoke. If you need help quitting, talk with your health care provider.  Eat small frequent meals instead of three large heavy meals. What foods can I eat? Grains  Include healthy grains and starches such as potatoes, wheat bread, fiber-rich cereal, and brown rice. Choose whole grain options whenever possible.  In adults, whole grains should account for 45-65% of your daily calories. Fruits and Vegetables  Eat plenty of fruits and vegetables. Fresh fruits and vegetables add fiber to your diet. Meats and Other Protein Sources  Eat lean meat such as chicken and pork. Trim any fat off of meat before cooking it. Eggs, fish, and beans are other sources of protein. In adults, these foods should account for 10-35% of your daily calories. Dairy  Choose low-fat milk and dairy options. Dairy includes fat and protein, as well as calcium. Fats and Oils  Limit high-fat foods such as fried foods, sweets, baked goods, sugary drinks. Other  Creamy sauces and condiments, such as mayonnaise, can add extra fat. Think about whether or not you need to use them, or use smaller amounts or low fat options. What foods are not recommended?  High fat foods, such as:  [employer].  Ice cream.  [place] toast.  Sweet rolls.  Pizza.  Cheese bread.  Foods covered with batter, butter, creamy sauces, or cheese.  Fried foods.  Sugary drinks and desserts.  Foods that cause gas or bloating This information is not intended to replace advice given to you by your health care provider. Make sure you discuss any questions you have with your health care provider. Document Released: 05/01/2013 Document Revised: 10/02/2015 Document Reviewed: 04/09/2013 Elsevier Interactive Patient Education  2017 [place].  Hypoglycemia Hypoglycemia occurs when the level of sugar (glucose) in the blood is too low. Glucose is a type of sugar  that provides the body's main source of energy. Certain hormones (insulin and glucagon) control the level of glucose in the blood. Insulin lowers blood glucose, and glucagon increases blood glucose. Hypoglycemia can result from having too much insulin in the bloodstream, or from not eating enough food that contains glucose. Hypoglycemia can happen in people who do or do not have diabetes. It can develop  quickly, and it can be a medical emergency. What are the causes? Hypoglycemia occurs most often in people who have diabetes. If you have diabetes, hypoglycemia may be caused by:  Diabetes medicine.  Not eating enough, or not eating often enough.  Increased physical activity.  Drinking alcohol, especially when you have not eaten recently. If you do not have diabetes, hypoglycemia may be caused by:  A tumor in the pancreas. The pancreas is the organ that makes insulin.  Not eating enough, or not eating for long periods at a time (fasting).  Severe infection or illness that affects the liver, heart, or kidneys.  Certain medicines. You may also have reactive hypoglycemia. This condition causes hypoglycemia within 4 hours of eating a meal. This may occur after having stomach surgery. Sometimes, the cause of reactive hypoglycemia is not known. What increases the risk? Hypoglycemia is more likely to develop in:  People who have diabetes and take medicines to lower blood glucose.  People who abuse alcohol.  People who have a severe illness. What are the signs or symptoms? Hypoglycemia may not cause any symptoms. If you have symptoms, they may include:  Hunger.  Anxiety.  Sweating and feeling clammy.  Confusion.  Dizziness or feeling light-headed.  Sleepiness.  Nausea.  Increased heart rate.  Headache.  Blurry vision.  Seizure.  Nightmares.  Tingling or numbness around the mouth, lips, or tongue.  A change in speech.  Decreased ability to concentrate.  A change in coordination.  Restless sleep.  Tremors or shakes.  Fainting.  Irritability. How is this diagnosed? Hypoglycemia is diagnosed with a blood test to measure your blood glucose level. This blood test is done while you are having symptoms. Your health care provider may also do a physical exam and review your medical history. If you do not have diabetes, other tests may be done to find the cause of  your hypoglycemia. How is this treated? This condition can often be treated by immediately eating or drinking something that contains glucose, such as:  3-4 sugar tablets (glucose pills).  Glucose gel, 15-gram tube.  Fruit juice, 4 oz (120 mL).  Regular soda (not diet soda), 4 oz (120 mL).  Low-fat milk, 4 oz (120 mL).  Several pieces of hard candy.  Sugar or honey, 1 Tbsp. Treating Hypoglycemia If You Have Diabetes   If you are alert and able to swallow safely, follow the 15:15 rule:  Take 15 grams of a rapid-acting carbohydrate. Rapid-acting options include:  1 tube of glucose gel.  3 glucose pills.  6-8 pieces of hard candy.  4 oz (120 mL) of fruit juice.  4 oz (120 ml) of regular (not diet) soda.  Check your blood glucose 15 minutes after you take the carbohydrate.  If the repeat blood glucose level is still at or below 70 mg/dL (3.9 mmol/L), take 15 grams of a carbohydrate again.  If your blood glucose level does not increase above 70 mg/dL (3.9 mmol/L) after 3 tries, seek emergency medical care.  After your blood glucose level returns to normal, eat a meal or a snack within 1  hour. Treating Severe Hypoglycemia  Severe hypoglycemia is when your blood glucose level is at or below 54 mg/dL (3 mmol/L). Severe hypoglycemia is an emergency. Do not wait to see if the symptoms will go away. Get medical help right away. Call your local emergency services (911 in the [place] Do not drive yourself to the hospital. If you have severe hypoglycemia and you cannot eat or drink, you may need an injection of glucagon. A family member or close friend should learn how to check your blood glucose and how to give you a glucagon injection. Ask your health care provider if you need to have an emergency glucagon injection kit available. Severe hypoglycemia may need to be treated in a hospital. The treatment may include getting glucose through an IV tube. You may also need treatment for the  cause of your hypoglycemia. Follow these instructions at home: General instructions   Avoid any diets that cause you to not eat enough food. Talk with your health care provider before you start any new diet.  Take over-the-counter and prescription medicines only as told by your health care provider.  Limit alcohol intake to no more than 1 drink per day for nonpregnant women and 2 drinks per day for men. One drink equals 12 oz of beer, 5 oz of wine, or 1 oz of hard liquor.  Keep all follow-up visits as told by your health care provider. This is important. If You Have Diabetes:    Make sure you know the symptoms of hypoglycemia.  Always have a rapid-acting carbohydrate snack with you to treat low blood sugar.  Follow your diabetes management plan, as told by your health care provider. Make sure you:  Take your medicines as directed.  Follow your exercise plan.  Follow your meal plan. Eat on time, and do not skip meals.  Check your blood glucose as often as directed. Make sure to check your blood glucose before and after exercise. If you exercise longer or in a different way than usual, check your blood glucose more often.  Follow your sick day plan whenever you cannot eat or drink normally. Make this plan in advance with your health care provider.  Share your diabetes management plan with people in your workplace, school, and household.  Check your urine for ketones when you are ill and as told by your health care provider.  Carry a medical alert card or wear medical alert jewelry. If You Have Reactive Hypoglycemia or Low Blood Sugar From Other Causes:   Monitor your blood glucose as told by your health care provider.  Follow instructions from your health care provider about eating or drinking restrictions. Contact a health care provider if:  You have problems keeping your blood glucose in your target range.  You have frequent episodes of hypoglycemia. Get help right away  if:  You continue to have hypoglycemia symptoms after eating or drinking something containing glucose.  Your blood glucose is at or below 54 mg/dL (3 mmol/L).  You have a seizure.  You faint. These symptoms may represent a serious problem that is an emergency. Do not wait to see if the symptoms will go away. Get medical help right away. Call your local emergency services (911 in the [place] Do not drive yourself to the hospital. This information is not intended to replace advice given to you by your health care provider. Make sure you discuss any questions you have with your health care provider. Document Released: 04/26/2005 Document Revised: 10/08/2015 Document  Reviewed: 05/30/2015 Elsevier Interactive Patient Education  2017 [employer].

## 2016-08-13 NOTE — Progress Notes (Signed)
Subjective:  By signing my name below, I, [name], attest that this documentation has been prepared under the direction and in the presence of [name], MD Electronically Signed: Ladene Artist, ED Scribe 08/13/2016 at 11:06 AM.   Patient ID: [name], female    DOB: [birth year], 56 y.o.   MRN: [id number]  Chief Complaint  Patient presents with  . Follow-up  . pancreas   HPI [name] is a 56 y.o. female who presents to Primary Care at [facility] for a follow-up. Pt is taking R48 and folic acid. She states that her appetite has improved; she has eaten a lot of salads, scrambled eggs, humus and bananas. She is trying to increase her meat intake. Pt states that she is still experiencing some nausea if she waits too long to eat and constipation after consuming chocolate flavored Ensure. She has also noticed intermittent light-headedness with standing and occasional choking with taking her pills but not with eating. Pt has noticed that she gets full quickly and has to eat small amounts more often. She denies diarrhea, decreased urine output.  Fall  Pt reports a fall that occurred approximately 6 days ago. She states that she sprinted across the room to grab her phone she she fell and struck the right side of her face. Pt reports a large amount of bruising to her right cheek. She denies LOC, visual disturbances, dental issues, difficulty swallowing. She is taking a baby aspirin daily.   Past Medical History:  Diagnosis Date  . Allergy    SEASONAL  . Anemia   . Anxiety   . Arthritis   . ASD (atrial septal defect)    a. s/p ASD repair in 1973  . CAD (coronary artery disease)    a. 12/2015: NSTEMI occurring after cardiac arrest secondary to aspiration PNA. Echo w/ EF of 55-60%, no WMA. Outpt ischemic eval needed.  . Cardiomyopathy   . Deaf   . Depression   . Dysphagia    due to esophageal infections.  . Fibromyalgia   . Gastritis   . GERD (gastroesophageal reflux disease)   .  Hearing impairment   . Hypertension    [name], take htn medication to regulate heart beat.  . Migraine   . Osteoporosis   . Pulmonary embolism ([place])    occured post c-section of her daughter  . Sleep apnea   . Vitamin D deficiency    Current Outpatient Prescriptions on File Prior to Visit  Medication Sig Dispense Refill  . aspirin 81 MG chewable tablet Chew 81 mg by mouth daily.    [name] atorvastatin (LIPITOR) 40 MG tablet Take 1 tablet (40 mg total) by mouth daily at 6 PM. 90 tablet 1  . Cholecalciferol (VITAMIN D3) 2000 units TABS Take 2,000 Units by mouth daily.     . fexofenadine (ALLEGRA ALLERGY) 180 MG tablet Take 180 mg by mouth daily.    . fluconazole (DIFLUCAN) 100 MG tablet Take 1 tablet (100 mg total) by mouth daily. 20 tablet 0  . fluticasone (FLONASE) 50 MCG/ACT nasal spray Place 2 sprays into both nostrils daily.    . folic acid (FOLVITE) 1 MG tablet Take 1 mg by mouth daily.    . metoprolol tartrate (LOPRESSOR) 25 MG tablet Take 1 tablet (25 mg total) by mouth 2 (two) times daily. 180 tablet 3  . nitroGLYCERIN (NITROSTAT) 0.4 MG SL tablet Place 1 tablet (0.4 mg total) under the tongue every 5 (five) minutes as needed for  chest pain. 30 tablet 0  . Nutritional Supplements (HIGH-PROTEIN NUTRITIONAL SHAKE) LIQD Drink between meals three times a day. Ok to use brand and flavor of pt's choice. No ingredient restrictions. 90 Bottle 2  . Omega-3 Fatty Acids (FISH OIL) 1000 MG CAPS Take 1,000 mg by mouth 2 (two) times daily.     . ondansetron (ZOFRAN ODT) 4 MG disintegrating tablet Take 1 tablet (4 mg total) by mouth every 8 (eight) hours as needed for nausea. 6 tablet 0  . pantoprazole (PROTONIX) 40 MG tablet Take 1 tablet (40 mg total) by mouth 2 (two) times daily before a meal. 60 tablet 1  . prazosin (MINIPRESS) 2 MG capsule Take 2 mg by mouth at bedtime.     [name] PRESCRIPTION MEDICATION Inhale into the lungs at bedtime. CPAP    . Vilazodone HCl (VIIBRYD) 40 MG TABS Take 40 mg by mouth  daily.    . vitamin B-12 (CYANOCOBALAMIN) 500 MCG tablet Take 500 mcg by mouth daily.     No current facility-administered medications on file prior to visit.    Allergies  Allergen Reactions  . Depakote [Divalproex Sodium] Anaphylaxis  . Diazepam Other (See Comments)    Died on operating table  . Valium Anaphylaxis  . Tizanidine Hcl Other (See Comments)    "passed out"   Review of Systems  Constitutional: Negative for appetite change.  HENT: Negative for dental problem and trouble swallowing.   Eyes: Negative for visual disturbance.  Respiratory: Positive for choking.   Gastrointestinal: Positive for constipation and nausea. Negative for diarrhea.  Genitourinary: Negative for decreased urine volume.  Skin: Positive for color change (R cheek).  Neurological: Positive for light-headedness. Negative for syncope.      Objective:   Physical Exam  Constitutional: She is oriented to person, place, and time. She appears well-developed and well-nourished. No distress.  HENT:  Head: Normocephalic.  Hematoma beneath the ecchymosis of approximately 2x4 cm down the R nasolabial folds.   Eyes: Conjunctivae and EOM are normal.  Neck: Neck supple. No tracheal deviation present.  Cardiovascular: Normal rate, regular rhythm and normal heart sounds.   Pulmonary/Chest: Effort normal and breath sounds normal. No respiratory distress.  Abdominal: Soft. Bowel sounds are normal. There is no tenderness. There is no CVA tenderness.  Musculoskeletal: Normal range of motion.  Neurological: She is alert and oriented to person, place, and time.  Skin: Skin is warm and dry.  Psychiatric: She has a normal mood and affect. Her behavior is normal.  Nursing note and vitals reviewed.   BP 110/74   Pulse 83   Temp 97.4 F (36.3 C) (Oral)   Resp 16   Ht '5\' 3"'  (1.6 m)   Wt 128 lb 6.4 oz (58.2 kg)   SpO2 100%   BMI 22.75 kg/m      Results for orders placed or performed in visit on 08/13/16  POCT CBC    Result Value Ref Range   WBC 3.4 (A) 4.6 - 10.2 K/uL   Lymph, poc 0.9 0.6 - 3.4   POC LYMPH PERCENT 27.6 10 - 50 %L   MID (cbc) 0.2 0 - 0.9   POC MID % 5.4 0 - 12 %M   POC Granulocyte 2.3 2 - 6.9   Granulocyte percent 67.0 37 - 80 %G   RBC 3.38 (A) 4.04 - 5.48 M/uL   Hemoglobin 11.8 (A) 12.2 - 16.2 g/dL   HCT, POC 34.5 (A) 37.7 - 47.9 %   MCV 102 (  A) 80 - 97 fL   MCH, POC 34.9 (A) 27 - 31.2 pg   MCHC 34.2 31.8 - 35.4 g/dL   RDW, POC 16.2 %   Platelet Count, POC 156 142 - 424 K/uL   MPV 7.9 0 - 99.8 fL  POCT urinalysis dipstick  Result Value Ref Range   Color, UA yellow yellow   Clarity, UA clear clear   Glucose, UA negative negative   Bilirubin, UA moderate (A) negative   Ketones, POC UA trace (5) (A) negative   Spec Grav, UA 1.025 1.030 - 1.035   Blood, UA trace-intact (A) negative   pH, UA 6.0 5.0 - 8.0   Protein Ur, POC =30 (A) negative   Urobilinogen, UA 0.2 Negative - 2.0   Nitrite, UA Negative Negative   Leukocytes, UA Negative Negative    Assessment & Plan:   1. Transaminitis   2. Acute pancreatitis, unspecified complication status, unspecified pancreatitis type   3. Hypoglycemia    Pt's hgba1c has been remarkably low prior so start checking cbgs when she is having hypoglycemic type sxs and recheck w/ me in 2 wks to review. Reviewed diet recs in detail, ways to increase protein in diet, start nighttime snack to help w/ a.m. sxs.  Orders Placed This Encounter  Procedures  . Comprehensive metabolic panel  . Lipase  . POCT CBC  . POCT SEDIMENTATION RATE  . POCT urinalysis dipstick    Meds ordered this encounter  Medications  . blood glucose meter kit and supplies KIT    Sig: Dispense based on patient and insurance preference. Use up to SIX times daily as directed. (FOR ICD-10 e16.21).    Dispense:  1 each    Refill:  0    Order Specific Question:   Number of strips    Answer:   1000    Order Specific Question:   Number of lancets    Answer:   1000    Over 40 min spent in face-to-face evaluation of and consultation with patient and coordination of care.  Over 50% of this time was spent counseling this patient.  I personally performed the services described in this documentation, which was scribed in my presence. The recorded information has been reviewed and considered, and addended by me as needed.   [name], M.D.  Primary Care at [facility] [address] [place], [place] [address] [phone] phone [phone] fax  08/15/16 10:06 PM

## 2016-08-14 LAB — COMPREHENSIVE METABOLIC PANEL
A/G RATIO: 2 (ref 1.2–2.2)
ALT: 28 IU/L (ref 0–32)
AST: 41 IU/L — ABNORMAL HIGH (ref 0–40)
Albumin: 4.2 g/dL (ref 3.5–5.5)
Alkaline Phosphatase: 82 IU/L (ref 39–117)
BUN/Creatinine Ratio: 20 (ref 9–23)
BUN: 14 mg/dL (ref 6–24)
Bilirubin Total: 0.3 mg/dL (ref 0.0–1.2)
CALCIUM: 9 mg/dL (ref 8.7–10.2)
CO2: 20 mmol/L (ref 18–29)
CREATININE: 0.71 mg/dL (ref 0.57–1.00)
Chloride: 102 mmol/L (ref 96–106)
GFR, EST AFRICAN AMERICAN: 111 mL/min/{1.73_m2} (ref >59)
GFR, EST NON AFRICAN AMERICAN: 96 mL/min/{1.73_m2} (ref >59)
Globulin, Total: 2.1 g/dL (ref 1.5–4.5)
Glucose: 95 mg/dL (ref 65–99)
POTASSIUM: 3.7 mmol/L (ref 3.5–5.2)
Sodium: 139 mmol/L (ref 134–144)
TOTAL PROTEIN: 6.3 g/dL (ref 6.0–8.5)

## 2016-08-14 LAB — LIPASE: Lipase: 75 U/L — ABNORMAL HIGH (ref 14–72)

## 2016-08-20 ENCOUNTER — Ambulatory Visit (INDEPENDENT_AMBULATORY_CARE_PROVIDER_SITE_OTHER): Payer: Medicare Other | Admitting: Cardiology

## 2016-08-20 ENCOUNTER — Encounter: Payer: Self-pay | Admitting: Cardiology

## 2016-08-20 VITALS — BP 130/70 | HR 106 | Ht 63.0 in | Wt 127.0 lb

## 2016-08-20 DIAGNOSIS — Q211 Atrial septal defect, unspecified: Secondary | ICD-10-CM

## 2016-08-20 DIAGNOSIS — I10 Essential (primary) hypertension: Secondary | ICD-10-CM | POA: Diagnosis not present

## 2016-08-20 DIAGNOSIS — R002 Palpitations: Secondary | ICD-10-CM

## 2016-08-20 DIAGNOSIS — Z9889 Other specified postprocedural states: Secondary | ICD-10-CM

## 2016-08-20 NOTE — Progress Notes (Signed)
Cardiology Office Note    Date:  08/20/2016   ID:  [name], DOB 06/03/1960, MRN [id number]  PCP:  [name], MD  Cardiologist:   Candee Furbish, MD     History of Present Illness:  [name] is a 56 y.o. 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 She felt dizzy and felt as though she was going to pass out, hands were numb. She had nausea as well as epigastric abdominal discomfort, nonradiating, crampy. 7/10. She took an extra dose of her carvedilol 25 mg during episode of palpitations. Chest x-ray unremarkable. EKG on 06/24/13 showed nonspecific ST-T wave changes, sinus rhythm, borderline QT prolongation. Once she ate she felt better.    12/2015  - Choking on pills then had acute resp failure, CPR. Cardiac arrest. Swallowing is a little better. GI. Yeast. Diflucan. Stretch was done she states, she is eating better.  Very emotional about this hospitalization. Currently not having any chest pain, no shortness of breath, no fevers.   Past Medical History:  Diagnosis Date  . Allergy    SEASONAL  . Anemia   . Anxiety   . Arthritis   . ASD (atrial septal defect)    a. s/p ASD repair in 1973  . CAD (coronary artery disease)    a. 12/2015: NSTEMI occurring after cardiac arrest secondary to aspiration PNA. Echo w/ EF of 55-60%, no WMA. Outpt ischemic eval needed.  . Cardiomyopathy   . Deaf   . Depression   . Dysphagia    due to esophageal infections.  . Fibromyalgia   . Gastritis   . GERD (gastroesophageal reflux disease)   . Hearing impairment   . Hypertension    [name], take htn medication to regulate heart beat.  . Migraine   . Osteoporosis   . Pulmonary embolism ([place])    occured post c-section of her daughter  . Sleep apnea   . Vitamin D deficiency     Past Surgical History:    Procedure Laterality Date  . APPENDECTOMY    . CARDIAC SURGERY  January 2007   mitral valve repair  . CARDIAC SURGERY  1973   atrial septal defect  . CARDIAC SURGERY    . COCHLEAR IMPLANT    . ESOPHAGOGASTRODUODENOSCOPY (EGD) WITH PROPOFOL N/A 07/20/2016   Procedure: ESOPHAGOGASTRODUODENOSCOPY (EGD) WITH PROPOFOL;  Surgeon: Gatha Mayer, MD;  Location: [facility];  Service: Endoscopy;  Laterality: N/A;  . INNER EAR SURGERY     TUBES  . LAPAROSCOPIC CHOLECYSTECTOMY  2012  . TONSILLECTOMY AND ADENOIDECTOMY    . TUBAL LIGATION      Current Medications: Outpatient Medications Prior to Visit  Medication Sig Dispense Refill  . aspirin 81 MG chewable tablet Chew 81 mg by mouth daily.    [name] atorvastatin (LIPITOR) 40 MG tablet Take 1 tablet (40 mg total) by mouth daily at 6 PM. 90 tablet 1  . blood glucose meter kit and supplies KIT Dispense based on patient and insurance preference. Use up to SIX times daily as directed. (FOR ICD-10 e16.21). 1 each 0  . Cholecalciferol (VITAMIN D3) 2000 units TABS Take 2,000 Units by mouth daily.     . fexofenadine (ALLEGRA ALLERGY) 180 MG tablet Take 180 mg by mouth daily.    . fluconazole (DIFLUCAN) 100 MG tablet Take 1 tablet (  100 mg total) by mouth daily. 20 tablet 0  . fluticasone (FLONASE) 50 MCG/ACT nasal spray Place 2 sprays into both nostrils daily.    . metoprolol tartrate (LOPRESSOR) 25 MG tablet Take 1 tablet (25 mg total) by mouth 2 (two) times daily. 180 tablet 3  . nitroGLYCERIN (NITROSTAT) 0.4 MG SL tablet Place 1 tablet (0.4 mg total) under the tongue every 5 (five) minutes as needed for chest pain. 30 tablet 0  . Nutritional Supplements (HIGH-PROTEIN NUTRITIONAL SHAKE) LIQD Drink between meals three times a day. Ok to use brand and flavor of pt's choice. No ingredient restrictions. 90 Bottle 2  . Omega-3 Fatty Acids (FISH OIL) 1000 MG CAPS Take 1,000 mg by mouth 2 (two) times daily.     . ondansetron (ZOFRAN ODT) 4 MG disintegrating  tablet Take 1 tablet (4 mg total) by mouth every 8 (eight) hours as needed for nausea. 6 tablet 0  . pantoprazole (PROTONIX) 40 MG tablet Take 1 tablet (40 mg total) by mouth 2 (two) times daily before a meal. 60 tablet 1  . prazosin (MINIPRESS) 2 MG capsule Take 2 mg by mouth at bedtime.     [name] PRESCRIPTION MEDICATION Inhale into the lungs at bedtime. CPAP    . Vilazodone HCl (VIIBRYD) 40 MG TABS Take 40 mg by mouth daily.    . vitamin B-12 (CYANOCOBALAMIN) 500 MCG tablet Take 500 mcg by mouth daily.    . folic acid (FOLVITE) 1 MG tablet Take 1 mg by mouth daily.     No facility-administered medications prior to visit.      Allergies:   Depakote [divalproex sodium]; Diazepam; Valium; and Tizanidine hcl   Social History   Social History  . Marital status: Single    Spouse name: N/A  . Number of children: N/A  . Years of education: N/A   Social History Main Topics  . Smoking status: Never Smoker  . Smokeless tobacco: Never Used  . 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  . Drug use: No  . Sexual activity: Yes    Birth control/ protection: Surgical     Comment: 1st intercourse- 19, partners- 6   Other Topics Concern  . Not on file   Social History Narrative   Lives with 44 year old daughter.         Family History:  The patient's family history includes Breast cancer in her mother; Depression in her sister; Heart attack in her father; Heart disease in her father; Hypertension in her father. ROS:   Please see the history of present illness.    ROS All other systems reviewed and are negative.   PHYSICAL EXAM:   VS:  BP 130/70   Pulse (!) 106   Ht '5\' 3"'  (1.6 m)   Wt 127 lb (57.6 kg)   SpO2 96%   BMI 22.50 kg/m    GEN: Well nourished, well developed, in no acute distress  HEENT: normal  Neck: no JVD, carotid bruits, or masses Cardiac: RRR; soft systolic murmur, no rubs, or gallops,no edema  Respiratory:  clear to  auscultation bilaterally, normal work of breathing GI: soft, nontender, nondistended, + BS MS: no deformity or atrophy  Skin: warm and dry, no rash Neuro:  Alert and Oriented x 3, Strength and sensation are intact Psych: euthymic mood, full affect  Wt Readings from Last 3 Encounters:  08/20/16 127 lb (57.6 kg)  08/13/16 128 lb 6.4  oz (58.2 kg)  07/22/16 132 lb (59.9 kg)      Studies/Labs Reviewed:   EKG: None today  Recent Labs: 07/17/2016: Magnesium 1.3 07/19/2016: TSH 2.992 07/24/2016: Platelets 176 08/13/2016: ALT 28; BUN 14; Creatinine, Ser 0.71; Hemoglobin 11.8; Potassium 3.7; Sodium 139   Lipid Panel    Component Value Date/Time   CHOL 215 (H) 02/26/2016 0915   TRIG 237 (H) 02/26/2016 0915   HDL 70 02/26/2016 0915   CHOLHDL 3.1 02/26/2016 0915   VLDL 47 (H) 02/26/2016 0915   LDLCALC 98 02/26/2016 0915    Additional studies/ records that were reviewed today include:   ECHO 12/19/15 - Normal LV systolic function; s/p MV repair with mild MR; moderate   LAE; moderate TR with moderately elevated pulmonary pressure.  Nuclear stress test 01/09/16 Overall reassuring NUC study with no ischemia. Continue with medical mgt. Cardiac arrest was secondary to respiratory failure (aspiration). Reassuring EF. Elevated troponin during this episode. Increases her overall cardiovascular risk. Reviewed records    ASSESSMENT:    1. S/P MVR (mitral valve repair)   2. Palpitations   3. Atrial septal defect of fossa ovalis   4. Essential hypertension      PLAN:  In order of problems listed above:  Palpitations/bradycardia  - Improved. No changes made.  - This was seen during prior hospitalization. Dr. Caryl Comes had assessed. Heart rate currently excellent. No need for pacemaker.  Prior elevated troponin in the setting of cardiac arrest in August 2017  - This was secondary to choking, pill swallowing, aspiration, intubation. Nuclear stress test subsequently was reassuring.  Mitral  valve repair/ASD repair  - Doing well currently, intact.  Hyperlipidemia  - Watching lipids. Atorvastatin  PTSD  - From near death experience.  Alcohol use  - Continue abstinence    Medication Adjustments/Labs and Tests Ordered: Current medicines are reviewed at length with the patient today.  Concerns regarding medicines are outlined above.  Medication changes, Labs and Tests ordered today are listed in the Patient Instructions below. Patient Instructions  Medication Instructions:  The current medical regimen is effective;  continue present plan and medications.  Follow-Up: Follow up in 6 months with Cecilie Kicks, PA.  You will receive a letter in the mail 2 months before you are due.  Please call us when you receive this letter to schedule your follow up appointment.  If you need a refill on your cardiac medications before your next appointment, please call your pharmacy.  Thank you for choosing [facility]!!        Signed, Candee Furbish, MD  08/20/2016 3:54 PM    [address], [place], [place]  [address] Phone: [phone]; Fax: [phone]

## 2016-08-20 NOTE — Patient Instructions (Signed)
Medication Instructions:  The current medical regimen is effective;  continue present plan and medications.  Follow-Up: Follow up in 6 months with Cecilie Kicks, PA.  You will receive a letter in the mail 2 months before you are due.  Please call us when you receive this letter to schedule your follow up appointment.  If you need a refill on your cardiac medications before your next appointment, please call your pharmacy.  Thank you for choosing [facility]!!

## 2016-08-30 ENCOUNTER — Ambulatory Visit: Payer: Medicare Other | Admitting: Family Medicine

## 2016-08-31 NOTE — Telephone Encounter (Signed)
F/U call regarding information about [facility] infusion and her continued health problems. I asked her to call me and let me know about continued care and also was she receiving care for the osteoporosis thru her PCP>

## 2016-09-09 DIAGNOSIS — R69 Illness, unspecified: Secondary | ICD-10-CM | POA: Diagnosis not present

## 2016-09-16 ENCOUNTER — Ambulatory Visit: Payer: Medicare Other | Admitting: Family Medicine

## 2016-09-16 DIAGNOSIS — R69 Illness, unspecified: Secondary | ICD-10-CM | POA: Diagnosis not present

## 2016-09-17 ENCOUNTER — Encounter: Payer: Self-pay | Admitting: Gastroenterology

## 2016-09-17 ENCOUNTER — Ambulatory Visit (INDEPENDENT_AMBULATORY_CARE_PROVIDER_SITE_OTHER): Payer: Medicare HMO | Admitting: Gastroenterology

## 2016-09-17 VITALS — BP 102/80 | HR 104 | Ht 63.0 in | Wt 122.0 lb

## 2016-09-17 DIAGNOSIS — R1310 Dysphagia, unspecified: Secondary | ICD-10-CM | POA: Diagnosis not present

## 2016-09-17 NOTE — Patient Instructions (Signed)
You will be set up for an upper endoscopy for dysphagia with balloon dilation.

## 2016-09-17 NOTE — Progress Notes (Signed)
Review of pertinent gastrointestinal problems: 1. Routine risk for colon cancer: colonsocopy Dr. Ardis Hughs 2009 was normal. Repeat colonoscopy Dr. Ardis Hughs 10/2013 normal except single diminutive polyp that was removed, not retrieved, recommended recall in 10 years. 2. Mild gastritis, EGD Dr. Ardis Hughs 10/2013; biopsies show reactive gastropathy, no H pylori. Also manilia esophagititis, treated with diflucan empirically. 3. Dysphagia from recurrent yeast infections: 11/15 EGD Dr. Ardis Hughs found [place], tortuous esophagus, again candida esophagitis, treated with diflucan.  Office visit April 2016 she reported that she has always had trouble with used infections after antibiotics. Even when she was a little girl. She will get vaginal infections, abdominal wall infections. Lately she has had trouble with esophageal yeast infections. EGD 07/2016 Dr. Carlean Purl showed candida esophagitis again and also Schatzki's ring was dilated with scope passage (78m up to 14-139m.  Treated fungal infection again.    HPI: This is a  very pleasant 56ear old woman whom I last saw couple years ago.   Chief complaint is persistent dysphagia  She was recently hospitalized and during that hospitalization one of my partners Dr. GeCarlean Purlaw her for her persistent dysphagia. He performed EGD. Those results are summarized above.  Since EGD and hosp she's been swallowing better. Still has to be very careful because will intermittently have dysphagia still.  ROS: complete GI ROS as described in HPI.  Constitutional:  No unintentional weight loss   Past Medical History:  Diagnosis Date  . Allergy    SEASONAL  . Anemia   . Anxiety   . Arthritis   . ASD (atrial septal defect)    a. s/p ASD repair in 1973  . CAD (coronary artery disease)    a. 12/2015: NSTEMI occurring after cardiac arrest secondary to aspiration PNA. Echo w/ EF of 55-60%, no WMA. Outpt ischemic eval needed.  . Cardiomyopathy   . Deaf   . Depression   . Dysphagia     due to esophageal infections.  . Fibromyalgia   . Gastritis   . GERD (gastroesophageal reflux disease)   . Hearing impairment   . Hypertension    [name], take htn medication to regulate heart beat.  . Migraine   . Osteoporosis   . [place]   occured post c-section of her daughter  . Sleep apnea   . Vitamin D deficiency     Past Surgical History:  Procedure Laterality Date  . APPENDECTOMY    . CARDIAC SURGERY  January 2007   mitral valve repair  . CARDIAC SURGERY  1973   atrial septal defect  . CARDIAC SURGERY    . COCHLEAR IMPLANT    . ESOPHAGOGASTRODUODENOSCOPY (EGD) WITH PROPOFOL N/A 07/20/2016   Procedure: ESOPHAGOGASTRODUODENOSCOPY (EGD) WITH PROPOFOL;  Surgeon: CaGatha MayerMD;  [facility] Service: Endoscopy;  Laterality: N/A;  . INNER EAR SURGERY     TUBES  . LAPAROSCOPIC CHOLECYSTECTOMY  2012  . TONSILLECTOMY AND ADENOIDECTOMY    . TUBAL LIGATION      Current Outpatient Prescriptions  Medication Sig Dispense Refill  . aspirin 81 MG chewable tablet Chew 81 mg by mouth daily.    . Marland Kitchentorvastatin (LIPITOR) 40 MG tablet Take 1 tablet (40 mg total) by mouth daily at 6 PM. 90 tablet 1  . blood glucose meter kit and supplies KIT Dispense based on patient and insurance preference. Use up to SIX times daily as directed. (FOR ICD-10 e16.21). 1 each 0  . Cholecalciferol (VITAMIN D3) 2000 units TABS Take 2,000 Units  by mouth daily.     . fexofenadine (ALLEGRA ALLERGY) 180 MG tablet Take 180 mg by mouth daily.    . fluconazole (DIFLUCAN) 100 MG tablet Take 1 tablet (100 mg total) by mouth daily. 20 tablet 0  . fluticasone (FLONASE) 50 MCG/ACT nasal spray Place 2 sprays into both nostrils daily.    . metoprolol tartrate (LOPRESSOR) 25 MG tablet Take 1 tablet (25 mg total) by mouth 2 (two) times daily. 180 tablet 3  . nitroGLYCERIN (NITROSTAT) 0.4 MG SL tablet Place 1 tablet (0.4 mg total) under the tongue every 5 (five) minutes as needed for chest  pain. 30 tablet 0  . Nutritional Supplements (HIGH-PROTEIN NUTRITIONAL SHAKE) LIQD Drink between meals three times a day. Ok to use brand and flavor of pt's choice. No ingredient restrictions. 90 Bottle 2  . Omega-3 Fatty Acids (FISH OIL) 1000 MG CAPS Take 1,000 mg by mouth 2 (two) times daily.     . ondansetron (ZOFRAN ODT) 4 MG disintegrating tablet Take 1 tablet (4 mg total) by mouth every 8 (eight) hours as needed for nausea. 6 tablet 0  . pantoprazole (PROTONIX) 40 MG tablet Take 1 tablet (40 mg total) by mouth 2 (two) times daily before a meal. 60 tablet 1  . prazosin (MINIPRESS) 2 MG capsule Take 2 mg by mouth at bedtime.     [name] PRESCRIPTION MEDICATION Inhale into the lungs at bedtime. CPAP    . Vilazodone HCl (VIIBRYD) 40 MG TABS Take 40 mg by mouth daily.    . vitamin B-12 (CYANOCOBALAMIN) 500 MCG tablet Take 500 mcg by mouth daily.     No current facility-administered medications for this visit.     Allergies as of 09/17/2016 - Review Complete 09/17/2016  Allergen Reaction Noted  . Depakote [divalproex sodium] Anaphylaxis 07/01/2012  . Diazepam Other (See Comments) 09/03/2015  . Valium Anaphylaxis 09/23/2010  . Tizanidine hcl Other (See Comments) 12/06/2015    Family History  Problem Relation Age of Onset  . Breast cancer Mother        bilateral; ages 49 and 25; TAH/BSO ~50  . Depression Sister   . Heart disease Father   . Hypertension Father   . Heart attack Father   . Colon cancer Neg Hx   . Esophageal cancer Neg Hx   . Pancreatic cancer Neg Hx   . Rectal cancer Neg Hx   . Stomach cancer Neg Hx     Social History   Social History  . Marital status: Single    Spouse name: N/A  . Number of children: N/A  . Years of education: N/A   Occupational History  . Not on file.   Social History Main Topics  . Smoking status: Never Smoker  . Smokeless tobacco: Never Used  . 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  . Drug  use: No  . Sexual activity: Yes    Birth control/ protection: Surgical     Comment: 1st intercourse- 19, partners- 6   Other Topics Concern  . Not on file   Social History Narrative   Lives with 16 year old daughter.         Physical Exam: Ht _0  (1.6 m)   Wt 122 lb (55.3 kg)   BMI 21.61 kg/m  Constitutional: generally well-appearing Psychiatric: alert and oriented x3 Abdomen: soft, nontender, nondistended, no obvious ascites, no peritoneal signs, normal bowel sounds No peripheral edema noted in  lower extremities  Assessment and plan: 56 y.o. female with persistent dysphagia, chronic Candida esophagitis, Schatzki's ring  She is better since EGD with scope dilation 1-2 months ago. She still has intermittent difficulties. I recommended we repeat EGD and I will plan to perform balloon dilation. I think will also be interesting to check to see if she again has monilia esophagitis.  Please see the "Patient Instructions" section for addition details about the plan.  Owens Loffler, MD [facility] 09/17/2016, 11:00 AM

## 2016-09-20 ENCOUNTER — Emergency Department (HOSPITAL_COMMUNITY)
Admission: EM | Admit: 2016-09-20 | Discharge: 2016-09-20 | Disposition: A | Discharge status: Home / Self Care | Payer: Medicare HMO | Attending: Emergency Medicine | Admitting: Emergency Medicine

## 2016-09-20 ENCOUNTER — Encounter (HOSPITAL_COMMUNITY): Payer: Self-pay

## 2016-09-20 DIAGNOSIS — F1010 Alcohol abuse, uncomplicated: Secondary | ICD-10-CM

## 2016-09-20 DIAGNOSIS — K860 Alcohol-induced chronic pancreatitis: Secondary | ICD-10-CM | POA: Insufficient documentation

## 2016-09-20 DIAGNOSIS — R112 Nausea with vomiting, unspecified: Secondary | ICD-10-CM | POA: Insufficient documentation

## 2016-09-20 DIAGNOSIS — I2510 Atherosclerotic heart disease of native coronary artery without angina pectoris: Secondary | ICD-10-CM | POA: Insufficient documentation

## 2016-09-20 DIAGNOSIS — Z7982 Long term (current) use of aspirin: Secondary | ICD-10-CM | POA: Diagnosis not present

## 2016-09-20 DIAGNOSIS — I10 Essential (primary) hypertension: Secondary | ICD-10-CM | POA: Insufficient documentation

## 2016-09-20 DIAGNOSIS — K852 Alcohol induced acute pancreatitis without necrosis or infection: Secondary | ICD-10-CM | POA: Diagnosis not present

## 2016-09-20 DIAGNOSIS — I252 Old myocardial infarction: Secondary | ICD-10-CM | POA: Diagnosis not present

## 2016-09-20 DIAGNOSIS — Z79899 Other long term (current) drug therapy: Secondary | ICD-10-CM | POA: Diagnosis not present

## 2016-09-20 DIAGNOSIS — R69 Illness, unspecified: Secondary | ICD-10-CM | POA: Diagnosis not present

## 2016-09-20 LAB — RAPID URINE DRUG SCREEN, HOSP PERFORMED
Amphetamines: NOT DETECTED
Barbiturates: NOT DETECTED
Benzodiazepines: NOT DETECTED
COCAINE: NOT DETECTED
Opiates: NOT DETECTED
TETRAHYDROCANNABINOL: NOT DETECTED

## 2016-09-20 LAB — URINALYSIS, ROUTINE W REFLEX MICROSCOPIC
GLUCOSE, UA: NEGATIVE mg/dL
KETONES UR: 80 mg/dL — AB
Leukocytes, UA: NEGATIVE
NITRITE: NEGATIVE
PH: 6 (ref 5.0–8.0)
Protein, ur: 100 mg/dL — AB
RBC / HPF: NONE SEEN RBC/hpf (ref 0–5)
Specific Gravity, Urine: 1.025 (ref 1.005–1.030)

## 2016-09-20 LAB — LIPASE, BLOOD: Lipase: 60 U/L — ABNORMAL HIGH (ref 11–51)

## 2016-09-20 LAB — COMPREHENSIVE METABOLIC PANEL
ALK PHOS: 105 U/L (ref 38–126)
ALT: 77 U/L — ABNORMAL HIGH (ref 14–54)
ANION GAP: 23 — AB (ref 5–15)
AST: 166 U/L — ABNORMAL HIGH (ref 15–41)
Albumin: 4.6 g/dL (ref 3.5–5.0)
BILIRUBIN TOTAL: 2.7 mg/dL — AB (ref 0.3–1.2)
BUN: 11 mg/dL (ref 6–20)
CALCIUM: 8.9 mg/dL (ref 8.9–10.3)
CO2: 19 mmol/L — ABNORMAL LOW (ref 22–32)
Chloride: 94 mmol/L — ABNORMAL LOW (ref 101–111)
Creatinine, Ser: 1.18 mg/dL — ABNORMAL HIGH (ref 0.44–1.00)
GFR calc non Af Amer: 51 mL/min — ABNORMAL LOW (ref >60)
GFR, EST AFRICAN AMERICAN: 59 mL/min — AB (ref >60)
Glucose, Bld: 76 mg/dL (ref 65–99)
Potassium: 4.1 mmol/L (ref 3.5–5.1)
SODIUM: 136 mmol/L (ref 135–145)
TOTAL PROTEIN: 7.3 g/dL (ref 6.5–8.1)

## 2016-09-20 LAB — CBC
HCT: 44 % (ref 36.0–46.0)
HEMOGLOBIN: 14.9 g/dL (ref 12.0–15.0)
MCH: 33.6 pg (ref 26.0–34.0)
MCHC: 33.9 g/dL (ref 30.0–36.0)
MCV: 99.1 fL (ref 78.0–100.0)
Platelets: 154 10*3/uL (ref 150–400)
RBC: 4.44 MIL/uL (ref 3.87–5.11)
RDW: 15.4 % (ref 11.5–15.5)
WBC: 3.1 10*3/uL — ABNORMAL LOW (ref 4.0–10.5)

## 2016-09-20 LAB — ETHANOL: Alcohol, Ethyl (B): <5 mg/dL (ref <5)

## 2016-09-20 MED ORDER — ONDANSETRON 4 MG PO TBDP: 4.0000 mg | ORAL_TABLET | Freq: Once | ORAL | Status: DC | PRN

## 2016-09-20 MED ORDER — ONDANSETRON 4 MG PO TBDP
ORAL_TABLET | ORAL | Status: AC
  Administered 2016-09-20: 4 mg
  Filled 2016-09-20: qty 1

## 2016-09-20 MED ORDER — CHLORDIAZEPOXIDE HCL 25 MG PO CAPS: ORAL_CAPSULE | ORAL | 0 refills | Status: DC

## 2016-09-20 MED ORDER — DEXTROSE 5 % AND 0.45 % NACL IV BOLUS
1000.0000 mL | Freq: Once | INTRAVENOUS | Status: AC
  Administered 2016-09-20: 1000 mL via INTRAVENOUS

## 2016-09-20 MED ORDER — ONDANSETRON HCL 4 MG/2ML IJ SOLN
4.0000 mg | Freq: Once | INTRAMUSCULAR | Status: AC
  Administered 2016-09-20: 4 mg via INTRAVENOUS
  Filled 2016-09-20: qty 2

## 2016-09-20 MED ORDER — MORPHINE SULFATE (PF) 4 MG/ML IV SOLN
4.0000 mg | Freq: Once | INTRAVENOUS | Status: AC
  Administered 2016-09-20: 4 mg via INTRAVENOUS
  Filled 2016-09-20: qty 1

## 2016-09-20 MED ORDER — HYDROCODONE-ACETAMINOPHEN 5-325 MG PO TABS: 1.0000 | ORAL_TABLET | Freq: Four times a day (QID) | ORAL | 0 refills | Status: DC | PRN

## 2016-09-20 MED ORDER — ONDANSETRON 4 MG PO TBDP: 4.0000 mg | ORAL_TABLET | Freq: Three times a day (TID) | ORAL | 0 refills | Status: DC | PRN

## 2016-09-20 MED ORDER — ACETAMINOPHEN 325 MG PO TABS
650.0000 mg | ORAL_TABLET | Freq: Once | ORAL | Status: AC
  Administered 2016-09-20: 650 mg via ORAL
  Filled 2016-09-20: qty 2

## 2016-09-20 NOTE — ED Triage Notes (Signed)
Pt is coming with complaints of extreme abdominal pain that started after stopping drinking Vodka. Pt reports one week of binging on Vodka with no eating. Pt stopped drinking yesterday afternoon. Last drink noted at 1600. Pt had dry heaves with this RN.

## 2016-09-20 NOTE — ED Provider Notes (Signed)
[facility] DEPT Provider Note   CSN: [id number] Arrival date & time: 09/20/16  1335     History   Chief Complaint Chief Complaint  Patient presents with  . Abdominal Pain  . Alcohol Intoxication    HPI [name] is a 56 y.o. female.  This a 56 year old female who has a history of alcoholism and binge drinking.  States that she was sober for several months and in the last week resumed drinking black, she stopped yesterday.  Now having abdominal pain consistent with her previous pancreatitis, nausea and vomiting.  She reports that she has not anything to eat in 4 days      Past Medical History:  Diagnosis Date  . Allergy    SEASONAL  . Anemia   . Anxiety   . Arthritis   . ASD (atrial septal defect)    a. s/p ASD repair in 1973  . CAD (coronary artery disease)    a. 12/2015: NSTEMI occurring after cardiac arrest secondary to aspiration PNA. Echo w/ EF of 55-60%, no WMA. Outpt ischemic eval needed.  . Cardiomyopathy   . Deaf   . Depression   . Dysphagia    due to esophageal infections.  . Fibromyalgia   . Gastritis   . GERD (gastroesophageal reflux disease)   . Hearing impairment   . Hypertension    [name], take htn medication to regulate heart beat.  . Migraine   . Osteoporosis   . Pulmonary embolism ([place])    occured post c-section of her daughter  . Sleep apnea   . Vitamin D deficiency     Patient Active Problem List   Diagnosis Date Noted  . Pancytopenia ([place])   . Candida infection, esophageal ([place])   . Esophageal ring, acquired   . Neurocardiogenic syncope 07/19/2016  . Leukocytopenia 07/19/2016  . Macrocytosis 07/19/2016  . Abdominal pain, epigastric   . Bradycardia   . Blepharitis of both eyes 02/03/2016  . Cardiac arrest ([place]) 12/22/2015  . Uncontrolled hypertension   . Aspiration pneumonia of right lower lobe due to gastric secretions ([place])   . Hypokalemia   . Pneumonia   . NSTEMI (non-ST elevated myocardial infarction) ([place])  12/19/2015  . Typical atrial flutter ([place])   . Pressure ulcer 12/18/2015  . Protein-calorie malnutrition, severe 12/18/2015  . Metabolic acidosis 74/82/7078  . Lactic acidosis 12/17/2015  . Leucocytosis 12/17/2015  . Dehydration 12/17/2015  . Starvation ketoacidosis 12/17/2015  . Alcohol abuse 12/17/2015  . Increased anion gap metabolic acidosis 67/54/4920  . Essential hypertension 12/06/2015  . Accidental drug overdose 10/26/2015  . Aspiration pneumonia ([place]) 10/23/2015  . Acute respiratory failure with hypoxia ([place]) 10/23/2015  . Acute kidney injury ([place]) 10/23/2015  . Transaminitis 10/23/2015  . Positive blood culture 10/23/2015  . Elevated troponin 10/23/2015  . Thrombocytopenia ([place]) 10/23/2015  . Normocytic anemia 10/23/2015  . Shock ([place]) 10/19/2015  . Major depressive disorder, recurrent episode, moderate ([place]) 09/18/2014  . Non-traumatic compression fracture of vertebral column with routine healing 07/17/2014  . Osteoporosis 07/17/2014  . H/O vitamin D deficiency 07/17/2014  . Odynophagia 07/09/2014  . Esophageal dysphagia 07/08/2014  . Chest pain 05/20/2014  . Nausea & vomiting 05/20/2014  . 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 Major depression, chronic ([place]) 03/29/2014  . Breath shortness 03/29/2014  . Hypercholesterolemia without hypertriglyceridemia 03/29/2014  . Fatigue 03/29/2014  . Classical migraine with intractable migraine 03/29/2014  . Disorder of mitral valve 03/29/2014  . Atypical migraine 03/29/2014  .  Atrial septal defect of fossa ovalis 03/29/2014  . HLD (hyperlipidemia) 03/29/2014  . Arthralgia of multiple joints 03/29/2014  . Anemia, iron deficiency 03/29/2014  . Fibrositis 03/29/2014  . Abdominal pain 12/20/2013  . Absolute anemia 09/20/2013  . Bronchitis 09/20/2013  . Vaginitis and vulvovaginitis 09/20/2013  . Family history of breast cancer 07/13/2013  . Palpitation 07/02/2013  . History of mitral  valve repair 07/02/2013  . Status post patch closure of ASD 07/02/2013  . Hyperlipidemia 07/02/2013  . OAB (overactive bladder) 06/13/2013  . Ovarian cyst, left 03/15/2013  . Vaginal lesion 03/15/2013  . Ovarian mass 03/15/2013  . Maternal DVT (deep vein thrombosis), history of 02/26/2013  . PMB (postmenopausal bleeding) 02/26/2013  . Alcohol dependence ([place]) 11/07/2012  . Unspecified vitamin D deficiency 08/17/2012  . L1 vertebral fracture ([place]) 08/01/2012  . Spinal compression fracture ([place]) 07/12/2012  . Menopausal state 07/12/2012  . Psoriasis 07/12/2012  . Severe episode of recurrent major depressive disorder ([place]) 12/07/2011  . Generalized anxiety disorder 12/07/2011  . Cardiomyopathy 03/16/2011  . Hearing impairment   . Migraine   . Arthritis     Past Surgical History:  Procedure Laterality Date  . APPENDECTOMY    . CARDIAC SURGERY  January 2007   mitral valve repair  . CARDIAC SURGERY  1973   atrial septal defect  . CARDIAC SURGERY    . COCHLEAR IMPLANT    . ESOPHAGOGASTRODUODENOSCOPY (EGD) WITH PROPOFOL N/A 07/20/2016   Procedure: ESOPHAGOGASTRODUODENOSCOPY (EGD) WITH PROPOFOL;  Surgeon: Gatha Mayer, MD;  Location: [facility];  Service: Endoscopy;  Laterality: N/A;  . INNER EAR SURGERY     TUBES  . LAPAROSCOPIC CHOLECYSTECTOMY  2012  . TONSILLECTOMY AND ADENOIDECTOMY    . TUBAL LIGATION      OB History    Gravida Para Term Preterm AB Living   '2 2 2 ' 0 0     SAB TAB Ectopic Multiple Live Births   0 0 0           Home Medications    Prior to Admission medications   Medication Sig Start Date End Date Taking? Authorizing Provider  aspirin 81 MG chewable tablet Chew 81 mg by mouth daily.    [provider]  atorvastatin (LIPITOR) 40 MG tablet Take 1 tablet (40 mg total) by mouth daily at 6 PM. 02/02/16   [name], MD  blood glucose meter kit and supplies KIT Dispense based on patient and insurance preference. Use up to SIX times daily as  directed. (FOR ICD-10 e16.21). 08/13/16   [name], MD  chlordiazePOXIDE (LIBRIUM) 25 MG capsule 8m PO TID x 1D, then 25-586mPO BID X 1D, then 25-5019mO QD X 1D 09/20/16   SchJunius CreamerP  Cholecalciferol (VITAMIN D3) 2000 units TABS Take 2,000 Units by mouth daily.     [provider]  fexofenadine (ALLEGRA ALLERGY) 180 MG tablet Take 180 mg by mouth daily.    [provider]  fluconazole (DIFLUCAN) 100 MG tablet Take 1 tablet (100 mg total) by mouth daily. 07/22/16   AmiLovenia KimD  fluticasone (FLONASE) 50 MCG/ACT nasal spray Place 2 sprays into both nostrils daily.    [provider]  HYDROcodone-acetaminophen (NORCO/VICODIN) 5-325 MG tablet Take 1 tablet by mouth every 6 (six) hours as needed. 09/20/16   SchJunius CreamerP  metoprolol tartrate (LOPRESSOR) 25 MG tablet Take 1 tablet (25 mg total) by mouth 2 (two) times daily. 07/22/16   ShaDelman Cheadle  N, MD  nitroGLYCERIN (NITROSTAT) 0.4 MG SL tablet Place 1 tablet (0.4 mg total) under the tongue every 5 (five) minutes as needed for chest pain. 05/11/16   [name], MD  Nutritional Supplements (HIGH-PROTEIN NUTRITIONAL SHAKE) LIQD Drink between meals three times a day. Ok to use brand and flavor of pt's choice. No ingredient restrictions. 07/22/16   [name], MD  Omega-3 Fatty Acids (FISH OIL) 1000 MG CAPS Take 1,000 mg by mouth 2 (two) times daily.     [provider]  ondansetron (ZOFRAN ODT) 4 MG disintegrating tablet Take 1 tablet (4 mg total) by mouth every 8 (eight) hours as needed for nausea. 11/08/15   [name], MD  ondansetron (ZOFRAN ODT) 4 MG disintegrating tablet Take 1 tablet (4 mg total) by mouth every 8 (eight) hours as needed for nausea or vomiting. 09/20/16   Junius Creamer, NP  pantoprazole (PROTONIX) 40 MG tablet Take 1 tablet (40 mg total) by mouth 2 (two) times daily before a meal. 07/22/16   [name], MD  prazosin (MINIPRESS) 2 MG capsule Take 2 mg by mouth at bedtime.  02/17/16   [provider]  PRESCRIPTION MEDICATION Inhale into the lungs at bedtime. CPAP    [provider]  Vilazodone HCl (VIIBRYD) 40 MG TABS Take 40 mg by mouth daily.    [provider]  vitamin B-12 (CYANOCOBALAMIN) 500 MCG tablet Take 500 mcg by mouth daily.    [provider]    Family History Family History  Problem Relation Age of Onset  . Breast cancer Mother        bilateral; ages 61 and 10; TAH/BSO ~50  . Depression Sister   . Heart disease Father   . Hypertension Father   . Heart attack Father   . Colon cancer Neg Hx   . Esophageal cancer Neg Hx   . Pancreatic cancer Neg Hx   . Rectal cancer Neg Hx   . Stomach cancer Neg Hx     Social History Social History  Substance Use Topics  . Smoking status: Never Smoker  . Smokeless tobacco: Never Used  . 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     Allergies   Depakote [divalproex sodium]; Diazepam; Valium; and Tizanidine hcl   Review of Systems Review of Systems  Constitutional: Negative for fever.  Respiratory: Negative for shortness of breath.   Cardiovascular: Negative for chest pain.  Gastrointestinal: Positive for abdominal pain, nausea and vomiting.  Psychiatric/Behavioral: Negative for suicidal ideas.  All other systems reviewed and are negative.    Physical Exam Updated Vital Signs BP 138/84   Pulse 77   Temp 98.1 F (36.7 C) (Oral)   Resp 18   Ht '5\' 3"'  (1.6 m)   Wt 55.3 kg   SpO2 100%   BMI 21.61 kg/m   Physical Exam  Constitutional: She appears well-developed and well-nourished. No distress.  HENT:  Head: Normocephalic.  Eyes: Pupils are equal, round, and reactive to light.  Neck: Normal range of motion.  Cardiovascular: Normal rate.   Pulmonary/Chest: Effort normal.  Abdominal: Soft. Bowel sounds are normal. There is tenderness in the epigastric area and left upper quadrant. There is no rebound and no guarding.    Musculoskeletal: Normal range of motion.  Neurological: She is alert.  Skin: Skin is warm.  Nursing note and vitals reviewed.    ED Treatments /  Results  Labs (all labs ordered are listed, but only abnormal results are displayed) Labs Reviewed  LIPASE, BLOOD - Abnormal; Notable for the following:       Result Value   Lipase 60 (*)    All other components within normal limits  COMPREHENSIVE METABOLIC PANEL - Abnormal; Notable for the following:    Chloride 94 (*)    CO2 19 (*)    Creatinine, Ser 1.18 (*)    AST 166 (*)    ALT 77 (*)    Total Bilirubin 2.7 (*)    GFR calc non Af Amer 51 (*)    GFR calc Af Amer 59 (*)    Anion gap 23 (*)    All other components within normal limits  CBC - Abnormal; Notable for the following:    WBC 3.1 (*)    All other components within normal limits  URINALYSIS, ROUTINE W REFLEX MICROSCOPIC - Abnormal; Notable for the following:    Color, Urine AMBER (*)    APPearance HAZY (*)    Hgb urine dipstick SMALL (*)    Bilirubin Urine SMALL (*)    Ketones, ur 80 (*)    Protein, ur 100 (*)    Bacteria, UA RARE (*)    Squamous Epithelial / LPF 0-5 (*)    All other components within normal limits  ETHANOL  RAPID URINE DRUG SCREEN, HOSP PERFORMED    EKG  EKG Interpretation None       Radiology No results found.  Procedures Procedures (including critical care time)  Medications Ordered in ED Medications  ondansetron (ZOFRAN-ODT) 4 MG disintegrating tablet (4 mg  Given 09/20/16 1407)  dextrose 5 % and 0.45% NaCl 5-0.45 % bolus 1,000 mL (0 mLs Intravenous Stopped 09/20/16 2317)  morphine 4 MG/ML injection 4 mg (4 mg Intravenous Given 09/20/16 2149)  ondansetron (ZOFRAN) injection 4 mg (4 mg Intravenous Given 09/20/16 2149)  acetaminophen (TYLENOL) tablet 650 mg (650 mg Oral Given 09/20/16 2243)     Initial Impression / Assessment and Plan / ED Course  I have reviewed the triage vital signs and the nursing notes.  Pertinent labs &  imaging results that were available during my care of the patient were reviewed by me and considered in my medical decision making (see chart for details).      Patient has gotten IV fluids, IV, Ativan for nausea and anxiety and possible withdrawal symptoms, and morphine for pain She is feeling significantly better and requesting food.  Final Clinical Impressions(s) / ED Diagnoses   Final diagnoses:  Alcohol abuse  Alcohol-induced chronic pancreatitis (HCC)  Non-intractable vomiting with nausea, unspecified vomiting type    New Prescriptions Discharge Medication List as of 09/20/2016 11:44 PM    START taking these medications   Details  chlordiazePOXIDE (LIBRIUM) 25 MG capsule 97m PO TID x 1D, then 25-565mPO BID X 1D, then 25-5031mO QD X 1D, Print    HYDROcodone-acetaminophen (NORCO/VICODIN) 5-325 MG tablet Take 1 tablet by mouth every 6 (six) hours as needed., Starting Mon 09/20/2016, Print    !! ondansetron (ZOFRAN ODT) 4 MG disintegrating tablet Take 1 tablet (4 mg total) by mouth every 8 (eight) hours as needed for nausea or vomiting., Starting Mon 09/20/2016, Print     !! - Potential duplicate medications found. Please discuss with provider.       SchJunius CreamerP 09/21/16 1950    KnaDorie RankD 09/22/16 1332

## 2016-09-20 NOTE — Discharge Instructions (Signed)
Good luck with your detox from alcohol and staying sober.  You've been given prescriptions for Zofran to control nausea.  Librium to control your symptoms of a poor withdrawal and Vicodin for pain as needed. Please try to eat small amounts frequently.  A very lightly for the next 2 days, then resume your normal diet

## 2016-09-22 ENCOUNTER — Encounter: Payer: Self-pay | Admitting: Gynecology

## 2016-09-26 DIAGNOSIS — S42002A Fracture of unspecified part of left clavicle, initial encounter for closed fracture: Secondary | ICD-10-CM | POA: Diagnosis not present

## 2016-09-27 DIAGNOSIS — S42025A Nondisplaced fracture of shaft of left clavicle, initial encounter for closed fracture: Secondary | ICD-10-CM | POA: Diagnosis not present

## 2016-09-28 ENCOUNTER — Ambulatory Visit (INDEPENDENT_AMBULATORY_CARE_PROVIDER_SITE_OTHER): Payer: Medicare HMO | Admitting: Physician Assistant

## 2016-09-28 ENCOUNTER — Encounter: Payer: Self-pay | Admitting: Physician Assistant

## 2016-09-28 VITALS — BP 100/70 | HR 110 | Temp 99.5°F | Resp 16 | Wt 127.0 lb

## 2016-09-28 DIAGNOSIS — R000 Tachycardia, unspecified: Secondary | ICD-10-CM

## 2016-09-28 DIAGNOSIS — R509 Fever, unspecified: Secondary | ICD-10-CM | POA: Diagnosis not present

## 2016-09-28 LAB — POCT URINALYSIS DIP (MANUAL ENTRY)
GLUCOSE UA: NEGATIVE mg/dL
Leukocytes, UA: NEGATIVE
Nitrite, UA: NEGATIVE
Protein Ur, POC: 30 mg/dL — AB
Spec Grav, UA: 1.025 (ref 1.010–1.025)
UROBILINOGEN UA: 1 U/dL
pH, UA: 5.5 (ref 5.0–8.0)

## 2016-09-28 LAB — POCT CBC
Granulocyte percent: 74.9 %G (ref 37–80)
HCT, POC: 31.8 % — AB (ref 37.7–47.9)
HEMOGLOBIN: 11.1 g/dL — AB (ref 12.2–16.2)
LYMPH, POC: 0.7 (ref 0.6–3.4)
MCH, POC: 33.8 pg — AB (ref 27–31.2)
MCHC: 34.9 g/dL (ref 31.8–35.4)
MCV: 97 fL (ref 80–97)
MID (cbc): 0.2 (ref 0–0.9)
MPV: 8.5 fL (ref 0–99.8)
POC Granulocyte: 2.5 (ref 2–6.9)
POC LYMPH PERCENT: 19.3 %L (ref 10–50)
POC MID %: 5.8 %M (ref 0–12)
Platelet Count, POC: 147 10*3/uL (ref 142–424)
RBC: 3.27 M/uL — AB (ref 4.04–5.48)
RDW, POC: 16.7 %
WBC: 3.4 10*3/uL — AB (ref 4.6–10.2)

## 2016-09-28 LAB — POCT INFLUENZA A/B
INFLUENZA A, POC: NEGATIVE
Influenza B, POC: NEGATIVE

## 2016-09-28 MED ORDER — OMEPRAZOLE 20 MG PO CPDR: 20.0000 mg | DELAYED_RELEASE_CAPSULE | Freq: Every day | ORAL | 3 refills | Status: DC

## 2016-09-28 MED ORDER — IBUPROFEN 200 MG PO TABS
400.0000 mg | ORAL_TABLET | Freq: Once | ORAL | Status: AC
  Administered 2016-09-28: 400 mg via ORAL

## 2016-09-28 NOTE — Progress Notes (Signed)
PRIMARY CARE AT [place], [place] [address] [phone]  Date:  09/28/2016   Name:  [name]   DOB:  December 10, 1960   MRN:  [id number]  PCP:  [name], MD    History of Present Illness:  [name] is a 56 y.o. female patient who presents to PCP with  Chief Complaint  Patient presents with  . Fever    per patient, temp was 101.3 this morning, did not take any fever reducer     Last night, she was having fevers.  101.8 fever.  She has some ear pain and radiating shoulder pain radiating to her ear.  Neck is hurting.  She has no coughing.her urine is normal--no dysuria, frequency, hematuria.  No diarrhea or loose stool.   She fell while she was holding a bowl of salad.  Hit her shoulder on the end table.  Badly bruised and progressively worsened.  She went to [facility] 2 days ago.  Noted that her shoulder was fractured left shoulder.  This is followed by [facility].   Hx of PE 14 years ago, after cesarian section.    Patient Active Problem List   Diagnosis Date Noted  . Pancytopenia ([place])   . Candida infection, esophageal ([place])   . Esophageal ring, acquired   . Neurocardiogenic syncope 07/19/2016  . Leukocytopenia 07/19/2016  . Macrocytosis 07/19/2016  . Abdominal pain, epigastric   . Bradycardia   . Blepharitis of both eyes 02/03/2016  . Cardiac arrest ([place]) 12/22/2015  . Uncontrolled hypertension   . Aspiration pneumonia of right lower lobe due to gastric secretions ([place])   . Hypokalemia   . Pneumonia   . NSTEMI (non-ST elevated myocardial infarction) ([place]) 12/19/2015  . Typical atrial flutter ([place])   . Pressure ulcer 12/18/2015  . Protein-calorie malnutrition, severe 12/18/2015  . Metabolic acidosis 59/93/5701  . Lactic acidosis 12/17/2015  . Leucocytosis 12/17/2015  . Dehydration 12/17/2015  . Starvation ketoacidosis 12/17/2015  . Alcohol abuse 12/17/2015  . Increased anion gap metabolic acidosis 77/93/9030  . Essential hypertension  12/06/2015  . Accidental drug overdose 10/26/2015  . Aspiration pneumonia ([place]) 10/23/2015  . Acute respiratory failure with hypoxia ([place]) 10/23/2015  . Acute kidney injury ([place]) 10/23/2015  . Transaminitis 10/23/2015  . Positive blood culture 10/23/2015  . Elevated troponin 10/23/2015  . Thrombocytopenia ([place]) 10/23/2015  . Normocytic anemia 10/23/2015  . Shock ([place]) 10/19/2015  . Major depressive disorder, recurrent episode, moderate ([place]) 09/18/2014  . Non-traumatic compression fracture of vertebral column with routine healing 07/17/2014  . Osteoporosis 07/17/2014  . H/O vitamin D deficiency 07/17/2014  . Odynophagia 07/09/2014  . Esophageal dysphagia 07/08/2014  . Chest pain 05/20/2014  . Nausea & vomiting 05/20/2014  . 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 Major depression, chronic ([place]) 03/29/2014  . Breath shortness 03/29/2014  . Hypercholesterolemia without hypertriglyceridemia 03/29/2014  . Fatigue 03/29/2014  . Classical migraine with intractable migraine 03/29/2014  . Disorder of mitral valve 03/29/2014  . Atypical migraine 03/29/2014  . Atrial septal defect of fossa ovalis 03/29/2014  . HLD (hyperlipidemia) 03/29/2014  . Arthralgia of multiple joints 03/29/2014  . Anemia, iron deficiency 03/29/2014  . Fibrositis 03/29/2014  . Abdominal pain 12/20/2013  . Absolute anemia 09/20/2013  . Bronchitis 09/20/2013  . Vaginitis and vulvovaginitis 09/20/2013  . Family history of breast cancer 07/13/2013  . Palpitation 07/02/2013  . History of mitral valve repair 07/02/2013  . Status post patch closure of  ASD 07/02/2013  . Hyperlipidemia 07/02/2013  . OAB (overactive bladder) 06/13/2013  . Ovarian cyst, left 03/15/2013  . Vaginal lesion 03/15/2013  . Ovarian mass 03/15/2013  . Maternal DVT (deep vein thrombosis), history of 02/26/2013  . PMB (postmenopausal bleeding) 02/26/2013  . Alcohol dependence ([place]) 11/07/2012  . Unspecified  vitamin D deficiency 08/17/2012  . L1 vertebral fracture ([place]) 08/01/2012  . Spinal compression fracture ([place]) 07/12/2012  . Menopausal state 07/12/2012  . Psoriasis 07/12/2012  . Severe episode of recurrent major depressive disorder ([place]) 12/07/2011  . Generalized anxiety disorder 12/07/2011  . Cardiomyopathy 03/16/2011  . Hearing impairment   . Migraine   . Arthritis     Past Medical History:  Diagnosis Date  . Allergy    SEASONAL  . Anemia   . Anxiety   . Arthritis   . ASD (atrial septal defect)    a. s/p ASD repair in 1973  . CAD (coronary artery disease)    a. 12/2015: NSTEMI occurring after cardiac arrest secondary to aspiration PNA. Echo w/ EF of 55-60%, no WMA. Outpt ischemic eval needed.  . Cardiomyopathy   . Deaf   . Depression   . Dysphagia    due to esophageal infections.  . Fibromyalgia   . Gastritis   . GERD (gastroesophageal reflux disease)   . Hearing impairment   . Hypertension    [name], take htn medication to regulate heart beat.  . Migraine   . Osteoporosis   . Pulmonary embolism ([place])    occured post c-section of her daughter  . Sleep apnea   . Vitamin D deficiency     Past Surgical History:  Procedure Laterality Date  . APPENDECTOMY    . CARDIAC SURGERY  January 2007   mitral valve repair  . CARDIAC SURGERY  1973   atrial septal defect  . CARDIAC SURGERY    . COCHLEAR IMPLANT    . ESOPHAGOGASTRODUODENOSCOPY (EGD) WITH PROPOFOL N/A 07/20/2016   Procedure: ESOPHAGOGASTRODUODENOSCOPY (EGD) WITH PROPOFOL;  Surgeon: Gatha Mayer, MD;  Location: [facility];  Service: Endoscopy;  Laterality: N/A;  . INNER EAR SURGERY     TUBES  . LAPAROSCOPIC CHOLECYSTECTOMY  2012  . TONSILLECTOMY AND ADENOIDECTOMY    . TUBAL LIGATION      Social History  Substance Use Topics  . Smoking status: Never Smoker  . Smokeless tobacco: Never Used  . 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    Family History   Problem Relation Age of Onset  . Breast cancer Mother        bilateral; ages 32 and 41; TAH/BSO ~50  . Depression Sister   . Heart disease Father   . Hypertension Father   . Heart attack Father   . Colon cancer Neg Hx   . Esophageal cancer Neg Hx   . Pancreatic cancer Neg Hx   . Rectal cancer Neg Hx   . Stomach cancer Neg Hx     Allergies  Allergen Reactions  . Depakote [Divalproex Sodium] Anaphylaxis  . Diazepam Other (See Comments)    Died on operating table  . Valium Anaphylaxis  . Tizanidine Hcl Other (See Comments)    "passed out"    Medication list has been reviewed and updated.  Current Outpatient Prescriptions on File Prior to Visit  Medication Sig Dispense Refill  . aspirin 81 MG chewable tablet Chew 81 mg by mouth daily.    [name]  atorvastatin (LIPITOR) 40 MG tablet Take 1 tablet (40 mg total) by mouth daily at 6 PM. 90 tablet 1  . blood glucose meter kit and supplies KIT Dispense based on patient and insurance preference. Use up to SIX times daily as directed. (FOR ICD-10 e16.21). 1 each 0  . chlordiazePOXIDE (LIBRIUM) 25 MG capsule 37m PO TID x 1D, then 25-514mPO BID X 1D, then 25-5048mO QD X 1D 10 capsule 0  . Cholecalciferol (VITAMIN D3) 2000 units TABS Take 2,000 Units by mouth daily.     . fexofenadine (ALLEGRA ALLERGY) 180 MG tablet Take 180 mg by mouth daily.    . fluconazole (DIFLUCAN) 100 MG tablet Take 1 tablet (100 mg total) by mouth daily. 20 tablet 0  . fluticasone (FLONASE) 50 MCG/ACT nasal spray Place 2 sprays into both nostrils daily.    . HMarland KitchenDROcodone-acetaminophen (NORCO/VICODIN) 5-325 MG tablet Take 1 tablet by mouth every 6 (six) hours as needed. 10 tablet 0  . metoprolol tartrate (LOPRESSOR) 25 MG tablet Take 1 tablet (25 mg total) by mouth 2 (two) times daily. 180 tablet 3  . nitroGLYCERIN (NITROSTAT) 0.4 MG SL tablet Place 1 tablet (0.4 mg total) under the tongue every 5 (five) minutes as needed for chest pain. 30 tablet 0  . Nutritional  Supplements (HIGH-PROTEIN NUTRITIONAL SHAKE) LIQD Drink between meals three times a day. Ok to use brand and flavor of pt's choice. No ingredient restrictions. 90 Bottle 2  . Omega-3 Fatty Acids (FISH OIL) 1000 MG CAPS Take 1,000 mg by mouth 2 (two) times daily.     . ondansetron (ZOFRAN ODT) 4 MG disintegrating tablet Take 1 tablet (4 mg total) by mouth every 8 (eight) hours as needed for nausea. 6 tablet 0  . ondansetron (ZOFRAN ODT) 4 MG disintegrating tablet Take 1 tablet (4 mg total) by mouth every 8 (eight) hours as needed for nausea or vomiting. 20 tablet 0  . pantoprazole (PROTONIX) 40 MG tablet Take 1 tablet (40 mg total) by mouth 2 (two) times daily before a meal. 60 tablet 1  . prazosin (MINIPRESS) 2 MG capsule Take 2 mg by mouth at bedtime.     . PMarland KitchenESCRIPTION MEDICATION Inhale into the lungs at bedtime. CPAP    . Vilazodone HCl (VIIBRYD) 40 MG TABS Take 40 mg by mouth daily.    . vitamin B-12 (CYANOCOBALAMIN) 500 MCG tablet Take 500 mcg by mouth daily.     No current facility-administered medications on file prior to visit.     ROS ROS otherwise unremarkable unless listed above.  Physical Examination: BP 100/70 (BP Location: Right Arm, Patient Position: Sitting, Cuff Size: Normal)   Pulse (!) 110   Temp 99.5 F (37.5 C) (Oral)   Resp 16   Wt 127 lb (57.6 kg)   SpO2 97%   BMI 22.50 kg/m  Ideal Body Weight:    Physical Exam  Constitutional: She is oriented to person, place, and time. She appears well-developed and well-nourished. No distress.  HENT:  Head: Normocephalic and atraumatic.  Right Ear: External ear normal.  Left Ear: External ear normal.  No erythema, bulge, or tenderness.  Eyes: Conjunctivae and EOM are normal. Pupils are equal, round, and reactive to light.  Cardiovascular: Regular rhythm.  Tachycardia present.   Pulses:      Radial pulses are 2+ on the right side, and 2+ on the left side.  Pulmonary/Chest: Effort normal. No accessory muscle usage. No  apnea. No respiratory distress. She has no decreased  breath sounds. She has no wheezes. She has no rhonchi.  Musculoskeletal:  Ecchymosis at the left anterior shoulder  Neurological: She is alert and oriented to person, place, and time.  Skin: She is not diaphoretic.  Psychiatric: She has a normal mood and affect. Her behavior is normal.   Results for orders placed or performed in visit on 09/28/16  POCT CBC  Result Value Ref Range   WBC 3.4 (A) 4.6 - 10.2 K/uL   Lymph, poc 0.7 0.6 - 3.4   POC LYMPH PERCENT 19.3 10 - 50 %L   MID (cbc) 0.2 0 - 0.9   POC MID % 5.8 0 - 12 %M   POC Granulocyte 2.5 2 - 6.9   Granulocyte percent 74.9 37 - 80 %G   RBC 3.27 (A) 4.04 - 5.48 M/uL   Hemoglobin 11.1 (A) 12.2 - 16.2 g/dL   HCT, POC 31.8 (A) 37.7 - 47.9 %   MCV 97.0 80 - 97 fL   MCH, POC 33.8 (A) 27 - 31.2 pg   MCHC 34.9 31.8 - 35.4 g/dL   RDW, POC 16.7 %   Platelet Count, POC 147 142 - 424 K/uL   MPV 8.5 0 - 99.8 fL  POCT Influenza A/B  Result Value Ref Range   Influenza A, POC Negative Negative   Influenza B, POC Negative Negative  POCT urinalysis dipstick  Result Value Ref Range   Color, UA yellow yellow   Clarity, UA clear clear   Glucose, UA negative negative mg/dL   Bilirubin, UA small (A) negative   Ketones, POC UA trace (5) (A) negative mg/dL   Spec Grav, UA 1.025 1.010 - 1.025   Blood, UA trace-intact (A) negative   pH, UA 5.5 5.0 - 8.0   Protein Ur, POC =30 (A) negative mg/dL   Urobilinogen, UA 1.0 0.2 or 1.0 E.U./dL   Nitrite, UA Negative Negative   Leukocytes, UA Negative Negative      Assessment and Plan: [name] is a 56 y.o. female who is here today for cc of fever. --concern of fever at home, though this has been negative here. --possibility of virus.  PE low on differential, however considered.  Advised to recheck temperature at home --if she has any sob, coughing, etc--she will return immediately or go toe ED --stat d-dimer likely positive given her  current fx.   --she will return in 2 days for recheck.  --advised aspirin 325 at this time.  Tachycardia - Plan: POCT CBC, ibuprofen (ADVIL,MOTRIN) tablet 400 mg, POCT Influenza A/B, POCT urinalysis dipstick, omeprazole (PRILOSEC) 20 MG capsule  Fever, unspecified - Plan: POCT CBC, ibuprofen (ADVIL,MOTRIN) tablet 400 mg, POCT Influenza A/B, POCT urinalysis dipstick, omeprazole (PRILOSEC) 20 MG capsule  Ivar Drape, PA-C [facility] 5/24/20181:04 PM

## 2016-09-28 NOTE — Patient Instructions (Signed)
     IF you received an x-ray today, you will receive an invoice from [facility]. Please contact  Radiology at [phone] with questions or concerns regarding your invoice.   IF you received labwork today, you will receive an invoice from [facility]. Please contact [facility] at [phone] with questions or concerns regarding your invoice.   Our billing staff will not be able to assist you with questions regarding bills from these companies.  You will be contacted with the lab results as soon as they are available. The fastest way to get your results is to activate your My Chart account. Instructions are located on the last page of this paperwork. If you have not heard from us regarding the results in 2 weeks, please contact this office.     

## 2016-09-30 ENCOUNTER — Encounter: Payer: Self-pay | Admitting: Physician Assistant

## 2016-09-30 ENCOUNTER — Ambulatory Visit (INDEPENDENT_AMBULATORY_CARE_PROVIDER_SITE_OTHER): Payer: Medicare HMO | Admitting: Physician Assistant

## 2016-09-30 VITALS — BP 94/62 | HR 104 | Temp 97.9°F | Resp 18 | Ht 63.0 in | Wt 129.0 lb

## 2016-09-30 DIAGNOSIS — Z09 Encounter for follow-up examination after completed treatment for conditions other than malignant neoplasm: Secondary | ICD-10-CM

## 2016-09-30 DIAGNOSIS — Z9189 Other specified personal risk factors, not elsewhere classified: Secondary | ICD-10-CM | POA: Diagnosis not present

## 2016-09-30 DIAGNOSIS — R69 Illness, unspecified: Secondary | ICD-10-CM | POA: Diagnosis not present

## 2016-09-30 NOTE — Patient Instructions (Signed)
     IF you received an x-ray today, you will receive an invoice from [facility]. Please contact [facility] at [phone] with questions or concerns regarding your invoice.   IF you received labwork today, you will receive an invoice from [facility]. Please contact [facility] at [phone] with questions or concerns regarding your invoice.   Our billing staff will not be able to assist you with questions regarding bills from these companies.  You will be contacted with the lab results as soon as they are available. The fastest way to get your results is to activate your My Chart account. Instructions are located on the last page of this paperwork. If you have not heard from us regarding the results in 2 weeks, please contact this office.     

## 2016-10-03 NOTE — Progress Notes (Signed)
PRIMARY CARE AT [place], [place] [address] [phone]  Date:  09/30/2016   Name:  [name]   DOB:  08-17-1960   MRN:  [id number]  PCP:  [name], MD    History of Present Illness:  [name] is a 56 y.o. female patient who presents to PCP with  Chief Complaint  Patient presents with  . Follow-up    left shoulder      Patient  is here for follow up of reported fever 4 days ago.  To note, she currently has a left shoulder fx that is followed with orthopaedics.  She notes no fevers since the reported incident.  No coughing, sob, urinary or respiratory symptoms.   following physical exam, abrasions found on back inconsistent with falling forward with salad bowl.  She reports using a back scratcher.   Patient Active Problem List   Diagnosis Date Noted  . Pancytopenia ([place])   . Candida infection, esophageal ([place])   . Esophageal ring, acquired   . Neurocardiogenic syncope 07/19/2016  . Leukocytopenia 07/19/2016  . Macrocytosis 07/19/2016  . Abdominal pain, epigastric   . Bradycardia   . Blepharitis of both eyes 02/03/2016  . Cardiac arrest ([place]) 12/22/2015  . Uncontrolled hypertension   . Aspiration pneumonia of right lower lobe due to gastric secretions ([place])   . Hypokalemia   . Pneumonia   . NSTEMI (non-ST elevated myocardial infarction) ([place]) 12/19/2015  . Typical atrial flutter ([place])   . Pressure ulcer 12/18/2015  . Protein-calorie malnutrition, severe 12/18/2015  . Metabolic acidosis 80/99/8338  . Lactic acidosis 12/17/2015  . Leucocytosis 12/17/2015  . Dehydration 12/17/2015  . Starvation ketoacidosis 12/17/2015  . Alcohol abuse 12/17/2015  . Increased anion gap metabolic acidosis 25/09/3974  . Essential hypertension 12/06/2015  . Accidental drug overdose 10/26/2015  . Aspiration pneumonia ([place]) 10/23/2015  . Acute respiratory failure with hypoxia ([place]) 10/23/2015  . Acute kidney injury ([place]) 10/23/2015  . Transaminitis 10/23/2015  .  Positive blood culture 10/23/2015  . Elevated troponin 10/23/2015  . Thrombocytopenia ([place]) 10/23/2015  . Normocytic anemia 10/23/2015  . Shock ([place]) 10/19/2015  . Major depressive disorder, recurrent episode, moderate ([place]) 09/18/2014  . Non-traumatic compression fracture of vertebral column with routine healing 07/17/2014  . Osteoporosis 07/17/2014  . H/O vitamin D deficiency 07/17/2014  . Odynophagia 07/09/2014  . Esophageal dysphagia 07/08/2014  . Chest pain 05/20/2014  . Nausea & vomiting 05/20/2014  . 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 Major depression, chronic ([place]) 03/29/2014  . Breath shortness 03/29/2014  . Hypercholesterolemia without hypertriglyceridemia 03/29/2014  . Fatigue 03/29/2014  . Classical migraine with intractable migraine 03/29/2014  . Disorder of mitral valve 03/29/2014  . Atypical migraine 03/29/2014  . Atrial septal defect of fossa ovalis 03/29/2014  . HLD (hyperlipidemia) 03/29/2014  . Arthralgia of multiple joints 03/29/2014  . Anemia, iron deficiency 03/29/2014  . Fibrositis 03/29/2014  . Abdominal pain 12/20/2013  . Absolute anemia 09/20/2013  . Bronchitis 09/20/2013  . Vaginitis and vulvovaginitis 09/20/2013  . Family history of breast cancer 07/13/2013  . Palpitation 07/02/2013  . History of mitral valve repair 07/02/2013  . Status post patch closure of ASD 07/02/2013  . Hyperlipidemia 07/02/2013  . OAB (overactive bladder) 06/13/2013  . Ovarian cyst, left 03/15/2013  . Vaginal lesion 03/15/2013  . Ovarian mass 03/15/2013  . Maternal DVT (deep vein thrombosis), history of 02/26/2013  . PMB (postmenopausal bleeding) 02/26/2013  . Alcohol dependence ([place])  11/07/2012  . Unspecified vitamin D deficiency 08/17/2012  . L1 vertebral fracture ([place]) 08/01/2012  . Spinal compression fracture ([place]) 07/12/2012  . Menopausal state 07/12/2012  . Psoriasis 07/12/2012  . Severe episode of recurrent major  depressive disorder ([place]) 12/07/2011  . Generalized anxiety disorder 12/07/2011  . Cardiomyopathy 03/16/2011  . Hearing impairment   . Migraine   . Arthritis     Past Medical History:  Diagnosis Date  . Allergy    SEASONAL  . Anemia   . Anxiety   . Arthritis   . ASD (atrial septal defect)    a. s/p ASD repair in 1973  . CAD (coronary artery disease)    a. 12/2015: NSTEMI occurring after cardiac arrest secondary to aspiration PNA. Echo w/ EF of 55-60%, no WMA. Outpt ischemic eval needed.  . Cardiomyopathy   . Deaf   . Depression   . Dysphagia    due to esophageal infections.  . Fibromyalgia   . Gastritis   . GERD (gastroesophageal reflux disease)   . Hearing impairment   . Hypertension    [name], take htn medication to regulate heart beat.  . Migraine   . Osteoporosis   . Pulmonary embolism ([place])    occured post c-section of her daughter  . Sleep apnea   . Vitamin D deficiency     Past Surgical History:  Procedure Laterality Date  . APPENDECTOMY    . CARDIAC SURGERY  January 2007   mitral valve repair  . CARDIAC SURGERY  1973   atrial septal defect  . CARDIAC SURGERY    . COCHLEAR IMPLANT    . ESOPHAGOGASTRODUODENOSCOPY (EGD) WITH PROPOFOL N/A 07/20/2016   Procedure: ESOPHAGOGASTRODUODENOSCOPY (EGD) WITH PROPOFOL;  Surgeon: Gatha Mayer, MD;  Location: [facility];  Service: Endoscopy;  Laterality: N/A;  . INNER EAR SURGERY     TUBES  . LAPAROSCOPIC CHOLECYSTECTOMY  2012  . TONSILLECTOMY AND ADENOIDECTOMY    . TUBAL LIGATION      Social History  Substance Use Topics  . Smoking status: Never Smoker  . Smokeless tobacco: Never Used  . 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    Family History  Problem Relation Age of Onset  . Breast cancer Mother        bilateral; ages 34 and 38; TAH/BSO ~50  . Depression Sister   . Heart disease Father   . Hypertension Father   . Heart attack Father   . Colon cancer  Neg Hx   . Esophageal cancer Neg Hx   . Pancreatic cancer Neg Hx   . Rectal cancer Neg Hx   . Stomach cancer Neg Hx     Allergies  Allergen Reactions  . Depakote [Divalproex Sodium] Anaphylaxis  . Diazepam Other (See Comments)    Died on operating table  . Valium Anaphylaxis  . Tizanidine Hcl Other (See Comments)    "passed out"    Medication list has been reviewed and updated.  Current Outpatient Prescriptions on File Prior to Visit  Medication Sig Dispense Refill  . aspirin 81 MG chewable tablet Chew 81 mg by mouth daily.    [name] atorvastatin (LIPITOR) 40 MG tablet Take 1 tablet (40 mg total) by mouth daily at 6 PM. 90 tablet 1  . blood glucose meter kit and supplies KIT Dispense based on patient and insurance preference. Use up to SIX times daily as directed. (FOR ICD-10 e16.21). 1  each 0  . chlordiazePOXIDE (LIBRIUM) 25 MG capsule 34m PO TID x 1D, then 25-511mPO BID X 1D, then 25-5046mO QD X 1D 10 capsule 0  . Cholecalciferol (VITAMIN D3) 2000 units TABS Take 2,000 Units by mouth daily.     . fexofenadine (ALLEGRA ALLERGY) 180 MG tablet Take 180 mg by mouth daily.    . fluconazole (DIFLUCAN) 100 MG tablet Take 1 tablet (100 mg total) by mouth daily. 20 tablet 0  . fluticasone (FLONASE) 50 MCG/ACT nasal spray Place 2 sprays into both nostrils daily.    . HMarland KitchenDROcodone-acetaminophen (NORCO/VICODIN) 5-325 MG tablet Take 1 tablet by mouth every 6 (six) hours as needed. 10 tablet 0  . metoprolol tartrate (LOPRESSOR) 25 MG tablet Take 1 tablet (25 mg total) by mouth 2 (two) times daily. 180 tablet 3  . nitroGLYCERIN (NITROSTAT) 0.4 MG SL tablet Place 1 tablet (0.4 mg total) under the tongue every 5 (five) minutes as needed for chest pain. 30 tablet 0  . Nutritional Supplements (HIGH-PROTEIN NUTRITIONAL SHAKE) LIQD Drink between meals three times a day. Ok to use brand and flavor of pt's choice. No ingredient restrictions. 90 Bottle 2  . Omega-3 Fatty Acids (FISH OIL) 1000 MG CAPS Take  1,000 mg by mouth 2 (two) times daily.     . oMarland Kitcheneprazole (PRILOSEC) 20 MG capsule Take 1 capsule (20 mg total) by mouth daily. 30 capsule 3  . ondansetron (ZOFRAN ODT) 4 MG disintegrating tablet Take 1 tablet (4 mg total) by mouth every 8 (eight) hours as needed for nausea. 6 tablet 0  . ondansetron (ZOFRAN ODT) 4 MG disintegrating tablet Take 1 tablet (4 mg total) by mouth every 8 (eight) hours as needed for nausea or vomiting. 20 tablet 0  . pantoprazole (PROTONIX) 40 MG tablet Take 1 tablet (40 mg total) by mouth 2 (two) times daily before a meal. 60 tablet 1  . prazosin (MINIPRESS) 2 MG capsule Take 2 mg by mouth at bedtime.     . PMarland KitchenESCRIPTION MEDICATION Inhale into the lungs at bedtime. CPAP    . Vilazodone HCl (VIIBRYD) 40 MG TABS Take 40 mg by mouth daily.    . vitamin B-12 (CYANOCOBALAMIN) 500 MCG tablet Take 500 mcg by mouth daily.     No current facility-administered medications on file prior to visit.     ROS ROS otherwise unremarkable unless listed above.  Physical Examination: BP 94/62   Pulse (!) 104   Temp 97.9 F (36.6 C) (Oral)   Resp 18   Ht '5\' 3"'  (1.6 m)   Wt 129 lb (58.5 kg)   SpO2 98%   BMI 22.85 kg/m  Ideal Body Weight: Weight in (lb) to have BMI = 25: 140.8  Physical Exam  Constitutional: She is oriented to person, place, and time. She appears well-developed and well-nourished. No distress.  HENT:  Head: Normocephalic and atraumatic.  Eyes: Conjunctivae and EOM are normal.  Cardiovascular: Normal rate, regular rhythm and intact distal pulses.   No murmur heard. Pulmonary/Chest: Effort normal and breath sounds normal.  Neurological: She is alert and oriented to person, place, and time.  Skin: She is not diaphoretic.  Multiple abrasions and peeling and peeling..  Marland Kitchensychiatric: She has a normal mood and affect. Her behavior is normal.     Assessment and Plan: EllANNALYSIA WILLENBRING a 56 46o. female who is here today for fever.   This may have been a faulty  reading, or possible healing manifestations.   Follow  up  At risk for fever associated with inflammation  Ivar Drape, PA-C [facility] 5/27/20188:28 AM

## 2016-10-10 ENCOUNTER — Encounter (HOSPITAL_COMMUNITY): Payer: Self-pay | Admitting: *Deleted

## 2016-10-10 ENCOUNTER — Emergency Department (HOSPITAL_COMMUNITY)
Admission: EM | Admit: 2016-10-10 | Discharge: 2016-10-11 | Disposition: A | Discharge status: Home / Self Care | Payer: Medicare HMO | Attending: Emergency Medicine | Admitting: Emergency Medicine

## 2016-10-10 DIAGNOSIS — Z7982 Long term (current) use of aspirin: Secondary | ICD-10-CM | POA: Insufficient documentation

## 2016-10-10 DIAGNOSIS — I10 Essential (primary) hypertension: Secondary | ICD-10-CM | POA: Diagnosis not present

## 2016-10-10 DIAGNOSIS — R69 Illness, unspecified: Secondary | ICD-10-CM | POA: Diagnosis not present

## 2016-10-10 DIAGNOSIS — I2510 Atherosclerotic heart disease of native coronary artery without angina pectoris: Secondary | ICD-10-CM | POA: Diagnosis not present

## 2016-10-10 DIAGNOSIS — F1010 Alcohol abuse, uncomplicated: Secondary | ICD-10-CM

## 2016-10-10 DIAGNOSIS — R251 Tremor, unspecified: Secondary | ICD-10-CM | POA: Diagnosis not present

## 2016-10-10 DIAGNOSIS — N179 Acute kidney failure, unspecified: Secondary | ICD-10-CM | POA: Diagnosis not present

## 2016-10-10 DIAGNOSIS — K0889 Other specified disorders of teeth and supporting structures: Secondary | ICD-10-CM | POA: Diagnosis not present

## 2016-10-10 DIAGNOSIS — Z79899 Other long term (current) drug therapy: Secondary | ICD-10-CM | POA: Insufficient documentation

## 2016-10-10 DIAGNOSIS — R0902 Hypoxemia: Secondary | ICD-10-CM | POA: Diagnosis not present

## 2016-10-10 DIAGNOSIS — I119 Hypertensive heart disease without heart failure: Secondary | ICD-10-CM | POA: Insufficient documentation

## 2016-10-10 DIAGNOSIS — K1379 Other lesions of oral mucosa: Secondary | ICD-10-CM | POA: Diagnosis not present

## 2016-10-10 LAB — CBC WITH DIFFERENTIAL/PLATELET
BASOS ABS: 0 10*3/uL (ref 0.0–0.1)
BASOS PCT: 1 %
EOS ABS: 0 10*3/uL (ref 0.0–0.7)
EOS PCT: 0 %
HCT: 35.2 % — ABNORMAL LOW (ref 36.0–46.0)
Hemoglobin: 12 g/dL (ref 12.0–15.0)
Lymphocytes Relative: 14 %
Lymphs Abs: 0.7 10*3/uL (ref 0.7–4.0)
MCH: 33.5 pg (ref 26.0–34.0)
MCHC: 34.1 g/dL (ref 30.0–36.0)
MCV: 98.3 fL (ref 78.0–100.0)
MONO ABS: 0.4 10*3/uL (ref 0.1–1.0)
Monocytes Relative: 9 %
Neutro Abs: 4 10*3/uL (ref 1.7–7.7)
Neutrophils Relative %: 76 %
PLATELETS: 239 10*3/uL (ref 150–400)
RBC: 3.58 MIL/uL — ABNORMAL LOW (ref 3.87–5.11)
RDW: 15.5 % (ref 11.5–15.5)
WBC: 5.2 10*3/uL (ref 4.0–10.5)

## 2016-10-10 LAB — COMPREHENSIVE METABOLIC PANEL
ALBUMIN: 4.1 g/dL (ref 3.5–5.0)
ALK PHOS: 97 U/L (ref 38–126)
ALT: 27 U/L (ref 14–54)
AST: 64 U/L — AB (ref 15–41)
Anion gap: 14 (ref 5–15)
BILIRUBIN TOTAL: 0.4 mg/dL (ref 0.3–1.2)
BUN: 11 mg/dL (ref 6–20)
CALCIUM: 8.2 mg/dL — AB (ref 8.9–10.3)
CO2: 24 mmol/L (ref 22–32)
CREATININE: 0.77 mg/dL (ref 0.44–1.00)
Chloride: 99 mmol/L — ABNORMAL LOW (ref 101–111)
GFR calc Af Amer: >60 mL/min (ref >60)
GFR calc non Af Amer: >60 mL/min (ref >60)
GLUCOSE: 110 mg/dL — AB (ref 65–99)
Potassium: 3.6 mmol/L (ref 3.5–5.1)
Sodium: 137 mmol/L (ref 135–145)
TOTAL PROTEIN: 7.3 g/dL (ref 6.5–8.1)

## 2016-10-10 LAB — RAPID URINE DRUG SCREEN, HOSP PERFORMED
Amphetamines: NOT DETECTED
BENZODIAZEPINES: POSITIVE — AB
Barbiturates: NOT DETECTED
COCAINE: NOT DETECTED
Opiates: POSITIVE — AB
Tetrahydrocannabinol: NOT DETECTED

## 2016-10-10 MED ORDER — LORAZEPAM 2 MG/ML IJ SOLN
1.0000 mg | Freq: Once | INTRAMUSCULAR | Status: DC
  Filled 2016-10-10: qty 1

## 2016-10-10 MED ORDER — FENTANYL CITRATE (PF) 100 MCG/2ML IJ SOLN
50.0000 ug | Freq: Once | INTRAMUSCULAR | Status: AC
  Administered 2016-10-10: 50 ug via INTRAVENOUS
  Filled 2016-10-10: qty 2

## 2016-10-10 MED ORDER — SODIUM CHLORIDE 0.9 % IV BOLUS (SEPSIS)
500.0000 mL | Freq: Once | INTRAVENOUS | Status: AC
  Administered 2016-10-10: 500 mL via INTRAVENOUS

## 2016-10-10 MED ORDER — CHLORDIAZEPOXIDE HCL 25 MG PO CAPS
50.0000 mg | ORAL_CAPSULE | Freq: Once | ORAL | Status: AC
  Administered 2016-10-10: 50 mg via ORAL
  Filled 2016-10-10: qty 2

## 2016-10-10 NOTE — ED Provider Notes (Signed)
[facility] DEPT Provider Note   CSN: [id number] Arrival date & time: 10/10/16  2217     History   Chief Complaint Chief Complaint  Patient presents with  . Shaking    HPI [name] is a 56 y.o. female.  HPI Patient presents with dental pain and shaking. On Thursday had dental surgery and had multiple teeth removed. States she was given hydrocodone liquid and states it made everything hurt more. States that because of that she drank alcohol. She drank what sounds like a pint of vodka yesterday. Patient states she is not supposed to drink because she is alcoholic. States that she last drank months or year ago. Reviewing records she was in the ER 2 weeks ago for alcohol abuse. Denies other substance abuse. States she is very shaky and doesn't know of is from the alcohol or from the pain. Now she is shaking in both of her upper extremities.   Past Medical History:  Diagnosis Date  . Allergy    SEASONAL  . Anemia   . Anxiety   . Arthritis   . ASD (atrial septal defect)    a. s/p ASD repair in 1973  . CAD (coronary artery disease)    a. 12/2015: NSTEMI occurring after cardiac arrest secondary to aspiration PNA. Echo w/ EF of 55-60%, no WMA. Outpt ischemic eval needed.  . Cardiomyopathy   . Deaf   . Depression   . Dysphagia    due to esophageal infections.  . Fibromyalgia   . Gastritis   . GERD (gastroesophageal reflux disease)   . Hearing impairment   . Hypertension    [name], take htn medication to regulate heart beat.  . Migraine   . Osteoporosis   . Pulmonary embolism ([place])    occured post c-section of her daughter  . Sleep apnea   . Vitamin D deficiency     Patient Active Problem List   Diagnosis Date Noted  . Pancytopenia ([place])   . Candida infection, esophageal ([place])   . Esophageal ring, acquired   . Neurocardiogenic syncope 07/19/2016  . Leukocytopenia 07/19/2016  . Macrocytosis 07/19/2016  . Abdominal pain, epigastric   . Bradycardia   .  Blepharitis of both eyes 02/03/2016  . Cardiac arrest ([place]) 12/22/2015  . Uncontrolled hypertension   . Aspiration pneumonia of right lower lobe due to gastric secretions ([place])   . Hypokalemia   . Pneumonia   . NSTEMI (non-ST elevated myocardial infarction) ([place]) 12/19/2015  . Typical atrial flutter ([place])   . Pressure ulcer 12/18/2015  . Protein-calorie malnutrition, severe 12/18/2015  . Metabolic acidosis 79/89/2119  . Lactic acidosis 12/17/2015  . Leucocytosis 12/17/2015  . Dehydration 12/17/2015  . Starvation ketoacidosis 12/17/2015  . Alcohol abuse 12/17/2015  . Increased anion gap metabolic acidosis 41/74/0814  . Essential hypertension 12/06/2015  . Accidental drug overdose 10/26/2015  . Aspiration pneumonia ([place]) 10/23/2015  . Acute respiratory failure with hypoxia ([place]) 10/23/2015  . Acute kidney injury ([place]) 10/23/2015  . Transaminitis 10/23/2015  . Positive blood culture 10/23/2015  . Elevated troponin 10/23/2015  . Thrombocytopenia ([place]) 10/23/2015  . Normocytic anemia 10/23/2015  . Shock ([place]) 10/19/2015  . Major depressive disorder, recurrent episode, moderate ([place]) 09/18/2014  . Non-traumatic compression fracture of vertebral column with routine healing 07/17/2014  . Osteoporosis 07/17/2014  . H/O vitamin D deficiency 07/17/2014  . Odynophagia 07/09/2014  . Esophageal dysphagia 07/08/2014  . Chest pain 05/20/2014  . Nausea & vomiting 05/20/2014  . Cardiovascular degeneration (with  mention of arteriosclerosis) 03/29/2014  . Major depression, chronic ([place]) 03/29/2014  . Breath shortness 03/29/2014  . Hypercholesterolemia without hypertriglyceridemia 03/29/2014  . Fatigue 03/29/2014  . Classical migraine with intractable migraine 03/29/2014  . Disorder of mitral valve 03/29/2014  . Atypical migraine 03/29/2014  . Atrial septal defect of fossa ovalis 03/29/2014  . HLD (hyperlipidemia) 03/29/2014  . Arthralgia of multiple joints 03/29/2014  . Anemia, iron  deficiency 03/29/2014  . Fibrositis 03/29/2014  . Abdominal pain 12/20/2013  . Absolute anemia 09/20/2013  . Bronchitis 09/20/2013  . Vaginitis and vulvovaginitis 09/20/2013  . Family history of breast cancer 07/13/2013  . Palpitation 07/02/2013  . History of mitral valve repair 07/02/2013  . Status post patch closure of ASD 07/02/2013  . Hyperlipidemia 07/02/2013  . OAB (overactive bladder) 06/13/2013  . Ovarian cyst, left 03/15/2013  . Vaginal lesion 03/15/2013  . Ovarian mass 03/15/2013  . Maternal DVT (deep vein thrombosis), history of 02/26/2013  . PMB (postmenopausal bleeding) 02/26/2013  . Alcohol dependence ([place]) 11/07/2012  . Unspecified vitamin D deficiency 08/17/2012  . L1 vertebral fracture ([place]) 08/01/2012  . Spinal compression fracture ([place]) 07/12/2012  . Menopausal state 07/12/2012  . Psoriasis 07/12/2012  . Severe episode of recurrent major depressive disorder ([place]) 12/07/2011  . Generalized anxiety disorder 12/07/2011  . Cardiomyopathy 03/16/2011  . Hearing impairment   . Migraine   . Arthritis     Past Surgical History:  Procedure Laterality Date  . APPENDECTOMY    . CARDIAC SURGERY  January 2007   mitral valve repair  . CARDIAC SURGERY  1973   atrial septal defect  . CARDIAC SURGERY    . COCHLEAR IMPLANT    . ESOPHAGOGASTRODUODENOSCOPY (EGD) WITH PROPOFOL N/A 07/20/2016   Procedure: ESOPHAGOGASTRODUODENOSCOPY (EGD) WITH PROPOFOL;  Surgeon: Gatha Mayer, MD;  Location: [facility];  Service: Endoscopy;  Laterality: N/A;  . INNER EAR SURGERY     TUBES  . LAPAROSCOPIC CHOLECYSTECTOMY  2012  . TONSILLECTOMY AND ADENOIDECTOMY    . TUBAL LIGATION      OB History    Gravida Para Term Preterm AB Living   '2 2 2 ' 0 0     SAB TAB Ectopic Multiple Live Births   0 0 0           Home Medications    Prior to Admission medications   Medication Sig Start Date End Date Taking? Authorizing Provider  aspirin 81 MG chewable tablet Chew 81 mg by mouth  daily.    [provider]  atorvastatin (LIPITOR) 40 MG tablet Take 1 tablet (40 mg total) by mouth daily at 6 PM. 02/02/16   [name], MD  blood glucose meter kit and supplies KIT Dispense based on patient and insurance preference. Use up to SIX times daily as directed. (FOR ICD-10 e16.21). 08/13/16   [name], MD  chlordiazePOXIDE (LIBRIUM) 25 MG capsule 39m PO TID x 1D, then 25-527mPO BID X 1D, then 25-5047mO QD X 1D 10/11/16   PicDavonna BellingD  Cholecalciferol (VITAMIN D3) 2000 units TABS Take 2,000 Units by mouth daily.     [provider]  fexofenadine (ALLEGRA ALLERGY) 180 MG tablet Take 180 mg by mouth daily.    [provider]  fluconazole (DIFLUCAN) 100 MG tablet Take 1 tablet (100 mg total) by mouth daily. 07/22/16   AmiLovenia KimD  fluticasone (FLONASE) 50 MCG/ACT nasal spray Place 2 sprays into both nostrils daily.    [provider]  HYDROcodone-acetaminophen (NORCO/VICODIN) 5-325 MG tablet Take 1 tablet by mouth every 6 (six) hours as needed. 09/20/16   Junius Creamer, NP  metoprolol tartrate (LOPRESSOR) 25 MG tablet Take 1 tablet (25 mg total) by mouth 2 (two) times daily. 07/22/16   [name], MD  nitroGLYCERIN (NITROSTAT) 0.4 MG SL tablet Place 1 tablet (0.4 mg total) under the tongue every 5 (five) minutes as needed for chest pain. 05/11/16   [name], MD  Nutritional Supplements (HIGH-PROTEIN NUTRITIONAL SHAKE) LIQD Drink between meals three times a day. Ok to use brand and flavor of pt's choice. No ingredient restrictions. 07/22/16   [name], MD  Omega-3 Fatty Acids (FISH OIL) 1000 MG CAPS Take 1,000 mg by mouth 2 (two) times daily.     [provider]  omeprazole (PRILOSEC) 20 MG capsule Take 1 capsule (20 mg total) by mouth daily. 09/28/16   Ivar Drape D, PA  ondansetron (ZOFRAN ODT) 4 MG disintegrating tablet Take 1 tablet (4 mg total) by mouth every 8 (eight) hours as needed for nausea. 11/08/15   [name], MD    ondansetron (ZOFRAN ODT) 4 MG disintegrating tablet Take 1 tablet (4 mg total) by mouth every 8 (eight) hours as needed for nausea or vomiting. 09/20/16   Junius Creamer, NP  pantoprazole (PROTONIX) 40 MG tablet Take 1 tablet (40 mg total) by mouth 2 (two) times daily before a meal. 07/22/16   [name], MD  prazosin (MINIPRESS) 2 MG capsule Take 2 mg by mouth at bedtime.  02/17/16   [provider]  PRESCRIPTION MEDICATION Inhale into the lungs at bedtime. CPAP    [provider]  Vilazodone HCl (VIIBRYD) 40 MG TABS Take 40 mg by mouth daily.    [provider]  vitamin B-12 (CYANOCOBALAMIN) 500 MCG tablet Take 500 mcg by mouth daily.    [provider]    Family History Family History  Problem Relation Age of Onset  . Breast cancer Mother        bilateral; ages 2 and 80; TAH/BSO ~50  . Depression Sister   . Heart disease Father   . Hypertension Father   . Heart attack Father   . Colon cancer Neg Hx   . Esophageal cancer Neg Hx   . Pancreatic cancer Neg Hx   . Rectal cancer Neg Hx   . Stomach cancer Neg Hx     Social History Social History  Substance Use Topics  . Smoking status: Never Smoker  . Smokeless tobacco: Never Used  . 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     Allergies   Depakote [divalproex sodium]; Diazepam; Valium; and Tizanidine hcl   Review of Systems Review of Systems  Constitutional: Positive for appetite change.  HENT: Negative for congestion.        Dental pain.  Cardiovascular: Negative for chest pain.  Gastrointestinal: Negative for abdominal pain.  Genitourinary: Negative for dysuria.  Musculoskeletal: Negative for back pain.  Neurological: Positive for tremors.     Physical Exam Updated Vital Signs BP (!) 143/109 (BP Location: Right Arm)   Pulse (!) 112   Temp 97.8 F (36.6 C) (Oral)   Resp (!) 24   SpO2 94%   Physical Exam  Constitutional: She  appears well-developed.  HENT:  Head: Atraumatic.  Post extractions of multiple teeth. Minimal swelling in the mouth. Appears to be well-healing.  Eyes: EOM are normal.  Pupils constricted bilaterally.  Neck: Neck supple.  Cardiovascular:  Mild tachycardia  Pulmonary/Chest: Effort normal.  Abdominal: Soft. There is no tenderness.  Musculoskeletal: She exhibits no edema.  Neurological: She is alert.  Tremor of bilateral upper extremities.  Skin: Skin is warm. Capillary refill takes less than 2 seconds.     ED Treatments / Results  Labs (all labs ordered are listed, but only abnormal results are displayed) Labs Reviewed  COMPREHENSIVE METABOLIC PANEL - Abnormal; Notable for the following:       Result Value   Chloride 99 (*)    Glucose, Bld 110 (*)    Calcium 8.2 (*)    AST 64 (*)    All other components within normal limits  CBC WITH DIFFERENTIAL/PLATELET - Abnormal; Notable for the following:    RBC 3.58 (*)    HCT 35.2 (*)    All other components within normal limits  RAPID URINE DRUG SCREEN, HOSP PERFORMED - Abnormal; Notable for the following:    Opiates POSITIVE (*)    Benzodiazepines POSITIVE (*)    All other components within normal limits  ETHANOL    EKG  EKG Interpretation None       Radiology No results found.  Procedures Procedures (including critical care time)  Medications Ordered in ED Medications  sodium chloride 0.9 % bolus 500 mL (500 mLs Intravenous New Bag/Given 10/10/16 2311)  chlordiazePOXIDE (LIBRIUM) capsule 50 mg (50 mg Oral Given 10/10/16 2314)  fentaNYL (SUBLIMAZE) injection 50 mcg (50 mcg Intravenous Given 10/10/16 2322)     Initial Impression / Assessment and Plan / ED Course  I have reviewed the triage vital signs and the nursing notes.  Pertinent labs & imaging results that were available during my care of the patient were reviewed by me and considered in my medical decision making (see chart for details).     Patient with  tremor. Alcohol abuse. Dental pain. Feels better after treatment. Likely D/c home after Alcohol level returns. Heart rate has decreased. Reviewed drug database. Will give the patient Librium but will not refill hydrocodone. Can call the dentist tomorrow.  Final Clinical Impressions(s) / ED Diagnoses   Final diagnoses:  Pain, dental  Alcohol abuse    New Prescriptions New Prescriptions   CHLORDIAZEPOXIDE (LIBRIUM) 25 MG CAPSULE    29m PO TID x 1D, then 25-512mPO BID X 1D, then 25-5028mO QD X 1D     PicDavonna BellingD 10/11/16 0013

## 2016-10-10 NOTE — ED Triage Notes (Signed)
Pt arrives via EMS. Pt had 12 teeth pulled on Tuesday, has been taking hydrocodone for the pain. Pt c/o shaking all over, nausea, and dental pain.

## 2016-10-10 NOTE — ED Triage Notes (Signed)
PT says that she has had severe pain from her dental surgery and the pain medicine is not helping, yesterday she drank a lot of alcohol yesterday. She c/o some abdominal discomfort, nausea and the dental pain.

## 2016-10-11 LAB — ETHANOL: Alcohol, Ethyl (B): <5 mg/dL (ref <5)

## 2016-10-11 MED ORDER — CHLORDIAZEPOXIDE HCL 25 MG PO CAPS: ORAL_CAPSULE | ORAL | 0 refills | Status: DC

## 2016-10-11 NOTE — Discharge Instructions (Signed)
Follow-up with the dentist for the dental pain. Follow-up for further management of your alcohol use.

## 2016-10-11 NOTE — ED Notes (Signed)
Pt now has much less shaking

## 2016-10-12 DIAGNOSIS — S42025D Nondisplaced fracture of shaft of left clavicle, subsequent encounter for fracture with routine healing: Secondary | ICD-10-CM | POA: Diagnosis not present

## 2016-10-14 DIAGNOSIS — R69 Illness, unspecified: Secondary | ICD-10-CM | POA: Diagnosis not present

## 2016-10-21 DIAGNOSIS — R69 Illness, unspecified: Secondary | ICD-10-CM | POA: Diagnosis not present

## 2016-10-28 DIAGNOSIS — R69 Illness, unspecified: Secondary | ICD-10-CM | POA: Diagnosis not present

## 2016-10-31 ENCOUNTER — Emergency Department (HOSPITAL_COMMUNITY): Payer: Medicare HMO

## 2016-10-31 ENCOUNTER — Emergency Department (HOSPITAL_COMMUNITY)
Admission: EM | Admit: 2016-10-31 | Discharge: 2016-10-31 | Disposition: A | Discharge status: Home / Self Care | Payer: Medicare HMO | Attending: Emergency Medicine | Admitting: Emergency Medicine

## 2016-10-31 ENCOUNTER — Encounter (HOSPITAL_COMMUNITY): Payer: Self-pay | Admitting: Emergency Medicine

## 2016-10-31 DIAGNOSIS — S4992XA Unspecified injury of left shoulder and upper arm, initial encounter: Secondary | ICD-10-CM | POA: Diagnosis present

## 2016-10-31 DIAGNOSIS — Y999 Unspecified external cause status: Secondary | ICD-10-CM | POA: Diagnosis not present

## 2016-10-31 DIAGNOSIS — F1010 Alcohol abuse, uncomplicated: Secondary | ICD-10-CM | POA: Insufficient documentation

## 2016-10-31 DIAGNOSIS — Y939 Activity, unspecified: Secondary | ICD-10-CM | POA: Insufficient documentation

## 2016-10-31 DIAGNOSIS — M25519 Pain in unspecified shoulder: Secondary | ICD-10-CM | POA: Diagnosis not present

## 2016-10-31 DIAGNOSIS — Z7982 Long term (current) use of aspirin: Secondary | ICD-10-CM | POA: Diagnosis not present

## 2016-10-31 DIAGNOSIS — I2510 Atherosclerotic heart disease of native coronary artery without angina pectoris: Secondary | ICD-10-CM | POA: Insufficient documentation

## 2016-10-31 DIAGNOSIS — I252 Old myocardial infarction: Secondary | ICD-10-CM | POA: Insufficient documentation

## 2016-10-31 DIAGNOSIS — R69 Illness, unspecified: Secondary | ICD-10-CM | POA: Diagnosis not present

## 2016-10-31 DIAGNOSIS — Y929 Unspecified place or not applicable: Secondary | ICD-10-CM | POA: Insufficient documentation

## 2016-10-31 DIAGNOSIS — S42012A Anterior displaced fracture of sternal end of left clavicle, initial encounter for closed fracture: Secondary | ICD-10-CM | POA: Diagnosis not present

## 2016-10-31 DIAGNOSIS — W19XXXA Unspecified fall, initial encounter: Secondary | ICD-10-CM | POA: Diagnosis not present

## 2016-10-31 DIAGNOSIS — S42035A Nondisplaced fracture of lateral end of left clavicle, initial encounter for closed fracture: Secondary | ICD-10-CM | POA: Diagnosis not present

## 2016-10-31 DIAGNOSIS — I10 Essential (primary) hypertension: Secondary | ICD-10-CM | POA: Insufficient documentation

## 2016-10-31 DIAGNOSIS — S42032A Displaced fracture of lateral end of left clavicle, initial encounter for closed fracture: Secondary | ICD-10-CM | POA: Diagnosis not present

## 2016-10-31 MED ORDER — PANTOPRAZOLE SODIUM 20 MG PO TBEC: 20.0000 mg | DELAYED_RELEASE_TABLET | Freq: Every day | ORAL | 0 refills | Status: DC

## 2016-10-31 MED ORDER — HYDROCODONE-ACETAMINOPHEN 5-325 MG PO TABS: 1.0000 | ORAL_TABLET | Freq: Four times a day (QID) | ORAL | 0 refills | Status: DC | PRN

## 2016-10-31 MED ORDER — FAMOTIDINE 20 MG PO TABS
20.0000 mg | ORAL_TABLET | Freq: Once | ORAL | Status: AC
  Administered 2016-10-31: 20 mg via ORAL
  Filled 2016-10-31: qty 1

## 2016-10-31 MED ORDER — ACETAMINOPHEN 500 MG PO TABS: 1000.0000 mg | ORAL_TABLET | Freq: Once | ORAL | Status: DC

## 2016-10-31 MED ORDER — ONDANSETRON HCL 4 MG/2ML IJ SOLN: 4.0000 mg | Freq: Once | INTRAMUSCULAR | Status: DC | PRN

## 2016-10-31 NOTE — ED Triage Notes (Signed)
Per EMS. Pt from home. Pt complains of pain from her L shoulder where she has a fracture from 2 weeks ago. Pt also complains of pain and decreased appetite since dental surgery a week ago. Began to have dry heaving and light headedness upon arrival to triage.

## 2016-10-31 NOTE — ED Notes (Signed)
Patient refused to take tylenol for pain and she state that last time she took pill here she choke and die for seven minutes. Pt state she is in so much pain she wants to kill her self.

## 2016-10-31 NOTE — ED Provider Notes (Signed)
[facility] DEPT Provider Note   CSN: [id number] Arrival date & time: 10/31/16  1227     History   Chief Complaint Chief Complaint  Patient presents with  . Shoulder Pain  . Emesis  . Dizziness    HPI [name] is a 56 y.o. female.  HPI Patient reports multiple sources of pain. She reports she has pain in her gums where her teeth were extracted and it hurts to eat or chew. She also reports she has pain in her left shoulder. She reports she fell on it 2 weeks ago that diagnosed with a fracture. She reports she was seen at [facility] for follow-up but fell again more recently on it and now it hurts more. Patient reports are to try to treat her pain she drinks alcohol. She estimates about a bottle of vodka in a day. She reports she also has problems with abdominal pain. She reports she's trying to take ensure that she got her teeth done but then she feels like it makes her kind of nauseated.  Past Medical History:  Diagnosis Date  . Allergy    SEASONAL  . Anemia   . Anxiety   . Arthritis   . ASD (atrial septal defect)    a. s/p ASD repair in 1973  . CAD (coronary artery disease)    a. 12/2015: NSTEMI occurring after cardiac arrest secondary to aspiration PNA. Echo w/ EF of 55-60%, no WMA. Outpt ischemic eval needed.  . Cardiomyopathy   . Deaf   . Depression   . Dysphagia    due to esophageal infections.  . Fibromyalgia   . Gastritis   . GERD (gastroesophageal reflux disease)   . Hearing impairment   . Hypertension    [name], take htn medication to regulate heart beat.  . Migraine   . Osteoporosis   . Pulmonary embolism ([place])    occured post c-section of her daughter  . Sleep apnea   . Vitamin D deficiency     Patient Active Problem List   Diagnosis Date Noted  . Pancytopenia ([place])   . Candida infection, esophageal ([place])   . Esophageal ring, acquired   . Neurocardiogenic syncope 07/19/2016  . Leukocytopenia 07/19/2016  . Macrocytosis 07/19/2016   . Abdominal pain, epigastric   . Bradycardia   . Blepharitis of both eyes 02/03/2016  . Cardiac arrest ([place]) 12/22/2015  . Uncontrolled hypertension   . Aspiration pneumonia of right lower lobe due to gastric secretions ([place])   . Hypokalemia   . Pneumonia   . NSTEMI (non-ST elevated myocardial infarction) ([place]) 12/19/2015  . Typical atrial flutter ([place])   . Pressure ulcer 12/18/2015  . Protein-calorie malnutrition, severe 12/18/2015  . Metabolic acidosis 90/30/0923  . Lactic acidosis 12/17/2015  . Leucocytosis 12/17/2015  . Dehydration 12/17/2015  . Starvation ketoacidosis 12/17/2015  . Alcohol abuse 12/17/2015  . Increased anion gap metabolic acidosis 30/11/6224  . Essential hypertension 12/06/2015  . Accidental drug overdose 10/26/2015  . Aspiration pneumonia ([place]) 10/23/2015  . Acute respiratory failure with hypoxia ([place]) 10/23/2015  . Acute kidney injury ([place]) 10/23/2015  . Transaminitis 10/23/2015  . Positive blood culture 10/23/2015  . Elevated troponin 10/23/2015  . Thrombocytopenia ([place]) 10/23/2015  . Normocytic anemia 10/23/2015  . Shock ([place]) 10/19/2015  . Major depressive disorder, recurrent episode, moderate ([place]) 09/18/2014  . Non-traumatic compression fracture of vertebral column with routine healing 07/17/2014  . Osteoporosis 07/17/2014  . H/O vitamin D deficiency 07/17/2014  . Odynophagia 07/09/2014  .  Esophageal dysphagia 07/08/2014  . Chest pain 05/20/2014  . Nausea & vomiting 05/20/2014  . 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 Major depression, chronic ([place]) 03/29/2014  . Breath shortness 03/29/2014  . Hypercholesterolemia without hypertriglyceridemia 03/29/2014  . Fatigue 03/29/2014  . Classical migraine with intractable migraine 03/29/2014  . Disorder of mitral valve 03/29/2014  . Atypical migraine 03/29/2014  . Atrial septal defect of fossa ovalis 03/29/2014  . HLD (hyperlipidemia) 03/29/2014  . Arthralgia  of multiple joints 03/29/2014  . Anemia, iron deficiency 03/29/2014  . Fibrositis 03/29/2014  . Abdominal pain 12/20/2013  . Absolute anemia 09/20/2013  . Bronchitis 09/20/2013  . Vaginitis and vulvovaginitis 09/20/2013  . Family history of breast cancer 07/13/2013  . Palpitation 07/02/2013  . History of mitral valve repair 07/02/2013  . Status post patch closure of ASD 07/02/2013  . Hyperlipidemia 07/02/2013  . OAB (overactive bladder) 06/13/2013  . Ovarian cyst, left 03/15/2013  . Vaginal lesion 03/15/2013  . Ovarian mass 03/15/2013  . Maternal DVT (deep vein thrombosis), history of 02/26/2013  . PMB (postmenopausal bleeding) 02/26/2013  . Alcohol dependence ([place]) 11/07/2012  . Unspecified vitamin D deficiency 08/17/2012  . L1 vertebral fracture ([place]) 08/01/2012  . Spinal compression fracture ([place]) 07/12/2012  . Menopausal state 07/12/2012  . Psoriasis 07/12/2012  . Severe episode of recurrent major depressive disorder ([place]) 12/07/2011  . Generalized anxiety disorder 12/07/2011  . Cardiomyopathy 03/16/2011  . Hearing impairment   . Migraine   . Arthritis     Past Surgical History:  Procedure Laterality Date  . APPENDECTOMY    . CARDIAC SURGERY  January 2007   mitral valve repair  . CARDIAC SURGERY  1973   atrial septal defect  . CARDIAC SURGERY    . COCHLEAR IMPLANT    . ESOPHAGOGASTRODUODENOSCOPY (EGD) WITH PROPOFOL N/A 07/20/2016   Procedure: ESOPHAGOGASTRODUODENOSCOPY (EGD) WITH PROPOFOL;  Surgeon: Gatha Mayer, MD;  Location: [facility];  Service: Endoscopy;  Laterality: N/A;  . INNER EAR SURGERY     TUBES  . LAPAROSCOPIC CHOLECYSTECTOMY  2012  . TONSILLECTOMY AND ADENOIDECTOMY    . TUBAL LIGATION      OB History    Gravida Para Term Preterm AB Living   _0 0 0     SAB TAB Ectopic Multiple Live Births   0 0 0           Home Medications    Prior to Admission medications   Medication Sig Start Date End Date Taking? Authorizing Provider  aspirin  81 MG chewable tablet Chew 81 mg by mouth daily.   Yes [provider]  atorvastatin (LIPITOR) 40 MG tablet Take 1 tablet (40 mg total) by mouth daily at 6 PM. 02/02/16  Yes [name], MD  blood glucose meter kit and supplies KIT Dispense based on patient and insurance preference. Use up to SIX times daily as directed. (FOR ICD-10 e16.21). 08/13/16  Yes [name], MD  Cholecalciferol (VITAMIN D3) 2000 units TABS Take 2,000 Units by mouth daily.    Yes [provider]  fexofenadine (ALLEGRA ALLERGY) 180 MG tablet Take 180 mg by mouth daily.   Yes [provider]  fluticasone (FLONASE) 50 MCG/ACT nasal spray Place 2 sprays into both nostrils daily.   Yes [provider]  metoprolol tartrate (LOPRESSOR) 25 MG tablet Take 1 tablet (25 mg total) by mouth 2 (two) times daily. 07/22/16  Yes [name], MD  nitroGLYCERIN (NITROSTAT) 0.4  MG SL tablet Place 1 tablet (0.4 mg total) under the tongue every 5 (five) minutes as needed for chest pain. 05/11/16  Yes [name], MD  Nutritional Supplements (HIGH-PROTEIN NUTRITIONAL SHAKE) LIQD Drink between meals three times a day. Ok to use brand and flavor of pt's choice. No ingredient restrictions. 07/22/16  Yes [name], MD  Omega-3 Fatty Acids (FISH OIL) 1000 MG CAPS Take 1,000 mg by mouth 2 (two) times daily.    Yes [provider]  pantoprazole (PROTONIX) 40 MG tablet Take 1 tablet (40 mg total) by mouth 2 (two) times daily before a meal. 07/22/16  Yes [name], MD  prazosin (MINIPRESS) 2 MG capsule Take 2 mg by mouth at bedtime.  02/17/16  Yes [provider]  [place] into the lungs at bedtime. CPAP   Yes [provider]  Vilazodone HCl (VIIBRYD) 40 MG TABS Take 40 mg by mouth daily.   Yes [provider]  vitamin B-12 (CYANOCOBALAMIN) 500 MCG tablet Take 500 mcg by mouth daily.   Yes [provider]  chlordiazePOXIDE (LIBRIUM) 25 MG capsule 87m PO TID x  1D, then 25-565mPO BID X 1D, then 25-5021mO QD X 1D Patient not taking: Reported on 10/31/2016 10/11/16   PicDavonna BellingD  fluconazole (DIFLUCAN) 100 MG tablet Take 1 tablet (100 mg total) by mouth daily. Patient not taking: Reported on 10/31/2016 07/22/16   AmiLovenia KimD  HYDROcodone-acetaminophen (NORCO/VICODIN) 5-325 MG tablet Take 1 tablet by mouth every 6 (six) hours as needed. Patient not taking: Reported on 10/31/2016 09/20/16   SchJunius CreamerP  HYDROcodone-acetaminophen (NORCO/VICODIN) 5-325 MG tablet Take 1 tablet by mouth every 6 (six) hours as needed for moderate pain or severe pain. 10/31/16   PfeCharlesetta ShanksD  omeprazole (PRILOSEC) 20 MG capsule Take 1 capsule (20 mg total) by mouth daily. Patient not taking: Reported on 10/31/2016 09/28/16   EngIvar Drape PA  ondansetron (ZOFRAN ODT) 4 MG disintegrating tablet Take 1 tablet (4 mg total) by mouth every 8 (eight) hours as needed for nausea. Patient not taking: Reported on 10/31/2016 11/08/15   JamTanna FurryD  ondansetron (ZOFRAN ODT) 4 MG disintegrating tablet Take 1 tablet (4 mg total) by mouth every 8 (eight) hours as needed for nausea or vomiting. Patient not taking: Reported on 10/31/2016 09/20/16   SchJunius CreamerP  pantoprazole (PROTONIX) 20 MG tablet Take 1 tablet (20 mg total) by mouth daily. 10/31/16   PfeCharlesetta ShanksD    Family History Family History  Problem Relation Age of Onset  . Breast cancer Mother        bilateral; ages 59 67d 69;1AH/BSO ~50  . Depression Sister   . Heart disease Father   . Hypertension Father   . Heart attack Father   . Colon cancer Neg Hx   . Esophageal cancer Neg Hx   . Pancreatic cancer Neg Hx   . Rectal cancer Neg Hx   . Stomach cancer Neg Hx     Social History Social History  Substance Use Topics  . Smoking status: Never Smoker  . Smokeless tobacco: Never Used  . Alcohol use 0.0 oz/week     Comment: Alcoholic that has been to AA & has a couSocial worker    Allergies   Depakote [divalproex sodium]; Diazepam; Valium; and Tizanidine hcl   Review of Systems Review of Systems 10 Systems reviewed and are negative for acute change except as  noted in the HPI.   Physical Exam Updated Vital Signs BP (!) 170/83 (BP Location: Right Arm)   Pulse (!) 112   Temp 98.4 F (36.9 C) (Oral)   Resp 18   SpO2 100%   Physical Exam  Constitutional: She is oriented to person, place, and time.  Patient is alert and nontoxic. She is somewhat tearful intermittently. No respiratory distress. Mental status clear.  HENT:  Head: Normocephalic and atraumatic.  Mouth/Throat: Oropharynx is clear and moist.  Patient has had all dentition extracted. Gums are in good condition and nearly completely healed. There are no areas of swelling or drainage. No facial swelling. Normal range of motion of the jaw and oropharynx is widely patent.  Eyes: EOM are normal. Pupils are equal, round, and reactive to light.  Cardiovascular:  Mild tachycardia. No rub murmur gallop.  Pulmonary/Chest: Effort normal and breath sounds normal.  Abdominal:  Patient endorses some tenderness in the epigastrium but soft without guarding or rebound. Lower abdomen soft nontender.  Musculoskeletal:  Patient does not have obvious deformity or swelling over the left clavicle. She does however endorse pain with range of motion of the shoulder palpation over the clavicle. Distal pulses 2+ and symmetric. No edema.  Neurological: She is alert and oriented to person, place, and time. She exhibits normal muscle tone. Coordination normal.  Ratio is slightly anxious and mildly tremulous.  Skin: Skin is warm and dry.  Psychiatric:  Patient is intermittently tearful.     ED Treatments / Results  Labs (all labs ordered are listed, but only abnormal results are displayed) Labs Reviewed - No data to display  EKG  EKG Interpretation None       Radiology Dg Shoulder Left  Result Date:  10/31/2016 CLINICAL DATA:  Two falls over the past week.  Left shoulder pain. EXAM: LEFT SHOULDER - 2+ VIEW COMPARISON:  Chest x-ray 04/13/2016 FINDINGS: Exam demonstrates a nondisplaced distal left clavicle fracture. Glenohumeral joint is within normal. IMPRESSION: Minimally displaced distal left clavicle fracture. Electronically Signed   By: Marin Olp M.D.   On: 10/31/2016 14:06   Dg Humerus Left  Result Date: 10/31/2016 CLINICAL DATA:  Two falls over the past week. EXAM: LEFT HUMERUS - 2+ VIEW COMPARISON:  Chest x-ray 04/13/2016 FINDINGS: Examination demonstrates a displaced distal left clavicle fracture. Glenohumeral joint is within normal. Left humerus is within normal. IMPRESSION: Minimally displaced distal left clavicle fracture. Electronically Signed   By: Marin Olp M.D.   On: 10/31/2016 14:06    Procedures Procedures (including critical care time)  Medications Ordered in ED Medications  ondansetron (ZOFRAN) injection 4 mg (not administered)  acetaminophen (TYLENOL) tablet 1,000 mg (1,000 mg Oral Not Given 10/31/16 1408)  famotidine (PEPCID) tablet 20 mg (not administered)     Initial Impression / Assessment and Plan / ED Course  I have reviewed the triage vital signs and the nursing notes.  Pertinent labs & imaging results that were available during my care of the patient were reviewed by me and considered in my medical decision making (see chart for details).      Final Clinical Impressions(s) / ED Diagnoses   Final diagnoses:  Closed nondisplaced fracture of acromial end of left clavicle, initial encounter  Chronic alcohol abuse   Patient does have multiple pain complaints. I do not have comparison x-ray for her clavicle fracture. There does not appear to be callus formation or healing at this point. I assume some newer fracture based on patient's history and  pain report. Alignment is good however no significant swelling or hematoma formation. Patient is counseled on  the use of a sling for improved pain control. She is given a small amount of Vicodin for pain for presumed newer clavicle fracture. Patient does have a significant alcohol abuse use history. She is however appropriate with clear mental status and no signs of active withdrawal.  New Prescriptions New Prescriptions   HYDROCODONE-ACETAMINOPHEN (NORCO/VICODIN) 5-325 MG TABLET    Take 1 tablet by mouth every 6 (six) hours as needed for moderate pain or severe pain.   PANTOPRAZOLE (PROTONIX) 20 MG TABLET    Take 1 tablet (20 mg total) by mouth daily.     Charlesetta Shanks, MD 10/31/16 [phone]

## 2016-11-02 DIAGNOSIS — S42025D Nondisplaced fracture of shaft of left clavicle, subsequent encounter for fracture with routine healing: Secondary | ICD-10-CM | POA: Diagnosis not present

## 2016-11-02 NOTE — Telephone Encounter (Signed)
Follow up information for [facility]. Due to low Calcium levels and recurrent medical problems with ETOH abuse and recent pancreatitis with recent fall with fracture shoulder and phone call to pt with no return phone calls to [facility] regarding her osteoporosis I will forward this information to Dr Toney Rakes to close this encounter.

## 2016-11-03 DIAGNOSIS — R69 Illness, unspecified: Secondary | ICD-10-CM | POA: Diagnosis not present

## 2016-11-03 NOTE — Telephone Encounter (Signed)
All we have left to do is to center other and keep a copy in our records that we have attempted to contact her

## 2016-11-04 DIAGNOSIS — R69 Illness, unspecified: Secondary | ICD-10-CM | POA: Diagnosis not present

## 2016-11-11 DIAGNOSIS — R69 Illness, unspecified: Secondary | ICD-10-CM | POA: Diagnosis not present

## 2016-11-16 ENCOUNTER — Telehealth: Payer: Self-pay | Admitting: Gastroenterology

## 2016-11-16 ENCOUNTER — Encounter: Payer: Medicare HMO | Admitting: Gastroenterology

## 2016-11-16 NOTE — Telephone Encounter (Signed)
[place], thanks.  She should be charged for the no-show

## 2016-11-17 ENCOUNTER — Emergency Department (HOSPITAL_COMMUNITY): Payer: Medicare HMO

## 2016-11-17 ENCOUNTER — Inpatient Hospital Stay (HOSPITAL_COMMUNITY)
Admission: EM | Admit: 2016-11-17 | Discharge: 2016-11-20 | DRG: 439 | Disposition: A | Discharge status: Home / Self Care | Payer: Medicare HMO | Attending: Internal Medicine | Admitting: Internal Medicine

## 2016-11-17 ENCOUNTER — Encounter (HOSPITAL_COMMUNITY): Payer: Self-pay | Admitting: Emergency Medicine

## 2016-11-17 DIAGNOSIS — Z86711 Personal history of pulmonary embolism: Secondary | ICD-10-CM

## 2016-11-17 DIAGNOSIS — F1010 Alcohol abuse, uncomplicated: Secondary | ICD-10-CM | POA: Diagnosis present

## 2016-11-17 DIAGNOSIS — Z952 Presence of prosthetic heart valve: Secondary | ICD-10-CM

## 2016-11-17 DIAGNOSIS — I10 Essential (primary) hypertension: Secondary | ICD-10-CM | POA: Diagnosis not present

## 2016-11-17 DIAGNOSIS — Z888 Allergy status to other drugs, medicaments and biological substances status: Secondary | ICD-10-CM

## 2016-11-17 DIAGNOSIS — E8729 Other acidosis: Secondary | ICD-10-CM | POA: Diagnosis present

## 2016-11-17 DIAGNOSIS — Z7982 Long term (current) use of aspirin: Secondary | ICD-10-CM

## 2016-11-17 DIAGNOSIS — K7460 Unspecified cirrhosis of liver: Secondary | ICD-10-CM | POA: Diagnosis present

## 2016-11-17 DIAGNOSIS — F419 Anxiety disorder, unspecified: Secondary | ICD-10-CM | POA: Diagnosis present

## 2016-11-17 DIAGNOSIS — I2510 Atherosclerotic heart disease of native coronary artery without angina pectoris: Secondary | ICD-10-CM | POA: Diagnosis present

## 2016-11-17 DIAGNOSIS — K449 Diaphragmatic hernia without obstruction or gangrene: Secondary | ICD-10-CM | POA: Diagnosis not present

## 2016-11-17 DIAGNOSIS — Z9989 Dependence on other enabling machines and devices: Secondary | ICD-10-CM

## 2016-11-17 DIAGNOSIS — Q211 Atrial septal defect: Secondary | ICD-10-CM

## 2016-11-17 DIAGNOSIS — M1990 Unspecified osteoarthritis, unspecified site: Secondary | ICD-10-CM | POA: Diagnosis present

## 2016-11-17 DIAGNOSIS — E559 Vitamin D deficiency, unspecified: Secondary | ICD-10-CM | POA: Diagnosis present

## 2016-11-17 DIAGNOSIS — R112 Nausea with vomiting, unspecified: Secondary | ICD-10-CM | POA: Diagnosis present

## 2016-11-17 DIAGNOSIS — E872 Acidosis, unspecified: Secondary | ICD-10-CM | POA: Diagnosis present

## 2016-11-17 DIAGNOSIS — Z9621 Cochlear implant status: Secondary | ICD-10-CM | POA: Diagnosis present

## 2016-11-17 DIAGNOSIS — K852 Alcohol induced acute pancreatitis without necrosis or infection: Secondary | ICD-10-CM | POA: Diagnosis not present

## 2016-11-17 DIAGNOSIS — I429 Cardiomyopathy, unspecified: Secondary | ICD-10-CM | POA: Diagnosis present

## 2016-11-17 DIAGNOSIS — E785 Hyperlipidemia, unspecified: Secondary | ICD-10-CM | POA: Diagnosis present

## 2016-11-17 DIAGNOSIS — R0789 Other chest pain: Secondary | ICD-10-CM | POA: Diagnosis not present

## 2016-11-17 DIAGNOSIS — Z8674 Personal history of sudden cardiac arrest: Secondary | ICD-10-CM

## 2016-11-17 DIAGNOSIS — I252 Old myocardial infarction: Secondary | ICD-10-CM

## 2016-11-17 DIAGNOSIS — R7401 Elevation of levels of liver transaminase levels: Secondary | ICD-10-CM | POA: Diagnosis present

## 2016-11-17 DIAGNOSIS — K2970 Gastritis, unspecified, without bleeding: Secondary | ICD-10-CM | POA: Diagnosis not present

## 2016-11-17 DIAGNOSIS — K858 Other acute pancreatitis without necrosis or infection: Secondary | ICD-10-CM

## 2016-11-17 DIAGNOSIS — R1013 Epigastric pain: Secondary | ICD-10-CM | POA: Diagnosis not present

## 2016-11-17 DIAGNOSIS — R110 Nausea: Secondary | ICD-10-CM | POA: Diagnosis not present

## 2016-11-17 DIAGNOSIS — M810 Age-related osteoporosis without current pathological fracture: Secondary | ICD-10-CM | POA: Diagnosis present

## 2016-11-17 DIAGNOSIS — R740 Nonspecific elevation of levels of transaminase and lactic acid dehydrogenase [LDH]: Secondary | ICD-10-CM | POA: Diagnosis not present

## 2016-11-17 DIAGNOSIS — R1310 Dysphagia, unspecified: Secondary | ICD-10-CM

## 2016-11-17 DIAGNOSIS — F329 Major depressive disorder, single episode, unspecified: Secondary | ICD-10-CM | POA: Diagnosis present

## 2016-11-17 DIAGNOSIS — R4702 Dysphasia: Secondary | ICD-10-CM | POA: Diagnosis present

## 2016-11-17 DIAGNOSIS — F10239 Alcohol dependence with withdrawal, unspecified: Secondary | ICD-10-CM | POA: Diagnosis present

## 2016-11-17 DIAGNOSIS — M797 Fibromyalgia: Secondary | ICD-10-CM | POA: Diagnosis present

## 2016-11-17 DIAGNOSIS — Z79899 Other long term (current) drug therapy: Secondary | ICD-10-CM

## 2016-11-17 DIAGNOSIS — K210 Gastro-esophageal reflux disease with esophagitis: Secondary | ICD-10-CM | POA: Diagnosis present

## 2016-11-17 DIAGNOSIS — R109 Unspecified abdominal pain: Secondary | ICD-10-CM | POA: Diagnosis not present

## 2016-11-17 DIAGNOSIS — R1319 Other dysphagia: Secondary | ICD-10-CM

## 2016-11-17 DIAGNOSIS — H9190 Unspecified hearing loss, unspecified ear: Secondary | ICD-10-CM | POA: Diagnosis present

## 2016-11-17 DIAGNOSIS — G4730 Sleep apnea, unspecified: Secondary | ICD-10-CM | POA: Diagnosis present

## 2016-11-17 DIAGNOSIS — D696 Thrombocytopenia, unspecified: Secondary | ICD-10-CM | POA: Diagnosis present

## 2016-11-17 DIAGNOSIS — F10929 Alcohol use, unspecified with intoxication, unspecified: Secondary | ICD-10-CM | POA: Diagnosis present

## 2016-11-17 DIAGNOSIS — K860 Alcohol-induced chronic pancreatitis: Secondary | ICD-10-CM | POA: Diagnosis present

## 2016-11-17 DIAGNOSIS — R69 Illness, unspecified: Secondary | ICD-10-CM | POA: Diagnosis not present

## 2016-11-17 DIAGNOSIS — K859 Acute pancreatitis without necrosis or infection, unspecified: Secondary | ICD-10-CM | POA: Diagnosis not present

## 2016-11-17 HISTORY — DX: Unspecified chronic bronchitis: J42

## 2016-11-17 HISTORY — DX: Low back pain, unspecified: M54.50

## 2016-11-17 HISTORY — DX: Other chronic pain: G89.29

## 2016-11-17 HISTORY — DX: Pneumonia, unspecified organism: J18.9

## 2016-11-17 HISTORY — DX: Post-traumatic stress disorder, unspecified: F43.10

## 2016-11-17 HISTORY — DX: Personal history of urinary calculi: Z87.442

## 2016-11-17 HISTORY — DX: Pure hypercholesterolemia, unspecified: E78.00

## 2016-11-17 HISTORY — DX: Personal history of other diseases of the digestive system: Z87.19

## 2016-11-17 HISTORY — DX: Low back pain: M54.5

## 2016-11-17 HISTORY — DX: Meningitis, unspecified: G03.9

## 2016-11-17 LAB — URINALYSIS, ROUTINE W REFLEX MICROSCOPIC
BILIRUBIN URINE: NEGATIVE
Bacteria, UA: NONE SEEN
GLUCOSE, UA: NEGATIVE mg/dL
KETONES UR: 80 mg/dL — AB
LEUKOCYTES UA: NEGATIVE
Nitrite: NEGATIVE
PH: 7 (ref 5.0–8.0)
Protein, ur: NEGATIVE mg/dL
Specific Gravity, Urine: >1.046 — ABNORMAL HIGH (ref 1.005–1.030)

## 2016-11-17 LAB — I-STAT CG4 LACTIC ACID, ED
LACTIC ACID, VENOUS: 2.71 mmol/L — AB (ref 0.5–1.9)
Lactic Acid, Venous: 1.46 mmol/L (ref 0.5–1.9)

## 2016-11-17 LAB — I-STAT TROPONIN, ED: TROPONIN I, POC: 0 ng/mL (ref 0.00–0.08)

## 2016-11-17 LAB — CBC
HCT: 38.5 % (ref 36.0–46.0)
Hemoglobin: 13.1 g/dL (ref 12.0–15.0)
MCH: 33.2 pg (ref 26.0–34.0)
MCHC: 34 g/dL (ref 30.0–36.0)
MCV: 97.7 fL (ref 78.0–100.0)
PLATELETS: 160 10*3/uL (ref 150–400)
RBC: 3.94 MIL/uL (ref 3.87–5.11)
RDW: 15.6 % — AB (ref 11.5–15.5)
WBC: 4 10*3/uL (ref 4.0–10.5)

## 2016-11-17 LAB — BASIC METABOLIC PANEL
Anion gap: 18 — ABNORMAL HIGH (ref 5–15)
BUN: 11 mg/dL (ref 6–20)
CALCIUM: 8.9 mg/dL (ref 8.9–10.3)
CO2: 22 mmol/L (ref 22–32)
CREATININE: 0.87 mg/dL (ref 0.44–1.00)
Chloride: 96 mmol/L — ABNORMAL LOW (ref 101–111)
GFR calc non Af Amer: >60 mL/min (ref >60)
Glucose, Bld: 105 mg/dL — ABNORMAL HIGH (ref 65–99)
Potassium: 4.1 mmol/L (ref 3.5–5.1)
Sodium: 136 mmol/L (ref 135–145)

## 2016-11-17 LAB — ETHANOL: Alcohol, Ethyl (B): <5 mg/dL (ref <5)

## 2016-11-17 LAB — HEPATIC FUNCTION PANEL
ALT: 24 U/L (ref 14–54)
AST: 65 U/L — ABNORMAL HIGH (ref 15–41)
Albumin: 3.9 g/dL (ref 3.5–5.0)
Alkaline Phosphatase: 138 U/L — ABNORMAL HIGH (ref 38–126)
BILIRUBIN DIRECT: 0.3 mg/dL (ref 0.1–0.5)
BILIRUBIN INDIRECT: 1.5 mg/dL — AB (ref 0.3–0.9)
BILIRUBIN TOTAL: 1.8 mg/dL — AB (ref 0.3–1.2)
Total Protein: 6.8 g/dL (ref 6.5–8.1)

## 2016-11-17 LAB — LACTIC ACID, PLASMA: LACTIC ACID, VENOUS: 1.6 mmol/L (ref 0.5–1.9)

## 2016-11-17 LAB — MAGNESIUM: Magnesium: 1.4 mg/dL — ABNORMAL LOW (ref 1.7–2.4)

## 2016-11-17 LAB — LIPASE, BLOOD: LIPASE: 66 U/L — AB (ref 11–51)

## 2016-11-17 LAB — PHOSPHORUS: PHOSPHORUS: 2.5 mg/dL (ref 2.5–4.6)

## 2016-11-17 LAB — GLUCOSE, CAPILLARY: Glucose-Capillary: 134 mg/dL — ABNORMAL HIGH (ref 65–99)

## 2016-11-17 MED ORDER — LOPERAMIDE HCL 2 MG PO CAPS
2.0000 mg | ORAL_CAPSULE | ORAL | Status: AC | PRN
  Administered 2016-11-19: 4 mg via ORAL
  Filled 2016-11-17: qty 2

## 2016-11-17 MED ORDER — FENTANYL CITRATE (PF) 100 MCG/2ML IJ SOLN
50.0000 ug | Freq: Once | INTRAMUSCULAR | Status: AC
  Administered 2016-11-17: 50 ug via INTRAVENOUS
  Filled 2016-11-17: qty 2

## 2016-11-17 MED ORDER — METOPROLOL TARTRATE 25 MG PO TABS
25.0000 mg | ORAL_TABLET | Freq: Two times a day (BID) | ORAL | Status: DC
  Filled 2016-11-17: qty 1

## 2016-11-17 MED ORDER — SODIUM CHLORIDE 0.9 % IV SOLN
INTRAVENOUS | Status: DC
  Administered 2016-11-17 – 2016-11-19 (×6): via INTRAVENOUS

## 2016-11-17 MED ORDER — SUCRALFATE 1 GM/10ML PO SUSP
1.0000 g | Freq: Three times a day (TID) | ORAL | Status: DC
  Administered 2016-11-17 – 2016-11-20 (×12): 1 g via ORAL
  Filled 2016-11-17 (×12): qty 10

## 2016-11-17 MED ORDER — METOPROLOL TARTRATE 25 MG/10 ML ORAL SUSPENSION
25.0000 mg | Freq: Two times a day (BID) | ORAL | Status: DC
  Administered 2016-11-17 – 2016-11-20 (×7): 25 mg via ORAL
  Filled 2016-11-17 (×9): qty 10

## 2016-11-17 MED ORDER — FLUTICASONE PROPIONATE 50 MCG/ACT NA SUSP
2.0000 | Freq: Every day | NASAL | Status: DC
  Administered 2016-11-18 – 2016-11-20 (×3): 2 via NASAL
  Filled 2016-11-17 (×2): qty 16

## 2016-11-17 MED ORDER — VILAZODONE HCL 40 MG PO TABS
40.0000 mg | ORAL_TABLET | Freq: Every day | ORAL | Status: DC
  Administered 2016-11-18 – 2016-11-20 (×3): 40 mg via ORAL
  Filled 2016-11-17 (×3): qty 1

## 2016-11-17 MED ORDER — PRAZOSIN HCL 2 MG PO CAPS
2.0000 mg | ORAL_CAPSULE | Freq: Every day | ORAL | Status: DC
  Administered 2016-11-17 – 2016-11-19 (×3): 2 mg via ORAL
  Filled 2016-11-17 (×4): qty 1

## 2016-11-17 MED ORDER — FOLIC ACID 5 MG/ML IJ SOLN
1.0000 mg | Freq: Every day | INTRAMUSCULAR | Status: DC
  Administered 2016-11-17 – 2016-11-20 (×4): 1 mg via INTRAVENOUS
  Filled 2016-11-17 (×6): qty 0.2

## 2016-11-17 MED ORDER — ONDANSETRON HCL 4 MG/2ML IJ SOLN
4.0000 mg | Freq: Once | INTRAMUSCULAR | Status: AC
  Administered 2016-11-17: 4 mg via INTRAVENOUS
  Filled 2016-11-17: qty 2

## 2016-11-17 MED ORDER — KCL IN DEXTROSE-NACL 20-5-0.45 MEQ/L-%-% IV SOLN
Freq: Once | INTRAVENOUS | Status: AC
  Administered 2016-11-17: 12:00:00 via INTRAVENOUS
  Filled 2016-11-17: qty 1000

## 2016-11-17 MED ORDER — ONDANSETRON HCL 4 MG/2ML IJ SOLN: 4.0000 mg | Freq: Four times a day (QID) | INTRAMUSCULAR | Status: DC | PRN

## 2016-11-17 MED ORDER — LORAZEPAM 2 MG/ML IJ SOLN
2.0000 mg | INTRAMUSCULAR | Status: DC | PRN
  Administered 2016-11-17: 2 mg via INTRAVENOUS

## 2016-11-17 MED ORDER — ACETAMINOPHEN 325 MG PO TABS
650.0000 mg | ORAL_TABLET | Freq: Four times a day (QID) | ORAL | Status: DC | PRN
  Filled 2016-11-17: qty 2

## 2016-11-17 MED ORDER — HYDROXYZINE HCL 25 MG PO TABS
25.0000 mg | ORAL_TABLET | Freq: Four times a day (QID) | ORAL | Status: DC | PRN
  Filled 2016-11-17: qty 1

## 2016-11-17 MED ORDER — HYDROXYZINE HCL 10 MG/5ML PO SYRP
25.0000 mg | ORAL_SOLUTION | Freq: Four times a day (QID) | ORAL | Status: AC | PRN
  Administered 2016-11-17: 25 mg via ORAL
  Filled 2016-11-17: qty 12.5

## 2016-11-17 MED ORDER — ONDANSETRON HCL 4 MG PO TABS: 4.0000 mg | ORAL_TABLET | Freq: Four times a day (QID) | ORAL | Status: DC | PRN

## 2016-11-17 MED ORDER — PIPERACILLIN-TAZOBACTAM 3.375 G IVPB 30 MIN
3.3750 g | Freq: Once | INTRAVENOUS | Status: AC
  Administered 2016-11-17: 3.375 g via INTRAVENOUS
  Filled 2016-11-17: qty 50

## 2016-11-17 MED ORDER — IOPAMIDOL (ISOVUE-300) INJECTION 61%
INTRAVENOUS | Status: AC
  Administered 2016-11-17: 100 mL
  Filled 2016-11-17: qty 100

## 2016-11-17 MED ORDER — VANCOMYCIN HCL 500 MG IV SOLR: 500.0000 mg | Freq: Two times a day (BID) | INTRAVENOUS | Status: DC

## 2016-11-17 MED ORDER — ACETAMINOPHEN 650 MG RE SUPP: 650.0000 mg | Freq: Four times a day (QID) | RECTAL | Status: DC | PRN

## 2016-11-17 MED ORDER — ADULT MULTIVITAMIN W/MINERALS CH: 1.0000 | ORAL_TABLET | Freq: Every day | ORAL | Status: DC

## 2016-11-17 MED ORDER — THIAMINE HCL 100 MG/ML IJ SOLN
100.0000 mg | Freq: Every day | INTRAMUSCULAR | Status: DC
  Administered 2016-11-17 – 2016-11-20 (×4): 100 mg via INTRAVENOUS
  Filled 2016-11-17 (×4): qty 2

## 2016-11-17 MED ORDER — ATORVASTATIN CALCIUM 40 MG PO TABS
40.0000 mg | ORAL_TABLET | Freq: Every day | ORAL | Status: DC
  Administered 2016-11-17 – 2016-11-19 (×3): 40 mg via ORAL
  Filled 2016-11-17 (×3): qty 1

## 2016-11-17 MED ORDER — MORPHINE SULFATE (PF) 4 MG/ML IV SOLN
4.0000 mg | INTRAVENOUS | Status: DC | PRN
  Administered 2016-11-17 – 2016-11-20 (×13): 4 mg via INTRAVENOUS
  Filled 2016-11-17 (×13): qty 1

## 2016-11-17 MED ORDER — ONDANSETRON 4 MG PO TBDP: 4.0000 mg | ORAL_TABLET | Freq: Four times a day (QID) | ORAL | Status: AC | PRN

## 2016-11-17 MED ORDER — PANTOPRAZOLE SODIUM 40 MG IV SOLR
40.0000 mg | Freq: Two times a day (BID) | INTRAVENOUS | Status: DC
  Administered 2016-11-17 – 2016-11-20 (×7): 40 mg via INTRAVENOUS
  Filled 2016-11-17 (×7): qty 40

## 2016-11-17 MED ORDER — PIPERACILLIN-TAZOBACTAM 3.375 G IVPB: 3.3750 g | Freq: Three times a day (TID) | INTRAVENOUS | Status: DC

## 2016-11-17 MED ORDER — SODIUM CHLORIDE 0.9 % IV BOLUS (SEPSIS): 1000.0000 mL | Freq: Once | INTRAVENOUS | Status: DC

## 2016-11-17 MED ORDER — ADULT MULTIVITAMIN LIQUID CH
15.0000 mL | Freq: Every day | ORAL | Status: DC
  Administered 2016-11-17 – 2016-11-20 (×4): 15 mL via ORAL
  Filled 2016-11-17 (×4): qty 15

## 2016-11-17 MED ORDER — ASPIRIN 81 MG PO CHEW
81.0000 mg | CHEWABLE_TABLET | Freq: Every day | ORAL | Status: DC
  Administered 2016-11-17 – 2016-11-20 (×4): 81 mg via ORAL
  Filled 2016-11-17 (×4): qty 1

## 2016-11-17 MED ORDER — HEPARIN SODIUM (PORCINE) 5000 UNIT/ML IJ SOLN
5000.0000 [IU] | Freq: Three times a day (TID) | INTRAMUSCULAR | Status: DC
  Administered 2016-11-17 – 2016-11-18 (×2): 5000 [IU] via SUBCUTANEOUS
  Filled 2016-11-17 (×2): qty 1

## 2016-11-17 MED ORDER — VANCOMYCIN HCL 10 G IV SOLR
1500.0000 mg | Freq: Once | INTRAVENOUS | Status: AC
  Administered 2016-11-17: 1500 mg via INTRAVENOUS
  Filled 2016-11-17: qty 1500

## 2016-11-17 MED ORDER — PROMETHAZINE HCL 25 MG/ML IJ SOLN
12.5000 mg | Freq: Once | INTRAMUSCULAR | Status: AC
  Administered 2016-11-17: 12.5 mg via INTRAVENOUS
  Filled 2016-11-17: qty 1

## 2016-11-17 MED ORDER — CHLORDIAZEPOXIDE HCL 25 MG PO CAPS
50.0000 mg | ORAL_CAPSULE | Freq: Once | ORAL | Status: DC
  Filled 2016-11-17: qty 2

## 2016-11-17 MED ORDER — CHLORDIAZEPOXIDE HCL 25 MG PO CAPS: 25.0000 mg | ORAL_CAPSULE | Freq: Four times a day (QID) | ORAL | Status: DC | PRN

## 2016-11-17 MED ORDER — LORAZEPAM 2 MG/ML IJ SOLN
INTRAMUSCULAR | Status: AC
  Filled 2016-11-17: qty 1

## 2016-11-17 MED ORDER — SODIUM CHLORIDE 0.9 % IV BOLUS (SEPSIS)
2000.0000 mL | Freq: Once | INTRAVENOUS | Status: AC
  Administered 2016-11-17: 2000 mL via INTRAVENOUS

## 2016-11-17 NOTE — ED Triage Notes (Signed)
Brought by ems from home.  Reports having dry heaves all day yesterday.  Now reporting epigastric pain and nausea as well as chest pressure.  Continues to dry heave in triage.

## 2016-11-17 NOTE — ED Provider Notes (Signed)
[facility] DEPT Provider Note   CSN: [id number] Arrival date & time: 11/17/16  5035     History   Chief Complaint Chief Complaint  Patient presents with  . Abdominal Pain    HPI [name] is a 56 y.o. female.  HPI   Ill appearing 56 year old female presenting with abdominal pain and shaking. Patient reports that yesterday she started with dry heaving. She reports she has not been able to eat or drink anything for 24 hours. Patient is a daily drinker. Patient has epigastric pain.  Past medical history significant for CAD, ASD, anxiety, daily drinker, cardiomyopathy, Schatzki ring requiring dilatation.  She reports no sick contacts no urinary symptoms, no cough. Past Medical History:  Diagnosis Date  . Allergy    SEASONAL  . Anemia   . Anxiety   . Arthritis   . ASD (atrial septal defect)    a. s/p ASD repair in 1973  . CAD (coronary artery disease)    a. 12/2015: NSTEMI occurring after cardiac arrest secondary to aspiration PNA. Echo w/ EF of 55-60%, no WMA. Outpt ischemic eval needed.  . Cardiomyopathy   . Deaf   . Depression   . Dysphagia    due to esophageal infections.  . Fibromyalgia   . Gastritis   . GERD (gastroesophageal reflux disease)   . Hearing impairment   . Hypertension    [name], take htn medication to regulate heart beat.  . Migraine   . Osteoporosis   . Pulmonary embolism ([place])    occured post c-section of her daughter  . Sleep apnea   . Vitamin D deficiency     Patient Active Problem List   Diagnosis Date Noted  . Pancytopenia ([place])   . Candida infection, esophageal ([place])   . Esophageal ring, acquired   . Neurocardiogenic syncope 07/19/2016  . Leukocytopenia 07/19/2016  . Macrocytosis 07/19/2016  . Abdominal pain, epigastric   . Bradycardia   . Blepharitis of both eyes 02/03/2016  . Cardiac arrest ([place]) 12/22/2015  . Uncontrolled hypertension   . Aspiration pneumonia of right lower lobe due to gastric secretions ([place])   .  Hypokalemia   . Pneumonia   . NSTEMI (non-ST elevated myocardial infarction) ([place]) 12/19/2015  . Typical atrial flutter ([place])   . Pressure ulcer 12/18/2015  . Protein-calorie malnutrition, severe 12/18/2015  . Metabolic acidosis 46/56/8127  . Lactic acidosis 12/17/2015  . Leucocytosis 12/17/2015  . Dehydration 12/17/2015  . Starvation ketoacidosis 12/17/2015  . Alcohol abuse 12/17/2015  . Increased anion gap metabolic acidosis 51/70/0174  . Essential hypertension 12/06/2015  . Accidental drug overdose 10/26/2015  . Aspiration pneumonia ([place]) 10/23/2015  . Acute respiratory failure with hypoxia ([place]) 10/23/2015  . Acute kidney injury ([place]) 10/23/2015  . Transaminitis 10/23/2015  . Positive blood culture 10/23/2015  . Elevated troponin 10/23/2015  . Thrombocytopenia ([place]) 10/23/2015  . Normocytic anemia 10/23/2015  . Shock ([place]) 10/19/2015  . Major depressive disorder, recurrent episode, moderate ([place]) 09/18/2014  . Non-traumatic compression fracture of vertebral column with routine healing 07/17/2014  . Osteoporosis 07/17/2014  . H/O vitamin D deficiency 07/17/2014  . Odynophagia 07/09/2014  . Esophageal dysphagia 07/08/2014  . Chest pain 05/20/2014  . Nausea & vomiting 05/20/2014  . 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 Major depression, chronic ([place]) 03/29/2014  . Breath shortness 03/29/2014  . Hypercholesterolemia without hypertriglyceridemia 03/29/2014  . Fatigue 03/29/2014  . Classical migraine with intractable migraine 03/29/2014  . Disorder of mitral valve 03/29/2014  .  Atypical migraine 03/29/2014  . Atrial septal defect of fossa ovalis 03/29/2014  . HLD (hyperlipidemia) 03/29/2014  . Arthralgia of multiple joints 03/29/2014  . Anemia, iron deficiency 03/29/2014  . Fibrositis 03/29/2014  . Abdominal pain 12/20/2013  . Absolute anemia 09/20/2013  . Bronchitis 09/20/2013  . Vaginitis and vulvovaginitis 09/20/2013  . Family  history of breast cancer 07/13/2013  . Palpitation 07/02/2013  . History of mitral valve repair 07/02/2013  . Status post patch closure of ASD 07/02/2013  . Hyperlipidemia 07/02/2013  . OAB (overactive bladder) 06/13/2013  . Ovarian cyst, left 03/15/2013  . Vaginal lesion 03/15/2013  . Ovarian mass 03/15/2013  . Maternal DVT (deep vein thrombosis), history of 02/26/2013  . PMB (postmenopausal bleeding) 02/26/2013  . Alcohol dependence ([place]) 11/07/2012  . Unspecified vitamin D deficiency 08/17/2012  . L1 vertebral fracture ([place]) 08/01/2012  . Spinal compression fracture ([place]) 07/12/2012  . Menopausal state 07/12/2012  . Psoriasis 07/12/2012  . Severe episode of recurrent major depressive disorder ([place]) 12/07/2011  . Generalized anxiety disorder 12/07/2011  . Cardiomyopathy 03/16/2011  . Hearing impairment   . Migraine   . Arthritis     Past Surgical History:  Procedure Laterality Date  . APPENDECTOMY    . CARDIAC SURGERY  January 2007   mitral valve repair  . CARDIAC SURGERY  1973   atrial septal defect  . CARDIAC SURGERY    . COCHLEAR IMPLANT    . ESOPHAGOGASTRODUODENOSCOPY (EGD) WITH PROPOFOL N/A 07/20/2016   Procedure: ESOPHAGOGASTRODUODENOSCOPY (EGD) WITH PROPOFOL;  Surgeon: Gatha Mayer, MD;  Location: [facility];  Service: Endoscopy;  Laterality: N/A;  . INNER EAR SURGERY     TUBES  . LAPAROSCOPIC CHOLECYSTECTOMY  2012  . TONSILLECTOMY AND ADENOIDECTOMY    . TUBAL LIGATION      OB History    Gravida Para Term Preterm AB Living   _0 0 0     SAB TAB Ectopic Multiple Live Births   0 0 0           Home Medications    Prior to Admission medications   Medication Sig Start Date End Date Taking? Authorizing Provider  aspirin 81 MG chewable tablet Chew 81 mg by mouth daily.   Yes [provider]  atorvastatin (LIPITOR) 40 MG tablet Take 1 tablet (40 mg total) by mouth daily at 6 PM. 02/02/16  Yes [name], MD  blood glucose meter kit and supplies  KIT Dispense based on patient and insurance preference. Use up to SIX times daily as directed. (FOR ICD-10 e16.21). 08/13/16  Yes [name], MD  Cholecalciferol (VITAMIN D3) 2000 units TABS Take 2,000 Units by mouth daily.    Yes [provider]  fexofenadine (ALLEGRA ALLERGY) 180 MG tablet Take 180 mg by mouth daily.   Yes [provider]  fluticasone (FLONASE) 50 MCG/ACT nasal spray Place 2 sprays into both nostrils daily.   Yes [provider]  HYDROcodone-acetaminophen (NORCO/VICODIN) 5-325 MG tablet Take 1 tablet by mouth every 6 (six) hours as needed for moderate pain or severe pain. 10/31/16  Yes Charlesetta Shanks, MD  metoprolol tartrate (LOPRESSOR) 25 MG tablet Take 1 tablet (25 mg total) by mouth 2 (two) times daily. 07/22/16  Yes [name], MD  nitroGLYCERIN (NITROSTAT) 0.4 MG SL tablet Place 1 tablet (0.4 mg total) under the tongue every 5 (five) minutes as needed for chest pain. 05/11/16  Yes [name], MD  Nutritional Supplements (HIGH-PROTEIN NUTRITIONAL  SHAKE) LIQD Drink between meals three times a day. Ok to use brand and flavor of pt's choice. No ingredient restrictions. 07/22/16  Yes [name], MD  Omega-3 Fatty Acids (FISH OIL) 1000 MG CAPS Take 1,000 mg by mouth 2 (two) times daily.    Yes [provider]  ondansetron (ZOFRAN ODT) 4 MG disintegrating tablet Take 1 tablet (4 mg total) by mouth every 8 (eight) hours as needed for nausea. 11/08/15  Yes [name], MD  pantoprazole (PROTONIX) 20 MG tablet Take 1 tablet (20 mg total) by mouth daily. 10/31/16  Yes Pfeiffer, Jeannie Done, MD  prazosin (MINIPRESS) 2 MG capsule Take 2 mg by mouth at bedtime.  02/17/16  Yes [provider]  [place] into the lungs at bedtime. CPAP   Yes [provider]  Vilazodone HCl (VIIBRYD) 40 MG TABS Take 40 mg by mouth daily.   Yes [provider]  vitamin B-12 (CYANOCOBALAMIN) 500 MCG tablet Take 500 mcg by mouth daily.   Yes  [provider]    Family History Family History  Problem Relation Age of Onset  . Breast cancer Mother        bilateral; ages 39 and 59; TAH/BSO ~50  . Depression Sister   . Heart disease Father   . Hypertension Father   . Heart attack Father   . Colon cancer Neg Hx   . Esophageal cancer Neg Hx   . Pancreatic cancer Neg Hx   . Rectal cancer Neg Hx   . Stomach cancer Neg Hx     Social History Social History  Substance Use Topics  . Smoking status: Never Smoker  . Smokeless tobacco: Never Used  . 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     Allergies   Depakote [divalproex sodium]; Diazepam; Valium; and Tizanidine hcl   Review of Systems Review of Systems  Constitutional: Positive for activity change, chills and fatigue.  Respiratory: Negative for shortness of breath.   Cardiovascular: Positive for chest pain.  Gastrointestinal: Positive for abdominal pain, nausea and vomiting.  All other systems reviewed and are negative.    Physical Exam Updated Vital Signs BP 131/90   Pulse 89   Temp (!) 100.9 F (38.3 C) (Oral)   Resp (!) 23   Ht _0  (1.6 m)   Wt 59 kg (130 lb)   SpO2 100%   BMI 23.03 kg/m   Physical Exam  Constitutional: She is oriented to person, place, and time. She appears well-developed.  Ill appearing shaking female Dry heaving on exam.  HENT:  Head: Normocephalic and atraumatic.  Dry mucous membranes.  Eyes: Right eye exhibits no discharge. Left eye exhibits no discharge.  Cardiovascular: Regular rhythm and normal heart sounds.   No murmur heard. Tachycardic.  Pulmonary/Chest: Effort normal and breath sounds normal. She has no wheezes. She has no rales.  Abdominal: Soft. She exhibits no distension. There is tenderness.  Tenderness to abdomen diffusely, worse in the epigastrium.  Neurological: She is oriented to person, place, and time.  Skin: Skin is warm and dry. She is not  diaphoretic.  Psychiatric:  Anxious.  Nursing note and vitals reviewed.    ED Treatments / Results  Labs (all labs ordered are listed, but only abnormal results are displayed) Labs Reviewed  BASIC METABOLIC PANEL - Abnormal; Notable for the following:       Result Value   Chloride 96 (*)  Glucose, Bld 105 (*)    Anion gap 18 (*)    All other components within normal limits  CBC - Abnormal; Notable for the following:    RDW 15.6 (*)    All other components within normal limits  HEPATIC FUNCTION PANEL - Abnormal; Notable for the following:    AST 65 (*)    Alkaline Phosphatase 138 (*)    Total Bilirubin 1.8 (*)    Indirect Bilirubin 1.5 (*)    All other components within normal limits  LIPASE, BLOOD - Abnormal; Notable for the following:    Lipase 66 (*)    All other components within normal limits  I-STAT CG4 LACTIC ACID, ED - Abnormal; Notable for the following:    Lactic Acid, Venous 2.71 (*)    All other components within normal limits  CULTURE, BLOOD (ROUTINE X 2)  CULTURE, BLOOD (ROUTINE X 2)  ETHANOL  URINALYSIS, ROUTINE W REFLEX MICROSCOPIC  I-STAT TROPOININ, ED  I-STAT CG4 LACTIC ACID, ED    EKG  EKG Interpretation  Date/Time:  Wednesday November 17 2016 06:37:34 EDT Ventricular Rate:  91 PR Interval:    QRS Duration: 93 QT Interval:  492 QTC Calculation: 606 R Axis:   31 Text Interpretation:  Sinus rhythm Low voltage, extremity and precordial leads Nonspecific T abnrm, anterolateral leads Prolonged QT interval No significant change since last tracing No significant change since last tracing prolonged qt like last ekg Reconfirmed by [place], Adalina Dopson [phone]) on 11/17/2016 7:03:52 AM       Radiology Dg Chest 2 View  Result Date: 11/17/2016 CLINICAL DATA:  Epigastric pain and nausea with chest pressure. EXAM: CHEST  2 VIEW COMPARISON:  07/17/2016 FINDINGS: Previous median sternotomy and valve replacement. Heart size is normal. Small hiatal hernia.  Mediastinal shadows are otherwise normal. The lungs are clear. The vascularity is normal. No effusions. Focal eventration of the left posterior hemidiaphragm. Old thoracic compression deformities. IMPRESSION: Previous valve replacement.  No active cardiopulmonary disease. Electronically Signed   By: Nelson Chimes M.D.   On: 11/17/2016 07:34   Ct Abdomen Pelvis W Contrast  Result Date: 11/17/2016 CLINICAL DATA:  Abdominal pain with nausea and vomiting EXAM: CT ABDOMEN AND PELVIS WITH CONTRAST TECHNIQUE: Multidetector CT imaging of the abdomen and pelvis was performed using the standard protocol following bolus administration of intravenous contrast. CONTRAST:  16m ISOVUE-300 IOPAMIDOL (ISOVUE-300) INJECTION 61% COMPARISON:  March 15, 2016 FINDINGS: Lower chest: Lung bases are clear. Prosthetic mitral valve evident. There is a focal hiatal hernia. Hepatobiliary: There is hepatic steatosis. Hepatic steatosis appears somewhat more prominent in the posterior liver and near the falciform ligament, unchanged from prior study. No evident focal liver lesion. Caudate lobe of the liver is somewhat prominent. Lateral segment of the left lobe of the liver is also prominent, stable from prior study. Gallbladder is absent. There is no appreciable biliary duct dilatation. Pancreas: There is no pancreatic mass for inflammatory focus. Spleen: No splenic lesions are evident. Adrenals/Urinary Tract: Adrenals appear unremarkable bilaterally. There is no evident renal mass or hydronephrosis on either side. There is no renal or ureteral calculus. Urinary bladder is midline with wall thickness within normal limits. Stomach/Bowel: There are scattered colonic diverticula without diverticulitis. There is no bowel wall or mesenteric thickening. No bowel obstruction. No free air or portal venous air. Vascular/Lymphatic: There are foci of atherosclerotic calcification in the aorta. There is no abdominal aortic aneurysm. Major mesenteric  vessels appear patent. A small amount of air is noted in  the left common femoral vein. There is no appreciable adenopathy in the abdomen or pelvis. Reproductive: Uterus is anteverted.  No pelvic mass evident. Other: Appendix absent. No abscess or ascites evident in the abdomen or pelvis. There is a minimal ventral hernia containing only fat. Musculoskeletal: There is stable prior compression of the L1 vertebral body with retropulsion of bone along the posterosuperior aspect of L1, stable. There is degenerative change throughout the lumbar spine. No blastic or lytic bone lesions are evident. There is evidence of an old T12 rib fracture on the left posteriorly. There is no intramuscular or abdominal wall lesion. IMPRESSION: 1. No bowel wall thickening or bowel obstruction. No evident abscess. Appendix absent. 2.  No renal or ureteral calculus.  No hydronephrosis. 3. Focal hiatal hernia. Minimal ventral hernia containing only fat. 4. Persistent hepatic steatosis. Prominence of the lateral segment of the left lobe of the liver and caudate lobe, raising question of a degree of hepatic cirrhosis. No focal liver lesions evident. 5. Small amount of air in the left common femoral vein. Question recent cannulation of this vessel. 6.  Aortic atherosclerosis.  No aneurysm. 7. Old trauma at L1 with a degree of retropulsion of bone into the canal at this level, stable. No high-grade stenosis. Old left T12 rib fracture. 8. Gallbladder and appendix absent. Status post mitral valve repair. Aortic Atherosclerosis (ICD10-I70.0). Electronically Signed   By: Lowella Grip III M.D.   On: 11/17/2016 09:40    Procedures Procedures (including critical care time)  Medications Ordered in ED Medications  chlordiazePOXIDE (LIBRIUM) capsule 50 mg (0 mg Oral Hold 11/17/16 0836)  dextrose 5 % and 0.45 % NaCl with KCl 20 mEq/L infusion (not administered)  vancomycin (VANCOCIN) 1,500 mg in sodium chloride 0.9 % 500 mL IVPB (not  administered)  piperacillin-tazobactam (ZOSYN) IVPB 3.375 g (not administered)  vancomycin (VANCOCIN) 500 mg in sodium chloride 0.9 % 100 mL IVPB (not administered)  fentaNYL (SUBLIMAZE) injection 50 mcg (not administered)  sodium chloride 0.9 % bolus 2,000 mL (0 mLs Intravenous Stopped 11/17/16 0835)  ondansetron (ZOFRAN) injection 4 mg (4 mg Intravenous Given 11/17/16 0655)  fentaNYL (SUBLIMAZE) injection 50 mcg (50 mcg Intravenous Given 11/17/16 0836)  piperacillin-tazobactam (ZOSYN) IVPB 3.375 g (3.375 g Intravenous New Bag/Given 11/17/16 0932)  promethazine (PHENERGAN) injection 12.5 mg (12.5 mg Intravenous Given 11/17/16 0835)  iopamidol (ISOVUE-300) 61 % injection (100 mLs  Contrast Given 11/17/16 0907)     Initial Impression / Assessment and Plan / ED Course  I have reviewed the triage vital signs and the nursing notes.  Pertinent labs & imaging results that were available during my care of the patient were reviewed by me and considered in my medical decision making (see chart for details).     Patient's ill-appearing female presenting with alcohol withdrawal and abdominal pain. Patient has mild fever as well. Concern for pancreatitis versus gastritis versus alcohol withdrawal. Patient has also history of candida deal esophagitis with Schatzki's ring. These symptoms are similar to when she was diagnosed with last. Patient had follow-up with a EGD yesterday but was unable to go.  Anion gap potentially from alcoholic ketoacidosis. Versus lactic acidosis.  Patient treated with broad spectrum antibiotics because the fever elevated lactic and valve procedure.  Patient had allergy listed to Valium "death operating table". I reviewed patient's files with our pharmacist. IIt appears that she got Ativan last year same in August. Patient also has received multiple prescriptions and has taken Librium without issue. We will treat  her nausea and give her Librium today.  Patient's CAT scan shows no  surgical process. Concerned today for narrowing of the esophagus, versus alcohol wihdrwal versus chronic pancreatitis. Will admit to follow up cultures, slowly advance diet.    Final Clinical Impressions(s) / ED Diagnoses   Final diagnoses:  None    New Prescriptions New Prescriptions   No medications on file     Macarthur Critchley, MD 11/17/16 1047

## 2016-11-17 NOTE — H&P (Signed)
History and Physical    [name] OZD:[id number] DOB: 03/01/1961 DOA: 11/17/2016  PCP: [name], MD Patient coming from: home  Chief Complaint: abdominal pain  HPI: [name] is a 56 y.o. female with medical history significant of abdominal pain. Started 1 day ago. Associated with shaking, nausea and vomiting. Vomiting is described as dry heaving. Has not anything to eat or drink before hours without improvement in symptoms. Pain is located in the epigastric region without radiation. Patient states that she drinks a 10-16 ounce bottle of vodka every day. Last alcoholic beverage was approximately 48-60 hours prior to admission. Denies dysuria, frequency, chest pain, palpitations, neck stiffness, LOC, focal neurological deficit, rash.  ED Course: Objective findings outlined below.  Review of Systems: As per HPI otherwise all other systems reviewed and are negative  Ambulatory Status: Generalized weakness but no specific difficulties with ambulation.  Past Medical History:  Diagnosis Date  . Allergy    SEASONAL  . Anemia   . Anxiety   . Arthritis   . ASD (atrial septal defect)    a. s/p ASD repair in 1973  . CAD (coronary artery disease)    a. 12/2015: NSTEMI occurring after cardiac arrest secondary to aspiration PNA. Echo w/ EF of 55-60%, no WMA. Outpt ischemic eval needed.  . Cardiomyopathy   . Deaf   . Depression   . Dysphagia    due to esophageal infections.  . Fibromyalgia   . Gastritis   . GERD (gastroesophageal reflux disease)   . Hearing impairment   . Hypertension    [name], take htn medication to regulate heart beat.  . Migraine   . Osteoporosis   . Pulmonary embolism ([place])    occured post c-section of her daughter  . Sleep apnea   . Vitamin D deficiency     Past Surgical History:  Procedure Laterality Date  . APPENDECTOMY    . CARDIAC SURGERY  January 2007   mitral valve repair  . CARDIAC SURGERY  1973   atrial septal defect  . CARDIAC SURGERY     . COCHLEAR IMPLANT    . ESOPHAGOGASTRODUODENOSCOPY (EGD) WITH PROPOFOL N/A 07/20/2016   Procedure: ESOPHAGOGASTRODUODENOSCOPY (EGD) WITH PROPOFOL;  Surgeon: Gatha Mayer, MD;  Location: [facility];  Service: Endoscopy;  Laterality: N/A;  . INNER EAR SURGERY     TUBES  . LAPAROSCOPIC CHOLECYSTECTOMY  2012  . TONSILLECTOMY AND ADENOIDECTOMY    . TUBAL LIGATION      Social History   Social History  . Marital status: Single    Spouse name: N/A  . Number of children: N/A  . Years of education: N/A   Occupational History  . Not on file.   Social History Main Topics  . Smoking status: Never Smoker  . Smokeless tobacco: Never Used  . 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  . Drug use: No  . Sexual activity: Yes    Birth control/ protection: Surgical     Comment: 1st intercourse- 19, partners- 6   Other Topics Concern  . Not on file   Social History Narrative   Lives with 54 year old daughter.        Allergies  Allergen Reactions  . Depakote [Divalproex Sodium] Anaphylaxis  . Diazepam Other (See Comments)    Died on operating table. TOLERATES ATIVAN AND LIBRIUM W/O DIFFICULTY  . Valium Anaphylaxis    Tolerates Ativan and Librium  w/o complication  . Tizanidine Hcl Other (See Comments)    "passed out"    Family History  Problem Relation Age of Onset  . Breast cancer Mother        bilateral; ages 73 and 71; TAH/BSO ~50  . Depression Sister   . Heart disease Father   . Hypertension Father   . Heart attack Father   . Colon cancer Neg Hx   . Esophageal cancer Neg Hx   . Pancreatic cancer Neg Hx   . Rectal cancer Neg Hx   . Stomach cancer Neg Hx       Prior to Admission medications   Medication Sig Start Date End Date Taking? Authorizing Provider  aspirin 81 MG chewable tablet Chew 81 mg by mouth daily.   Yes [provider]  atorvastatin (LIPITOR) 40 MG tablet Take 1 tablet (40 mg total) by mouth daily  at 6 PM. 02/02/16  Yes [name], MD  blood glucose meter kit and supplies KIT Dispense based on patient and insurance preference. Use up to SIX times daily as directed. (FOR ICD-10 e16.21). 08/13/16  Yes [name], MD  Cholecalciferol (VITAMIN D3) 2000 units TABS Take 2,000 Units by mouth daily.    Yes [provider]  fexofenadine (ALLEGRA ALLERGY) 180 MG tablet Take 180 mg by mouth daily.   Yes [provider]  fluticasone (FLONASE) 50 MCG/ACT nasal spray Place 2 sprays into both nostrils daily.   Yes [provider]  HYDROcodone-acetaminophen (NORCO/VICODIN) 5-325 MG tablet Take 1 tablet by mouth every 6 (six) hours as needed for moderate pain or severe pain. 10/31/16  Yes Charlesetta Shanks, MD  metoprolol tartrate (LOPRESSOR) 25 MG tablet Take 1 tablet (25 mg total) by mouth 2 (two) times daily. 07/22/16  Yes [name], MD  nitroGLYCERIN (NITROSTAT) 0.4 MG SL tablet Place 1 tablet (0.4 mg total) under the tongue every 5 (five) minutes as needed for chest pain. 05/11/16  Yes [name], MD  Nutritional Supplements (HIGH-PROTEIN NUTRITIONAL SHAKE) LIQD Drink between meals three times a day. Ok to use brand and flavor of pt's choice. No ingredient restrictions. 07/22/16  Yes [name], MD  Omega-3 Fatty Acids (FISH OIL) 1000 MG CAPS Take 1,000 mg by mouth 2 (two) times daily.    Yes [provider]  ondansetron (ZOFRAN ODT) 4 MG disintegrating tablet Take 1 tablet (4 mg total) by mouth every 8 (eight) hours as needed for nausea. 11/08/15  Yes [name], MD  pantoprazole (PROTONIX) 20 MG tablet Take 1 tablet (20 mg total) by mouth daily. 10/31/16  Yes Pfeiffer, Jeannie Done, MD  prazosin (MINIPRESS) 2 MG capsule Take 2 mg by mouth at bedtime.  02/17/16  Yes [provider]  [place] into the lungs at bedtime. CPAP   Yes [provider]  Vilazodone HCl (VIIBRYD) 40 MG TABS Take 40 mg by mouth daily.   Yes [provider]    vitamin B-12 (CYANOCOBALAMIN) 500 MCG tablet Take 500 mcg by mouth daily.   Yes [provider]    Physical Exam: Vitals:   11/17/16 1030 11/17/16 1200 11/17/16 1209 11/17/16 1230  BP: 125/81 130/83 130/83 (!) 141/87  Pulse: 89 91 (!) 103 99  Resp: '12 18  11  ' Temp:      TempSrc:      SpO2: 100% 100%  100%  Weight:      Height:         General:  Appears older than stated age, resting in bed comfortably. Eyes:  PERRL, EOMI, normal lids, iris ENT: Dry mucous membranes, normal hearing. Neck:  no LAD, masses or thyromegaly Cardiovascular:  RRR, no m/r/g. No LE edema.  Respiratory:  CTA bilaterally, no w/r/r. Normal respiratory effort. Abdomen:  soft, ntnd, NABS Skin:  no rash or induration seen on limited exam Musculoskeletal:  grossly normal tone BUE/BLE, good ROM, no bony abnormality Psychiatric:  grossly normal mood and affect, speech fluent and appropriate, AOx3 Neurologic:  Resting tremor. CN 2-12 grossly intact, moves all extremities in coordinated fashion, sensation intact  Labs on Admission: I have personally reviewed following labs and imaging studies  CBC:  Recent Labs Lab 11/17/16 0645  WBC 4.0  HGB 13.1  HCT 38.5  MCV 97.7  PLT 161   Basic Metabolic Panel:  Recent Labs Lab 11/17/16 0645  NA 136  K 4.1  CL 96*  CO2 22  GLUCOSE 105*  BUN 11  CREATININE 0.87  CALCIUM 8.9  MG 1.4*  PHOS 2.5   GFR: Estimated Creatinine Clearance: 59.7 mL/min (by C-G formula based on SCr of 0.87 mg/dL). Liver Function Tests:  Recent Labs Lab 11/17/16 0645  AST 65*  ALT 24  ALKPHOS 138*  BILITOT 1.8*  PROT 6.8  ALBUMIN 3.9    Recent Labs Lab 11/17/16 0645  LIPASE 66*   No results for input(s): AMMONIA in the last 168 hours. Coagulation Profile: No results for input(s): INR, PROTIME in the last 168 hours. Cardiac Enzymes: No results for input(s): CKTOTAL, CKMB, CKMBINDEX, TROPONINI in the last 168 hours. BNP (last 3 results) No results for  input(s): PROBNP in the last 8760 hours. HbA1C: No results for input(s): HGBA1C in the last 72 hours. CBG: No results for input(s): GLUCAP in the last 168 hours. Lipid Profile: No results for input(s): CHOL, HDL, LDLCALC, TRIG, CHOLHDL, LDLDIRECT in the last 72 hours. Thyroid Function Tests: No results for input(s): TSH, T4TOTAL, FREET4, T3FREE, THYROIDAB in the last 72 hours. Anemia Panel: No results for input(s): VITAMINB12, FOLATE, FERRITIN, TIBC, IRON, RETICCTPCT in the last 72 hours. Urine analysis:    Component Value Date/Time   COLORURINE YELLOW 11/17/2016 1236   APPEARANCEUR CLEAR 11/17/2016 1236   LABSPEC >1.046 (H) 11/17/2016 1236   PHURINE 7.0 11/17/2016 1236   GLUCOSEU NEGATIVE 11/17/2016 1236   HGBUR SMALL (A) 11/17/2016 1236   BILIRUBINUR NEGATIVE 11/17/2016 1236   BILIRUBINUR small (A) 09/28/2016 1136   KETONESUR 80 (A) 11/17/2016 1236   PROTEINUR NEGATIVE 11/17/2016 1236   UROBILINOGEN 1.0 09/28/2016 1136   UROBILINOGEN 0.2 12/15/2014 2213   NITRITE NEGATIVE 11/17/2016 1236   LEUKOCYTESUR NEGATIVE 11/17/2016 1236    Creatinine Clearance: Estimated Creatinine Clearance: 59.7 mL/min (by C-G formula based on SCr of 0.87 mg/dL).  Sepsis Labs: '@LABRCNTIP' (procalcitonin:4,lacticidven:4) )No results found for this or any previous visit (from the past 240 hour(s)).   Radiological Exams on Admission: Dg Chest 2 View  Result Date: 11/17/2016 CLINICAL DATA:  Epigastric pain and nausea with chest pressure. EXAM: CHEST  2 VIEW COMPARISON:  07/17/2016 FINDINGS: Previous median sternotomy and valve replacement. Heart size is normal. Small hiatal hernia. Mediastinal shadows are otherwise normal. The lungs are clear. The vascularity is normal. No effusions. Focal eventration of the left posterior hemidiaphragm. Old thoracic compression deformities. IMPRESSION: Previous valve replacement.  No active cardiopulmonary disease. Electronically Signed   By: Nelson Chimes M.D.   On:  11/17/2016 07:34   Ct Abdomen Pelvis W Contrast  Result Date:  11/17/2016 CLINICAL DATA:  Abdominal pain with nausea and vomiting EXAM: CT ABDOMEN AND PELVIS WITH CONTRAST TECHNIQUE: Multidetector CT imaging of the abdomen and pelvis was performed using the standard protocol following bolus administration of intravenous contrast. CONTRAST:  155m ISOVUE-300 IOPAMIDOL (ISOVUE-300) INJECTION 61% COMPARISON:  March 15, 2016 FINDINGS: Lower chest: Lung bases are clear. Prosthetic mitral valve evident. There is a focal hiatal hernia. Hepatobiliary: There is hepatic steatosis. Hepatic steatosis appears somewhat more prominent in the posterior liver and near the falciform ligament, unchanged from prior study. No evident focal liver lesion. Caudate lobe of the liver is somewhat prominent. Lateral segment of the left lobe of the liver is also prominent, stable from prior study. Gallbladder is absent. There is no appreciable biliary duct dilatation. Pancreas: There is no pancreatic mass for inflammatory focus. Spleen: No splenic lesions are evident. Adrenals/Urinary Tract: Adrenals appear unremarkable bilaterally. There is no evident renal mass or hydronephrosis on either side. There is no renal or ureteral calculus. Urinary bladder is midline with wall thickness within normal limits. Stomach/Bowel: There are scattered colonic diverticula without diverticulitis. There is no bowel wall or mesenteric thickening. No bowel obstruction. No free air or portal venous air. Vascular/Lymphatic: There are foci of atherosclerotic calcification in the aorta. There is no abdominal aortic aneurysm. Major mesenteric vessels appear patent. A small amount of air is noted in the left common femoral vein. There is no appreciable adenopathy in the abdomen or pelvis. Reproductive: Uterus is anteverted.  No pelvic mass evident. Other: Appendix absent. No abscess or ascites evident in the abdomen or pelvis. There is a minimal ventral hernia  containing only fat. Musculoskeletal: There is stable prior compression of the L1 vertebral body with retropulsion of bone along the posterosuperior aspect of L1, stable. There is degenerative change throughout the lumbar spine. No blastic or lytic bone lesions are evident. There is evidence of an old T12 rib fracture on the left posteriorly. There is no intramuscular or abdominal wall lesion. IMPRESSION: 1. No bowel wall thickening or bowel obstruction. No evident abscess. Appendix absent. 2.  No renal or ureteral calculus.  No hydronephrosis. 3. Focal hiatal hernia. Minimal ventral hernia containing only fat. 4. Persistent hepatic steatosis. Prominence of the lateral segment of the left lobe of the liver and caudate lobe, raising question of a degree of hepatic cirrhosis. No focal liver lesions evident. 5. Small amount of air in the left common femoral vein. Question recent cannulation of this vessel. 6.  Aortic atherosclerosis.  No aneurysm. 7. Old trauma at L1 with a degree of retropulsion of bone into the canal at this level, stable. No high-grade stenosis. Old left T12 rib fracture. 8. Gallbladder and appendix absent. Status post mitral valve repair. Aortic Atherosclerosis (ICD10-I70.0). Electronically Signed   By: WLowella GripIII M.D.   On: 11/17/2016 09:40    EKG: Independently reviewed. Tremor artifact, nonspecific T-wave changes, no ACS.  Assessment/Plan Active Problems:   Abdominal pain   Nausea & vomiting   Esophageal dysphagia   Transaminitis   Increased anion gap metabolic acidosis   Essential hypertension   Lactic acidosis   Alcohol abuse   Pancreatitis    Pancreatitis: mild w/ lipase of 66. CT on my read w/ mild inflammatory changes around the middle. Suspect majority of pts abd pain, n/v are from ETOH withdrawal.  - IVF - Toradol, Morhpine - NPO  Lactic acidosis: Anion gap 18. EtOH induced. Initial lactic acid 2.7 with downtrending to the normal range after aggressive  IV  hydration. Remainder of chemistries essentially normal with bicarbonate of 22.-  IVF - EtOH cessation - BMP in am  EtOH abuse: Patient drinks 10-16 ounces of vodka per day at baseline. Last alcoholic beverage approximately 60 hours prior to admission. - CIWA protocol - Patient to continue meeting with her substance abuse counselor at time of discharge.  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 Alkaline phosphatase 138, AST 65, ALT 24, total protein 6.8, total bilirubin 1.8. MELD 9.  - ETOH cessation  Reflux esophagitis: Resulting in dysphasia. Patient scheduled to have an EGD performed on the day prior to admission but was unable to go due to symptoms. - Continue home PPI - Carafate - Follow-up GI outpatient  Infectious: Patient was started on vancomycin and Zosyn empirically by EDP due to overall constellation of symptoms and concern for infectious process. Ongoing workup has been unremarkable including negative UA, negative CT abdomen, negative CXR. Patient febrile to 11 in the ED but I feel this is likely secondary to withdrawal and persistent tremors. Patient may have a viral gastroenteritis. Blood cultures have been drawn and sent. - Hold further antibiotics and follow up on cultures.  H/O NSTEMI: EKG w/o ACS.  - continue ASA  HLD: - continue statin  Depression:  - Vilazodone  HTN: - continue metop  DVT prophylaxis: Hep  Code Status: full  Family Communication: none  Disposition Plan: pending improvement  Consults called: none  Admission status: observation    Shean Gerding J MD [facility]  If 7PM-7AM, please contact night-coverage [email or web address] Password [email or web address]  11/17/2016, 1:38 PM

## 2016-11-17 NOTE — ED Notes (Signed)
Patient transported to CT 

## 2016-11-17 NOTE — ED Notes (Signed)
Report called per [place] have to wait for room to be ready

## 2016-11-17 NOTE — ED Notes (Signed)
Lab to draw blood cultures prior to antibiotic therapy.

## 2016-11-17 NOTE — Progress Notes (Signed)
Pharmacy Antibiotic Note  [name] is a 56 y.o. female admitted on 11/17/2016 with rule out sepsis.  Pharmacy has been consulted for Vancomycin and Zosyn dosing. Patient given Vanc 1500mg  LD, Zosyn 3.375g LD.  WBC 4, Temp 100.48F, Scr 0.87 (CrCl 59)  Plan: Vancomycin 500mg  IV every 12 hours.  Goal trough 15-20 mcg/mL. Zosyn 3.375g IV q8h (4 hour infusion).  F/u Vanc tr, Scr, electrolytes  Height: 5\' 3"  (160 cm) Weight: 130 lb (59 kg) IBW/kg (Calculated) : 52.4   Recent Labs Lab 11/17/16 0645 11/17/16 0656  WBC 4.0  --   CREATININE 0.87  --   LATICACIDVEN  --  2.71*    Estimated Creatinine Clearance: 59.7 mL/min (by C-G formula based on SCr of 0.87 mg/dL).    Allergies  Allergen Reactions  . Depakote [Divalproex Sodium] Anaphylaxis  . Diazepam Other (See Comments)    Died on operating table  . Valium Anaphylaxis  . Tizanidine Hcl Other (See Comments)    "passed out"    Antimicrobials this admission: Vancomycin 7/11 >>  Zosyn 7/11 >>   Dose adjustments this admission: Vancomycin 1,500 mg load >> on maintenance dose Vanc 500mg  q12h  Microbiology results: BCx: order put in, waiting to be collected UCx: order put in, waiting to be collected   Thank you for allowing pharmacy to be a part of this patient's care.  Nida Boatman, PharmD PGY1 Acute Care Pharmacy Resident Pager: [phone] 11/17/2016 8:37 AM

## 2016-11-18 DIAGNOSIS — M797 Fibromyalgia: Secondary | ICD-10-CM | POA: Diagnosis present

## 2016-11-18 DIAGNOSIS — I252 Old myocardial infarction: Secondary | ICD-10-CM | POA: Diagnosis not present

## 2016-11-18 DIAGNOSIS — G4730 Sleep apnea, unspecified: Secondary | ICD-10-CM | POA: Diagnosis present

## 2016-11-18 DIAGNOSIS — M1990 Unspecified osteoarthritis, unspecified site: Secondary | ICD-10-CM | POA: Diagnosis present

## 2016-11-18 DIAGNOSIS — K852 Alcohol induced acute pancreatitis without necrosis or infection: Secondary | ICD-10-CM | POA: Diagnosis present

## 2016-11-18 DIAGNOSIS — K210 Gastro-esophageal reflux disease with esophagitis: Secondary | ICD-10-CM | POA: Diagnosis present

## 2016-11-18 DIAGNOSIS — Z9621 Cochlear implant status: Secondary | ICD-10-CM | POA: Diagnosis present

## 2016-11-18 DIAGNOSIS — F1010 Alcohol abuse, uncomplicated: Secondary | ICD-10-CM | POA: Diagnosis not present

## 2016-11-18 DIAGNOSIS — D696 Thrombocytopenia, unspecified: Secondary | ICD-10-CM | POA: Diagnosis present

## 2016-11-18 DIAGNOSIS — M810 Age-related osteoporosis without current pathological fracture: Secondary | ICD-10-CM | POA: Diagnosis present

## 2016-11-18 DIAGNOSIS — R740 Nonspecific elevation of levels of transaminase and lactic acid dehydrogenase [LDH]: Secondary | ICD-10-CM | POA: Diagnosis present

## 2016-11-18 DIAGNOSIS — F10239 Alcohol dependence with withdrawal, unspecified: Secondary | ICD-10-CM | POA: Diagnosis present

## 2016-11-18 DIAGNOSIS — K859 Acute pancreatitis without necrosis or infection, unspecified: Secondary | ICD-10-CM

## 2016-11-18 DIAGNOSIS — R112 Nausea with vomiting, unspecified: Secondary | ICD-10-CM | POA: Diagnosis not present

## 2016-11-18 DIAGNOSIS — I10 Essential (primary) hypertension: Secondary | ICD-10-CM | POA: Diagnosis not present

## 2016-11-18 DIAGNOSIS — F419 Anxiety disorder, unspecified: Secondary | ICD-10-CM | POA: Diagnosis present

## 2016-11-18 DIAGNOSIS — Q211 Atrial septal defect: Secondary | ICD-10-CM | POA: Diagnosis not present

## 2016-11-18 DIAGNOSIS — H9190 Unspecified hearing loss, unspecified ear: Secondary | ICD-10-CM | POA: Diagnosis present

## 2016-11-18 DIAGNOSIS — E872 Acidosis: Secondary | ICD-10-CM

## 2016-11-18 DIAGNOSIS — R1310 Dysphagia, unspecified: Secondary | ICD-10-CM | POA: Diagnosis not present

## 2016-11-18 DIAGNOSIS — I429 Cardiomyopathy, unspecified: Secondary | ICD-10-CM | POA: Diagnosis present

## 2016-11-18 DIAGNOSIS — E785 Hyperlipidemia, unspecified: Secondary | ICD-10-CM | POA: Diagnosis present

## 2016-11-18 DIAGNOSIS — K7460 Unspecified cirrhosis of liver: Secondary | ICD-10-CM | POA: Diagnosis present

## 2016-11-18 DIAGNOSIS — E559 Vitamin D deficiency, unspecified: Secondary | ICD-10-CM | POA: Diagnosis present

## 2016-11-18 DIAGNOSIS — Z8674 Personal history of sudden cardiac arrest: Secondary | ICD-10-CM | POA: Diagnosis not present

## 2016-11-18 DIAGNOSIS — I2510 Atherosclerotic heart disease of native coronary artery without angina pectoris: Secondary | ICD-10-CM | POA: Diagnosis present

## 2016-11-18 DIAGNOSIS — R4702 Dysphasia: Secondary | ICD-10-CM | POA: Diagnosis present

## 2016-11-18 DIAGNOSIS — R69 Illness, unspecified: Secondary | ICD-10-CM | POA: Diagnosis not present

## 2016-11-18 DIAGNOSIS — R1013 Epigastric pain: Secondary | ICD-10-CM

## 2016-11-18 DIAGNOSIS — F329 Major depressive disorder, single episode, unspecified: Secondary | ICD-10-CM | POA: Diagnosis present

## 2016-11-18 LAB — CBC
HCT: 34.2 % — ABNORMAL LOW (ref 36.0–46.0)
HEMOGLOBIN: 11.2 g/dL — AB (ref 12.0–15.0)
MCH: 33.1 pg (ref 26.0–34.0)
MCHC: 32.7 g/dL (ref 30.0–36.0)
MCV: 101.2 fL — ABNORMAL HIGH (ref 78.0–100.0)
PLATELETS: 100 10*3/uL — AB (ref 150–400)
RBC: 3.38 MIL/uL — AB (ref 3.87–5.11)
RDW: 16 % — ABNORMAL HIGH (ref 11.5–15.5)
WBC: 2.1 10*3/uL — ABNORMAL LOW (ref 4.0–10.5)

## 2016-11-18 LAB — COMPREHENSIVE METABOLIC PANEL
ALK PHOS: 111 U/L (ref 38–126)
ALT: 18 U/L (ref 14–54)
ANION GAP: 9 (ref 5–15)
AST: 39 U/L (ref 15–41)
Albumin: 3.2 g/dL — ABNORMAL LOW (ref 3.5–5.0)
BILIRUBIN TOTAL: 1.5 mg/dL — AB (ref 0.3–1.2)
BUN: 7 mg/dL (ref 6–20)
CALCIUM: 8.1 mg/dL — AB (ref 8.9–10.3)
CO2: 22 mmol/L (ref 22–32)
CREATININE: 0.74 mg/dL (ref 0.44–1.00)
Chloride: 106 mmol/L (ref 101–111)
GFR calc non Af Amer: >60 mL/min (ref >60)
Glucose, Bld: 87 mg/dL (ref 65–99)
Potassium: 3.7 mmol/L (ref 3.5–5.1)
SODIUM: 137 mmol/L (ref 135–145)
TOTAL PROTEIN: 5.6 g/dL — AB (ref 6.5–8.1)

## 2016-11-18 LAB — GLUCOSE, CAPILLARY
GLUCOSE-CAPILLARY: 92 mg/dL (ref 65–99)
GLUCOSE-CAPILLARY: 91 mg/dL (ref 65–99)
Glucose-Capillary: 76 mg/dL (ref 65–99)

## 2016-11-18 LAB — MRSA PCR SCREENING: MRSA by PCR: POSITIVE — AB

## 2016-11-18 MED ORDER — GLYCERIN-HYPROMELLOSE-PEG 400 0.2-0.2-1 % OP SOLN
1.0000 [drp] | Freq: Four times a day (QID) | OPHTHALMIC | Status: DC | PRN
  Administered 2016-11-18 – 2016-11-19 (×3): 1 [drp] via OPHTHALMIC
  Filled 2016-11-18 (×3): qty 15

## 2016-11-18 MED ORDER — MUPIROCIN 2 % EX OINT
1.0000 "application " | TOPICAL_OINTMENT | Freq: Two times a day (BID) | CUTANEOUS | Status: DC
  Administered 2016-11-18 – 2016-11-20 (×6): 1 via NASAL
  Filled 2016-11-18 (×2): qty 22

## 2016-11-18 MED ORDER — BOOST / RESOURCE BREEZE PO LIQD
1.0000 | Freq: Three times a day (TID) | ORAL | Status: DC
Start: 2016-11-18 — End: 2016-11-19

## 2016-11-18 MED ORDER — CHLORHEXIDINE GLUCONATE CLOTH 2 % EX PADS
6.0000 | MEDICATED_PAD | Freq: Every day | CUTANEOUS | Status: DC
  Administered 2016-11-19 – 2016-11-20 (×2): 6 via TOPICAL

## 2016-11-18 MED FILL — Hypromellose Gonioscopic Soln 2.5%: OPHTHALMIC | Qty: 15 | Status: AC

## 2016-11-18 NOTE — Progress Notes (Signed)
[facility] PROGRESS NOTE  [name] JJO:[id number] DOB: October 14, 1960 DOA: 11/17/2016 PCP: [name], MD  Brief summary   56 y.o. female with PMH of Mitral valve repair (biopresthetic), HTN, Depression, Alcoholism, presented with abdominal pain, associated with shaking, nausea and vomiting. Vomiting is described as dry heaving, unable to tolerate food.  Patient states that she drinks a 10-16 ounce bottle of vodka every day. Last alcoholic beverage was approximately 48-60 hours prior to admission. She reports prior h/o DTs, seizures   Assessment/Plan:  Pancreatitis: mild w/ lipase of 66. CT questionable  mild inflammatory changes around the middle. Suspect majority of pts abd pain, n/v are from ETOH withdrawal.  -cont IVF, Toradol, Morhpine prn. Diet if tolerates today   Lactic acidosis: Anion gap 18. EtOH induced. Initial lactic acid 2.7 with downtrending to the normal range after aggressive IV hydration. Remainder of chemistries essentially normal with bicarbonate of 22. -cont IVF, recommended to  EtOH cessation  EtOH abuse: Patient drinks 10-16 ounces of vodka per day at baseline. Last alcoholic beverage approximately 60 hours prior to admission. Reports previous h/o DTs, seizures.  -cont monitor on CIWA protocol,  Patient to continue meeting with her substance abuse counselor at time of discharge.  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 Alkaline phosphatase 138, AST 65, ALT 24, total protein 6.8, total bilirubin 1.8. MELD 9.  - ETOH cessation Cytopenia likely due to alcoholism, liver early cirrhosis. recommenced to stop etoh use. Hold heparin due to thrombocytopenia. Monitor   Reflux esophagitis: Resulting in dysphasia. Patient scheduled to have an EGD performed on the day prior to admission but was unable to go due to symptoms. - Continue home PPI. Carafate. Follow-up GI outpatient  Infectious: Patient was started on vancomycin and Zosyn empirically by EDP due to overall  constellation of symptoms and concern for infectious process. Ongoing workup has been unremarkable including negative UA, negative CT abdomen, negative CXR. Patient febrile to 11 in the ED but felt that this is likely secondary to withdrawal and persistent tremors. Patient may have a viral gastroenteritis. Blood cultures have been drawn and sent: NGTD - Hold further antibiotics and follow up on cultures.  Depression: . On Vilazodone HTN:. continue metop   Code Status: full Family Communication: d/w patient, RN (indicate person spoken with, relationship, and if by phone, the number) Disposition Plan: home 24-48 hrs    Consultants:  none  Procedures:  none  Antibiotics:  none (indicate start date, and stop date if known)  HPI/Subjective: Alert, no vomiting in AM. Will try to advance diet   Objective: Vitals:   11/17/16 2059 11/18/16 0500  BP: 119/69 107/68  Pulse: 85 81  Resp:  20  Temp: 98.7 F (37.1 C) 98.7 F (37.1 C)    Intake/Output Summary (Last 24 hours) at 11/18/16 1205 Last data filed at 11/18/16 0900  Gross per 24 hour  Intake          1313.75 ml  Output                0 ml  Net          1313.75 ml   Filed Weights   11/17/16 0641 11/18/16 0500  Weight: 59 kg (130 lb) 59 kg (130 lb)    Exam:   General:  No distress   Cardiovascular: s1,s2 rrr  Respiratory: CTA BL   Abdomen: soft, mild epigastric tenderness, no rebound   Musculoskeletal: no leg edema    Data Reviewed: Basic Metabolic Panel:  Recent Labs Lab 11/17/16 0645 11/18/16 0541  NA 136 137  K 4.1 3.7  CL 96* 106  CO2 22 22  GLUCOSE 105* 87  BUN 11 7  CREATININE 0.87 0.74  CALCIUM 8.9 8.1*  MG 1.4*  --   PHOS 2.5  --    Liver Function Tests:  Recent Labs Lab 11/17/16 0645 11/18/16 0541  AST 65* 39  ALT 24 18  ALKPHOS 138* 111  BILITOT 1.8* 1.5*  PROT 6.8 5.6*  ALBUMIN 3.9 3.2*    Recent Labs Lab 11/17/16 0645  LIPASE 66*   No results for input(s): AMMONIA  in the last 168 hours. CBC:  Recent Labs Lab 11/17/16 0645 11/18/16 0758  WBC 4.0 2.1*  HGB 13.1 11.2*  HCT 38.5 34.2*  MCV 97.7 101.2*  PLT 160 100*   Cardiac Enzymes: No results for input(s): CKTOTAL, CKMB, CKMBINDEX, TROPONINI in the last 168 hours. BNP (last 3 results) No results for input(s): BNP in the last 8760 hours.  ProBNP (last 3 results) No results for input(s): PROBNP in the last 8760 hours.  CBG:  Recent Labs Lab 11/17/16 2112 11/18/16 0837 11/18/16 1149  GLUCAP 134* 92 91    Recent Results (from the past 240 hour(s))  MRSA PCR Screening     Status: Abnormal   Collection Time: 11/17/16  1:41 AM  Result Value Ref Range Status   MRSA by PCR POSITIVE (A) NEGATIVE Final    Comment:        The GeneXpert MRSA Assay (FDA approved for NASAL specimens only), is one component of a comprehensive MRSA colonization surveillance program. It is not intended to diagnose MRSA infection nor to guide or monitor treatment for MRSA infections. RESULT CALLED TO, READ BACK BY AND VERIFIED WITH: C.BOKIAGON,RN AT 0318 BY L.PITT 11/18/16   Blood culture (routine x 2)     Status: None (Preliminary result)   Collection Time: 11/17/16  8:54 AM  Result Value Ref Range Status   Specimen Description BLOOD RIGHT WRIST  Final   Special Requests   Final    BOTTLES DRAWN AEROBIC ONLY Blood Culture adequate volume   Culture NO GROWTH 1 DAY  Final   Report Status PENDING  Incomplete  Blood culture (routine x 2)     Status: None (Preliminary result)   Collection Time: 11/17/16  8:58 AM  Result Value Ref Range Status   Specimen Description BLOOD LEFT HAND  Final   Special Requests   Final    BOTTLES DRAWN AEROBIC ONLY Blood Culture adequate volume   Culture NO GROWTH 1 DAY  Final   Report Status PENDING  Incomplete     Studies: Dg Chest 2 View  Result Date: 11/17/2016 CLINICAL DATA:  Epigastric pain and nausea with chest pressure. EXAM: CHEST  2 VIEW COMPARISON:  07/17/2016  FINDINGS: Previous median sternotomy and valve replacement. Heart size is normal. Small hiatal hernia. Mediastinal shadows are otherwise normal. The lungs are clear. The vascularity is normal. No effusions. Focal eventration of the left posterior hemidiaphragm. Old thoracic compression deformities. IMPRESSION: Previous valve replacement.  No active cardiopulmonary disease. Electronically Signed   By: Nelson Chimes M.D.   On: 11/17/2016 07:34   Ct Abdomen Pelvis W Contrast  Result Date: 11/17/2016 CLINICAL DATA:  Abdominal pain with nausea and vomiting EXAM: CT ABDOMEN AND PELVIS WITH CONTRAST TECHNIQUE: Multidetector CT imaging of the abdomen and pelvis was performed using the standard protocol following bolus administration of intravenous contrast. CONTRAST:  148mL ISOVUE-300  IOPAMIDOL (ISOVUE-300) INJECTION 61% COMPARISON:  March 15, 2016 FINDINGS: Lower chest: Lung bases are clear. Prosthetic mitral valve evident. There is a focal hiatal hernia. Hepatobiliary: There is hepatic steatosis. Hepatic steatosis appears somewhat more prominent in the posterior liver and near the falciform ligament, unchanged from prior study. No evident focal liver lesion. Caudate lobe of the liver is somewhat prominent. Lateral segment of the left lobe of the liver is also prominent, stable from prior study. Gallbladder is absent. There is no appreciable biliary duct dilatation. Pancreas: There is no pancreatic mass for inflammatory focus. Spleen: No splenic lesions are evident. Adrenals/Urinary Tract: Adrenals appear unremarkable bilaterally. There is no evident renal mass or hydronephrosis on either side. There is no renal or ureteral calculus. Urinary bladder is midline with wall thickness within normal limits. Stomach/Bowel: There are scattered colonic diverticula without diverticulitis. There is no bowel wall or mesenteric thickening. No bowel obstruction. No free air or portal venous air. Vascular/Lymphatic: There are foci  of atherosclerotic calcification in the aorta. There is no abdominal aortic aneurysm. Major mesenteric vessels appear patent. A small amount of air is noted in the left common femoral vein. There is no appreciable adenopathy in the abdomen or pelvis. Reproductive: Uterus is anteverted.  No pelvic mass evident. Other: Appendix absent. No abscess or ascites evident in the abdomen or pelvis. There is a minimal ventral hernia containing only fat. Musculoskeletal: There is stable prior compression of the L1 vertebral body with retropulsion of bone along the posterosuperior aspect of L1, stable. There is degenerative change throughout the lumbar spine. No blastic or lytic bone lesions are evident. There is evidence of an old T12 rib fracture on the left posteriorly. There is no intramuscular or abdominal wall lesion. IMPRESSION: 1. No bowel wall thickening or bowel obstruction. No evident abscess. Appendix absent. 2.  No renal or ureteral calculus.  No hydronephrosis. 3. Focal hiatal hernia. Minimal ventral hernia containing only fat. 4. Persistent hepatic steatosis. Prominence of the lateral segment of the left lobe of the liver and caudate lobe, raising question of a degree of hepatic cirrhosis. No focal liver lesions evident. 5. Small amount of air in the left common femoral vein. Question recent cannulation of this vessel. 6.  Aortic atherosclerosis.  No aneurysm. 7. Old trauma at L1 with a degree of retropulsion of bone into the canal at this level, stable. No high-grade stenosis. Old left T12 rib fracture. 8. Gallbladder and appendix absent. Status post mitral valve repair. Aortic Atherosclerosis (ICD10-I70.0). Electronically Signed   By: Lowella Grip III M.D.   On: 11/17/2016 09:40    Scheduled Meds: . aspirin  81 mg Oral Daily  . atorvastatin  40 mg Oral q1800  . Chlorhexidine Gluconate Cloth  6 each Topical Q0600  . fluticasone  2 spray Each Nare Daily  . folic acid  1 mg Intravenous Daily  .  heparin  5,000 Units Subcutaneous Q8H  . metoprolol tartrate  25 mg Oral BID  . multivitamin  15 mL Oral Daily  . mupirocin ointment  1 application Nasal BID  . pantoprazole (PROTONIX) IV  40 mg Intravenous Q12H  . prazosin  2 mg Oral QHS  . sucralfate  1 g Oral TID WC & HS  . thiamine  100 mg Intravenous Daily  . Vilazodone HCl  40 mg Oral Daily   Continuous Infusions: . sodium chloride 75 mL/hr at 11/18/16 0716    Active Problems:   Abdominal pain   Nausea & vomiting  Esophageal dysphagia   Transaminitis   Increased anion gap metabolic acidosis   Essential hypertension   Lactic acidosis   Alcohol abuse   Pancreatitis    Time spent: >35 minutes     [name]  [facility] Pager [phone]. If 7PM-7AM, please contact night-coverage at [email or web address], password [facility] 11/18/2016, 12:05 PM  LOS: 0 days

## 2016-11-18 NOTE — Plan of Care (Signed)
Problem: Pain Managment: Goal: General experience of comfort will improve Outcome: Progressing Pt reporting pain on sacrum. Foam dressing placed. No breakdown noted. Also reports some abdominal pain. Reports some relief with morphine.

## 2016-11-18 NOTE — Progress Notes (Signed)
Initial Nutrition Assessment  DOCUMENTATION CODES:   Non-severe (moderate) malnutrition in context of chronic illness  INTERVENTION:   -Boost Breeze po TID, each supplement provides 250 kcal and 9 grams of protein, while on CL diet  -Order Ensure Enlive po BID, each supplement provides 350 kcal and 20 grams of protein, once diet advanced past CL  -Pt may benefit from smaller, more frequent meals  -Once advanced to solid food diet, recommend Dysphagia III (easy to chew) due to pt being edentulous  -Continue MVI  NUTRITION DIAGNOSIS:   Malnutrition (Moderate) related to chronic illness (chronic dysphagia, EtOH abuse, CM) as evidenced by mild depletion of body fat, mild depletion of muscle mass.  GOAL:   Patient will meet greater than or equal to 90% of their needs  MONITOR:   PO intake, Supplement acceptance, Labs, Weight trends  REASON FOR ASSESSMENT:   Malnutrition Screening Tool    ASSESSMENT:   56 yo female admitted with N/V/abdominal pain with pancreatitis with EtOH abuse, early cirrhosis, dysphagia secondary to reflux esophagitis (noted pt was scheduled for EGD the day prior to admission but unable to go due to symptoms). Pt with hx of CAD, CM, depression, dysphagia, GERD, gastritis, anxiety/depression  Pt hungry on visit today, wanting something to eat. Diet just advanced to CL. Pt reports appetite is up and down at home. 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 Pt appears to mostly be eating fluids and very soft foods like mashed potatoes and other mashed vegetables, soups, yogurt, etc. Pt does reports she drinks Ensure.   Pt is edentulous; reports she has dentures but reports she has not worn them yet has her gums have not healed from having her teeth pulled. Pt needs softer foods secondary to this as well  Pt reports N/V and abdominal pain have improved.   Noted per chart  review, pt states she drinks 10-16 ounce bottle of vodka every day. Noted pt on CIWA  Weight stable per weight encounters since 05/2016. Per patient, she lost a lot of weight when she "died last year."  Nutrition-Focused physical exam completed. Findings are mild fat depletion, mild muscle depletion, and no edema.   Labs: reviewed Meds: NS at 75 ml/hr, folic acid, MVI, thiamine  Diet Order:  Diet clear liquid Room service appropriate? Yes; Fluid consistency: Thin  Skin:  Reviewed, no issues  Last BM:  no documented BM  Height:   Ht Readings from Last 1 Encounters:  11/17/16 5\' 3"  (1.6 m)    Weight:   Wt Readings from Last 1 Encounters:  11/18/16 130 lb (59 kg)   BMI:  Body mass index is 23.03 kg/m.  Estimated Nutritional Needs:   Kcal:  1650-1900 kcals  Protein:  83-94 g  Fluid:  >/= 1.8 L  EDUCATION NEEDS:   No education needs identified at this time  [place], [place], LDN [phone] Pager  [phone] Weekend/On-Call Pager

## 2016-11-18 NOTE — Care Management Obs Status (Signed)
[place] [facility] NOTIFICATION   Patient Details  Name: [name] MRN: [id number] Date of Birth: [birth year]   Medicare Observation Status Notification Given:  Yes    Keashia Haskins, Rory Percy, RN 11/18/2016, 12:56 PM

## 2016-11-18 NOTE — Telephone Encounter (Signed)
Due to her insurance Medicare we are unable to Billed No Show Fee. Pt was admitted to the hospital the very next day because she was not feeling well on the day of the procedure.She is still at the hospital today.

## 2016-11-19 LAB — GLUCOSE, CAPILLARY
GLUCOSE-CAPILLARY: 106 mg/dL — AB (ref 65–99)
Glucose-Capillary: 85 mg/dL (ref 65–99)

## 2016-11-19 MED ORDER — ENSURE ENLIVE PO LIQD
237.0000 mL | Freq: Two times a day (BID) | ORAL | Status: DC
  Administered 2016-11-19: 237 mL via ORAL

## 2016-11-19 NOTE — Progress Notes (Signed)
[facility] PROGRESS NOTE  [name] CXK:[id number] DOB: Dec 17, 1960 DOA: 11/17/2016 PCP: [name], MD  Brief summary   56 y.o. female with PMH of Mitral valve repair (biopresthetic), HTN, Depression, Alcoholism, presented with abdominal pain, associated with shaking, nausea and vomiting. Vomiting is described as dry heaving, unable to tolerate food.  Patient states that she drinks a 10-16 ounce bottle of vodka every day. Last alcoholic beverage was approximately 48-60 hours prior to admission. She reports prior h/o DTs, seizures   Assessment/Plan:  Pancreatitis: mild w/ lipase of 66. CT questionable  mild inflammatory changes around the middle. Suspect majority of pts abd pain, n/v are from ETOH withdrawal.  -improving with cont IVF, Toradol, Morhpine prn. Advance Diet as tolerates  Lactic acidosis: Anion gap 18. EtOH induced. Initial lactic acid 2.7 with downtrending to the normal range after aggressive IV hydration. Remainder of chemistries essentially normal with bicarbonate of 22. -improved with cont IVF, recommended to  EtOH cessation  EtOH abuse: Patient drinks 10-16 ounces of vodka per day at baseline. Last alcoholic beverage approximately 60 hours prior to admission. Reports previous h/o DTs, seizures.  -improving with cont monitor on CIWA protocol,  Patient to continue meeting with her substance abuse counselor at time of discharge.  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 Alkaline phosphatase 138, AST 65, ALT 24, total protein 6.8, total bilirubin 1.8. MELD 9.  - ETOH cessation Cytopenia likely due to alcoholism, liver early cirrhosis. recommenced to stop etoh use. Hold heparin due to thrombocytopenia. Monitor   Reflux esophagitis: Resulting in dysphasia. Patient scheduled to have an EGD performed on the day prior to admission but was unable to go due to symptoms. - Continue home PPI. Carafate. Follow-up GI outpatient  Infectious: Patient was started on vancomycin and  Zosyn empirically by EDP due to overall constellation of symptoms and concern for infectious process. Ongoing workup has been unremarkable including negative UA, negative CT abdomen, negative CXR. Patient febrile to 11 in the ED but felt that this is likely secondary to withdrawal and persistent tremors. Patient may have a viral gastroenteritis. Blood cultures have been drawn and sent: NGTD - Hold further antibiotics and follow up on cultures.  Depression: . On Vilazodone HTN:. continue metop   Code Status: full Family Communication: d/w patient, RN (indicate person spoken with, relationship, and if by phone, the number) Disposition Plan: home likely tomorrow if stable     Consultants:  none  Procedures:  none  Antibiotics:  none (indicate start date, and stop date if known)  HPI/Subjective: Alert, no vomiting in AM. Will try to advance diet   Objective: Vitals:   11/18/16 1900 11/19/16 0606  BP: 129/83 (!) 143/92  Pulse: 93 (!) 103  Resp: 19 19  Temp: 98.2 F (36.8 C) 98.2 F (36.8 C)    Intake/Output Summary (Last 24 hours) at 11/19/16 0857 Last data filed at 11/19/16 0606  Gross per 24 hour  Intake             2280 ml  Output                0 ml  Net             2280 ml   Filed Weights   11/17/16 0641 11/18/16 0500 11/18/16 1900  Weight: 59 kg (130 lb) 59 kg (130 lb) 59.5 kg (131 lb 1.6 oz)    Exam:   General:  No distress   Cardiovascular: s1,s2 rrr  Respiratory: CTA  BL   Abdomen: soft, mild epigastric tenderness, no rebound   Musculoskeletal: no leg edema    Data Reviewed: Basic Metabolic Panel:  Recent Labs Lab 11/17/16 0645 11/18/16 0541  NA 136 137  K 4.1 3.7  CL 96* 106  CO2 22 22  GLUCOSE 105* 87  BUN 11 7  CREATININE 0.87 0.74  CALCIUM 8.9 8.1*  MG 1.4*  --   PHOS 2.5  --    Liver Function Tests:  Recent Labs Lab 11/17/16 0645 11/18/16 0541  AST 65* 39  ALT 24 18  ALKPHOS 138* 111  BILITOT 1.8* 1.5*  PROT 6.8 5.6*   ALBUMIN 3.9 3.2*    Recent Labs Lab 11/17/16 0645  LIPASE 66*   No results for input(s): AMMONIA in the last 168 hours. CBC:  Recent Labs Lab 11/17/16 0645 11/18/16 0758  WBC 4.0 2.1*  HGB 13.1 11.2*  HCT 38.5 34.2*  MCV 97.7 101.2*  PLT 160 100*   Cardiac Enzymes: No results for input(s): CKTOTAL, CKMB, CKMBINDEX, TROPONINI in the last 168 hours. BNP (last 3 results) No results for input(s): BNP in the last 8760 hours.  ProBNP (last 3 results) No results for input(s): PROBNP in the last 8760 hours.  CBG:  Recent Labs Lab 11/17/16 2112 11/18/16 0837 11/18/16 1149 11/18/16 1632 11/19/16 0801  GLUCAP 134* 92 91 76 85    Recent Results (from the past 240 hour(s))  MRSA PCR Screening     Status: Abnormal   Collection Time: 11/17/16  1:41 AM  Result Value Ref Range Status   MRSA by PCR POSITIVE (A) NEGATIVE Final    Comment:        The GeneXpert MRSA Assay (FDA approved for NASAL specimens only), is one component of a comprehensive MRSA colonization surveillance program. It is not intended to diagnose MRSA infection nor to guide or monitor treatment for MRSA infections. RESULT CALLED TO, READ BACK BY AND VERIFIED WITH: C.BOKIAGON,RN AT 0318 BY L.PITT 11/18/16   Blood culture (routine x 2)     Status: None (Preliminary result)   Collection Time: 11/17/16  8:54 AM  Result Value Ref Range Status   Specimen Description BLOOD RIGHT WRIST  Final   Special Requests   Final    BOTTLES DRAWN AEROBIC ONLY Blood Culture adequate volume   Culture NO GROWTH 1 DAY  Final   Report Status PENDING  Incomplete  Blood culture (routine x 2)     Status: None (Preliminary result)   Collection Time: 11/17/16  8:58 AM  Result Value Ref Range Status   Specimen Description BLOOD LEFT HAND  Final   Special Requests   Final    BOTTLES DRAWN AEROBIC ONLY Blood Culture adequate volume   Culture NO GROWTH 1 DAY  Final   Report Status PENDING  Incomplete     Studies: Ct  Abdomen Pelvis W Contrast  Result Date: 11/17/2016 CLINICAL DATA:  Abdominal pain with nausea and vomiting EXAM: CT ABDOMEN AND PELVIS WITH CONTRAST TECHNIQUE: Multidetector CT imaging of the abdomen and pelvis was performed using the standard protocol following bolus administration of intravenous contrast. CONTRAST:  146mL ISOVUE-300 IOPAMIDOL (ISOVUE-300) INJECTION 61% COMPARISON:  March 15, 2016 FINDINGS: Lower chest: Lung bases are clear. Prosthetic mitral valve evident. There is a focal hiatal hernia. Hepatobiliary: There is hepatic steatosis. Hepatic steatosis appears somewhat more prominent in the posterior liver and near the falciform ligament, unchanged from prior study. No evident focal liver lesion. Caudate lobe of the liver  is somewhat prominent. Lateral segment of the left lobe of the liver is also prominent, stable from prior study. Gallbladder is absent. There is no appreciable biliary duct dilatation. Pancreas: There is no pancreatic mass for inflammatory focus. Spleen: No splenic lesions are evident. Adrenals/Urinary Tract: Adrenals appear unremarkable bilaterally. There is no evident renal mass or hydronephrosis on either side. There is no renal or ureteral calculus. Urinary bladder is midline with wall thickness within normal limits. Stomach/Bowel: There are scattered colonic diverticula without diverticulitis. There is no bowel wall or mesenteric thickening. No bowel obstruction. No free air or portal venous air. Vascular/Lymphatic: There are foci of atherosclerotic calcification in the aorta. There is no abdominal aortic aneurysm. Major mesenteric vessels appear patent. A small amount of air is noted in the left common femoral vein. There is no appreciable adenopathy in the abdomen or pelvis. Reproductive: Uterus is anteverted.  No pelvic mass evident. Other: Appendix absent. No abscess or ascites evident in the abdomen or pelvis. There is a minimal ventral hernia containing only fat.  Musculoskeletal: There is stable prior compression of the L1 vertebral body with retropulsion of bone along the posterosuperior aspect of L1, stable. There is degenerative change throughout the lumbar spine. No blastic or lytic bone lesions are evident. There is evidence of an old T12 rib fracture on the left posteriorly. There is no intramuscular or abdominal wall lesion. IMPRESSION: 1. No bowel wall thickening or bowel obstruction. No evident abscess. Appendix absent. 2.  No renal or ureteral calculus.  No hydronephrosis. 3. Focal hiatal hernia. Minimal ventral hernia containing only fat. 4. Persistent hepatic steatosis. Prominence of the lateral segment of the left lobe of the liver and caudate lobe, raising question of a degree of hepatic cirrhosis. No focal liver lesions evident. 5. Small amount of air in the left common femoral vein. Question recent cannulation of this vessel. 6.  Aortic atherosclerosis.  No aneurysm. 7. Old trauma at L1 with a degree of retropulsion of bone into the canal at this level, stable. No high-grade stenosis. Old left T12 rib fracture. 8. Gallbladder and appendix absent. Status post mitral valve repair. Aortic Atherosclerosis (ICD10-I70.0). Electronically Signed   By: Lowella Grip III M.D.   On: 11/17/2016 09:40    Scheduled Meds: . aspirin  81 mg Oral Daily  . atorvastatin  40 mg Oral q1800  . Chlorhexidine Gluconate Cloth  6 each Topical Q0600  . feeding supplement (ENSURE ENLIVE)  237 mL Oral BID BM  . fluticasone  2 spray Each Nare Daily  . folic acid  1 mg Intravenous Daily  . metoprolol tartrate  25 mg Oral BID  . multivitamin  15 mL Oral Daily  . mupirocin ointment  1 application Nasal BID  . pantoprazole (PROTONIX) IV  40 mg Intravenous Q12H  . prazosin  2 mg Oral QHS  . sucralfate  1 g Oral TID WC & HS  . thiamine  100 mg Intravenous Daily  . Vilazodone HCl  40 mg Oral Daily   Continuous Infusions: . sodium chloride 75 mL/hr at 11/18/16 2104     Active Problems:   Abdominal pain   Nausea & vomiting   Esophageal dysphagia   Transaminitis   Increased anion gap metabolic acidosis   Essential hypertension   Lactic acidosis   Alcohol abuse   Pancreatitis    Time spent: >35 minutes     [name]  [facility] Pager [phone]. If 7PM-7AM, please contact night-coverage at [email or web address], password [facility]  11/19/2016, 8:57 AM  LOS: 1 day

## 2016-11-20 DIAGNOSIS — R1310 Dysphagia, unspecified: Secondary | ICD-10-CM

## 2016-11-20 MED ORDER — ACETAMINOPHEN 325 MG PO TABS: 650.0000 mg | ORAL_TABLET | Freq: Four times a day (QID) | ORAL | Status: AC | PRN

## 2016-11-20 NOTE — Discharge Summary (Signed)
Physician Discharge Summary  [name] PNT:[id number] DOB: 12/04/1960 DOA: 11/17/2016  PCP: [name], MD  Admit date: 11/17/2016 Discharge date: 11/20/2016  Time spent: >35 minutes  Recommendations for Outpatient Follow-up:  PCP in 3-5 days as needed  Discharge Diagnoses:  Active Problems:   Abdominal pain   Nausea & vomiting   Esophageal dysphagia   Transaminitis   Increased anion gap metabolic acidosis   Essential hypertension   Lactic acidosis   Alcohol abuse   Pancreatitis   Discharge Condition: stable   Diet recommendation: low sodium   Filed Weights   11/17/16 0641 11/18/16 0500 11/18/16 1900  Weight: 59 kg (130 lb) 59 kg (130 lb) 59.5 kg (131 lb 1.6 oz)    History of present illness:  56 y.o.femalewith PMH of Mitral valve repair (biopresthetic), HTN, Depression, Alcoholism, presented with abdominal pain, associated with shaking, nausea and vomiting. Vomiting is described as dry heaving, unable to tolerate food.  Patient states that she drinks a 10-16 ounce bottle of vodka every day. Last alcoholic beverage was approximately 48-60 hours prior to admission. She reports prior h/o DTs, seizures   Hospital Course:   Episode of alcoholic Pancreatitis: mild w/ lipase of 66. CT questionable  mild inflammatory changes around the middle. Suspect majority of pts abd pain, n/v are from ETOH withdrawal.  -resolved with supportive care, IVF, Toradol, Morhpine prn. Advanced Diet, tolerated well. Recommended to stop etoh use   Lactic acidosis: Anion gap 18. EtOH induced. Initial lactic acid 2.7 with downtrending to the normal range after aggressive IV hydration. Remainder of chemistries essentially normal with bicarbonate of 22. -resolved with cont IVF, recommended to  EtOH cessation  EtOH abuse: withdrawals on admission. Patient drinks 10-16 ounces of vodka per day at baseline. Last alcoholic beverage approximately 60 hours prior to admission. Reports previous h/o DTs,  seizures.  -resolved with CIWAprotocol,  Patient to continue meeting with her substance abuse counselor at time of discharge.  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 Alkaline phosphatase 138, AST 65, ALT 24, total protein 6.8, total bilirubin 1.8. MELD 9.  - ETOH cessation Cytopenia likely due to alcoholism, liver early cirrhosis. recommenced to stop etoh use.   Reflux esophagitis: Resulting in dysphasia. Patient scheduled to have an EGD performed on the day prior to admission but was unable to go due to symptoms. - Continue home PPI. Follow-up GI outpatient  Infectious: Patient was started on vancomycin and Zosyn empirically by EDP due to overall constellation of symptoms and concern for infectious process. Ongoing workup has been unremarkable including negative UA, negative CT abdomen, negative CXR. Patient febrile to 11 in the ED but felt that this is likely secondary to withdrawal and persistent tremors. Patient may have a viral gastroenteritis. Blood cultures have been drawn and sent: NGTD -fever resolved  Depression: . On Vilazodone HTN:. continue metop  Procedures:  none (i.e. Studies not automatically included, echos, thoracentesis, etc; not x-rays)  Consultations:  none  Discharge Exam: Vitals:   11/19/16 2138 11/20/16 0558  BP: (!) 128/91 126/85  Pulse: 94 96  Resp: 16 14  Temp: 99.3 F (37.4 C) 98.8 F (37.1 C)    General: alert, no distress  Cardiovascular: s1,s2 rrr Respiratory: CTA BL  Discharge Instructions  Discharge Instructions    Diet - low sodium heart healthy    Complete by:  As directed    Increase activity slowly    Complete by:  As directed      Allergies as of 11/20/2016  Reactions   Depakote [divalproex Sodium] Anaphylaxis   Diazepam Other (See Comments)   Died on operating table. TOLERATES ATIVAN AND LIBRIUM W/O DIFFICULTY   Valium Anaphylaxis   Tolerates Ativan and Librium w/o complication   Tizanidine Hcl Other (See Comments)    "passed out"      Medication List    STOP taking these medications   blood glucose meter kit and supplies Kit   HYDROcodone-acetaminophen 5-325 MG tablet Commonly known as:  NORCO/VICODIN     TAKE these medications   acetaminophen 325 MG tablet Commonly known as:  TYLENOL Take 2 tablets (650 mg total) by mouth every 6 (six) hours as needed for mild pain (or Fever >/= 101).   ALLEGRA ALLERGY 180 MG tablet Generic drug:  fexofenadine Take 180 mg by mouth daily.   aspirin 81 MG chewable tablet Chew 81 mg by mouth daily.   atorvastatin 40 MG tablet Commonly known as:  LIPITOR Take 1 tablet (40 mg total) by mouth daily at 6 PM.   Fish Oil 1000 MG Caps Take 1,000 mg by mouth 2 (two) times daily.   fluticasone 50 MCG/ACT nasal spray Commonly known as:  FLONASE Place 2 sprays into both nostrils daily.   HIGH-PROTEIN NUTRITIONAL SHAKE Liqd Drink between meals three times a day. Ok to use brand and flavor of pt's choice. No ingredient restrictions.   metoprolol tartrate 25 MG tablet Commonly known as:  LOPRESSOR Take 1 tablet (25 mg total) by mouth 2 (two) times daily.   nitroGLYCERIN 0.4 MG SL tablet Commonly known as:  NITROSTAT Place 1 tablet (0.4 mg total) under the tongue every 5 (five) minutes as needed for chest pain.   ondansetron 4 MG disintegrating tablet Commonly known as:  ZOFRAN ODT Take 1 tablet (4 mg total) by mouth every 8 (eight) hours as needed for nausea.   pantoprazole 20 MG tablet Commonly known as:  PROTONIX Take 1 tablet (20 mg total) by mouth daily.   prazosin 2 MG capsule Commonly known as:  MINIPRESS Take 2 mg by mouth at bedtime.   PRESCRIPTION MEDICATION Inhale into the lungs at bedtime. CPAP   VIIBRYD 40 MG Tabs Generic drug:  Vilazodone HCl Take 40 mg by mouth daily.   vitamin B-12 500 MCG tablet Commonly known as:  CYANOCOBALAMIN Take 500 mcg by mouth daily.   Vitamin D3 2000 units Tabs Take 2,000 Units by mouth daily.       Allergies  Allergen Reactions  . Depakote [Divalproex Sodium] Anaphylaxis  . Diazepam Other (See Comments)    Died on operating table. TOLERATES ATIVAN AND LIBRIUM W/O DIFFICULTY  . Valium Anaphylaxis    Tolerates Ativan and Librium w/o complication  . Tizanidine Hcl Other (See Comments)    "passed out"      The results of significant diagnostics from this hospitalization (including imaging, microbiology, ancillary and laboratory) are listed below for reference.    Significant Diagnostic Studies: Dg Chest 2 View  Result Date: 11/17/2016 CLINICAL DATA:  Epigastric pain and nausea with chest pressure. EXAM: CHEST  2 VIEW COMPARISON:  07/17/2016 FINDINGS: Previous median sternotomy and valve replacement. Heart size is normal. Small hiatal hernia. Mediastinal shadows are otherwise normal. The lungs are clear. The vascularity is normal. No effusions. Focal eventration of the left posterior hemidiaphragm. Old thoracic compression deformities. IMPRESSION: Previous valve replacement.  No active cardiopulmonary disease. Electronically Signed   By: Nelson Chimes M.D.   On: 11/17/2016 07:34   Ct Abdomen Pelvis W  Contrast  Result Date: 11/17/2016 CLINICAL DATA:  Abdominal pain with nausea and vomiting EXAM: CT ABDOMEN AND PELVIS WITH CONTRAST TECHNIQUE: Multidetector CT imaging of the abdomen and pelvis was performed using the standard protocol following bolus administration of intravenous contrast. CONTRAST:  124m ISOVUE-300 IOPAMIDOL (ISOVUE-300) INJECTION 61% COMPARISON:  March 15, 2016 FINDINGS: Lower chest: Lung bases are clear. Prosthetic mitral valve evident. There is a focal hiatal hernia. Hepatobiliary: There is hepatic steatosis. Hepatic steatosis appears somewhat more prominent in the posterior liver and near the falciform ligament, unchanged from prior study. No evident focal liver lesion. Caudate lobe of the liver is somewhat prominent. Lateral segment of the left lobe of the liver is  also prominent, stable from prior study. Gallbladder is absent. There is no appreciable biliary duct dilatation. Pancreas: There is no pancreatic mass for inflammatory focus. Spleen: No splenic lesions are evident. Adrenals/Urinary Tract: Adrenals appear unremarkable bilaterally. There is no evident renal mass or hydronephrosis on either side. There is no renal or ureteral calculus. Urinary bladder is midline with wall thickness within normal limits. Stomach/Bowel: There are scattered colonic diverticula without diverticulitis. There is no bowel wall or mesenteric thickening. No bowel obstruction. No free air or portal venous air. Vascular/Lymphatic: There are foci of atherosclerotic calcification in the aorta. There is no abdominal aortic aneurysm. Major mesenteric vessels appear patent. A small amount of air is noted in the left common femoral vein. There is no appreciable adenopathy in the abdomen or pelvis. Reproductive: Uterus is anteverted.  No pelvic mass evident. Other: Appendix absent. No abscess or ascites evident in the abdomen or pelvis. There is a minimal ventral hernia containing only fat. Musculoskeletal: There is stable prior compression of the L1 vertebral body with retropulsion of bone along the posterosuperior aspect of L1, stable. There is degenerative change throughout the lumbar spine. No blastic or lytic bone lesions are evident. There is evidence of an old T12 rib fracture on the left posteriorly. There is no intramuscular or abdominal wall lesion. IMPRESSION: 1. No bowel wall thickening or bowel obstruction. No evident abscess. Appendix absent. 2.  No renal or ureteral calculus.  No hydronephrosis. 3. Focal hiatal hernia. Minimal ventral hernia containing only fat. 4. Persistent hepatic steatosis. Prominence of the lateral segment of the left lobe of the liver and caudate lobe, raising question of a degree of hepatic cirrhosis. No focal liver lesions evident. 5. Small amount of air in the  left common femoral vein. Question recent cannulation of this vessel. 6.  Aortic atherosclerosis.  No aneurysm. 7. Old trauma at L1 with a degree of retropulsion of bone into the canal at this level, stable. No high-grade stenosis. Old left T12 rib fracture. 8. Gallbladder and appendix absent. Status post mitral valve repair. Aortic Atherosclerosis (ICD10-I70.0). Electronically Signed   By: WLowella GripIII M.D.   On: 11/17/2016 09:40   Dg Shoulder Left  Result Date: 10/31/2016 CLINICAL DATA:  Two falls over the past week.  Left shoulder pain. EXAM: LEFT SHOULDER - 2+ VIEW COMPARISON:  Chest x-ray 04/13/2016 FINDINGS: Exam demonstrates a nondisplaced distal left clavicle fracture. Glenohumeral joint is within normal. IMPRESSION: Minimally displaced distal left clavicle fracture. Electronically Signed   By: DMarin OlpM.D.   On: 10/31/2016 14:06   Dg Humerus Left  Result Date: 10/31/2016 CLINICAL DATA:  Two falls over the past week. EXAM: LEFT HUMERUS - 2+ VIEW COMPARISON:  Chest x-ray 04/13/2016 FINDINGS: Examination demonstrates a displaced distal left clavicle fracture. Glenohumeral joint is  within normal. Left humerus is within normal. IMPRESSION: Minimally displaced distal left clavicle fracture. Electronically Signed   By: Marin Olp M.D.   On: 10/31/2016 14:06    Microbiology: Recent Results (from the past 240 hour(s))  MRSA PCR Screening     Status: Abnormal   Collection Time: 11/17/16  1:41 AM  Result Value Ref Range Status   MRSA by PCR POSITIVE (A) NEGATIVE Final    Comment:        The GeneXpert MRSA Assay (FDA approved for NASAL specimens only), is one component of a comprehensive MRSA colonization surveillance program. It is not intended to diagnose MRSA infection nor to guide or monitor treatment for MRSA infections. RESULT CALLED TO, READ BACK BY AND VERIFIED WITH: C.BOKIAGON,RN AT 0318 BY L.PITT 11/18/16   Blood culture (routine x 2)     Status: None  (Preliminary result)   Collection Time: 11/17/16  8:54 AM  Result Value Ref Range Status   Specimen Description BLOOD RIGHT WRIST  Final   Special Requests   Final    BOTTLES DRAWN AEROBIC ONLY Blood Culture adequate volume   Culture NO GROWTH 2 DAYS  Final   Report Status PENDING  Incomplete  Blood culture (routine x 2)     Status: None (Preliminary result)   Collection Time: 11/17/16  8:58 AM  Result Value Ref Range Status   Specimen Description BLOOD LEFT HAND  Final   Special Requests   Final    BOTTLES DRAWN AEROBIC ONLY Blood Culture adequate volume   Culture NO GROWTH 2 DAYS  Final   Report Status PENDING  Incomplete     Labs: Basic Metabolic Panel:  Recent Labs Lab 11/17/16 0645 11/18/16 0541  NA 136 137  K 4.1 3.7  CL 96* 106  CO2 22 22  GLUCOSE 105* 87  BUN 11 7  CREATININE 0.87 0.74  CALCIUM 8.9 8.1*  MG 1.4*  --   PHOS 2.5  --    Liver Function Tests:  Recent Labs Lab 11/17/16 0645 11/18/16 0541  AST 65* 39  ALT 24 18  ALKPHOS 138* 111  BILITOT 1.8* 1.5*  PROT 6.8 5.6*  ALBUMIN 3.9 3.2*    Recent Labs Lab 11/17/16 0645  LIPASE 66*   No results for input(s): AMMONIA in the last 168 hours. CBC:  Recent Labs Lab 11/17/16 0645 11/18/16 0758  WBC 4.0 2.1*  HGB 13.1 11.2*  HCT 38.5 34.2*  MCV 97.7 101.2*  PLT 160 100*   Cardiac Enzymes: No results for input(s): CKTOTAL, CKMB, CKMBINDEX, TROPONINI in the last 168 hours. BNP: BNP (last 3 results) No results for input(s): BNP in the last 8760 hours.  ProBNP (last 3 results) No results for input(s): PROBNP in the last 8760 hours.  CBG:  Recent Labs Lab 11/18/16 0837 11/18/16 1149 11/18/16 1632 11/19/16 0801 11/19/16 1246  GLUCAP 92 91 76 85 106*       Signed:  Titiana Severa N  [facility] 11/20/2016, 9:56 AM

## 2016-11-22 LAB — CULTURE, BLOOD (ROUTINE X 2)
Culture: NO GROWTH
Culture: NO GROWTH
SPECIAL REQUESTS: ADEQUATE
SPECIAL REQUESTS: ADEQUATE

## 2016-11-25 DIAGNOSIS — R69 Illness, unspecified: Secondary | ICD-10-CM | POA: Diagnosis not present

## 2016-11-29 ENCOUNTER — Emergency Department (HOSPITAL_COMMUNITY)
Admission: EM | Admit: 2016-11-29 | Discharge: 2016-11-29 | Disposition: A | Discharge status: Home / Self Care | Payer: Medicare HMO | Attending: Emergency Medicine | Admitting: Emergency Medicine

## 2016-11-29 ENCOUNTER — Emergency Department (HOSPITAL_COMMUNITY): Payer: Medicare HMO

## 2016-11-29 ENCOUNTER — Encounter (HOSPITAL_COMMUNITY): Payer: Self-pay | Admitting: Emergency Medicine

## 2016-11-29 DIAGNOSIS — F1010 Alcohol abuse, uncomplicated: Secondary | ICD-10-CM | POA: Diagnosis not present

## 2016-11-29 DIAGNOSIS — I10 Essential (primary) hypertension: Secondary | ICD-10-CM | POA: Insufficient documentation

## 2016-11-29 DIAGNOSIS — R1013 Epigastric pain: Secondary | ICD-10-CM | POA: Insufficient documentation

## 2016-11-29 DIAGNOSIS — R109 Unspecified abdominal pain: Secondary | ICD-10-CM | POA: Diagnosis not present

## 2016-11-29 DIAGNOSIS — I2510 Atherosclerotic heart disease of native coronary artery without angina pectoris: Secondary | ICD-10-CM | POA: Insufficient documentation

## 2016-11-29 DIAGNOSIS — Z79899 Other long term (current) drug therapy: Secondary | ICD-10-CM | POA: Diagnosis not present

## 2016-11-29 DIAGNOSIS — R197 Diarrhea, unspecified: Secondary | ICD-10-CM | POA: Diagnosis not present

## 2016-11-29 DIAGNOSIS — F329 Major depressive disorder, single episode, unspecified: Secondary | ICD-10-CM | POA: Diagnosis not present

## 2016-11-29 DIAGNOSIS — R1110 Vomiting, unspecified: Secondary | ICD-10-CM | POA: Diagnosis not present

## 2016-11-29 DIAGNOSIS — R69 Illness, unspecified: Secondary | ICD-10-CM | POA: Diagnosis not present

## 2016-11-29 DIAGNOSIS — R112 Nausea with vomiting, unspecified: Secondary | ICD-10-CM | POA: Diagnosis not present

## 2016-11-29 DIAGNOSIS — Z7982 Long term (current) use of aspirin: Secondary | ICD-10-CM | POA: Insufficient documentation

## 2016-11-29 DIAGNOSIS — F10929 Alcohol use, unspecified with intoxication, unspecified: Secondary | ICD-10-CM | POA: Diagnosis not present

## 2016-11-29 LAB — URINALYSIS, ROUTINE W REFLEX MICROSCOPIC
Bacteria, UA: NONE SEEN
Bilirubin Urine: NEGATIVE
GLUCOSE, UA: NEGATIVE mg/dL
Hgb urine dipstick: NEGATIVE
Ketones, ur: 80 mg/dL — AB
Leukocytes, UA: NEGATIVE
NITRITE: NEGATIVE
PH: 6 (ref 5.0–8.0)
Protein, ur: NEGATIVE mg/dL
SPECIFIC GRAVITY, URINE: 1.018 (ref 1.005–1.030)

## 2016-11-29 LAB — COMPREHENSIVE METABOLIC PANEL
ALBUMIN: 4 g/dL (ref 3.5–5.0)
ALK PHOS: 128 U/L — AB (ref 38–126)
ALT: 49 U/L (ref 14–54)
AST: 118 U/L — ABNORMAL HIGH (ref 15–41)
Anion gap: 19 — ABNORMAL HIGH (ref 5–15)
BILIRUBIN TOTAL: 1.5 mg/dL — AB (ref 0.3–1.2)
BUN: 10 mg/dL (ref 6–20)
CALCIUM: 8.2 mg/dL — AB (ref 8.9–10.3)
CO2: 25 mmol/L (ref 22–32)
Chloride: 95 mmol/L — ABNORMAL LOW (ref 101–111)
Creatinine, Ser: 0.66 mg/dL (ref 0.44–1.00)
GFR calc Af Amer: >60 mL/min (ref >60)
GFR calc non Af Amer: >60 mL/min (ref >60)
GLUCOSE: 106 mg/dL — AB (ref 65–99)
Potassium: 3.3 mmol/L — ABNORMAL LOW (ref 3.5–5.1)
Sodium: 139 mmol/L (ref 135–145)
TOTAL PROTEIN: 6.9 g/dL (ref 6.5–8.1)

## 2016-11-29 LAB — CBC
HCT: 33 % — ABNORMAL LOW (ref 36.0–46.0)
HEMOGLOBIN: 11.5 g/dL — AB (ref 12.0–15.0)
MCH: 32.7 pg (ref 26.0–34.0)
MCHC: 34.8 g/dL (ref 30.0–36.0)
MCV: 93.8 fL (ref 78.0–100.0)
PLATELETS: 155 10*3/uL (ref 150–400)
RBC: 3.52 MIL/uL — ABNORMAL LOW (ref 3.87–5.11)
RDW: 15.4 % (ref 11.5–15.5)
WBC: 3.6 10*3/uL — ABNORMAL LOW (ref 4.0–10.5)

## 2016-11-29 LAB — RAPID URINE DRUG SCREEN, HOSP PERFORMED
AMPHETAMINES: NOT DETECTED
BARBITURATES: NOT DETECTED
BENZODIAZEPINES: POSITIVE — AB
COCAINE: NOT DETECTED
Opiates: NOT DETECTED
TETRAHYDROCANNABINOL: NOT DETECTED

## 2016-11-29 LAB — TROPONIN I: TROPONIN I: 0.03 ng/mL — AB (ref <0.03)

## 2016-11-29 LAB — LIPASE, BLOOD: Lipase: 41 U/L (ref 11–51)

## 2016-11-29 LAB — ETHANOL: Alcohol, Ethyl (B): <5 mg/dL (ref <5)

## 2016-11-29 MED ORDER — RANITIDINE HCL 150 MG/10ML PO SYRP
150.0000 mg | ORAL_SOLUTION | Freq: Once | ORAL | Status: AC
  Administered 2016-11-29: 150 mg via ORAL
  Filled 2016-11-29: qty 10

## 2016-11-29 MED ORDER — IBUPROFEN 100 MG/5ML PO SUSP
800.0000 mg | Freq: Once | ORAL | Status: AC
  Administered 2016-11-29: 800 mg via ORAL
  Filled 2016-11-29: qty 40

## 2016-11-29 MED ORDER — CHLORDIAZEPOXIDE HCL 25 MG PO CAPS
50.0000 mg | ORAL_CAPSULE | Freq: Once | ORAL | Status: AC
  Administered 2016-11-29: 50 mg via ORAL
  Filled 2016-11-29: qty 2

## 2016-11-29 MED ORDER — IOPAMIDOL (ISOVUE-300) INJECTION 61%
100.0000 mL | Freq: Once | INTRAVENOUS | Status: AC | PRN
  Administered 2016-11-29: 100 mL via INTRAVENOUS

## 2016-11-29 MED ORDER — FENTANYL CITRATE (PF) 100 MCG/2ML IJ SOLN
50.0000 ug | Freq: Once | INTRAMUSCULAR | Status: AC
  Administered 2016-11-29: 50 ug via INTRAVENOUS
  Filled 2016-11-29: qty 2

## 2016-11-29 MED ORDER — ONDANSETRON HCL 4 MG/2ML IJ SOLN
4.0000 mg | Freq: Once | INTRAMUSCULAR | Status: AC | PRN
  Administered 2016-11-29: 4 mg via INTRAVENOUS
  Filled 2016-11-29: qty 2

## 2016-11-29 MED ORDER — GI COCKTAIL ~~LOC~~
30.0000 mL | Freq: Once | ORAL | Status: AC
  Administered 2016-11-29: 30 mL via ORAL
  Filled 2016-11-29: qty 30

## 2016-11-29 MED ORDER — SODIUM CHLORIDE 0.9 % IV BOLUS (SEPSIS)
1000.0000 mL | Freq: Once | INTRAVENOUS | Status: AC
  Administered 2016-11-29: 1000 mL via INTRAVENOUS

## 2016-11-29 MED ORDER — CHLORDIAZEPOXIDE HCL 25 MG PO CAPS: ORAL_CAPSULE | ORAL | 0 refills | Status: DC

## 2016-11-29 MED ORDER — ONDANSETRON HCL 4 MG/2ML IJ SOLN
4.0000 mg | Freq: Once | INTRAMUSCULAR | Status: AC
  Administered 2016-11-29: 4 mg via INTRAVENOUS
  Filled 2016-11-29: qty 2

## 2016-11-29 MED ORDER — ONDANSETRON HCL 4 MG PO TABS: 4.0000 mg | ORAL_TABLET | Freq: Three times a day (TID) | ORAL | 0 refills | Status: DC | PRN

## 2016-11-29 MED ORDER — IOPAMIDOL (ISOVUE-300) INJECTION 61%
INTRAVENOUS | Status: AC
  Filled 2016-11-29: qty 100

## 2016-11-29 NOTE — ED Notes (Signed)
Pt reported, "I had been drinking for 5 days straight, but I didn't drink since yesterday and I feel like I'm dying".

## 2016-11-29 NOTE — ED Notes (Signed)
Requested urine from patient. 

## 2016-11-29 NOTE — ED Provider Notes (Signed)
[facility] Provider Note   CSN: [id number] Arrival date & time: 11/29/16  0086     History   Chief Complaint Chief Complaint  Patient presents with  . Alcohol Intoxication  . detox  . Abdominal Pain    HPI [name] is a 56 y.o. female.   Alcohol Intoxication  This is a recurrent problem. The problem occurs constantly. The problem has not changed since onset.Associated symptoms include abdominal pain. Nothing aggravates the symptoms. Nothing relieves the symptoms. She has tried nothing for the symptoms.  Abdominal Pain   Associated symptoms include nausea.  recently relapsed alcoholic here with some abdominal pain after 5 days of binge drinking. No fevers. Has nausea, no vomiting. No diarrhea or constipation. Similar to previous episodes associated with drinking.   Past Medical History:  Diagnosis Date  . Allergy    SEASONAL  . Anemia   . Arthritis    "hips, shoulders, hands" (11/17/2016)  . ASD (atrial septal defect)    a. s/p ASD repair in 1973  . CAD (coronary artery disease)    a. 12/2015: NSTEMI occurring after cardiac arrest secondary to aspiration PNA. Echo w/ EF of 55-60%, no WMA. Outpt ischemic eval needed.  . Cardiomyopathy   . Chronic bronchitis ([place])   . Chronic lower back pain   . Deaf    "not totally deaf; read lips and can hear some" (11/17/2016)  . Depression   . Dysphagia    due to esophageal infections.  . Fibromyalgia   . Gastritis   . GERD (gastroesophageal reflux disease)   . Hearing impairment   . High cholesterol   . History of hiatal hernia   . History of kidney stones   . Hypertension    [name], take htn medication to regulate heart beat.  . Meningitis spinal 1967  . Migraine    "all through my life" (11/17/2016)  . Osteoporosis   . Pneumonia    "several times" (11/17/2016)  . PTSD (post-traumatic stress disorder) 12/2015   "S/P esophagus was paralyzed and I choked to death; they did code blue; brought me back" (11/17/2016)   . Pulmonary embolism ([place]) 2003   occured post c-section of her daughter  . Sleep apnea   . Vitamin D deficiency     Patient Active Problem List   Diagnosis Date Noted  . Pancreatitis 11/17/2016  . Pancytopenia ([place])   . Candida infection, esophageal ([place])   . Esophageal ring, acquired   . Neurocardiogenic syncope 07/19/2016  . Leukocytopenia 07/19/2016  . Macrocytosis 07/19/2016  . Abdominal pain, epigastric   . Bradycardia   . Blepharitis of both eyes 02/03/2016  . Cardiac arrest ([place]) 12/22/2015  . Uncontrolled hypertension   . Aspiration pneumonia of right lower lobe due to gastric secretions ([place])   . Hypokalemia   . Pneumonia   . NSTEMI (non-ST elevated myocardial infarction) ([place]) 12/19/2015  . Typical atrial flutter ([place])   . Pressure ulcer 12/18/2015  . Protein-calorie malnutrition, severe 12/18/2015  . Metabolic acidosis 76/19/5093  . Lactic acidosis 12/17/2015  . Leucocytosis 12/17/2015  . Dehydration 12/17/2015  . Starvation ketoacidosis 12/17/2015  . Alcohol abuse 12/17/2015  . Increased anion gap metabolic acidosis 26/71/2458  . Essential hypertension 12/06/2015  . Accidental drug overdose 10/26/2015  . Aspiration pneumonia ([place]) 10/23/2015  . Acute respiratory failure with hypoxia ([place]) 10/23/2015  . Acute kidney injury ([place]) 10/23/2015  . Transaminitis 10/23/2015  . Positive blood culture 10/23/2015  . Elevated troponin 10/23/2015  .  Thrombocytopenia ([place]) 10/23/2015  . Normocytic anemia 10/23/2015  . Shock ([place]) 10/19/2015  . Major depressive disorder, recurrent episode, moderate ([place]) 09/18/2014  . Non-traumatic compression fracture of vertebral column with routine healing 07/17/2014  . Osteoporosis 07/17/2014  . H/O vitamin D deficiency 07/17/2014  . Odynophagia 07/09/2014  . Esophageal dysphagia 07/08/2014  . Chest pain 05/20/2014  . Nausea & vomiting 05/20/2014  . 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  Major depression, chronic ([place]) 03/29/2014  . Breath shortness 03/29/2014  . Hypercholesterolemia without hypertriglyceridemia 03/29/2014  . Fatigue 03/29/2014  . Classical migraine with intractable migraine 03/29/2014  . Disorder of mitral valve 03/29/2014  . Atypical migraine 03/29/2014  . Atrial septal defect of fossa ovalis 03/29/2014  . HLD (hyperlipidemia) 03/29/2014  . Arthralgia of multiple joints 03/29/2014  . Anemia, iron deficiency 03/29/2014  . Fibrositis 03/29/2014  . Abdominal pain 12/20/2013  . Absolute anemia 09/20/2013  . Bronchitis 09/20/2013  . Vaginitis and vulvovaginitis 09/20/2013  . Family history of breast cancer 07/13/2013  . Palpitation 07/02/2013  . History of mitral valve repair 07/02/2013  . Status post patch closure of ASD 07/02/2013  . Hyperlipidemia 07/02/2013  . OAB (overactive bladder) 06/13/2013  . Ovarian cyst, left 03/15/2013  . Vaginal lesion 03/15/2013  . Ovarian mass 03/15/2013  . Maternal DVT (deep vein thrombosis), history of 02/26/2013  . PMB (postmenopausal bleeding) 02/26/2013  . Alcohol dependence ([place]) 11/07/2012  . Unspecified vitamin D deficiency 08/17/2012  . L1 vertebral fracture ([place]) 08/01/2012  . Spinal compression fracture ([place]) 07/12/2012  . Menopausal state 07/12/2012  . Psoriasis 07/12/2012  . Severe episode of recurrent major depressive disorder ([place]) 12/07/2011  . Generalized anxiety disorder 12/07/2011  . Cardiomyopathy 03/16/2011  . Hearing impairment   . Migraine   . Arthritis     Past Surgical History:  Procedure Laterality Date  . APPENDECTOMY    . ATRIAL SEPTAL DEFECT(ASD) CLOSURE  1973  . [place]  . CESAREAN SECTION  2003  . COCHLEAR IMPLANT Right   . ESOPHAGOGASTRODUODENOSCOPY (EGD) WITH PROPOFOL N/A 07/20/2016   Procedure: ESOPHAGOGASTRODUODENOSCOPY (EGD) WITH PROPOFOL;  Surgeon: Gatha Mayer, MD;  Location: [facility];  Service: Endoscopy;  Laterality: N/A;  . EYE MUSCLE  SURGERY Bilateral   . INCISION AND DRAINAGE OF WOUND  1993   S/P c-section  . LAPAROSCOPIC CHOLECYSTECTOMY  2012  . MITRAL VALVE REPAIR  05/2005  . TONSILLECTOMY AND ADENOIDECTOMY    . TUBAL LIGATION  1993  . TYMPANOSTOMY TUBE PLACEMENT Bilateral "lots"    OB History    Gravida Para Term Preterm AB Living   2 2 2  0 0     SAB TAB Ectopic Multiple Live Births   0 0 0           Home Medications    Prior to Admission medications   Medication Sig Start Date End Date Taking? Authorizing Provider  acetaminophen (TYLENOL) 325 MG tablet Take 2 tablets (650 mg total) by mouth every 6 (six) hours as needed for mild pain (or Fever >/= 101). 11/20/16  Yes [name], MD  aspirin 81 MG chewable tablet Chew 81 mg by mouth daily.   Yes [provider]  atorvastatin (LIPITOR) 40 MG tablet Take 1 tablet (40 mg total) by mouth daily at 6 PM. 02/02/16  Yes [name], MD  Cholecalciferol (VITAMIN D3) 2000 units TABS Take 2,000 Units by mouth daily.    Yes [provider]  fexofenadine (ALLEGRA ALLERGY) 180 MG tablet Take 180 mg by mouth daily.   Yes [provider]  fluticasone (FLONASE) 50 MCG/ACT nasal spray Place 2 sprays into both nostrils daily.   Yes [provider]  metoprolol tartrate (LOPRESSOR) 25 MG tablet Take 1 tablet (25 mg total) by mouth 2 (two) times daily. 07/22/16  Yes [name], MD  nitroGLYCERIN (NITROSTAT) 0.4 MG SL tablet Place 1 tablet (0.4 mg total) under the tongue every 5 (five) minutes as needed for chest pain. 05/11/16  Yes [name], MD  Omega-3 Fatty Acids (FISH OIL) 1000 MG CAPS Take 1,000 mg by mouth 2 (two) times daily.    Yes [provider]  ondansetron (ZOFRAN ODT) 4 MG disintegrating tablet Take 1 tablet (4 mg total) by mouth every 8 (eight) hours as needed for nausea. 11/08/15  Yes [name], MD  pantoprazole (PROTONIX) 20 MG tablet Take 1 tablet (20 mg total) by mouth daily. 10/31/16  Yes Pfeiffer, Jeannie Done, MD    prazosin (MINIPRESS) 2 MG capsule Take 2 mg by mouth at bedtime.  02/17/16  Yes [provider]  [place] into the lungs at bedtime. CPAP   Yes [provider]  Vilazodone HCl (VIIBRYD) 40 MG TABS Take 40 mg by mouth daily.   Yes [provider]  vitamin B-12 (CYANOCOBALAMIN) 500 MCG tablet Take 500 mcg by mouth daily.   Yes [provider]  chlordiazePOXIDE (LIBRIUM) 25 MG capsule 50mg  PO TID x 2D, then 25-50mg  PO BID X 2D, then 25-50mg  PO QD X 1D 11/29/16   Tina Temme, Corene Cornea, MD  ondansetron (ZOFRAN) 4 MG tablet Take 1 tablet (4 mg total) by mouth every 8 (eight) hours as needed for nausea or vomiting. 11/29/16   Terez Montee, Corene Cornea, MD    Family History Family History  Problem Relation Age of Onset  . Breast cancer Mother        bilateral; ages 48 and 23; TAH/BSO ~50  . Depression Sister   . Heart disease Father   . Hypertension Father   . Heart attack Father   . Colon cancer Neg Hx   . Esophageal cancer Neg Hx   . Pancreatic cancer Neg Hx   . Rectal cancer Neg Hx   . Stomach cancer Neg Hx     Social History Social History  Substance Use Topics  . Smoking status: Never Smoker  . Smokeless tobacco: Never Used  . Alcohol use 0.0 oz/week     Comment: 11/17/2016 "I'm trying to be a recovering alcoholic; last drink was 1 pint of vodka 1 wk ago"     Allergies   Depakote [divalproex sodium]; Diazepam; Valium; and Tizanidine hcl   Review of Systems Review of Systems  Gastrointestinal: Positive for abdominal pain and nausea.  Psychiatric/Behavioral: Positive for dysphoric mood. Negative for suicidal ideas. The patient is nervous/anxious.   All other systems reviewed and are negative.    Physical Exam Updated Vital Signs BP (!) 140/94 (BP Location: Right Arm)   Pulse 97   Temp 98.4 F (36.9 C) (Oral)   Resp 16   Ht 5\' 2"  (1.575 m)   Wt 59.4 kg (131 lb)   SpO2 97%   BMI 23.96 kg/m   Physical Exam  Constitutional: She  appears well-developed and well-nourished.  HENT:  Head: Normocephalic and atraumatic.  Eyes: Conjunctivae and EOM are normal.  Neck: Normal range of motion.  Cardiovascular: Regular rhythm.  Tachycardia present.   Pulmonary/Chest: No stridor.  No respiratory distress.  Abdominal: She exhibits no distension.  Neurological: She is alert.  Skin: Skin is warm and dry. No erythema. No pallor.  Nursing note and vitals reviewed.    ED Treatments / Results  Labs (all labs ordered are listed, but only abnormal results are displayed) Labs Reviewed  COMPREHENSIVE METABOLIC PANEL - Abnormal; Notable for the following:       Result Value   Potassium 3.3 (*)    Chloride 95 (*)    Glucose, Bld 106 (*)    Calcium 8.2 (*)    AST 118 (*)    Alkaline Phosphatase 128 (*)    Total Bilirubin 1.5 (*)    Anion gap 19 (*)    All other components within normal limits  CBC - Abnormal; Notable for the following:    WBC 3.6 (*)    RBC 3.52 (*)    Hemoglobin 11.5 (*)    HCT 33.0 (*)    All other components within normal limits  URINALYSIS, ROUTINE W REFLEX MICROSCOPIC - Abnormal; Notable for the following:    Ketones, ur 80 (*)    Squamous Epithelial / LPF 0-5 (*)    All other components within normal limits  RAPID URINE DRUG SCREEN, HOSP PERFORMED - Abnormal; Notable for the following:    Benzodiazepines POSITIVE (*)    All other components within normal limits  TROPONIN I - Abnormal; Notable for the following:    Troponin I 0.03 (*)    All other components within normal limits  LIPASE, BLOOD  ETHANOL  TROPONIN I    EKG  EKG Interpretation  Date/Time:  Monday November 29 2016 07:57:16 EDT Ventricular Rate:  100 PR Interval:    QRS Duration: 61 QT Interval:  402 QTC Calculation: 519 R Axis:   59 Text Interpretation:  Sinus tachycardia Low voltage, precordial leads Nonspecific T abnrm, anterolateral leads Prolonged QT interval No significant change since last tracing july 11 Confirmed by  Merrily Pew [phone]) on 11/29/2016 8:30:09 AM       Radiology Ct Abdomen Pelvis W Contrast  Result Date: 11/29/2016 CLINICAL DATA:  Abdominal pain.  Nausea and vomiting.  Diarrhea. EXAM: CT ABDOMEN AND PELVIS WITH CONTRAST TECHNIQUE: Multidetector CT imaging of the abdomen and pelvis was performed using the standard protocol following bolus administration of intravenous contrast. CONTRAST:  119mL ISOVUE-300 IOPAMIDOL (ISOVUE-300) INJECTION 61% COMPARISON:  CT scan dated 12/06/2015 FINDINGS: Lower chest: No acute abnormality. Moderate hiatal hernia. Focal eventration of the left hemidiaphragm posteriorly with herniation of peritoneal fat, unchanged. Prosthetic mitral valve. Hepatobiliary: Hepatic steatosis. Gallbladder has been removed. No dilatation of the biliary tree. Pancreas: Unremarkable. No pancreatic ductal dilatation or surrounding inflammatory changes. Spleen: Normal in size without focal abnormality. Adrenals/Urinary Tract: Adrenal glands are unremarkable. Kidneys are normal, without renal calculi, focal lesion, or hydronephrosis. Bladder is unremarkable. Stomach/Bowel: Stomach is within normal limits except for a moderate hiatal hernia. Appendix has been removed. No evidence of bowel wall thickening, distention, or inflammatory changes. Diverticulosis of the distal colon. Vascular/Lymphatic: Aortic atherosclerosis. No enlarged abdominal or pelvic lymph nodes. Reproductive: Uterus and bilateral adnexa are unremarkable. Other: No abdominal wall hernia or abnormality. No abdominopelvic ascites. Musculoskeletal: No acute findings. Multilevel degenerative disc disease in the lumbar spine. Old mild compression fracture of L1. IMPRESSION: 1. Hepatic steatosis. 2. Moderate hiatal hernia, chronic. 3. Aortic atherosclerosis. 4. No acute abnormalities. 5. Scattered diverticula in the distal colon. Electronically Signed   By: Lorriane Shire M.D.   On: 11/29/2016  10:32    Procedures Procedures (including  critical care time)  Medications Ordered in ED Medications  ondansetron (ZOFRAN) injection 4 mg (4 mg Intravenous Given 11/29/16 0620)  sodium chloride 0.9 % bolus 1,000 mL (0 mLs Intravenous Stopped 11/29/16 1108)  fentaNYL (SUBLIMAZE) injection 50 mcg (50 mcg Intravenous Given 11/29/16 0744)  gi cocktail (Maalox,Lidocaine,Donnatal) (30 mLs Oral Given 11/29/16 0744)  ondansetron (ZOFRAN) injection 4 mg (4 mg Intravenous Given 11/29/16 0744)  ranitidine (ZANTAC) 150 MG/10ML syrup 150 mg (150 mg Oral Given 11/29/16 0826)  iopamidol (ISOVUE-300) 61 % injection 100 mL (100 mLs Intravenous Contrast Given 11/29/16 1000)  sodium chloride 0.9 % bolus 1,000 mL (0 mLs Intravenous Stopped 11/29/16 1533)  chlordiazePOXIDE (LIBRIUM) capsule 50 mg (50 mg Oral Given 11/29/16 1435)  ibuprofen (ADVIL,MOTRIN) 100 MG/5ML suspension 800 mg (800 mg Oral Given 11/29/16 1435)     Initial Impression / Assessment and Plan / ED Course  I have reviewed the triage vital signs and the nursing notes.  Pertinent labs & imaging results that were available during my care of the patient were reviewed by me and considered in my medical decision making (see chart for details).    Likely gastritis associated with drinking. However is high risk for pancreatic/GB/hepatic/cardiac causes so will eval for same and treat symptoms.   Workup unremarkable. Suspect uncomplicated alcohol withdrawal. Started on Librium with some improvement in her heart rate in her symptoms. Patient discharged on Librium taper with PCP follow.  Final Clinical Impressions(s) / ED Diagnoses   Final diagnoses:  Epigastric abdominal pain  ETOH abuse    New Prescriptions Discharge Medication List as of 11/29/2016  3:17 PM    START taking these medications   Details  chlordiazePOXIDE (LIBRIUM) 25 MG capsule 50mg  PO TID x 2D, then 25-50mg  PO BID X 2D, then 25-50mg  PO QD X 1D, Print    ondansetron (ZOFRAN) 4 MG tablet Take 1 tablet (4 mg total) by mouth  every 8 (eight) hours as needed for nausea or vomiting., Starting Mon 11/29/2016, Print         Libby Goehring, Corene Cornea, MD 11/29/16 1728

## 2016-11-29 NOTE — ED Notes (Signed)
Patient has no HI or SI

## 2016-11-29 NOTE — ED Triage Notes (Addendum)
Patient is intoxication. Patient states she had a bottle of alcohol this morning. Patient called ems for her to come to the hospital for detox. Patient is dry heaving. Patient is complaining of abdominal pain all over.

## 2016-12-02 DIAGNOSIS — R69 Illness, unspecified: Secondary | ICD-10-CM | POA: Diagnosis not present

## 2016-12-03 DIAGNOSIS — H01021 Squamous blepharitis right upper eyelid: Secondary | ICD-10-CM | POA: Diagnosis not present

## 2016-12-03 DIAGNOSIS — H01024 Squamous blepharitis left upper eyelid: Secondary | ICD-10-CM | POA: Diagnosis not present

## 2016-12-03 DIAGNOSIS — H01025 Squamous blepharitis left lower eyelid: Secondary | ICD-10-CM | POA: Diagnosis not present

## 2016-12-03 DIAGNOSIS — H01022 Squamous blepharitis right lower eyelid: Secondary | ICD-10-CM | POA: Diagnosis not present

## 2016-12-07 DIAGNOSIS — M25512 Pain in left shoulder: Secondary | ICD-10-CM | POA: Diagnosis not present

## 2016-12-07 DIAGNOSIS — M8589 Other specified disorders of bone density and structure, multiple sites: Secondary | ICD-10-CM | POA: Diagnosis not present

## 2016-12-13 ENCOUNTER — Encounter: Payer: Self-pay | Admitting: Gynecology

## 2016-12-13 ENCOUNTER — Encounter: Payer: Self-pay | Admitting: Anesthesiology

## 2016-12-16 DIAGNOSIS — R69 Illness, unspecified: Secondary | ICD-10-CM | POA: Diagnosis not present

## 2016-12-23 DIAGNOSIS — R69 Illness, unspecified: Secondary | ICD-10-CM | POA: Diagnosis not present

## 2016-12-30 DIAGNOSIS — F1320 Sedative, hypnotic or anxiolytic dependence, uncomplicated: Secondary | ICD-10-CM | POA: Diagnosis not present

## 2016-12-30 DIAGNOSIS — Y900 Blood alcohol level of less than 20 mg/100 ml: Secondary | ICD-10-CM | POA: Diagnosis not present

## 2016-12-30 DIAGNOSIS — Z780 Asymptomatic menopausal state: Secondary | ICD-10-CM | POA: Diagnosis not present

## 2016-12-30 DIAGNOSIS — E7800 Pure hypercholesterolemia, unspecified: Secondary | ICD-10-CM | POA: Diagnosis not present

## 2016-12-30 DIAGNOSIS — N3000 Acute cystitis without hematuria: Secondary | ICD-10-CM | POA: Diagnosis not present

## 2016-12-30 DIAGNOSIS — Z7982 Long term (current) use of aspirin: Secondary | ICD-10-CM | POA: Diagnosis not present

## 2016-12-30 DIAGNOSIS — M797 Fibromyalgia: Secondary | ICD-10-CM | POA: Diagnosis not present

## 2016-12-30 DIAGNOSIS — R69 Illness, unspecified: Secondary | ICD-10-CM | POA: Diagnosis not present

## 2016-12-30 DIAGNOSIS — Z8701 Personal history of pneumonia (recurrent): Secondary | ICD-10-CM | POA: Diagnosis not present

## 2016-12-30 DIAGNOSIS — B9789 Other viral agents as the cause of diseases classified elsewhere: Secondary | ICD-10-CM | POA: Diagnosis not present

## 2016-12-30 DIAGNOSIS — H905 Unspecified sensorineural hearing loss: Secondary | ICD-10-CM | POA: Diagnosis not present

## 2016-12-30 DIAGNOSIS — F332 Major depressive disorder, recurrent severe without psychotic features: Secondary | ICD-10-CM | POA: Diagnosis not present

## 2016-12-30 DIAGNOSIS — R509 Fever, unspecified: Secondary | ICD-10-CM | POA: Diagnosis not present

## 2017-01-01 DIAGNOSIS — R69 Illness, unspecified: Secondary | ICD-10-CM | POA: Diagnosis not present

## 2017-01-01 DIAGNOSIS — F332 Major depressive disorder, recurrent severe without psychotic features: Secondary | ICD-10-CM | POA: Diagnosis not present

## 2017-01-01 DIAGNOSIS — F1320 Sedative, hypnotic or anxiolytic dependence, uncomplicated: Secondary | ICD-10-CM | POA: Diagnosis not present

## 2017-01-02 DIAGNOSIS — F1320 Sedative, hypnotic or anxiolytic dependence, uncomplicated: Secondary | ICD-10-CM | POA: Diagnosis not present

## 2017-01-02 DIAGNOSIS — R69 Illness, unspecified: Secondary | ICD-10-CM | POA: Diagnosis not present

## 2017-01-02 DIAGNOSIS — F332 Major depressive disorder, recurrent severe without psychotic features: Secondary | ICD-10-CM | POA: Diagnosis not present

## 2017-01-03 DIAGNOSIS — F1320 Sedative, hypnotic or anxiolytic dependence, uncomplicated: Secondary | ICD-10-CM | POA: Diagnosis not present

## 2017-01-03 DIAGNOSIS — R69 Illness, unspecified: Secondary | ICD-10-CM | POA: Diagnosis not present

## 2017-01-03 DIAGNOSIS — F332 Major depressive disorder, recurrent severe without psychotic features: Secondary | ICD-10-CM | POA: Diagnosis not present

## 2017-01-06 DIAGNOSIS — R69 Illness, unspecified: Secondary | ICD-10-CM | POA: Diagnosis not present

## 2017-01-07 ENCOUNTER — Ambulatory Visit (AMBULATORY_SURGERY_CENTER): Payer: Medicare HMO | Admitting: Gastroenterology

## 2017-01-07 ENCOUNTER — Encounter: Payer: Self-pay | Admitting: Gastroenterology

## 2017-01-07 VITALS — BP 115/76 | HR 92 | Temp 97.5°F | Resp 13 | Ht 63.0 in | Wt 122.0 lb

## 2017-01-07 DIAGNOSIS — K222 Esophageal obstruction: Secondary | ICD-10-CM | POA: Diagnosis not present

## 2017-01-07 DIAGNOSIS — K2990 Gastroduodenitis, unspecified, without bleeding: Secondary | ICD-10-CM

## 2017-01-07 DIAGNOSIS — K209 Esophagitis, unspecified without bleeding: Secondary | ICD-10-CM

## 2017-01-07 DIAGNOSIS — R1310 Dysphagia, unspecified: Secondary | ICD-10-CM | POA: Diagnosis not present

## 2017-01-07 DIAGNOSIS — K2970 Gastritis, unspecified, without bleeding: Secondary | ICD-10-CM | POA: Diagnosis not present

## 2017-01-07 DIAGNOSIS — K449 Diaphragmatic hernia without obstruction or gangrene: Secondary | ICD-10-CM | POA: Diagnosis not present

## 2017-01-07 MED ORDER — SODIUM CHLORIDE 0.9 % IV SOLN: 500.0000 mL | INTRAVENOUS | Status: DC

## 2017-01-07 MED ORDER — OMEPRAZOLE 40 MG PO CPDR: 40.0000 mg | DELAYED_RELEASE_CAPSULE | Freq: Every day | ORAL | 11 refills | Status: DC

## 2017-01-07 MED ORDER — FLUCONAZOLE 100 MG PO TABS: 100.0000 mg | ORAL_TABLET | Freq: Every day | ORAL | 0 refills | Status: AC

## 2017-01-07 NOTE — Progress Notes (Signed)
A/ox3 pleased with MAC, report to Sheila RN 

## 2017-01-07 NOTE — Op Note (Signed)
[facility] Patient Name: [name] Procedure Date: 01/07/2017 8:54 AM MRN: [id number] Endoscopist: Milus Banister , MD Age: 56 Referring MD:  Date of Birth: March 22, 1961 Gender: Female Account #: [id number] Procedure:                Upper GI endoscopy Indications:              Dysphagia, h/o Dysphagia fromDysphagia recurrent                            yeast infections:11/15 EGD Dr. Ardis Hughs found [place],                            tortuous esophagus, again candida esophagitis,                            treated with diflucan. Office visit April 2016 she                            reported that she has always had trouble with used                            infections after antibiotics. Even when she was a                            little girl. She will get vaginal infections,                            abdominal wall infections. Lately she has had                            trouble with esophageal yeast infections.EGD                            07/2016 Dr. Carlean Purl showed candida esophagitis again                            and also Schatzki's ring was dilated with scope                            passage (38mm up to 14-90mm). Treated fungal                            infection again. 2018 Edentulous now, wears uppers                            only. Medicines:                Monitored Anesthesia Care Procedure:                Pre-Anesthesia Assessment:                           - Prior to the procedure, a History and Physical  was performed, and patient medications and                            allergies were reviewed. The patient's tolerance of                            previous anesthesia was also reviewed. The risks                            and benefits of the procedure and the sedation                            options and risks were discussed with the patient.                            All questions were answered, and informed consent                             was obtained. Prior Anticoagulants: The patient has                            taken no previous anticoagulant or antiplatelet                            agents. ASA Grade Assessment: III - A patient with                            severe systemic disease. After reviewing the risks                            and benefits, the patient was deemed in                            satisfactory condition to undergo the procedure.                           After obtaining informed consent, the endoscope was                            passed under direct vision. Throughout the                            procedure, the patient's blood pressure, pulse, and                            oxygen saturations were monitored continuously. The                            Endoscope was introduced through the mouth, and                            advanced to the second part of duodenum. The upper  GI endoscopy was accomplished without difficulty.                            The patient tolerated the procedure well. Scope In: Scope Out: Findings:                 There was obvious, mild yeast infection in the                            proximal esophagus.                           There was a medium sized hiatal hernia and focal                            appearing peptic stricture at the GE junction which                            was dilated with simple scope passage (causing                            short linear superficial tear). There was mild                            reflux esophagitis at the site as well.                           Previously noted moderate gastritis, likely related                            to chronic alcohol abuse.                           The exam was otherwise without abnormality. Complications:            No immediate complications. Estimated blood loss:                            None. Estimated Blood Loss:     Estimated blood  loss: none. Impression:               - Recurrent esophageal yeast infection.                           - Peptic appearing mild stricture, dilated with                            scope passage.                           - Reflux esophagitis, mild.                           - Medium sized hiatal hernia.                           - Chronic gastritis, likely etoh related. Recommendation:           -  Patient has a contact number available for                            emergencies. The signs and symptoms of potential                            delayed complications were discussed with the                            patient. Return to normal activities tomorrow.                            Written discharge instructions were provided to the                            patient.                           - New prescription for diflucan called in (100mg                             pill, take one pill once daily for 10 days, no                            refills).                           - New prescription for higher strength antiacid                            medicine called in (omeprazole 40mg  pill, take one                            pill 20-30 min before breakfast meal daily, disp 30                            with 11 refills). OK to stop your protonix.                           - You should wear upper and lower dentures when                            eating; chew well, eat slowly and take small bites.                           - Dr. Ardis Hughs' office will contact you about follow                            up office appt in 7-8 weeks to discuss liver,                            esophagus further. Milus Banister, MD 01/07/2017 9:15:50 AM This report has been signed electronically.

## 2017-01-07 NOTE — Patient Instructions (Signed)
YOU HAD AN ENDOSCOPIC PROCEDURE TODAY AT [facility]:   Refer to the procedure report that was given to you for any specific questions about what was found during the examination.  If the procedure report does not answer your questions, please call your gastroenterologist to clarify.  If you requested that your care partner not be given the details of your procedure findings, then the procedure report has been included in a sealed envelope for you to review at your convenience later.  YOU SHOULD EXPECT: Some feelings of bloating in the abdomen. Passage of more gas than usual.  Walking can help get rid of the air that was put into your GI tract during the procedure and reduce the bloating. If you had a lower endoscopy (such as a colonoscopy or flexible sigmoidoscopy) you may notice spotting of blood in your stool or on the toilet paper. If you underwent a bowel prep for your procedure, you may not have a normal bowel movement for a few days.  Please Note:  You might notice some irritation and congestion in your nose or some drainage.  This is from the oxygen used during your procedure.  There is no need for concern and it should clear up in a day or so.  SYMPTOMS TO REPORT IMMEDIATELY:     Following upper endoscopy (EGD)  Vomiting of blood or coffee ground material  New chest pain or pain under the shoulder blades  Painful or persistently difficult swallowing  New shortness of breath  Fever of 100F or higher  Black, tarry-looking stools  For urgent or emergent issues, a gastroenterologist can be reached at any hour by calling [phone].   DIET:  We do recommend a small meal at first, but then you may proceed to your regular diet.  Drink plenty of fluids but you should avoid alcoholic beverages for 24 hours.  ACTIVITY:  You should plan to take it easy for the rest of today and you should NOT DRIVE or use heavy machinery until tomorrow (because of the sedation medicines  used during the test).    FOLLOW UP: Our staff will call the number listed on your records the next business day following your procedure to check on you and address any questions or concerns that you may have regarding the information given to you following your procedure. If we do not reach you, we will leave a message.  However, if you are feeling well and you are not experiencing any problems, there is no need to return our call.  We will assume that you have returned to your regular daily activities without incident.  If any biopsies were taken you will be contacted by phone or by letter within the next 1-3 weeks.  Please call us at [phone] if you have not heard about the biopsies in 3 weeks.    SIGNATURES/CONFIDENTIALITY: You and/or your care partner have signed paperwork which will be entered into your electronic medical record.  These signatures attest to the fact that that the information above on your After Visit Summary has been reviewed and is understood.  Full responsibility of the confidentiality of this discharge information lies with you and/or your care-partner.   Stop protonix and start omeprazole 40 mg take 20-3- minutes before breakfast. Resume remainder of medications.

## 2017-01-11 ENCOUNTER — Telehealth: Payer: Self-pay

## 2017-01-11 ENCOUNTER — Telehealth: Payer: Self-pay | Admitting: *Deleted

## 2017-01-11 NOTE — Telephone Encounter (Signed)
Left message on f/u call 

## 2017-01-11 NOTE — Telephone Encounter (Signed)
Attempted to reach pt. With follow up call following endoscopic procedure 01/07/2017.  LM on pt. Ans. Machine to call if she has any questions or concerns.

## 2017-01-13 DIAGNOSIS — R69 Illness, unspecified: Secondary | ICD-10-CM | POA: Diagnosis not present

## 2017-01-20 DIAGNOSIS — R69 Illness, unspecified: Secondary | ICD-10-CM | POA: Diagnosis not present

## 2017-01-27 DIAGNOSIS — R69 Illness, unspecified: Secondary | ICD-10-CM | POA: Diagnosis not present

## 2017-01-31 ENCOUNTER — Other Ambulatory Visit: Payer: Self-pay | Admitting: Family Medicine

## 2017-01-31 ENCOUNTER — Telehealth: Payer: Self-pay

## 2017-01-31 NOTE — Telephone Encounter (Signed)
Per the pt's mother she is requesting that an rx be sent again for the prescriptions that were called in the day of her procedure. She states she had them filled and picked them up for her daughter, but since the procedure her daughter "has lost her brain." She misplaced the prescriptions and cannot find them. She would also like to know if taking the medications will restore her brain.

## 2017-01-31 NOTE — Telephone Encounter (Signed)
Left message on machine to call back  

## 2017-02-01 ENCOUNTER — Telehealth: Payer: Self-pay | Admitting: *Deleted

## 2017-02-01 ENCOUNTER — Other Ambulatory Visit: Payer: Self-pay

## 2017-02-01 ENCOUNTER — Telehealth: Payer: Self-pay | Admitting: Family Medicine

## 2017-02-01 MED ORDER — METOPROLOL TARTRATE 25 MG PO TABS: 25.0000 mg | ORAL_TABLET | Freq: Two times a day (BID) | ORAL | 0 refills | Status: DC

## 2017-02-01 NOTE — Telephone Encounter (Signed)
The pt's mother states they found her medications and have no other concerns.

## 2017-02-01 NOTE — Telephone Encounter (Signed)
Pt mom is calling stating that the patient is very ill and that she is in bed and believes to be vomitting and needs her heart medication refill she does not know the name of the meds     Best number [phone]  Deland Pretty

## 2017-02-01 NOTE — Telephone Encounter (Signed)
Pt mom called back to see if we have reviewed the messaged and explained the procedure of a return call to her and that if she was having heart issues that she needed to take pt to emergency room   Best number [phone]

## 2017-02-01 NOTE — Telephone Encounter (Signed)
LMOM to call the ofc, message states pt having heart problems.

## 2017-02-03 DIAGNOSIS — R69 Illness, unspecified: Secondary | ICD-10-CM | POA: Diagnosis not present

## 2017-02-04 ENCOUNTER — Telehealth: Payer: Self-pay | Admitting: *Deleted

## 2017-02-04 NOTE — Telephone Encounter (Signed)
Med refill was sent to pharmacy on 02/01/17.

## 2017-02-07 ENCOUNTER — Telehealth: Payer: Self-pay | Admitting: Family Medicine

## 2017-02-07 ENCOUNTER — Telehealth: Payer: Self-pay

## 2017-02-07 NOTE — Telephone Encounter (Signed)
New Message  Pts mom voiced she is needing to speak to MD/nurse about a score of information but mainly needing a Medical Report Form.  Please f/u with pt

## 2017-02-07 NOTE — Telephone Encounter (Signed)
Called pt to schedule Medicare Annual Wellness Visit. -nr  

## 2017-02-10 ENCOUNTER — Telehealth: Payer: Self-pay | Admitting: Family Medicine

## 2017-02-10 NOTE — Telephone Encounter (Signed)
Please see phone message Sent to Dr. Brigitte Pulse - high priority.

## 2017-02-10 NOTE — Telephone Encounter (Signed)
PLEASE SEE PHONE MESSAGE FROM [facility] 02/07/17. I AM NOT SURE WHY THE ENCOUNTER IS CLOSED BUT PATIENT'S MOTHER IS WAITING FOR A CALL BACK AS SOON AS POSSIBLE. BEST PHONE [phone] (MOTHER'S NAME IS ANN DEAGON BECAUSE HER DAUGHTER IS HEARING IMPAIRED) [place]

## 2017-02-11 NOTE — Telephone Encounter (Signed)
I will need information from the [facility] on why they suspended her license.  The DMV has that huge medical clearance form that THEY mail the PATIENT who brings it in for a visit review and completion when a patient is eligible for license reinstatement (normally the $150 charge since it is so extensive).  These do NOT come to [place] and I do not know of any way to request this form- if mother states the [facility] told her we had to do this, if she could get the fax # and/or who to request from to get [place] stated, we can certainly fax in a request for info/eligibility/appropriate forms and see if we can get a response.  [name] [name]

## 2017-02-11 NOTE — Telephone Encounter (Signed)
Please call pt and f/u - I have no idea what "medical report form" is referring to. Thanks. Also, pt is overdue for a visit w/ me.   The 10/1 message states: New Message  Pts mom voiced she is needing to speak to MD/nurse about a score of information but mainly needing a Medical Report Form.  Please f/u with pt

## 2017-02-11 NOTE — Telephone Encounter (Signed)
Pts mother came into the office and stated that dmv has suspended her daughters license. Per pt mother DMV stated that only a physician can request a medical clearance form for driving. Please let us know if you would like for [place] to request form or if there is another way.

## 2017-02-14 NOTE — Telephone Encounter (Signed)
Lm to call back  See previous note

## 2017-02-15 NOTE — Telephone Encounter (Signed)
Multiple interactions with mother. Pt has OV on 10/10

## 2017-02-16 ENCOUNTER — Ambulatory Visit (INDEPENDENT_AMBULATORY_CARE_PROVIDER_SITE_OTHER): Payer: Medicare HMO | Admitting: Family Medicine

## 2017-02-16 ENCOUNTER — Encounter: Payer: Self-pay | Admitting: Family Medicine

## 2017-02-16 VITALS — BP 122/72 | HR 94 | Temp 98.7°F | Resp 17 | Ht 63.0 in | Wt 106.0 lb

## 2017-02-16 DIAGNOSIS — R1312 Dysphagia, oropharyngeal phase: Secondary | ICD-10-CM | POA: Diagnosis not present

## 2017-02-16 DIAGNOSIS — K861 Other chronic pancreatitis: Secondary | ICD-10-CM

## 2017-02-16 DIAGNOSIS — R634 Abnormal weight loss: Secondary | ICD-10-CM | POA: Diagnosis not present

## 2017-02-16 DIAGNOSIS — R1319 Other dysphagia: Secondary | ICD-10-CM

## 2017-02-16 DIAGNOSIS — R1310 Dysphagia, unspecified: Secondary | ICD-10-CM | POA: Diagnosis not present

## 2017-02-16 DIAGNOSIS — E860 Dehydration: Secondary | ICD-10-CM

## 2017-02-16 DIAGNOSIS — F1010 Alcohol abuse, uncomplicated: Secondary | ICD-10-CM

## 2017-02-16 DIAGNOSIS — K859 Acute pancreatitis without necrosis or infection, unspecified: Secondary | ICD-10-CM

## 2017-02-16 DIAGNOSIS — R69 Illness, unspecified: Secondary | ICD-10-CM | POA: Diagnosis not present

## 2017-02-16 LAB — POCT SKIN KOH: Skin KOH, POC: NEGATIVE

## 2017-02-16 MED ORDER — FLUCONAZOLE 40 MG/ML PO SUSR: 200.0000 mg | Freq: Every day | ORAL | 0 refills | Status: AC

## 2017-02-16 MED ORDER — PANCRELIPASE (LIP-PROT-AMYL) 36000-114000 UNITS PO CPEP: 36000.0000 [IU] | ORAL_CAPSULE | Freq: Three times a day (TID) | ORAL | 2 refills | Status: AC

## 2017-02-16 MED ORDER — METOCLOPRAMIDE HCL 5 MG PO TBDP: 5.0000 mg | ORAL_TABLET | Freq: Three times a day (TID) | ORAL | 1 refills | Status: DC

## 2017-02-16 NOTE — Progress Notes (Addendum)
Subjective:  By signing my name below, I, [name], attest that this documentation has been prepared under the direction and in the presence of [name], MD. Electronically Signed: [name], [place]. 02/16/2017 , 5:02 PM .  Patient was seen in Room 1 .   Patient ID: [name], female    DOB: 10/01/1960, 56 y.o.   MRN: [id number] Chief Complaint  Patient presents with  . food stuck in throat per patient   HPI [name] is a 56 y.o. female who presents to Primary Care at [facility] complaining of multiple concerns.   I last saw patient 6 months prior. Since that time, she has had 6 ER visits, 2 of which resulted in admission; all of which were related to ongoing alcohol abuse. Her last hospitalization was from 8/23 through 8/27 at [facility] in [facility]. She was drinking a pint of vodka a day, and also abusing benzo's with additional concerns of opioid prescription on admission; therefore, detox by voluntary admission. She did well with minimum symptoms and was eager to leave as her 78 year old daughter was alone at home without care. Her wellbutrin was stopped due to lower seizure threshold. Gabapentin 300mg  TID was started to help with any prolonged BZD WD symptoms as well as treating mild anxiety. She was continued on desipramine 25mg  QHS. Patient had an appointment at the [facility] on 8/30 with Silvestre Mesi for SA IOP and medication management.   Nausea/trouble swallowing Patient states she's been feeling nauseated and hasn't been able to eat. When she does try to force herself to eat, she would become nauseated again. Her dentures also weren't fitted properly and can't chew food properly. When she tries to eat solid food, her food wouldn't get processed properly and would get stuck in the back of her throat with the sensation of having phlegm in her throat as well. She's been taking her omeprazole QD in the morning. She tried drinking Ensure but would gag and  throw up. Today, for the first time and tried to drink Premium protein shake, and was able to finish the whole bottle for once; feeling better. She was able to eat yogurt as well. She's been having abdominal pain but improved today. Her weight is down to 106 lbs. She hasn't seen a speech pathologist.   Wt Readings from Last 3 Encounters:  02/16/17 106 lb (48.1 kg)  01/07/17 122 lb (55.3 kg)  11/29/16 131 lb (59.4 kg)   She saw Dr. Ardis Hughs about 6 weeks ago for yeast infections. She still has 4 more pills to take. Per note, she has hiatal hernia and stricture was dilated with moderate inflammation of her stomach.   She also mentions unable to take medications due to trouble swallowing. She hasn't been staying well hydrated either, so she hasn't been urinating normally. She rarely takes her nausea medication. She also has decreased bowel movements due to decreased PO intake.   Left ear fullness She also complains having sensation of fluid behind her left ear.    Driver's license - suspended Her driver's license was suspended because she was required to send her medical report into the DMV. This has been a setback as she states she requires driving for her daughter, and for her own medical visits. Her mother drove her today to the clinic.   Past Medical History:  Diagnosis Date  . Allergy    SEASONAL  . Anemia   . Arthritis    "  hips, shoulders, hands" (11/17/2016)  . ASD (atrial septal defect)    a. s/p ASD repair in 1973  . CAD (coronary artery disease)    a. 12/2015: NSTEMI occurring after cardiac arrest secondary to aspiration PNA. Echo w/ EF of 55-60%, no WMA. Outpt ischemic eval needed.  . Cardiomyopathy   . Chronic bronchitis ([place])   . Chronic lower back pain   . Deaf    "not totally deaf; read lips and can hear some" (11/17/2016)  . Depression   . Dysphagia    due to esophageal infections.  . Fibromyalgia   . Gastritis   . GERD (gastroesophageal reflux disease)   . Hearing  impairment   . High cholesterol   . History of hiatal hernia   . History of kidney stones   . Hypertension    [name], take htn medication to regulate heart beat.  . Meningitis spinal 1967  . Migraine    "all through my life" (11/17/2016)  . Osteoporosis   . Pneumonia    "several times" (11/17/2016)  . 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 Pulmonary embolism ([place]) 2003   occured post c-section of her daughter  . Sleep apnea   . Vitamin D deficiency    Prior to Admission medications   Medication Sig Start Date End Date Taking? Authorizing Provider  acetaminophen (TYLENOL) 325 MG tablet Take 2 tablets (650 mg total) by mouth every 6 (six) hours as needed for mild pain (or Fever >/= 101). Patient not taking: Reported on 01/07/2017 11/20/16   [name], MD  aspirin 81 MG chewable tablet Chew 81 mg by mouth daily.    [provider]  atorvastatin (LIPITOR) 40 MG tablet Take 1 tablet (40 mg total) by mouth daily at 6 PM. Patient not taking: Reported on 01/07/2017 02/02/16   [name], MD  chlordiazePOXIDE (LIBRIUM) 25 MG capsule 50mg  PO TID x 2D, then 25-50mg  PO BID X 2D, then 25-50mg  PO QD X 1D 11/29/16   Mesner, Corene Cornea, MD  Cholecalciferol (VITAMIN D3) 2000 units TABS Take 2,000 Units by mouth daily.     [provider]  fexofenadine (ALLEGRA ALLERGY) 180 MG tablet Take 180 mg by mouth daily.    [provider]  fluticasone (FLONASE) 50 MCG/ACT nasal spray Place 2 sprays into both nostrils daily.    [provider]  metoprolol tartrate (LOPRESSOR) 25 MG tablet Take 1 tablet (25 mg total) by mouth 2 (two) times daily. 02/01/17   [name], MD  nitroGLYCERIN (NITROSTAT) 0.4 MG SL tablet Place 1 tablet (0.4 mg total) under the tongue every 5 (five) minutes as needed for chest pain. Patient not taking: Reported on 01/07/2017 05/11/16   [name], MD  Omega-3 Fatty Acids (FISH OIL) 1000 MG CAPS Take 1,000 mg by mouth 2 (two) times daily.     [provider]  omeprazole (PRILOSEC) 40 MG capsule Take 1 capsule (40 mg total) by mouth daily. 01/07/17 02/06/17  Milus Banister, MD  ondansetron (ZOFRAN ODT) 4 MG disintegrating tablet Take 1 tablet (4 mg total) by mouth every 8 (eight) hours as needed for nausea. 11/08/15   [name], MD  pantoprazole (PROTONIX) 20 MG tablet Take 1 tablet (20 mg total) by mouth daily. 10/31/16   Charlesetta Shanks, MD  prazosin (MINIPRESS) 2 MG capsule Take 2 mg by mouth at  bedtime.  02/17/16   [provider]  PRESCRIPTION MEDICATION Inhale into the lungs at bedtime. CPAP    [provider]  Vilazodone HCl (VIIBRYD) 40 MG TABS Take 40 mg by mouth daily.    [provider]  vitamin B-12 (CYANOCOBALAMIN) 500 MCG tablet Take 500 mcg by mouth daily.    [provider]   Allergies  Allergen Reactions  . Depakote [Divalproex Sodium] Anaphylaxis  . Diazepam Other (See Comments)    Died on operating table. TOLERATES ATIVAN AND LIBRIUM W/O DIFFICULTY  . Valium Anaphylaxis    Tolerates Ativan and Librium w/o complication  . Tizanidine Hcl Other (See Comments)    "passed out"   Past Surgical History:  Procedure Laterality Date  . APPENDECTOMY    . ATRIAL SEPTAL DEFECT(ASD) CLOSURE  1973  . [place]  . CESAREAN SECTION  2003  . COCHLEAR IMPLANT Right   . ESOPHAGOGASTRODUODENOSCOPY (EGD) WITH PROPOFOL N/A 07/20/2016   Procedure: ESOPHAGOGASTRODUODENOSCOPY (EGD) WITH PROPOFOL;  Surgeon: Gatha Mayer, MD;  Location: [place];  Service: Endoscopy;  Laterality: N/A;  . EYE MUSCLE SURGERY Bilateral   . INCISION AND DRAINAGE OF WOUND  1993   S/P c-section  . LAPAROSCOPIC CHOLECYSTECTOMY  2012  . MITRAL VALVE REPAIR  05/2005  . TONSILLECTOMY AND ADENOIDECTOMY    . TUBAL LIGATION  1993  . TYMPANOSTOMY TUBE PLACEMENT Bilateral "lots"   Social  History   Social History  . Marital status: Divorced    Spouse name: N/A  . Number of children: N/A  . Years of education: N/A   Social History Main Topics  . Smoking status: Never Smoker  . Smokeless tobacco: Never Used  . 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 Drug use: Yes    Types: Marijuana     Comment: 11/17/2016 "tried marijuana a few times in college"  . Sexual activity: Not Currently    Birth control/ protection: Surgical     Comment: 1st intercourse- 19, partners- 6   Other Topics Concern  . None   Social History Narrative   Lives with 16 year old daughter.       Family History  Problem Relation Age of Onset  . Breast cancer Mother        bilateral; ages 26 and 82; TAH/BSO ~50  . Depression Sister   . Heart disease Father   . Hypertension Father   . Heart attack Father   . Colon cancer Neg Hx   . Esophageal cancer Neg Hx   . Pancreatic cancer Neg Hx   . Rectal cancer Neg Hx   . Stomach cancer Neg Hx    Depression screen [facility] 2/9 02/16/2017 09/28/2016 08/13/2016 07/22/2016 05/11/2016  Decreased Interest 0 1 0 0 0  Down, Depressed, Hopeless 0 1 0 1 0  PHQ - 2 Score 0 2 0 1 0  Altered sleeping - 1 - - -  Tired, decreased energy - 1 - - -  Change in appetite - 1 - - -  Feeling bad or failure about yourself  - 1 - - -  Trouble concentrating - 1 - - -  Moving slowly or fidgety/restless - 1 - - -  Suicidal thoughts - 1 - - -  PHQ-9 Score - 9 - - -  Difficult doing work/chores - Very difficult - - -  Some recent  data might be hidden    Review of Systems  Constitutional: Positive for appetite change, fatigue and unexpected weight change. Negative for chills and fever.  HENT: Positive for dental problem and trouble swallowing.   Respiratory: Negative for cough.   Gastrointestinal: Positive for abdominal pain and nausea. Negative for constipation, diarrhea and vomiting.    Skin: Negative for rash and wound.  Neurological: Negative for dizziness, weakness and headaches.       Objective:   Physical Exam  Constitutional: She is oriented to person, place, and time. She appears well-developed and well-nourished. No distress.  HENT:  Head: Normocephalic and atraumatic.  Right Ear: Tympanic membrane is retracted. A middle ear effusion is present.  Left Ear: Tympanic membrane is retracted. A middle ear effusion is present.  Mouth/Throat: Oropharynx is clear and moist.  Tongue with white adherent plaque  Eyes: Pupils are equal, round, and reactive to light. EOM are normal.  Neck: Neck supple.  Cardiovascular: Normal rate.   Pulmonary/Chest: Effort normal. No respiratory distress.  Musculoskeletal: Normal range of motion.  Neurological: She is alert and oriented to person, place, and time.  Skin: Skin is warm and dry.  Psychiatric: She has a normal mood and affect. Her behavior is normal.  Nursing note and vitals reviewed.   BP 122/72   Pulse 94   Temp 98.7 F (37.1 C) (Oral)   Resp 17   Ht 5\' 3"  (1.6 m)   Wt 106 lb (48.1 kg)   SpO2 98%   BMI 18.78 kg/m   Results for orders placed or performed in visit on 02/16/17  POCT Skin KOH  Result Value Ref Range   Skin KOH, POC Negative Negative       Assessment & Plan:   1. Loss of weight   2. Esophageal dysphagia   3. Recurrent pancreatitis ([place])   4. Oropharyngeal dysphagia   5. Alcohol abuse   6. Dehydration    Will extend trx for esophageal candidiasis and change to liquid. C/o early satiety w/ pain and nausea - try reglan and creon before every meal and bed scheduled.  Rec GI eval - seen by Dr. Ardis Hughs at [facility]. F/u in 2d for recheck.  Orders Placed This Encounter  Procedures  . Calprotectin, Fecal  . Comprehensive metabolic panel  . Lipase  . Vitamin B12  . Folate  . Vitamin B1  . Thyroid Panel With TSH  . Comprehensive metabolic panel  . Lipase  . Ambulatory referral to  Gastroenterology    Referral Priority:   Routine    Referral Type:   Consultation    Referral Reason:   Specialty Services Required    Referred to Provider:   Milus Banister, MD    Number of Visits Requested:   1  . Orthostatic vital signs  . SLP eval and treat    Standing Status:   Future    Standing Expiration Date:   02/16/2018  . POCT CBC  . POCT Skin KOH    Meds ordered this encounter  Medications  . fluconazole (DIFLUCAN) 40 MG/ML suspension    Sig: Take 5 mLs (200 mg total) by mouth daily.    Dispense:  70 mL    Refill:  0  . lipase/protease/amylase (CREON) 36000 UNITS CPEP capsule    Sig: Take 1 capsule (36,000 Units total) by mouth 3 (three) times daily before meals.    Dispense:  90 capsule    Refill:  2  . Metoclopramide HCl  5 MG TBDP    Sig: Take 1 tablet (5 mg total) by mouth 4 (four) times daily -  before meals and at bedtime.    Dispense:  120 tablet    Refill:  1   Over 40 min spent in face-to-face evaluation of and consultation with patient and coordination of care.  Over 50% of this time was spent counseling this patient regarding need for med compliance, poss of chronic pancreatitis, need for repeat swallow study, need to prioritize adequate nutritional intake, how to stay on thickened liquids diet.  I personally performed the services described in this documentation, which was scribed in my presence. The recorded information has been reviewed and considered, and addended by me as needed.   [name], M.D.  Primary Care at [facility] [address] [place], [place] [address] [phone] phone [phone] fax  02/18/17 2:59 PM

## 2017-02-16 NOTE — Patient Instructions (Addendum)
IF you received an x-ray today, you will receive an invoice from [facility]. Please contact [facility] at [phone] with questions or concerns regarding your invoice.   IF you received labwork today, you will receive an invoice from [facility]. Please contact [facility] at [phone] with questions or concerns regarding your invoice.   Our billing staff will not be able to assist you with questions regarding bills from these companies.  You will be contacted with the lab results as soon as they are available. The fastest way to get your results is to activate your My Chart account. Instructions are located on the last page of this paperwork. If you have not heard from [place] regarding the results in 2 weeks, please contact this office.     Thickening Liquids for Dysphagia Diet If you are on the dysphagia diet, you may need to thicken drinks, soups, foods that melt at room temperature, and other liquids before you drink or eat them. Thickening liquids makes them easier to swallow. It also reduces the risk of liquid traveling to your lungs. To make a thickened liquid you will need to add a commercial thickening product or a soft food to the liquid until it reaches the consistency it needs to be. Your health care provider or dietitian will explain to you the consistency you need to aim for. Liquid consistencies include:  Thin. Thin liquids include most drinks (such as water, milk, tea, soda, juice, carbonated drinks), as well as ice cream, sherbet, sorbet, ice pops, and broth-based soups.  Nectar-like. Nectar-like liquids include maple syrup and creamy soup.  Honey-like. Honey-like liquids are made to be runny but are thick like honey. They cannot be sipped through a straw.  Spoon-thick. Spoon-thick liquids are thick, like pudding.  My plan I should thicken my liquids to a _______________ consistency. Diet guidelines  Thicken liquids to the consistency your health care  provider recommends.  Follow your dietitian's or health care provider's recommendation on how to thicken your liquids.  See your dietitian or health care provider regularly for help with your dietary changes. How can I thicken my liquids? Liquids can be thickened with a commercial food and beverage thickener or with a soft food. Equ[employer] trader Thickeners A food and beverage thickener is a powder or gel that makes a food or beverage thicker. Thickeners are sold at pharmacies, medical supply stores, some grocery stores, and online. They can be added to both hot and cold liquids and do not change the taste of the liquid. Ask your health care provider or dietitian for a complete list of commercial thickeners. Each thickening product is different. Some need to be blended into a liquid with a blender while others can be stirred into a liquid with a fork or spoon. Follow the instructions on the product label. Soft Foods Some foods such as soups, casseroles, and gravies can be thickened with soft foods. Soft foods include:  Baby cereal.  Gravy powder.  Mashed potato.  Pureed baby food.  Instant potato flakes.  Powdered sauce mixes (such as cheese mixes).  Flour.  To use one of these soft food items, stir or mix them into the thin liquid until it reaches the desired thickness. Start with a small amount and adjust soft food and liquid as necessary. Note: Flour works best with warm liquids, such as broth. To thicken a liquid with flour, make a paste out of flour and water. Cook or warm your liquid and add the  paste to it. Stir until the mixture thickens. What are some tips to make thickening liquids easier?  Take thickeners with you when eating out or traveling.  If a liquid gets too thick, add more of the thinner liquid until the desired consistency is reached.  Consider purchasing pre-made thickened drinks.  Consider using a thickening product to make your own frozen  desserts. This information is not intended to replace advice given to you by your health care provider. Make sure you discuss any questions you have with your health care provider. Document Released: 10/26/2011 Document Revised: 10/02/2015 Document Reviewed: 04/09/2013 Elsevier Interactive Patient Education  2017 Elsevier Inc. Dysphagia Diet Level 1, Pureed The dysphasia level 1 diet includes foods that are completely pureed and smooth. The foods have a pudding-like texture, such as the texture of pureed pancakes, mashed potatoes, and yogurt. The diet does not include foods with lumps or coarse textures. Liquids should be smooth and may either be thin, nectar-thick, honey-like, or spoon-thick. This diet is helpful for people with moderate to severe swallowing problems. It reduces the risk of food getting caught in the windpipe, trachea, or lungs. You may need help or supervision during meals while following this diet. What do I need to know about this diet? Foods  You may eat foods that are soft and have a pudding-like texture. If a food does not have this texture, you may be able to eat the food after: ? Pureeing it. This can be done with a blender or whisk. ? Moistening it with liquid. For example, you may have bread if you soak it in milk or syrup.  Avoid foods that are hard, dry, sticky, chunky, lumpy, or stringy. Also avoid foods with nuts, seeds, raisins, skins, and pulp.  Do not eat foods that you have to chew. If you have to chew the food, then you cannot eat it.  Eat a variety of foods to get all the nutrients you need. Liquids  You may drink liquids that are smooth. Your health care provider will tell you if you should drink thin or thickened liquids.  To thicken a liquid, use a food and beverage thickener or a thickening food. Thickened liquids are usually a "pudding-like" consistency.  Thin liquids include fruit juices, milk, coffee, tea, yogurts, shakes, and similar foods that  melt to thin liquid at room temperature.  Avoid liquids with seeds, pulp, or chunks. See your dietitian or health care provider regularly for help with your dietary changes. What foods can I eat? Grains Store-bought soft breads, pancakes, and [place] toast that have a smooth, moist texture and do not have nuts or seeds (you will need to moisten the food with liquid). Cooked cereals that have a pudding-like consistency, such as cream of wheat or farina (no oatmeal). Pureed, well-cooked pasta, rice, and plain bread stuffing. Vegetables Pureed vegetables. Soft avocado. Smooth tomato paste or sauce. Strained or pureed soups (these may need to be thickened as directed). Mashed or pureed potatoes without skin (can be seasoned with butter, smooth gravy, margarine, or sour cream). Fruits Pureed fruits such as melons and apples without seeds or pulp. Mashed bananas. Smooth tomato paste or sauce. Fruit juices without pulp or seeds. Strained or pureed soups. Meat and Other Protein Sources Pureed meat. Smooth pate or liverwurst. Smooth souffles. Pureed beans (such as lentils). Pureed eggs. Dairy Yogurt. Smooth cheese sauces. Milk (may need to be thickened). Nutritional dairy drinks or shakes. Ask your health care provider whether you can have ice  cream. Condiments Finely ground salt, pepper, and other ground spices. Sweets/Desserts Smooth puddings and custards. Pureed desserts. Souffles. Whipped topping. Ask your health care provider whether you can have frozen desserts. Fats and Oils Butter. Margarine. Smooth and strained gravy. Sour cream. Mayonnaise. Cream cheese. Whipped topping. Smooth sauces (such as white sauce, cheese sauce, or hollandaise sauce). The items listed above may not be a complete list of recommended foods or beverages. Contact your dietitian for more options. What foods are not recommended? Grains Oatmeal. Dry cereals. Hard breads. Vegetables Whole vegetables. Stringy vegetables  (such as celery). Thin tomato sauce. Fruits Whole fresh, frozen, canned, or dried fruits that have not been pureed. Stringy fruits (such as pineapple). Meat and Other Protein Sources Whole or ground meat, fish, or poultry. Dried or cooked lentils or legumes that have been cooked but not mashed or pureed. Non-pureed eggs. Nuts and seeds. Peanut butter. Dairy Non-pureed cheese. Dairy products with lumps or chunks. Ask your health care provider whether you can have ice cream. Condiments Coarse or seeded herbs and spices. Sweets/Desserts [place] preserves. Jams with seeds. Solid desserts. Sticky, chewy sweets (such as licorice and caramel). Ask your health care provider whether you can have frozen desserts. Fats and Oils Sauces of fats with lumps or chunks. The items listed above may not be a complete list of foods and beverages to avoid. Contact your dietitian for more information. This information is not intended to replace advice given to you by your health care provider. Make sure you discuss any questions you have with your health care provider. Document Released: 04/26/2005 Document Revised: 10/02/2015 Document Reviewed: 04/09/2013 Elsevier Interactive Patient Education  2017 [employer].

## 2017-02-17 ENCOUNTER — Telehealth: Payer: Self-pay | Admitting: Family Medicine

## 2017-02-17 DIAGNOSIS — R69 Illness, unspecified: Secondary | ICD-10-CM | POA: Diagnosis not present

## 2017-02-17 NOTE — Telephone Encounter (Signed)
Pharmacy is calling to see if the reglan rx can be changed from a desolve to regular where they could crush and take with apple sauce due to the fact that no [facility] has the rx  Best number [phone]

## 2017-02-18 ENCOUNTER — Encounter: Payer: Self-pay | Admitting: Family Medicine

## 2017-02-18 ENCOUNTER — Ambulatory Visit (INDEPENDENT_AMBULATORY_CARE_PROVIDER_SITE_OTHER): Payer: Medicare HMO | Admitting: Family Medicine

## 2017-02-18 ENCOUNTER — Telehealth: Payer: Self-pay | Admitting: Family Medicine

## 2017-02-18 VITALS — BP 118/72 | HR 78 | Temp 98.8°F | Resp 16 | Ht 63.78 in | Wt 108.0 lb

## 2017-02-18 DIAGNOSIS — E876 Hypokalemia: Secondary | ICD-10-CM

## 2017-02-18 DIAGNOSIS — R69 Illness, unspecified: Secondary | ICD-10-CM | POA: Diagnosis not present

## 2017-02-18 DIAGNOSIS — M4850XA Collapsed vertebra, not elsewhere classified, site unspecified, initial encounter for fracture: Secondary | ICD-10-CM | POA: Diagnosis not present

## 2017-02-18 DIAGNOSIS — K860 Alcohol-induced chronic pancreatitis: Secondary | ICD-10-CM | POA: Diagnosis not present

## 2017-02-18 DIAGNOSIS — F10929 Alcohol use, unspecified with intoxication, unspecified: Principal | ICD-10-CM

## 2017-02-18 LAB — POCT CBC
Granulocyte percent: 56.5 %G (ref 37–80)
HCT, POC: 33.9 % — AB (ref 37.7–47.9)
Hemoglobin: 11.5 g/dL — AB (ref 12.2–16.2)
Lymph, poc: 0.7 (ref 0.6–3.4)
MCH, POC: 31.6 pg — AB (ref 27–31.2)
MCHC: 34.1 g/dL (ref 31.8–35.4)
MCV: 92.9 fL (ref 80–97)
MID (cbc): 0.3 (ref 0–0.9)
MPV: 8.3 fL (ref 0–99.8)
POC Granulocyte: 1.4 — AB (ref 2–6.9)
POC LYMPH PERCENT: 29.7 %L (ref 10–50)
POC MID %: 13.8 %M — AB (ref 0–12)
Platelet Count, POC: 210 10*3/uL (ref 142–424)
RBC: 3.65 M/uL — AB (ref 4.04–5.48)
RDW, POC: 16.7 %
WBC: 2.4 10*3/uL — AB (ref 4.6–10.2)

## 2017-02-18 NOTE — Patient Instructions (Signed)
     IF you received an x-ray today, you will receive an invoice from [facility]. Please contact [facility] at [phone] with questions or concerns regarding your invoice.   IF you received labwork today, you will receive an invoice from [facility]. Please contact [facility] at [phone] with questions or concerns regarding your invoice.   Our billing staff will not be able to assist you with questions regarding bills from these companies.  You will be contacted with the lab results as soon as they are available. The fastest way to get your results is to activate your My Chart account. Instructions are located on the last page of this paperwork. If you have not heard from us regarding the results in 2 weeks, please contact this office.     

## 2017-02-18 NOTE — Telephone Encounter (Signed)
Dr. Brigitte Pulse wanted to call the patient to ask if they had their DMV forms as we have not received them and they are critical to the visit scheduled at 3pm. If the patient does not have the forms we need to reschedule the visit.

## 2017-02-18 NOTE — Telephone Encounter (Signed)
Verbal order given to pharmacy.
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
## 2017-02-19 ENCOUNTER — Encounter: Payer: Self-pay | Admitting: Family Medicine

## 2017-02-20 LAB — THYROID PANEL WITH TSH
FREE THYROXINE INDEX: 1.4 (ref 1.2–4.9)
T3 UPTAKE RATIO: 20 % — AB (ref 24–39)
T4 TOTAL: 6.8 ug/dL (ref 4.5–12.0)
TSH: 4.13 u[IU]/mL (ref 0.450–4.500)

## 2017-02-20 LAB — LIPASE: Lipase: 82 U/L — ABNORMAL HIGH (ref 14–72)

## 2017-02-20 LAB — COMPREHENSIVE METABOLIC PANEL
ALBUMIN: 3.9 g/dL (ref 3.5–5.5)
ALK PHOS: 164 IU/L — AB (ref 39–117)
ALT: 14 IU/L (ref 0–32)
AST: 43 IU/L — AB (ref 0–40)
Albumin/Globulin Ratio: 1.4 (ref 1.2–2.2)
BILIRUBIN TOTAL: 0.6 mg/dL (ref 0.0–1.2)
BUN / CREAT RATIO: 26 — AB (ref 9–23)
BUN: 17 mg/dL (ref 6–24)
CO2: 21 mmol/L (ref 20–29)
Calcium: 9 mg/dL (ref 8.7–10.2)
Chloride: 98 mmol/L (ref 96–106)
Creatinine, Ser: 0.65 mg/dL (ref 0.57–1.00)
GFR calc Af Amer: 115 mL/min/{1.73_m2} (ref >59)
GFR calc non Af Amer: 100 mL/min/{1.73_m2} (ref >59)
GLUCOSE: 94 mg/dL (ref 65–99)
Globulin, Total: 2.7 g/dL (ref 1.5–4.5)
Potassium: 3 mmol/L — ABNORMAL LOW (ref 3.5–5.2)
Sodium: 140 mmol/L (ref 134–144)
Total Protein: 6.6 g/dL (ref 6.0–8.5)

## 2017-02-20 LAB — FOLATE: Folate: 6.4 ng/mL (ref >3.0)

## 2017-02-20 LAB — VITAMIN B1

## 2017-02-20 LAB — VITAMIN B12: VITAMIN B 12: 776 pg/mL (ref 232–1245)

## 2017-02-21 LAB — COMPREHENSIVE METABOLIC PANEL
A/G RATIO: 1.3 (ref 1.2–2.2)
ALBUMIN: 3.7 g/dL (ref 3.5–5.5)
ALT: 16 IU/L (ref 0–32)
AST: 50 IU/L — ABNORMAL HIGH (ref 0–40)
Alkaline Phosphatase: 121 IU/L — ABNORMAL HIGH (ref 39–117)
BILIRUBIN TOTAL: 0.4 mg/dL (ref 0.0–1.2)
BUN / CREAT RATIO: 42 — AB (ref 9–23)
BUN: 25 mg/dL — ABNORMAL HIGH (ref 6–24)
CHLORIDE: 96 mmol/L (ref 96–106)
CO2: 21 mmol/L (ref 20–29)
Calcium: 9.3 mg/dL (ref 8.7–10.2)
Creatinine, Ser: 0.59 mg/dL (ref 0.57–1.00)
GFR calc non Af Amer: 103 mL/min/{1.73_m2} (ref >59)
GFR, EST AFRICAN AMERICAN: 118 mL/min/{1.73_m2} (ref >59)
Globulin, Total: 2.8 g/dL (ref 1.5–4.5)
Glucose: 71 mg/dL (ref 65–99)
POTASSIUM: 2.8 mmol/L — AB (ref 3.5–5.2)
Sodium: 139 mmol/L (ref 134–144)
TOTAL PROTEIN: 6.5 g/dL (ref 6.0–8.5)

## 2017-02-21 LAB — PHOSPHORUS: Phosphorus: 3.2 mg/dL (ref 2.5–4.5)

## 2017-02-21 LAB — VITAMIN B1

## 2017-02-21 LAB — ETHANOL: Ethanol: NEGATIVE %

## 2017-02-21 LAB — MAGNESIUM: Magnesium: 1 mg/dL — ABNORMAL LOW (ref 1.6–2.3)

## 2017-02-21 LAB — LIPASE: Lipase: 114 U/L — ABNORMAL HIGH (ref 14–72)

## 2017-02-21 NOTE — Addendum Note (Signed)
Addended by: Gari Crown D on: 02/21/2017 04:10 PM   Modules accepted: Orders

## 2017-02-22 ENCOUNTER — Ambulatory Visit: Payer: Medicare HMO | Admitting: Emergency Medicine

## 2017-02-22 DIAGNOSIS — K861 Other chronic pancreatitis: Secondary | ICD-10-CM | POA: Diagnosis not present

## 2017-02-22 DIAGNOSIS — R69 Illness, unspecified: Secondary | ICD-10-CM | POA: Diagnosis not present

## 2017-02-22 DIAGNOSIS — F1010 Alcohol abuse, uncomplicated: Secondary | ICD-10-CM

## 2017-02-22 DIAGNOSIS — K859 Acute pancreatitis without necrosis or infection, unspecified: Secondary | ICD-10-CM

## 2017-02-23 ENCOUNTER — Telehealth: Payer: Self-pay | Admitting: Family Medicine

## 2017-02-23 NOTE — Telephone Encounter (Signed)
Pt. Mother called to let us know that the [facility] did not receive the forms that are needed. She is asking that we re-fax the forms to a different number .   New fax number: [phone]  Please call Mother Quincy Carnes at [phone] when the forms have been faxed   Quincy Carnes is on the [facility] - PCP 2018

## 2017-02-24 DIAGNOSIS — R69 Illness, unspecified: Secondary | ICD-10-CM | POA: Diagnosis not present

## 2017-02-25 LAB — VITAMIN B1: THIAMINE: 90.4 nmol/L (ref 66.5–200.0)

## 2017-02-27 ENCOUNTER — Other Ambulatory Visit: Payer: Self-pay | Admitting: Family Medicine

## 2017-02-28 MED ORDER — MAGNESIUM CHLORIDE 64 MG PO TBEC: 2.0000 | DELAYED_RELEASE_TABLET | Freq: Two times a day (BID) | ORAL | 0 refills | Status: AC

## 2017-02-28 MED ORDER — POTASSIUM CHLORIDE 20 MEQ PO PACK: 20.0000 meq | PACK | Freq: Every day | ORAL | 0 refills | Status: DC

## 2017-02-28 NOTE — Telephone Encounter (Signed)
Forms were completed during visit on 10/12 - before I left that night, I placed them either in the FMLA/Disability file or the "TO FAX" wall file near the copier in 102 front office.

## 2017-02-28 NOTE — Telephone Encounter (Signed)
Do you know if this has been resolved? If so, please complete message.

## 2017-02-28 NOTE — Addendum Note (Signed)
Addended by: [name] on: 02/28/2017 05:19 PM   Modules accepted: Orders

## 2017-03-02 LAB — CALPROTECTIN, FECAL

## 2017-03-03 ENCOUNTER — Encounter: Payer: Self-pay | Admitting: Radiology

## 2017-03-03 DIAGNOSIS — R69 Illness, unspecified: Secondary | ICD-10-CM | POA: Diagnosis not present

## 2017-03-04 ENCOUNTER — Encounter: Payer: Self-pay | Admitting: Physician Assistant

## 2017-03-04 ENCOUNTER — Ambulatory Visit (INDEPENDENT_AMBULATORY_CARE_PROVIDER_SITE_OTHER): Payer: Medicare HMO | Admitting: Physician Assistant

## 2017-03-04 VITALS — BP 108/62 | HR 88 | Temp 98.7°F | Resp 18 | Ht 64.84 in | Wt 111.0 lb

## 2017-03-04 DIAGNOSIS — E876 Hypokalemia: Secondary | ICD-10-CM | POA: Diagnosis not present

## 2017-03-04 DIAGNOSIS — K861 Other chronic pancreatitis: Secondary | ICD-10-CM | POA: Diagnosis not present

## 2017-03-04 DIAGNOSIS — R748 Abnormal levels of other serum enzymes: Secondary | ICD-10-CM | POA: Diagnosis not present

## 2017-03-04 NOTE — Patient Instructions (Addendum)
I am glad to see that you are doing much better. We have obtained labs today and will contact you next week with these results. In the meantime, please pick up oral potassium, magnesium, and prenatal multivitamin. Follow up with Dr. Brigitte Pulse as planned next week. Return sooner if you get any sudden onset abdominal pain, nausea, vomiting, muscle weakness, chest pain, or heart palpitations. Thank you for letting me participate in your health and well being.   Dr. Raul Del message to you: "Please call pt - she needs to be seen by one of my colleagues in f/u [place] for repeat eval and labs this week. Magnesium and potassium levels are much to low and pancreas is getting progressively irritated. She needs to stay on a NO FAT diet, continue taking the creon prior to eating/drinking and come in for recheck asap. Purchase an otc sustained release magnesium chloride containing supplement (such as Mag Delay or Slow-Mag) and start taking 2 tabs twice a day - if it is causing diarrhea, she can decrease it to 2 tabs at night only or 1 tab twice a day. I have also sent a potassium supplement to her pharmacy for her to start taking once daily. If she is having abdominal pain, vomiting, or sig muscle stiffness/weakness - needs to go to the ER. Her vitamin B1 level was normal but folate (vitamin B9) is on the low side so she needs to start taking a PRENATAL mulitivitamin daily which will have about 881 mcg of folic acid in it   IF you received an x-ray today, you will receive an invoice from [facility]. Please contact [facility] at [phone] with questions or concerns regarding your invoice.   IF you received labwork today, you will receive an invoice from [facility]. Please contact [facility] at [phone] with questions or concerns regarding your invoice.   Our billing staff will not be able to assist you with questions regarding bills from these companies.  You will be contacted with the lab results as  soon as they are available. The fastest way to get your results is to activate your My Chart account. Instructions are located on the last page of this paperwork. If you have not heard from [place] regarding the results in 2 weeks, please contact this office.

## 2017-03-04 NOTE — Progress Notes (Signed)
[name]  MRN: [id number] DOB: Nov 15, 1960  Subjective:  [name] is a 56 y.o. female seen in office today for a chief complaint of follow up on blood work. Pt seen by PCP, Dr. Brigitte Pulse, on 02/18/2017 for weight loss.  Please refer to that office note for further details.  Labs of concern during that visit were lipase of 114, potassium of 2.8, and magnesium of 1.0.  Dr. Brigitte Pulse had lab call patient and have patient pick up over-the-counter magnesium supplement, over-the-counter prenatal vitamin, and prescribed oral potassium.  She also recommended the patient return to clinic ASAP for follow-up.  Today patient notes she is doing much better.  She has gained 3 pounds since her last   Visit.  Notes taking the Creon and metoclopramide  have helped her be able to swallow.  She has been eating the protein shakes, yogurt, chicken, and soups.  She denies abdominal pain, vomiting, nausea, chest pain, heart palpitations, and muscle soreness.  She has not picked up the magnesium, prenatal vitamin, and oral potassium because she just got notified yesterday that she needed to pick this up.  Has no other questions or complaints today.  The  Review of Systems  Constitutional: Negative for chills, diaphoresis and fever.  Gastrointestinal: Negative for constipation and diarrhea.  Neurological: Negative for dizziness, tremors, weakness, light-headedness and numbness.    Patient Active Problem List   Diagnosis Date Noted  . Chronic pancreatitis due to acute alcohol intoxication ([place]) 11/17/2016  . Pancytopenia ([place])   . Candida infection, esophageal ([place])   . Esophageal ring, acquired   . Neurocardiogenic syncope 07/19/2016  . Leukocytopenia 07/19/2016  . Macrocytosis 07/19/2016  . Abdominal pain, epigastric   . Bradycardia   . Blepharitis of both eyes 02/03/2016  . Cardiac arrest ([place]) 12/22/2015  . Uncontrolled hypertension   . Aspiration pneumonia of right lower lobe due to gastric secretions ([place])     . Hypokalemia   . Pneumonia   . NSTEMI (non-ST elevated myocardial infarction) ([place]) 12/19/2015  . Typical atrial flutter ([place])   . Pressure ulcer 12/18/2015  . Protein-calorie malnutrition, severe 12/18/2015  . Metabolic acidosis 09/62/8366  . Lactic acidosis 12/17/2015  . Leucocytosis 12/17/2015  . Dehydration 12/17/2015  . Starvation ketoacidosis 12/17/2015  . Alcohol abuse 12/17/2015  . Increased anion gap metabolic acidosis 29/47/6546  . Essential hypertension 12/06/2015  . Accidental drug overdose 10/26/2015  . Aspiration pneumonia ([place]) 10/23/2015  . Acute respiratory failure with hypoxia ([place]) 10/23/2015  . Acute kidney injury ([place]) 10/23/2015  . Transaminitis 10/23/2015  . Positive blood culture 10/23/2015  . Elevated troponin 10/23/2015  . Thrombocytopenia ([place]) 10/23/2015  . Normocytic anemia 10/23/2015  . Shock ([place]) 10/19/2015  . Major depressive disorder, recurrent episode, moderate ([place]) 09/18/2014  . Non-traumatic compression fracture of vertebral column with routine healing 07/17/2014  . Osteoporosis 07/17/2014  . H/O vitamin D deficiency 07/17/2014  . Odynophagia 07/09/2014  . Esophageal dysphagia 07/08/2014  . Chest pain 05/20/2014  . Nausea & vomiting 05/20/2014  . 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 Major depression, chronic ([place]) 03/29/2014  . Breath shortness 03/29/2014  . Hypercholesterolemia without hypertriglyceridemia 03/29/2014  . Fatigue 03/29/2014  . Classical migraine with intractable migraine 03/29/2014  . Disorder of mitral valve 03/29/2014  . Atypical migraine 03/29/2014  . Atrial septal defect of fossa ovalis 03/29/2014  . HLD (hyperlipidemia) 03/29/2014  . Arthralgia of multiple joints 03/29/2014  . Anemia, iron deficiency 03/29/2014  . Fibrositis  03/29/2014  . Abdominal pain 12/20/2013  . Absolute anemia 09/20/2013  . Bronchitis 09/20/2013  . Vaginitis and vulvovaginitis 09/20/2013  .  Family history of breast cancer 07/13/2013  . Palpitation 07/02/2013  . History of mitral valve repair 07/02/2013  . Status post patch closure of ASD 07/02/2013  . Hyperlipidemia 07/02/2013  . OAB (overactive bladder) 06/13/2013  . Ovarian cyst, left 03/15/2013  . Vaginal lesion 03/15/2013  . Ovarian mass 03/15/2013  . Maternal DVT (deep vein thrombosis), history of 02/26/2013  . PMB (postmenopausal bleeding) 02/26/2013  . Alcohol dependence ([place]) 11/07/2012  . Unspecified vitamin D deficiency 08/17/2012  . L1 vertebral fracture ([place]) 08/01/2012  . Spinal compression fracture ([place]) 07/12/2012  . Menopausal state 07/12/2012  . Psoriasis 07/12/2012  . Severe episode of recurrent major depressive disorder ([place]) 12/07/2011  . Cardiomyopathy 03/16/2011  . Hearing impairment   . Migraine   . Arthritis     Current Outpatient Prescriptions on File Prior to Visit  Medication Sig Dispense Refill  . acetaminophen (TYLENOL) 325 MG tablet Take 2 tablets (650 mg total) by mouth every 6 (six) hours as needed for mild pain (or Fever >/= 101).    [name] aspirin 81 MG chewable tablet Chew 81 mg by mouth daily.    [name] atorvastatin (LIPITOR) 40 MG tablet Take 1 tablet (40 mg total) by mouth daily at 6 PM. 90 tablet 1  . Cholecalciferol (VITAMIN D3) 2000 units TABS Take 2,000 Units by mouth daily.     . fexofenadine (ALLEGRA ALLERGY) 180 MG tablet Take 180 mg by mouth daily.    . fluticasone (FLONASE) 50 MCG/ACT nasal spray Place 2 sprays into both nostrils daily.    . lipase/protease/amylase (CREON) 36000 UNITS CPEP capsule Take 1 capsule (36,000 Units total) by mouth 3 (three) times daily before meals. 90 capsule 2  . magnesium chloride (SLOW-MAG) 64 MG TBEC SR tablet Take 2 tablets (128 mg total) by mouth 2 (two) times daily. 40 tablet 0  . Metoclopramide HCl 5 MG TBDP Take 1 tablet (5 mg total) by mouth 4 (four) times daily -  before meals and at bedtime. 120 tablet 1  . metoprolol tartrate (LOPRESSOR) 25  MG tablet Take 1 tablet (25 mg total) by mouth 2 (two) times daily. 60 tablet 0  . nitroGLYCERIN (NITROSTAT) 0.4 MG SL tablet Place 1 tablet (0.4 mg total) under the tongue every 5 (five) minutes as needed for chest pain. 30 tablet 0  . Omega-3 Fatty Acids (FISH OIL) 1000 MG CAPS Take 1,000 mg by mouth 2 (two) times daily.     . ondansetron (ZOFRAN ODT) 4 MG disintegrating tablet Take 1 tablet (4 mg total) by mouth every 8 (eight) hours as needed for nausea. 6 tablet 0  . potassium chloride (KLOR-CON) 20 MEQ packet Take 20 mEq by mouth daily. 7 packet 0  . prazosin (MINIPRESS) 2 MG capsule Take 2 mg by mouth at bedtime.     [name] PRESCRIPTION MEDICATION Inhale into the lungs at bedtime. CPAP    . Vilazodone HCl (VIIBRYD) 40 MG TABS Take 40 mg by mouth daily.    . vitamin B-12 (CYANOCOBALAMIN) 500 MCG tablet Take 500 mcg by mouth daily.    [name] omeprazole (PRILOSEC) 40 MG capsule Take 1 capsule (40 mg total) by mouth daily. 30 capsule 11   Current Facility-Administered Medications on File Prior to Visit  Medication Dose Route Frequency Provider Last Rate Last Dose  . 0.9 %  sodium chloride infusion  500  mL Intravenous Continuous Milus Banister, MD        Allergies  Allergen Reactions  . Depakote [Divalproex Sodium] Anaphylaxis  . Diazepam Other (See Comments)    Died on operating table. TOLERATES ATIVAN AND LIBRIUM W/O DIFFICULTY  . Valium Anaphylaxis    Tolerates Ativan and Librium w/o complication  . Tizanidine Hcl Other (See Comments)    "passed out"     Objective:  BP 108/62 (BP Location: Left Arm, Patient Position: Sitting, Cuff Size: Normal)   Pulse 88   Temp 98.7 F (37.1 C) (Oral)   Resp 18   Ht 5' 4.84" (1.647 m)   Wt 111 lb (50.3 kg)   SpO2 99%   BMI 18.56 kg/m   Physical Exam  Constitutional: She is oriented to person, place, and time and well-developed, well-nourished, and in no distress. No distress.  HENT:  Head: Normocephalic and atraumatic.  Mouth/Throat: Uvula  is midline, oropharynx is clear and moist and mucous membranes are normal. She has dentures.  Eyes: Conjunctivae and EOM are normal.  Neck: Normal range of motion.  Cardiovascular: Normal rate, regular rhythm, normal heart sounds and intact distal pulses.   Pulmonary/Chest: Effort normal and breath sounds normal. She has no wheezes. She has no rhonchi. She has no rales.  Abdominal: Soft. Normal appearance and bowel sounds are normal. There is no tenderness. There is no rigidity and no guarding.  Musculoskeletal:       Right lower leg: She exhibits no tenderness.       Left lower leg: She exhibits no tenderness.  Neurological: She is alert and oriented to person, place, and time. Gait normal.  Skin: Skin is warm and dry.  Psychiatric: Affect normal.  Vitals reviewed.    Wt Readings from Last 3 Encounters:  03/04/17 111 lb (50.3 kg)  02/18/17 108 lb (49 kg)  02/16/17 106 lb (48.1 kg)     Assessment and Plan :  1. Hypokalemia - CMP14+EGFR 2. Hypomagnesemia - Magnesium 3. Elevated lipase - Lipase 4. Chronic pancreatitis, unspecified pancreatitis type ([place]) - Lipase Patient is well-appearing today.  She is asymptomatic.  Labs pending.  Recommended picking up over-the-counter medications and prescribed medications that Dr. Brigitte Pulse sent to her pharmacy.  Plan to follow-up with Dr. Manuella Ghazi on 11/31/2018.  Return sooner if she develops any concerning symptoms.  Tenna Delaine PA-C  [facility] 03/04/2017 10:43 AM

## 2017-03-05 LAB — CMP14+EGFR
A/G RATIO: 1.6 (ref 1.2–2.2)
ALT: 23 IU/L (ref 0–32)
AST: 30 IU/L (ref 0–40)
Albumin: 3.9 g/dL (ref 3.5–5.5)
Alkaline Phosphatase: 80 IU/L (ref 39–117)
BUN/Creatinine Ratio: 39 — ABNORMAL HIGH (ref 9–23)
BUN: 24 mg/dL (ref 6–24)
Bilirubin Total: 0.3 mg/dL (ref 0.0–1.2)
CALCIUM: 9.5 mg/dL (ref 8.7–10.2)
CO2: 21 mmol/L (ref 20–29)
CREATININE: 0.61 mg/dL (ref 0.57–1.00)
Chloride: 108 mmol/L — ABNORMAL HIGH (ref 96–106)
GFR, EST AFRICAN AMERICAN: 117 mL/min/{1.73_m2} (ref >59)
GFR, EST NON AFRICAN AMERICAN: 102 mL/min/{1.73_m2} (ref >59)
GLOBULIN, TOTAL: 2.5 g/dL (ref 1.5–4.5)
Glucose: 96 mg/dL (ref 65–99)
POTASSIUM: 4.2 mmol/L (ref 3.5–5.2)
SODIUM: 144 mmol/L (ref 134–144)
TOTAL PROTEIN: 6.4 g/dL (ref 6.0–8.5)

## 2017-03-05 LAB — MAGNESIUM: Magnesium: 1.2 mg/dL — ABNORMAL LOW (ref 1.6–2.3)

## 2017-03-05 LAB — LIPASE: Lipase: 59 U/L (ref 14–72)

## 2017-03-07 ENCOUNTER — Other Ambulatory Visit: Payer: Self-pay | Admitting: Family Medicine

## 2017-03-08 NOTE — Telephone Encounter (Signed)
Pt has appt tomorrow
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
## 2017-03-09 ENCOUNTER — Encounter: Payer: Self-pay | Admitting: Cardiology

## 2017-03-09 ENCOUNTER — Ambulatory Visit (INDEPENDENT_AMBULATORY_CARE_PROVIDER_SITE_OTHER): Payer: Medicare HMO | Admitting: Family Medicine

## 2017-03-09 ENCOUNTER — Encounter: Payer: Self-pay | Admitting: Family Medicine

## 2017-03-09 ENCOUNTER — Ambulatory Visit (INDEPENDENT_AMBULATORY_CARE_PROVIDER_SITE_OTHER): Payer: Medicare HMO | Admitting: Cardiology

## 2017-03-09 VITALS — BP 110/80 | HR 94 | Temp 98.7°F | Ht 63.5 in | Wt 112.6 lb

## 2017-03-09 VITALS — BP 104/60 | HR 87 | Ht 64.0 in | Wt 113.4 lb

## 2017-03-09 DIAGNOSIS — K5901 Slow transit constipation: Secondary | ICD-10-CM

## 2017-03-09 DIAGNOSIS — K861 Other chronic pancreatitis: Secondary | ICD-10-CM

## 2017-03-09 DIAGNOSIS — N3281 Overactive bladder: Secondary | ICD-10-CM | POA: Diagnosis not present

## 2017-03-09 DIAGNOSIS — I10 Essential (primary) hypertension: Secondary | ICD-10-CM | POA: Diagnosis not present

## 2017-03-09 DIAGNOSIS — Q211 Atrial septal defect, unspecified: Secondary | ICD-10-CM

## 2017-03-09 DIAGNOSIS — H9193 Unspecified hearing loss, bilateral: Secondary | ICD-10-CM

## 2017-03-09 DIAGNOSIS — D61818 Other pancytopenia: Secondary | ICD-10-CM

## 2017-03-09 DIAGNOSIS — R350 Frequency of micturition: Secondary | ICD-10-CM | POA: Diagnosis not present

## 2017-03-09 DIAGNOSIS — R1310 Dysphagia, unspecified: Secondary | ICD-10-CM | POA: Diagnosis not present

## 2017-03-09 DIAGNOSIS — R1319 Other dysphagia: Secondary | ICD-10-CM

## 2017-03-09 DIAGNOSIS — Z9889 Other specified postprocedural states: Secondary | ICD-10-CM

## 2017-03-09 DIAGNOSIS — E876 Hypokalemia: Secondary | ICD-10-CM

## 2017-03-09 DIAGNOSIS — I214 Non-ST elevation (NSTEMI) myocardial infarction: Secondary | ICD-10-CM

## 2017-03-09 DIAGNOSIS — E785 Hyperlipidemia, unspecified: Secondary | ICD-10-CM

## 2017-03-09 LAB — POC MICROSCOPIC URINALYSIS (UMFC): MUCUS RE: ABSENT

## 2017-03-09 LAB — POCT URINALYSIS DIP (MANUAL ENTRY)
Bilirubin, UA: NEGATIVE
Blood, UA: NEGATIVE
Glucose, UA: NEGATIVE mg/dL
Ketones, POC UA: NEGATIVE mg/dL
NITRITE UA: NEGATIVE
PH UA: 6 (ref 5.0–8.0)
PROTEIN UA: NEGATIVE mg/dL
Spec Grav, UA: 1.02 (ref 1.010–1.025)
UROBILINOGEN UA: 0.2 U/dL

## 2017-03-09 MED ORDER — METOPROLOL TARTRATE 25 MG PO TABS: 25.0000 mg | ORAL_TABLET | Freq: Two times a day (BID) | ORAL | 3 refills | Status: AC

## 2017-03-09 MED ORDER — ATORVASTATIN CALCIUM 40 MG PO TABS: 40.0000 mg | ORAL_TABLET | Freq: Every day | ORAL | 1 refills | Status: AC

## 2017-03-09 NOTE — Patient Instructions (Signed)
Medication Instructions: Your physician recommends that you continue on your current medications as directed. Please refer to the Current Medication list given to you today.  Labwork: Your physician recommends that you have lab work at your PCP: Fasting Lipids - Please have all your labs faxed back to our office [phone]  Procedures/Testing: None Ordered  Follow-Up: Your physician wants you to follow-up in: 1 YEAR with Dr. Marlou Porch.  You will receive a reminder letter in the mail two months in advance. If you don't receive a letter, please call our office to schedule the follow-up appointment.  If you need a refill on your cardiac medications before your next appointment, please call your pharmacy.

## 2017-03-09 NOTE — Patient Instructions (Addendum)
IF you received an x-ray today, you will receive an invoice from [facility]. Please contact [facility] at [phone] with questions or concerns regarding your invoice.   IF you received labwork today, you will receive an invoice from [facility]. Please contact [facility] at [phone] with questions or concerns regarding your invoice.   Our billing staff will not be able to assist you with questions regarding bills from these companies.  You will be contacted with the lab results as soon as they are available. The fastest way to get your results is to activate your My Chart account. Instructions are located on the last page of this paperwork. If you have not heard from [place] regarding the results in 2 weeks, please contact this office.    Low-Fat Diet for Pancreatitis or Gallbladder Conditions A low-fat diet can be helpful if you have pancreatitis or a gallbladder condition. With these conditions, your pancreas and gallbladder have trouble digesting fats. A healthy eating plan with less fat will help rest your pancreas and gallbladder and reduce your symptoms. What do I need to know about this diet?  Eat a low-fat diet. ? Reduce your fat intake to less than 20-30% of your total daily calories. This is less than 50-60 g of fat per day. ? Remember that you need some fat in your diet. Ask your dietician what your daily goal should be. ? Choose nonfat and low-fat healthy foods. Look for the words "nonfat," "low fat," or "fat free." ? As a guide, look on the label and choose foods with less than 3 g of fat per serving. Eat only one serving.  Avoid alcohol.  Do not smoke. If you need help quitting, talk with your health care provider.  Eat small frequent meals instead of three large heavy meals. What foods can I eat? Grains Include healthy grains and starches such as potatoes, wheat bread, fiber-rich cereal, and brown rice. Choose whole grain options whenever possible. In  adults, whole grains should account for 45-65% of your daily calories. Fruits and Vegetables Eat plenty of fruits and vegetables. Fresh fruits and vegetables add fiber to your diet. Meats and Other Protein Sources Eat lean meat such as chicken and pork. Trim any fat off of meat before cooking it. Eggs, fish, and beans are other sources of protein. In adults, these foods should account for 10-35% of your daily calories. Dairy Choose low-fat milk and dairy options. Dairy includes fat and protein, as well as calcium. Fats and Oils Limit high-fat foods such as fried foods, sweets, baked goods, sugary drinks. Other Creamy sauces and condiments, such as mayonnaise, can add extra fat. Think about whether or not you need to use them, or use smaller amounts or low fat options. What foods are not recommended?  High fat foods, such as: ? [employer]. ? Ice cream. ? [place] toast. ? Sweet rolls. ? Pizza. ? Cheese bread. ? Foods covered with batter, butter, creamy sauces, or cheese. ? Fried foods. ? Sugary drinks and desserts.  Foods that cause gas or bloating This information is not intended to replace advice given to you by your health care provider. Make sure you discuss any questions you have with your health care provider. Document Released: 05/01/2013 Document Revised: 10/02/2015 Document Reviewed: 04/09/2013 Elsevier Interactive Patient Education  2017 Elsevier Inc.  Urinary Frequency, Adult Urinary frequency means urinating more often than usual. People with urinary frequency urinate at least 8 times in 24 hours, even if they drink  a normal amount of fluid. Although they urinate more often than normal, the total amount of urine produced in a day may be normal. Urinary frequency is also called pollakiuria. What are the causes? This condition may be caused by:  A urinary tract infection.  Obesity.  Bladder problems, such as bladder stones.  Caffeine or alcohol.  Eating food or  drinking fluids that irritate the bladder. These include coffee, tea, soda, artificial sweeteners, citrus, tomato-based foods, and chocolate.  Certain medicines, such as medicines that help the body get rid of extra fluid (diuretics).  Muscle or nerve weakness.  Overactive bladder.  Chronic diabetes.  Interstitial cystitis.  In men, problems with the prostate, such as an enlarged prostate.  In women, pregnancy.  In some cases, the cause may not be known. What increases the risk? This condition is more likely to develop in:  Women who have gone through menopause.  Men with prostate problems.  People with a disease or injury that affects the nerves or spinal cord.  People who have or have had a condition that affects the brain, such as a stroke.  What are the signs or symptoms? Symptoms of this condition include:  Feeling an urgent need to urinate often. The stress and anxiety of needing to find a bathroom quickly can make this urge worse.  Urinating 8 or more times in 24 hours.  Urinating as often as every 1 to 2 hours.  How is this diagnosed? This condition is diagnosed based on your symptoms, your medical history, and a physical exam. You may have tests, such as:  Blood tests.  Urine tests.  Imaging tests, such as X-rays or ultrasounds.  A bladder test.  A test of your neurological system. This is the body system that senses the need to urinate.  A test to check for problems in the urethra and bladder called cystoscopy.  You may also be asked to keep a bladder diary. A bladder diary is a record of what you eat and drink, how often you urinate, and how much you urinate. You may need to see a health care provider who specializes in conditions of the urinary tract (urologist) or kidneys (nephrologist). How is this treated? Treatment for this condition depends on the cause. Sometimes the condition goes away on its own and treatment is not necessary. If treatment is  needed, it may include:  Taking medicine.  Learning exercises that strengthen the muscles that help control urination.  Following a bladder training program. This may include: ? Learning to delay going to the bathroom. ? Double urinating (voiding). This helps if you are not completely emptying your bladder. ? Scheduled voiding.  Making diet changes, such as: ? Avoiding caffeine. ? Drinking fewer fluids, especially alcohol. ? Not drinking in the evening. ? Not having foods or drinks that may irritate the bladder. ? Eating foods that help prevent or ease constipation. Constipation can make this condition worse.  Having the nerves in your bladder stimulated. There are two options for stimulating the nerves to your bladder: ? Outpatient electrical nerve stimulation. This is done by your health care provider. ? Surgery to implant a bladder pacemaker. The pacemaker helps to control the urge to urinate.  Follow these instructions at home:  Keep a bladder diary if told to by your health care provider.  Take over-the-counter and prescription medicines only as told by your health care provider.  Do any exercises as told by your health care provider.  Follow a bladder  training program as told by your health care provider.  Make any recommended diet changes.  Keep all follow-up visits as told by your health care provider. This is important. Contact a health care provider if:  You start urinating more often.  You feel pain or irritation when you urinate.  You notice blood in your urine.  Your urine looks cloudy.  You develop a fever.  You begin vomiting. Get help right away if:  You are unable to urinate. This information is not intended to replace advice given to you by your health care provider. Make sure you discuss any questions you have with your health care provider. Document Released: 02/20/2009 Document Revised: 05/28/2015 Document Reviewed: 11/20/2014 Elsevier  Interactive Patient Education  [employer].

## 2017-03-09 NOTE — Progress Notes (Addendum)
Subjective:  By signing my name below, I, [name], attest that this documentation has been prepared under the direction and in the presence of [name], MD. Electronically Signed: [name], [place]. 03/09/2017 , 2:04 PM .  Patient was seen in Room 2 .   Patient ID: [name], female    DOB: 29-Aug-1960, 56 y.o.   MRN: [id number] Chief Complaint  Patient presents with  . Swallowing    pt states "it's better, can swallow and eat better"   HPI [name] is a 56 y.o. female who presents to Primary Care at [facility] complaining of dysphagia. Patient has a history of chronic pancreatitis and dysphagia due to esophageal dysfunction on labs drawn 3 weeks prior showed significantly low potassium and magnesium with lipase progressively elevated. She was started on potassium and magnesium supplements, also low folate so started on prenatal multivitamin daily. She came in for recheck with Tenna Delaine, PA-C; her potassium and lipase were normal, but magnesium was still low. She was also treated with several weeks of fluticasone for esophageal candidiasis, was started on Creon, 1 before each meal and metoclopramide QID.   Patient reports she received her license back last Friday (5 days ago). She has 2 days of potassium supplements left. She notes her bowels have been ball-like, up to 3 times a day. She's been able to eat and drink better, as well as drinking a protein shake. She's been taking her extended release magnesium supplement. She did have some abdominal discomfort when she started her magnesium supplement, but had improved. She doesn't have anymore choking problems since the new medications.   Patient also mentions having terrible urinary urgency and incontinence that worsened since her new medications about 3 weeks ago. She notes she has to go every 1-2 hours, rushing to the bathroom. She states she was placed on Myrbetriq a few years ago by her GYN for bladder urgency and  leakage for relief.   She mentions pharmacy calling her regarding a medication being declined. She saw her cardiologist, Dr. Marlou Porch, today; continue metoprolol. She notes 1 year follow up. She is also required to have an annual physical with her new insurance, [employer].   She also notes noticing nasal dripping recently, more when she gets cold. She's been taking OTC allegra for some relief. She also started back on her nasal spray.   Past Medical History:  Diagnosis Date  . Allergy    SEASONAL  . Anemia   . Arthritis    "hips, shoulders, hands" (11/17/2016)  . ASD (atrial septal defect)    a. s/p ASD repair in 1973  . CAD (coronary artery disease)    a. 12/2015: NSTEMI occurring after cardiac arrest secondary to aspiration PNA. Echo w/ EF of 55-60%, no WMA. Outpt ischemic eval needed.  . Cardiomyopathy   . Chronic bronchitis ([place])   . Chronic lower back pain   . Deaf    "not totally deaf; read lips and can hear some" (11/17/2016)  . Depression   . Dysphagia    due to esophageal infections.  . Fibromyalgia   . Gastritis   . GERD (gastroesophageal reflux disease)   . Hearing impairment   . High cholesterol   . History of hiatal hernia   . History of kidney stones   . Hypertension    [name], take htn medication to regulate heart beat.  . Meningitis spinal 1967  . Migraine    "all through my life" (11/17/2016)  .  Osteoporosis   . Pneumonia    "several times" (11/17/2016)  . 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 Pulmonary embolism ([place]) 2003   occured post c-section of her daughter  . Sleep apnea   . Vitamin D deficiency    Prior to Admission medications   Medication Sig Start Date End Date Taking? Authorizing Provider  acetaminophen (TYLENOL) 325 MG tablet Take 2 tablets (650 mg total) by mouth every 6 (six) hours as needed for mild pain (or Fever >/= 101).  11/20/16   [name], MD  aspirin 81 MG chewable tablet Chew 81 mg by mouth daily.    [provider]  atorvastatin (LIPITOR) 40 MG tablet Take 1 tablet (40 mg total) by mouth daily at 6 PM. 02/02/16   [name], MD  Cholecalciferol (VITAMIN D3) 2000 units TABS Take 2,000 Units by mouth daily.     [provider]  fexofenadine (ALLEGRA ALLERGY) 180 MG tablet Take 180 mg by mouth daily.    [provider]  fluticasone (FLONASE) 50 MCG/ACT nasal spray Place 2 sprays into both nostrils daily.    [provider]  lipase/protease/amylase (CREON) 36000 UNITS CPEP capsule Take 1 capsule (36,000 Units total) by mouth 3 (three) times daily before meals. 02/16/17   [name], MD  magnesium chloride (SLOW-MAG) 64 MG TBEC SR tablet Take 2 tablets (128 mg total) by mouth 2 (two) times daily. 02/28/17 03/10/17  [name], MD  Metoclopramide HCl 5 MG TBDP Take 1 tablet (5 mg total) by mouth 4 (four) times daily -  before meals and at bedtime. 02/16/17   [name], MD  metoprolol tartrate (LOPRESSOR) 25 MG tablet Take 1 tablet (25 mg total) by mouth 2 (two) times daily. 02/01/17   [name], MD  nitroGLYCERIN (NITROSTAT) 0.4 MG SL tablet Place 1 tablet (0.4 mg total) under the tongue every 5 (five) minutes as needed for chest pain. 05/11/16   [name], MD  Omega-3 Fatty Acids (FISH OIL) 1000 MG CAPS Take 1,000 mg by mouth 2 (two) times daily.     [provider]  omeprazole (PRILOSEC) 40 MG capsule Take 1 capsule (40 mg total) by mouth daily. 01/07/17 02/06/17  Milus Banister, MD  ondansetron (ZOFRAN ODT) 4 MG disintegrating tablet Take 1 tablet (4 mg total) by mouth every 8 (eight) hours as needed for nausea. 11/08/15   [name], MD  potassium chloride (KLOR-CON) 20 MEQ packet Take 20 mEq by mouth daily. 02/28/17   [name], MD  prazosin (MINIPRESS) 2 MG capsule Take 2 mg by mouth at bedtime.  02/17/16   [provider]  Prenatal  Multivit-Min-Fe-FA (PRENATAL VITAMINS PO) Take 1 tablet by mouth daily.    [provider]  PRESCRIPTION MEDICATION Inhale into the lungs at bedtime. CPAP    [provider]  Vilazodone HCl (VIIBRYD) 40 MG TABS Take 40 mg by mouth daily.    [provider]  vitamin B-12 (CYANOCOBALAMIN) 500 MCG tablet Take 500 mcg by mouth daily.    [provider]   Allergies  Allergen Reactions  . Depakote [Divalproex Sodium] Anaphylaxis  . Diazepam Other (See Comments)    Died on operating table. TOLERATES ATIVAN AND LIBRIUM W/O DIFFICULTY  . Valium Anaphylaxis    Tolerates Ativan and Librium w/o complication  . Tizanidine Hcl  Other (See Comments)    "passed out"   Past Surgical History:  Procedure Laterality Date  . APPENDECTOMY    . ATRIAL SEPTAL DEFECT(ASD) CLOSURE  1973  . [place]  . CESAREAN SECTION  2003  . COCHLEAR IMPLANT Right   . ESOPHAGOGASTRODUODENOSCOPY (EGD) WITH PROPOFOL N/A 07/20/2016   Procedure: ESOPHAGOGASTRODUODENOSCOPY (EGD) WITH PROPOFOL;  Surgeon: Gatha Mayer, MD;  Location: [place];  Service: Endoscopy;  Laterality: N/A;  . EYE MUSCLE SURGERY Bilateral   . INCISION AND DRAINAGE OF WOUND  1993   S/P c-section  . LAPAROSCOPIC CHOLECYSTECTOMY  2012  . MITRAL VALVE REPAIR  05/2005  . TONSILLECTOMY AND ADENOIDECTOMY    . TUBAL LIGATION  1993  . TYMPANOSTOMY TUBE PLACEMENT Bilateral "lots"    Family History  Problem Relation Age of Onset  . Breast cancer Mother        bilateral; ages 60 and 70; TAH/BSO ~50  . Depression Sister   . Heart disease Father   . Hypertension Father   . Heart attack Father   . Colon cancer Neg Hx   . Esophageal cancer Neg Hx   . Pancreatic cancer Neg Hx   . Rectal cancer Neg Hx   . Stomach cancer Neg Hx    Social History   Social History  . Marital status: Divorced    Spouse name: N/A  . Number of children: N/A  . Years of education: N/A   Social History Main Topics    . Smoking status: Never Smoker  . Smokeless tobacco: Never Used  . 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 Drug use: Yes    Types: Marijuana     Comment: 11/17/2016 "tried marijuana a few times in college"  . Sexual activity: Not Currently    Birth control/ protection: Surgical     Comment: 1st intercourse- 19, partners- 6   Other Topics Concern  . None   Social History Narrative   Lives with 72 year old daughter.       Depression screen [facility] 2/9 03/09/2017 03/04/2017 02/18/2017 02/16/2017 09/28/2016  Decreased Interest 0 0 0 0 1  Down, Depressed, Hopeless 0 0 0 0 1  PHQ - 2 Score 0 0 0 0 2  Altered sleeping - - - - 1  Tired, decreased energy - - - - 1  Change in appetite - - - - 1  Feeling bad or failure about yourself  - - - - 1  Trouble concentrating - - - - 1  Moving slowly or fidgety/restless - - - - 1  Suicidal thoughts - - - - 1  PHQ-9 Score - - - - 9  Difficult doing work/chores - - - - Very difficult  Some recent data might be hidden    Review of Systems  Constitutional: Negative for chills, fatigue, fever and unexpected weight change.  HENT: Negative for trouble swallowing.   Respiratory: Negative for cough.   Gastrointestinal: Negative for abdominal pain, constipation, diarrhea, nausea and vomiting.  Genitourinary: Positive for frequency and urgency.  Skin: Negative for rash and wound.  Neurological: Negative for dizziness, weakness and headaches.       Objective:   Physical Exam  Constitutional: She is oriented to person, place, and time. She appears well-developed and well-nourished. No distress.  HENT:  Head: Normocephalic and atraumatic.  Right Ear: Tympanic membrane normal.  Left Ear: Tympanic membrane normal.  Mouth/Throat: Oropharynx is clear and moist.  Pale boggy mucosal, L>R  Eyes: Pupils are equal, round, and reactive to light. EOM are normal.  Neck:  Neck supple.  Cardiovascular: Normal rate, regular rhythm and normal heart sounds.   Pulmonary/Chest: Effort normal. No respiratory distress.  Musculoskeletal: Normal range of motion.  Neurological: She is alert and oriented to person, place, and time.  Skin: Skin is warm and dry.  Psychiatric: She has a normal mood and affect. Her behavior is normal.  Nursing note and vitals reviewed.   BP 110/80   Pulse 94   Temp 98.7 F (37.1 C) (Oral)   Ht 5' 3.5" (1.613 m)   Wt 112 lb 9.6 oz (51.1 kg)   BMI 19.63 kg/m       Assessment & Plan:  Check Lipids, Mg, K, lipase at AWV w [place] - hand in fecal pancreatic elastase sample then as well (collection stuff given today). Sched AWV for 2-4 wks, then follow-up the next w/ for CPE w/ me.   1. Chronic pancreatitis, unspecified pancreatitis type ([place]) - check fecal elastase. Has responded well to the creon so cont tidac.  2. Urinary frequency   3. OAB (overactive bladder) - if UClx neg and sxs do not resolve after constipation is treated, ok to restart myrbetriq  4. Slow transit constipation - improving. Cont reglan and magnesium supp  5. Hypomagnesemia - Cont mag supp qhs - for at least 2 weeks - can stop if gets diarrhea. Suspect this occurred because of severe diarrhea during pancreatitis flairs  6. Hypokalemia - can stop K supp after completed the 1 wk course  7. Hyperlipidemia, unspecified hyperlipidemia type - Please forward labs to Attn: Cecilie Kicks, NP Cardiology [facility] Fax [phone]  8. NSTEMI (non-ST elevated myocardial infarction) (HCC) - cont statin and BB  9. Esophageal dysphagia - improved w/ reglan - cont tidac &qhs. Refill prn      Orders Placed This Encounter  Procedures  . Urine Culture  . Pancreatic Elastase, Fecal  . POCT urinalysis dipstick  . POCT Microscopic Urinalysis ([facility])    Meds ordered this encounter  Medications  . metoprolol tartrate (LOPRESSOR) 25 MG tablet    Sig: Take 1 tablet (25 mg  total) by mouth 2 (two) times daily.    Dispense:  180 tablet    Refill:  3  . atorvastatin (LIPITOR) 40 MG tablet    Sig: Take 1 tablet (40 mg total) by mouth daily at 6 PM.    Dispense:  90 tablet    Refill:  1    I personally performed the services described in this documentation, which was scribed in my presence. The recorded information has been reviewed and considered, and addended by me as needed.   [name], M.D.  Primary Care at [facility] [address] [place], [place] [address] [phone] phone [phone] fax  03/11/17 1:15 AM

## 2017-03-10 DIAGNOSIS — R69 Illness, unspecified: Secondary | ICD-10-CM | POA: Diagnosis not present

## 2017-03-10 LAB — URINE CULTURE

## 2017-03-11 MED ORDER — MIRABEGRON ER 25 MG PO TB24: 25.0000 mg | ORAL_TABLET | Freq: Every day | ORAL | 3 refills | Status: DC

## 2017-03-11 NOTE — Addendum Note (Signed)
Addended by: [name] on: 03/11/2017 11:09 AM   Modules accepted: Orders

## 2017-03-15 ENCOUNTER — Other Ambulatory Visit (INDEPENDENT_AMBULATORY_CARE_PROVIDER_SITE_OTHER): Payer: Medicare HMO

## 2017-03-15 ENCOUNTER — Ambulatory Visit (INDEPENDENT_AMBULATORY_CARE_PROVIDER_SITE_OTHER): Payer: Medicare HMO | Admitting: Gastroenterology

## 2017-03-15 ENCOUNTER — Encounter: Payer: Self-pay | Admitting: Gastroenterology

## 2017-03-15 VITALS — BP 94/68 | HR 88 | Ht 63.25 in | Wt 115.0 lb

## 2017-03-15 DIAGNOSIS — F1020 Alcohol dependence, uncomplicated: Secondary | ICD-10-CM | POA: Diagnosis not present

## 2017-03-15 DIAGNOSIS — F10929 Alcohol use, unspecified with intoxication, unspecified: Secondary | ICD-10-CM

## 2017-03-15 DIAGNOSIS — R1310 Dysphagia, unspecified: Secondary | ICD-10-CM | POA: Diagnosis not present

## 2017-03-15 DIAGNOSIS — R69 Illness, unspecified: Secondary | ICD-10-CM | POA: Diagnosis not present

## 2017-03-15 DIAGNOSIS — K860 Alcohol-induced chronic pancreatitis: Secondary | ICD-10-CM

## 2017-03-15 LAB — CBC WITH DIFFERENTIAL/PLATELET
BASOS PCT: 1.4 % (ref 0.0–3.0)
Basophils Absolute: 0.1 10*3/uL (ref 0.0–0.1)
EOS PCT: 3.5 % (ref 0.0–5.0)
Eosinophils Absolute: 0.1 10*3/uL (ref 0.0–0.7)
HEMATOCRIT: 33.7 % — AB (ref 36.0–46.0)
HEMOGLOBIN: 11.5 g/dL — AB (ref 12.0–15.0)
Lymphocytes Relative: 18.1 % (ref 12.0–46.0)
Lymphs Abs: 0.7 10*3/uL (ref 0.7–4.0)
MCHC: 34.2 g/dL (ref 30.0–36.0)
MCV: 96.3 fl (ref 78.0–100.0)
MONO ABS: 0.3 10*3/uL (ref 0.1–1.0)
Monocytes Relative: 8.2 % (ref 3.0–12.0)
Neutro Abs: 2.8 10*3/uL (ref 1.4–7.7)
Neutrophils Relative %: 68.8 % (ref 43.0–77.0)
Platelets: 237 10*3/uL (ref 150.0–400.0)
RBC: 3.5 Mil/uL — ABNORMAL LOW (ref 3.87–5.11)
RDW: 14.2 % (ref 11.5–15.5)
WBC: 4.1 10*3/uL (ref 4.0–10.5)

## 2017-03-15 LAB — PROTIME-INR
INR: 1.1 ratio — AB (ref 0.8–1.0)
Prothrombin Time: 11.5 s (ref 9.6–13.1)

## 2017-03-15 LAB — COMPREHENSIVE METABOLIC PANEL
ALBUMIN: 3.9 g/dL (ref 3.5–5.2)
ALT: 19 U/L (ref 0–35)
AST: 22 U/L (ref 0–37)
Alkaline Phosphatase: 87 U/L (ref 39–117)
BUN: 13 mg/dL (ref 6–23)
CHLORIDE: 103 meq/L (ref 96–112)
CO2: 24 mEq/L (ref 19–32)
Calcium: 9.8 mg/dL (ref 8.4–10.5)
Creatinine, Ser: 0.59 mg/dL (ref 0.40–1.20)
GFR: 111.88 mL/min (ref >60.00)
Glucose, Bld: 122 mg/dL — ABNORMAL HIGH (ref 70–99)
POTASSIUM: 4.1 meq/L (ref 3.5–5.1)
SODIUM: 138 meq/L (ref 135–145)
Total Bilirubin: 0.8 mg/dL (ref 0.2–1.2)
Total Protein: 6.8 g/dL (ref 6.0–8.3)

## 2017-03-15 NOTE — Patient Instructions (Addendum)
You will have labs checked today in the basement lab.  Please head down after you check out with the front desk  (INR, cbc, cmet).  Ultrasound of liver with elastography. You have been scheduled for an abdominal ultrasound at [facility]iology (1st floor of hospital) on 03/23/17 at 930 am. Please arrive 15 minutes prior to your appointment for registration. Make certain not to have anything to eat or drink 6 hours prior to your appointment. Should you need to reschedule your appointment, please contact radiology at [phone]. This test typically takes about 30 minutes to perform.  Normal BMI (Body Mass Index- based on height and weight) is between 19 and 25. Your BMI today is Body mass index is 20.21 kg/m. [name] Please consider follow up  regarding your BMI with your Primary Care Provider.  Please return to see Dr. Ardis Hughs on 05/24/17 at 8:45 am.  Congratulations on quitting drinking.  Normal BMI (Body Mass Index- based on height and weight) is between 19 and 25. Your BMI today is Body mass index is 20.21 kg/m. [name] Please consider follow up  regarding your BMI with your Primary Care Provider.

## 2017-03-15 NOTE — Progress Notes (Signed)
Review of pertinent gastrointestinal problems: 1. Routine risk for colon cancer: colonsocopy Dr. Ardis Hughs 2009 was normal. Repeat colonoscopy Dr. Ardis Hughs 10/2013 normal except single diminutive polyp that was removed, not retrieved, recommended recall in 10 years. 2. Mild gastritis,EGD Dr. Ardis Hughs 10/2013; biopsies show reactive gastropathy, no H pylori. Also manilia esophagititis, treated with diflucan empirically. 3. Dysphagia from recurrent yeast infections:11/15 EGD Dr. Ardis Hughs found [place], tortuous esophagus, again candida esophagitis, treated with diflucan. Office visit April 2016 she reported that she has always had trouble with used infections after antibiotics. Even when she was a little girl. She will get vaginal infections, abdominal wall infections. Lately she has had trouble with esophageal yeast infections. EGD 07/2016 Dr. Carlean Purl showed candida esophagitis again and also Schatzki's ring was dilated with scope passage (15mm up to 14-26mm).  Treated fungal infection again. Only wears upper dentures. EGD 12/2016 Dr. Ardis Hughs; + candida infection, medium sized HH, focal peptic appearing stricture dilated with scope passage, moderate gastritis (felt etoh related); diflucan called in, omerpazole increased to 40mg , advised to wear uppers and lowers; 03/2017 she reported dysphagia is better. 4. ?chronic pancreatitis, ? Cirrhosis: not sure she has either of these. No imaging to date (03/2017) has showed abnormal pancreas, CT scan 11/2016 showed "Prominence of the lateral segment of the left lobe of the liver and caudate lobe, raising question of a degree of hepatic cirrhosis." 5. Alcoholism; Detox at [facility] August 2018. No alcohol since then    HPI: This is a Very pleasant 56 year old woman whom I last saw at the time of an upper endoscopy about 3 months ago.  She tells me her swallowing has been very good since then. She did have a bit of issues a few weeks ago and her primary care physician called her  in a refill of liquid Diflucan and that helped.  Was put  Back on diflucan liquid after some changes in her swallowing again.  She has dentures upper and bottoms. She normally only put bottoms and when she is eating I don't seem to fit too well.  Lately feeling very well, gaining weight back.  Taking her medicine.  Says she feels about 100% better than 3 or 4 months ago  There have been questions about possible underlying chronic pancreatitis and also question of underlying cirrhosis  Went to detox at [facility]; goes to online AA type meetings.  Last etoh late august  Chief complaint is Dysphasia  ROS: complete GI ROS as described in HPI, all other review negative.  Constitutional:  No unintentional weight loss   Past Medical History:  Diagnosis Date  . Allergy    SEASONAL  . Anemia   . Aortic atherosclerosis ([place])   . Arthritis    "hips, shoulders, hands" (11/17/2016)  . ASD (atrial septal defect)    a. s/p ASD repair in 1973  . CAD (coronary artery disease)    a. 12/2015: NSTEMI occurring after cardiac arrest secondary to aspiration PNA. Echo w/ EF of 55-60%, no WMA. Outpt ischemic eval needed.  . Cardiomyopathy   . Chronic bronchitis ([place])   . Chronic lower back pain   . Deaf    "not totally deaf; read lips and can hear some" (11/17/2016)  . Depression   . Diverticulosis   . Dysphagia    due to esophageal infections.  . Fibromyalgia   . Gastritis   . GERD (gastroesophageal reflux disease)   . Hearing impairment   . Hepatic steatosis   . High cholesterol   .  History of hiatal hernia   . History of kidney stones   . Hypertension    [name], take htn medication to regulate heart beat.  . Meningitis spinal 1967  . Migraine    "all through my life" (11/17/2016)  . Osteoporosis   . Pneumonia    "several times" (11/17/2016)  . PTSD (post-traumatic stress disorder) 12/2015   "S/P esophagus was paralyzed and I choked to death; they did code blue; brought me back"  (11/17/2016)  . Pulmonary embolism ([place]) 2003   occured post c-section of her daughter  . Sleep apnea   . Vitamin D deficiency     Past Surgical History:  Procedure Laterality Date  . APPENDECTOMY    . ATRIAL SEPTAL DEFECT(ASD) CLOSURE  1973  . [place]  . CESAREAN SECTION  2003  . COCHLEAR IMPLANT Right   . EYE MUSCLE SURGERY Bilateral   . INCISION AND DRAINAGE OF WOUND  1993   S/P c-section  . LAPAROSCOPIC CHOLECYSTECTOMY  2012  . MITRAL VALVE REPAIR  05/2005  . TONSILLECTOMY AND ADENOIDECTOMY    . TUBAL LIGATION  1993  . TYMPANOSTOMY TUBE PLACEMENT Bilateral "lots"    Current Outpatient Medications  Medication Sig Dispense Refill  . acetaminophen (TYLENOL) 325 MG tablet Take 2 tablets (650 mg total) by mouth every 6 (six) hours as needed for mild pain (or Fever >/= 101).    [name] aspirin 81 MG chewable tablet Chew 81 mg by mouth daily.    [name] atorvastatin (LIPITOR) 40 MG tablet Take 1 tablet (40 mg total) by mouth daily at 6 PM. 90 tablet 1  . Cholecalciferol (VITAMIN D3) 2000 units TABS Take 2,000 Units by mouth daily.     . fexofenadine (ALLEGRA ALLERGY) 180 MG tablet Take 180 mg by mouth daily.    . fluticasone (FLONASE) 50 MCG/ACT nasal spray Place 2 sprays into both nostrils daily.    . lipase/protease/amylase (CREON) 36000 UNITS CPEP capsule Take 1 capsule (36,000 Units total) by mouth 3 (three) times daily before meals. 90 capsule 2  . magnesium chloride (SLOW-MAG) 64 MG TBEC SR tablet Take 2 tablets 2 (two) times daily by mouth.    . Metoclopramide HCl 5 MG TBDP Take 1 tablet (5 mg total) by mouth 4 (four) times daily -  before meals and at bedtime. 120 tablet 1  . metoprolol tartrate (LOPRESSOR) 25 MG tablet Take 1 tablet (25 mg total) by mouth 2 (two) times daily. 180 tablet 3  . mirabegron ER (MYRBETRIQ) 25 MG TB24 tablet Take 1 tablet (25 mg total) by mouth daily. 30 tablet 3  . Omega-3 Fatty Acids (FISH OIL) 1000 MG CAPS Take 1,000 mg by mouth 2  (two) times daily.     . ondansetron (ZOFRAN ODT) 4 MG disintegrating tablet Take 1 tablet (4 mg total) by mouth every 8 (eight) hours as needed for nausea. 6 tablet 0  . prazosin (MINIPRESS) 2 MG capsule Take 2 mg by mouth at bedtime.     . Prenatal Multivit-Min-Fe-FA (PRENATAL VITAMINS PO) Take 1 tablet by mouth daily.    [name] PRESCRIPTION MEDICATION Inhale into the lungs at bedtime. CPAP    . Vilazodone HCl (VIIBRYD) 40 MG TABS Take 40 mg by mouth daily.    . vitamin B-12 (CYANOCOBALAMIN) 500 MCG tablet Take 500 mcg by mouth daily.    . nitroGLYCERIN (NITROSTAT) 0.4 MG SL tablet Place 1 tablet (0.4 mg total) under the tongue every 5 (five) minutes as needed for  chest pain. (Patient not taking: Reported on 03/15/2017) 30 tablet 0  . omeprazole (PRILOSEC) 40 MG capsule Take 1 capsule (40 mg total) by mouth daily. 30 capsule 11   Current Facility-Administered Medications  Medication Dose Route Frequency Provider Last Rate Last Dose  . 0.9 %  sodium chloride infusion  500 mL Intravenous Continuous Milus Banister, MD        Allergies as of 03/15/2017 - Review Complete 03/15/2017  Allergen Reaction Noted  . Depakote [divalproex sodium] Anaphylaxis 07/01/2012  . Diazepam Other (See Comments) 09/03/2015  . Valium Anaphylaxis 09/23/2010  . Tizanidine hcl Other (See Comments) 12/06/2015    Family History  Problem Relation Age of Onset  . Breast cancer Mother        bilateral; ages 19 and 60; TAH/BSO ~50  . Depression Sister   . Heart disease Father   . Hypertension Father   . Heart attack Father   . Colon cancer Neg Hx   . Esophageal cancer Neg Hx   . Pancreatic cancer Neg Hx   . Rectal cancer Neg Hx   . Stomach cancer Neg Hx     Social History   Socioeconomic History  . Marital status: Divorced    Spouse name: Not on file  . Number of children: Not on file  . Years of education: Not on file  . Highest education level: Not on file  Social Needs  . Financial resource strain: Not  on file  . Food insecurity - worry: Not on file  . Food insecurity - inability: Not on file  . Transportation needs - medical: Not on file  . Transportation needs - non-medical: Not on file  Occupational History  . Not on file  Tobacco Use  . Smoking status: Never Smoker  . Smokeless tobacco: Never Used  Substance and Sexual Activity  . Alcohol use: Yes    Alcohol/week: 0.0 oz    Comment: 11/17/2016 "I'm trying to be a recovering alcoholic; last drink was 1 pint of vodka 1 wk ago"  . Drug use: Yes    Types: Marijuana    Comment: 11/17/2016 "tried marijuana a few times in college"  . Sexual activity: Not Currently    Birth control/protection: Surgical    Comment: 1st intercourse- 19, partners- 6  Other Topics Concern  . Not on file  Social History Narrative   Lives with 13 year old daughter.         Physical Exam: BP 94/68 (BP Location: Left Arm, Patient Position: Sitting, Cuff Size: Normal)   Pulse 88   Ht 5' 3.25" (1.607 m) Comment: height measured without shoes  Wt 115 lb (52.2 kg)   BMI 20.21 kg/m  Constitutional: generally well-appearing Psychiatric: alert and oriented x3 Abdomen: soft, nontender, nondistended, no obvious ascites, no peritoneal signs, normal bowel sounds No peripheral edema noted in lower extremities  Assessment and plan: 56 y.o. female with Chronic dysphagia with intermittent candidal yeast infections, alcoholism  First her swallowing difficulties are much improved lately. She has recurrent esophageal yeast infections and going forward I think it is reasonable to just empirically start her on Diflucan if she has significant dysphagia again. She is overall much improved since she stopped drinking alcohol 3 months ago and went through detox program. I think that is the greatest factor that is leading her to feel so much better now and probably in the future as well. There are questions about possible underlying cirrhosis. I don't think she has cirrhosis.  She has  normal platelets, she has no signs of portal hypertension endoscopically, the CT scan findings are vague and on significant at best. I'm going to order an ultrasound with elastography to settle the issue.  There are also been questions about possible underlying pancreatitis chronic. Not sure she has this. None of her 3 CT scans in the past 2 or 3 years show any pancreatic issues at all. She'll return to see me in 2 months and sooner if needed. I'm repeating a set of labs including a complete about profile, CBC and coags.  Please see the "Patient Instructions" section for addition details about the plan.  Owens Loffler, MD [facility] 03/15/2017, 9:09 AM

## 2017-03-16 DIAGNOSIS — R69 Illness, unspecified: Secondary | ICD-10-CM | POA: Diagnosis not present

## 2017-03-17 ENCOUNTER — Telehealth: Payer: Self-pay

## 2017-03-17 NOTE — Telephone Encounter (Signed)
Earlier I had scheduled patient a CPE with Dr. Brigitte Pulse, however, checking her chart today, I see that she has cancelled that appointment.  Tried to call her to make a new one, but could not reach her.

## 2017-03-23 ENCOUNTER — Ambulatory Visit (HOSPITAL_COMMUNITY)
Admission: RE | Admit: 2017-03-23 | Discharge: 2017-03-23 | Disposition: A | Discharge status: Home / Self Care | Payer: Medicare HMO | Source: Ambulatory Visit | Attending: Gastroenterology | Admitting: Gastroenterology

## 2017-03-23 DIAGNOSIS — K860 Alcohol-induced chronic pancreatitis: Secondary | ICD-10-CM | POA: Insufficient documentation

## 2017-03-23 DIAGNOSIS — R69 Illness, unspecified: Secondary | ICD-10-CM | POA: Diagnosis not present

## 2017-03-23 DIAGNOSIS — Z9049 Acquired absence of other specified parts of digestive tract: Secondary | ICD-10-CM | POA: Insufficient documentation

## 2017-03-23 DIAGNOSIS — F10929 Alcohol use, unspecified with intoxication, unspecified: Secondary | ICD-10-CM

## 2017-03-23 DIAGNOSIS — K861 Other chronic pancreatitis: Secondary | ICD-10-CM | POA: Diagnosis not present

## 2017-03-24 DIAGNOSIS — R69 Illness, unspecified: Secondary | ICD-10-CM | POA: Diagnosis not present

## 2017-04-07 ENCOUNTER — Ambulatory Visit: Payer: Medicare HMO

## 2017-04-07 DIAGNOSIS — R69 Illness, unspecified: Secondary | ICD-10-CM | POA: Diagnosis not present

## 2017-04-10 DIAGNOSIS — F1094 Alcohol use, unspecified with alcohol-induced mood disorder: Secondary | ICD-10-CM | POA: Diagnosis not present

## 2017-04-10 DIAGNOSIS — I2510 Atherosclerotic heart disease of native coronary artery without angina pectoris: Secondary | ICD-10-CM | POA: Diagnosis not present

## 2017-04-10 DIAGNOSIS — I252 Old myocardial infarction: Secondary | ICD-10-CM | POA: Diagnosis not present

## 2017-04-10 DIAGNOSIS — Z951 Presence of aortocoronary bypass graft: Secondary | ICD-10-CM | POA: Diagnosis not present

## 2017-04-10 DIAGNOSIS — I10 Essential (primary) hypertension: Secondary | ICD-10-CM | POA: Diagnosis not present

## 2017-04-10 DIAGNOSIS — F1024 Alcohol dependence with alcohol-induced mood disorder: Secondary | ICD-10-CM | POA: Diagnosis not present

## 2017-04-10 DIAGNOSIS — R45851 Suicidal ideations: Secondary | ICD-10-CM | POA: Diagnosis not present

## 2017-04-10 DIAGNOSIS — Y906 Blood alcohol level of 120-199 mg/100 ml: Secondary | ICD-10-CM | POA: Diagnosis not present

## 2017-04-10 DIAGNOSIS — E876 Hypokalemia: Secondary | ICD-10-CM | POA: Diagnosis not present

## 2017-04-10 DIAGNOSIS — R69 Illness, unspecified: Secondary | ICD-10-CM | POA: Diagnosis not present

## 2017-04-11 DIAGNOSIS — F1094 Alcohol use, unspecified with alcohol-induced mood disorder: Secondary | ICD-10-CM | POA: Diagnosis not present

## 2017-04-11 DIAGNOSIS — R45851 Suicidal ideations: Secondary | ICD-10-CM | POA: Diagnosis not present

## 2017-04-11 DIAGNOSIS — R69 Illness, unspecified: Secondary | ICD-10-CM | POA: Diagnosis not present

## 2017-04-12 DIAGNOSIS — R69 Illness, unspecified: Secondary | ICD-10-CM | POA: Diagnosis not present

## 2017-04-13 DIAGNOSIS — R69 Illness, unspecified: Secondary | ICD-10-CM | POA: Diagnosis not present

## 2017-04-14 ENCOUNTER — Encounter: Payer: Medicare HMO | Admitting: Family Medicine

## 2017-04-14 DIAGNOSIS — R69 Illness, unspecified: Secondary | ICD-10-CM | POA: Diagnosis not present

## 2017-04-28 DIAGNOSIS — R69 Illness, unspecified: Secondary | ICD-10-CM | POA: Diagnosis not present

## 2017-04-30 ENCOUNTER — Other Ambulatory Visit: Payer: Self-pay

## 2017-04-30 ENCOUNTER — Ambulatory Visit (INDEPENDENT_AMBULATORY_CARE_PROVIDER_SITE_OTHER): Payer: Medicare HMO | Admitting: Family Medicine

## 2017-04-30 ENCOUNTER — Encounter: Payer: Medicare HMO | Admitting: Family Medicine

## 2017-04-30 ENCOUNTER — Encounter: Payer: Self-pay | Admitting: Family Medicine

## 2017-04-30 VITALS — BP 114/82 | HR 75 | Temp 98.6°F | Resp 16 | Ht 63.25 in | Wt 117.8 lb

## 2017-04-30 DIAGNOSIS — K860 Alcohol-induced chronic pancreatitis: Secondary | ICD-10-CM

## 2017-04-30 DIAGNOSIS — R1319 Other dysphagia: Secondary | ICD-10-CM

## 2017-04-30 DIAGNOSIS — Z23 Encounter for immunization: Secondary | ICD-10-CM

## 2017-04-30 DIAGNOSIS — F332 Major depressive disorder, recurrent severe without psychotic features: Secondary | ICD-10-CM

## 2017-04-30 DIAGNOSIS — R69 Illness, unspecified: Secondary | ICD-10-CM | POA: Diagnosis not present

## 2017-04-30 DIAGNOSIS — N3281 Overactive bladder: Secondary | ICD-10-CM

## 2017-04-30 DIAGNOSIS — Z1231 Encounter for screening mammogram for malignant neoplasm of breast: Secondary | ICD-10-CM

## 2017-04-30 DIAGNOSIS — E785 Hyperlipidemia, unspecified: Secondary | ICD-10-CM

## 2017-04-30 DIAGNOSIS — M818 Other osteoporosis without current pathological fracture: Secondary | ICD-10-CM

## 2017-04-30 DIAGNOSIS — D61818 Other pancytopenia: Secondary | ICD-10-CM

## 2017-04-30 DIAGNOSIS — I214 Non-ST elevation (NSTEMI) myocardial infarction: Secondary | ICD-10-CM

## 2017-04-30 DIAGNOSIS — E876 Hypokalemia: Secondary | ICD-10-CM | POA: Diagnosis not present

## 2017-04-30 DIAGNOSIS — R1312 Dysphagia, oropharyngeal phase: Secondary | ICD-10-CM

## 2017-04-30 DIAGNOSIS — Z136 Encounter for screening for cardiovascular disorders: Secondary | ICD-10-CM

## 2017-04-30 DIAGNOSIS — H9193 Unspecified hearing loss, bilateral: Secondary | ICD-10-CM

## 2017-04-30 DIAGNOSIS — Z0000 Encounter for general adult medical examination without abnormal findings: Secondary | ICD-10-CM | POA: Diagnosis not present

## 2017-04-30 DIAGNOSIS — Z1389 Encounter for screening for other disorder: Secondary | ICD-10-CM | POA: Diagnosis not present

## 2017-04-30 DIAGNOSIS — K861 Other chronic pancreatitis: Secondary | ICD-10-CM

## 2017-04-30 DIAGNOSIS — Z1383 Encounter for screening for respiratory disorder NEC: Secondary | ICD-10-CM

## 2017-04-30 DIAGNOSIS — R1310 Dysphagia, unspecified: Secondary | ICD-10-CM | POA: Diagnosis not present

## 2017-04-30 DIAGNOSIS — R9431 Abnormal electrocardiogram [ECG] [EKG]: Secondary | ICD-10-CM

## 2017-04-30 DIAGNOSIS — F1021 Alcohol dependence, in remission: Secondary | ICD-10-CM

## 2017-04-30 DIAGNOSIS — Z1239 Encounter for other screening for malignant neoplasm of breast: Secondary | ICD-10-CM

## 2017-04-30 LAB — POCT URINALYSIS DIP (MANUAL ENTRY)
Bilirubin, UA: NEGATIVE
Glucose, UA: NEGATIVE mg/dL
Ketones, POC UA: NEGATIVE mg/dL
Leukocytes, UA: NEGATIVE
Nitrite, UA: NEGATIVE
PROTEIN UA: NEGATIVE mg/dL
RBC UA: NEGATIVE
SPEC GRAV UA: 1.02 (ref 1.010–1.025)
UROBILINOGEN UA: 0.2 U/dL
pH, UA: 6 (ref 5.0–8.0)

## 2017-04-30 LAB — POC MICROSCOPIC URINALYSIS (UMFC): Mucus: ABSENT

## 2017-04-30 MED ORDER — MIRABEGRON ER 50 MG PO TB24: 50.0000 mg | ORAL_TABLET | Freq: Every day | ORAL | 1 refills | Status: AC

## 2017-04-30 NOTE — Progress Notes (Deleted)
Patient ID: [name], female   DOB: [birth year], 56 y.o.   MRN: [id number] Does [facility] have anyone who manages osteoporosis/ Rxs Reclast? If they don't -> [facility] Dr Hal Morales No prior shingles vaccine Not sure if she had chicken pox - thinks she might have had it as a baby when she was very ill.

## 2017-04-30 NOTE — Progress Notes (Addendum)
Subjective:  By signing my name below, I, [name], attest that this documentation has been prepared under the direction and in the presence of [name], MD. Electronically Signed: [name], [place]. 04/30/2017 , 9:19 AM .  Patient was seen in Room 2 .   Patient ID: RHAELYN GIRON, female    DOB: June 01, 1960, 56 y.o.   MRN: [id number] Chief Complaint  Patient presents with  . Annual Exam   HPI KAILYNN SATTERLY is a 56 y.o. female who presents to Primary Care at [facility] for an annual physical. She will schedule an appointment with [name] for Annual Wellness Visit.   Recent Hospitalization Patient was hospitalized from Dec 2nd to Dec 5th with alcohol withdrawal, with similar occurrences twice in July and once in August earlier of this year. I last saw her 6 weeks prior where she was adamant she was sober.   She had really bad relapse, and was in hospital for detox; has been sober for 20 days now. She states she has more energy now and eating more with larger appetite. She's been going to counseling at the [facility], seen by Morley Kos. She also attends AA online, and also speaking up. Her daughter did move out to her father's during her relapse, but has come back since.   Primary Preventative Screenings: Cervical Cancer: She had a normal pap with negative HR HPV 02/26/2013 at [facility], also normal pap 03/30/11. Pt denies any h/o abnml pap or pelvic concerns/abnmls. STI screening: Denies need Breast Cancer: Mam 07/29/2016 at [place]: Colonoscopy June 2015, Dr. Ardis Hughs; repeat 10 years.  Tobacco use/EtOH/substances: See above regarding recent hospitalization.  Bone Density: She had bone density done recently, but no results back yet. She's had Reclast twice. She's seen [facility] in the past.  Cardiac: Seen Dr. Marlou Porch prev - 12/2015 - NSTEMI after acute resp arrest from choking on a pill WHILE in the hosp. QT prolongation - last EKG 11/29/16 QTc  519 Weight/Blood sugar/Diet/Exercise: She has gained some weight since last visit which she is very happy about - reflects that she is eating better and better mood as when depressed stops eating entirely and only drinks vodka for days. BMI Readings from Last 3 Encounters:  04/30/17 20.70 kg/m  03/15/17 20.21 kg/m  03/09/17 19.63 kg/m   Wt Readings from Last 3 Encounters:  04/30/17 117 lb 12.8 oz (53.4 kg)  03/15/17 115 lb (52.2 kg)  03/09/17 112 lb 9.6 oz (51.1 kg)   Lab Results  Component Value Date   HGBA1C 4.5 (L) 07/17/2016   OTC/Vit/Supp/Herbal: Dentist/Optho: Immunizations:  Immunization History  Administered Date(s) Administered  . Influenza,inj,Quad PF,6+ Mos 02/26/2013  . Pneumococcal Polysaccharide-23 01/03/2017  . Tdap 09/03/2015   Flu shot: She felt sick after receiving flu vaccine in the past, feeling sick for 3 days. She will receive flu vaccine at her pharmacy after the holidays.   Chronic Medical Conditions: Urinary frequency: She takes Myrbetriq for urinary urgency and incontinence. She's taken this in the past, working at 50mg ; currently taking 25mg  right now.   Past Medical History:  Diagnosis Date  . Allergy    SEASONAL  . Anemia   . Aortic atherosclerosis ([place])   . Arthritis    "hips, shoulders, hands" (11/17/2016)  . ASD (atrial septal defect)    a. s/p ASD repair in 1973  . CAD (coronary artery disease)    a. 12/2015: NSTEMI occurring after cardiac arrest secondary to aspiration PNA. Echo  w/ EF of 55-60%, no WMA. Outpt ischemic eval needed.  . Cardiomyopathy   . Chronic bronchitis ([place])   . Chronic lower back pain   . Deaf    "not totally deaf; read lips and can hear some" (11/17/2016)  . Depression   . Diverticulosis   . Dysphagia    due to esophageal infections.  . Fibromyalgia   . Gastritis   . GERD (gastroesophageal reflux disease)   . Hearing impairment   . Hepatic steatosis   . High cholesterol   . History of hiatal hernia   .  History of kidney stones   . Hypertension    [name], take htn medication to regulate heart beat.  . Meningitis spinal 1967  . Migraine    "all through my life" (11/17/2016)  . Osteoporosis   . Pneumonia    "several times" (11/17/2016)  . 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 Pulmonary embolism ([place]) 2003   occured post c-section of her daughter  . Sleep apnea   . Vitamin D deficiency    Past Surgical History:  Procedure Laterality Date  . APPENDECTOMY    . ATRIAL SEPTAL DEFECT(ASD) CLOSURE  1973  . [place]  . CESAREAN SECTION  2003  . COCHLEAR IMPLANT Right   . ESOPHAGOGASTRODUODENOSCOPY (EGD) WITH PROPOFOL N/A 07/20/2016   Procedure: ESOPHAGOGASTRODUODENOSCOPY (EGD) WITH PROPOFOL;  Surgeon: Gatha Mayer, MD;  Location: [facility];  Service: Endoscopy;  Laterality: N/A;  . EYE MUSCLE SURGERY Bilateral   . INCISION AND DRAINAGE OF WOUND  1993   S/P c-section  . LAPAROSCOPIC CHOLECYSTECTOMY  2012  . MITRAL VALVE REPAIR  05/2005  . TONSILLECTOMY AND ADENOIDECTOMY    . TUBAL LIGATION  1993  . TYMPANOSTOMY TUBE PLACEMENT Bilateral "lots"   Prior to Admission medications   Medication Sig Start Date End Date Taking? Authorizing Provider  acetaminophen (TYLENOL) 325 MG tablet Take 2 tablets (650 mg total) by mouth every 6 (six) hours as needed for mild pain (or Fever >/= 101). 11/20/16   [name], MD  aspirin 81 MG chewable tablet Chew 81 mg by mouth daily.    [provider]  atorvastatin (LIPITOR) 40 MG tablet Take 1 tablet (40 mg total) by mouth daily at 6 PM. 03/09/17   [name], MD  Cholecalciferol (VITAMIN D3) 2000 units TABS Take 2,000 Units by mouth daily.     [provider]  fexofenadine (ALLEGRA ALLERGY) 180 MG tablet Take 180 mg by mouth daily.    [provider]  fluticasone (FLONASE) 50 MCG/ACT  nasal spray Place 2 sprays into both nostrils daily.    [provider]  lipase/protease/amylase (CREON) 36000 UNITS CPEP capsule Take 1 capsule (36,000 Units total) by mouth 3 (three) times daily before meals. 02/16/17   [name], MD  magnesium chloride (SLOW-MAG) 64 MG TBEC SR tablet Take 2 tablets 2 (two) times daily by mouth.    [provider]  Metoclopramide HCl 5 MG TBDP Take 1 tablet (5 mg total) by mouth 4 (four) times daily -  before meals and at bedtime. 02/16/17   [name], MD  metoprolol tartrate (LOPRESSOR) 25 MG tablet Take 1 tablet (25 mg total) by mouth 2 (two) times daily. 03/09/17   [name], MD  mirabegron ER (MYRBETRIQ) 25 MG TB24 tablet Take 1 tablet (25  mg total) by mouth daily. 03/11/17   [name], MD  nitroGLYCERIN (NITROSTAT) 0.4 MG SL tablet Place 1 tablet (0.4 mg total) under the tongue every 5 (five) minutes as needed for chest pain. Patient not taking: Reported on 03/15/2017 05/11/16   [name], MD  Omega-3 Fatty Acids (FISH OIL) 1000 MG CAPS Take 1,000 mg by mouth 2 (two) times daily.     [provider]  omeprazole (PRILOSEC) 40 MG capsule Take 1 capsule (40 mg total) by mouth daily. 01/07/17 02/06/17  Milus Banister, MD  ondansetron (ZOFRAN ODT) 4 MG disintegrating tablet Take 1 tablet (4 mg total) by mouth every 8 (eight) hours as needed for nausea. 11/08/15   [name], MD  prazosin (MINIPRESS) 2 MG capsule Take 2 mg by mouth at bedtime.  02/17/16   [provider]  Prenatal Multivit-Min-Fe-FA (PRENATAL VITAMINS PO) Take 1 tablet by mouth daily.    [provider]  PRESCRIPTION MEDICATION Inhale into the lungs at bedtime. CPAP    [provider]  Vilazodone HCl (VIIBRYD) 40 MG TABS Take 40 mg by mouth daily.    [provider]  vitamin B-12 (CYANOCOBALAMIN) 500 MCG tablet Take 500 mcg by mouth daily.    [provider]   Allergies  Allergen Reactions  . Depakote [Divalproex Sodium]  Anaphylaxis  . Diazepam Other (See Comments)    Died on operating table. TOLERATES ATIVAN AND LIBRIUM W/O DIFFICULTY  . Valium Anaphylaxis    Tolerates Ativan and Librium w/o complication  . Tizanidine Hcl Other (See Comments)    Went into shock with hypotension, AMS, AKI, asp pna after unintentional OD - see hosp 6/11-19/2017.   Family History  Problem Relation Age of Onset  . Breast cancer Mother        bilateral; ages 105 and 65; TAH/BSO ~50  . Depression Sister   . Heart disease Father   . Hypertension Father   . Heart attack Father   . Colon cancer Neg Hx   . Esophageal cancer Neg Hx   . Pancreatic cancer Neg Hx   . Rectal cancer Neg Hx   . Stomach cancer Neg Hx    Social History   Socioeconomic History  . Marital status: Divorced    Spouse name: None  . Number of children: None  . Years of education: None  . Highest education level: None  Social Needs  . Financial resource strain: None  . Food insecurity - worry: None  . Food insecurity - inability: None  . Transportation needs - medical: None  . Transportation needs - non-medical: None  Occupational History  . None  Tobacco Use  . Smoking status: Never Smoker  . Smokeless tobacco: Never Used  Substance and Sexual Activity  . Alcohol use: Yes    Alcohol/week: 0.0 oz    Comment: 11/17/2016 "I'm trying to be a recovering alcoholic; last drink was 1 pint of vodka 1 wk ago"  . Drug use: Yes    Types: Marijuana    Comment: 11/17/2016 "tried marijuana a few times in college"  . Sexual activity: Not Currently    Birth control/protection: Surgical    Comment: 1st intercourse- 19, partners- 6  Other Topics Concern  . None  Social History Narrative   Lives with 77 year old daughter.       Depression screen [facility] 2/9 04/30/2017 03/09/2017 03/04/2017 02/18/2017 02/16/2017  Decreased Interest 0 0 0 0 0  Down, Depressed, Hopeless 0 0 0 0  0  PHQ - 2 Score 0 0 0 0 0  Altered sleeping - - - - -  Tired, decreased energy  - - - - -  Change in appetite - - - - -  Feeling bad or failure about yourself  - - - - -  Trouble concentrating - - - - -  Moving slowly or fidgety/restless - - - - -  Suicidal thoughts - - - - -  PHQ-9 Score - - - - -  Difficult doing work/chores - - - - -  Some recent data might be hidden    Review of Systems  Constitutional: Positive for activity change, appetite change, chills and diaphoresis.  HENT: Positive for hearing loss, rhinorrhea and trouble swallowing.   Endocrine: Positive for cold intolerance, heat intolerance and polyphagia.  Genitourinary: Positive for urgency.  Musculoskeletal: Positive for arthralgias, back pain, myalgias, neck pain and neck stiffness.  Allergic/Immunologic: Positive for environmental allergies.  Neurological: Positive for weakness.  Hematological: Bruises/bleeds easily.  All other systems reviewed and are negative.      Objective:   Physical Exam  Constitutional: She is oriented to person, place, and time. She appears well-developed and well-nourished. No distress.  HENT:  Head: Normocephalic and atraumatic.  Eyes: EOM are normal. Pupils are equal, round, and reactive to light.  Neck: Neck supple.  Cardiovascular: Normal rate.  Pulmonary/Chest: Effort normal. No respiratory distress.  Musculoskeletal: Normal range of motion.  Neurological: She is alert and oriented to person, place, and time.  Skin: Skin is warm and dry.  Psychiatric: She has a normal mood and affect. Her behavior is normal.  Nursing note and vitals reviewed.   BP 114/82   Pulse 75   Temp 98.6 F (37 C)   Resp 16   Ht 5' 3.25" (1.607 m)   Wt 117 lb 12.8 oz (53.4 kg)   SpO2 99%   BMI 20.70 kg/m    Results for orders placed or performed in visit on 04/30/17  POCT urinalysis dipstick  Result Value Ref Range   Color, UA yellow yellow   Clarity, UA clear clear   Glucose, UA negative negative mg/dL   Bilirubin, UA negative negative   Ketones, POC UA negative  negative mg/dL   Spec Grav, UA 1.020 1.010 - 1.025   Blood, UA negative negative   pH, UA 6.0 5.0 - 8.0   Protein Ur, POC negative negative mg/dL   Urobilinogen, UA 0.2 0.2 or 1.0 E.U./dL   Nitrite, UA Negative Negative   Leukocytes, UA Negative Negative  POCT Microscopic Urinalysis ([facility])  Result Value Ref Range   WBC,UR,HPF,POC None None WBC/hpf   RBC,UR,HPF,POC None None RBC/hpf   Bacteria None None, Too numerous to count   Mucus Absent Absent   Epithelial Cells, UR Per Microscopy Few (A) None, Too numerous to count cells/hpf    Visual Acuity Screening   Right eye Left eye Both eyes  Without correction:     With correction: 20/20 20/25 20/20        Assessment & Plan:   1. Annual physical exam - will be due for pap 02/2018 - so do at one of her f/u OV - did not have time today  2. Screening for cardiovascular, respiratory, and genitourinary diseases   3. OAB (overactive bladder) - minimal response to myrbetriq 25mg  so will increase to 50mg  which pt did well on prior  4. Other osteoporosis without current pathological fracture but + H/o vertebral compression fractures -DEXA 4  mos prior at [facility] with T score -2.1 L total femur - had -% change (sig worsening) in B femur necks since 09/19/2014 (only sites that had comparison avail from prior scan).  Received Reclast inj annually x 3 from 08/2012 -> 10/03/2014 after which pt was lost to f/u/busy with numerous hosp for numerous other acute on chronic issues. Her prior gyn Dr. Toney Rakes was managing this but is now retired. Pt does not otherwise need a gynecologist - I will do her pap sometime within the next yr and no h/o any pelvic complications/current concerns - reviewed the various specialties which treat osteoporosis and she would like to see if this is something one of the providers at [facility] could manage as has been seen there by numerous providers over the yrs w/ great experience - perhaps Dr. Mina Marble or Dr. Rip Harbour?  If they do  not, will refer to Dr. Layne Benton at [facility] for further management.  5. Need for varicella vaccine   6. Pancytopenia ([place])   7. Alcohol-induced chronic pancreatitis ([facility]) - cont Creon  8. Hypomagnesemia   9. Hypokalemia   10. Hyperlipidemia, unspecified hyperlipidemia type   11. NSTEMI (non-ST elevated myocardial infarction) ([place]) - secondary to asphyxiation -> acute resp arrest but still on asa, statin, and bb.  12. Esophageal dysphagia - has f/u sched w/ Dr. Ardis Hughs - much improved on current regimen of creon and reglan  13. Oropharyngeal dysphagia   14.    QT prolongation - repeat EKG at f/u visit - may need to decrease reglan (conditional risk) dose from qid to tid or less? Do not increase >5mg .  Poss that increasing dose of Myrbetriq (possible risk) may worsen QT prolongation.  Also on omeprazole which has conditional risk.   NO ZOFRAN - KNOWN RISK OF TdP and will further prolongate QT. [email or web address]/index.[email or web address].    Screening for breast cancer - mammogram at [facility] due 07/2017 - ordered 16.    Severe episode of recurrent major depressive disorder, without psychotic features ([place]) - has been stable on viibrid for sev years with minipress qhs from psych 17.    Alcohol dependence in remission [facility]) - following with Dr. Morley Kos at the [facility] and being more active in online AA mtgs (can't due physical groups due to hearing impairment/lip reading). However, continues to have relapse binges every few weeks despite repeated assurances between hosp - encouraging that she voluntarily checked herself into the last detox/rehab sev wks ago. Discuss at f/u - rec pt try ZINC supplement - up to 40mg  qd for at least sev wks to see if will help cont to stimulate appetite  If varicella ab + -> send in Shingrix rx.    Orders Placed This Encounter  Procedures  . Varicella zoster antibody, IgG  . POCT urinalysis dipstick  . POCT Microscopic Urinalysis ([facility])    Meds  ordered this encounter  Medications  . mirabegron ER (MYRBETRIQ) 50 MG TB24 tablet    Sig: Take 1 tablet (50 mg total) by mouth daily.    Dispense:  90 tablet    Refill:  1    I personally performed the services described in this documentation, which was scribed in my presence. The recorded information has been reviewed and considered, and addended by me as needed.   [name], M.D.  Primary Care at [facility] [address] [place], [place] [address] [phone] phone [phone] fax  04/30/17 7:42 PM

## 2017-04-30 NOTE — Patient Instructions (Addendum)
Make an appointment for your ANNUAL WELLNESS VISIT with [facility].  Your Wellness Visit done will be done yearly at [address] appointment clinic, which is a FREE annual benefit with your insurance. Today we did your physical.  F/u in 3 months for your pap smear.    IF you received an x-ray today, you will receive an invoice from [facility]. Please contact [facility] at [phone] with questions or concerns regarding your invoice.   IF you received labwork today, you will receive an invoice from [facility]. Please contact [facility] at [phone] with questions or concerns regarding your invoice.   Our billing staff will not be able to assist you with questions regarding bills from these companies.  You will be contacted with the lab results as soon as they are available. The fastest way to get your results is to activate your My Chart account. Instructions are located on the last page of this paperwork. If you have not heard from [place] regarding the results in 2 weeks, please contact this office.      Health Maintenance for Postmenopausal Women Menopause is a normal process in which your reproductive ability comes to an end. This process happens gradually over a span of months to years, usually between the ages of 62 and 19. Menopause is complete when you have missed 12 consecutive menstrual periods. It is important to talk with your health care provider about some of the most common conditions that affect postmenopausal women, such as heart disease, cancer, and bone loss (osteoporosis). Adopting a healthy lifestyle and getting preventive care can help to promote your health and wellness. Those actions can also lower your chances of developing some of these common conditions. What should I know about menopause? During menopause, you may experience a number of symptoms, such as:  Moderate-to-severe hot flashes.  Night  sweats.  Decrease in sex drive.  Mood swings.  Headaches.  Tiredness.  Irritability.  Memory problems.  Insomnia.  Choosing to treat or not to treat menopausal changes is an individual decision that you make with your health care provider. What should I know about hormone replacement therapy and supplements? Hormone therapy products are effective for treating symptoms that are associated with menopause, such as hot flashes and night sweats. Hormone replacement carries certain risks, especially as you become older. If you are thinking about using estrogen or estrogen with progestin treatments, discuss the benefits and risks with your health care provider. What should I know about heart disease and stroke? Heart disease, heart attack, and stroke become more likely as you age. This may be due, in part, to the hormonal changes that your body experiences during menopause. These can affect how your body processes dietary fats, triglycerides, and cholesterol. Heart attack and stroke are both medical emergencies. There are many things that you can do to help prevent heart disease and stroke:  Have your blood pressure checked at least every 1-2 years. High blood pressure causes heart disease and increases the risk of stroke.  If you are 80-55 years old, ask your health care provider if you should take aspirin to prevent a heart attack or a stroke.  Do not use any tobacco products, including cigarettes, chewing tobacco, or electronic cigarettes. If you need help quitting, ask your health care provider.  It is important to eat a healthy diet and maintain a healthy weight. ? Be sure to include plenty of vegetables, fruits, low-fat dairy products, and lean  protein. ? Avoid eating foods that are high in solid fats, added sugars, or salt (sodium).  Get regular exercise. This is one of the most important things that you can do for your health. ? Try to exercise for at least 150 minutes each week.  The type of exercise that you do should increase your heart rate and make you sweat. This is known as moderate-intensity exercise. ? Try to do strengthening exercises at least twice each week. Do these in addition to the moderate-intensity exercise.  Know your numbers.Ask your health care provider to check your cholesterol and your blood glucose. Continue to have your blood tested as directed by your health care provider.  What should I know about cancer screening? There are several types of cancer. Take the following steps to reduce your risk and to catch any cancer development as early as possible. Breast Cancer  Practice breast self-awareness. ? This means understanding how your breasts normally appear and feel. ? It also means doing regular breast self-exams. Let your health care provider know about any changes, no matter how small.  If you are 37 or older, have a clinician do a breast exam (clinical breast exam or CBE) every year. Depending on your age, family history, and medical history, it may be recommended that you also have a yearly breast X-ray (mammogram).  If you have a family history of breast cancer, talk with your health care provider about genetic screening.  If you are at high risk for breast cancer, talk with your health care provider about having an MRI and a mammogram every year.  Breast cancer (BRCA) gene test is recommended for women who have family members with BRCA-related cancers. Results of the assessment will determine the need for genetic counseling and BRCA1 and for BRCA2 testing. BRCA-related cancers include these types: ? Breast. This occurs in males or females. ? Ovarian. ? Tubal. This may also be called fallopian tube cancer. ? Cancer of the abdominal or pelvic lining (peritoneal cancer). ? Prostate. ? Pancreatic.  Cervical, Uterine, and Ovarian Cancer Your health care provider may recommend that you be screened regularly for cancer of the pelvic  organs. These include your ovaries, uterus, and vagina. This screening involves a pelvic exam, which includes checking for microscopic changes to the surface of your cervix (Pap test).  For women ages 21-65, health care providers may recommend a pelvic exam and a Pap test every three years. For women ages 72-65, they may recommend the Pap test and pelvic exam, combined with testing for human papilloma virus (HPV), every five years. Some types of HPV increase your risk of cervical cancer. Testing for HPV may also be done on women of any age who have unclear Pap test results.  Other health care providers may not recommend any screening for nonpregnant women who are considered low risk for pelvic cancer and have no symptoms. Ask your health care provider if a screening pelvic exam is right for you.  If you have had past treatment for cervical cancer or a condition that could lead to cancer, you need Pap tests and screening for cancer for at least 20 years after your treatment. If Pap tests have been discontinued for you, your risk factors (such as having a new sexual partner) need to be reassessed to determine if you should start having screenings again. Some women have medical problems that increase the chance of getting cervical cancer. In these cases, your health care provider may recommend that you have screening  and Pap tests more often.  If you have a family history of uterine cancer or ovarian cancer, talk with your health care provider about genetic screening.  If you have vaginal bleeding after reaching menopause, tell your health care provider.  There are currently no reliable tests available to screen for ovarian cancer.  Lung Cancer Lung cancer screening is recommended for adults 37-65 years old who are at high risk for lung cancer because of a history of smoking. A yearly low-dose CT scan of the lungs is recommended if you:  Currently smoke.  Have a history of at least 30 pack-years of  smoking and you currently smoke or have quit within the past 15 years. A pack-year is smoking an average of one pack of cigarettes per day for one year.  Yearly screening should:  Continue until it has been 15 years since you quit.  Stop if you develop a health problem that would prevent you from having lung cancer treatment.  Colorectal Cancer  This type of cancer can be detected and can often be prevented.  Routine colorectal cancer screening usually begins at age 27 and continues through age 15.  If you have risk factors for colon cancer, your health care provider may recommend that you be screened at an earlier age.  If you have a family history of colorectal cancer, talk with your health care provider about genetic screening.  Your health care provider may also recommend using home test kits to check for hidden blood in your stool.  A small camera at the end of a tube can be used to examine your colon directly (sigmoidoscopy or colonoscopy). This is done to check for the earliest forms of colorectal cancer.  Direct examination of the colon should be repeated every 5-10 years until age 59. However, if early forms of precancerous polyps or small growths are found or if you have a family history or genetic risk for colorectal cancer, you may need to be screened more often.  Skin Cancer  Check your skin from head to toe regularly.  Monitor any moles. Be sure to tell your health care provider: ? About any new moles or changes in moles, especially if there is a change in a mole's shape or color. ? If you have a mole that is larger than the size of a pencil eraser.  If any of your family members has a history of skin cancer, especially at a young age, talk with your health care provider about genetic screening.  Always use sunscreen. Apply sunscreen liberally and repeatedly throughout the day.  Whenever you are outside, protect yourself by wearing long sleeves, pants, a wide-brimmed  hat, and sunglasses.  What should I know about osteoporosis? Osteoporosis is a condition in which bone destruction happens more quickly than new bone creation. After menopause, you may be at an increased risk for osteoporosis. To help prevent osteoporosis or the bone fractures that can happen because of osteoporosis, the following is recommended:  If you are 32-50 years old, get at least 1,000 mg of calcium and at least 600 mg of vitamin D per day.  If you are older than age 70 but younger than age 22, get at least 1,200 mg of calcium and at least 600 mg of vitamin D per day.  If you are older than age 14, get at least 1,200 mg of calcium and at least 800 mg of vitamin D per day.  Smoking and excessive alcohol intake increase the risk of  osteoporosis. Eat foods that are rich in calcium and vitamin D, and do weight-bearing exercises several times each week as directed by your health care provider. What should I know about how menopause affects my mental health? Depression may occur at any age, but it is more common as you become older. Common symptoms of depression include:  Low or sad mood.  Changes in sleep patterns.  Changes in appetite or eating patterns.  Feeling an overall lack of motivation or enjoyment of activities that you previously enjoyed.  Frequent crying spells.  Talk with your health care provider if you think that you are experiencing depression. What should I know about immunizations? It is important that you get and maintain your immunizations. These include:  Tetanus, diphtheria, and pertussis (Tdap) booster vaccine.  Influenza every year before the flu season begins.  Pneumonia vaccine.  Shingles vaccine.  Your health care provider may also recommend other immunizations. This information is not intended to replace advice given to you by your health care provider. Make sure you discuss any questions you have with your health care provider. Document Released:  06/18/2005 Document Revised: 11/14/2015 Document Reviewed: 01/28/2015 Elsevier Interactive Patient Education  2018 [employer].  Calcium Intake Recommendations Calcium is a mineral that affects many functions in the body, including:  Blood clotting.  Blood vessel function.  Nerve impulse conduction.  Hormone secretion.  Muscle contraction.  Bone and teeth functions.  Most of your body's calcium supply is stored in your bones and teeth. When your calcium stores are low, you may be at risk for low bone mass, bone loss, and bone fractures. Consuming enough calcium helps to grow healthy bones and teeth and to prevent breakdown over time. It is very important that you get enough calcium if you are:  A child undergoing rapid growth.  An adolescent girl.  A pre- or post-menopausal woman.  A woman whose menstrual cycle has stopped due to anorexia nervosa or regular intense exercise.  An individual with lactose intolerance or a milk allergy.  A vegetarian.  What is my plan? Try to consume the recommended amount of calcium daily based on your age. Depending on your overall health, your health care provider may recommend increased calcium intake.General daily calcium intake recommendations by age are:  Birth to 6 months: 200 mg.  Infants 7 to 12 months: 260 mg.  Children 1 to 3 years: 700 mg.  Children 4 to 8 years: 1,000 mg.  Children 9 to 13 years: 1,300 mg.  Teens 14 to 18 years: 1,300 mg.  Adults 19 to 50 years: 1,000 mg.  Adult women 51 to 70 years: 1,200 mg.  Adult men 51 to 70 years: 1,000 mg.  Adults 71 years and older: 1,200 mg.  Pregnant and breastfeeding teens: 1,300 mg.  Pregnant and breastfeeding adults: 1,000 mg.  What do I need to know about calcium intake?  In order for the body to absorb calcium, it needs vitamin D. You can get vitamin D through: ? Direct exposure of the skin to sunlight. ? Foods, such as egg yolks, liver, saltwater fish, and  fortified milk. ? Supplements.  Consuming too much calcium may cause: ? Constipation. ? Decreased absorption of iron and zinc. ? Kidney stones.  Calcium supplements may interact with certain medicines. Check with your health care provider before starting any calcium supplements.  Try to get most of your calcium from food. What foods can I eat? Grains  Fortified oatmeal. Fortified ready-to-eat cereals. Fortified frozen waffles.  Vegetables Turnip greens. Broccoli. Fruits Fortified orange juice. Meats and Other Protein Sources Canned sardines with bones. Canned salmon with bones. Soy beans. Tofu. Baked beans. Almonds. [place] nuts. Sunflower seeds. Dairy Milk. Yogurt. Cheese. Cottage cheese. Beverages Fortified soy milk. Fortified rice milk. Sweets/Desserts Pudding. Ice Cream. Milkshakes. Blackstrap molasses. The items listed above may not be a complete list of recommended foods or beverages. Contact your dietitian for more options. What foods can affect my calcium intake? It may be more difficult for your body to use calcium or calcium may leave your body more quickly if you consume large amounts of:  Sodium.  Protein.  Caffeine.  Alcohol.  This information is not intended to replace advice given to you by your health care provider. Make sure you discuss any questions you have with your health care provider. Document Released: 12/09/2003 Document Revised: 11/14/2015 Document Reviewed: 10/02/2013 Elsevier Interactive Patient Education  2018 [employer].

## 2017-04-30 NOTE — Addendum Note (Signed)
Addended by: [name] on: 04/30/2017 08:28 PM   Modules accepted: Orders

## 2017-04-30 NOTE — Progress Notes (Deleted)
Subjective:  By signing my name below, I, [name], attest that this documentation has been prepared under the direction and in the presence of [name], MD. Electronically Signed: [name], [place]. 04/30/2017 , 9:19 AM .  Patient was seen in Room 2 .   Patient ID: [name], female    DOB: 06/15/1960, 56 y.o.   MRN: [id number] Chief Complaint  Patient presents with  . Annual Exam   HPI [name] is a 56 y.o. female who presents to Primary Care at [facility] for an annual physical. She will schedule an appointment with [name] for Annual Wellness Visit.   Recent Hospitalization Patient was hospitalized from Dec 2nd to Dec 5th with alcohol withdrawal, with similar occurrences twice in July and once in August earlier of this year. I last saw her 6 weeks prior where she was adamant she was sober.   She had really bad relapse, and was in hospital for detox; has been sober for 20 days now. She states she has more energy now and eating more with larger appetite. She's been going to counseling at the [facility], seen by [name]. She also attends AA online, and also speaking up. Her daughter did move out to her father's during her relapse, but has come back since.   Primary Preventative Screenings: Cervical Cancer: She had a normal pap with negative HPV 02/26/2013, and normal pap 03/30/11 at [facility]; though, chart notes she needs recheck annually, but not sure why.  Family Planning: STI screening: Breast Cancer: done earlier this year.  Colorectal Cancer: Colonoscopy June 2015, Dr. Ardis Hughs; repeat 10 years.  Tobacco use/EtOH/substances: See above regarding recent hospitalization.  Bone Density: She had bone density done recently, but no results back yet. She's had Reclast twice. She's seen [name] orthopedics in the past.  Cardiac: Weight/Blood sugar/Diet/Exercise: She has gained some weight since last visit.  BMI Readings from Last 3 Encounters:  04/30/17 20.70  kg/m  03/15/17 20.21 kg/m  03/09/17 19.63 kg/m   Wt Readings from Last 3 Encounters:  04/30/17 117 lb 12.8 oz (53.4 kg)  03/15/17 115 lb (52.2 kg)  03/09/17 112 lb 9.6 oz (51.1 kg)   Lab Results  Component Value Date   HGBA1C 4.5 (L) 07/17/2016   OTC/Vit/Supp/Herbal: Dentist/Optho: Immunizations:  Immunization History  Administered Date(s) Administered  . Influenza,inj,Quad PF,6+ Mos 02/26/2013  . Tdap 09/03/2015   Flu shot: She felt sick after receiving flu vaccine in the past, feeling sick for 3 days. She will receive flu vaccine at her pharmacy after the holidays.   Chronic Medical Conditions: Urinary frequency: She takes Myrbetriq for urinary urgency and incontinence. She's taken this in the past, working at 50mg ; currently taking 25mg  right now.   Past Medical History:  Diagnosis Date  . Allergy    SEASONAL  . Anemia   . Aortic atherosclerosis ([place])   . Arthritis    "hips, shoulders, hands" (11/17/2016)  . ASD (atrial septal defect)    a. s/p ASD repair in 1973  . CAD (coronary artery disease)    a. 12/2015: NSTEMI occurring after cardiac arrest secondary to aspiration PNA. Echo w/ EF of 55-60%, no WMA. Outpt ischemic eval needed.  . Cardiomyopathy   . Chronic bronchitis ([place])   . Chronic lower back pain   . Deaf    "not totally deaf; read lips and can hear some" (11/17/2016)  . Depression   . Diverticulosis   . Dysphagia    due to  esophageal infections.  . Fibromyalgia   . Gastritis   . GERD (gastroesophageal reflux disease)   . Hearing impairment   . Hepatic steatosis   . High cholesterol   . History of hiatal hernia   . History of kidney stones   . Hypertension    [name], take htn medication to regulate heart beat.  . Meningitis spinal 1967  . Migraine    "all through my life" (11/17/2016)  . Osteoporosis   . Pneumonia    "several times" (11/17/2016)  . PTSD (post-traumatic stress disorder) 12/2015   "S/P esophagus was paralyzed and I choked to  death; they did code blue; brought me back" (11/17/2016)  . Pulmonary embolism ([name]) 2003   occured post c-section of her daughter  . Sleep apnea   . Vitamin D deficiency    Past Surgical History:  Procedure Laterality Date  . APPENDECTOMY    . ATRIAL SEPTAL DEFECT(ASD) CLOSURE  1973  . [place]  . CESAREAN SECTION  2003  . COCHLEAR IMPLANT Right   . ESOPHAGOGASTRODUODENOSCOPY (EGD) WITH PROPOFOL N/A 07/20/2016   Procedure: ESOPHAGOGASTRODUODENOSCOPY (EGD) WITH PROPOFOL;  Surgeon: Gatha Mayer, MD;  Location: [facility];  Service: Endoscopy;  Laterality: N/A;  . EYE MUSCLE SURGERY Bilateral   . INCISION AND DRAINAGE OF WOUND  1993   S/P c-section  . LAPAROSCOPIC CHOLECYSTECTOMY  2012  . MITRAL VALVE REPAIR  05/2005  . TONSILLECTOMY AND ADENOIDECTOMY    . TUBAL LIGATION  1993  . TYMPANOSTOMY TUBE PLACEMENT Bilateral "lots"   Prior to Admission medications   Medication Sig Start Date End Date Taking? Authorizing Provider  acetaminophen (TYLENOL) 325 MG tablet Take 2 tablets (650 mg total) by mouth every 6 (six) hours as needed for mild pain (or Fever >/= 101). 11/20/16   [name], MD  aspirin 81 MG chewable tablet Chew 81 mg by mouth daily.    [provider]  atorvastatin (LIPITOR) 40 MG tablet Take 1 tablet (40 mg total) by mouth daily at 6 PM. 03/09/17   [name], MD  Cholecalciferol (VITAMIN D3) 2000 units TABS Take 2,000 Units by mouth daily.     [provider]  fexofenadine (ALLEGRA ALLERGY) 180 MG tablet Take 180 mg by mouth daily.    [provider]  fluticasone (FLONASE) 50 MCG/ACT nasal spray Place 2 sprays into both nostrils daily.    [provider]  lipase/protease/amylase (CREON) 36000 UNITS CPEP capsule Take 1 capsule (36,000 Units total) by mouth 3 (three) times daily before meals. 02/16/17   [name], MD  magnesium chloride (SLOW-MAG) 64 MG TBEC SR tablet Take 2 tablets 2 (two) times daily by  mouth.    [provider]  Metoclopramide HCl 5 MG TBDP Take 1 tablet (5 mg total) by mouth 4 (four) times daily -  before meals and at bedtime. 02/16/17   [name], MD  metoprolol tartrate (LOPRESSOR) 25 MG tablet Take 1 tablet (25 mg total) by mouth 2 (two) times daily. 03/09/17   [name], MD  mirabegron ER (MYRBETRIQ) 25 MG TB24 tablet Take 1 tablet (25 mg total) by mouth daily. 03/11/17   [name], MD  nitroGLYCERIN (NITROSTAT) 0.4 MG SL tablet Place 1 tablet (0.4 mg total) under the tongue every 5 (five) minutes as needed for chest pain. Patient not taking: Reported on 03/15/2017 05/11/16   [name], MD  Omega-3 Fatty Acids (FISH OIL) 1000 MG CAPS  Take 1,000 mg by mouth 2 (two) times daily.     [provider]  omeprazole (PRILOSEC) 40 MG capsule Take 1 capsule (40 mg total) by mouth daily. 01/07/17 02/06/17  Milus Banister, MD  ondansetron (ZOFRAN ODT) 4 MG disintegrating tablet Take 1 tablet (4 mg total) by mouth every 8 (eight) hours as needed for nausea. 11/08/15   [name], MD  prazosin (MINIPRESS) 2 MG capsule Take 2 mg by mouth at bedtime.  02/17/16   [provider]  Prenatal Multivit-Min-Fe-FA (PRENATAL VITAMINS PO) Take 1 tablet by mouth daily.    [provider]  PRESCRIPTION MEDICATION Inhale into the lungs at bedtime. CPAP    [provider]  Vilazodone HCl (VIIBRYD) 40 MG TABS Take 40 mg by mouth daily.    [provider]  vitamin B-12 (CYANOCOBALAMIN) 500 MCG tablet Take 500 mcg by mouth daily.    [provider]   Allergies  Allergen Reactions  . Depakote [Divalproex Sodium] Anaphylaxis  . Diazepam Other (See Comments)    Died on operating table. TOLERATES ATIVAN AND LIBRIUM W/O DIFFICULTY  . Valium Anaphylaxis    Tolerates Ativan and Librium w/o complication  . Tizanidine Hcl Other (See Comments)    "passed out"   Family History  Problem Relation Age of Onset  . Breast cancer Mother         bilateral; ages 43 and 37; TAH/BSO ~50  . Depression Sister   . Heart disease Father   . Hypertension Father   . Heart attack Father   . Colon cancer Neg Hx   . Esophageal cancer Neg Hx   . Pancreatic cancer Neg Hx   . Rectal cancer Neg Hx   . Stomach cancer Neg Hx    Social History   Socioeconomic History  . Marital status: Divorced    Spouse name: None  . Number of children: None  . Years of education: None  . Highest education level: None  Social Needs  . Financial resource strain: None  . Food insecurity - worry: None  . Food insecurity - inability: None  . Transportation needs - medical: None  . Transportation needs - non-medical: None  Occupational History  . None  Tobacco Use  . Smoking status: Never Smoker  . Smokeless tobacco: Never Used  Substance and Sexual Activity  . Alcohol use: Yes    Alcohol/week: 0.0 oz    Comment: 11/17/2016 "I'm trying to be a recovering alcoholic; last drink was 1 pint of vodka 1 wk ago"  . Drug use: Yes    Types: Marijuana    Comment: 11/17/2016 "tried marijuana a few times in college"  . Sexual activity: Not Currently    Birth control/protection: Surgical    Comment: 1st intercourse- 19, partners- 6  Other Topics Concern  . None  Social History Narrative   Lives with 48 year old daughter.       Depression screen [facility]  2/9 04/30/2017 03/09/2017 03/04/2017 02/18/2017 02/16/2017  Decreased Interest 0 0 0 0 0  Down, Depressed, Hopeless 0 0 0 0 0  PHQ - 2 Score 0 0 0 0 0  Altered sleeping - - - - -  Tired, decreased energy - - - - -  Change in appetite - - - - -  Feeling bad or failure about yourself  - - - - -  Trouble concentrating - - - - -  Moving slowly or fidgety/restless - - - - -  Suicidal thoughts - - - - -  PHQ-9 Score - - - - -  Difficult doing work/chores - - - - -  Some recent data might be hidden    Review of Systems  Constitutional: Positive for activity change, appetite change, chills and diaphoresis.  HENT:  Positive for hearing loss, rhinorrhea and trouble swallowing.   Endocrine: Positive for cold intolerance, heat intolerance and polyphagia.  Genitourinary: Positive for urgency.  Musculoskeletal: Positive for arthralgias, back pain, myalgias, neck pain and neck stiffness.  Allergic/Immunologic: Positive for environmental allergies.  Neurological: Positive for weakness.  Hematological: Bruises/bleeds easily.  All other systems reviewed and are negative.      Objective:   Physical Exam  Constitutional: She is oriented to person, place, and time. She appears well-developed and well-nourished. No distress.  HENT:  Head: Normocephalic and atraumatic.  Eyes: EOM are normal. Pupils are equal, round, and reactive to light.  Neck: Neck supple.  Cardiovascular: Normal rate.  Pulmonary/Chest: Effort normal. No respiratory distress.  Musculoskeletal: Normal range of motion.  Neurological: She is alert and oriented to person, place, and time.  Skin: Skin is warm and dry.  Psychiatric: She has a normal mood and affect. Her behavior is normal.  Nursing note and vitals reviewed.   BP 114/82   Pulse 75   Temp 98.6 F (37 C)   Resp 16   Ht 5' 3.25" (1.607 m)   Wt 117 lb 12.8 oz (53.4 kg)   SpO2 99%   BMI 20.70 kg/m       Assessment & Plan:

## 2017-05-02 NOTE — Progress Notes (Signed)
I called [facility] [phone] and they are closed for the Holidays. Will try again Wednesday

## 2017-05-04 ENCOUNTER — Encounter (HOSPITAL_COMMUNITY)
Admission: EM | Disposition: A | Discharge status: Home / Self Care | Payer: Self-pay | Source: Home / Self Care | Attending: Emergency Medicine

## 2017-05-04 ENCOUNTER — Emergency Department (HOSPITAL_COMMUNITY)
Admission: EM | Admit: 2017-05-04 | Discharge: 2017-05-04 | Disposition: A | Discharge status: Home / Self Care | Payer: Medicare HMO | Attending: Emergency Medicine | Admitting: Emergency Medicine

## 2017-05-04 ENCOUNTER — Encounter (HOSPITAL_COMMUNITY): Payer: Self-pay | Admitting: Family Medicine

## 2017-05-04 DIAGNOSIS — X58XXXA Exposure to other specified factors, initial encounter: Secondary | ICD-10-CM | POA: Insufficient documentation

## 2017-05-04 DIAGNOSIS — Y9389 Activity, other specified: Secondary | ICD-10-CM | POA: Insufficient documentation

## 2017-05-04 DIAGNOSIS — K222 Esophageal obstruction: Secondary | ICD-10-CM | POA: Diagnosis present

## 2017-05-04 DIAGNOSIS — Z7982 Long term (current) use of aspirin: Secondary | ICD-10-CM | POA: Diagnosis not present

## 2017-05-04 DIAGNOSIS — Y929 Unspecified place or not applicable: Secondary | ICD-10-CM | POA: Insufficient documentation

## 2017-05-04 DIAGNOSIS — I2510 Atherosclerotic heart disease of native coronary artery without angina pectoris: Secondary | ICD-10-CM | POA: Diagnosis not present

## 2017-05-04 DIAGNOSIS — Z79899 Other long term (current) drug therapy: Secondary | ICD-10-CM | POA: Insufficient documentation

## 2017-05-04 DIAGNOSIS — I10 Essential (primary) hypertension: Secondary | ICD-10-CM | POA: Diagnosis not present

## 2017-05-04 DIAGNOSIS — Y999 Unspecified external cause status: Secondary | ICD-10-CM | POA: Insufficient documentation

## 2017-05-04 DIAGNOSIS — T18128A Food in esophagus causing other injury, initial encounter: Secondary | ICD-10-CM | POA: Insufficient documentation

## 2017-05-04 LAB — BASIC METABOLIC PANEL
Anion gap: 7 (ref 5–15)
BUN: 19 mg/dL (ref 6–20)
CALCIUM: 9 mg/dL (ref 8.9–10.3)
CO2: 25 mmol/L (ref 22–32)
CREATININE: 0.71 mg/dL (ref 0.44–1.00)
Chloride: 109 mmol/L (ref 101–111)
GFR calc non Af Amer: >60 mL/min (ref >60)
Glucose, Bld: 101 mg/dL — ABNORMAL HIGH (ref 65–99)
Potassium: 4.1 mmol/L (ref 3.5–5.1)
SODIUM: 141 mmol/L (ref 135–145)

## 2017-05-04 LAB — PANCREATIC ELASTASE, FECAL: Pancreatic Elastase, Fecal: >500 ug Elast./g (ref >200)

## 2017-05-04 LAB — CBC WITH DIFFERENTIAL/PLATELET
Basophils Absolute: 0.1 10*3/uL (ref 0.0–0.1)
Basophils Relative: 2 %
Eosinophils Absolute: 0.3 10*3/uL (ref 0.0–0.7)
Eosinophils Relative: 8 %
HCT: 39.7 % (ref 36.0–46.0)
HEMOGLOBIN: 13.3 g/dL (ref 12.0–15.0)
LYMPHS ABS: 0.8 10*3/uL (ref 0.7–4.0)
LYMPHS PCT: 24 %
MCH: 32.8 pg (ref 26.0–34.0)
MCHC: 33.5 g/dL (ref 30.0–36.0)
MCV: 97.8 fL (ref 78.0–100.0)
Monocytes Absolute: 0.3 10*3/uL (ref 0.1–1.0)
Monocytes Relative: 9 %
NEUTROS ABS: 2.1 10*3/uL (ref 1.7–7.7)
NEUTROS PCT: 57 %
Platelets: 248 10*3/uL (ref 150–400)
RBC: 4.06 MIL/uL (ref 3.87–5.11)
RDW: 14.5 % (ref 11.5–15.5)
WBC: 3.6 10*3/uL — AB (ref 4.0–10.5)

## 2017-05-04 SURGERY — EGD (ESOPHAGOGASTRODUODENOSCOPY)
Anesthesia: Moderate Sedation

## 2017-05-04 MED ORDER — FENTANYL CITRATE (PF) 100 MCG/2ML IJ SOLN
INTRAMUSCULAR | Status: AC
  Filled 2017-05-04: qty 4

## 2017-05-04 MED ORDER — MIDAZOLAM HCL 5 MG/ML IJ SOLN
INTRAMUSCULAR | Status: AC
  Filled 2017-05-04: qty 4

## 2017-05-04 MED ORDER — GLUCAGON HCL RDNA (DIAGNOSTIC) 1 MG IJ SOLR
1.0000 mg | Freq: Once | INTRAMUSCULAR | Status: AC
  Administered 2017-05-04: 1 mg via INTRAVENOUS
  Filled 2017-05-04: qty 1

## 2017-05-04 MED ORDER — MORPHINE SULFATE (PF) 4 MG/ML IV SOLN
4.0000 mg | Freq: Once | INTRAVENOUS | Status: AC
  Administered 2017-05-04: 4 mg via INTRAVENOUS
  Filled 2017-05-04: qty 1

## 2017-05-04 NOTE — ED Notes (Signed)
Pt will have periods of vomiting saliva. She notes a pressure in her esophagus. The pt is belching regularly also.

## 2017-05-04 NOTE — ED Notes (Signed)
Lab redraw, light green d/t hemolyzed.  10 mL drawn from SL & discarded prior to obtaining specimen.

## 2017-05-04 NOTE — ED Provider Notes (Addendum)
[facility] DEPT Provider Note   CSN: [id number] Arrival date & time: 05/04/17  0221     History   Chief Complaint Chief Complaint  Patient presents with  . Swallowed Foreign Body    HPI [name] is a 56 y.o. female.  The history is provided by the patient.  She has history of an esophagus that does not function well, and comes in with food stuck in her esophagus.  She ate some chicken about 7 PM, and it has not passed to her esophagus.  She did vomit several times at home, but has been unable to swallow water since this episode.  This is happened several times in the past and has required endoscopy.  Past history also includes coronary artery disease, cardiomyopathy, hypertension, hyperlipidemia, alcohol abuse, depression, hearing impaired, esophageal candidiasis  Past Medical History:  Diagnosis Date  . Accidental drug overdose 10/26/2015  . Acute respiratory failure with hypoxia ([place]) 10/23/2015  . Allergy    SEASONAL  . Anemia   . Anemia, iron deficiency 03/29/2014  . Aortic atherosclerosis ([place])   . Arthritis    "hips, shoulders, hands" (11/17/2016)  . ASD (atrial septal defect)    a. s/p ASD repair in 1973  . Aspiration pneumonia of right lower lobe due to gastric secretions ([place])   . Bronchitis 09/20/2013  . CAD (coronary artery disease)    a. 12/2015: NSTEMI occurring after cardiac arrest secondary to aspiration PNA. Echo w/ EF of 55-60%, no WMA. Outpt ischemic eval needed.  . Cardiac arrest ([place]) 12/22/2015  . Cardiomyopathy   . Chronic bronchitis ([place])   . Chronic lower back pain   . Deaf    "not totally deaf; read lips and can hear some" (11/17/2016)  . Depression   . Diverticulosis   . Dysphagia    due to esophageal infections.  . Fibromyalgia   . Gastritis   . GERD (gastroesophageal reflux disease)   . Hearing impairment   . Hepatic steatosis   . High cholesterol   . History of hiatal hernia   . History of kidney  stones   . Hypertension    [name], take htn medication to regulate heart beat.  . Increased anion gap metabolic acidosis 09/03/621  . Lactic acidosis 12/17/2015  . Major depressive disorder, recurrent episode, moderate ([place]) 09/18/2014  . Meningitis spinal 1967  . Migraine    "all through my life" (11/17/2016)  . Nausea & vomiting 05/20/2014  . Neurocardiogenic syncope 07/19/2016  . Osteoporosis   . Pancytopenia ([place])   . PMB (postmenopausal bleeding) 02/26/2013  . Pneumonia    "several times" (11/17/2016)  . Pressure ulcer 12/18/2015  . 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 Pulmonary embolism ([place]) 2003   occured post c-section of her daughter  . Shock ([place]) 10/19/2015  . Sleep apnea   . Starvation ketoacidosis 12/17/2015  . Vitamin D deficiency     Patient Active Problem List   Diagnosis Date Noted  . Chronic pancreatitis due to acute alcohol intoxication ([place]) 11/17/2016  . Candida infection, esophageal ([place])   . Esophageal ring, acquired   . Abdominal pain, epigastric   . Bradycardia   . Hypokalemia   . NSTEMI (non-ST elevated myocardial infarction) ([place]) 12/19/2015  . Typical atrial flutter ([place])   . Protein-calorie malnutrition, severe 12/18/2015  . Alcohol abuse 12/17/2015  . Essential hypertension  12/06/2015  . Non-traumatic compression fracture of vertebral column with routine healing 07/17/2014  . Osteoporosis 07/17/2014  . Odynophagia 07/09/2014  . Esophageal dysphagia 07/08/2014  . Chest pain 05/20/2014  . 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 Hypercholesterolemia without hypertriglyceridemia 03/29/2014  . Fatigue 03/29/2014  . Classical migraine with intractable migraine 03/29/2014  . Disorder of mitral valve 03/29/2014  . Atypical migraine 03/29/2014  . Atrial septal defect of fossa ovalis 03/29/2014  .  Arthralgia of multiple joints 03/29/2014  . Fibrositis 03/29/2014  . Abdominal pain 12/20/2013  . Absolute anemia 09/20/2013  . Family history of breast cancer 07/13/2013  . Palpitation 07/02/2013  . History of mitral valve repair 07/02/2013  . Status post patch closure of ASD 07/02/2013  . Hyperlipidemia 07/02/2013  . OAB (overactive bladder) 06/13/2013  . Ovarian cyst, left 03/15/2013  . Vaginal lesion 03/15/2013  . Ovarian mass 03/15/2013  . Maternal DVT (deep vein thrombosis), history of 02/26/2013  . Alcohol dependence ([place]) 11/07/2012  . Unspecified vitamin D deficiency 08/17/2012  . L1 vertebral fracture ([place]) 08/01/2012  . Spinal compression fracture ([place]) 07/12/2012  . Menopausal state 07/12/2012  . Psoriasis 07/12/2012  . Severe episode of recurrent major depressive disorder ([place]) 12/07/2011  . Cardiomyopathy 03/16/2011  . Hearing impairment   . Arthritis     Past Surgical History:  Procedure Laterality Date  . APPENDECTOMY    . ATRIAL SEPTAL DEFECT(ASD) CLOSURE  1973  . [place]  . CESAREAN SECTION  2003  . COCHLEAR IMPLANT Right   . ESOPHAGOGASTRODUODENOSCOPY (EGD) WITH PROPOFOL N/A 07/20/2016   Procedure: ESOPHAGOGASTRODUODENOSCOPY (EGD) WITH PROPOFOL;  Surgeon: Gatha Mayer, MD;  Location: [facility];  Service: Endoscopy;  Laterality: N/A;  . EYE MUSCLE SURGERY Bilateral   . INCISION AND DRAINAGE OF WOUND  1993   S/P c-section  . LAPAROSCOPIC CHOLECYSTECTOMY  2012  . MITRAL VALVE REPAIR  05/2005  . TONSILLECTOMY AND ADENOIDECTOMY    . TUBAL LIGATION  1993  . TYMPANOSTOMY TUBE PLACEMENT Bilateral "lots"    OB History    Gravida Para Term Preterm AB Living   2 2 2  0 0     SAB TAB Ectopic Multiple Live Births   0 0 0           Home Medications    Prior to Admission medications   Medication Sig Start Date End Date Taking? Authorizing Provider  acetaminophen (TYLENOL) 325 MG tablet Take 2 tablets (650 mg total) by mouth every  6 (six) hours as needed for mild pain (or Fever >/= 101). 11/20/16   [name], MD  aspirin 81 MG chewable tablet Chew 81 mg by mouth daily.    [provider]  atorvastatin (LIPITOR) 40 MG tablet Take 1 tablet (40 mg total) by mouth daily at 6 PM. 03/09/17   [name], MD  Cholecalciferol (VITAMIN D3) 2000 units TABS Take 2,000 Units by mouth daily.     [provider]  fexofenadine (ALLEGRA ALLERGY) 180 MG tablet Take 180 mg by mouth daily.    [provider]  fluticasone (FLONASE) 50 MCG/ACT nasal spray Place 2 sprays into both nostrils daily.    [provider]  lipase/protease/amylase (CREON) 36000 UNITS CPEP capsule Take 1 capsule (36,000 Units total) by mouth 3 (three) times daily before meals. 02/16/17   [name], MD  magnesium chloride (SLOW-MAG) 64 MG TBEC SR tablet Take 2 tablets 2 (two) times daily by mouth.  [provider]  Metoclopramide HCl 5 MG TBDP Take 1 tablet (5 mg total) by mouth 4 (four) times daily -  before meals and at bedtime. 02/16/17   [name], MD  metoprolol tartrate (LOPRESSOR) 25 MG tablet Take 1 tablet (25 mg total) by mouth 2 (two) times daily. 03/09/17   [name], MD  mirabegron ER (MYRBETRIQ) 50 MG TB24 tablet Take 1 tablet (50 mg total) by mouth daily. 04/30/17   [name], MD  nitroGLYCERIN (NITROSTAT) 0.4 MG SL tablet Place 1 tablet (0.4 mg total) under the tongue every 5 (five) minutes as needed for chest pain. 05/11/16   [name], MD  Omega-3 Fatty Acids (FISH OIL) 1000 MG CAPS Take 1,000 mg by mouth 2 (two) times daily.     [provider]  omeprazole (PRILOSEC) 40 MG capsule Take 1 capsule (40 mg total) by mouth daily. 01/07/17 02/06/17  Milus Banister, MD  prazosin (MINIPRESS) 2 MG capsule Take 2 mg by mouth at bedtime.  02/17/16   [provider]  Prenatal Multivit-Min-Fe-FA (PRENATAL VITAMINS PO) Take 1 tablet by mouth daily.    [provider]  PRESCRIPTION  MEDICATION Inhale into the lungs at bedtime. CPAP    [provider]  Vilazodone HCl (VIIBRYD) 40 MG TABS Take 40 mg by mouth daily.    [provider]  vitamin B-12 (CYANOCOBALAMIN) 500 MCG tablet Take 500 mcg by mouth daily.    [provider]    Family History Family History  Problem Relation Age of Onset  . Breast cancer Mother        bilateral; ages 44 and 43; TAH/BSO ~50  . Depression Sister   . Heart disease Father   . Hypertension Father   . Heart attack Father   . Colon cancer Neg Hx   . Esophageal cancer Neg Hx   . Pancreatic cancer Neg Hx   . Rectal cancer Neg Hx   . Stomach cancer Neg Hx     Social History Social History   Tobacco Use  . Smoking status: Never Smoker  . Smokeless tobacco: Never Used  Substance Use Topics  . Alcohol use: Yes    Alcohol/week: 0.0 oz    Comment: Wine tonight with dinner .  [name] Drug use: Yes    Types: Marijuana    Comment: "tried marijuana a few times in college"     Allergies   Depakote [divalproex sodium]; Diazepam; Valium; and Tizanidine hcl   Review of Systems Review of Systems  All other systems reviewed and are negative.    Physical Exam Updated Vital Signs BP (!) 147/85 (BP Location: Right Arm)   Pulse 92   Temp 99 F (37.2 C)   Resp 18   Ht 5\' 3"  (1.6 m)   Wt 53.1 kg (117 lb)   SpO2 100%   BMI 20.73 kg/m   Physical Exam  Nursing note and vitals reviewed.  56 year old female, resting comfortably and in no acute distress. Vital signs are significant for hypertension. Oxygen saturation is 100%, which is normal. Head is normocephalic and atraumatic. PERRLA, EOMI. Oropharynx is clear.  Edentulous. Neck is nontender and supple without adenopathy or JVD. Back is nontender and there is no CVA tenderness. Lungs are clear without rales, wheezes, or rhonchi. Chest is nontender. Heart has regular rate and rhythm without murmur. Abdomen is soft, flat, nontender without masses or  hepatosplenomegaly and peristalsis is normoactive. Extremities have no cyanosis  or edema, full range of motion is present. Skin is warm and dry without rash. Neurologic: Mental status is normal, cranial nerves are intact, there are no motor or sensory deficits.  ED Treatments / Results  Labs (all labs ordered are listed, but only abnormal results are displayed) Labs Reviewed  CBC WITH DIFFERENTIAL/PLATELET - Abnormal; Notable for the following components:      Result Value   WBC 3.6 (*)    All other components within normal limits  BASIC METABOLIC PANEL   Procedures Procedures (including critical care time)  Medications Ordered in ED Medications  morphine 4 MG/ML injection 4 mg (not administered)  glucagon (human recombinant) (GLUCAGEN) injection 1 mg (1 mg Intravenous Given 05/04/17 0313)     Initial Impression / Assessment and Plan / ED Course  I have reviewed the triage vital signs and the nursing notes.  Pertinent labs & imaging results that were available during my care of the patient were reviewed by me and considered in my medical decision making (see chart for details).  Esophageal obstruction from chicken.  Given her history, she is unlikely to respond to glucagon.  Will need GI consultation.  Old records are reviewed confirming GI following her for esophageal problems, but I do not see prior episodes of esophageal obstruction.  She did have dilatation of a Schatzki's ring in March, endoscopy last month showed Candida esophagitis.  Case is discussed with Dr. Loletha Carrow gastroenterology service states she will come into perform endoscopy.  Final Clinical Impressions(s) / ED Diagnoses   Final diagnoses:  Esophageal obstruction due to food impaction    ED Discharge Orders    None       Delora Fuel, MD 34/03/52 [phone]  While waiting for endoscopy, patient felt as if her obstruction loosened.  She was then able to swallow liquids without difficulty.  She no longer needs  urgent endoscopy.  She has a follow-up appointment scheduled with her gastroenterologist in the next week, and she is to keep that appointment.   Delora Fuel, MD 59/09/31 [phone]

## 2017-05-04 NOTE — ED Notes (Signed)
Pt was provided a cup of water with [place].D consent. She was able to swallow and keep the water down for over 30 minutes. M.D notified.

## 2017-05-04 NOTE — ED Triage Notes (Signed)
Patient reports she is experiencing having chicken stuck in her throat and unable to swallow her salvia. Patient is able to talk but intermittently she spits up in an emesis bag. Patient reports she has had to have endoscopy to remove the foreign food.

## 2017-05-04 NOTE — Discharge Instructions (Signed)
Make sur to cut your food (especially meat) into small pieces, and chew it thoroughly.

## 2017-05-10 ENCOUNTER — Other Ambulatory Visit: Payer: Self-pay

## 2017-05-10 ENCOUNTER — Encounter (HOSPITAL_COMMUNITY): Payer: Self-pay

## 2017-05-10 ENCOUNTER — Emergency Department (HOSPITAL_COMMUNITY)
Admission: EM | Admit: 2017-05-10 | Discharge: 2017-05-10 | Disposition: A | Discharge status: Home / Self Care | Payer: Medicare HMO | Attending: Emergency Medicine | Admitting: Emergency Medicine

## 2017-05-10 ENCOUNTER — Emergency Department (HOSPITAL_COMMUNITY): Payer: Medicare HMO

## 2017-05-10 DIAGNOSIS — R0789 Other chest pain: Secondary | ICD-10-CM | POA: Insufficient documentation

## 2017-05-10 DIAGNOSIS — R070 Pain in throat: Secondary | ICD-10-CM | POA: Insufficient documentation

## 2017-05-10 DIAGNOSIS — I10 Essential (primary) hypertension: Secondary | ICD-10-CM | POA: Diagnosis not present

## 2017-05-10 DIAGNOSIS — Z7982 Long term (current) use of aspirin: Secondary | ICD-10-CM | POA: Insufficient documentation

## 2017-05-10 DIAGNOSIS — I2510 Atherosclerotic heart disease of native coronary artery without angina pectoris: Secondary | ICD-10-CM | POA: Insufficient documentation

## 2017-05-10 DIAGNOSIS — Z79899 Other long term (current) drug therapy: Secondary | ICD-10-CM | POA: Diagnosis not present

## 2017-05-10 DIAGNOSIS — R079 Chest pain, unspecified: Secondary | ICD-10-CM | POA: Diagnosis not present

## 2017-05-10 DIAGNOSIS — R110 Nausea: Secondary | ICD-10-CM | POA: Diagnosis not present

## 2017-05-10 LAB — BASIC METABOLIC PANEL
ANION GAP: 10 (ref 5–15)
BUN: 9 mg/dL (ref 6–20)
CHLORIDE: 104 mmol/L (ref 101–111)
CO2: 26 mmol/L (ref 22–32)
CREATININE: 0.69 mg/dL (ref 0.44–1.00)
Calcium: 9 mg/dL (ref 8.9–10.3)
GFR calc non Af Amer: >60 mL/min (ref >60)
Glucose, Bld: 125 mg/dL — ABNORMAL HIGH (ref 65–99)
Potassium: 4.2 mmol/L (ref 3.5–5.1)
SODIUM: 140 mmol/L (ref 135–145)

## 2017-05-10 LAB — I-STAT TROPONIN, ED
TROPONIN I, POC: 0 ng/mL (ref 0.00–0.08)
TROPONIN I, POC: 0.01 ng/mL (ref 0.00–0.08)

## 2017-05-10 LAB — CBC
HCT: 36.8 % (ref 36.0–46.0)
HEMOGLOBIN: 12.4 g/dL (ref 12.0–15.0)
MCH: 32 pg (ref 26.0–34.0)
MCHC: 33.7 g/dL (ref 30.0–36.0)
MCV: 94.8 fL (ref 78.0–100.0)
PLATELETS: 157 10*3/uL (ref 150–400)
RBC: 3.88 MIL/uL (ref 3.87–5.11)
RDW: 14.3 % (ref 11.5–15.5)
WBC: 3 10*3/uL — AB (ref 4.0–10.5)

## 2017-05-10 MED ORDER — ONDANSETRON HCL 4 MG/2ML IJ SOLN: 4.0000 mg | Freq: Once | INTRAMUSCULAR | Status: DC

## 2017-05-10 MED ORDER — MORPHINE SULFATE (PF) 4 MG/ML IV SOLN
4.0000 mg | Freq: Once | INTRAVENOUS | Status: AC
  Administered 2017-05-10: 4 mg via INTRAVENOUS
  Filled 2017-05-10: qty 1

## 2017-05-10 MED ORDER — GI COCKTAIL ~~LOC~~
30.0000 mL | Freq: Once | ORAL | Status: AC
  Administered 2017-05-10: 30 mL via ORAL
  Filled 2017-05-10: qty 30

## 2017-05-10 MED ORDER — KETOROLAC TROMETHAMINE 30 MG/ML IJ SOLN
15.0000 mg | Freq: Once | INTRAMUSCULAR | Status: AC
Start: 2017-05-10 — End: 2017-05-10
  Administered 2017-05-10: 15 mg via INTRAVENOUS
  Filled 2017-05-10: qty 1

## 2017-05-10 NOTE — Discharge Instructions (Signed)
It was my pleasure taking care of you today!   Tylenol as needed for pain.   It is very important that you follow up with your GI doctor and the cardiology clinic.   Return to ER for chest pain worse with exertion, new or worsening symptoms, any additional concerns.

## 2017-05-10 NOTE — ED Triage Notes (Addendum)
Pt arrives EMS from home with initial c/o chest pain but states her pain is art base of throat. States she :"does not have a functioning esophagus" Pt had hx of MI x 2. Took 2 81 aspirin before EMS arrived. Given 2 81 mg Aspirin by EMS. Given Nausea meds by EMS  Zofran 4 mg iv. Pt stats she started with nausea then throat pain. Had episode of chest pain that went to her back but not now.

## 2017-05-10 NOTE — ED Provider Notes (Signed)
[facility] EMERGENCY DEPARTMENT Provider Note   CSN: [id number] Arrival date & time: 05/10/17  1054     History   Chief Complaint Chief Complaint  Patient presents with  . Chest Pain    HPI [name] is a 57 y.o. female.  The history is provided by the patient and medical records. No language interpreter was used.  Chest Pain   Associated symptoms include nausea. Pertinent negatives include no abdominal pain, no diaphoresis, no fever, no palpitations and no vomiting.   [name] is a 57 y.o. female  with a PMH of prior NSTEMI and cardiac arrest in 2017 which were in the setting of aspiration pneumonia, dysphasia with recurrent esophageal infections, who presents to the Emergency Department complaining of throat and chest pain which began tonight.  Patient states that when she awoke this morning, she developed central nonradiating chest pain.  About 30-45 minutes after that, the pain began radiating to her throat area.  She is now states her chest pain has resolved, but is having persistent pain to the throat.  She took 2 baby aspirin prior to arrival.  She also was given 2 more 81 mg aspirin along with 4 mg of Zofran by EMS prior to arrival.  She feels as if this may be more related to her esophagus, but since she had the pain in her chest earlier, she is unsure. No fever, chills, vomiting, abdominal pain or back pain.  No alleviating or aggravating factors noted.  Past Medical History:  Diagnosis Date  . Accidental drug overdose 10/26/2015  . Acute respiratory failure with hypoxia ([place]) 10/23/2015  . Allergy    SEASONAL  . Anemia   . Anemia, iron deficiency 03/29/2014  . Aortic atherosclerosis ([place])   . Arthritis    "hips, shoulders, hands" (11/17/2016)  . ASD (atrial septal defect)    a. s/p ASD repair in 1973  . Aspiration pneumonia of right lower lobe due to gastric secretions ([place])   . Bronchitis 09/20/2013  . CAD (coronary artery disease)    a.  12/2015: NSTEMI occurring after cardiac arrest secondary to aspiration PNA. Echo w/ EF of 55-60%, no WMA. Outpt ischemic eval needed.  . Cardiac arrest ([place]) 12/22/2015  . Cardiomyopathy   . Chronic bronchitis ([place])   . Chronic lower back pain   . Deaf    "not totally deaf; read lips and can hear some" (11/17/2016)  . Depression   . Diverticulosis   . Dysphagia    due to esophageal infections.  . Fibromyalgia   . Gastritis   . GERD (gastroesophageal reflux disease)   . Hearing impairment   . Hepatic steatosis   . High cholesterol   . History of hiatal hernia   . History of kidney stones   . Hypertension    [name], take htn medication to regulate heart beat.  . Increased anion gap metabolic acidosis 10/31/7626  . Lactic acidosis 12/17/2015  . Major depressive disorder, recurrent episode, moderate ([place]) 09/18/2014  . Meningitis spinal 1967  . Migraine    "all through my life" (11/17/2016)  . Nausea & vomiting 05/20/2014  . Neurocardiogenic syncope 07/19/2016  . Osteoporosis   . Pancytopenia ([place])   . PMB (postmenopausal bleeding) 02/26/2013  . Pneumonia    "several times" (11/17/2016)  . Pressure ulcer 12/18/2015  . 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  Pulmonary embolism ([place]) 2003   occured post c-section of her daughter  . Shock ([place]) 10/19/2015  . Sleep apnea   . Starvation ketoacidosis 12/17/2015  . Vitamin D deficiency     Patient Active Problem List   Diagnosis Date Noted  . Chronic pancreatitis due to acute alcohol intoxication ([place]) 11/17/2016  . Candida infection, esophageal ([place])   . Esophageal ring, acquired   . Abdominal pain, epigastric   . Bradycardia   . Hypokalemia   . NSTEMI (non-ST elevated myocardial infarction) ([place]) 12/19/2015  . Typical atrial flutter ([place])   . Protein-calorie malnutrition, severe 12/18/2015  . Alcohol abuse 12/17/2015  .  Essential hypertension 12/06/2015  . Non-traumatic compression fracture of vertebral column with routine healing 07/17/2014  . Osteoporosis 07/17/2014  . Odynophagia 07/09/2014  . Esophageal dysphagia 07/08/2014  . Chest pain 05/20/2014  . 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 Hypercholesterolemia without hypertriglyceridemia 03/29/2014  . Fatigue 03/29/2014  . Classical migraine with intractable migraine 03/29/2014  . Disorder of mitral valve 03/29/2014  . Atypical migraine 03/29/2014  . Atrial septal defect of fossa ovalis 03/29/2014  . Arthralgia of multiple joints 03/29/2014  . Fibrositis 03/29/2014  . Abdominal pain 12/20/2013  . Absolute anemia 09/20/2013  . Family history of breast cancer 07/13/2013  . Palpitation 07/02/2013  . History of mitral valve repair 07/02/2013  . Status post patch closure of ASD 07/02/2013  . Hyperlipidemia 07/02/2013  . OAB (overactive bladder) 06/13/2013  . Ovarian cyst, left 03/15/2013  . Vaginal lesion 03/15/2013  . Ovarian mass 03/15/2013  . Maternal DVT (deep vein thrombosis), history of 02/26/2013  . Alcohol dependence ([place]) 11/07/2012  . Unspecified vitamin D deficiency 08/17/2012  . L1 vertebral fracture ([place]) 08/01/2012  . Spinal compression fracture ([place]) 07/12/2012  . Menopausal state 07/12/2012  . Psoriasis 07/12/2012  . Severe episode of recurrent major depressive disorder ([place]) 12/07/2011  . Cardiomyopathy 03/16/2011  . Hearing impairment   . Arthritis     Past Surgical History:  Procedure Laterality Date  . APPENDECTOMY    . ATRIAL SEPTAL DEFECT(ASD) CLOSURE  1973  . [place]  . CESAREAN SECTION  2003  . COCHLEAR IMPLANT Right   . ESOPHAGOGASTRODUODENOSCOPY (EGD) WITH PROPOFOL N/A 07/20/2016   Procedure: ESOPHAGOGASTRODUODENOSCOPY (EGD) WITH PROPOFOL;  Surgeon: Gatha Mayer, MD;  Location: [facility];  Service: Endoscopy;  Laterality: N/A;  . EYE MUSCLE  SURGERY Bilateral   . INCISION AND DRAINAGE OF WOUND  1993   S/P c-section  . LAPAROSCOPIC CHOLECYSTECTOMY  2012  . MITRAL VALVE REPAIR  05/2005  . TONSILLECTOMY AND ADENOIDECTOMY    . TUBAL LIGATION  1993  . TYMPANOSTOMY TUBE PLACEMENT Bilateral "lots"    OB History    Gravida Para Term Preterm AB Living   2 2 2  0 0     SAB TAB Ectopic Multiple Live Births   0 0 0           Home Medications    Prior to Admission medications   Medication Sig Start Date End Date Taking? Authorizing Provider  acetaminophen (TYLENOL) 325 MG tablet Take 2 tablets (650 mg total) by mouth every 6 (six) hours as needed for mild pain (or Fever >/= 101). 11/20/16  Yes Buriev, Arie Sabina, MD  Ascorbic Acid (VITAMIN C) 1000 MG tablet Take 1,000 mg by mouth daily.   Yes [provider]  aspirin 81 MG chewable tablet Chew 81 mg by mouth daily.  Yes [provider]  atorvastatin (LIPITOR) 40 MG tablet Take 1 tablet (40 mg total) by mouth daily at 6 PM. 03/09/17  Yes [name], MD  Cholecalciferol (VITAMIN D3) 2000 units TABS Take 4,000 Units by mouth daily.    Yes [provider]  fexofenadine (ALLEGRA ALLERGY) 180 MG tablet Take 180 mg by mouth daily.   Yes [provider]  fluticasone (FLONASE) 50 MCG/ACT nasal spray Place 2 sprays into both nostrils daily.   Yes [provider]  lipase/protease/amylase (CREON) 36000 UNITS CPEP capsule Take 1 capsule (36,000 Units total) by mouth 3 (three) times daily before meals. 02/16/17  Yes [name], MD  Metoclopramide HCl 5 MG TBDP Take 1 tablet (5 mg total) by mouth 4 (four) times daily -  before meals and at bedtime. 02/16/17  Yes [name], MD  metoprolol tartrate (LOPRESSOR) 25 MG tablet Take 1 tablet (25 mg total) by mouth 2 (two) times daily. 03/09/17  Yes [name], MD  mirabegron ER (MYRBETRIQ) 50 MG TB24 tablet Take 1 tablet (50 mg total) by mouth daily. 04/30/17  Yes [name], MD  nitroGLYCERIN (NITROSTAT)  0.4 MG SL tablet Place 1 tablet (0.4 mg total) under the tongue every 5 (five) minutes as needed for chest pain. 05/11/16  Yes [name], MD  Omega-3 Fatty Acids (FISH OIL) 1000 MG CAPS Take 1,000 mg by mouth daily.    Yes [provider]  omeprazole (PRILOSEC) 40 MG capsule Take 40 mg by mouth daily.   Yes [provider]  prazosin (MINIPRESS) 2 MG capsule Take 2 mg by mouth at bedtime.  02/17/16  Yes [provider]  Prenatal Multivit-Min-Fe-FA (PRENATAL VITAMINS PO) Take 1 tablet by mouth daily.   Yes [provider]  PRESCRIPTION MEDICATION Inhale into the lungs at bedtime. CPAP   Yes [provider]  Vilazodone HCl (VIIBRYD) 40 MG TABS Take 20 mg by mouth daily.    Yes [provider]  vitamin B-12 (CYANOCOBALAMIN) 500 MCG tablet Take 500 mcg by mouth daily.   Yes [provider]    Family History Family History  Problem Relation Age of Onset  . Breast cancer Mother        bilateral; ages 53 and 73; TAH/BSO ~50  . Depression Sister   . Heart disease Father   . Hypertension Father   . Heart attack Father   . Colon cancer Neg Hx   . Esophageal cancer Neg Hx   . Pancreatic cancer Neg Hx   . Rectal cancer Neg Hx   . Stomach cancer Neg Hx     Social History Social History   Tobacco Use  . Smoking status: Never Smoker  . Smokeless tobacco: Never Used  Substance Use Topics  . Alcohol use: Yes    Alcohol/week: 0.0 oz    Comment: Wine tonight with dinner .  [name] Drug use: Yes    Types: Marijuana    Comment: "tried marijuana a few times in college"     Allergies   Depakote [divalproex sodium]; Diazepam; Valium; and Tizanidine hcl   Review of Systems Review of Systems  Constitutional: Negative for chills, diaphoresis and fever.  Cardiovascular: Positive for chest pain. Negative for palpitations and leg swelling.  Gastrointestinal: Positive for nausea. Negative for abdominal pain, constipation, diarrhea and  vomiting.  All other systems reviewed and are negative.    Physical Exam Updated Vital Signs BP 104/68   Pulse 72  Temp 97.8 F (36.6 C) (Oral)   Resp 13   Ht 5\' 3"  (1.6 m)   Wt 53.5 kg (118 lb)   SpO2 96%   BMI 20.90 kg/m   Physical Exam  Constitutional: She is oriented to person, place, and time. She appears well-developed and well-nourished. No distress.  Non-toxic appearing, appears comfortable resting in bed.  HENT:  Head: Normocephalic and atraumatic.  OP clear and moist.  Neck: Neck supple. No JVD present.  Cardiovascular: Normal rate, regular rhythm and normal heart sounds.  No murmur heard. Pulmonary/Chest: Effort normal and breath sounds normal. No respiratory distress.  Abdominal: Soft. She exhibits no distension. There is no tenderness.  Musculoskeletal: She exhibits no edema.  Neurological: She is alert and oriented to person, place, and time.  Skin: Skin is warm and dry.  Nursing note and vitals reviewed.   ED Treatments / Results  Labs (all labs ordered are listed, but only abnormal results are displayed) Labs Reviewed  BASIC METABOLIC PANEL - Abnormal; Notable for the following components:      Result Value   Glucose, Bld 125 (*)    All other components within normal limits  CBC - Abnormal; Notable for the following components:   WBC 3.0 (*)    All other components within normal limits  I-STAT TROPONIN, ED  I-STAT TROPONIN, ED    EKG  EKG Interpretation  Date/Time:  Tuesday May 10 2017 10:55:19 EST Ventricular Rate:  69 PR Interval:    QRS Duration: 88 QT Interval:  508 QTC Calculation: 545 R Axis:   62 Text Interpretation:  Sinus rhythm Prolonged QT interval No significant change since last tracing Confirmed by Theotis Burrow [phone]) on 05/10/2017 11:03:58 AM Also confirmed by Theotis Burrow [phone]), editor Philomena Doheny [phone])  on 05/10/2017 12:23:37 PM       Radiology Dg Neck Soft Tissue  Result Date: 05/10/2017 CLINICAL DATA:   Throat pain EXAM: NECK SOFT TISSUES - 1+ VIEW COMPARISON:  None. FINDINGS: There is no evidence of retropharyngeal soft tissue swelling or epiglottic enlargement. The cervical airway is unremarkable and no radio-opaque foreign body identified. Degenerative changes of the cervical spine are noted. IMPRESSION: No acute abnormality noted. Electronically Signed   By: Inez Catalina M.D.   On: 05/10/2017 11:43   Dg Chest 2 View  Result Date: 05/10/2017 CLINICAL DATA:  Chest pain EXAM: CHEST  2 VIEW COMPARISON:  12/30/2016 FINDINGS: Lungs are hyperexpanded similar appearance probable posterior left diaphragmatic eventration or hernia. The lungs are clear without focal pneumonia, edema, pneumothorax or pleural effusion. The cardiopericardial silhouette is within normal limits for size. Patient is status post cardiac valve replacement. Telemetry leads overlie the chest. IMPRESSION: Hyperexpansion without acute findings. Electronically Signed   By: Misty Stanley M.D.   On: 05/10/2017 11:47    Procedures Procedures (including critical care time)  Medications Ordered in ED Medications  ketorolac (TORADOL) 30 MG/ML injection 15 mg (not administered)  gi cocktail (Maalox,Lidocaine,Donnatal) (30 mLs Oral Given 05/10/17 1201)  morphine 4 MG/ML injection 4 mg (4 mg Intravenous Given 05/10/17 1418)     Initial Impression / Assessment and Plan / ED Course  I have reviewed the triage vital signs and the nursing notes.  Pertinent labs & imaging results that were available during my care of the patient were reviewed by me and considered in my medical decision making (see chart for details).    [name] is a 57 y.o. female who presents to ED for throat  pain which will radiate to chest. Normal cardiopulmonary exam. Afebrile. VSS. Appears comfortable on exam. Hx of cardiac arrest and NSTEMI in the past, however this was in the setting of aspiration PNA. Had reassuring stress test following this. Hx of dysphagia with  recurrent esophagitis and esophageal infectious. Patient states pain feels similar to previous episodes of esophagitis. Troponin negative x 2. EKG unchanged from previous. CXR and neck film negative. Highly atypical for ACS or PE. She is tolerating PO and has eaten in ED today. While resting comfortably in the room, she has asked for dilaudid multiple times for her throat pain. Do not feel this is needed at this time. Did give GI cocktail and subsequently morphine. Evaluation does not show pathology that would require ongoing emergent intervention or inpatient treatment. Spoke with patient at length about return precautions. Will need to follow up with both GI and cards. All questions answered.  Patient discussed with Dr. Rex Kras who agrees with treatment plan.   Final Clinical Impressions(s) / ED Diagnoses   Final diagnoses:  Atypical chest pain  Throat pain in adult    ED Discharge Orders    None       Vernessa Likes, Ozella Almond, PA-C 05/10/17 1648    Little, Wenda Overland, MD 05/11/17 [phone]

## 2017-05-10 NOTE — ED Notes (Signed)
Patient transported to X-ray 

## 2017-05-10 NOTE — ED Notes (Signed)
Pt taking po fluids and tolerating well. 

## 2017-05-19 DIAGNOSIS — R69 Illness, unspecified: Secondary | ICD-10-CM | POA: Diagnosis not present

## 2017-05-23 DIAGNOSIS — R69 Illness, unspecified: Secondary | ICD-10-CM | POA: Diagnosis not present

## 2017-05-24 ENCOUNTER — Ambulatory Visit (INDEPENDENT_AMBULATORY_CARE_PROVIDER_SITE_OTHER): Payer: Medicare HMO | Admitting: Gastroenterology

## 2017-05-24 ENCOUNTER — Encounter: Payer: Self-pay | Admitting: Gastroenterology

## 2017-05-24 VITALS — BP 110/72 | HR 80 | Ht 63.0 in | Wt 119.0 lb

## 2017-05-24 DIAGNOSIS — R1310 Dysphagia, unspecified: Secondary | ICD-10-CM

## 2017-05-24 DIAGNOSIS — R69 Illness, unspecified: Secondary | ICD-10-CM | POA: Diagnosis not present

## 2017-05-24 NOTE — Patient Instructions (Addendum)
Chew your food well, eat slowly and take small bites.  You should always be wearing her dentures both uppers and lowers whenever you eat.  Please call your dentist today to be refit your bottom dentures.  Continue taking antiacid medicine omeprazole shortly before breakfast meal every day.

## 2017-05-24 NOTE — Progress Notes (Signed)
Review of pertinent gastrointestinal problems: 1. Routine risk for colon cancer: colonsocopy Dr. Ardis Hughs 2009 was normal. Repeat colonoscopy Dr. Ardis Hughs 10/2013 normal except single diminutive polyp that was removed, not retrieved, recommended recall in 10 years. 2. Mild gastritis,EGD Dr. Ardis Hughs 10/2013; biopsies show reactive gastropathy, no H pylori. Also manilia esophagititis, treated with diflucan empirically. 3. Dysphagia from recurrent yeast infections:11/15 EGD Dr. Ardis Hughs found [place], tortuous esophagus, again candida esophagitis, treated with diflucan. Office visit April 2016 she reported that she has always had trouble with used infections after antibiotics. Even when she was a little girl. She will get vaginal infections, abdominal wall infections. Lately she has had trouble with esophageal yeast infections.EGD 07/2016 Dr. Carlean Purl showed candida esophagitis again and also Schatzki's ring was dilated with scope passage (59mm up to 14-22mm). Treated fungal infection again. Only wears upper dentures. EGD 12/2016 Dr. Ardis Hughs; + candida infection, medium sized HH, focal peptic appearing stricture dilated with scope passage, moderate gastritis (felt etoh related); diflucan called in, omerpazole increased to 40mg , advised to wear uppers and lowers; 03/2017 she reported dysphagia is better. 4. ?chronic pancreatitis, ? Cirrhosis: not sure she has either of these. No imaging to date (03/2017) has showed abnormal pancreas, CT scan 11/2016 showed "Prominence of the lateral segment of the left lobe of the liver and caudate lobe, raising question of a degree of hepatic cirrhosis." She has normal Plts, normal INR, normal LFTs;  no portal HTN changes, [place] with elastography 03/2017 shows NO sign of cirrhosis. 5. Alcoholism; Detox at [facility] August 2018. No alcohol since then; Detox 04/2017 again  HPI: This is a very pleasant 57 year old woman whom I last saw about 2 months ago  She had an overt episode of esophageal  food impaction with a large chicken bolus about a month ago.  She eventually presented to emergency room.  Fortunately the obstruction resolved with observation and I believe antianxiety medicine and IV.  She has good fitting upper dentures but her lower dentures do not fit well and she never wears them; not even when she is eating  Mild dysphagia since then; it is difficult for her to chew without her bottom dentures  Chief complaint is dysphasia  ROS: complete GI ROS as described in HPI, all other review negative.  Constitutional:  No unintentional weight loss (weight is up 4 pounds in 2 months)   Past Medical History:  Diagnosis Date  . Accidental drug overdose 10/26/2015  . Acute respiratory failure with hypoxia ([place]) 10/23/2015  . Allergy    SEASONAL  . Anemia   . Anemia, iron deficiency 03/29/2014  . Aortic atherosclerosis ([place])   . Arthritis    "hips, shoulders, hands" (11/17/2016)  . ASD (atrial septal defect)    a. s/p ASD repair in 1973  . Aspiration pneumonia of right lower lobe due to gastric secretions ([place])   . Bronchitis 09/20/2013  . CAD (coronary artery disease)    a. 12/2015: NSTEMI occurring after cardiac arrest secondary to aspiration PNA. Echo w/ EF of 55-60%, no WMA. Outpt ischemic eval needed.  . Cardiac arrest ([place]) 12/22/2015  . Cardiomyopathy   . Chronic bronchitis ([place])   . Chronic lower back pain   . Deaf    "not totally deaf; read lips and can hear some" (11/17/2016)  . Depression   . Diverticulosis   . Dysphagia    due to esophageal infections.  . Fibromyalgia   . Gastritis   . GERD (gastroesophageal reflux disease)   . Hearing  impairment   . Hepatic steatosis   . High cholesterol   . History of hiatal hernia   . History of kidney stones   . Hypertension    [name], take htn medication to regulate heart beat.  . Increased anion gap metabolic acidosis 8/84/1660  . Lactic acidosis 12/17/2015  . Major depressive disorder, recurrent episode,  moderate ([place]) 09/18/2014  . Meningitis spinal 1967  . Migraine    "all through my life" (11/17/2016)  . Nausea & vomiting 05/20/2014  . Neurocardiogenic syncope 07/19/2016  . Osteoporosis   . Pancytopenia ([place])   . PMB (postmenopausal bleeding) 02/26/2013  . Pneumonia    "several times" (11/17/2016)  . Pressure ulcer 12/18/2015  . 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 Pulmonary embolism ([place]) 2003   occured post c-section of her daughter  . Shock ([place]) 10/19/2015  . Sleep apnea   . Starvation ketoacidosis 12/17/2015  . Vitamin D deficiency     Past Surgical History:  Procedure Laterality Date  . APPENDECTOMY    . ATRIAL SEPTAL DEFECT(ASD) CLOSURE  1973  . [place]  . CESAREAN SECTION  2003  . COCHLEAR IMPLANT Right   . ESOPHAGOGASTRODUODENOSCOPY (EGD) WITH PROPOFOL N/A 07/20/2016   Procedure: ESOPHAGOGASTRODUODENOSCOPY (EGD) WITH PROPOFOL;  Surgeon: Gatha Mayer, MD;  Location: [place];  Service: Endoscopy;  Laterality: N/A;  . EYE MUSCLE SURGERY Bilateral   . INCISION AND DRAINAGE OF WOUND  1993   S/P c-section  . LAPAROSCOPIC CHOLECYSTECTOMY  2012  . MITRAL VALVE REPAIR  05/2005  . TONSILLECTOMY AND ADENOIDECTOMY    . TUBAL LIGATION  1993  . TYMPANOSTOMY TUBE PLACEMENT Bilateral "lots"    Current Outpatient Medications  Medication Sig Dispense Refill  . acetaminophen (TYLENOL) 325 MG tablet Take 2 tablets (650 mg total) by mouth every 6 (six) hours as needed for mild pain (or Fever >/= 101).    . Ascorbic Acid (VITAMIN C) 1000 MG tablet Take 1,000 mg by mouth daily.    [name] aspirin 81 MG chewable tablet Chew 81 mg by mouth daily.    [name] atorvastatin (LIPITOR) 40 MG tablet Take 1 tablet (40 mg total) by mouth daily at 6 PM. 90 tablet 1  . Cholecalciferol (VITAMIN D3) 2000 units TABS Take 4,000 Units by mouth daily.     . fexofenadine (ALLEGRA  ALLERGY) 180 MG tablet Take 180 mg by mouth daily.    . fluticasone (FLONASE) 50 MCG/ACT nasal spray Place 2 sprays into both nostrils daily.    . lipase/protease/amylase (CREON) 36000 UNITS CPEP capsule Take 1 capsule (36,000 Units total) by mouth 3 (three) times daily before meals. 90 capsule 2  . Metoclopramide HCl 5 MG TBDP Take 1 tablet (5 mg total) by mouth 4 (four) times daily -  before meals and at bedtime. 120 tablet 1  . metoprolol tartrate (LOPRESSOR) 25 MG tablet Take 1 tablet (25 mg total) by mouth 2 (two) times daily. 180 tablet 3  . mirabegron ER (MYRBETRIQ) 50 MG TB24 tablet Take 1 tablet (50 mg total) by mouth daily. 90 tablet 1  . nitroGLYCERIN (NITROSTAT) 0.4 MG SL tablet Place 1 tablet (0.4 mg total) under the tongue every 5 (five) minutes as needed for chest pain. 30 tablet 0  . Omega-3 Fatty Acids (FISH OIL) 1000 MG CAPS Take 1,000 mg by mouth daily.     [name]  omeprazole (PRILOSEC) 40 MG capsule Take 40 mg by mouth daily.    . prazosin (MINIPRESS) 2 MG capsule Take 2 mg by mouth at bedtime.     . Prenatal Multivit-Min-Fe-FA (PRENATAL VITAMINS PO) Take 1 tablet by mouth daily.    [name] PRESCRIPTION MEDICATION Inhale into the lungs at bedtime. CPAP    . Vilazodone HCl (VIIBRYD) 40 MG TABS Take 20 mg by mouth daily.     . vitamin B-12 (CYANOCOBALAMIN) 500 MCG tablet Take 500 mcg by mouth daily.     Current Facility-Administered Medications  Medication Dose Route Frequency Provider Last Rate Last Dose  . 0.9 %  sodium chloride infusion  500 mL Intravenous Continuous Milus Banister, MD        Allergies as of 05/24/2017 - Review Complete 05/24/2017  Allergen Reaction Noted  . Depakote [divalproex sodium] Anaphylaxis 07/01/2012  . Diazepam Other (See Comments) 09/03/2015  . Valium Anaphylaxis 09/23/2010  . Tizanidine hcl Other (See Comments) 12/06/2015    Family History  Problem Relation Age of Onset  . Breast cancer Mother        bilateral; ages 63 and 39; TAH/BSO ~50  .  Depression Sister   . Heart disease Father   . Hypertension Father   . Heart attack Father   . Colon cancer Neg Hx   . Esophageal cancer Neg Hx   . Pancreatic cancer Neg Hx   . Rectal cancer Neg Hx   . Stomach cancer Neg Hx     Social History   Socioeconomic History  . Marital status: Divorced    Spouse name: Not on file  . Number of children: Not on file  . Years of education: Not on file  . Highest education level: Not on file  Social Needs  . Financial resource strain: Not on file  . Food insecurity - worry: Not on file  . Food insecurity - inability: Not on file  . Transportation needs - medical: Not on file  . Transportation needs - non-medical: Not on file  Occupational History  . Not on file  Tobacco Use  . Smoking status: Never Smoker  . Smokeless tobacco: Never Used  Substance and Sexual Activity  . Alcohol use: Yes    Alcohol/week: 0.0 oz    Comment: Wine tonight with dinner .  [name] Drug use: Yes    Types: Marijuana    Comment: "tried marijuana a few times in college"  . Sexual activity: Not Currently    Birth control/protection: Surgical    Comment: 1st intercourse- 19, partners- 6  Other Topics Concern  . Not on file  Social History Narrative   Lives with 37 year old daughter.         Physical Exam: BP 110/72   Pulse 80   Ht 5\' 3"  (1.6 m)   Wt 119 lb (54 kg)   BMI 21.08 kg/m  Constitutional: generally well-appearing Psychiatric: alert and oriented x3 Abdomen: soft, nontender, nondistended, no obvious ascites, no peritoneal signs, normal bowel sounds No peripheral edema noted in lower extremities  Assessment and plan: 57 y.o. female with chronic intermittent dysphasia  Multifactorial.  She has had esophageal yeast infections in the past.  I think a lot of what is going on now is mechanical.  She has good sitting uppers but she never wears her lowers because it did not fit.  It is very hard to chew your food well enough without good dentition and  I urged her strongly to get in touch  with her dentist today to see about refitting her bottom dentures.  She knows to chew her food well and eat slowly and take small bites and she should continue on proton pump inhibitor once daily shortly before her breakfast meal.  Please see the "Patient Instructions" section for addition details about the plan.  Owens Loffler, MD [facility] 05/24/2017, 8:53 AM

## 2017-05-31 DIAGNOSIS — R69 Illness, unspecified: Secondary | ICD-10-CM | POA: Diagnosis not present

## 2017-06-13 DIAGNOSIS — I10 Essential (primary) hypertension: Secondary | ICD-10-CM | POA: Diagnosis not present

## 2017-06-13 DIAGNOSIS — R69 Illness, unspecified: Secondary | ICD-10-CM | POA: Diagnosis not present

## 2017-06-13 DIAGNOSIS — Z9049 Acquired absence of other specified parts of digestive tract: Secondary | ICD-10-CM | POA: Diagnosis not present

## 2017-06-13 DIAGNOSIS — Z7982 Long term (current) use of aspirin: Secondary | ICD-10-CM | POA: Diagnosis not present

## 2017-06-13 DIAGNOSIS — F332 Major depressive disorder, recurrent severe without psychotic features: Secondary | ICD-10-CM | POA: Diagnosis not present

## 2017-06-13 DIAGNOSIS — M79675 Pain in left toe(s): Secondary | ICD-10-CM | POA: Diagnosis not present

## 2017-06-13 DIAGNOSIS — S99922A Unspecified injury of left foot, initial encounter: Secondary | ICD-10-CM | POA: Diagnosis not present

## 2017-06-13 DIAGNOSIS — Y999 Unspecified external cause status: Secondary | ICD-10-CM | POA: Diagnosis not present

## 2017-06-13 DIAGNOSIS — S93332A Other subluxation of left foot, initial encounter: Secondary | ICD-10-CM | POA: Diagnosis not present

## 2017-06-13 DIAGNOSIS — W228XXA Striking against or struck by other objects, initial encounter: Secondary | ICD-10-CM | POA: Diagnosis not present

## 2017-06-13 DIAGNOSIS — I252 Old myocardial infarction: Secondary | ICD-10-CM | POA: Diagnosis not present

## 2017-06-15 IMAGING — CR DG SACRUM/COCCYX 2+V
3 series · 3 of 3 positions shown · non-contrast
Comparison: Abdominal CT 03/29/2015

CLINICAL DATA: Fall 4 days ago with coccyx tenderness. Initial
encounter.

EXAM:
SACRUM AND COCCYX - 2+ VIEW

[t sacrum ap]
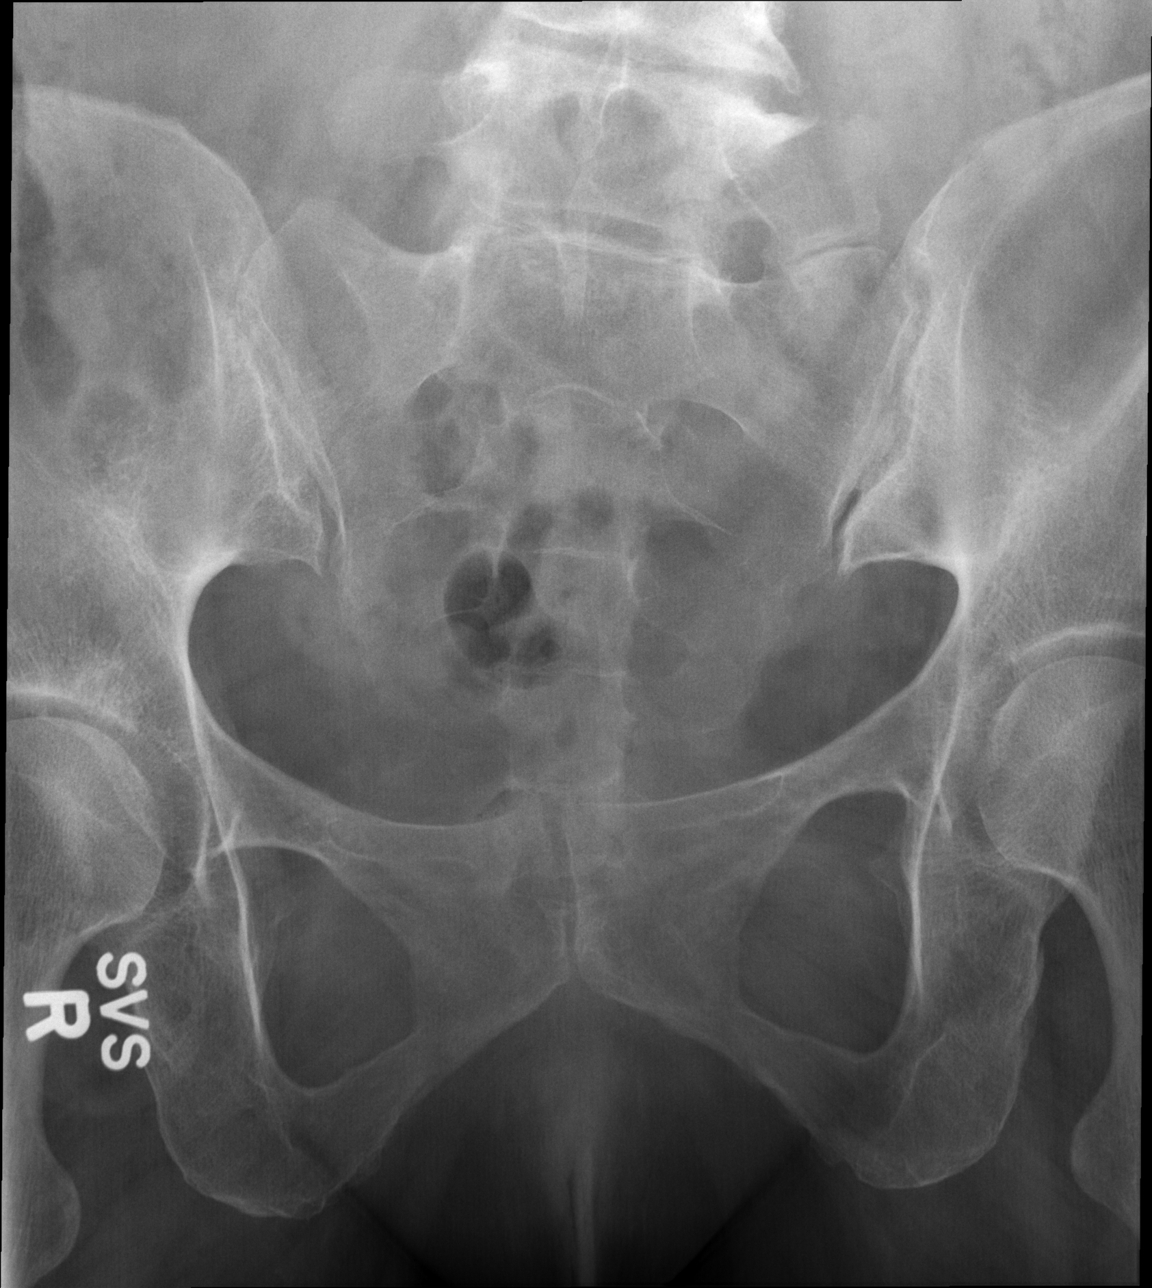

[t coccyx ap]
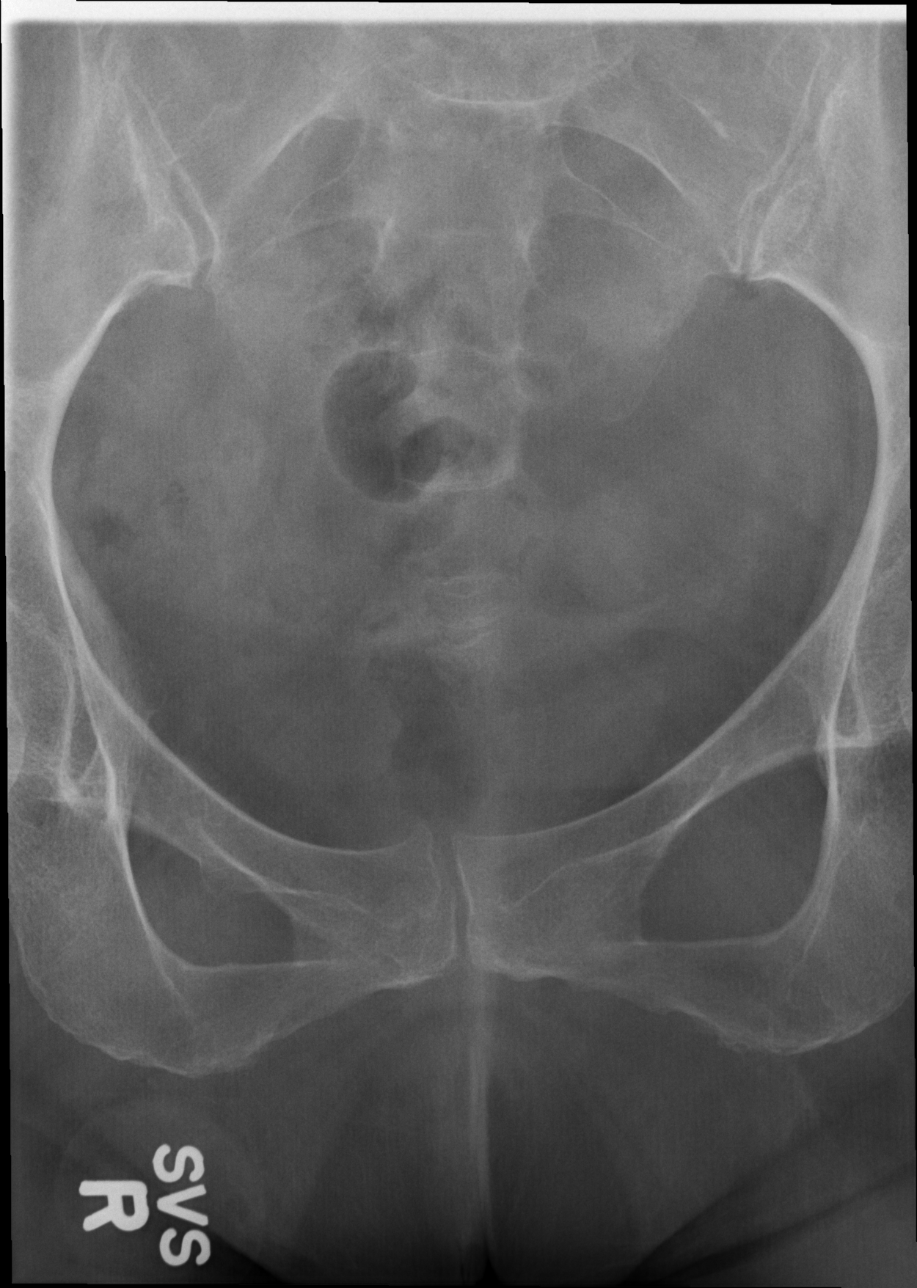

[t sacrum coccyx lat]
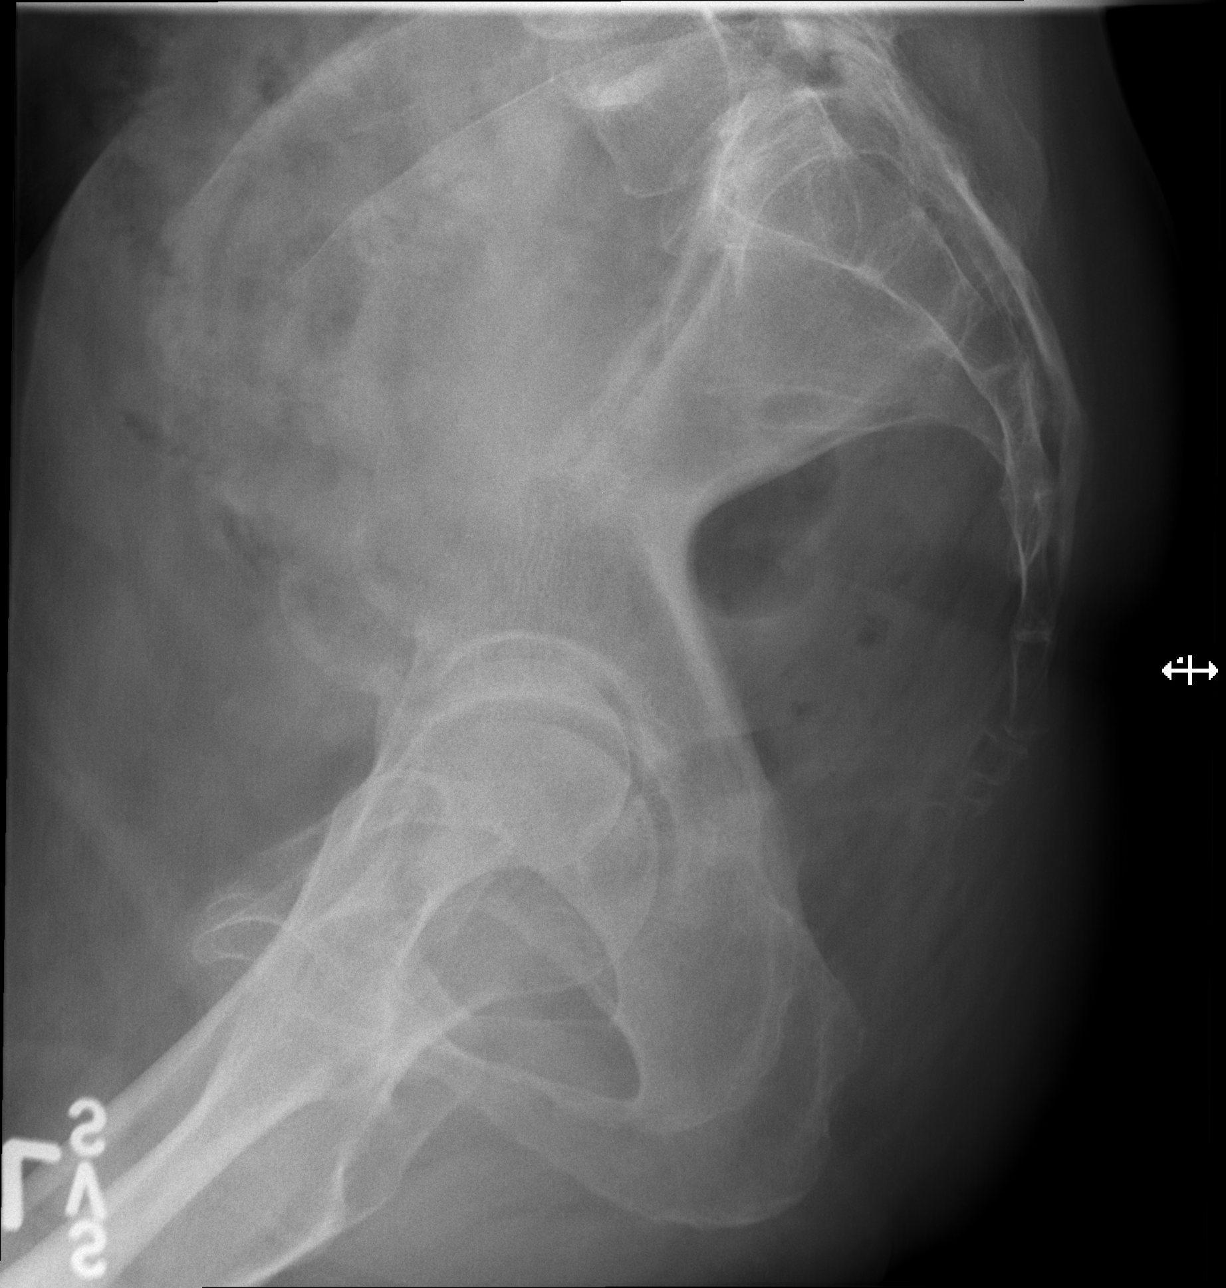

[3 of 3 positions shown; findings below may reference images not displayed]

FINDINGS: No evidence of acute fracture. No sacroiliac diastasis. Sacral
coccygeal alignment is stable from prior CT sagittal reformats.
Transitional lumbosacral vertebra with articulation between the left
transverse process and sacrum.
IMPRESSION: No acute finding.

## 2017-06-20 DIAGNOSIS — Z0101 Encounter for examination of eyes and vision with abnormal findings: Secondary | ICD-10-CM | POA: Diagnosis not present

## 2017-06-23 DIAGNOSIS — R69 Illness, unspecified: Secondary | ICD-10-CM | POA: Diagnosis not present

## 2017-06-28 DIAGNOSIS — R69 Illness, unspecified: Secondary | ICD-10-CM | POA: Diagnosis not present

## 2017-06-30 DIAGNOSIS — M79672 Pain in left foot: Secondary | ICD-10-CM | POA: Diagnosis not present

## 2017-07-05 ENCOUNTER — Emergency Department (HOSPITAL_COMMUNITY): Payer: Medicare HMO

## 2017-07-05 ENCOUNTER — Encounter (HOSPITAL_COMMUNITY): Payer: Self-pay

## 2017-07-05 ENCOUNTER — Emergency Department (HOSPITAL_COMMUNITY)
Admission: EM | Admit: 2017-07-05 | Discharge: 2017-07-06 | Disposition: A | Discharge status: Home / Self Care | Payer: Medicare HMO | Attending: Emergency Medicine | Admitting: Emergency Medicine

## 2017-07-05 ENCOUNTER — Other Ambulatory Visit: Payer: Self-pay

## 2017-07-05 DIAGNOSIS — I252 Old myocardial infarction: Secondary | ICD-10-CM | POA: Insufficient documentation

## 2017-07-05 DIAGNOSIS — I2510 Atherosclerotic heart disease of native coronary artery without angina pectoris: Secondary | ICD-10-CM | POA: Insufficient documentation

## 2017-07-05 DIAGNOSIS — R4020 Unspecified coma: Secondary | ICD-10-CM | POA: Diagnosis not present

## 2017-07-05 DIAGNOSIS — Z79899 Other long term (current) drug therapy: Secondary | ICD-10-CM | POA: Diagnosis not present

## 2017-07-05 DIAGNOSIS — R4182 Altered mental status, unspecified: Secondary | ICD-10-CM | POA: Insufficient documentation

## 2017-07-05 DIAGNOSIS — Z7982 Long term (current) use of aspirin: Secondary | ICD-10-CM | POA: Insufficient documentation

## 2017-07-05 DIAGNOSIS — F10129 Alcohol abuse with intoxication, unspecified: Secondary | ICD-10-CM | POA: Insufficient documentation

## 2017-07-05 DIAGNOSIS — I10 Essential (primary) hypertension: Secondary | ICD-10-CM | POA: Diagnosis not present

## 2017-07-05 DIAGNOSIS — R1111 Vomiting without nausea: Secondary | ICD-10-CM | POA: Diagnosis not present

## 2017-07-05 DIAGNOSIS — Y908 Blood alcohol level of 240 mg/100 ml or more: Secondary | ICD-10-CM | POA: Insufficient documentation

## 2017-07-05 DIAGNOSIS — F10929 Alcohol use, unspecified with intoxication, unspecified: Secondary | ICD-10-CM | POA: Diagnosis not present

## 2017-07-05 DIAGNOSIS — R69 Illness, unspecified: Secondary | ICD-10-CM | POA: Diagnosis not present

## 2017-07-05 DIAGNOSIS — F1010 Alcohol abuse, uncomplicated: Secondary | ICD-10-CM

## 2017-07-05 LAB — COMPREHENSIVE METABOLIC PANEL
ALK PHOS: 87 U/L (ref 38–126)
ALT: 24 U/L (ref 14–54)
AST: 31 U/L (ref 15–41)
Albumin: 3.6 g/dL (ref 3.5–5.0)
Anion gap: 17 — ABNORMAL HIGH (ref 5–15)
BILIRUBIN TOTAL: 0.7 mg/dL (ref 0.3–1.2)
BUN: 17 mg/dL (ref 6–20)
CALCIUM: 8.5 mg/dL — AB (ref 8.9–10.3)
CO2: 21 mmol/L — AB (ref 22–32)
CREATININE: 0.67 mg/dL (ref 0.44–1.00)
Chloride: 106 mmol/L (ref 101–111)
GFR calc non Af Amer: >60 mL/min (ref >60)
GLUCOSE: 83 mg/dL (ref 65–99)
Potassium: 3.7 mmol/L (ref 3.5–5.1)
SODIUM: 144 mmol/L (ref 135–145)
TOTAL PROTEIN: 6.7 g/dL (ref 6.5–8.1)

## 2017-07-05 LAB — ETHANOL: ALCOHOL ETHYL (B): 389 mg/dL — AB (ref <10)

## 2017-07-05 LAB — CBC
HCT: 41.6 % (ref 36.0–46.0)
Hemoglobin: 13.7 g/dL (ref 12.0–15.0)
MCH: 31.6 pg (ref 26.0–34.0)
MCHC: 32.9 g/dL (ref 30.0–36.0)
MCV: 95.9 fL (ref 78.0–100.0)
PLATELETS: 278 10*3/uL (ref 150–400)
RBC: 4.34 MIL/uL (ref 3.87–5.11)
RDW: 15.2 % (ref 11.5–15.5)
WBC: 4.3 10*3/uL (ref 4.0–10.5)

## 2017-07-05 LAB — I-STAT BETA HCG BLOOD, ED (MC, WL, AP ONLY): I-stat hCG, quantitative: <5 m[IU]/mL (ref <5)

## 2017-07-05 LAB — CBG MONITORING, ED: Glucose-Capillary: 72 mg/dL (ref 65–99)

## 2017-07-05 NOTE — ED Provider Notes (Signed)
[facility] EMERGENCY DEPARTMENT Provider Note   CSN: [id number] Arrival date & time: 07/05/17  1732     History   Chief Complaint Chief Complaint  Patient presents with  . Altered Mental Status    HPI [name] is a 57 y.o. female.  57 yo F with a chief complaint of altered mental status.  The daughter found her at home with a diminished mental status.  Felt that she was slow to respond and was less responsive than normal.  She is a known alcoholic.  She did state that she had been drinking today.  She refuses to provide any further history.  She demanded to have a sign language interpreter though she did not know much if any sign language when one was provided.   The history is provided by the patient.  Altered Mental Status   This is a new problem. The current episode started less than 1 hour ago. The problem has not changed since onset.Associated symptoms include confusion.  Illness  This is a new problem. The current episode started 2 days ago. The problem occurs constantly. The problem has not changed since onset.Pertinent negatives include no chest pain, no headaches and no shortness of breath. Nothing aggravates the symptoms. Nothing relieves the symptoms. She has tried nothing for the symptoms. The treatment provided no relief.    Past Medical History:  Diagnosis Date  . Accidental drug overdose 10/26/2015  . Acute respiratory failure with hypoxia ([place]) 10/23/2015  . Allergy    SEASONAL  . Anemia   . Anemia, iron deficiency 03/29/2014  . Aortic atherosclerosis ([place])   . Arthritis    "hips, shoulders, hands" (11/17/2016)  . ASD (atrial septal defect)    a. s/p ASD repair in 1973  . Aspiration pneumonia of right lower lobe due to gastric secretions ([place])   . Bronchitis 09/20/2013  . CAD (coronary artery disease)    a. 12/2015: NSTEMI occurring after cardiac arrest secondary to aspiration PNA. Echo w/ EF of 55-60%, no WMA. Outpt ischemic eval  needed.  . Cardiac arrest ([place]) 12/22/2015  . Cardiomyopathy   . Chronic bronchitis ([place])   . Chronic lower back pain   . Deaf    "not totally deaf; read lips and can hear some" (11/17/2016)  . Depression   . Diverticulosis   . Dysphagia    due to esophageal infections.  . Fibromyalgia   . Gastritis   . GERD (gastroesophageal reflux disease)   . Hearing impairment   . Hepatic steatosis   . High cholesterol   . History of hiatal hernia   . History of kidney stones   . Hypertension    [name], take htn medication to regulate heart beat.  . Increased anion gap metabolic acidosis 08/16/8117  . Lactic acidosis 12/17/2015  . Major depressive disorder, recurrent episode, moderate ([place]) 09/18/2014  . Meningitis spinal 1967  . Migraine    "all through my life" (11/17/2016)  . Nausea & vomiting 05/20/2014  . Neurocardiogenic syncope 07/19/2016  . Osteoporosis   . Pancytopenia ([place])   . PMB (postmenopausal bleeding) 02/26/2013  . Pneumonia    "several times" (11/17/2016)  . Pressure ulcer 12/18/2015  . 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 Pulmonary embolism ([place]) 2003   occured post c-section of her daughter  . Shock ([place]) 10/19/2015  . Sleep apnea   .  Starvation ketoacidosis 12/17/2015  . Vitamin D deficiency     Patient Active Problem List   Diagnosis Date Noted  . Chronic pancreatitis due to acute alcohol intoxication ([place]) 11/17/2016  . Candida infection, esophageal ([place])   . Esophageal ring, acquired   . Abdominal pain, epigastric   . Bradycardia   . Hypokalemia   . NSTEMI (non-ST elevated myocardial infarction) ([place]) 12/19/2015  . Typical atrial flutter ([place])   . Protein-calorie malnutrition, severe 12/18/2015  . Alcohol abuse 12/17/2015  . Essential hypertension 12/06/2015  . Non-traumatic compression fracture of vertebral column with routine healing 07/17/2014  .  Osteoporosis 07/17/2014  . Odynophagia 07/09/2014  . Esophageal dysphagia 07/08/2014  . Chest pain 05/20/2014  . 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 Hypercholesterolemia without hypertriglyceridemia 03/29/2014  . Fatigue 03/29/2014  . Classical migraine with intractable migraine 03/29/2014  . Disorder of mitral valve 03/29/2014  . Atypical migraine 03/29/2014  . Atrial septal defect of fossa ovalis 03/29/2014  . Arthralgia of multiple joints 03/29/2014  . Fibrositis 03/29/2014  . Abdominal pain 12/20/2013  . Absolute anemia 09/20/2013  . Family history of breast cancer 07/13/2013  . Palpitation 07/02/2013  . History of mitral valve repair 07/02/2013  . Status post patch closure of ASD 07/02/2013  . Hyperlipidemia 07/02/2013  . OAB (overactive bladder) 06/13/2013  . Ovarian cyst, left 03/15/2013  . Vaginal lesion 03/15/2013  . Ovarian mass 03/15/2013  . Maternal DVT (deep vein thrombosis), history of 02/26/2013  . Alcohol dependence ([place]) 11/07/2012  . Unspecified vitamin D deficiency 08/17/2012  . L1 vertebral fracture ([place]) 08/01/2012  . Spinal compression fracture ([place]) 07/12/2012  . Menopausal state 07/12/2012  . Psoriasis 07/12/2012  . Severe episode of recurrent major depressive disorder ([place]) 12/07/2011  . Cardiomyopathy 03/16/2011  . Hearing impairment   . Arthritis     Past Surgical History:  Procedure Laterality Date  . APPENDECTOMY    . ATRIAL SEPTAL DEFECT(ASD) CLOSURE  1973  . [place]  . CESAREAN SECTION  2003  . COCHLEAR IMPLANT Right   . ESOPHAGOGASTRODUODENOSCOPY (EGD) WITH PROPOFOL N/A 07/20/2016   Procedure: ESOPHAGOGASTRODUODENOSCOPY (EGD) WITH PROPOFOL;  Surgeon: Gatha Mayer, MD;  Location: [facility];  Service: Endoscopy;  Laterality: N/A;  . EYE MUSCLE SURGERY Bilateral   . INCISION AND DRAINAGE OF WOUND  1993   S/P c-section  . LAPAROSCOPIC CHOLECYSTECTOMY  2012  . MITRAL VALVE  REPAIR  05/2005  . TONSILLECTOMY AND ADENOIDECTOMY    . TUBAL LIGATION  1993  . TYMPANOSTOMY TUBE PLACEMENT Bilateral "lots"    OB History    Gravida Para Term Preterm AB Living   2 2 2  0 0     SAB TAB Ectopic Multiple Live Births   0 0 0           Home Medications    Prior to Admission medications   Medication Sig Start Date End Date Taking? Authorizing Provider  acetaminophen (TYLENOL) 325 MG tablet Take 2 tablets (650 mg total) by mouth every 6 (six) hours as needed for mild pain (or Fever >/= 101). 11/20/16   [name], MD  Ascorbic Acid (VITAMIN C) 1000 MG tablet Take 1,000 mg by mouth daily.    [provider]  aspirin 81 MG chewable tablet Chew 81 mg by mouth daily.    [provider]  atorvastatin (LIPITOR) 40 MG tablet Take 1 tablet (40 mg total) by mouth daily at 6 PM. 03/09/17  [name], MD  Cholecalciferol (VITAMIN D3) 2000 units TABS Take 4,000 Units by mouth daily.     [provider]  fexofenadine (ALLEGRA ALLERGY) 180 MG tablet Take 180 mg by mouth daily.    [provider]  fluticasone (FLONASE) 50 MCG/ACT nasal spray Place 2 sprays into both nostrils daily.    [provider]  lipase/protease/amylase (CREON) 36000 UNITS CPEP capsule Take 1 capsule (36,000 Units total) by mouth 3 (three) times daily before meals. 02/16/17   [name], MD  Metoclopramide HCl 5 MG TBDP Take 1 tablet (5 mg total) by mouth 4 (four) times daily -  before meals and at bedtime. 02/16/17   [name], MD  metoprolol tartrate (LOPRESSOR) 25 MG tablet Take 1 tablet (25 mg total) by mouth 2 (two) times daily. 03/09/17   [name], MD  mirabegron ER (MYRBETRIQ) 50 MG TB24 tablet Take 1 tablet (50 mg total) by mouth daily. 04/30/17   [name], MD  nitroGLYCERIN (NITROSTAT) 0.4 MG SL tablet Place 1 tablet (0.4 mg total) under the tongue every 5 (five) minutes as needed for chest pain. 05/11/16   [name], MD  Omega-3 Fatty Acids (FISH  OIL) 1000 MG CAPS Take 1,000 mg by mouth daily.     [provider]  omeprazole (PRILOSEC) 40 MG capsule Take 40 mg by mouth daily.    [provider]  prazosin (MINIPRESS) 2 MG capsule Take 2 mg by mouth at bedtime.  02/17/16   [provider]  Prenatal Multivit-Min-Fe-FA (PRENATAL VITAMINS PO) Take 1 tablet by mouth daily.    [provider]  PRESCRIPTION MEDICATION Inhale into the lungs at bedtime. CPAP    [provider]  Vilazodone HCl (VIIBRYD) 40 MG TABS Take 20 mg by mouth daily.     [provider]  vitamin B-12 (CYANOCOBALAMIN) 500 MCG tablet Take 500 mcg by mouth daily.    [provider]    Family History Family History  Problem Relation Age of Onset  . Breast cancer Mother        bilateral; ages 55 and 52; TAH/BSO ~50  . Depression Sister   . Heart disease Father   . Hypertension Father   . Heart attack Father   . Colon cancer Neg Hx   . Esophageal cancer Neg Hx   . Pancreatic cancer Neg Hx   . Rectal cancer Neg Hx   . Stomach cancer Neg Hx     Social History Social History   Tobacco Use  . Smoking status: Never Smoker  . Smokeless tobacco: Never Used  Substance Use Topics  . Alcohol use: Yes    Alcohol/week: 0.0 oz    Comment: Wine tonight with dinner .  [name] Drug use: Yes    Types: Marijuana    Comment: "tried marijuana a few times in college"     Allergies   Depakote [divalproex sodium]; Diazepam; Valium; and Tizanidine hcl   Review of Systems Review of Systems  Unable to perform ROS: Mental status change  Constitutional: Negative for chills and fever.  HENT: Negative for congestion and rhinorrhea.   Eyes: Negative for redness and visual disturbance.  Respiratory: Negative for shortness of breath and wheezing.   Cardiovascular: Negative for chest pain and palpitations.  Gastrointestinal: Negative for nausea and vomiting.  Genitourinary: Negative for dysuria and urgency.  Musculoskeletal:  Negative for arthralgias and myalgias.  Skin: Negative for pallor and wound.  Neurological: Negative  for dizziness and headaches.  Psychiatric/Behavioral: Positive for confusion.     Physical Exam Updated Vital Signs BP 110/68   Pulse 94   Resp 16   SpO2 97%   Physical Exam  Constitutional: She is oriented to person, place, and time. She appears well-developed and well-nourished. No distress.  HENT:  Head: Normocephalic and atraumatic.  Eyes: EOM are normal. Pupils are equal, round, and reactive to light.  Anisocoria, R pupil 16mm, L 52mm  Neck: Normal range of motion. Neck supple.  Cardiovascular: Normal rate and regular rhythm. Exam reveals no gallop and no friction rub.  No murmur heard. Pulmonary/Chest: Effort normal. She has no wheezes. She has no rales.  Abdominal: Soft. She exhibits no distension. There is no tenderness.  Musculoskeletal: She exhibits no edema or tenderness.  Neurological: She is alert and oriented to person, place, and time.  Skin: Skin is warm and dry. She is not diaphoretic.  Psychiatric: She has a normal mood and affect. Her behavior is normal.  Nursing note and vitals reviewed.    ED Treatments / Results  Labs (all labs ordered are listed, but only abnormal results are displayed) Labs Reviewed  COMPREHENSIVE METABOLIC PANEL - Abnormal; Notable for the following components:      Result Value   CO2 21 (*)    Calcium 8.5 (*)    Anion gap 17 (*)    All other components within normal limits  ETHANOL - Abnormal; Notable for the following components:   Alcohol, Ethyl (B) 389 (*)    All other components within normal limits  CBC  CBG MONITORING, ED  I-STAT BETA HCG BLOOD, ED ([facility], [facility], [facility] ONLY)    EKG  EKG Interpretation  Date/Time:  Tuesday July 05 2017 17:35:26 EST Ventricular Rate:  91 PR Interval:    QRS Duration: 85 QT Interval:  408 QTC Calculation: 505 R Axis:   -35 Text Interpretation:  Sinus rhythm Borderline prolonged PR  interval Left axis deviation Low voltage, extremity and precordial leads Borderline prolonged QT interval No significant change since last tracing Confirmed by Deno Etienne [phone]) on 07/05/2017 5:55:53 PM       Radiology Ct Head Wo Contrast  Result Date: 07/05/2017 CLINICAL DATA:  Altered level of consciousness. EXAM: CT HEAD WITHOUT CONTRAST TECHNIQUE: Contiguous axial images were obtained from the base of the skull through the vertex without intravenous contrast. COMPARISON:  None. FINDINGS: Brain: No evidence of acute infarction, hemorrhage, hydrocephalus, extra-axial collection or mass lesion/mass effect. Patchy low-density in the cerebral white matter, likely chronic small vessel ischemia given patient's medical history. Vascular: Atherosclerotic calcification.  No hyperdense vessel. Skull: No acute finding. Sinuses/Orbits: Negative. Other: There is a cochlear implant on the right. The associated mastoidectomy bowl is clear. Bilateral TMJ arthropathy. IMPRESSION: 1. No acute finding. 2. Moderate white matter disease, usually chronic small vessel ischemia. 3. Cochlear implant on the right. Electronically Signed   By: Monte Fantasia M.D.   On: 07/05/2017 18:27    Procedures Procedures (including critical care time)  Medications Ordered in ED Medications - No data to display   Initial Impression / Assessment and Plan / ED Course  I have reviewed the triage vital signs and the nursing notes.  Pertinent labs & imaging results that were available during my care of the patient were reviewed by me and considered in my medical decision making (see chart for details).     57 yo F with a chief complaint of altered mental status.  This  is likely secondary to alcohol abuse.  The patient is not very compliant with my exam.  EMS was called because she was acting differently than her normal even when she is drunk.  Anisocoria was noted, I have not seen this previously documented.  Will obtain a CT of  the head.  CT negative.  Etoh in the 400's.  Improved clinically.  D/c home.   3:06 PM:  I have discussed the diagnosis/risks/treatment options with the patient and family and believe the pt to be eligible for discharge home to follow-up with PCP. We also discussed returning to the ED immediately if new or worsening sx occur. We discussed the sx which are most concerning (e.g., sudden worsening pain, fever, inability to tolerate by mouth) that necessitate immediate return. Medications administered to the patient during their visit and any new prescriptions provided to the patient are listed below.  Medications given during this visit Medications - No data to display   The patient appears reasonably screen and/or stabilized for discharge and I doubt any other medical condition or other [facility] requiring further screening, evaluation, or treatment in the ED at this time prior to discharge.    Final Clinical Impressions(s) / ED Diagnoses   Final diagnoses:  Alcohol abuse    ED Discharge Orders    None       Deno Etienne, DO 07/06/17 1506

## 2017-07-05 NOTE — ED Notes (Signed)
This tech attempted to collect blood work from pt x2. Pt stated that it is "hard to get blood" from her.

## 2017-07-05 NOTE — ED Triage Notes (Signed)
Pt from home by [facility]. Pt daughter called EMS due to patient not acting correctly pt has history of ETOH use but per daughter pt doesn't act like this when she drinks. It is un known as to when the pt was last seen normal.

## 2017-07-05 NOTE — ED Notes (Signed)
Pt to CT at this time.

## 2017-07-12 ENCOUNTER — Telehealth: Payer: Self-pay

## 2017-07-12 DIAGNOSIS — R69 Illness, unspecified: Secondary | ICD-10-CM | POA: Diagnosis not present

## 2017-07-12 NOTE — Telephone Encounter (Signed)
    [facility] Pre-operative Risk Assessment    Request for surgical clearance:  1. What type of surgery is being performed? Left foot 2nd toe PIP fusion   2. When is this surgery scheduled? 07/25/17   3. What type of clearance is required (medical clearance vs. Pharmacy clearance to hold med vs. Both)? Both  4. Are there any medications that need to be held prior to surgery and how long? Aspirin  5. Practice name and name of physician performing surgery? [facility] Dr. Dorna Leitz  6. What is your office phone and fax number? Sandi Raveling Phone [phone] Fax [phone]   7. Anesthesia type (None, local, MAC, general) ? Not known   [name] 07/12/2017, 2:51 PM  ___________________________________

## 2017-07-14 NOTE — Telephone Encounter (Signed)
Chart reviewed, patient was seen in Oct 2018, since then she has had multiple admissions include an ED visit for chest pain in Jan 2019. I would recommend a cardiology visit next week prior to clearance. Please inform the patient and schedule followup

## 2017-07-14 NOTE — Telephone Encounter (Deleted)
*  correction appt scheduled with Ermalinda Barrios, PA for 07/19/17 @ 11:30am

## 2017-07-14 NOTE — Telephone Encounter (Addendum)
Per Almyra Deforest, PA pt needs to be seen prior to surgery.Patient pre-op appt scheduled for 07/19/17 @ 11:30am with Ermalinda Barrios, PA. lmom x2 cell and home phone for the patient to call the office to be made aware of the appt.

## 2017-07-19 ENCOUNTER — Encounter: Payer: Self-pay | Admitting: Physician Assistant

## 2017-07-19 ENCOUNTER — Ambulatory Visit (INDEPENDENT_AMBULATORY_CARE_PROVIDER_SITE_OTHER): Payer: Medicare HMO | Admitting: Physician Assistant

## 2017-07-19 VITALS — BP 116/68 | HR 94 | Ht 63.0 in | Wt 122.0 lb

## 2017-07-19 DIAGNOSIS — F1021 Alcohol dependence, in remission: Secondary | ICD-10-CM

## 2017-07-19 DIAGNOSIS — R69 Illness, unspecified: Secondary | ICD-10-CM | POA: Diagnosis not present

## 2017-07-19 DIAGNOSIS — M25551 Pain in right hip: Secondary | ICD-10-CM | POA: Diagnosis not present

## 2017-07-19 DIAGNOSIS — I214 Non-ST elevation (NSTEMI) myocardial infarction: Secondary | ICD-10-CM | POA: Diagnosis not present

## 2017-07-19 DIAGNOSIS — Z01818 Encounter for other preprocedural examination: Secondary | ICD-10-CM | POA: Diagnosis not present

## 2017-07-19 DIAGNOSIS — Z9889 Other specified postprocedural states: Secondary | ICD-10-CM | POA: Diagnosis not present

## 2017-07-19 DIAGNOSIS — I10 Essential (primary) hypertension: Secondary | ICD-10-CM

## 2017-07-19 DIAGNOSIS — E785 Hyperlipidemia, unspecified: Secondary | ICD-10-CM

## 2017-07-19 DIAGNOSIS — M542 Cervicalgia: Secondary | ICD-10-CM | POA: Diagnosis not present

## 2017-07-19 NOTE — Progress Notes (Signed)
Cardiology Office Note    Date:  07/19/2017   ID:  [name], DOB 1960/08/05, MRN [id number]  PCP:  [name], MD  Cardiologist: Candee Furbish, MD  Chief Complaint  Patient presents with  . Pre-op Exam    History of Present Illness:  [name] is a 57 y.o. female history of ASD repair as well as mitral valve repair, prior pulmonary embolus, cardiomyopathy, alcohol abuse with recent relapse and rehab stay, starvation ketoacidosis 11/2015.  12/2015 she presented with cardiac arrest in the setting of aspiration and choking on pills.  She went into acute respiratory failure leading to cardiac arrest after choking on a pill.  CPR was initiated with ROSC and 6 minutes.  She was transferred to ICU intubated and found to have a right lower lobe pneumonia.  Felt she had a N STEMI with troponin peak at 21.83.  She was treated with pressors and IV heparin.  Follow-up Lexiscan Myoview was normal with no ischemia and normal LVEF.  Patient comes in for preop clearance for toe surgery by Dr. Berenice Primas scheduled for 07/25/17.  She has chronic dyspnea on exertion that is unchanged over several years.  She has not very active and has to stop if she goes up a flight of stairs in order to catch her breath.  This is not new.    Past Medical History:  Diagnosis Date  . Accidental drug overdose 10/26/2015  . Acute respiratory failure with hypoxia ([place]) 10/23/2015  . Allergy    SEASONAL  . Anemia   . Anemia, iron deficiency 03/29/2014  . Aortic atherosclerosis ([place])   . Arthritis    "hips, shoulders, hands" (11/17/2016)  . ASD (atrial septal defect)    a. s/p ASD repair in 1973  . Aspiration pneumonia of right lower lobe due to gastric secretions ([place])   . Bronchitis 09/20/2013  . CAD (coronary artery disease)    a. 12/2015: NSTEMI occurring after cardiac arrest secondary to aspiration PNA. Echo w/ EF of 55-60%, no WMA. Outpt ischemic eval needed.  . Cardiac arrest ([place]) 12/22/2015  . Cardiomyopathy     . Chronic bronchitis ([place])   . Chronic lower back pain   . Deaf    "not totally deaf; read lips and can hear some" (11/17/2016)  . Depression   . Diverticulosis   . Dysphagia    due to esophageal infections.  . Fibromyalgia   . Gastritis   . GERD (gastroesophageal reflux disease)   . Hearing impairment   . Hepatic steatosis   . High cholesterol   . History of hiatal hernia   . History of kidney stones   . Hypertension    [name], take htn medication to regulate heart beat.  . Increased anion gap metabolic acidosis 3/79/0240  . Lactic acidosis 12/17/2015  . Major depressive disorder, recurrent episode, moderate ([place]) 09/18/2014  . Meningitis spinal 1967  . Migraine    "all through my life" (11/17/2016)  . Nausea & vomiting 05/20/2014  . Neurocardiogenic syncope 07/19/2016  . Osteoporosis   . Pancytopenia ([place])   . PMB (postmenopausal bleeding) 02/26/2013  . Pneumonia    "several times" (11/17/2016)  . Pressure ulcer 12/18/2015  . 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 Pulmonary embolism ([place]) 2003   occured post c-section of her daughter  . Shock ([place]) 10/19/2015  . Sleep apnea   .  Starvation ketoacidosis 12/17/2015  . Vitamin D deficiency     Past Surgical History:  Procedure Laterality Date  . APPENDECTOMY    . ATRIAL SEPTAL DEFECT(ASD) CLOSURE  1973  . [place]  . CESAREAN SECTION  2003  . COCHLEAR IMPLANT Right   . ESOPHAGOGASTRODUODENOSCOPY (EGD) WITH PROPOFOL N/A 07/20/2016   Procedure: ESOPHAGOGASTRODUODENOSCOPY (EGD) WITH PROPOFOL;  Surgeon: Gatha Mayer, MD;  Location: [facility];  Service: Endoscopy;  Laterality: N/A;  . EYE MUSCLE SURGERY Bilateral   . INCISION AND DRAINAGE OF WOUND  1993   S/P c-section  . LAPAROSCOPIC CHOLECYSTECTOMY  2012  . MITRAL VALVE REPAIR  05/2005  . TONSILLECTOMY AND ADENOIDECTOMY    . TUBAL LIGATION   1993  . TYMPANOSTOMY TUBE PLACEMENT Bilateral "lots"    Current Medications: Current Meds  Medication Sig  . acetaminophen (TYLENOL) 325 MG tablet Take 2 tablets (650 mg total) by mouth every 6 (six) hours as needed for mild pain (or Fever >/= 101).  . Ascorbic Acid (VITAMIN C) 1000 MG tablet Take 1,000 mg by mouth daily.  [name] aspirin 81 MG chewable tablet Chew 81 mg by mouth daily.  [name] atorvastatin (LIPITOR) 40 MG tablet Take 1 tablet (40 mg total) by mouth daily at 6 PM.  . Cholecalciferol (VITAMIN D3) 2000 units TABS Take 4,000 Units by mouth daily.   . fexofenadine (ALLEGRA ALLERGY) 180 MG tablet Take 180 mg by mouth daily.  . fluticasone (FLONASE) 50 MCG/ACT nasal spray Place 2 sprays into both nostrils daily.  . lipase/protease/amylase (CREON) 36000 UNITS CPEP capsule Take 1 capsule (36,000 Units total) by mouth 3 (three) times daily before meals.  . Metoclopramide HCl 5 MG TBDP Take 1 tablet (5 mg total) by mouth 4 (four) times daily -  before meals and at bedtime.  . metoprolol tartrate (LOPRESSOR) 25 MG tablet Take 1 tablet (25 mg total) by mouth 2 (two) times daily.  . mirabegron ER (MYRBETRIQ) 50 MG TB24 tablet Take 1 tablet (50 mg total) by mouth daily.  . nitroGLYCERIN (NITROSTAT) 0.4 MG SL tablet Place 1 tablet (0.4 mg total) under the tongue every 5 (five) minutes as needed for chest pain.  . Omega-3 Fatty Acids (FISH OIL) 1000 MG CAPS Take 1,000 mg by mouth daily.   [name] omeprazole (PRILOSEC) 40 MG capsule Take 40 mg by mouth daily.  . prazosin (MINIPRESS) 2 MG capsule Take 2 mg by mouth at bedtime.   . Prenatal Multivit-Min-Fe-FA (PRENATAL VITAMINS PO) Take 1 tablet by mouth daily.  [name] PRESCRIPTION MEDICATION Inhale into the lungs at bedtime. CPAP  . Vilazodone HCl (VIIBRYD) 40 MG TABS Take 40 mg by mouth daily.   . vitamin B-12 (CYANOCOBALAMIN) 500 MCG tablet Take 500 mcg by mouth daily.   Current Facility-Administered Medications for the 07/19/17 encounter (Office Visit) with  Imogene Burn, PA-C  Medication  . 0.9 %  sodium chloride infusion     Allergies:   Depakote [divalproex sodium]; Diazepam; Valium; and Tizanidine hcl   Social History   Socioeconomic History  . Marital status: Divorced    Spouse name: None  . Number of children: None  . Years of education: None  . Highest education level: None  Social Needs  . Financial resource strain: None  . Food insecurity - worry: None  . Food insecurity - inability: None  . Transportation needs - medical: None  . Transportation needs - non-medical: None  Occupational History  . None  Tobacco Use  .  Smoking status: Never Smoker  . Smokeless tobacco: Never Used  Substance and Sexual Activity  . Alcohol use: Yes    Alcohol/week: 0.0 oz    Comment: Wine tonight with dinner .  [name] Drug use: Yes    Types: Marijuana    Comment: "tried marijuana a few times in college"  . Sexual activity: Not Currently    Birth control/protection: Surgical    Comment: 1st intercourse- 19, partners- 6  Other Topics Concern  . None  Social History Narrative   Lives with 24 year old daughter.         Family History:  The patient's family history includes Breast cancer in her mother; Depression in her sister; Heart attack in her father; Heart disease in her father; Hypertension in her father.   ROS:   Please see the history of present illness.    Review of Systems  Constitution: Positive for malaise/fatigue and weight loss.  HENT: Positive for hearing loss.   Eyes: Negative.   Cardiovascular: Negative.   Respiratory: Positive for snoring.   Hematologic/Lymphatic: Bruises/bleeds easily.  Musculoskeletal: Negative.  Negative for joint pain.  Gastrointestinal: Positive for diarrhea.  Genitourinary: Negative.   Neurological: Positive for headaches and loss of balance.   All other systems reviewed and are negative.   PHYSICAL EXAM:   VS:  BP 116/68   Pulse 94   Ht 5\' 3"  (1.6 m)   Wt 122 lb (55.3 kg)   BMI  21.61 kg/m   Physical Exam  GEN: Thin, looks older than stated age, in no acute distress  Neck: no JVD, carotid bruits, or masses Cardiac:RRR; 5-3/6 systolic murmur the left sternal border Respiratory:  clear to auscultation bilaterally, normal work of breathing GI: soft, nontender, nondistended, + BS Ext: without cyanosis, clubbing, or edema, Good distal pulses bilaterally Neuro:  Alert and Oriented x 3, Psych: euthymic mood, full affect  Wt Readings from Last 3 Encounters:  07/19/17 122 lb (55.3 kg)  05/24/17 119 lb (54 kg)  05/10/17 118 lb (53.5 kg)      Studies/Labs Reviewed:   EKG:  EKG is ordered today.  The ekg ordered today demonstrates normal sinus rhythm prolonged QTc 530 similar to prior tracings  Recent Labs: 02/16/2017: TSH 4.130 03/04/2017: Magnesium 1.2 07/05/2017: ALT 24; BUN 17; Creatinine, Ser 0.67; Hemoglobin 13.7; Platelets 278; Potassium 3.7; Sodium 144   Lipid Panel    Component Value Date/Time   CHOL 215 (H) 02/26/2016 0915   TRIG 237 (H) 02/26/2016 0915   HDL 70 02/26/2016 0915   CHOLHDL 3.1 02/26/2016 0915   VLDL 47 (H) 02/26/2016 0915   LDLCALC 98 02/26/2016 0915    Additional studies/ records that were reviewed today include:   ------------------------------------------------------------------- 2D echo 12/2015 study Conclusions   - Left ventricle: The cavity size was normal. Wall thickness was   normal. Systolic function was normal. The estimated ejection   fraction was in the range of 55% to 60%. Wall motion was normal;   there were no regional wall motion abnormalities. The study is   not technically sufficient to allow evaluation of LV diastolic   function. - Mitral valve: Prior procedures included surgical repair. There   was mild regurgitation. Valve area by pressure half-time: 2.35   cm^2. - Left atrium: The atrium was moderately dilated. - Tricuspid valve: There was moderate regurgitation. - Pulmonary arteries: Systolic pressure  was moderately increased. - Pericardium, extracardiac: A small pericardial effusion was   identified.   Impressions:   -  Normal LV systolic function; s/p MV repair with mild MR; moderate   LAE; moderate TR with moderately elevated pulmonary pressure.  Nuclear stress test 01/2016 Study Highlights      Nuclear stress EF: 75%.  There was no ST segment deviation noted during stress.  The study is normal.  This is a low risk study.  The left ventricular ejection fraction is hyperdynamic (>65%).   Breast attenuation no ischemia or infarction EF 75%        ASSESSMENT:    1. Preoperative clearance   2. History of mitral valve repair   3. NSTEMI (non-ST elevated myocardial infarction) ([place])   4. Essential hypertension   5. Hyperlipidemia, unspecified hyperlipidemia type      PLAN:  In order of problems listed above:  Preoperative clearance before undergoing toe surgery by Dr. Berenice Primas 07/25/17 patient has history of cardiac arrest after choking on a pill in 2017 requiring CPR with ROS C and 6 minutes.  Felt to have a N STEMI with troponin peak of 21.83.  Follow-up nuclear stress test with no ischemia and normal LVEF.  Patient also has history of ASD and mitral valve repair.  She has chronic dyspnea on exertion that is unchanged.  I had a long discussion with Dr. Marlou Porch who concurs that the patient can proceed with surgery without any further cardiac workup.  She is at higher risk because of previous cardiac arrest in the setting of aspiration.  Would be cautious under general anesthesia.  She says she had no trouble when she had her cochlear implants or gallbladder surgery.  That being said her revised cardiac risk index is 0.4 and her functional capacity is 5 METS. According to the Revised Cardiac Risk Index (RCRI), her Perioperative Risk of Major Cardiac Event is (%): 0.4  Her Functional Capacity in METs is: 5.04 according to the Duke Activity Status Index (DASI).  History of  mitral valve repair last 2D echo 2017 mild MR moderate TR.  Patient has chronic dyspnea on exertion that is unchanged  NSTEMI 2017 in the setting of choking on a pill, aspiration requiring CPR see above dictation  Essential hypertension well-controlled on metoprolol  Hyperlipidemia on Lipitor  EtOH with recent relapse and rehab stay   Medication Adjustments/Labs and Tests Ordered: Current medicines are reviewed at length with the patient today.  Concerns regarding medicines are outlined above.  Medication changes, Labs and Tests ordered today are listed in the Patient Instructions below. Patient Instructions  Medication Instructions:  Your physician recommends that you continue on your current medications as directed. Please refer to the Current Medication list given to you today.   Labwork: None ordered  Testing/Procedures: None ordered  Follow-Up: Your physician wants you to follow-up in: [place] will receive a reminder letter in the mail two months in advance. If you don't receive a letter, please call our office to schedule the follow-up appointment.   Any Other Special Instructions Will Be Listed Below (If Applicable).     If you need a refill on your cardiac medications before your next appointment, please call your pharmacy.      Sumner Boast, PA-C  07/19/2017 12:10 PM    [facility] [address], [place], [place]  [address] Phone: [phone]; Fax: [phone]

## 2017-07-19 NOTE — Patient Instructions (Signed)
Medication Instructions:  Your physician recommends that you continue on your current medications as directed. Please refer to the Current Medication list given to you today.  Labwork: None ordered  Testing/Procedures: None ordered  Follow-Up: Your physician wants you to follow-up in: 1 YEAR WITH DR. SKAINS   You will receive a reminder letter in the mail two months in advance. If you don't receive a letter, please call our office to schedule the follow-up appointment.    Any Other Special Instructions Will Be Listed Below (If Applicable).    If you need a refill on your cardiac medications before your next appointment, please call your pharmacy.   

## 2017-07-25 DIAGNOSIS — G8918 Other acute postprocedural pain: Secondary | ICD-10-CM | POA: Diagnosis not present

## 2017-07-25 DIAGNOSIS — M24478 Recurrent dislocation, left toe(s): Secondary | ICD-10-CM | POA: Diagnosis not present

## 2017-07-25 DIAGNOSIS — S93115A Dislocation of interphalangeal joint of left lesser toe(s), initial encounter: Secondary | ICD-10-CM | POA: Diagnosis not present

## 2017-07-26 ENCOUNTER — Ambulatory Visit: Payer: Medicare HMO

## 2017-07-26 DIAGNOSIS — R69 Illness, unspecified: Secondary | ICD-10-CM | POA: Diagnosis not present

## 2017-08-01 ENCOUNTER — Ambulatory Visit: Payer: Medicare HMO | Admitting: Family Medicine

## 2017-08-02 DIAGNOSIS — S93115D Dislocation of interphalangeal joint of left lesser toe(s), subsequent encounter: Secondary | ICD-10-CM | POA: Diagnosis not present

## 2017-08-02 DIAGNOSIS — R69 Illness, unspecified: Secondary | ICD-10-CM | POA: Diagnosis not present

## 2017-08-09 ENCOUNTER — Encounter: Payer: Self-pay | Admitting: Physician Assistant

## 2017-08-09 DIAGNOSIS — Z9889 Other specified postprocedural states: Secondary | ICD-10-CM | POA: Diagnosis not present

## 2017-08-09 DIAGNOSIS — R69 Illness, unspecified: Secondary | ICD-10-CM | POA: Diagnosis not present

## 2017-08-15 ENCOUNTER — Encounter (HOSPITAL_COMMUNITY): Payer: Self-pay | Admitting: *Deleted

## 2017-08-15 ENCOUNTER — Other Ambulatory Visit: Payer: Self-pay

## 2017-08-15 ENCOUNTER — Emergency Department (HOSPITAL_COMMUNITY)
Admission: EM | Admit: 2017-08-15 | Discharge: 2017-08-16 | Disposition: A | Discharge status: Home / Self Care | Payer: Medicare HMO | Attending: Emergency Medicine | Admitting: Emergency Medicine

## 2017-08-15 DIAGNOSIS — F1010 Alcohol abuse, uncomplicated: Secondary | ICD-10-CM

## 2017-08-15 DIAGNOSIS — R1013 Epigastric pain: Secondary | ICD-10-CM | POA: Diagnosis not present

## 2017-08-15 DIAGNOSIS — F10121 Alcohol abuse with intoxication delirium: Secondary | ICD-10-CM | POA: Diagnosis not present

## 2017-08-15 DIAGNOSIS — R69 Illness, unspecified: Secondary | ICD-10-CM | POA: Diagnosis not present

## 2017-08-15 DIAGNOSIS — K760 Fatty (change of) liver, not elsewhere classified: Secondary | ICD-10-CM | POA: Diagnosis not present

## 2017-08-15 DIAGNOSIS — Z7982 Long term (current) use of aspirin: Secondary | ICD-10-CM | POA: Diagnosis not present

## 2017-08-15 DIAGNOSIS — K449 Diaphragmatic hernia without obstruction or gangrene: Secondary | ICD-10-CM | POA: Diagnosis not present

## 2017-08-15 DIAGNOSIS — R112 Nausea with vomiting, unspecified: Secondary | ICD-10-CM

## 2017-08-15 DIAGNOSIS — Z79899 Other long term (current) drug therapy: Secondary | ICD-10-CM | POA: Diagnosis not present

## 2017-08-15 DIAGNOSIS — I2510 Atherosclerotic heart disease of native coronary artery without angina pectoris: Secondary | ICD-10-CM | POA: Diagnosis not present

## 2017-08-15 DIAGNOSIS — I10 Essential (primary) hypertension: Secondary | ICD-10-CM | POA: Insufficient documentation

## 2017-08-15 DIAGNOSIS — R1010 Upper abdominal pain, unspecified: Secondary | ICD-10-CM | POA: Diagnosis not present

## 2017-08-15 LAB — COMPREHENSIVE METABOLIC PANEL
ALT: 35 U/L (ref 14–54)
AST: 68 U/L — AB (ref 15–41)
Albumin: 4.5 g/dL (ref 3.5–5.0)
Alkaline Phosphatase: 95 U/L (ref 38–126)
Anion gap: 26 — ABNORMAL HIGH (ref 5–15)
BUN: 15 mg/dL (ref 6–20)
CHLORIDE: 95 mmol/L — AB (ref 101–111)
CO2: 11 mmol/L — AB (ref 22–32)
CREATININE: 1.3 mg/dL — AB (ref 0.44–1.00)
Calcium: 9.1 mg/dL (ref 8.9–10.3)
GFR calc Af Amer: 52 mL/min — ABNORMAL LOW (ref >60)
GFR, EST NON AFRICAN AMERICAN: 45 mL/min — AB (ref >60)
Glucose, Bld: 107 mg/dL — ABNORMAL HIGH (ref 65–99)
Potassium: 4.1 mmol/L (ref 3.5–5.1)
SODIUM: 132 mmol/L — AB (ref 135–145)
Total Bilirubin: 2.5 mg/dL — ABNORMAL HIGH (ref 0.3–1.2)
Total Protein: 7.6 g/dL (ref 6.5–8.1)

## 2017-08-15 LAB — ETHANOL: Alcohol, Ethyl (B): <10 mg/dL (ref <10)

## 2017-08-15 LAB — CBC
HCT: 41.4 % (ref 36.0–46.0)
Hemoglobin: 13.9 g/dL (ref 12.0–15.0)
MCH: 32 pg (ref 26.0–34.0)
MCHC: 33.6 g/dL (ref 30.0–36.0)
MCV: 95.2 fL (ref 78.0–100.0)
Platelets: 124 10*3/uL — ABNORMAL LOW (ref 150–400)
RBC: 4.35 MIL/uL (ref 3.87–5.11)
RDW: 15.3 % (ref 11.5–15.5)
WBC: 4.3 10*3/uL (ref 4.0–10.5)

## 2017-08-15 LAB — URINALYSIS, ROUTINE W REFLEX MICROSCOPIC
Bacteria, UA: NONE SEEN
Bilirubin Urine: NEGATIVE
Glucose, UA: NEGATIVE mg/dL
Ketones, ur: 80 mg/dL — AB
Nitrite: NEGATIVE
PH: 6 (ref 5.0–8.0)
PROTEIN: 100 mg/dL — AB
Specific Gravity, Urine: 1.026 (ref 1.005–1.030)

## 2017-08-15 LAB — LIPASE, BLOOD: Lipase: 77 U/L — ABNORMAL HIGH (ref 11–51)

## 2017-08-15 MED ORDER — ONDANSETRON 4 MG PO TBDP
4.0000 mg | ORAL_TABLET | Freq: Once | ORAL | Status: AC | PRN
  Administered 2017-08-15: 4 mg via ORAL
  Filled 2017-08-15: qty 1

## 2017-08-15 MED ORDER — MORPHINE SULFATE (PF) 4 MG/ML IV SOLN
4.0000 mg | Freq: Once | INTRAVENOUS | Status: AC
  Administered 2017-08-15: 4 mg via INTRAVENOUS
  Filled 2017-08-15: qty 1

## 2017-08-15 MED ORDER — SODIUM CHLORIDE 0.9 % IV BOLUS
1000.0000 mL | Freq: Once | INTRAVENOUS | Status: AC
  Administered 2017-08-15: 1000 mL via INTRAVENOUS

## 2017-08-15 MED ORDER — ONDANSETRON HCL 4 MG/2ML IJ SOLN
4.0000 mg | Freq: Once | INTRAMUSCULAR | Status: AC
  Administered 2017-08-15: 4 mg via INTRAVENOUS
  Filled 2017-08-15: qty 2

## 2017-08-15 NOTE — ED Notes (Signed)
ED Provider at bedside. 

## 2017-08-15 NOTE — ED Triage Notes (Signed)
Per pt, she last drank alcohol on Saturday. Pt is having mid abd pain with n/v. States she had blood tinged emesis. Has sign language interpretor at triage.

## 2017-08-15 NOTE — ED Provider Notes (Signed)
[facility] EMERGENCY DEPARTMENT Provider Note   CSN: [id number] Arrival date & time: 08/15/17  1107     History   Chief Complaint Chief Complaint  Patient presents with  . Alcohol Problem  . Emesis  . Abdominal Pain    HPI [name] is a 57 y.o. female.  Pt presents to the ED today for alcohol withdrawal.  She has a hx of alcohol abuse and has been intermittently sober.  She started drinking again recently, but last drank vodka on Saturday, 4/6.  The pt feels like she is withdrawing because she is very shaky.  She c/o n/v and upper abdominal pain.  She has not had a seizure, but is worried that she would have one with her withdrawal.     Past Medical History:  Diagnosis Date  . Accidental drug overdose 10/26/2015  . Acute respiratory failure with hypoxia ([place]) 10/23/2015  . Allergy    SEASONAL  . Anemia   . Anemia, iron deficiency 03/29/2014  . Aortic atherosclerosis ([place])   . Arthritis    "hips, shoulders, hands" (11/17/2016)  . ASD (atrial septal defect)    a. s/p ASD repair in 1973  . Aspiration pneumonia of right lower lobe due to gastric secretions ([place])   . Bronchitis 09/20/2013  . CAD (coronary artery disease)    a. 12/2015: NSTEMI occurring after cardiac arrest secondary to aspiration PNA. Echo w/ EF of 55-60%, no WMA. Outpt ischemic eval needed.  . Cardiac arrest ([place]) 12/22/2015  . Cardiomyopathy   . Chronic bronchitis ([place])   . Chronic lower back pain   . Deaf    "not totally deaf; read lips and can hear some" (11/17/2016)  . Depression   . Diverticulosis   . Dysphagia    due to esophageal infections.  . Fibromyalgia   . Gastritis   . GERD (gastroesophageal reflux disease)   . Hearing impairment   . Hepatic steatosis   . High cholesterol   . History of hiatal hernia   . History of kidney stones   . Hypertension    [name], take htn medication to regulate heart beat.  . Increased anion gap metabolic acidosis 9/51/8841  . Lactic  acidosis 12/17/2015  . Major depressive disorder, recurrent episode, moderate ([place]) 09/18/2014  . Meningitis spinal 1967  . Migraine    "all through my life" (11/17/2016)  . Nausea & vomiting 05/20/2014  . Neurocardiogenic syncope 07/19/2016  . Osteoporosis   . Pancytopenia ([place])   . PMB (postmenopausal bleeding) 02/26/2013  . Pneumonia    "several times" (11/17/2016)  . Pressure ulcer 12/18/2015  . 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 Pulmonary embolism ([place]) 2003   occured post c-section of her daughter  . Shock ([place]) 10/19/2015  . Sleep apnea   . Starvation ketoacidosis 12/17/2015  . Vitamin D deficiency     Patient Active Problem List   Diagnosis Date Noted  . Chronic pancreatitis due to acute alcohol intoxication ([place]) 11/17/2016  . Candida infection, esophageal ([place])   . Esophageal ring, acquired   . Abdominal pain, epigastric   . Bradycardia   . Hypokalemia   . NSTEMI (non-ST elevated myocardial infarction) ([place]) 12/19/2015  . Typical atrial flutter ([place])   . Protein-calorie malnutrition, severe 12/18/2015  . Alcohol abuse 12/17/2015  . Essential hypertension 12/06/2015  . Non-traumatic compression fracture of vertebral column  with routine healing 07/17/2014  . Osteoporosis 07/17/2014  . Odynophagia 07/09/2014  . Esophageal dysphagia 07/08/2014  . Chest pain 05/20/2014  . 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 Hypercholesterolemia without hypertriglyceridemia 03/29/2014  . Fatigue 03/29/2014  . Classical migraine with intractable migraine 03/29/2014  . Disorder of mitral valve 03/29/2014  . Atypical migraine 03/29/2014  . Atrial septal defect of fossa ovalis 03/29/2014  . Arthralgia of multiple joints 03/29/2014  . Fibrositis 03/29/2014  . Abdominal pain 12/20/2013  . Absolute anemia 09/20/2013  . Family history of  breast cancer 07/13/2013  . Palpitation 07/02/2013  . History of mitral valve repair 07/02/2013  . Status post patch closure of ASD 07/02/2013  . Hyperlipidemia 07/02/2013  . OAB (overactive bladder) 06/13/2013  . Ovarian cyst, left 03/15/2013  . Vaginal lesion 03/15/2013  . Ovarian mass 03/15/2013  . Maternal DVT (deep vein thrombosis), history of 02/26/2013  . Alcohol dependence ([place]) 11/07/2012  . Unspecified vitamin D deficiency 08/17/2012  . L1 vertebral fracture ([place]) 08/01/2012  . Spinal compression fracture ([place]) 07/12/2012  . Menopausal state 07/12/2012  . Psoriasis 07/12/2012  . Severe episode of recurrent major depressive disorder ([place]) 12/07/2011  . Cardiomyopathy 03/16/2011  . Hearing impairment   . Arthritis     Past Surgical History:  Procedure Laterality Date  . APPENDECTOMY    . ATRIAL SEPTAL DEFECT(ASD) CLOSURE  1973  . [place]  . CESAREAN SECTION  2003  . COCHLEAR IMPLANT Right   . ESOPHAGOGASTRODUODENOSCOPY (EGD) WITH PROPOFOL N/A 07/20/2016   Procedure: ESOPHAGOGASTRODUODENOSCOPY (EGD) WITH PROPOFOL;  Surgeon: Gatha Mayer, MD;  Location: [facility];  Service: Endoscopy;  Laterality: N/A;  . EYE MUSCLE SURGERY Bilateral   . INCISION AND DRAINAGE OF WOUND  1993   S/P c-section  . LAPAROSCOPIC CHOLECYSTECTOMY  2012  . MITRAL VALVE REPAIR  05/2005  . TONSILLECTOMY AND ADENOIDECTOMY    . TUBAL LIGATION  1993  . TYMPANOSTOMY TUBE PLACEMENT Bilateral "lots"     OB History    Gravida  2   Para  2   Term  2   Preterm  0   AB  0   Living        SAB  0   TAB  0   Ectopic  0   Multiple      Live Births               Home Medications    Prior to Admission medications   Medication Sig Start Date End Date Taking? Authorizing Provider  acetaminophen (TYLENOL) 325 MG tablet Take 2 tablets (650 mg total) by mouth every 6 (six) hours as needed for mild pain (or Fever >/= 101). 11/20/16   [name], MD    Ascorbic Acid (VITAMIN C) 1000 MG tablet Take 1,000 mg by mouth daily.    [provider]  aspirin 81 MG chewable tablet Chew 81 mg by mouth daily.    [provider]  atorvastatin (LIPITOR) 40 MG tablet Take 1 tablet (40 mg total) by mouth daily at 6 PM. 03/09/17   [name], MD  Cholecalciferol (VITAMIN D3) 2000 units TABS Take 4,000 Units by mouth daily.     [provider]  fexofenadine (ALLEGRA ALLERGY) 180 MG tablet Take 180 mg by mouth daily.    [provider]  fluticasone (FLONASE) 50 MCG/ACT nasal spray Place 2 sprays into both nostrils daily.    [provider]  lipase/protease/amylase (CREON) 36000 UNITS CPEP capsule Take 1 capsule (36,000 Units total) by mouth 3 (three) times daily before meals. 02/16/17   [name], MD  Metoclopramide HCl 5 MG TBDP Take 1 tablet (5 mg total) by mouth 4 (four) times daily -  before meals and at bedtime. 02/16/17   [name], MD  metoprolol tartrate (LOPRESSOR) 25 MG tablet Take 1 tablet (25 mg total) by mouth 2 (two) times daily. 03/09/17   [name], MD  mirabegron ER (MYRBETRIQ) 50 MG TB24 tablet Take 1 tablet (50 mg total) by mouth daily. 04/30/17   [name], MD  nitroGLYCERIN (NITROSTAT) 0.4 MG SL tablet Place 1 tablet (0.4 mg total) under the tongue every 5 (five) minutes as needed for chest pain. 05/11/16   [name], MD  Omega-3 Fatty Acids (FISH OIL) 1000 MG CAPS Take 1,000 mg by mouth daily.     [provider]  omeprazole (PRILOSEC) 40 MG capsule Take 40 mg by mouth daily.    [provider]  prazosin (MINIPRESS) 2 MG capsule Take 2 mg by mouth at bedtime.  02/17/16   [provider]  Prenatal Multivit-Min-Fe-FA (PRENATAL VITAMINS PO) Take 1 tablet by mouth daily.    [provider]  PRESCRIPTION MEDICATION Inhale into the lungs at bedtime. CPAP    [provider]  Vilazodone HCl (VIIBRYD) 40 MG TABS Take 40 mg by mouth daily.     [provider]  vitamin B-12 (CYANOCOBALAMIN) 500 MCG tablet Take 500 mcg by mouth daily.    [provider]    Family History Family History  Problem Relation Age of Onset  . Breast cancer Mother        bilateral; ages 77 and 29; TAH/BSO ~50  . Depression Sister   . Heart disease Father   . Hypertension Father   . Heart attack Father   . Colon cancer Neg Hx   . Esophageal cancer Neg Hx   . Pancreatic cancer Neg Hx   . Rectal cancer Neg Hx   . Stomach cancer Neg Hx     Social History Social History   Tobacco Use  . Smoking status: Never Smoker  . Smokeless tobacco: Never Used  Substance Use Topics  . Alcohol use: Yes    Alcohol/week: 0.0 oz    Comment: Wine tonight with dinner .  [name] Drug use: Yes    Types: Marijuana    Comment: "tried marijuana a few times in college"     Allergies   Depakote [divalproex sodium]; Diazepam; Valium; and Tizanidine hcl   Review of Systems Review of Systems  Gastrointestinal: Positive for abdominal pain, nausea and vomiting.  Neurological: Positive for tremors and weakness.  All other systems reviewed and are negative.    Physical Exam Updated Vital Signs BP 112/79   Pulse 81   Temp 98.3 F (36.8 C) (Oral)   Resp 15   SpO2 100%   Physical Exam  Constitutional: She is oriented to person, place, and time. She appears well-developed and well-nourished.  HENT:  Head: Normocephalic and atraumatic.  Mouth/Throat: Mucous membranes are dry.  Eyes: Pupils are equal, round, and reactive to light. EOM are normal.  Cardiovascular: Normal rate, regular rhythm, normal heart sounds and intact distal pulses.  Pulmonary/Chest: Effort normal and breath sounds normal.  Abdominal: Soft. Normal appearance and bowel sounds are normal. There is tenderness in the epigastric area.  Neurological: She is alert and oriented to  person, place, and time. She displays tremor.  Skin: Skin is warm and dry. Capillary refill takes less than 2  seconds.  Psychiatric: She has a normal mood and affect. Her behavior is normal.  Nursing note and vitals reviewed.    ED Treatments / Results  Labs (all labs ordered are listed, but only abnormal results are displayed) Labs Reviewed  LIPASE, BLOOD - Abnormal; Notable for the following components:      Result Value   Lipase 77 (*)    All other components within normal limits  COMPREHENSIVE METABOLIC PANEL - Abnormal; Notable for the following components:   Sodium 132 (*)    Chloride 95 (*)    CO2 11 (*)    Glucose, Bld 107 (*)    Creatinine, Ser 1.30 (*)    AST 68 (*)    Total Bilirubin 2.5 (*)    GFR calc non Af Amer 45 (*)    GFR calc Af Amer 52 (*)    Anion gap 26 (*)    All other components within normal limits  CBC - Abnormal; Notable for the following components:   Platelets 124 (*)    All other components within normal limits  URINALYSIS, ROUTINE W REFLEX MICROSCOPIC - Abnormal; Notable for the following components:   Hgb urine dipstick MODERATE (*)    Ketones, ur 80 (*)    Protein, ur 100 (*)    Leukocytes, UA MODERATE (*)    Squamous Epithelial / LPF 0-5 (*)    All other components within normal limits  ETHANOL    EKG None  Radiology No results found.  Procedures Procedures (including critical care time)  Medications Ordered in ED Medications  morphine 4 MG/ML injection 4 mg (has no administration in time range)  ondansetron (ZOFRAN-ODT) disintegrating tablet 4 mg (4 mg Oral Given 08/15/17 1351)  sodium chloride 0.9 % bolus 1,000 mL (0 mLs Intravenous Stopped 08/15/17 2132)  ondansetron (ZOFRAN) injection 4 mg (4 mg Intravenous Given 08/15/17 1955)  morphine 4 MG/ML injection 4 mg (4 mg Intravenous Given 08/15/17 1955)     Initial Impression / Assessment and Plan / ED Course  I have reviewed the triage vital signs and the nursing notes.  Pertinent labs & imaging results that were available during my care of the patient were reviewed by me and considered  in my medical decision making (see chart for details).    Pt is still c/o epigastric pain and with mild elevation in lipase, so I ordered a CT abd/pelvis.  Pt to be signed out to Dr. Randal Buba.  Final Clinical Impressions(s) / ED Diagnoses   Final diagnoses:  Alcohol abuse  Epigastric pain  Non-intractable vomiting with nausea, unspecified vomiting type    ED Discharge Orders    None       Isla Pence, MD 08/15/17 2330

## 2017-08-15 NOTE — ED Triage Notes (Signed)
Per ems- She is coming from home, sitting in her kitchen c/o ETOH withdrawal, last drank on Saturday. N/v, tremors, abdominal pain. EKG SR. VSS, 114/76, HR 74, 94 CBG, 96% RA. She is hearing impaired.

## 2017-08-15 NOTE — ED Notes (Signed)
[employer] left to another job @1434 -aware of VIR machine when need for patient-per RN.

## 2017-08-16 ENCOUNTER — Emergency Department (HOSPITAL_COMMUNITY): Payer: Medicare HMO

## 2017-08-16 DIAGNOSIS — R1013 Epigastric pain: Secondary | ICD-10-CM | POA: Diagnosis not present

## 2017-08-16 DIAGNOSIS — K760 Fatty (change of) liver, not elsewhere classified: Secondary | ICD-10-CM | POA: Diagnosis not present

## 2017-08-16 DIAGNOSIS — Z79899 Other long term (current) drug therapy: Secondary | ICD-10-CM | POA: Diagnosis not present

## 2017-08-16 DIAGNOSIS — I10 Essential (primary) hypertension: Secondary | ICD-10-CM | POA: Diagnosis not present

## 2017-08-16 DIAGNOSIS — I2510 Atherosclerotic heart disease of native coronary artery without angina pectoris: Secondary | ICD-10-CM | POA: Diagnosis not present

## 2017-08-16 DIAGNOSIS — Z7982 Long term (current) use of aspirin: Secondary | ICD-10-CM | POA: Diagnosis not present

## 2017-08-16 DIAGNOSIS — R112 Nausea with vomiting, unspecified: Secondary | ICD-10-CM | POA: Diagnosis not present

## 2017-08-16 DIAGNOSIS — R69 Illness, unspecified: Secondary | ICD-10-CM | POA: Diagnosis not present

## 2017-08-16 MED ORDER — ONDANSETRON 8 MG PO TBDP: ORAL_TABLET | ORAL | 0 refills | Status: DC

## 2017-08-16 MED ORDER — OMEPRAZOLE 20 MG PO CPDR: 20.0000 mg | DELAYED_RELEASE_CAPSULE | Freq: Every day | ORAL | 0 refills | Status: AC

## 2017-08-16 MED ORDER — IOPAMIDOL (ISOVUE-300) INJECTION 61%
100.0000 mL | Freq: Once | INTRAVENOUS | Status: AC | PRN
  Administered 2017-08-16: 80 mL via INTRAVENOUS

## 2017-08-16 NOTE — ED Notes (Signed)
Patient transported to CT 

## 2017-08-16 NOTE — ED Notes (Signed)
Pt given bus voucher. This RN attempted to call family who reported they cannot come get her.

## 2017-08-23 DIAGNOSIS — R69 Illness, unspecified: Secondary | ICD-10-CM | POA: Diagnosis not present

## 2017-08-24 DIAGNOSIS — Z1231 Encounter for screening mammogram for malignant neoplasm of breast: Secondary | ICD-10-CM | POA: Diagnosis not present

## 2017-08-24 DIAGNOSIS — Z803 Family history of malignant neoplasm of breast: Secondary | ICD-10-CM | POA: Diagnosis not present

## 2017-08-27 ENCOUNTER — Other Ambulatory Visit: Payer: Self-pay

## 2017-08-27 ENCOUNTER — Ambulatory Visit (INDEPENDENT_AMBULATORY_CARE_PROVIDER_SITE_OTHER): Payer: Medicare HMO

## 2017-08-27 ENCOUNTER — Encounter: Payer: Self-pay | Admitting: Physician Assistant

## 2017-08-27 ENCOUNTER — Observation Stay (HOSPITAL_COMMUNITY)
Admission: EM | Admit: 2017-08-27 | Discharge: 2017-08-28 | Disposition: A | Discharge status: Home / Self Care | Payer: Medicare HMO | Attending: Internal Medicine | Admitting: Internal Medicine

## 2017-08-27 ENCOUNTER — Emergency Department (HOSPITAL_COMMUNITY): Payer: Medicare HMO

## 2017-08-27 ENCOUNTER — Encounter (HOSPITAL_COMMUNITY): Payer: Self-pay | Admitting: Emergency Medicine

## 2017-08-27 ENCOUNTER — Ambulatory Visit (INDEPENDENT_AMBULATORY_CARE_PROVIDER_SITE_OTHER): Payer: Medicare HMO | Admitting: Physician Assistant

## 2017-08-27 VITALS — BP 129/78 | HR 98 | Temp 98.9°F | Resp 16 | Ht 64.0 in | Wt 122.2 lb

## 2017-08-27 DIAGNOSIS — E161 Other hypoglycemia: Secondary | ICD-10-CM | POA: Diagnosis not present

## 2017-08-27 DIAGNOSIS — Z8674 Personal history of sudden cardiac arrest: Secondary | ICD-10-CM | POA: Insufficient documentation

## 2017-08-27 DIAGNOSIS — S0003XA Contusion of scalp, initial encounter: Secondary | ICD-10-CM | POA: Insufficient documentation

## 2017-08-27 DIAGNOSIS — D649 Anemia, unspecified: Secondary | ICD-10-CM | POA: Insufficient documentation

## 2017-08-27 DIAGNOSIS — I4581 Long QT syndrome: Secondary | ICD-10-CM | POA: Diagnosis not present

## 2017-08-27 DIAGNOSIS — R251 Tremor, unspecified: Secondary | ICD-10-CM

## 2017-08-27 DIAGNOSIS — G4730 Sleep apnea, unspecified: Secondary | ICD-10-CM | POA: Insufficient documentation

## 2017-08-27 DIAGNOSIS — F339 Major depressive disorder, recurrent, unspecified: Secondary | ICD-10-CM | POA: Diagnosis not present

## 2017-08-27 DIAGNOSIS — K219 Gastro-esophageal reflux disease without esophagitis: Secondary | ICD-10-CM | POA: Diagnosis not present

## 2017-08-27 DIAGNOSIS — F1010 Alcohol abuse, uncomplicated: Secondary | ICD-10-CM | POA: Diagnosis not present

## 2017-08-27 DIAGNOSIS — K861 Other chronic pancreatitis: Secondary | ICD-10-CM | POA: Diagnosis not present

## 2017-08-27 DIAGNOSIS — I429 Cardiomyopathy, unspecified: Secondary | ICD-10-CM | POA: Diagnosis not present

## 2017-08-27 DIAGNOSIS — R69 Illness, unspecified: Secondary | ICD-10-CM | POA: Diagnosis not present

## 2017-08-27 DIAGNOSIS — Z79899 Other long term (current) drug therapy: Secondary | ICD-10-CM | POA: Diagnosis not present

## 2017-08-27 DIAGNOSIS — F10230 Alcohol dependence with withdrawal, uncomplicated: Secondary | ICD-10-CM | POA: Diagnosis not present

## 2017-08-27 DIAGNOSIS — Z86711 Personal history of pulmonary embolism: Secondary | ICD-10-CM | POA: Insufficient documentation

## 2017-08-27 DIAGNOSIS — W19XXXA Unspecified fall, initial encounter: Secondary | ICD-10-CM | POA: Diagnosis not present

## 2017-08-27 DIAGNOSIS — S199XXA Unspecified injury of neck, initial encounter: Secondary | ICD-10-CM | POA: Diagnosis not present

## 2017-08-27 DIAGNOSIS — Y92009 Unspecified place in unspecified non-institutional (private) residence as the place of occurrence of the external cause: Secondary | ICD-10-CM | POA: Insufficient documentation

## 2017-08-27 DIAGNOSIS — R55 Syncope and collapse: Secondary | ICD-10-CM | POA: Diagnosis not present

## 2017-08-27 DIAGNOSIS — F10939 Alcohol use, unspecified with withdrawal, unspecified: Secondary | ICD-10-CM

## 2017-08-27 DIAGNOSIS — H9190 Unspecified hearing loss, unspecified ear: Secondary | ICD-10-CM | POA: Diagnosis not present

## 2017-08-27 DIAGNOSIS — I2510 Atherosclerotic heart disease of native coronary artery without angina pectoris: Secondary | ICD-10-CM | POA: Diagnosis not present

## 2017-08-27 DIAGNOSIS — J302 Other seasonal allergic rhinitis: Secondary | ICD-10-CM | POA: Insufficient documentation

## 2017-08-27 DIAGNOSIS — Z9114 Patient's other noncompliance with medication regimen: Secondary | ICD-10-CM | POA: Diagnosis not present

## 2017-08-27 DIAGNOSIS — Z7982 Long term (current) use of aspirin: Secondary | ICD-10-CM | POA: Insufficient documentation

## 2017-08-27 DIAGNOSIS — R2681 Unsteadiness on feet: Secondary | ICD-10-CM | POA: Diagnosis not present

## 2017-08-27 DIAGNOSIS — E162 Hypoglycemia, unspecified: Secondary | ICD-10-CM

## 2017-08-27 DIAGNOSIS — I252 Old myocardial infarction: Secondary | ICD-10-CM | POA: Diagnosis not present

## 2017-08-27 DIAGNOSIS — R413 Other amnesia: Secondary | ICD-10-CM

## 2017-08-27 DIAGNOSIS — E7800 Pure hypercholesterolemia, unspecified: Secondary | ICD-10-CM | POA: Insufficient documentation

## 2017-08-27 DIAGNOSIS — F10239 Alcohol dependence with withdrawal, unspecified: Secondary | ICD-10-CM

## 2017-08-27 DIAGNOSIS — M542 Cervicalgia: Secondary | ICD-10-CM | POA: Diagnosis not present

## 2017-08-27 LAB — BASIC METABOLIC PANEL
Anion gap: 16 — ABNORMAL HIGH (ref 5–15)
BUN: 18 mg/dL (ref 6–20)
CALCIUM: 7.7 mg/dL — AB (ref 8.9–10.3)
CO2: 19 mmol/L — ABNORMAL LOW (ref 22–32)
CREATININE: 0.71 mg/dL (ref 0.44–1.00)
Chloride: 101 mmol/L (ref 101–111)
GFR calc non Af Amer: >60 mL/min (ref >60)
Glucose, Bld: 186 mg/dL — ABNORMAL HIGH (ref 65–99)
Potassium: 4.4 mmol/L (ref 3.5–5.1)
SODIUM: 136 mmol/L (ref 135–145)

## 2017-08-27 LAB — CBC WITH DIFFERENTIAL/PLATELET
BASOS PCT: 1 %
Basophils Absolute: 0 10*3/uL (ref 0.0–0.1)
EOS ABS: 0 10*3/uL (ref 0.0–0.7)
EOS PCT: 0 %
HCT: 31.8 % — ABNORMAL LOW (ref 36.0–46.0)
Hemoglobin: 10.8 g/dL — ABNORMAL LOW (ref 12.0–15.0)
Lymphocytes Relative: 15 %
Lymphs Abs: 0.6 10*3/uL — ABNORMAL LOW (ref 0.7–4.0)
MCH: 31.6 pg (ref 26.0–34.0)
MCHC: 34 g/dL (ref 30.0–36.0)
MCV: 93 fL (ref 78.0–100.0)
MONO ABS: 0.3 10*3/uL (ref 0.1–1.0)
MONOS PCT: 6 %
Neutro Abs: 3.3 10*3/uL (ref 1.7–7.7)
Neutrophils Relative %: 78 %
Platelets: 278 10*3/uL (ref 150–400)
RBC: 3.42 MIL/uL — ABNORMAL LOW (ref 3.87–5.11)
RDW: 15 % (ref 11.5–15.5)
WBC: 4.2 10*3/uL (ref 4.0–10.5)

## 2017-08-27 LAB — ETHANOL: ALCOHOL ETHYL (B): 25 mg/dL — AB (ref <10)

## 2017-08-27 LAB — CBG MONITORING, ED
GLUCOSE-CAPILLARY: 169 mg/dL — AB (ref 65–99)
Glucose-Capillary: 50 mg/dL — ABNORMAL LOW (ref 65–99)
Glucose-Capillary: 57 mg/dL — ABNORMAL LOW (ref 65–99)

## 2017-08-27 LAB — GLUCOSE, POCT (MANUAL RESULT ENTRY): POC Glucose: 19 mg/dl — AB (ref 70–99)

## 2017-08-27 MED ORDER — THIAMINE HCL 100 MG/ML IJ SOLN
100.0000 mg | Freq: Every day | INTRAMUSCULAR | Status: DC
  Administered 2017-08-27: 100 mg via INTRAVENOUS
  Filled 2017-08-27: qty 2

## 2017-08-27 MED ORDER — THIAMINE HCL 100 MG/ML IJ SOLN
Freq: Once | INTRAVENOUS | Status: AC
  Administered 2017-08-28: 02:00:00 via INTRAVENOUS
  Filled 2017-08-27: qty 1000

## 2017-08-27 MED ORDER — VILAZODONE HCL 40 MG PO TABS
40.0000 mg | ORAL_TABLET | Freq: Every day | ORAL | Status: DC
  Administered 2017-08-28: 40 mg via ORAL
  Filled 2017-08-27: qty 1

## 2017-08-27 MED ORDER — LORAZEPAM 2 MG/ML IJ SOLN
0.0000 mg | Freq: Four times a day (QID) | INTRAMUSCULAR | Status: DC
  Administered 2017-08-27: 2 mg via INTRAVENOUS
  Filled 2017-08-27 (×3): qty 1

## 2017-08-27 MED ORDER — SODIUM CHLORIDE 0.9 % IV BOLUS
1000.0000 mL | Freq: Once | INTRAVENOUS | Status: AC
  Administered 2017-08-27: 1000 mL via INTRAVENOUS

## 2017-08-27 MED ORDER — SODIUM CHLORIDE 0.9 % IV SOLN
2.0000 g | Freq: Once | INTRAVENOUS | Status: AC
  Administered 2017-08-28: 2 g via INTRAVENOUS
  Filled 2017-08-27: qty 20

## 2017-08-27 MED ORDER — LORAZEPAM 1 MG PO TABS: 0.0000 mg | ORAL_TABLET | Freq: Two times a day (BID) | ORAL | Status: DC

## 2017-08-27 MED ORDER — MAGNESIUM SULFATE 2 GM/50ML IV SOLN
2.0000 g | Freq: Once | INTRAVENOUS | Status: AC
  Administered 2017-08-28: 2 g via INTRAVENOUS
  Filled 2017-08-27: qty 50

## 2017-08-27 MED ORDER — VITAMIN B-1 100 MG PO TABS: 100.0000 mg | ORAL_TABLET | Freq: Every day | ORAL | Status: DC

## 2017-08-27 MED ORDER — FENTANYL CITRATE (PF) 100 MCG/2ML IJ SOLN
50.0000 ug | Freq: Once | INTRAMUSCULAR | Status: AC
  Administered 2017-08-27: 50 ug via INTRAVENOUS
  Filled 2017-08-27: qty 2

## 2017-08-27 MED ORDER — LORAZEPAM 2 MG/ML IJ SOLN
1.0000 mg | Freq: Once | INTRAMUSCULAR | Status: AC
  Administered 2017-08-27: 1 mg via INTRAVENOUS

## 2017-08-27 MED ORDER — DEXTROSE 50 % IV SOLN
1.0000 | Freq: Once | INTRAVENOUS | Status: AC
  Administered 2017-08-27: 50 mL via INTRAVENOUS
  Filled 2017-08-27: qty 50

## 2017-08-27 MED ORDER — IBUPROFEN 400 MG PO TABS
400.0000 mg | ORAL_TABLET | Freq: Four times a day (QID) | ORAL | Status: DC | PRN
  Administered 2017-08-28: 400 mg via ORAL
  Filled 2017-08-27: qty 1

## 2017-08-27 MED ORDER — MORPHINE SULFATE (PF) 4 MG/ML IV SOLN
2.0000 mg | Freq: Once | INTRAVENOUS | Status: AC
  Administered 2017-08-28: 2 mg via INTRAVENOUS
  Filled 2017-08-27: qty 1

## 2017-08-27 MED ORDER — FOLIC ACID 1 MG PO TABS: 1.0000 mg | ORAL_TABLET | Freq: Every day | ORAL | Status: DC

## 2017-08-27 MED ORDER — ONDANSETRON HCL 4 MG/2ML IJ SOLN
4.0000 mg | Freq: Once | INTRAMUSCULAR | Status: AC
  Administered 2017-08-27: 4 mg via INTRAVENOUS
  Filled 2017-08-27: qty 2

## 2017-08-27 MED ORDER — LORAZEPAM 1 MG PO TABS: 0.0000 mg | ORAL_TABLET | Freq: Four times a day (QID) | ORAL | Status: DC

## 2017-08-27 MED ORDER — LORAZEPAM 2 MG/ML IJ SOLN: 0.0000 mg | Freq: Two times a day (BID) | INTRAMUSCULAR | Status: DC

## 2017-08-27 MED ORDER — FENTANYL CITRATE (PF) 100 MCG/2ML IJ SOLN: 50.0000 ug | Freq: Once | INTRAMUSCULAR | Status: DC

## 2017-08-27 NOTE — ED Triage Notes (Signed)
Patient went to Urgent care because she states that she drank vodka and fell, she states "apparently I have a knot on the back of my head and my neck hurts"  Patient arrived via EMS from urgent care. Patient is deaf, she reads lips,  CBG was 26, EMS gave 30 oral glucose she came up to 80.

## 2017-08-27 NOTE — ED Provider Notes (Signed)
[facility] EMERGENCY DEPARTMENT Provider Note   CSN: [id number] Arrival date & time: 08/27/17  1742     History   Chief Complaint No chief complaint on file.   HPI [name] is a 57 y.o. female.  HPI   57 year old female with history of alcohol abuse, anemia, CAD, fibromyalgia, deaf, depression sent here to the ER from PCP office for recent head injury and hypoglycemia.  Patient admits that she abuse alcohol and is currently trying to quit.  Last night she reportedly drank quite a bit of vodka and had a syncopal episode that she cannot recall.  She did report waking up this morning with pain in the back of her head and feeling very shaky.  She endorsed nausea without vomiting.  She also endorsed having sharp pain in her neck but most of the pain is the back of her head.  She was seen by her PCP this morning.  It was noted that her blood sugar was low and they were having difficulty starting IV to give her medication.  She was sent here for further management.  As of alcohol, patient denies any other drug use.  She reports feeling very shaky at this time.  She cannot recall the incident.  No complaint of chest pain, trouble breathing or abdominal pain.  Denies any focal numbness or weakness.  She is currently not on any blood thinner medication.  Past Medical History:  Diagnosis Date  . Accidental drug overdose 10/26/2015  . Acute respiratory failure with hypoxia ([place]) 10/23/2015  . Allergy    SEASONAL  . Anemia   . Anemia, iron deficiency 03/29/2014  . Aortic atherosclerosis ([place])   . Arthritis    "hips, shoulders, hands" (11/17/2016)  . ASD (atrial septal defect)    a. s/p ASD repair in 1973  . Aspiration pneumonia of right lower lobe due to gastric secretions ([place])   . Bronchitis 09/20/2013  . CAD (coronary artery disease)    a. 12/2015: NSTEMI occurring after cardiac arrest secondary to aspiration PNA. Echo w/ EF of 55-60%, no WMA. Outpt ischemic eval  needed.  . Cardiac arrest ([place]) 12/22/2015  . Cardiomyopathy   . Chronic bronchitis ([place])   . Chronic lower back pain   . Deaf    "not totally deaf; read lips and can hear some" (11/17/2016)  . Depression   . Diverticulosis   . Dysphagia    due to esophageal infections.  . Fibromyalgia   . Gastritis   . GERD (gastroesophageal reflux disease)   . Hearing impairment   . Hepatic steatosis   . High cholesterol   . History of hiatal hernia   . History of kidney stones   . Hypertension    [name], take htn medication to regulate heart beat.  . Increased anion gap metabolic acidosis 3/58/2518  . Lactic acidosis 12/17/2015  . Major depressive disorder, recurrent episode, moderate ([place]) 09/18/2014  . Meningitis spinal 1967  . Migraine    "all through my life" (11/17/2016)  . Nausea & vomiting 05/20/2014  . Neurocardiogenic syncope 07/19/2016  . Osteoporosis   . Pancytopenia ([place])   . PMB (postmenopausal bleeding) 02/26/2013  . Pneumonia    "several times" (11/17/2016)  . Pressure ulcer 12/18/2015  . 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 Pulmonary embolism ([place]) 2003   occured post c-section of her daughter  .  Shock ([place]) 10/19/2015  . Sleep apnea   . Starvation ketoacidosis 12/17/2015  . Vitamin D deficiency     Patient Active Problem List   Diagnosis Date Noted  . Chronic pancreatitis due to acute alcohol intoxication ([place]) 11/17/2016  . Candida infection, esophageal ([place])   . Esophageal ring, acquired   . Abdominal pain, epigastric   . Bradycardia   . Hypokalemia   . NSTEMI (non-ST elevated myocardial infarction) ([place]) 12/19/2015  . Typical atrial flutter ([place])   . Protein-calorie malnutrition, severe 12/18/2015  . Alcohol abuse 12/17/2015  . Essential hypertension 12/06/2015  . Non-traumatic compression fracture of vertebral column with routine healing 07/17/2014  .  Osteoporosis 07/17/2014  . Odynophagia 07/09/2014  . Esophageal dysphagia 07/08/2014  . Chest pain 05/20/2014  . 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 Hypercholesterolemia without hypertriglyceridemia 03/29/2014  . Fatigue 03/29/2014  . Classical migraine with intractable migraine 03/29/2014  . Disorder of mitral valve 03/29/2014  . Atypical migraine 03/29/2014  . Atrial septal defect of fossa ovalis 03/29/2014  . Arthralgia of multiple joints 03/29/2014  . Fibrositis 03/29/2014  . Abdominal pain 12/20/2013  . Absolute anemia 09/20/2013  . Family history of breast cancer 07/13/2013  . Palpitation 07/02/2013  . History of mitral valve repair 07/02/2013  . Status post patch closure of ASD 07/02/2013  . Hyperlipidemia 07/02/2013  . OAB (overactive bladder) 06/13/2013  . Ovarian cyst, left 03/15/2013  . Vaginal lesion 03/15/2013  . Ovarian mass 03/15/2013  . Maternal DVT (deep vein thrombosis), history of 02/26/2013  . Alcohol dependence ([place]) 11/07/2012  . Unspecified vitamin D deficiency 08/17/2012  . L1 vertebral fracture ([place]) 08/01/2012  . Spinal compression fracture ([place]) 07/12/2012  . Menopausal state 07/12/2012  . Psoriasis 07/12/2012  . Severe episode of recurrent major depressive disorder ([place]) 12/07/2011  . Cardiomyopathy 03/16/2011  . Hearing impairment   . Arthritis     Past Surgical History:  Procedure Laterality Date  . APPENDECTOMY    . ATRIAL SEPTAL DEFECT(ASD) CLOSURE  1973  . [place]  . CESAREAN SECTION  2003  . COCHLEAR IMPLANT Right   . ESOPHAGOGASTRODUODENOSCOPY (EGD) WITH PROPOFOL N/A 07/20/2016   Procedure: ESOPHAGOGASTRODUODENOSCOPY (EGD) WITH PROPOFOL;  Surgeon: Gatha Mayer, MD;  Location: [facility];  Service: Endoscopy;  Laterality: N/A;  . EYE MUSCLE SURGERY Bilateral   . INCISION AND DRAINAGE OF WOUND  1993   S/P c-section  . LAPAROSCOPIC CHOLECYSTECTOMY  2012  . MITRAL VALVE  REPAIR  05/2005  . TONSILLECTOMY AND ADENOIDECTOMY    . TUBAL LIGATION  1993  . TYMPANOSTOMY TUBE PLACEMENT Bilateral "lots"     OB History    Gravida  2   Para  2   Term  2   Preterm  0   AB  0   Living        SAB  0   TAB  0   Ectopic  0   Multiple      Live Births               Home Medications    Prior to Admission medications   Medication Sig Start Date End Date Taking? Authorizing Provider  acetaminophen (TYLENOL) 325 MG tablet Take 2 tablets (650 mg total) by mouth every 6 (six) hours as needed for mild pain (or Fever >/= 101). 11/20/16   [name], MD  Ascorbic Acid (VITAMIN C) 1000 MG tablet Take 1,000 mg by mouth daily.  [provider]  aspirin 81 MG chewable tablet Chew 81 mg by mouth daily.    [provider]  atorvastatin (LIPITOR) 40 MG tablet Take 1 tablet (40 mg total) by mouth daily at 6 PM. 03/09/17   [name], MD  Cholecalciferol (VITAMIN D3) 2000 units TABS Take 4,000 Units by mouth daily.     [provider]  fexofenadine (ALLEGRA ALLERGY) 180 MG tablet Take 180 mg by mouth daily.    [provider]  fluticasone (FLONASE) 50 MCG/ACT nasal spray Place 2 sprays into both nostrils daily.    [provider]  lipase/protease/amylase (CREON) 36000 UNITS CPEP capsule Take 1 capsule (36,000 Units total) by mouth 3 (three) times daily before meals. Patient not taking: Reported on 08/27/2017 02/16/17   [name], MD  Metoclopramide HCl 5 MG TBDP Take 1 tablet (5 mg total) by mouth 4 (four) times daily -  before meals and at bedtime. 02/16/17   [name], MD  metoprolol tartrate (LOPRESSOR) 25 MG tablet Take 1 tablet (25 mg total) by mouth 2 (two) times daily. 03/09/17   [name], MD  mirabegron ER (MYRBETRIQ) 50 MG TB24 tablet Take 1 tablet (50 mg total) by mouth daily. 04/30/17   [name], MD  nitroGLYCERIN (NITROSTAT) 0.4 MG SL tablet Place 1 tablet (0.4 mg total) under the tongue every  5 (five) minutes as needed for chest pain. 05/11/16   [name], MD  Omega-3 Fatty Acids (FISH OIL) 1000 MG CAPS Take 1,000 mg by mouth daily.     [provider]  omeprazole (PRILOSEC) 20 MG capsule Take 1 capsule (20 mg total) by mouth daily. Patient not taking: Reported on 08/27/2017 08/16/17   Palumbo, April, MD  omeprazole (PRILOSEC) 40 MG capsule Take 40 mg by mouth daily.    [provider]  prazosin (MINIPRESS) 2 MG capsule Take 2 mg by mouth at bedtime.  02/17/16   [provider]  Prenatal Multivit-Min-Fe-FA (PRENATAL VITAMINS PO) Take 1 tablet by mouth daily.    [provider]  PRESCRIPTION MEDICATION Inhale into the lungs at bedtime. CPAP    [provider]  Vilazodone HCl (VIIBRYD) 40 MG TABS Take 40 mg by mouth daily.     [provider]  vitamin B-12 (CYANOCOBALAMIN) 500 MCG tablet Take 500 mcg by mouth daily.    [provider]    Family History Family History  Problem Relation Age of Onset  . Breast cancer Mother        bilateral; ages 80 and 21; TAH/BSO ~50  . Depression Sister   . Heart disease Father   . Hypertension Father   . Heart attack Father   . Colon cancer Neg Hx   . Esophageal cancer Neg Hx   . Pancreatic cancer Neg Hx   . Rectal cancer Neg Hx   . Stomach cancer Neg Hx     Social History Social History   Tobacco Use  . Smoking status: Never Smoker  . Smokeless tobacco: Never Used  Substance Use Topics  . Alcohol use: Yes    Alcohol/week: 0.0 oz    Comment: Wine tonight with dinner .  [name] Drug use: Yes    Types: Marijuana    Comment: "tried marijuana a few times in college"     Allergies   Depakote [divalproex sodium]; Diazepam; Valium; and Tizanidine hcl   Review of Systems Review of Systems  All other systems reviewed and  are negative.    Physical Exam Updated Vital Signs There were no vitals taken for this visit.  Physical Exam  Constitutional: She is oriented to  person, place, and time. She appears well-developed and well-nourished.  Patient appears very tremulous but mentating appropriately.  Hard of hearing  HENT:  Scalp: Ecchymosis to posterior scalp towards the right occiput with tenderness to palpation but no deep laceration. Small ecchymosis noted to left inferior chin mildly tender to palpation.  No hemotympanum, no septal hematoma, no malocclusion and no midface tenderness  Eyes: Conjunctivae are normal.  Neck: Neck supple.  Tenderness along cervical spine without crepitus or step-off  Cardiovascular:  Mild tachycardia without murmur rubs gallops  Pulmonary/Chest: Effort normal and breath sounds normal.  Abdominal: Soft. Bowel sounds are normal. She exhibits no distension. There is no tenderness.  Neurological: She is alert and oriented to person, place, and time. No cranial nerve deficit or sensory deficit. GCS eye subscore is 4. GCS verbal subscore is 5. GCS motor subscore is 6.  Able to move all 4 extremities but very tremulous  Skin: No rash noted.  Psychiatric: She has a normal mood and affect.  Nursing note and vitals reviewed.    ED Treatments / Results  Labs (all labs ordered are listed, but only abnormal results are displayed) Labs Reviewed - No data to display  EKG None  Radiology Dg Cervical Spine 2 Or 3 Views  Result Date: 08/27/2017 CLINICAL DATA:  Right-sided neck pain following fall yesterday EXAM: CERVICAL SPINE - 3 VIEW COMPARISON:  None. FINDINGS: Seven cervical segments are well visualized. Osteophytic changes are noted from C4-C7. Facet hypertrophic changes are seen. Mild degenerative anterolisthesis of C3 on C4 is noted. No soft tissue changes are seen. The odontoid is within normal limits. Visualized upper chest is unremarkable. IMPRESSION: Multilevel degenerative change without acute abnormality. Electronically Signed   By: Inez Catalina M.D.   On: 08/27/2017 16:10    Procedures Procedures (including critical  care time)  Medications Ordered in ED Medications - No data to display   Initial Impression / Assessment and Plan / ED Course  I have reviewed the triage vital signs and the nursing notes.  Pertinent labs & imaging results that were available during my care of the patient were reviewed by me and considered in my medical decision making (see chart for details).     BP 117/61   Pulse (!) 104   Resp 17   SpO2 100%    Final Clinical Impressions(s) / ED Diagnoses   Final diagnoses:  Scalp hematoma, initial encounter  Alcohol withdrawal syndrome with complication ([place])  Hypoglycemia    ED Discharge Orders    None     6:19 PM Patient abused alcohol and passed out yesterday after heavy alcohol use.  Does have a hematoma to the back of her head with associated pain.  Will obtain head and cervical spine CT for further evaluation.  She was found to be hypoglycemic with a CBG of 50.  She did not eat her dinner last night.  She denies history of diabetes.  Workup initiated.  Patient placed in CIWA  Protocol due to exhibiting withdrawal sxs.  IVF given.  Care discussed with Dr. Tomi Bamberger.  10:52 PM CBG improved after an amp of D50. Patient is still tremulous likely from alcohol withdrawal, will continue with CIWA protocol.  Head and cervical spine CT without acute fractures or intracranial involvement.  Evidence of right parietal occipital scalp hematoma were noted.  Cervical soft collar provided for support.  Appreciate consultation from medicine who agrees to admit patient for obs.    Domenic Moras, PA-C 08/27/17 2329    Dorie Rank, MD 08/31/17 [phone]

## 2017-08-27 NOTE — ED Notes (Signed)
Patient transported to CT 

## 2017-08-27 NOTE — ED Notes (Signed)
Pt attempted to urinate on bedside commode but was unable to provide sample

## 2017-08-27 NOTE — Progress Notes (Signed)
[name]  MRN: [id number] DOB: 21-Aug-1960  PCP: [name], MD  Chief Complaint  Patient presents with  . Fall    pt stated that she "fell" sometime last night, " I don't have no memory of it". 08/26/17  . Bruise    under left side of chin, left forearm  . knot    right side of the back of head  . Neck pain  . shoulder pain    Subjective:  Pt presents to clinic for knot of the back of her head and bruise on her left chin.  She went to bed last night and then this am woke up in her bed.  She is shaky today.  She has had a banana today to eat.  She had an unknown amount of straight vodka last night and no dinner.   No headache, no dizzy, no nausea, she is hungry and thirsty today.  Her head hurts where she has a knot of her head.  She is very shaky - it has not changed much throughout the day.  Her mom drove her here today.  She woke up at 7am to get to the phone then daughter came to her house.  She drank ETOH - she thinks she was drunk but she does not remember it.  She does not drink every day - but she does know she has a problem with ETOH.  She is not confused or disoriented.  She has no palpitations, or chest pain today or last night.  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  History is obtained by patient.  Review of Systems  Constitutional: Negative for chills and fever.       Pale, shaking in room - easy conversation and recall of known events is good - follows commands  Respiratory: Negative for cough and shortness of breath.   Neurological: Negative for dizziness, speech difficulty, weakness, light-headedness, numbness and headaches.    Patient Active Problem List   Diagnosis Date Noted  . Fall at home, initial encounter 08/27/2017  . Fall 08/27/2017  . Chronic pancreatitis due to acute alcohol intoxication ([place]) 11/17/2016  . Candida infection, esophageal ([place])   . Esophageal ring, acquired   . Abdominal pain, epigastric   . Bradycardia   . Hypokalemia    . NSTEMI (non-ST elevated myocardial infarction) ([place]) 12/19/2015  . Typical atrial flutter ([place])   . Protein-calorie malnutrition, severe 12/18/2015  . Alcohol abuse 12/17/2015  . Essential hypertension 12/06/2015  . Non-traumatic compression fracture of vertebral column with routine healing 07/17/2014  . Osteoporosis 07/17/2014  . Odynophagia 07/09/2014  . Esophageal dysphagia 07/08/2014  . Chest pain 05/20/2014  . 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 Hypercholesterolemia without hypertriglyceridemia 03/29/2014  . Fatigue 03/29/2014  . Classical migraine with intractable migraine 03/29/2014  . Disorder of mitral valve 03/29/2014  . Atypical migraine 03/29/2014  . Atrial septal defect of fossa ovalis 03/29/2014  . Arthralgia of multiple joints 03/29/2014  . Fibrositis 03/29/2014  . Abdominal pain 12/20/2013  . Absolute anemia 09/20/2013  . Family history of breast cancer 07/13/2013  . Palpitation 07/02/2013  . History of mitral valve repair 07/02/2013  . Status post patch closure of ASD 07/02/2013  . Hyperlipidemia 07/02/2013  . OAB (overactive bladder) 06/13/2013  . Ovarian cyst, left 03/15/2013  . Vaginal lesion 03/15/2013  . Ovarian mass 03/15/2013  . Maternal DVT (deep vein thrombosis), history of 02/26/2013  . Alcohol dependence with uncomplicated withdrawal (  [place]) 11/07/2012  . Unspecified vitamin D deficiency 08/17/2012  . L1 vertebral fracture ([place]) 08/01/2012  . Spinal compression fracture ([place]) 07/12/2012  . Menopausal state 07/12/2012  . Psoriasis 07/12/2012  . Severe episode of recurrent major depressive disorder ([place]) 12/07/2011  . Cardiomyopathy 03/16/2011  . Hearing impairment   . Arthritis     Current Outpatient Medications on File Prior to Visit  Medication Sig Dispense Refill  . acetaminophen (TYLENOL) 325 MG tablet Take 2 tablets (650 mg total) by mouth every 6 (six) hours as needed for mild pain (or Fever >/=  101).    . Ascorbic Acid (VITAMIN C) 1000 MG tablet Take 1,000 mg by mouth daily.    [name] aspirin 81 MG chewable tablet Chew 81 mg by mouth daily.    [name] atorvastatin (LIPITOR) 40 MG tablet Take 1 tablet (40 mg total) by mouth daily at 6 PM. 90 tablet 1  . Cholecalciferol (VITAMIN D3) 2000 units TABS Take 4,000 Units by mouth daily.     . fexofenadine (ALLEGRA ALLERGY) 180 MG tablet Take 180 mg by mouth daily.    . fluticasone (FLONASE) 50 MCG/ACT nasal spray Place 2 sprays into both nostrils daily.    . metoprolol tartrate (LOPRESSOR) 25 MG tablet Take 1 tablet (25 mg total) by mouth 2 (two) times daily. 180 tablet 3  . mirabegron ER (MYRBETRIQ) 50 MG TB24 tablet Take 1 tablet (50 mg total) by mouth daily. 90 tablet 1  . nitroGLYCERIN (NITROSTAT) 0.4 MG SL tablet Place 1 tablet (0.4 mg total) under the tongue every 5 (five) minutes as needed for chest pain. 30 tablet 0  . Omega-3 Fatty Acids (FISH OIL) 1000 MG CAPS Take 1,000 mg by mouth daily.     . prazosin (MINIPRESS) 2 MG capsule Take 2 mg by mouth at bedtime.     . Prenatal Multivit-Min-Fe-FA (PRENATAL VITAMINS PO) Take 1 tablet by mouth daily.    [name] PRESCRIPTION MEDICATION Inhale into the lungs at bedtime. CPAP    . Vilazodone HCl (VIIBRYD) 40 MG TABS Take 40 mg by mouth daily.     . vitamin B-12 (CYANOCOBALAMIN) 500 MCG tablet Take 500 mcg by mouth daily.    . lipase/protease/amylase (CREON) 36000 UNITS CPEP capsule Take 1 capsule (36,000 Units total) by mouth 3 (three) times daily before meals. (Patient not taking: Reported on 08/27/2017) 90 capsule 2  . omeprazole (PRILOSEC) 20 MG capsule Take 1 capsule (20 mg total) by mouth daily. (Patient taking differently: Take 40 mg by mouth daily. ) 30 capsule 0   No current facility-administered medications on file prior to visit.     Allergies  Allergen Reactions  . Depakote [Divalproex Sodium] Anaphylaxis  . Diazepam Other (See Comments)    Died on operating table. TOLERATES ATIVAN AND LIBRIUM  W/O DIFFICULTY  . Valium Anaphylaxis    Tolerates Ativan and Librium w/o complication  . Tizanidine Hcl Other (See Comments)    Went into shock with hypotension, AMS, AKI, asp pna after unintentional OD - see hosp 6/11-19/2017.    Past Medical History:  Diagnosis Date  . Accidental drug overdose 10/26/2015  . Acute respiratory failure with hypoxia ([place]) 10/23/2015  . Allergy    SEASONAL  . Anemia   . Anemia, iron deficiency 03/29/2014  . Aortic atherosclerosis ([place])   . Arthritis    "hips, shoulders, hands" (11/17/2016)  . ASD (atrial septal defect)    a. s/p ASD repair in 1973  . Aspiration pneumonia of right lower  lobe due to gastric secretions ([place])   . Bronchitis 09/20/2013  . CAD (coronary artery disease)    a. 12/2015: NSTEMI occurring after cardiac arrest secondary to aspiration PNA. Echo w/ EF of 55-60%, no WMA. Outpt ischemic eval needed.  . Cardiac arrest ([place]) 12/22/2015  . Cardiomyopathy   . Chronic bronchitis ([place])   . Chronic lower back pain   . Deaf    "not totally deaf; read lips and can hear some" (11/17/2016)  . Depression   . Diverticulosis   . Dysphagia    due to esophageal infections.  . Fibromyalgia   . Gastritis   . GERD (gastroesophageal reflux disease)   . Hearing impairment   . Hepatic steatosis   . High cholesterol   . History of hiatal hernia   . History of kidney stones   . Hypertension    [name], take htn medication to regulate heart beat.  . Increased anion gap metabolic acidosis 5/46/2703  . Lactic acidosis 12/17/2015  . Major depressive disorder, recurrent episode, moderate ([place]) 09/18/2014  . Meningitis spinal 1967  . Migraine    "all through my life" (11/17/2016)  . Nausea & vomiting 05/20/2014  . Neurocardiogenic syncope 07/19/2016  . Osteoporosis   . Pancytopenia ([place])   . PMB (postmenopausal bleeding) 02/26/2013  . Pneumonia    "several times" (11/17/2016)  . Pressure ulcer 12/18/2015  . PTSD (post-traumatic stress disorder) 12/2015    "S/P esophagus was paralyzed and I choked to death; they did code blue; brought me back" (11/17/2016)  . Pulmonary embolism ([place]) 2003   occured post c-section of her daughter  . Shock ([place]) 10/19/2015  . Sleep apnea   . Starvation ketoacidosis 12/17/2015  . Vitamin D deficiency    Social History   Social History Narrative   Lives with 44 year old daughter.       Social History   Tobacco Use  . Smoking status: Never Smoker  . Smokeless tobacco: Never Used  Substance Use Topics  . Alcohol use: Yes    Alcohol/week: 0.0 oz    Comment: Wine tonight with dinner .  [name] Drug use: Yes    Types: Marijuana    Comment: "tried marijuana a few times in college"   family history includes Breast cancer in her mother; Depression in her sister; Heart attack in her father; Heart disease in her father; Hypertension in her father.     Objective:  BP 129/78   Pulse 98   Temp 98.9 F (37.2 C) (Oral)   Resp 16   Ht '5\' 4"'  (1.626 m)   Wt 122 lb 3.2 oz (55.4 kg)   SpO2 95%   BMI 20.98 kg/m  Body mass index is 20.98 kg/m.  Physical Exam  Constitutional: She appears well-developed. She is easily aroused. She appears distressed (shaking - laying down wn- seems upset).  HENT:  Head:    Eyes: Pupils are equal, round, and reactive to light. Conjunctivae, EOM and lids are normal. Lids are everted and swept, no foreign bodies found.  Neck: Full passive range of motion without pain. Muscular tenderness (more on the right side - muscle spasm) present. No spinous process tenderness present. Normal range of motion present.  Neurological: She is easily aroused.   C spine xray IMPRESSION: Multilevel degenerative change without acute abnormality.  Results for orders placed or performed in visit on 08/27/17  POCT glucose (manual entry)  Result Value Ref Range   POC Glucose 19 (A) 70 - 99 mg/dl  Pt was given glucose 30g - glucose increased to 32 - IV was attempted 3 times in office but was unsuccessful  - [facility] via EMS  Once pt's glucose was determine pt was not left alone - I was directly with the patient for 25 mins while attempting IVs and waiting for EMS In addition to the time spent getting the history and doing her PE    Assessment and Plan :  Fall, initial encounter - Plan: DG Cervical Spine 2 or 3 views - likely due to ETOH intoxication  Hypoglycemia - tried to increase in office but was unsuccessful in IV attempts - did get it to slightly increase with oral glucose - pt was conversant the entire time without confusion  Intermittent memory loss - likely due to ETOH intoxication  Shaking - Plan: POCT glucose (manual entry) - could be due to hypogycemia vs tremors from withdraw  Alcohol abuse - Plan: Ethanol, CMP14+EGFR  \All labs were canceled because pt went to ED for further evaluation and treatment   Windell Hummingbird PA-C  [facility] 09/01/2017 12:22 PM

## 2017-08-27 NOTE — H&P (Addendum)
History and Physical  [name] SNK:[id number] DOB: 01/26/1961 DOA: 08/27/2017 1742    PCP: [name], MD     Chief Complaint: unwitnessed fall and alcohol withdrawal  HPI: [name] is a 57 y.o. female with hearing impairment, alcohol dependence, chronic pancreatitis with remote hx of withdrawal seizure who presents after unwitnessed fall from home. She does not recall falling but thinks she may have fallen while intoxicated in the middle of the night while going to the bathroom and crawled her way back into bed. Normally drinks at least 1-1.5 pints of vodka a day and last drink was sometime last evening between 8pm and 12 am. Daughter (who is 23 yo) sometimes stays with her. During interview, reports feeling thirsty, tremulous and exquisite tenderness over her occiput and posterior shoulders bilaterally. Denies urinary or bowel incontinence or evidence thereof at the home. She reports that she had been drinking unknown quantity of vodka last evening and then in the morning was unable to get out of bed due to severe pain in the back of her head and generalized weakness -- she had been in bed all day until this afternoon when she was able to reach her PCP clinic. Point of care glucose found to be 19 mg/dl at clinic. Patient sent to ED from PCP office for further monitoring and correction of hypoglycemia.   Of note, patient was recently seen at [facility] ED for EtOH withdrawal sx in 08/15/2017 last week. She had reported withdrawal seizure several years ago but per chart review in the past year has not had documented seizure. Has not been compliant with most of her medications. She also reports being hearing impaired but able to "read lips" and declined sign language interpreter during this interview. She has not been taking her home medicines consistently.     ED course:  VS: HR 88, BP 133/89 SO2 96%  - CT head: no hemorrhage or acute abnormalities; scalp hematoma - received 1L NS and thiamine  100mg  IV  Review of Systems:  + fall, loss of consciousness (unwitnessed), tremulousness + chronic hearing impairement + nausea - no cough - no chest pain, dyspnea on exertion - no edema, PND, orthopnea - no vomiting; no tarry, melanotic or bloody stools - no dysuria, increased urinary frequency - no weight changes  Rest of systems reviewed are negative, except as per above history.   Past Medical History:  Diagnosis Date  . Accidental drug overdose 10/26/2015  . Acute respiratory failure with hypoxia ([place]) 10/23/2015  . Allergy    SEASONAL  . Anemia   . Anemia, iron deficiency 03/29/2014  . Aortic atherosclerosis ([place])   . Arthritis    "hips, shoulders, hands" (11/17/2016)  . ASD (atrial septal defect)    a. s/p ASD repair in 1973  . Aspiration pneumonia of right lower lobe due to gastric secretions ([place])   . Bronchitis 09/20/2013  . CAD (coronary artery disease)    a. 12/2015: NSTEMI occurring after cardiac arrest secondary to aspiration PNA. Echo w/ EF of 55-60%, no WMA. Outpt ischemic eval needed.  . Cardiac arrest ([place]) 12/22/2015  . Cardiomyopathy   . Chronic bronchitis ([place])   . Chronic lower back pain   . Deaf    "not totally deaf; read lips and can hear some" (11/17/2016)  . Depression   . Diverticulosis   . Dysphagia    due to esophageal infections.  . Fibromyalgia   . Gastritis   . GERD (gastroesophageal  reflux disease)   . Hearing impairment   . Hepatic steatosis   . High cholesterol   . History of hiatal hernia   . History of kidney stones   . Hypertension    [name], take htn medication to regulate heart beat.  . Increased anion gap metabolic acidosis 1/70/0174  . Lactic acidosis 12/17/2015  . Major depressive disorder, recurrent episode, moderate ([place]) 09/18/2014  . Meningitis spinal 1967  . Migraine    "all through my life" (11/17/2016)  . Nausea & vomiting 05/20/2014  . Neurocardiogenic syncope 07/19/2016  . Osteoporosis   . Pancytopenia ([place])   . PMB  (postmenopausal bleeding) 02/26/2013  . Pneumonia    "several times" (11/17/2016)  . Pressure ulcer 12/18/2015  . 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 Pulmonary embolism ([place]) 2003   occured post c-section of her daughter  . Shock ([place]) 10/19/2015  . Sleep apnea   . Starvation ketoacidosis 12/17/2015  . Vitamin D deficiency    Past Surgical History:  Procedure Laterality Date  . APPENDECTOMY    . ATRIAL SEPTAL DEFECT(ASD) CLOSURE  1973  . [place]  . CESAREAN SECTION  2003  . COCHLEAR IMPLANT Right   . ESOPHAGOGASTRODUODENOSCOPY (EGD) WITH PROPOFOL N/A 07/20/2016   Procedure: ESOPHAGOGASTRODUODENOSCOPY (EGD) WITH PROPOFOL;  Surgeon: Gatha Mayer, MD;  Location: [place];  Service: Endoscopy;  Laterality: N/A;  . EYE MUSCLE SURGERY Bilateral   . INCISION AND DRAINAGE OF WOUND  1993   S/P c-section  . LAPAROSCOPIC CHOLECYSTECTOMY  2012  . MITRAL VALVE REPAIR  05/2005  . TONSILLECTOMY AND ADENOIDECTOMY    . TUBAL LIGATION  1993  . TYMPANOSTOMY TUBE PLACEMENT Bilateral "lots"   Social History:  reports that she has never smoked. She has never used smokeless tobacco. She reports that she drinks alcohol. She reports that she has current or past drug history. Drug: Marijuana.  Allergies  Allergen Reactions  . Depakote [Divalproex Sodium] Anaphylaxis  . Diazepam Other (See Comments)    Died on operating table. TOLERATES ATIVAN AND LIBRIUM W/O DIFFICULTY  . Valium Anaphylaxis    Tolerates Ativan and Librium w/o complication  . Tizanidine Hcl Other (See Comments)    Went into shock with hypotension, AMS, AKI, asp pna after unintentional OD - see hosp 6/11-19/2017.    Family History  Problem Relation Age of Onset  . Breast cancer Mother        bilateral; ages 20 and 56; TAH/BSO ~50  . Depression Sister   . Heart disease Father   . Hypertension  Father   . Heart attack Father   . Colon cancer Neg Hx   . Esophageal cancer Neg Hx   . Pancreatic cancer Neg Hx   . Rectal cancer Neg Hx   . Stomach cancer Neg Hx      Prior to Admission medications   Medication Sig Start Date End Date Taking? Authorizing Provider  acetaminophen (TYLENOL) 325 MG tablet Take 2 tablets (650 mg total) by mouth every 6 (six) hours as needed for mild pain (or Fever >/= 101). 11/20/16  Yes Buriev, Arie Sabina, MD  Ascorbic Acid (VITAMIN C) 1000 MG tablet Take 1,000 mg by mouth daily.   Yes [provider]  aspirin 81 MG chewable tablet Chew 81 mg by mouth daily.   Yes [provider]  atorvastatin (LIPITOR) 40 MG tablet  Take 1 tablet (40 mg total) by mouth daily at 6 PM. 03/09/17  Yes [name], MD  Cholecalciferol (VITAMIN D3) 2000 units TABS Take 4,000 Units by mouth daily.    Yes [provider]  fexofenadine (ALLEGRA ALLERGY) 180 MG tablet Take 180 mg by mouth daily.   Yes [provider]  fluticasone (FLONASE) 50 MCG/ACT nasal spray Place 2 sprays into both nostrils daily.   Yes [provider]  metoprolol tartrate (LOPRESSOR) 25 MG tablet Take 1 tablet (25 mg total) by mouth 2 (two) times daily. 03/09/17  Yes [name], MD  mirabegron ER (MYRBETRIQ) 50 MG TB24 tablet Take 1 tablet (50 mg total) by mouth daily. 04/30/17  Yes [name], MD  nitroGLYCERIN (NITROSTAT) 0.4 MG SL tablet Place 1 tablet (0.4 mg total) under the tongue every 5 (five) minutes as needed for chest pain. 05/11/16  Yes [name], MD  Omega-3 Fatty Acids (FISH OIL) 1000 MG CAPS Take 1,000 mg by mouth daily.    Yes [provider]  omeprazole (PRILOSEC) 20 MG capsule Take 1 capsule (20 mg total) by mouth daily. Patient taking differently: Take 40 mg by mouth daily.  08/16/17  Yes Palumbo, April, MD  prazosin (MINIPRESS) 2 MG capsule Take 2 mg by mouth at bedtime.  02/17/16  Yes [provider]  Prenatal Multivit-Min-Fe-FA  (PRENATAL VITAMINS PO) Take 1 tablet by mouth daily.   Yes [provider]  PRESCRIPTION MEDICATION Inhale into the lungs at bedtime. CPAP   Yes [provider]  Vilazodone HCl (VIIBRYD) 40 MG TABS Take 40 mg by mouth daily.    Yes [provider]  vitamin B-12 (CYANOCOBALAMIN) 500 MCG tablet Take 500 mcg by mouth daily.   Yes [provider]  lipase/protease/amylase (CREON) 36000 UNITS CPEP capsule Take 1 capsule (36,000 Units total) by mouth 3 (three) times daily before meals. Patient not taking: Reported on 08/27/2017 02/16/17   [name], MD  Metoclopramide HCl 5 MG TBDP Take 1 tablet (5 mg total) by mouth 4 (four) times daily -  before meals and at bedtime. Patient not taking: Reported on 08/27/2017 02/16/17   [name], MD    Temp:  [98.9 F (37.2 C)] 98.9 F (37.2 C) (04/20 1527) Pulse Rate:  [88-111] 104 (04/20 2100) Resp:  [16-17] 17 (04/20 2100) BP: (117-133)/(56-89) 117/61 (04/20 2100) SpO2:  [95 %-100 %] 100 % (04/20 2100) Weight:  [55.4 kg (122 lb 3.2 oz)] 55.4 kg (122 lb 3.2 oz) (04/20 1527)  PHYSICAL EXAM:  BP 117/61   Pulse (!) 104   Resp 17   SpO2 100%   GEN thin middle-aged caucasian female; wearing glasses. Patient able to read lips, reports hearing impairment  HEENT NCAT EOM PERRL; clear oropharynx, no cervical LAD; moist mucus membranes  JVP estimated 4-5 cm H2O above RA; no HJR ; no carotid bruits b/l ;  CV regular tachycardic; normal S1 and S2; no m/r/g or S3/S4; PMI non displaced; no parasternal heave  RESP CTA b/l; breathing unlabored and symmetric  ABD soft NT ND +normoactive BS  EXT warm throughout b/l; no peripheral edema b/l  PULSES  DP and radials 2+ intact SKIN/MSK  Small left chin bruise and palpable 3x3 oval hematoma in posterior occiptal region  NEURO/PSYCH no focal deficits   LABS ON ADMISSION:  Basic Metabolic Panel: Recent Labs  Lab 08/27/17 1922  NA 136  K 4.4  CL 101  CO2 19*  GLUCOSE 186*  BUN  18  CREATININE 0.71  CALCIUM 7.7*   CBC: Recent Labs  Lab 08/27/17 1922  WBC 4.2  NEUTROABS 3.3  HGB 10.8*  HCT 31.8*  MCV 93.0  PLT 278   Liver Function Tests: No results for input(s): AST, ALT, ALKPHOS, BILITOT, PROT, ALBUMIN in the last 168 hours. No results for input(s): LIPASE, AMYLASE in the last 168 hours. No results for input(s): AMMONIA in the last 168 hours. Coagulation:  Lab Results  Component Value Date   INR 1.1 (H) 03/15/2017   INR 0.97 07/21/2016   INR 1.25 12/19/2015   No results found for: PTT Lactic Acid, Venous:     Component Value Date/Time   LATICACIDVEN 1.6 11/17/2016 1738   Cardiac Enzymes: No results for input(s): CKTOTAL, CKMB, CKMBINDEX, TROPONINI in the last 168 hours.  BNP (last 3 results) No results for input(s): PROBNP in the last 8760 hours. CBG: Recent Labs  Lab 08/27/17 1744 08/27/17 1804 08/27/17 1921  GLUCAP 57* 50* 169*    Radiological Exams on Admission: Dg Cervical Spine 2 Or 3 Views  Result Date: 08/27/2017 CLINICAL DATA:  Right-sided neck pain following fall yesterday EXAM: CERVICAL SPINE - 3 VIEW COMPARISON:  None. FINDINGS: Seven cervical segments are well visualized. Osteophytic changes are noted from C4-C7. Facet hypertrophic changes are seen. Mild degenerative anterolisthesis of C3 on C4 is noted. No soft tissue changes are seen. The odontoid is within normal limits. Visualized upper chest is unremarkable. IMPRESSION: Multilevel degenerative change without acute abnormality. Electronically Signed   By: Inez Catalina M.D.   On: 08/27/2017 16:10   Ct Head Wo Contrast  Result Date: 08/27/2017 CLINICAL DATA:  [place] and hit back of head with neck pain EXAM: CT HEAD WITHOUT CONTRAST CT CERVICAL SPINE WITHOUT CONTRAST TECHNIQUE: Multidetector CT imaging of the head and cervical spine was performed following the standard protocol without intravenous contrast. Multiplanar CT image reconstructions of the cervical spine were also  generated. COMPARISON:  CT brain 07/05/2017, neck radiograph 05/10/2017 FINDINGS: CT HEAD FINDINGS Brain: No acute territorial infarction, hemorrhage or intracranial mass is visualized. Artifact from right-sided implant limits the study. Mild to moderate atrophy. Moderate small vessel ischemic changes of the white matter. Stable ventricle size. Vascular: No hyperdense vessels.  Carotid vascular calcification Skull: Right cochlear implant. Trace fluid in the left mastoids. No fracture Sinuses/Orbits: Mild mucosal thickening in the maxillary and ethmoid sinuses. No acute orbital abnormality Other: Moderate right parietooccipital scalp hematoma CT CERVICAL SPINE FINDINGS Alignment: No subluxation.  Facet alignment within normal limits. Skull base and vertebrae: Craniovertebral junction is intact. No fracture is seen. Partial posterior fusion of C2-C3. Soft tissues and spinal canal: No prevertebral fluid or swelling. No visible canal hematoma. Disc levels: Moderate to marked degenerative changes at C4-C5, C5-C6 and C6-C7 with mild degenerative changes at C2-C3 and C3-C4. Multiple level right greater than left hypertrophic facet arthropathy. Upper chest: Lung apices are clear. Other: None IMPRESSION: 1. No definite CT evidence for acute intracranial abnormality allowing for artifact arising from right cochlear implant. Atrophy and small vessel ischemic changes of the white matter 2. Moderate right parietooccipital scalp hematoma 3. Moderate to marked degenerative changes of the spine. No definite acute osseous abnormality. Electronically Signed   By: Donavan Foil M.D.   On: 08/27/2017 20:11   Ct Cervical Spine Wo Contrast  Result Date: 08/27/2017 CLINICAL DATA:  [place] and hit back of head with neck pain EXAM: CT HEAD WITHOUT CONTRAST CT CERVICAL SPINE WITHOUT  CONTRAST TECHNIQUE: Multidetector CT imaging of the head and cervical spine was performed following the standard protocol without intravenous contrast.  Multiplanar CT image reconstructions of the cervical spine were also generated. COMPARISON:  CT brain 07/05/2017, neck radiograph 05/10/2017 FINDINGS: CT HEAD FINDINGS Brain: No acute territorial infarction, hemorrhage or intracranial mass is visualized. Artifact from right-sided implant limits the study. Mild to moderate atrophy. Moderate small vessel ischemic changes of the white matter. Stable ventricle size. Vascular: No hyperdense vessels.  Carotid vascular calcification Skull: Right cochlear implant. Trace fluid in the left mastoids. No fracture Sinuses/Orbits: Mild mucosal thickening in the maxillary and ethmoid sinuses. No acute orbital abnormality Other: Moderate right parietooccipital scalp hematoma CT CERVICAL SPINE FINDINGS Alignment: No subluxation.  Facet alignment within normal limits. Skull base and vertebrae: Craniovertebral junction is intact. No fracture is seen. Partial posterior fusion of C2-C3. Soft tissues and spinal canal: No prevertebral fluid or swelling. No visible canal hematoma. Disc levels: Moderate to marked degenerative changes at C4-C5, C5-C6 and C6-C7 with mild degenerative changes at C2-C3 and C3-C4. Multiple level right greater than left hypertrophic facet arthropathy. Upper chest: Lung apices are clear. Other: None IMPRESSION: 1. No definite CT evidence for acute intracranial abnormality allowing for artifact arising from right cochlear implant. Atrophy and small vessel ischemic changes of the white matter 2. Moderate right parietooccipital scalp hematoma 3. Moderate to marked degenerative changes of the spine. No definite acute osseous abnormality. Electronically Signed   By: Donavan Foil M.D.   On: 08/27/2017 20:11    EKG: Independently reviewed. Sinus tachycardia rate 109, QTc 520  Assessment/Plan NAVEAH BRAVE is a 57 y.o. female with active alcohol dependence and hx of withdrawal who presented after unwitnessed fall while intoxicated. CT head and neck shows no  fractures or acute significant abnormalities. Does have moderate R parieto-occiptal scalp hematoma. Plan for observation with CIWA protocol, pain control, and hydration. Plan for PT/OT evaluation tomorrow as well. Patient unfortunately is pre-contemplative regarding alcohol cessation. Hypoglycemia has resolved in ED after D50 amp. Concern for seizure low since fall likely happened during intoxication in the middle of the night, although cannot rule out unwitnessed seizure event.   Principal Problem:   Fall at home, initial encounter Active Problems:   Alcohol dependence with uncomplicated withdrawal [facility])   Fall  # Fall without fracture in setting of intoxication. Sustained scalp hematoma > CT head without hemorrhage; CT neck shows chronic degenerative changes but no fracture  - pain control with spot dosing IV morphine - warm compress - ibuprofen 400mg  q6h prn (only a short course or few doses to avoid reflux issues) - check orthostatic vitals  - would avoid prescribing PO opioids on discharge given heavy alcohol use - PT/OT evalulation  # EtOH withdrawal. Last drink sometime 4/19 evening ~8-12 pm > alcohol level was 25 in ED  - CIWA with IV and PO ativan prn per protocol - continue IV fluids with NS + thiamine at 100 cc/hr  # Prolonged QTc 520s, likely chronic may be related to electrolyte derangement vs alcohol consumption itself (liver disease) > prior EKG in 07/2017 also showed prolongation QTc 530 > K on admission 4.4. Calcium 7.7.  - avoid zofran - repleting calcium 2gm and magnesium 2gm now - electrolyte repletion - monitor BMP tomorrow  # Hx of depression -- pt has not been compliant with viibryd at home - resume home viibryd 40mg  daily tomorrow  # Hx of chronic pancreatitis   > No evidence of pancreatitis  flare on this admission. Pt not taking creon at home - monitor for now  # Hypoglycemia in setting of poor nutrition, chronic alcohol abuse. Resolved with D50 amp -  regular diet - no need to monitor overnight unless pt symptomatic; will check glucose with BMP with morning labs  DVT Prophylaxis: ambulatory   Code Status:  Full Code Family Communication: none - pt prefers to speak to family (ex-husband) by phone Disposition Plan: observation overnight and PT/OT eval tomorrow  Extended Emergency Contact Information Primary Emergency Contact: Orsino,[place] Address: [address]          [place], [place] [address] [place] Phone: [phone] Relation: Son Secondary Emergency Contact: Skaggs,Jade Address: [address], [place] [address] [place] Phone: [phone] Mobile Phone: [phone] Relation: Daughter Mother: [name] Address: [address]          [place], [place] [address] [place] Phone: [phone]  Time spent: [place], MD [facility] Pager [phone]  If 7PM-7AM, please contact night-coverage [email or web address] Password [facility] 08/27/2017, 11:16 PM

## 2017-08-28 DIAGNOSIS — R69 Illness, unspecified: Secondary | ICD-10-CM | POA: Diagnosis not present

## 2017-08-28 DIAGNOSIS — S0003XA Contusion of scalp, initial encounter: Secondary | ICD-10-CM | POA: Diagnosis not present

## 2017-08-28 DIAGNOSIS — I4581 Long QT syndrome: Secondary | ICD-10-CM | POA: Diagnosis not present

## 2017-08-28 DIAGNOSIS — J302 Other seasonal allergic rhinitis: Secondary | ICD-10-CM | POA: Diagnosis not present

## 2017-08-28 DIAGNOSIS — R2681 Unsteadiness on feet: Secondary | ICD-10-CM | POA: Diagnosis not present

## 2017-08-28 DIAGNOSIS — D649 Anemia, unspecified: Secondary | ICD-10-CM | POA: Diagnosis not present

## 2017-08-28 DIAGNOSIS — E162 Hypoglycemia, unspecified: Secondary | ICD-10-CM | POA: Diagnosis not present

## 2017-08-28 DIAGNOSIS — W19XXXA Unspecified fall, initial encounter: Secondary | ICD-10-CM | POA: Diagnosis not present

## 2017-08-28 DIAGNOSIS — F10230 Alcohol dependence with withdrawal, uncomplicated: Secondary | ICD-10-CM | POA: Diagnosis not present

## 2017-08-28 DIAGNOSIS — Y92009 Unspecified place in unspecified non-institutional (private) residence as the place of occurrence of the external cause: Secondary | ICD-10-CM | POA: Diagnosis not present

## 2017-08-28 DIAGNOSIS — K861 Other chronic pancreatitis: Secondary | ICD-10-CM | POA: Diagnosis not present

## 2017-08-28 LAB — URINALYSIS, ROUTINE W REFLEX MICROSCOPIC
Bilirubin Urine: NEGATIVE
Glucose, UA: 150 mg/dL — AB
Ketones, ur: 20 mg/dL — AB
Leukocytes, UA: NEGATIVE
Nitrite: NEGATIVE
PH: 6 (ref 5.0–8.0)
Protein, ur: NEGATIVE mg/dL
SPECIFIC GRAVITY, URINE: 1.023 (ref 1.005–1.030)

## 2017-08-28 LAB — CBC
HCT: 27.2 % — ABNORMAL LOW (ref 36.0–46.0)
HEMOGLOBIN: 9.5 g/dL — AB (ref 12.0–15.0)
MCH: 32.3 pg (ref 26.0–34.0)
MCHC: 34.9 g/dL (ref 30.0–36.0)
MCV: 92.5 fL (ref 78.0–100.0)
PLATELETS: 205 10*3/uL (ref 150–400)
RBC: 2.94 MIL/uL — AB (ref 3.87–5.11)
RDW: 15.5 % (ref 11.5–15.5)
WBC: 4.1 10*3/uL (ref 4.0–10.5)

## 2017-08-28 LAB — BASIC METABOLIC PANEL
ANION GAP: 11 (ref 5–15)
BUN: 16 mg/dL (ref 6–20)
CALCIUM: 7.1 mg/dL — AB (ref 8.9–10.3)
CHLORIDE: 100 mmol/L — AB (ref 101–111)
CO2: 21 mmol/L — ABNORMAL LOW (ref 22–32)
CREATININE: 0.63 mg/dL (ref 0.44–1.00)
GFR calc non Af Amer: >60 mL/min (ref >60)
Glucose, Bld: 114 mg/dL — ABNORMAL HIGH (ref 65–99)
Potassium: 4.3 mmol/L (ref 3.5–5.1)
SODIUM: 132 mmol/L — AB (ref 135–145)

## 2017-08-28 LAB — RAPID URINE DRUG SCREEN, HOSP PERFORMED
AMPHETAMINES: NOT DETECTED
BARBITURATES: NOT DETECTED
BENZODIAZEPINES: POSITIVE — AB
COCAINE: NOT DETECTED
Opiates: POSITIVE — AB
TETRAHYDROCANNABINOL: NOT DETECTED

## 2017-08-28 LAB — MAGNESIUM: MAGNESIUM: 1.5 mg/dL — AB (ref 1.7–2.4)

## 2017-08-28 LAB — HIV ANTIBODY (ROUTINE TESTING W REFLEX): HIV Screen 4th Generation wRfx: NONREACTIVE

## 2017-08-28 LAB — CBG MONITORING, ED: Glucose-Capillary: 112 mg/dL — ABNORMAL HIGH (ref 65–99)

## 2017-08-28 MED ORDER — OXYCODONE-ACETAMINOPHEN 5-325 MG PO TABS: 1.0000 | ORAL_TABLET | Freq: Four times a day (QID) | ORAL | 0 refills | Status: AC | PRN

## 2017-08-28 MED ORDER — FOLIC ACID 1 MG PO TABS: 1.0000 mg | ORAL_TABLET | Freq: Every day | ORAL | 0 refills | Status: AC

## 2017-08-28 MED ORDER — MAGNESIUM 30 MG PO TABS: 30.0000 mg | ORAL_TABLET | Freq: Two times a day (BID) | ORAL | 0 refills | Status: AC

## 2017-08-28 MED ORDER — OXYCODONE-ACETAMINOPHEN 5-325 MG PO TABS: 1.0000 | ORAL_TABLET | Freq: Four times a day (QID) | ORAL | 0 refills | Status: DC | PRN

## 2017-08-28 MED ORDER — THIAMINE HCL 100 MG PO TABS: 100.0000 mg | ORAL_TABLET | Freq: Every day | ORAL | 0 refills | Status: AC

## 2017-08-28 NOTE — Discharge Summary (Signed)
[name], is a 57 y.o. female  DOB 15-Oct-1960  MRN [id number].  Admission date:  08/27/2017  Admitting Physician  No admitting provider for patient encounter.  Discharge Date:  08/28/2017   Primary MD  [name], MD  Recommendations for primary care physician for things to follow:  - please check CBC, BMP regarding next visit   Admission Diagnosis  Hypoglycemia   Discharge Diagnosis  Hypoglycemia    Principal Problem:   Fall at home, initial encounter Active Problems:   Alcohol dependence with uncomplicated withdrawal ([place])   Fall      Past Medical History:  Diagnosis Date  . Accidental drug overdose 10/26/2015  . Acute respiratory failure with hypoxia ([place]) 10/23/2015  . Allergy    SEASONAL  . Anemia   . Anemia, iron deficiency 03/29/2014  . Aortic atherosclerosis ([place])   . Arthritis    "hips, shoulders, hands" (11/17/2016)  . ASD (atrial septal defect)    a. s/p ASD repair in 1973  . Aspiration pneumonia of right lower lobe due to gastric secretions ([place])   . Bronchitis 09/20/2013  . CAD (coronary artery disease)    a. 12/2015: NSTEMI occurring after cardiac arrest secondary to aspiration PNA. Echo w/ EF of 55-60%, no WMA. Outpt ischemic eval needed.  . Cardiac arrest ([place]) 12/22/2015  . Cardiomyopathy   . Chronic bronchitis ([place])   . Chronic lower back pain   . Deaf    "not totally deaf; read lips and can hear some" (11/17/2016)  . Depression   . Diverticulosis   . Dysphagia    due to esophageal infections.  . Fibromyalgia   . Gastritis   . GERD (gastroesophageal reflux disease)   . Hearing impairment   . Hepatic steatosis   . High cholesterol   . History of hiatal hernia   . History of kidney stones   . Hypertension    [name], take htn medication to regulate heart beat.  . Increased anion gap metabolic acidosis 3/97/6734  . Lactic acidosis 12/17/2015  . Major depressive  disorder, recurrent episode, moderate ([place]) 09/18/2014  . Meningitis spinal 1967  . Migraine    "all through my life" (11/17/2016)  . Nausea & vomiting 05/20/2014  . Neurocardiogenic syncope 07/19/2016  . Osteoporosis   . Pancytopenia ([place])   . PMB (postmenopausal bleeding) 02/26/2013  . Pneumonia    "several times" (11/17/2016)  . Pressure ulcer 12/18/2015  . 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 Pulmonary embolism ([place]) 2003   occured post c-section of her daughter  . Shock ([place]) 10/19/2015  . Sleep apnea   . Starvation ketoacidosis 12/17/2015  . Vitamin D deficiency     Past Surgical History:  Procedure Laterality Date  . APPENDECTOMY    . ATRIAL SEPTAL DEFECT(ASD) CLOSURE  1973  . [place]  . CESAREAN SECTION  2003  . COCHLEAR IMPLANT Right   .  ESOPHAGOGASTRODUODENOSCOPY (EGD) WITH PROPOFOL N/A 07/20/2016   Procedure: ESOPHAGOGASTRODUODENOSCOPY (EGD) WITH PROPOFOL;  Surgeon: Gatha Mayer, MD;  Location: [facility];  Service: Endoscopy;  Laterality: N/A;  . EYE MUSCLE SURGERY Bilateral   . INCISION AND DRAINAGE OF WOUND  1993   S/P c-section  . LAPAROSCOPIC CHOLECYSTECTOMY  2012  . MITRAL VALVE REPAIR  05/2005  . TONSILLECTOMY AND ADENOIDECTOMY    . TUBAL LIGATION  1993  . TYMPANOSTOMY TUBE PLACEMENT Bilateral "lots"       History of present illness and  Hospital Course:     Kindly see H&P for history of present illness and admission details, please review complete Labs, Consult reports and Test reports for all details in brief  HPI  from the history and physical done on the day of admission   [name] is a 57 y.o. female with hearing impairment, alcohol dependence, chronic pancreatitis with remote hx of withdrawal seizure who presents after unwitnessed fall from home. She does not recall falling but thinks she may have fallen while  intoxicated in the middle of the night while going to the bathroom and crawled her way back into bed. Normally drinks at least 1-1.5 pints of vodka a day and last drink was sometime last evening between 8pm and 12 am. Daughter (who is 74 yo) sometimes stays with her. During interview, reports feeling thirsty, tremulous and exquisite tenderness over her occiput and posterior shoulders bilaterally. Denies urinary or bowel incontinence or evidence thereof at the home. She reports that she had been drinking unknown quantity of vodka last evening and then in the morning was unable to get out of bed due to severe pain in the back of her head and generalized weakness -- she had been in bed all day until this afternoon when she was able to reach her PCP clinic. Point of care glucose found to be 19 mg/dl at clinic. Patient sent to ED from PCP office for further monitoring and correction of hypoglycemia.   Of note, patient was recently seen at [facility] ED for EtOH withdrawal sx in 08/15/2017 last week. She had reported withdrawal seizure several years ago but per chart review in the past year has not had documented seizure. Has not been compliant with most of her medications. She also reports being hearing impaired but able to "read lips" and declined sign language interpreter during this interview. She has not been taking her home medicines consistently.     ED course:  VS: HR 88, BP 133/89 SO2 96%  - CT head: no hemorrhage or acute abnormalities; scalp hematoma - received 1L NS and thiamine 100mg  IV     Hospital Course   For without fracture in the setting of intoxication - This is secondary to intoxication, CT head without hemorrhage, CT neck shows chronic degenerative disease, but no fracture, she will be discharged on Percocet.Given prescription for total of 12 tablets.  History of chronic pancreatitis - Continue with home meds  Prolonged QTC - QTC was 520 on admission, it was 5:30 last month, she  received 2 g of mag sulfate, she will be discharged on magnesium supplement  Hypomagnesemia - Repleted  Hypoglycemia - Resolved, with no recurrence  Alcohol abuse - No evidence of DTs at time of discharge, she will be discharged on thiamine and folic acid.     Discharge Condition:  stable        Discharge Instructions  and  Discharge Medications     Allergies as of 08/28/2017  Reactions   Depakote [divalproex Sodium] Anaphylaxis   Diazepam Other (See Comments)   Died on operating table. TOLERATES ATIVAN AND LIBRIUM W/O DIFFICULTY   Valium Anaphylaxis   Tolerates Ativan and Librium w/o complication   Tizanidine Hcl Other (See Comments)   Went into shock with hypotension, AMS, AKI, asp pna after unintentional OD - see hosp 6/11-19/2017.      Medication List    STOP taking these medications   Metoclopramide HCl 5 MG Tbdp     TAKE these medications   acetaminophen 325 MG tablet Commonly known as:  TYLENOL Take 2 tablets (650 mg total) by mouth every 6 (six) hours as needed for mild pain (or Fever >/= 101).   ALLEGRA ALLERGY 180 MG tablet Generic drug:  fexofenadine Take 180 mg by mouth daily.   aspirin 81 MG chewable tablet Chew 81 mg by mouth daily.   atorvastatin 40 MG tablet Commonly known as:  LIPITOR Take 1 tablet (40 mg total) by mouth daily at 6 PM.   Fish Oil 1000 MG Caps Take 1,000 mg by mouth daily.   fluticasone 50 MCG/ACT nasal spray Commonly known as:  FLONASE Place 2 sprays into both nostrils daily.   folic acid 1 MG tablet Commonly known as:  FOLVITE Take 1 tablet (1 mg total) by mouth daily.   lipase/protease/amylase 36000 UNITS Cpep capsule Commonly known as:  CREON Take 1 capsule (36,000 Units total) by mouth 3 (three) times daily before meals.   metoprolol tartrate 25 MG tablet Commonly known as:  LOPRESSOR Take 1 tablet (25 mg total) by mouth 2 (two) times daily.   mirabegron ER 50 MG Tb24 tablet Commonly known as:   MYRBETRIQ Take 1 tablet (50 mg total) by mouth daily.   nitroGLYCERIN 0.4 MG SL tablet Commonly known as:  NITROSTAT Place 1 tablet (0.4 mg total) under the tongue every 5 (five) minutes as needed for chest pain.   omeprazole 20 MG capsule Commonly known as:  PRILOSEC Take 1 capsule (20 mg total) by mouth daily. What changed:  how much to take   oxyCODONE-acetaminophen 5-325 MG tablet Commonly known as:  PERCOCET Take 1 tablet by mouth every 6 (six) hours as needed for up to 3 days for severe pain.   prazosin 2 MG capsule Commonly known as:  MINIPRESS Take 2 mg by mouth at bedtime.   PRENATAL VITAMINS PO Take 1 tablet by mouth daily.   PRESCRIPTION MEDICATION Inhale into the lungs at bedtime. CPAP   thiamine 100 MG tablet Take 1 tablet (100 mg total) by mouth daily.   VIIBRYD 40 MG Tabs Generic drug:  Vilazodone HCl Take 40 mg by mouth daily.   vitamin B-12 500 MCG tablet Commonly known as:  CYANOCOBALAMIN Take 500 mcg by mouth daily.   vitamin C 1000 MG tablet Take 1,000 mg by mouth daily.   Vitamin D3 2000 units Tabs Take 4,000 Units by mouth daily.         Diet and Activity recommendation: See Discharge Instructions above   Consults obtained -  None   Major procedures and Radiology Reports - PLEASE review detailed and final reports for all details, in brief -      Dg Cervical Spine 2 Or 3 Views  Result Date: 08/27/2017 CLINICAL DATA:  Right-sided neck pain following fall yesterday EXAM: CERVICAL SPINE - 3 VIEW COMPARISON:  None. FINDINGS: Seven cervical segments are well visualized. Osteophytic changes are noted from C4-C7. Facet hypertrophic changes are seen. Mild  degenerative anterolisthesis of C3 on C4 is noted. No soft tissue changes are seen. The odontoid is within normal limits. Visualized upper chest is unremarkable. IMPRESSION: Multilevel degenerative change without acute abnormality. Electronically Signed   By: Inez Catalina M.D.   On:  08/27/2017 16:10   Ct Head Wo Contrast  Result Date: 08/27/2017 CLINICAL DATA:  [place] and hit back of head with neck pain EXAM: CT HEAD WITHOUT CONTRAST CT CERVICAL SPINE WITHOUT CONTRAST TECHNIQUE: Multidetector CT imaging of the head and cervical spine was performed following the standard protocol without intravenous contrast. Multiplanar CT image reconstructions of the cervical spine were also generated. COMPARISON:  CT brain 07/05/2017, neck radiograph 05/10/2017 FINDINGS: CT HEAD FINDINGS Brain: No acute territorial infarction, hemorrhage or intracranial mass is visualized. Artifact from right-sided implant limits the study. Mild to moderate atrophy. Moderate small vessel ischemic changes of the white matter. Stable ventricle size. Vascular: No hyperdense vessels.  Carotid vascular calcification Skull: Right cochlear implant. Trace fluid in the left mastoids. No fracture Sinuses/Orbits: Mild mucosal thickening in the maxillary and ethmoid sinuses. No acute orbital abnormality Other: Moderate right parietooccipital scalp hematoma CT CERVICAL SPINE FINDINGS Alignment: No subluxation.  Facet alignment within normal limits. Skull base and vertebrae: Craniovertebral junction is intact. No fracture is seen. Partial posterior fusion of C2-C3. Soft tissues and spinal canal: No prevertebral fluid or swelling. No visible canal hematoma. Disc levels: Moderate to marked degenerative changes at C4-C5, C5-C6 and C6-C7 with mild degenerative changes at C2-C3 and C3-C4. Multiple level right greater than left hypertrophic facet arthropathy. Upper chest: Lung apices are clear. Other: None IMPRESSION: 1. No definite CT evidence for acute intracranial abnormality allowing for artifact arising from right cochlear implant. Atrophy and small vessel ischemic changes of the white matter 2. Moderate right parietooccipital scalp hematoma 3. Moderate to marked degenerative changes of the spine. No definite acute osseous abnormality.  Electronically Signed   By: Donavan Foil M.D.   On: 08/27/2017 20:11   Ct Cervical Spine Wo Contrast  Result Date: 08/27/2017 CLINICAL DATA:  [place] and hit back of head with neck pain EXAM: CT HEAD WITHOUT CONTRAST CT CERVICAL SPINE WITHOUT CONTRAST TECHNIQUE: Multidetector CT imaging of the head and cervical spine was performed following the standard protocol without intravenous contrast. Multiplanar CT image reconstructions of the cervical spine were also generated. COMPARISON:  CT brain 07/05/2017, neck radiograph 05/10/2017 FINDINGS: CT HEAD FINDINGS Brain: No acute territorial infarction, hemorrhage or intracranial mass is visualized. Artifact from right-sided implant limits the study. Mild to moderate atrophy. Moderate small vessel ischemic changes of the white matter. Stable ventricle size. Vascular: No hyperdense vessels.  Carotid vascular calcification Skull: Right cochlear implant. Trace fluid in the left mastoids. No fracture Sinuses/Orbits: Mild mucosal thickening in the maxillary and ethmoid sinuses. No acute orbital abnormality Other: Moderate right parietooccipital scalp hematoma CT CERVICAL SPINE FINDINGS Alignment: No subluxation.  Facet alignment within normal limits. Skull base and vertebrae: Craniovertebral junction is intact. No fracture is seen. Partial posterior fusion of C2-C3. Soft tissues and spinal canal: No prevertebral fluid or swelling. No visible canal hematoma. Disc levels: Moderate to marked degenerative changes at C4-C5, C5-C6 and C6-C7 with mild degenerative changes at C2-C3 and C3-C4. Multiple level right greater than left hypertrophic facet arthropathy. Upper chest: Lung apices are clear. Other: None IMPRESSION: 1. No definite CT evidence for acute intracranial abnormality allowing for artifact arising from right cochlear implant. Atrophy and small vessel ischemic changes of the white matter 2. Moderate right  parietooccipital scalp hematoma 3. Moderate to marked degenerative  changes of the spine. No definite acute osseous abnormality. Electronically Signed   By: Donavan Foil M.D.   On: 08/27/2017 20:11   Ct Abdomen Pelvis W Contrast  Result Date: 08/16/2017 CLINICAL DATA:  Alcohol withdrawal. Acute generalized abdominal pain. EXAM: CT ABDOMEN AND PELVIS WITH CONTRAST TECHNIQUE: Multidetector CT imaging of the abdomen and pelvis was performed using the standard protocol following bolus administration of intravenous contrast. CONTRAST:  47mL ISOVUE-300 IOPAMIDOL (ISOVUE-300) INJECTION 61% COMPARISON:  None. FINDINGS: Lower chest: Moderate sized hiatal hernia. Normal size heart without pericardial effusion or thickening. Minimal atelectasis noted at the left lung base. No effusion or pneumothorax. Hepatobiliary: Status post cholecystectomy. Mild hepatic steatosis without space-occupying mass. No biliary dilatation. Pancreas: No inflammatory change.  No mass or ductal dilatation. Spleen: Normal Adrenals/Urinary Tract: Adrenal glands are unremarkable. Kidneys are normal, without renal calculi, focal lesion, or hydronephrosis. Bladder is unremarkable. Stomach/Bowel: Appendectomy. Decompressed stomach with normal small bowel rotation. No bowel obstruction or inflammation. Colonic diverticulosis along the descending sigmoid colon without acute inflammation. Vascular/Lymphatic: Mild aortic atherosclerosis without aneurysm. No lymphadenopathy. Patent portal splenic veins. Reproductive: Uterus and bilateral adnexa are unremarkable. Other: Small fat containing periumbilical hernia. No abdominopelvic ascites. Musculoskeletal: Stable L1 compression with slight retropulsion of the posterosuperior aspect of L1. Thoracolumbar spondylosis is noted otherwise as before. Old remote bilateral posterior T12 fractures. IMPRESSION: 1. Hepatic steatosis. 2. Unremarkable appearance of pancreas without peripancreatic inflammation 3. Moderate-sized hiatal hernia. 4. Colonic diverticulosis without acute  diverticulitis. 5. Thoracolumbar spondylosis with chronic L1 compression and retropulsion. Electronically Signed   By: Ashley Royalty M.D.   On: 08/16/2017 01:10    Micro Results     No results found for this or any previous visit (from the past 240 hour(s)).     Today   Subjective:   [name] today has no headache,no chest or abdominal pain,no new weakness tingling or numbness, report is feeling much better, still complains of some neck pain.  Objective:   Blood pressure (!) 143/98, pulse 93, resp. rate 13, SpO2 100 %.   Intake/Output Summary (Last 24 hours) at 08/28/2017 1001 Last data filed at 08/28/2017 0231 Gross per 24 hour  Intake 1050 ml  Output -  Net 1050 ml    Exam Awake Alert, Oriented x 3, No new F.N deficits, Normal affect, noted tremors or sign of withdrawals [place].AT,PERRAL Symmetrical Chest wall movement, Good air movement bilaterally, CTAB RRR,No Gallops,Rubs or new Murmurs, No Parasternal Heave +ve B.Sounds, Abd Soft, Non tender, No organomegaly appriciated, No rebound -guarding or rigidity. No Cyanosis, Clubbing or edema, No new Rash or bruise  Data Review   CBC w Diff:  Lab Results  Component Value Date   WBC 4.1 08/28/2017   HGB 9.5 (L) 08/28/2017   HGB 10.6 (L) 07/24/2016   HCT 27.2 (L) 08/28/2017   HCT 31.2 (L) 07/24/2016   PLT 205 08/28/2017   PLT 176 07/24/2016   LYMPHOPCT 15 08/27/2017   MONOPCT 6 08/27/2017   EOSPCT 0 08/27/2017   BASOPCT 1 08/27/2017    CMP:  Lab Results  Component Value Date   NA 132 (L) 08/28/2017   NA 144 03/04/2017   K 4.3 08/28/2017   CL 100 (L) 08/28/2017   CO2 21 (L) 08/28/2017   BUN 16 08/28/2017   BUN 24 03/04/2017   CREATININE 0.63 08/28/2017   CREATININE 0.60 01/26/2016   PROT 7.6 08/15/2017   PROT 6.4 03/04/2017  ALBUMIN 4.5 08/15/2017   ALBUMIN 3.9 03/04/2017   BILITOT 2.5 (H) 08/15/2017   BILITOT 0.3 03/04/2017   ALKPHOS 95 08/15/2017   AST 68 (H) 08/15/2017   ALT 35 08/15/2017   .   Total Time in preparing paper work, data evaluation and todays exam - 47 minutes  Phillips Climes M.D on 08/28/2017 at 10:01 AM  [facility]   Office  [phone]

## 2017-08-28 NOTE — ED Notes (Signed)
D/c reviewed with patient. No further questions at this time 

## 2017-08-28 NOTE — ED Notes (Signed)
Chicken noodle Soup requested for patient

## 2017-08-28 NOTE — Evaluation (Signed)
Physical Therapy Treatment Patient Details Name: [name] MRN: [id number] DOB: 1961/03/16 Today's Date: 08/28/2017    History of Present Illness Pt adm after fall while intoxicated. CT negative for acute injury. Scalp hematoma present. PMH - ETOH abuse, NSTEMI, cardiac arrest, HTN, recurrent aspiration, hearing impaired.     PT Comments    Pt presents to PT at baseline with mobility. Pt with soreness/pain as expected from fall while intoxicated. Pt will remain at high fall risk as long as she continues to drink ETOH. No further PT recommended.   Follow Up Recommendations  No PT follow up     Equipment Recommendations  None recommended by PT    Recommendations for Other Services       Precautions / Restrictions Precautions Precautions: Fall Precaution Comments: Pt with soft cervical collar on for pain control Restrictions Weight Bearing Restrictions: No    Mobility  Bed Mobility Overal bed mobility: Independent                Transfers Overall transfer level: Independent Equipment used: None                Ambulation/Gait Ambulation/Gait assistance: Modified independent (Device/Increase time) Ambulation Distance (Feet): 250 Feet Assistive device: None Gait Pattern/deviations: Step-through pattern;Decreased stride length Gait velocity: decr Gait velocity interpretation: 1.31 - 2.62 ft/sec, indicative of limited community ambulator General Gait Details: Amb with hips in external rotation (per old notes this appears baseline)   Marine scientist [place] (Stroke Patients Only)       Balance Overall balance assessment: Needs assistance Sitting-balance support: No upper extremity supported;Feet supported Sitting balance-Leahy Scale: Good     Standing balance support: No upper extremity supported;During functional activity Standing balance-Leahy Scale: Good                               Cognition Arousal/Alertness: Awake/alert Behavior During Therapy: WFL for tasks assessed/performed Overall Cognitive Status: Within Functional Limits for tasks assessed                                        Exercises      General Comments        Pertinent Vitals/Pain Pain Assessment: Faces Faces Pain Scale: Hurts little more Pain Location: head and neck Pain Descriptors / Indicators: Grimacing Pain Intervention(s): Limited activity within patient's tolerance;Monitored during session    Home Living Family/patient expects to be discharged to:: Private residence Living Arrangements: Children(15 y.o daughter lives with sometimes) Available Help at Discharge: Family;Available PRN/intermittently Type of Home: Apartment Home Access: Level entry   Home Layout: One level Home Equipment: Tub bench      Prior Function Level of Independence: Independent      Comments: activity tolerance limited   PT Goals (current goals can now be found in the care plan section) Acute Rehab PT Goals PT Goal Formulation: All assessment and education complete, DC therapy    Frequency           PT Plan      Co-evaluation              AM-PAC PT "6 Clicks" Daily Activity  Outcome Measure  Difficulty turning over in bed (including adjusting bedclothes, sheets and blankets)?: None Difficulty  moving from lying on back to sitting on the side of the bed? : None Difficulty sitting down on and standing up from a chair with arms (e.g., wheelchair, bedside commode, etc,.)?: None Help needed moving to and from a bed to chair (including a wheelchair)?: None Help needed walking in hospital room?: None Help needed climbing 3-5 steps with a railing? : None 6 Click Score: 24    End of Session   Activity Tolerance: Patient tolerated treatment well Patient left: in bed;with call bell/phone within reach Nurse Communication: Mobility status PT Visit Diagnosis: Unsteadiness on  feet (R26.81)     Time: 7026-3785 PT Time Calculation (min) (ACUTE ONLY): 10 min  Charges:                       G CodesSuanne Marker PT [place] 08/28/2017, 9:23 AM

## 2017-08-28 NOTE — ED Notes (Signed)
Physical Therapist at bedside.

## 2017-08-30 DIAGNOSIS — R69 Illness, unspecified: Secondary | ICD-10-CM | POA: Diagnosis not present

## 2017-09-01 ENCOUNTER — Encounter: Payer: Self-pay | Admitting: Physician Assistant

## 2017-09-01 DIAGNOSIS — Z9889 Other specified postprocedural states: Secondary | ICD-10-CM | POA: Diagnosis not present

## 2017-09-06 DIAGNOSIS — R69 Illness, unspecified: Secondary | ICD-10-CM | POA: Diagnosis not present

## 2017-09-07 ENCOUNTER — Emergency Department (HOSPITAL_COMMUNITY)
Admission: EM | Admit: 2017-09-07 | Discharge: 2017-09-07 | Disposition: A | Discharge status: Home / Self Care | Payer: Medicare HMO | Attending: Emergency Medicine | Admitting: Emergency Medicine

## 2017-09-07 ENCOUNTER — Emergency Department (HOSPITAL_COMMUNITY): Payer: Medicare HMO

## 2017-09-07 DIAGNOSIS — Y939 Activity, unspecified: Secondary | ICD-10-CM | POA: Insufficient documentation

## 2017-09-07 DIAGNOSIS — S0003XA Contusion of scalp, initial encounter: Secondary | ICD-10-CM | POA: Diagnosis not present

## 2017-09-07 DIAGNOSIS — S0083XA Contusion of other part of head, initial encounter: Secondary | ICD-10-CM | POA: Insufficient documentation

## 2017-09-07 DIAGNOSIS — Z7982 Long term (current) use of aspirin: Secondary | ICD-10-CM | POA: Insufficient documentation

## 2017-09-07 DIAGNOSIS — Y999 Unspecified external cause status: Secondary | ICD-10-CM | POA: Diagnosis not present

## 2017-09-07 DIAGNOSIS — W19XXXA Unspecified fall, initial encounter: Secondary | ICD-10-CM | POA: Diagnosis not present

## 2017-09-07 DIAGNOSIS — Z8674 Personal history of sudden cardiac arrest: Secondary | ICD-10-CM | POA: Insufficient documentation

## 2017-09-07 DIAGNOSIS — R112 Nausea with vomiting, unspecified: Secondary | ICD-10-CM | POA: Diagnosis not present

## 2017-09-07 DIAGNOSIS — R1013 Epigastric pain: Secondary | ICD-10-CM

## 2017-09-07 DIAGNOSIS — F1010 Alcohol abuse, uncomplicated: Secondary | ICD-10-CM | POA: Insufficient documentation

## 2017-09-07 DIAGNOSIS — Z79899 Other long term (current) drug therapy: Secondary | ICD-10-CM | POA: Diagnosis not present

## 2017-09-07 DIAGNOSIS — I2510 Atherosclerotic heart disease of native coronary artery without angina pectoris: Secondary | ICD-10-CM | POA: Diagnosis not present

## 2017-09-07 DIAGNOSIS — R51 Headache: Secondary | ICD-10-CM | POA: Diagnosis not present

## 2017-09-07 DIAGNOSIS — Y929 Unspecified place or not applicable: Secondary | ICD-10-CM | POA: Insufficient documentation

## 2017-09-07 DIAGNOSIS — I10 Essential (primary) hypertension: Secondary | ICD-10-CM | POA: Diagnosis not present

## 2017-09-07 DIAGNOSIS — R42 Dizziness and giddiness: Secondary | ICD-10-CM | POA: Diagnosis not present

## 2017-09-07 DIAGNOSIS — I252 Old myocardial infarction: Secondary | ICD-10-CM | POA: Diagnosis not present

## 2017-09-07 DIAGNOSIS — F10120 Alcohol abuse with intoxication, uncomplicated: Secondary | ICD-10-CM | POA: Diagnosis not present

## 2017-09-07 DIAGNOSIS — R69 Illness, unspecified: Secondary | ICD-10-CM | POA: Diagnosis not present

## 2017-09-07 LAB — COMPREHENSIVE METABOLIC PANEL
ALT: 24 U/L (ref 14–54)
ANION GAP: 16 — AB (ref 5–15)
AST: 53 U/L — AB (ref 15–41)
Albumin: 4.2 g/dL (ref 3.5–5.0)
Alkaline Phosphatase: 121 U/L (ref 38–126)
BUN: 13 mg/dL (ref 6–20)
CHLORIDE: 96 mmol/L — AB (ref 101–111)
CO2: 26 mmol/L (ref 22–32)
Calcium: 8.7 mg/dL — ABNORMAL LOW (ref 8.9–10.3)
Creatinine, Ser: 0.63 mg/dL (ref 0.44–1.00)
GFR calc Af Amer: >60 mL/min (ref >60)
Glucose, Bld: 144 mg/dL — ABNORMAL HIGH (ref 65–99)
POTASSIUM: 4.2 mmol/L (ref 3.5–5.1)
Sodium: 138 mmol/L (ref 135–145)
Total Bilirubin: 1.7 mg/dL — ABNORMAL HIGH (ref 0.3–1.2)
Total Protein: 7.7 g/dL (ref 6.5–8.1)

## 2017-09-07 LAB — CBG MONITORING, ED: GLUCOSE-CAPILLARY: 141 mg/dL — AB (ref 65–99)

## 2017-09-07 LAB — CBC WITH DIFFERENTIAL/PLATELET
BASOS ABS: 0 10*3/uL (ref 0.0–0.1)
Basophils Relative: 1 %
Eosinophils Absolute: 0 10*3/uL (ref 0.0–0.7)
Eosinophils Relative: 0 %
HCT: 37.6 % (ref 36.0–46.0)
Hemoglobin: 12.8 g/dL (ref 12.0–15.0)
Lymphocytes Relative: 12 %
Lymphs Abs: 0.6 10*3/uL — ABNORMAL LOW (ref 0.7–4.0)
MCH: 31.5 pg (ref 26.0–34.0)
MCHC: 34 g/dL (ref 30.0–36.0)
MCV: 92.6 fL (ref 78.0–100.0)
MONO ABS: 0.3 10*3/uL (ref 0.1–1.0)
Monocytes Relative: 6 %
Neutro Abs: 4.1 10*3/uL (ref 1.7–7.7)
Neutrophils Relative %: 81 %
PLATELETS: 216 10*3/uL (ref 150–400)
RBC: 4.06 MIL/uL (ref 3.87–5.11)
RDW: 15.6 % — AB (ref 11.5–15.5)
WBC: 5.1 10*3/uL (ref 4.0–10.5)

## 2017-09-07 LAB — LIPASE, BLOOD: LIPASE: 36 U/L (ref 11–51)

## 2017-09-07 MED ORDER — CHLORDIAZEPOXIDE HCL 25 MG PO CAPS: ORAL_CAPSULE | ORAL | 0 refills | Status: AC

## 2017-09-07 MED ORDER — ONDANSETRON 4 MG PO TBDP: ORAL_TABLET | ORAL | 0 refills | Status: AC

## 2017-09-07 MED ORDER — MORPHINE SULFATE (PF) 4 MG/ML IV SOLN
4.0000 mg | Freq: Once | INTRAVENOUS | Status: AC
  Administered 2017-09-07: 4 mg via INTRAVENOUS
  Filled 2017-09-07: qty 1

## 2017-09-07 MED ORDER — SODIUM CHLORIDE 0.9 % IV BOLUS
1000.0000 mL | Freq: Once | INTRAVENOUS | Status: AC
  Administered 2017-09-07: 1000 mL via INTRAVENOUS

## 2017-09-07 MED ORDER — ONDANSETRON HCL 4 MG/2ML IJ SOLN
4.0000 mg | Freq: Once | INTRAMUSCULAR | Status: AC
Start: 2017-09-07 — End: 2017-09-07
  Administered 2017-09-07: 4 mg via INTRAVENOUS
  Filled 2017-09-07: qty 2

## 2017-09-07 MED ORDER — CHLORDIAZEPOXIDE HCL 25 MG PO CAPS
100.0000 mg | ORAL_CAPSULE | Freq: Once | ORAL | Status: AC
  Administered 2017-09-07: 100 mg via ORAL
  Filled 2017-09-07: qty 4

## 2017-09-07 MED ORDER — PROCHLORPERAZINE EDISYLATE 10 MG/2ML IJ SOLN
5.0000 mg | Freq: Once | INTRAMUSCULAR | Status: AC
  Administered 2017-09-07: 5 mg via INTRAVENOUS
  Filled 2017-09-07: qty 2

## 2017-09-07 MED ORDER — LORAZEPAM 2 MG/ML IJ SOLN
1.0000 mg | Freq: Once | INTRAMUSCULAR | Status: AC
  Administered 2017-09-07: 1 mg via INTRAVENOUS
  Filled 2017-09-07: qty 1

## 2017-09-07 MED ORDER — VITAMIN B-1 100 MG PO TABS
100.0000 mg | ORAL_TABLET | Freq: Once | ORAL | Status: AC
  Administered 2017-09-07: 100 mg via ORAL
  Filled 2017-09-07: qty 1

## 2017-09-07 NOTE — ED Triage Notes (Signed)
Transported by [facility] from home--hx of alcoholism, reports daily ETOH use. Pt reports last drink occurred last night at 2300. Today presents with tremors, n/v and dizziness per EMS.

## 2017-09-07 NOTE — ED Notes (Signed)
ED Provider at bedside. 

## 2017-09-07 NOTE — ED Provider Notes (Signed)
[facility] DEPT Provider Note   CSN: [id number] Arrival date & time: 09/07/17  1654     History   Chief Complaint Chief Complaint  Patient presents with  . Alcohol Problem    HPI [name] is a 57 y.o. female.  57 yo F with a chief complaint of abdominal pain nausea and vomiting.  This been going on for the past day.  She is a very heavy drinker and is not been able to keep anything down.  She is having some epigastric pain that radiates to her back.  She denies fevers or chills.  She also fell she believes yesterday or the day before and struck the back of her head.  Complaining mostly of headache and epigastric pain.  She is worried because she has been shaking more and is worried she is going to have a seizure.  The history is provided by the patient.  Alcohol Problem  This is a new problem. The current episode started 6 to 12 hours ago. The problem occurs constantly. The problem has not changed since onset.Associated symptoms include abdominal pain and headaches. Pertinent negatives include no chest pain and no shortness of breath. Nothing aggravates the symptoms. Nothing relieves the symptoms. She has tried nothing for the symptoms. The treatment provided no relief.    Past Medical History:  Diagnosis Date  . Accidental drug overdose 10/26/2015  . Acute respiratory failure with hypoxia ([place]) 10/23/2015  . Allergy    SEASONAL  . Anemia   . Anemia, iron deficiency 03/29/2014  . Aortic atherosclerosis ([place])   . Arthritis    "hips, shoulders, hands" (11/17/2016)  . ASD (atrial septal defect)    a. s/p ASD repair in 1973  . Aspiration pneumonia of right lower lobe due to gastric secretions ([place])   . Bronchitis 09/20/2013  . CAD (coronary artery disease)    a. 12/2015: NSTEMI occurring after cardiac arrest secondary to aspiration PNA. Echo w/ EF of 55-60%, no WMA. Outpt ischemic eval needed.  . Cardiac arrest ([place]) 12/22/2015  . Cardiomyopathy     . Chronic bronchitis ([place])   . Chronic lower back pain   . Deaf    "not totally deaf; read lips and can hear some" (11/17/2016)  . Depression   . Diverticulosis   . Dysphagia    due to esophageal infections.  . Fibromyalgia   . Gastritis   . GERD (gastroesophageal reflux disease)   . Hearing impairment   . Hepatic steatosis   . High cholesterol   . History of hiatal hernia   . History of kidney stones   . Hypertension    [name], take htn medication to regulate heart beat.  . Increased anion gap metabolic acidosis 7/82/9562  . Lactic acidosis 12/17/2015  . Major depressive disorder, recurrent episode, moderate ([place]) 09/18/2014  . Meningitis spinal 1967  . Migraine    "all through my life" (11/17/2016)  . Nausea & vomiting 05/20/2014  . Neurocardiogenic syncope 07/19/2016  . Osteoporosis   . Pancytopenia ([place])   . PMB (postmenopausal bleeding) 02/26/2013  . Pneumonia    "several times" (11/17/2016)  . Pressure ulcer 12/18/2015  . 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 Pulmonary embolism ([place]) 2003   occured post c-section of her daughter  . Shock ([place]) 10/19/2015  . Sleep apnea   . Starvation ketoacidosis 12/17/2015  .  Vitamin D deficiency     Patient Active Problem List   Diagnosis Date Noted  . Fall at home, initial encounter 08/27/2017  . Fall 08/27/2017  . Chronic pancreatitis due to acute alcohol intoxication ([place]) 11/17/2016  . Candida infection, esophageal ([place])   . Esophageal ring, acquired   . Abdominal pain, epigastric   . Bradycardia   . Hypokalemia   . NSTEMI (non-ST elevated myocardial infarction) ([place]) 12/19/2015  . Typical atrial flutter ([place])   . Protein-calorie malnutrition, severe 12/18/2015  . Alcohol abuse 12/17/2015  . Essential hypertension 12/06/2015  . Non-traumatic compression fracture of vertebral column with routine healing 07/17/2014   . Osteoporosis 07/17/2014  . Odynophagia 07/09/2014  . Esophageal dysphagia 07/08/2014  . Chest pain 05/20/2014  . 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 Hypercholesterolemia without hypertriglyceridemia 03/29/2014  . Fatigue 03/29/2014  . Classical migraine with intractable migraine 03/29/2014  . Disorder of mitral valve 03/29/2014  . Atypical migraine 03/29/2014  . Atrial septal defect of fossa ovalis 03/29/2014  . Arthralgia of multiple joints 03/29/2014  . Fibrositis 03/29/2014  . Abdominal pain 12/20/2013  . Absolute anemia 09/20/2013  . Family history of breast cancer 07/13/2013  . Palpitation 07/02/2013  . History of mitral valve repair 07/02/2013  . Status post patch closure of ASD 07/02/2013  . Hyperlipidemia 07/02/2013  . OAB (overactive bladder) 06/13/2013  . Ovarian cyst, left 03/15/2013  . Vaginal lesion 03/15/2013  . Ovarian mass 03/15/2013  . Maternal DVT (deep vein thrombosis), history of 02/26/2013  . Alcohol dependence with uncomplicated withdrawal ([place]) 11/07/2012  . Unspecified vitamin D deficiency 08/17/2012  . L1 vertebral fracture ([place]) 08/01/2012  . Spinal compression fracture ([place]) 07/12/2012  . Menopausal state 07/12/2012  . Psoriasis 07/12/2012  . Severe episode of recurrent major depressive disorder ([place]) 12/07/2011  . Cardiomyopathy 03/16/2011  . Hearing impairment   . Arthritis     Past Surgical History:  Procedure Laterality Date  . APPENDECTOMY    . ATRIAL SEPTAL DEFECT(ASD) CLOSURE  1973  . [place]  . CESAREAN SECTION  2003  . COCHLEAR IMPLANT Right   . ESOPHAGOGASTRODUODENOSCOPY (EGD) WITH PROPOFOL N/A 07/20/2016   Procedure: ESOPHAGOGASTRODUODENOSCOPY (EGD) WITH PROPOFOL;  Surgeon: Gatha Mayer, MD;  Location: [facility];  Service: Endoscopy;  Laterality: N/A;  . EYE MUSCLE SURGERY Bilateral   . INCISION AND DRAINAGE OF WOUND  1993   S/P c-section  . LAPAROSCOPIC  CHOLECYSTECTOMY  2012  . MITRAL VALVE REPAIR  05/2005  . TONSILLECTOMY AND ADENOIDECTOMY    . TUBAL LIGATION  1993  . TYMPANOSTOMY TUBE PLACEMENT Bilateral "lots"     OB History    Gravida  2   Para  2   Term  2   Preterm  0   AB  0   Living        SAB  0   TAB  0   Ectopic  0   Multiple      Live Births               Home Medications    Prior to Admission medications   Medication Sig Start Date End Date Taking? Authorizing Provider  acetaminophen (TYLENOL) 325 MG tablet Take 650 mg by mouth every 6 (six) hours as needed for moderate pain.   Yes [provider]  Ascorbic Acid (VITAMIN C) 1000 MG tablet Take 1,000 mg by mouth daily.   Yes [provider]  aspirin 81  MG chewable tablet Chew 81 mg by mouth daily.   Yes [provider]  atorvastatin (LIPITOR) 40 MG tablet Take 1 tablet (40 mg total) by mouth daily at 6 PM. 03/09/17  Yes [name], MD  Cholecalciferol (VITAMIN D3) 2000 units TABS Take 4,000 Units by mouth daily.    Yes [provider]  fexofenadine (ALLEGRA ALLERGY) 180 MG tablet Take 180 mg by mouth daily.   Yes [provider]  fluticasone (FLONASE) 50 MCG/ACT nasal spray Place 2 sprays into both nostrils daily.   Yes [provider]  folic acid (FOLVITE) 1 MG tablet Take 1 tablet (1 mg total) by mouth daily. 08/28/17  Yes Elgergawy, Silver Huguenin, MD  lipase/protease/amylase (CREON) 36000 UNITS CPEP capsule Take 1 capsule (36,000 Units total) by mouth 3 (three) times daily before meals. 02/16/17  Yes [name], MD  magnesium 30 MG tablet Take 1 tablet (30 mg total) by mouth 2 (two) times daily. 08/28/17  Yes Elgergawy, Silver Huguenin, MD  metoCLOPramide (REGLAN) 5 MG/5ML solution Take 5 mg by mouth 4 (four) times daily.   Yes [provider]  metoprolol tartrate (LOPRESSOR) 25 MG tablet Take 1 tablet (25 mg total) by mouth 2 (two) times daily. 03/09/17  Yes [name], MD  mirabegron ER  (MYRBETRIQ) 50 MG TB24 tablet Take 1 tablet (50 mg total) by mouth daily. 04/30/17  Yes [name], MD  Omega-3 Fatty Acids (FISH OIL) 1000 MG CAPS Take 1,000 mg by mouth daily.    Yes [provider]  omeprazole (PRILOSEC) 40 MG capsule Take 40 mg by mouth daily.   Yes [provider]  prazosin (MINIPRESS) 2 MG capsule Take 2 mg by mouth at bedtime.  02/17/16  Yes [provider]  Prenatal Multivit-Min-Fe-FA (PRENATAL VITAMINS PO) Take 1 tablet by mouth daily.   Yes [provider]  thiamine 100 MG tablet Take 1 tablet (100 mg total) by mouth daily. 08/28/17  Yes Elgergawy, Silver Huguenin, MD  Vilazodone HCl (VIIBRYD) 40 MG TABS Take 40 mg by mouth daily.    Yes [provider]  vitamin B-12 (CYANOCOBALAMIN) 500 MCG tablet Take 500 mcg by mouth daily.   Yes [provider]  acetaminophen (TYLENOL) 325 MG tablet Take 2 tablets (650 mg total) by mouth every 6 (six) hours as needed for mild pain (or Fever >/= 101). Patient not taking: Reported on 09/07/2017 11/20/16   [name], MD  chlordiazePOXIDE (LIBRIUM) 25 MG capsule 50mg  PO TID x 1D, then 25-50mg  PO BID X 1D, then 25-50mg  PO QD X 1D 09/07/17   Deno Etienne, DO  nitroGLYCERIN (NITROSTAT) 0.4 MG SL tablet Place 1 tablet (0.4 mg total) under the tongue every 5 (five) minutes as needed for chest pain. 05/11/16   [name], MD  omeprazole (PRILOSEC) 20 MG capsule Take 1 capsule (20 mg total) by mouth daily. Patient not taking: Reported on 09/07/2017 08/16/17   Palumbo, April, MD  ondansetron [facility] ODT) 4 MG disintegrating tablet 4mg  ODT q4 hours prn nausea/vomit 09/07/17   Deno Etienne, DO  PRESCRIPTION MEDICATION Inhale into the lungs at bedtime. CPAP    [provider]    Family History Family History  Problem Relation Age of Onset  . Breast cancer Mother        bilateral; ages 66 and 75; TAH/BSO ~50  . Depression Sister   . Heart disease Father   . Hypertension Father   . Heart attack  Father   . Colon cancer Neg Hx   . Esophageal cancer Neg Hx   . Pancreatic cancer Neg Hx   . Rectal cancer Neg Hx   . Stomach cancer Neg Hx     Social History Social History   Tobacco Use  . Smoking status: Never Smoker  . Smokeless tobacco: Never Used  Substance Use Topics  . Alcohol use: Yes    Alcohol/week: 0.0 oz    Comment: Wine tonight with dinner .  [name] Drug use: Yes    Types: Marijuana    Comment: "tried marijuana a few times in college"     Allergies   Depakote [divalproex sodium]; Diazepam; Valium; and Tizanidine hcl   Review of Systems Review of Systems  Constitutional: Negative for chills and fever.  HENT: Negative for congestion and rhinorrhea.   Eyes: Negative for redness and visual disturbance.  Respiratory: Negative for shortness of breath and wheezing.   Cardiovascular: Negative for chest pain and palpitations.  Gastrointestinal: Positive for abdominal pain, nausea and vomiting.  Genitourinary: Negative for dysuria and urgency.  Musculoskeletal: Negative for arthralgias and myalgias.  Skin: Negative for pallor and wound.  Neurological: Positive for tremors and headaches. Negative for dizziness.     Physical Exam Updated Vital Signs BP 129/75 (BP Location: Left Arm)   Pulse 76   Temp 98.1 F (36.7 C) (Oral)   Resp 18   SpO2 95%   Physical Exam  Constitutional: She is oriented to person, place, and time. She appears well-developed and well-nourished. No distress.  HENT:  Head: Normocephalic and atraumatic.  Eyes: Pupils are equal, round, and reactive to light. EOM are normal.  Neck: Normal range of motion. Neck supple.  Cardiovascular: Normal rate and regular rhythm. Exam reveals no gallop and no friction rub.  No murmur heard. Pulmonary/Chest: Effort normal. She has no wheezes. She has no rales.  Abdominal: Soft. She exhibits no distension and no mass. There is tenderness (epigastric). There is no guarding.  Musculoskeletal: She exhibits no  edema or tenderness.  Neurological: She is alert and oriented to person, place, and time.  Skin: Skin is warm and dry. She is not diaphoretic.  Psychiatric: She has a normal mood and affect. Her behavior is normal.  Nursing note and vitals reviewed.    ED Treatments / Results  Labs (all labs ordered are listed, but only abnormal results are displayed) Labs Reviewed  CBC WITH DIFFERENTIAL/PLATELET - Abnormal; Notable for the following components:      Result Value   RDW 15.6 (*)    Lymphs Abs 0.6 (*)    All other components within normal limits  COMPREHENSIVE METABOLIC PANEL - Abnormal; Notable for the following components:   Chloride 96 (*)    Glucose, Bld 144 (*)    Calcium 8.7 (*)    AST 53 (*)    Total Bilirubin 1.7 (*)    Anion gap 16 (*)    All other components within normal limits  CBG MONITORING, ED - Abnormal; Notable for the following components:   Glucose-Capillary 141 (*)    All other components within normal limits  LIPASE, BLOOD    EKG None  Radiology Ct Head Wo Contrast  Result Date: 09/07/2017 CLINICAL DATA:  Tremors with nausea and vomiting and dizziness. EXAM: CT HEAD WITHOUT CONTRAST TECHNIQUE: Contiguous axial images were obtained from the base of the skull through the vertex without intravenous contrast. COMPARISON:  10/19/2015 FINDINGS: Brain: Fine detail in much of the right supratentorial  brain is obscured by streak artifact from the cochlear implant device. Within this limitation, no gross hemorrhage is evident. No midline shift. No definite CT evidence for acute ischemia. Diffuse loss of parenchymal volume is consistent with atrophy. Patchy low attenuation in the deep hemispheric and periventricular white matter is nonspecific, but likely reflects chronic microvascular ischemic demyelination. Vascular: No hyperdense vessel or unexpected calcification. Skull: No evidence for fracture. No worrisome lytic or sclerotic lesion. Sinuses/Orbits: Trace air-fluid  level noted left maxillary sinus. The remaining visualized paranasal sinuses and left mastoid air cells are clear. Surgical changes are noted in the right mastoid air cells. Visualized portions of the globes and intraorbital fat are unremarkable. Other: None. IMPRESSION: 1. No gross acute intracranial abnormality. Streak artifact from the right cochlear implant device obscures much of the right supratentorial brain, limiting assessment. 2. Atrophy with chronic small vessel white matter ischemic disease. 3. Trace fluid in the left maxillary sinus. Component of acute sinusitis not excluded. Electronically Signed   By: Misty Stanley M.D.   On: 09/07/2017 18:35    Procedures Procedures (including critical care time)  Medications Ordered in ED Medications  sodium chloride 0.9 % bolus 1,000 mL (0 mLs Intravenous Stopped 09/07/17 1801)  ondansetron (ZOFRAN) injection 4 mg (4 mg Intravenous Given 09/07/17 1729)  morphine 4 MG/ML injection 4 mg (4 mg Intravenous Given 09/07/17 1729)  LORazepam (ATIVAN) injection 1 mg (1 mg Intravenous Given 09/07/17 1729)  chlordiazePOXIDE (LIBRIUM) capsule 100 mg (100 mg Oral Given 09/07/17 1729)  thiamine (VITAMIN B-1) tablet 100 mg (100 mg Oral Given 09/07/17 1747)  prochlorperazine (COMPAZINE) injection 5 mg (5 mg Intravenous Given 09/07/17 1902)     Initial Impression / Assessment and Plan / ED Course  I have reviewed the triage vital signs and the nursing notes.  Pertinent labs & imaging results that were available during my care of the patient were reviewed by me and considered in my medical decision making (see chart for details).     57 yo F with a chief complaint of abdominal pain nausea and vomiting.  This is caused her to not be able to drink alcohol today and she had some shaking.  She also struck her head a couple days ago.  Will obtain a CT of the head will obtain abdominal labs give IV fluids pain and nausea meds and benzos and reassess.  CT negative, labs  reassuring, lipase normal.  Patient is feeling better she is tolerating by mouth.  Discharge home.  8:15 PM:  I have discussed the diagnosis/risks/treatment options with the patient and believe the pt to be eligible for discharge home to follow-up with PCP. We also discussed returning to the ED immediately if new or worsening sx occur. We discussed the sx which are most concerning (e.g., sudden worsening pain, fever, inability to tolerate by mouth) that necessitate immediate return. Medications administered to the patient during their visit and any new prescriptions provided to the patient are listed below.  Medications given during this visit Medications  sodium chloride 0.9 % bolus 1,000 mL (0 mLs Intravenous Stopped 09/07/17 1801)  ondansetron (ZOFRAN) injection 4 mg (4 mg Intravenous Given 09/07/17 1729)  morphine 4 MG/ML injection 4 mg (4 mg Intravenous Given 09/07/17 1729)  LORazepam (ATIVAN) injection 1 mg (1 mg Intravenous Given 09/07/17 1729)  chlordiazePOXIDE (LIBRIUM) capsule 100 mg (100 mg Oral Given 09/07/17 1729)  thiamine (VITAMIN B-1) tablet 100 mg (100 mg Oral Given 09/07/17 1747)  prochlorperazine (COMPAZINE) injection 5 mg (5  mg Intravenous Given 09/07/17 1902)    Labs reviewed cbc with nml hgb, no leukocytosis, cmp with no lft elevation, lipase normal  Old records reviewed multiple visits to ED for alcohol abuse, epigastric abdominal pain.   The patient appears reasonably screen and/or stabilized for discharge and I doubt any other medical condition or other [facility] requiring further screening, evaluation, or treatment in the ED at this time prior to discharge.    Final Clinical Impressions(s) / ED Diagnoses   Final diagnoses:  Alcohol abuse  Epigastric abdominal pain  Hematoma of occipital surface of head, initial encounter    ED Discharge Orders        Ordered    chlordiazePOXIDE (LIBRIUM) 25 MG capsule     09/07/17 1857    ondansetron (ZOFRAN ODT) 4 MG disintegrating tablet       09/07/17 1857       Deno Etienne, DO 09/07/17 2015

## 2017-09-07 NOTE — ED Notes (Signed)
Patient transported to CT 

## 2017-09-11 ENCOUNTER — Encounter (HOSPITAL_BASED_OUTPATIENT_CLINIC_OR_DEPARTMENT_OTHER): Payer: Self-pay | Admitting: Emergency Medicine

## 2017-09-11 ENCOUNTER — Emergency Department (HOSPITAL_BASED_OUTPATIENT_CLINIC_OR_DEPARTMENT_OTHER): Payer: Medicare HMO

## 2017-09-11 ENCOUNTER — Other Ambulatory Visit: Payer: Self-pay

## 2017-09-11 ENCOUNTER — Emergency Department (HOSPITAL_BASED_OUTPATIENT_CLINIC_OR_DEPARTMENT_OTHER)
Admission: EM | Admit: 2017-09-11 | Discharge: 2017-09-11 | Disposition: A | Discharge status: Home / Self Care | Payer: Medicare HMO | Attending: Emergency Medicine | Admitting: Emergency Medicine

## 2017-09-11 DIAGNOSIS — Z79899 Other long term (current) drug therapy: Secondary | ICD-10-CM | POA: Diagnosis not present

## 2017-09-11 DIAGNOSIS — T148XXA Other injury of unspecified body region, initial encounter: Secondary | ICD-10-CM | POA: Diagnosis not present

## 2017-09-11 DIAGNOSIS — R51 Headache: Secondary | ICD-10-CM | POA: Diagnosis not present

## 2017-09-11 DIAGNOSIS — Y929 Unspecified place or not applicable: Secondary | ICD-10-CM | POA: Insufficient documentation

## 2017-09-11 DIAGNOSIS — S4992XA Unspecified injury of left shoulder and upper arm, initial encounter: Secondary | ICD-10-CM | POA: Diagnosis not present

## 2017-09-11 DIAGNOSIS — S40012A Contusion of left shoulder, initial encounter: Secondary | ICD-10-CM

## 2017-09-11 DIAGNOSIS — W19XXXA Unspecified fall, initial encounter: Secondary | ICD-10-CM | POA: Diagnosis not present

## 2017-09-11 DIAGNOSIS — S0990XA Unspecified injury of head, initial encounter: Secondary | ICD-10-CM | POA: Diagnosis not present

## 2017-09-11 DIAGNOSIS — R0781 Pleurodynia: Secondary | ICD-10-CM | POA: Diagnosis not present

## 2017-09-11 DIAGNOSIS — I2510 Atherosclerotic heart disease of native coronary artery without angina pectoris: Secondary | ICD-10-CM | POA: Diagnosis not present

## 2017-09-11 DIAGNOSIS — S299XXA Unspecified injury of thorax, initial encounter: Secondary | ICD-10-CM | POA: Diagnosis not present

## 2017-09-11 DIAGNOSIS — S199XXA Unspecified injury of neck, initial encounter: Secondary | ICD-10-CM | POA: Diagnosis not present

## 2017-09-11 DIAGNOSIS — Y999 Unspecified external cause status: Secondary | ICD-10-CM | POA: Insufficient documentation

## 2017-09-11 DIAGNOSIS — S46912A Strain of unspecified muscle, fascia and tendon at shoulder and upper arm level, left arm, initial encounter: Secondary | ICD-10-CM | POA: Diagnosis not present

## 2017-09-11 DIAGNOSIS — Y939 Activity, unspecified: Secondary | ICD-10-CM | POA: Diagnosis not present

## 2017-09-11 DIAGNOSIS — I252 Old myocardial infarction: Secondary | ICD-10-CM | POA: Insufficient documentation

## 2017-09-11 DIAGNOSIS — M542 Cervicalgia: Secondary | ICD-10-CM | POA: Diagnosis not present

## 2017-09-11 DIAGNOSIS — M25512 Pain in left shoulder: Secondary | ICD-10-CM | POA: Diagnosis not present

## 2017-09-11 MED ORDER — CYCLOBENZAPRINE HCL 10 MG PO TABS
10.0000 mg | ORAL_TABLET | Freq: Once | ORAL | Status: AC
  Administered 2017-09-11: 10 mg via ORAL
  Filled 2017-09-11: qty 1

## 2017-09-11 MED ORDER — CYCLOBENZAPRINE HCL 10 MG PO TABS: 10.0000 mg | ORAL_TABLET | Freq: Two times a day (BID) | ORAL | 0 refills | Status: AC | PRN

## 2017-09-11 MED ORDER — KETOROLAC TROMETHAMINE 15 MG/ML IJ SOLN
15.0000 mg | Freq: Once | INTRAMUSCULAR | Status: AC
  Administered 2017-09-11: 15 mg via INTRAVENOUS
  Filled 2017-09-11: qty 1

## 2017-09-11 MED ORDER — HYDROCODONE-ACETAMINOPHEN 5-325 MG PO TABS
1.0000 | ORAL_TABLET | Freq: Once | ORAL | Status: AC
  Administered 2017-09-11: 1 via ORAL
  Filled 2017-09-11: qty 1

## 2017-09-11 MED ORDER — LORAZEPAM 2 MG/ML IJ SOLN
1.0000 mg | Freq: Once | INTRAMUSCULAR | Status: AC
  Administered 2017-09-11: 1 mg via INTRAVENOUS
  Filled 2017-09-11: qty 1

## 2017-09-11 NOTE — ED Notes (Signed)
ED Provider at bedside. 

## 2017-09-11 NOTE — ED Provider Notes (Signed)
[facility]PART[facility] Provider Note   CSN: [id number] Arrival date & time: 09/11/17  1132     History   Chief Complaint Chief Complaint  Patient presents with  . Fall  . Alcohol Problem    HPI [name] is a 57 y.o. female.  Patient is a 57 year old female with a history of accidental drug overdose, atherosclerosis, cardiomyopathy, coronary artery disease, ongoing alcohol abuse and anemia who is presenting today with another fall.  Patient has a history of binge drinking which she has been doing for the last several days with vodka.  She states she must of fallen in the middle of the night but when she woke up this morning she was having a headache, neck and shoulder pain as well as pain in her right ribs.  This pain is not improving and she is now feeling like she is going through withdrawals with shakes.  Patient was seen for similar symptoms 4 days ago and also at the end of April.  Patient denies any shortness of breath, abdominal pain, nausea or vomiting.  She is able to ambulate.  She denies any numbness or tingling in her upper or lower extremities.  She does not take anticoagulation.  She has not had cough, fever.  The history is provided by the patient.  Fall  This is a recurrent problem.  Alcohol Problem     Past Medical History:  Diagnosis Date  . Accidental drug overdose 10/26/2015  . Acute respiratory failure with hypoxia ([place]) 10/23/2015  . Allergy    SEASONAL  . Anemia   . Anemia, iron deficiency 03/29/2014  . Aortic atherosclerosis ([place])   . Arthritis    "hips, shoulders, hands" (11/17/2016)  . ASD (atrial septal defect)    a. s/p ASD repair in 1973  . Aspiration pneumonia of right lower lobe due to gastric secretions ([place])   . Bronchitis 09/20/2013  . CAD (coronary artery disease)    a. 12/2015: NSTEMI occurring after cardiac arrest secondary to aspiration PNA. Echo w/ EF of 55-60%, no WMA. Outpt ischemic eval needed.  . Cardiac  arrest ([place]) 12/22/2015  . Cardiomyopathy   . Chronic bronchitis ([place])   . Chronic lower back pain   . Deaf    "not totally deaf; read lips and can hear some" (11/17/2016)  . Depression   . Diverticulosis   . Dysphagia    due to esophageal infections.  . Fibromyalgia   . Gastritis   . GERD (gastroesophageal reflux disease)   . Hearing impairment   . Hepatic steatosis   . High cholesterol   . History of hiatal hernia   . History of kidney stones   . Hypertension    [name], take htn medication to regulate heart beat.  . Increased anion gap metabolic acidosis 5/00/9381  . Lactic acidosis 12/17/2015  . Major depressive disorder, recurrent episode, moderate ([place]) 09/18/2014  . Meningitis spinal 1967  . Migraine    "all through my life" (11/17/2016)  . Nausea & vomiting 05/20/2014  . Neurocardiogenic syncope 07/19/2016  . Osteoporosis   . Pancytopenia ([place])   . PMB (postmenopausal bleeding) 02/26/2013  . Pneumonia    "several times" (11/17/2016)  . Pressure ulcer 12/18/2015  . 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 Pulmonary embolism ([place]) 2003   occured post c-section of her daughter  . Shock ([place]) 10/19/2015  .  Sleep apnea   . Starvation ketoacidosis 12/17/2015  . Vitamin D deficiency     Patient Active Problem List   Diagnosis Date Noted  . Fall at home, initial encounter 08/27/2017  . Fall 08/27/2017  . Chronic pancreatitis due to acute alcohol intoxication ([place]) 11/17/2016  . Candida infection, esophageal ([place])   . Esophageal ring, acquired   . Abdominal pain, epigastric   . Bradycardia   . Hypokalemia   . NSTEMI (non-ST elevated myocardial infarction) ([place]) 12/19/2015  . Typical atrial flutter ([place])   . Protein-calorie malnutrition, severe 12/18/2015  . Alcohol abuse 12/17/2015  . Essential hypertension 12/06/2015  . Non-traumatic compression fracture of  vertebral column with routine healing 07/17/2014  . Osteoporosis 07/17/2014  . Odynophagia 07/09/2014  . Esophageal dysphagia 07/08/2014  . Chest pain 05/20/2014  . 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 Hypercholesterolemia without hypertriglyceridemia 03/29/2014  . Fatigue 03/29/2014  . Classical migraine with intractable migraine 03/29/2014  . Disorder of mitral valve 03/29/2014  . Atypical migraine 03/29/2014  . Atrial septal defect of fossa ovalis 03/29/2014  . Arthralgia of multiple joints 03/29/2014  . Fibrositis 03/29/2014  . Abdominal pain 12/20/2013  . Absolute anemia 09/20/2013  . Family history of breast cancer 07/13/2013  . Palpitation 07/02/2013  . History of mitral valve repair 07/02/2013  . Status post patch closure of ASD 07/02/2013  . Hyperlipidemia 07/02/2013  . OAB (overactive bladder) 06/13/2013  . Ovarian cyst, left 03/15/2013  . Vaginal lesion 03/15/2013  . Ovarian mass 03/15/2013  . Maternal DVT (deep vein thrombosis), history of 02/26/2013  . Alcohol dependence with uncomplicated withdrawal ([place]) 11/07/2012  . Unspecified vitamin D deficiency 08/17/2012  . L1 vertebral fracture ([place]) 08/01/2012  . Spinal compression fracture ([place]) 07/12/2012  . Menopausal state 07/12/2012  . Psoriasis 07/12/2012  . Severe episode of recurrent major depressive disorder ([place]) 12/07/2011  . Cardiomyopathy 03/16/2011  . Hearing impairment   . Arthritis     Past Surgical History:  Procedure Laterality Date  . APPENDECTOMY    . ATRIAL SEPTAL DEFECT(ASD) CLOSURE  1973  . [place]  . CESAREAN SECTION  2003  . COCHLEAR IMPLANT Right   . ESOPHAGOGASTRODUODENOSCOPY (EGD) WITH PROPOFOL N/A 07/20/2016   Procedure: ESOPHAGOGASTRODUODENOSCOPY (EGD) WITH PROPOFOL;  Surgeon: Gatha Mayer, MD;  Location: [facility];  Service: Endoscopy;  Laterality: N/A;  . EYE MUSCLE SURGERY Bilateral   . INCISION AND DRAINAGE OF  WOUND  1993   S/P c-section  . LAPAROSCOPIC CHOLECYSTECTOMY  2012  . MITRAL VALVE REPAIR  05/2005  . TONSILLECTOMY AND ADENOIDECTOMY    . TUBAL LIGATION  1993  . TYMPANOSTOMY TUBE PLACEMENT Bilateral "lots"     OB History    Gravida  2   Para  2   Term  2   Preterm  0   AB  0   Living        SAB  0   TAB  0   Ectopic  0   Multiple      Live Births               Home Medications    Prior to Admission medications   Medication Sig Start Date End Date Taking? Authorizing Provider  acetaminophen (TYLENOL) 325 MG tablet Take 2 tablets (650 mg total) by mouth every 6 (six) hours as needed for mild pain (or Fever >/= 101). Patient not taking: Reported on 09/07/2017 11/20/16   [name],  MD  acetaminophen (TYLENOL) 325 MG tablet Take 650 mg by mouth every 6 (six) hours as needed for moderate pain.    [provider]  Ascorbic Acid (VITAMIN C) 1000 MG tablet Take 1,000 mg by mouth daily.    [provider]  aspirin 81 MG chewable tablet Chew 81 mg by mouth daily.    [provider]  atorvastatin (LIPITOR) 40 MG tablet Take 1 tablet (40 mg total) by mouth daily at 6 PM. 03/09/17   [name], MD  chlordiazePOXIDE (LIBRIUM) 25 MG capsule 50mg  PO TID x 1D, then 25-50mg  PO BID X 1D, then 25-50mg  PO QD X 1D 09/07/17   Deno Etienne, DO  Cholecalciferol (VITAMIN D3) 2000 units TABS Take 4,000 Units by mouth daily.     [provider]  fexofenadine (ALLEGRA ALLERGY) 180 MG tablet Take 180 mg by mouth daily.    [provider]  fluticasone (FLONASE) 50 MCG/ACT nasal spray Place 2 sprays into both nostrils daily.    [provider]  folic acid (FOLVITE) 1 MG tablet Take 1 tablet (1 mg total) by mouth daily. 08/28/17   Elgergawy, Silver Huguenin, MD  lipase/protease/amylase (CREON) 36000 UNITS CPEP capsule Take 1 capsule (36,000 Units total) by mouth 3 (three) times daily before meals. 02/16/17   [name], MD  magnesium 30 MG tablet  Take 1 tablet (30 mg total) by mouth 2 (two) times daily. 08/28/17   Elgergawy, Silver Huguenin, MD  metoCLOPramide (REGLAN) 5 MG/5ML solution Take 5 mg by mouth 4 (four) times daily.    [provider]  metoprolol tartrate (LOPRESSOR) 25 MG tablet Take 1 tablet (25 mg total) by mouth 2 (two) times daily. 03/09/17   [name], MD  mirabegron ER (MYRBETRIQ) 50 MG TB24 tablet Take 1 tablet (50 mg total) by mouth daily. 04/30/17   [name], MD  nitroGLYCERIN (NITROSTAT) 0.4 MG SL tablet Place 1 tablet (0.4 mg total) under the tongue every 5 (five) minutes as needed for chest pain. 05/11/16   [name], MD  Omega-3 Fatty Acids (FISH OIL) 1000 MG CAPS Take 1,000 mg by mouth daily.     [provider]  omeprazole (PRILOSEC) 20 MG capsule Take 1 capsule (20 mg total) by mouth daily. Patient not taking: Reported on 09/07/2017 08/16/17   Palumbo, April, MD  omeprazole (PRILOSEC) 40 MG capsule Take 40 mg by mouth daily.    [provider]  ondansetron (ZOFRAN ODT) 4 MG disintegrating tablet 4mg  ODT q4 hours prn nausea/vomit 09/07/17   Deno Etienne, DO  prazosin (MINIPRESS) 2 MG capsule Take 2 mg by mouth at bedtime.  02/17/16   [provider]  Prenatal Multivit-Min-Fe-FA (PRENATAL VITAMINS PO) Take 1 tablet by mouth daily.    [provider]  PRESCRIPTION MEDICATION Inhale into the lungs at bedtime. CPAP    [provider]  thiamine 100 MG tablet Take 1 tablet (100 mg total) by mouth daily. 08/28/17   Elgergawy, Silver Huguenin, MD  Vilazodone HCl (VIIBRYD) 40 MG TABS Take 40 mg by mouth daily.     [provider]  vitamin B-12 (CYANOCOBALAMIN) 500 MCG tablet Take 500 mcg by mouth daily.    [provider]    Family History Family History  Problem Relation Age of Onset  . Breast cancer Mother        bilateral; ages 61 and 37; TAH/BSO ~50  . Depression Sister   . Heart disease Father   .  Hypertension Father   . Heart attack Father   . Colon cancer  Neg Hx   . Esophageal cancer Neg Hx   . Pancreatic cancer Neg Hx   . Rectal cancer Neg Hx   . Stomach cancer Neg Hx     Social History Social History   Tobacco Use  . Smoking status: Never Smoker  . Smokeless tobacco: Never Used  Substance Use Topics  . Alcohol use: Yes    Alcohol/week: 0.0 oz    Comment: Wine tonight with dinner .  [name] Drug use: Yes    Types: Marijuana    Comment: "tried marijuana a few times in college"     Allergies   Depakote [divalproex sodium]; Diazepam; Valium; and Tizanidine hcl   Review of Systems Review of Systems  All other systems reviewed and are negative.    Physical Exam Updated Vital Signs BP (!) 161/93 (BP Location: Right Arm)   Pulse (!) 108   Temp 98.8 F (37.1 C) (Oral)   Resp 20   Ht 5\' 4"  (1.626 m)   Wt 55.3 kg (122 lb)   SpO2 99%   BMI 20.94 kg/m   Physical Exam  Constitutional: She is oriented to person, place, and time. She appears well-developed and well-nourished. She appears distressed.  HENT:  Head: Normocephalic and atraumatic.  Eyes: Pupils are equal, round, and reactive to light. EOM are normal.  Neck: Muscular tenderness present. No spinous process tenderness present.    Currently in c-collar  Cardiovascular: Normal rate, regular rhythm, normal heart sounds and intact distal pulses. Exam reveals no friction rub.  No murmur heard. Pulmonary/Chest: Effort normal and breath sounds normal. She has no wheezes. She has no rales.  Abdominal: Soft. Bowel sounds are normal. She exhibits no distension. There is no tenderness. There is no rebound and no guarding.  Musculoskeletal:       Left shoulder: She exhibits tenderness and bony tenderness. She exhibits normal range of motion.       Arms: No edema.  Scattered ecchymosis over the upper and lower extremities in various stages of healing.    Neurological: She is alert and oriented to person, place, and time. She has normal strength. No cranial nerve deficit or  sensory deficit. Coordination and gait normal.  Upper extremity tremors.  No tongue fasciculations  Skin: Skin is warm and dry. No rash noted.  Psychiatric: She has a normal mood and affect. Her behavior is normal.  Nursing note and vitals reviewed.    ED Treatments / Results  Labs (all labs ordered are listed, but only abnormal results are displayed) Labs Reviewed - No data to display  EKG None  Radiology Dg Ribs Unilateral W/chest Right  Result Date: 09/11/2017 CLINICAL DATA:  Tremors.  Fall last night.  Right lateral rib pain. EXAM: RIGHT RIBS AND CHEST - 3+ VIEW COMPARISON:  Chest radiographs 04/13/2016. FINDINGS: The heart size and mediastinal contours are stable status post median sternotomy and mitral valve surgery. The lungs appear clear. There is no pleural effusion or pneumothorax. Metallic BB was placed over the area of pain laterally on the right. There is mild cortical regularity of several right-sided ribs anteriorly, best seen on the oblique view. This appearance is nonspecific and could be secondary to old rib fractures. More clearly old rib fractures are seen on the left. IMPRESSION: Right rib deformities consistent with fractures, age indeterminate. Radiographically, these are not suspected to be acute. No displaced acute fracture, pleural effusion or pneumothorax. Electronically  Signed   By: Richardean Sale M.D.   On: 09/11/2017 13:57   Ct Head Wo Contrast  Result Date: 09/11/2017 CLINICAL DATA:  Tremors with headache and neck pain.  Recent fall. EXAM: CT HEAD WITHOUT CONTRAST CT CERVICAL SPINE WITHOUT CONTRAST TECHNIQUE: Multidetector CT imaging of the head and cervical spine was performed following the standard protocol without intravenous contrast. Multiplanar CT image reconstructions of the cervical spine were also generated. COMPARISON:  CT head 09/07/2017.  CT cervical spine 09/04/2015. FINDINGS: CT HEAD FINDINGS Brain: There is no evidence of acute intracranial  hemorrhage, mass lesion, brain edema or extra-axial fluid collection. There is generalized atrophy with prominence of the ventricles and subarachnoid spaces. There is extensive confluent and patchy low-density in the periventricular and subcortical white matter bilaterally, similar to the prior study and likely due to advanced small vessel ischemic changes. There is no CT evidence of acute cortical infarction. Vascular: Intracranial vascular calcifications. No hyperdense vessel identified. Skull: Negative for fracture or focal lesion. Sinuses/Orbits: Stable postsurgical changes in the right temporal bone related to cochlear implant. The visualized paranasal sinuses and mastoid air cells are clear. No orbital abnormalities are seen. 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 CT CERVICAL SPINE FINDINGS Alignment: Stable.  There is a slight retrolisthesis at C4-5. Skull base and vertebrae: No evidence of acute fracture or traumatic subluxation. There is multilevel spondylosis, greatest at C4-5 and C5-6. Probable interbody and bilateral facet joint ankylosis at C2-3. The right C3-4 facet joint appears ankylosed. Soft tissues and spinal canal: No prevertebral fluid or swelling. No visible canal hematoma. Disc levels: No large disc herniation or spinal stenosis. Multilevel spondylosis, similar to previous study. Upper chest: No acute findings. Other: None. IMPRESSION: 1. No acute intracranial findings. 2. Atrophy and chronic small vessel ischemic changes in the periventricular and subcortical white matter. 3. No evidence of acute cervical spine fracture, traumatic subluxation or static signs of instability. 4. Stable multilevel spondylosis. Electronically Signed   By: Richardean Sale M.D.   On: 09/11/2017 12:57   Ct Cervical Spine Wo Contrast  Result Date: 09/11/2017 CLINICAL DATA:  Tremors with headache and neck pain.  Recent fall. EXAM: CT HEAD WITHOUT CONTRAST CT CERVICAL SPINE  WITHOUT CONTRAST TECHNIQUE: Multidetector CT imaging of the head and cervical spine was performed following the standard protocol without intravenous contrast. Multiplanar CT image reconstructions of the cervical spine were also generated. COMPARISON:  CT head 09/07/2017.  CT cervical spine 09/04/2015. FINDINGS: CT HEAD FINDINGS Brain: There is no evidence of acute intracranial hemorrhage, mass lesion, brain edema or extra-axial fluid collection. There is generalized atrophy with prominence of the ventricles and subarachnoid spaces. There is extensive confluent and patchy low-density in the periventricular and subcortical white matter bilaterally, similar to the prior study and likely due to advanced small vessel ischemic changes. There is no CT evidence of acute cortical infarction. Vascular: Intracranial vascular calcifications. No hyperdense vessel identified. Skull: Negative for fracture or focal lesion. Sinuses/Orbits: Stable postsurgical changes in the right temporal bone related to cochlear implant. The visualized paranasal sinuses and mastoid air cells are clear. No orbital abnormalities are seen. 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 CT CERVICAL SPINE FINDINGS Alignment: Stable.  There is a slight retrolisthesis at C4-5. Skull base and vertebrae: No evidence of acute fracture or traumatic subluxation. There is multilevel spondylosis, greatest at C4-5 and C5-6. Probable interbody and bilateral facet joint ankylosis at C2-3. The right C3-4 facet joint appears ankylosed. Soft tissues  and spinal canal: No prevertebral fluid or swelling. No visible canal hematoma. Disc levels: No large disc herniation or spinal stenosis. Multilevel spondylosis, similar to previous study. Upper chest: No acute findings. Other: None. IMPRESSION: 1. No acute intracranial findings. 2. Atrophy and chronic small vessel ischemic changes in the periventricular and subcortical white matter. 3. No  evidence of acute cervical spine fracture, traumatic subluxation or static signs of instability. 4. Stable multilevel spondylosis. Electronically Signed   By: Richardean Sale M.D.   On: 09/11/2017 12:57   Dg Shoulder Left  Result Date: 09/11/2017 CLINICAL DATA:  Status post fall.  Left shoulder pain. EXAM: LEFT SHOULDER - 2+ VIEW COMPARISON:  None. FINDINGS: There is no evidence of acute fracture or dislocation. Posttraumatic deformity of the distal clavicle. There is no evidence of arthropathy or other focal bone abnormality. Soft tissues are unremarkable. IMPRESSION: No acute fracture or dislocation identified about the left shoulder. Electronically Signed   By: Fidela Salisbury M.D.   On: 09/11/2017 13:54    Procedures Procedures (including critical care time)  Medications Ordered in ED Medications  LORazepam (ATIVAN) injection 1 mg (1 mg Intravenous Given 09/11/17 1239)  cyclobenzaprine (FLEXERIL) tablet 10 mg (10 mg Oral Given 09/11/17 1246)  HYDROcodone-acetaminophen (NORCO/VICODIN) 5-325 MG per tablet 1 tablet (1 tablet Oral Given 09/11/17 1258)     Initial Impression / Assessment and Plan / ED Course  I have reviewed the triage vital signs and the nursing notes.  Pertinent labs & imaging results that were available during my care of the patient were reviewed by me and considered in my medical decision making (see chart for details).     57 year old female with a known history of alcohol abuse who is been binge drinking and fell last night.  Patient is complaining of head neck and mostly left shoulder pain.  She is also complaining of pain in the right ribs.  She is in no acute distress at this time.  She is mildly tachycardic and tremulous concern for possible withdrawal.  Oxygen saturation within normal limits.  She has no evidence of trauma to the chest or abdomen.  Patient's imaging of her head, neck, left shoulder and ribs are all without acute findings.  Left rib films do show prior  fractures. Patient was given Ativan with improvement of her symptoms.  She already has a prescription for Librium in her purse.  She was given outpatient resources as well however she is not sure she wants to stop drinking.  She was given a muscle relaxer and instructed to use Tylenol and ibuprofen for pain.   Final Clinical Impressions(s) / ED Diagnoses   Final diagnoses:  Fall, initial encounter  Contusion of left shoulder, initial encounter  Musculoskeletal strain    ED Discharge Orders        Ordered    cyclobenzaprine (FLEXERIL) 10 MG tablet  2 times daily PRN     09/11/17 1424       Blanchie Dessert, MD 09/11/17 1426

## 2017-09-11 NOTE — ED Notes (Signed)
C-collar applied due to neck pain and unable to assess the severity of the fall and related to the ETOH usaged

## 2017-09-11 NOTE — ED Triage Notes (Signed)
Today presents with tremors and states that she has been drinking vodka for the last 3 days  - reports that she had a fall sometime last night but does not remember it. The patient states that she is having generalized pain to her neck, back and shoulder

## 2017-09-11 NOTE — ED Notes (Signed)
Attempted x 2 to obtain IV access; unable to obtain.

## 2017-09-13 DIAGNOSIS — R69 Illness, unspecified: Secondary | ICD-10-CM | POA: Diagnosis not present

## 2017-09-20 ENCOUNTER — Telehealth: Payer: Self-pay | Admitting: Family Medicine

## 2017-09-20 NOTE — Telephone Encounter (Signed)
Received call from [place] [facility] to notify PCP of pt's death. Contacted Dr [name] and conference call initiated.

## 2017-09-20 NOTE — Telephone Encounter (Signed)
Received call from medical examiner [name] (his cell [phone]) - pt was found DOA on her floor next to her bed this morning- nothing suspicious, no thing alarming at the scene, no reason to suspect drug abuse (which would be the main factor that would prompt the state to want to proceed w/ further investigation/autopsy.)   He suspects death due to one of her long list of medical problems and/or EtOH OD. OK to send me death certificate for completion. He is unsure if pt's family is aware of her passing as she was just found recently but I am ok to call them since pt has signed release on her chart for her mother [name] (whom I am also PCP and know well so I will call to inform her of pt's death). Pt has 2 children - [name] and [name]. Discussed w/ pt's mother [name] - pt did not have any living will or HCPOA that she was aware of but does not think anyone in family has the $$ for burial/cremation and feels like a ceremony would be to ironic considering. She is trying to research to find out if Isidra's body can be donated to science as believes [name] would have wanted some good to come from this and her parting givt to benefit others in some way. (Also then cremation would be paid for which she does not have the $$ for currently). Advised I was unaware of how to go about this but will likely depend upon [name]'s children (or at least first born) to give the consent. Advised likely the medical examiners office (the morgue where [name]'s body currently is) would be able to provide more info on this.

## 2017-09-22 ENCOUNTER — Telehealth: Payer: Self-pay | Admitting: Family Medicine

## 2017-09-22 NOTE — Telephone Encounter (Signed)
Received death certificate from [facility] for pt on 09/22/17. They are requesting once death certificate has been completed to please call [facility] at [phone] and they will pick it up. Form placed in provider's box on 09/22/17.

## 2017-09-23 NOTE — Telephone Encounter (Signed)
Ronalee Belts with [facility] calling for status update on death certificate. Please return call.

## 2017-09-24 NOTE — Telephone Encounter (Signed)
[facility] picked up Death Certificate today.

## 2017-09-24 NOTE — Telephone Encounter (Signed)
DEATH CERTIFICATE COMPLETED and will be left at [address] front desk for pick up. Please call [facility] at [phone] asap - I'm sure that they would like this today rather than Monday. THANK YOU.

## 2017-10-08 DIAGNOSIS — 419620001 Death: Secondary | SNOMED CT | POA: Diagnosis not present

## 2017-10-08 DEATH — deceased

## 2019-02-15 ENCOUNTER — Telehealth: Payer: Self-pay

## 2019-02-15 NOTE — Telephone Encounter (Signed)
Tried to call pt to set up evisit. Neither number listed works.
